# Patient Record
Sex: Female | Born: 1940
Health system: Southern US, Community
[De-identification: ages and names within clinical notes are randomized; demographics above are authoritative.]

## PROBLEM LIST (undated history)

## (undated) DIAGNOSIS — M1990 Unspecified osteoarthritis, unspecified site: Secondary | ICD-10-CM

## (undated) DIAGNOSIS — F419 Anxiety disorder, unspecified: Secondary | ICD-10-CM

## (undated) DIAGNOSIS — M8580 Other specified disorders of bone density and structure, unspecified site: Secondary | ICD-10-CM

## (undated) DIAGNOSIS — I10 Essential (primary) hypertension: Secondary | ICD-10-CM

## (undated) DIAGNOSIS — E871 Hypo-osmolality and hyponatremia: Secondary | ICD-10-CM

## (undated) DIAGNOSIS — Z9289 Personal history of other medical treatment: Secondary | ICD-10-CM

## (undated) DIAGNOSIS — K219 Gastro-esophageal reflux disease without esophagitis: Secondary | ICD-10-CM

## (undated) DIAGNOSIS — E785 Hyperlipidemia, unspecified: Secondary | ICD-10-CM

## (undated) DIAGNOSIS — I110 Hypertensive heart disease with heart failure: Secondary | ICD-10-CM

## (undated) DIAGNOSIS — I5032 Chronic diastolic (congestive) heart failure: Secondary | ICD-10-CM

## (undated) DIAGNOSIS — I340 Nonrheumatic mitral (valve) insufficiency: Secondary | ICD-10-CM

## (undated) DIAGNOSIS — I371 Nonrheumatic pulmonary valve insufficiency: Secondary | ICD-10-CM

## (undated) DIAGNOSIS — I350 Nonrheumatic aortic (valve) stenosis: Secondary | ICD-10-CM

## (undated) HISTORY — DX: Chronic diastolic (congestive) heart failure: I50.32

## (undated) HISTORY — DX: Gastro-esophageal reflux disease without esophagitis: K21.9

## (undated) HISTORY — DX: Nonrheumatic pulmonary valve insufficiency: I37.1

## (undated) HISTORY — PX: WRIST SURGERY: SHX841

## (undated) HISTORY — DX: Hyperlipidemia, unspecified: E78.5

## (undated) HISTORY — DX: Personal history of other medical treatment: Z92.89

## (undated) HISTORY — DX: Anxiety disorder, unspecified: F41.9

## (undated) HISTORY — PX: MENISECTOMY: SHX5181

## (undated) HISTORY — DX: Hypertensive heart disease with heart failure: I11.0

## (undated) HISTORY — DX: Nonrheumatic aortic (valve) stenosis: I35.0

## (undated) HISTORY — PX: KIDNEY SURGERY: SHX687

## (undated) HISTORY — DX: Other specified disorders of bone density and structure, unspecified site: M85.80

## (undated) HISTORY — DX: Essential (primary) hypertension: I10

## (undated) HISTORY — PX: OTHER SURGICAL HISTORY: SHX169

## (undated) HISTORY — PX: KNEE SURGERY: SHX244

## (undated) HISTORY — DX: Hypo-osmolality and hyponatremia: E87.1

## (undated) HISTORY — DX: Unspecified osteoarthritis, unspecified site: M19.90

## (undated) HISTORY — DX: Nonrheumatic mitral (valve) insufficiency: I34.0

---

## 1998-04-14 ENCOUNTER — Encounter: Admission: RE | Admit: 1998-04-14 | Discharge: 1998-04-14 | Payer: Self-pay | Admitting: Family Medicine

## 1998-05-07 ENCOUNTER — Encounter: Admission: RE | Admit: 1998-05-07 | Discharge: 1998-05-07 | Payer: Self-pay | Admitting: Family Medicine

## 1998-05-26 ENCOUNTER — Encounter: Admission: RE | Admit: 1998-05-26 | Discharge: 1998-05-26 | Payer: Self-pay | Admitting: Family Medicine

## 1998-05-27 ENCOUNTER — Encounter: Admission: RE | Admit: 1998-05-27 | Discharge: 1998-05-27 | Payer: Self-pay | Admitting: Family Medicine

## 1998-05-29 ENCOUNTER — Encounter: Admission: RE | Admit: 1998-05-29 | Discharge: 1998-05-29 | Payer: Self-pay | Admitting: Family Medicine

## 1998-06-05 ENCOUNTER — Encounter: Admission: RE | Admit: 1998-06-05 | Discharge: 1998-06-05 | Payer: Self-pay | Admitting: Sports Medicine

## 1998-06-24 ENCOUNTER — Encounter: Admission: RE | Admit: 1998-06-24 | Discharge: 1998-06-24 | Payer: Self-pay | Admitting: Family Medicine

## 1998-07-01 ENCOUNTER — Encounter: Admission: RE | Admit: 1998-07-01 | Discharge: 1998-07-01 | Payer: Self-pay | Admitting: Family Medicine

## 1998-07-15 ENCOUNTER — Encounter: Admission: RE | Admit: 1998-07-15 | Discharge: 1998-07-15 | Payer: Self-pay | Admitting: Sports Medicine

## 1998-07-17 ENCOUNTER — Encounter: Admission: RE | Admit: 1998-07-17 | Discharge: 1998-07-17 | Payer: Self-pay | Admitting: Family Medicine

## 1998-07-22 ENCOUNTER — Encounter: Admission: RE | Admit: 1998-07-22 | Discharge: 1998-07-22 | Payer: Self-pay | Admitting: Family Medicine

## 1998-09-08 ENCOUNTER — Encounter: Admission: RE | Admit: 1998-09-08 | Discharge: 1998-09-08 | Payer: Self-pay | Admitting: Family Medicine

## 1998-10-08 ENCOUNTER — Encounter: Admission: RE | Admit: 1998-10-08 | Discharge: 1998-10-08 | Payer: Self-pay | Admitting: Family Medicine

## 1998-10-29 ENCOUNTER — Encounter: Admission: RE | Admit: 1998-10-29 | Discharge: 1998-10-29 | Payer: Self-pay | Admitting: Family Medicine

## 1998-12-03 ENCOUNTER — Encounter: Admission: RE | Admit: 1998-12-03 | Discharge: 1998-12-03 | Payer: Self-pay | Admitting: Family Medicine

## 1998-12-17 ENCOUNTER — Encounter: Admission: RE | Admit: 1998-12-17 | Discharge: 1998-12-17 | Payer: Self-pay | Admitting: Family Medicine

## 1999-01-30 ENCOUNTER — Encounter: Admission: RE | Admit: 1999-01-30 | Discharge: 1999-01-30 | Payer: Self-pay | Admitting: Family Medicine

## 1999-02-13 ENCOUNTER — Encounter: Admission: RE | Admit: 1999-02-13 | Discharge: 1999-02-13 | Payer: Self-pay | Admitting: Family Medicine

## 1999-02-23 ENCOUNTER — Encounter: Admission: RE | Admit: 1999-02-23 | Discharge: 1999-02-23 | Payer: Self-pay | Admitting: Family Medicine

## 1999-04-03 ENCOUNTER — Encounter: Admission: RE | Admit: 1999-04-03 | Discharge: 1999-04-03 | Payer: Self-pay | Admitting: Sports Medicine

## 1999-04-16 ENCOUNTER — Encounter: Admission: RE | Admit: 1999-04-16 | Discharge: 1999-04-16 | Payer: Self-pay | Admitting: Family Medicine

## 1999-05-13 ENCOUNTER — Encounter: Admission: RE | Admit: 1999-05-13 | Discharge: 1999-05-13 | Payer: Self-pay | Admitting: Family Medicine

## 1999-06-03 ENCOUNTER — Encounter: Admission: RE | Admit: 1999-06-03 | Discharge: 1999-06-03 | Payer: Self-pay | Admitting: Family Medicine

## 1999-09-10 ENCOUNTER — Encounter: Admission: RE | Admit: 1999-09-10 | Discharge: 1999-09-10 | Payer: Self-pay | Admitting: Family Medicine

## 1999-10-29 ENCOUNTER — Encounter: Admission: RE | Admit: 1999-10-29 | Discharge: 1999-10-29 | Payer: Self-pay | Admitting: Family Medicine

## 1999-11-18 ENCOUNTER — Encounter: Admission: RE | Admit: 1999-11-18 | Discharge: 1999-11-18 | Payer: Self-pay | Admitting: Family Medicine

## 1999-11-27 ENCOUNTER — Encounter: Admission: RE | Admit: 1999-11-27 | Discharge: 1999-11-27 | Payer: Self-pay | Admitting: Family Medicine

## 1999-12-07 ENCOUNTER — Encounter: Admission: RE | Admit: 1999-12-07 | Discharge: 1999-12-07 | Payer: Self-pay | Admitting: Family Medicine

## 2000-01-14 ENCOUNTER — Ambulatory Visit (HOSPITAL_COMMUNITY): Admission: RE | Admit: 2000-01-14 | Discharge: 2000-01-14 | Payer: Self-pay | Admitting: Family Medicine

## 2000-01-14 ENCOUNTER — Encounter: Admission: RE | Admit: 2000-01-14 | Discharge: 2000-01-14 | Payer: Self-pay | Admitting: Family Medicine

## 2000-01-25 ENCOUNTER — Encounter: Admission: RE | Admit: 2000-01-25 | Discharge: 2000-01-25 | Payer: Self-pay | Admitting: Family Medicine

## 2000-03-04 ENCOUNTER — Encounter: Admission: RE | Admit: 2000-03-04 | Discharge: 2000-03-04 | Payer: Self-pay | Admitting: Family Medicine

## 2000-03-22 ENCOUNTER — Encounter: Admission: RE | Admit: 2000-03-22 | Discharge: 2000-03-22 | Payer: Self-pay | Admitting: Sports Medicine

## 2000-03-29 ENCOUNTER — Encounter: Admission: RE | Admit: 2000-03-29 | Discharge: 2000-03-29 | Payer: Self-pay | Admitting: Family Medicine

## 2000-05-19 ENCOUNTER — Encounter: Admission: RE | Admit: 2000-05-19 | Discharge: 2000-05-19 | Payer: Self-pay | Admitting: Family Medicine

## 2000-06-24 ENCOUNTER — Encounter: Admission: RE | Admit: 2000-06-24 | Discharge: 2000-06-24 | Payer: Self-pay | Admitting: Family Medicine

## 2000-07-01 ENCOUNTER — Encounter: Admission: RE | Admit: 2000-07-01 | Discharge: 2000-07-01 | Payer: Self-pay | Admitting: Family Medicine

## 2000-08-18 ENCOUNTER — Encounter: Payer: Self-pay | Admitting: *Deleted

## 2000-08-18 ENCOUNTER — Encounter: Admission: RE | Admit: 2000-08-18 | Discharge: 2000-08-18 | Payer: Self-pay | Admitting: *Deleted

## 2000-09-20 ENCOUNTER — Encounter: Admission: RE | Admit: 2000-09-20 | Discharge: 2000-09-20 | Payer: Self-pay | Admitting: Family Medicine

## 2000-10-11 ENCOUNTER — Encounter: Admission: RE | Admit: 2000-10-11 | Discharge: 2000-10-11 | Payer: Self-pay | Admitting: Family Medicine

## 2000-11-15 ENCOUNTER — Encounter: Admission: RE | Admit: 2000-11-15 | Discharge: 2000-11-15 | Payer: Self-pay | Admitting: Family Medicine

## 2000-11-25 ENCOUNTER — Encounter: Admission: RE | Admit: 2000-11-25 | Discharge: 2000-11-25 | Payer: Self-pay | Admitting: Family Medicine

## 2001-01-16 ENCOUNTER — Encounter: Admission: RE | Admit: 2001-01-16 | Discharge: 2001-01-16 | Payer: Self-pay | Admitting: Family Medicine

## 2001-01-18 ENCOUNTER — Encounter: Admission: RE | Admit: 2001-01-18 | Discharge: 2001-01-18 | Payer: Self-pay | Admitting: Family Medicine

## 2001-02-20 ENCOUNTER — Encounter: Admission: RE | Admit: 2001-02-20 | Discharge: 2001-02-20 | Payer: Self-pay | Admitting: Family Medicine

## 2001-03-24 ENCOUNTER — Encounter: Admission: RE | Admit: 2001-03-24 | Discharge: 2001-03-24 | Payer: Self-pay | Admitting: Family Medicine

## 2001-04-28 ENCOUNTER — Encounter: Admission: RE | Admit: 2001-04-28 | Discharge: 2001-04-28 | Payer: Self-pay | Admitting: Family Medicine

## 2001-05-08 ENCOUNTER — Encounter: Admission: RE | Admit: 2001-05-08 | Discharge: 2001-05-08 | Payer: Self-pay | Admitting: Family Medicine

## 2001-07-25 ENCOUNTER — Encounter: Admission: RE | Admit: 2001-07-25 | Discharge: 2001-07-25 | Payer: Self-pay | Admitting: Family Medicine

## 2001-10-12 ENCOUNTER — Encounter: Admission: RE | Admit: 2001-10-12 | Discharge: 2001-10-12 | Payer: Self-pay | Admitting: Family Medicine

## 2001-10-19 ENCOUNTER — Encounter: Payer: Self-pay | Admitting: *Deleted

## 2001-10-19 ENCOUNTER — Encounter: Admission: RE | Admit: 2001-10-19 | Discharge: 2001-10-19 | Payer: Self-pay | Admitting: *Deleted

## 2002-01-03 ENCOUNTER — Ambulatory Visit (HOSPITAL_COMMUNITY): Admission: RE | Admit: 2002-01-03 | Discharge: 2002-01-03 | Payer: Self-pay | Admitting: Gastroenterology

## 2002-02-01 ENCOUNTER — Encounter: Admission: RE | Admit: 2002-02-01 | Discharge: 2002-02-01 | Payer: Self-pay | Admitting: Family Medicine

## 2002-02-23 ENCOUNTER — Encounter: Admission: RE | Admit: 2002-02-23 | Discharge: 2002-02-23 | Payer: Self-pay | Admitting: Family Medicine

## 2002-04-13 ENCOUNTER — Encounter: Admission: RE | Admit: 2002-04-13 | Discharge: 2002-04-13 | Payer: Self-pay | Admitting: Family Medicine

## 2002-04-19 ENCOUNTER — Ambulatory Visit (HOSPITAL_COMMUNITY): Admission: RE | Admit: 2002-04-19 | Discharge: 2002-04-19 | Payer: Self-pay | Admitting: Family Medicine

## 2002-04-19 ENCOUNTER — Encounter: Admission: RE | Admit: 2002-04-19 | Discharge: 2002-04-19 | Payer: Self-pay | Admitting: Family Medicine

## 2002-04-19 ENCOUNTER — Encounter: Payer: Self-pay | Admitting: Sports Medicine

## 2002-04-19 ENCOUNTER — Encounter: Admission: RE | Admit: 2002-04-19 | Discharge: 2002-04-19 | Payer: Self-pay | Admitting: Sports Medicine

## 2002-06-08 ENCOUNTER — Encounter: Admission: RE | Admit: 2002-06-08 | Discharge: 2002-06-08 | Payer: Self-pay | Admitting: Family Medicine

## 2002-08-13 ENCOUNTER — Encounter: Admission: RE | Admit: 2002-08-13 | Discharge: 2002-08-13 | Payer: Self-pay | Admitting: Family Medicine

## 2002-08-23 ENCOUNTER — Encounter: Admission: RE | Admit: 2002-08-23 | Discharge: 2002-08-23 | Payer: Self-pay | Admitting: Family Medicine

## 2002-09-04 ENCOUNTER — Encounter: Admission: RE | Admit: 2002-09-04 | Discharge: 2002-09-04 | Payer: Self-pay | Admitting: Family Medicine

## 2002-11-02 ENCOUNTER — Encounter: Admission: RE | Admit: 2002-11-02 | Discharge: 2002-11-02 | Payer: Self-pay | Admitting: Family Medicine

## 2002-11-17 ENCOUNTER — Emergency Department (HOSPITAL_COMMUNITY): Admission: EM | Admit: 2002-11-17 | Discharge: 2002-11-17 | Payer: Self-pay | Admitting: Emergency Medicine

## 2002-11-17 ENCOUNTER — Encounter: Payer: Self-pay | Admitting: Emergency Medicine

## 2003-04-12 ENCOUNTER — Encounter: Admission: RE | Admit: 2003-04-12 | Discharge: 2003-04-12 | Payer: Self-pay | Admitting: Family Medicine

## 2003-05-03 ENCOUNTER — Encounter: Admission: RE | Admit: 2003-05-03 | Discharge: 2003-05-03 | Payer: Self-pay | Admitting: Family Medicine

## 2003-06-07 ENCOUNTER — Encounter: Admission: RE | Admit: 2003-06-07 | Discharge: 2003-06-07 | Payer: Self-pay | Admitting: Family Medicine

## 2003-06-28 ENCOUNTER — Encounter: Admission: RE | Admit: 2003-06-28 | Discharge: 2003-06-28 | Payer: Self-pay | Admitting: Sports Medicine

## 2003-07-31 ENCOUNTER — Encounter: Admission: RE | Admit: 2003-07-31 | Discharge: 2003-07-31 | Payer: Self-pay | Admitting: Family Medicine

## 2003-08-14 ENCOUNTER — Encounter: Admission: RE | Admit: 2003-08-14 | Discharge: 2003-08-14 | Payer: Self-pay | Admitting: Family Medicine

## 2003-09-02 ENCOUNTER — Encounter: Admission: RE | Admit: 2003-09-02 | Discharge: 2003-09-02 | Payer: Self-pay | Admitting: Family Medicine

## 2003-09-03 ENCOUNTER — Encounter: Admission: RE | Admit: 2003-09-03 | Discharge: 2003-09-03 | Payer: Self-pay | Admitting: Family Medicine

## 2003-09-05 ENCOUNTER — Encounter: Admission: RE | Admit: 2003-09-05 | Discharge: 2003-09-05 | Payer: Self-pay | Admitting: Family Medicine

## 2003-09-09 ENCOUNTER — Encounter: Admission: RE | Admit: 2003-09-09 | Discharge: 2003-09-09 | Payer: Self-pay | Admitting: Sports Medicine

## 2003-09-18 ENCOUNTER — Encounter: Admission: RE | Admit: 2003-09-18 | Discharge: 2003-09-18 | Payer: Self-pay | Admitting: Family Medicine

## 2003-09-25 ENCOUNTER — Encounter: Admission: RE | Admit: 2003-09-25 | Discharge: 2003-09-25 | Payer: Self-pay | Admitting: Family Medicine

## 2003-10-03 ENCOUNTER — Encounter: Admission: RE | Admit: 2003-10-03 | Discharge: 2003-10-03 | Payer: Self-pay | Admitting: Family Medicine

## 2003-10-16 ENCOUNTER — Encounter: Admission: RE | Admit: 2003-10-16 | Discharge: 2003-10-16 | Payer: Self-pay | Admitting: Family Medicine

## 2003-10-22 ENCOUNTER — Encounter: Admission: RE | Admit: 2003-10-22 | Discharge: 2003-10-22 | Payer: Self-pay | Admitting: Sports Medicine

## 2003-11-07 ENCOUNTER — Encounter: Admission: RE | Admit: 2003-11-07 | Discharge: 2003-11-07 | Payer: Self-pay | Admitting: Family Medicine

## 2003-11-19 ENCOUNTER — Encounter: Admission: RE | Admit: 2003-11-19 | Discharge: 2003-11-19 | Payer: Self-pay | Admitting: Family Medicine

## 2003-11-26 ENCOUNTER — Encounter: Admission: RE | Admit: 2003-11-26 | Discharge: 2003-11-26 | Payer: Self-pay | Admitting: Sports Medicine

## 2003-12-05 ENCOUNTER — Encounter: Admission: RE | Admit: 2003-12-05 | Discharge: 2003-12-05 | Payer: Self-pay | Admitting: Family Medicine

## 2004-01-14 ENCOUNTER — Encounter: Admission: RE | Admit: 2004-01-14 | Discharge: 2004-01-14 | Payer: Self-pay | Admitting: Sports Medicine

## 2004-02-05 ENCOUNTER — Encounter: Admission: RE | Admit: 2004-02-05 | Discharge: 2004-02-05 | Payer: Self-pay | Admitting: Sports Medicine

## 2004-03-02 ENCOUNTER — Encounter: Admission: RE | Admit: 2004-03-02 | Discharge: 2004-03-02 | Payer: Self-pay | Admitting: Sports Medicine

## 2004-04-08 ENCOUNTER — Encounter: Admission: RE | Admit: 2004-04-08 | Discharge: 2004-04-08 | Payer: Self-pay | Admitting: Family Medicine

## 2004-05-06 ENCOUNTER — Ambulatory Visit (HOSPITAL_COMMUNITY): Admission: RE | Admit: 2004-05-06 | Discharge: 2004-05-06 | Payer: Self-pay | Admitting: Sports Medicine

## 2004-05-06 ENCOUNTER — Encounter: Admission: RE | Admit: 2004-05-06 | Discharge: 2004-05-06 | Payer: Self-pay | Admitting: Sports Medicine

## 2004-05-29 ENCOUNTER — Ambulatory Visit (HOSPITAL_COMMUNITY): Admission: RE | Admit: 2004-05-29 | Discharge: 2004-05-29 | Payer: Self-pay | Admitting: Sports Medicine

## 2004-07-20 ENCOUNTER — Encounter: Admission: RE | Admit: 2004-07-20 | Discharge: 2004-07-20 | Payer: Self-pay | Admitting: Family Medicine

## 2004-10-05 ENCOUNTER — Ambulatory Visit: Payer: Self-pay | Admitting: Family Medicine

## 2004-10-15 ENCOUNTER — Ambulatory Visit: Payer: Self-pay | Admitting: Family Medicine

## 2004-10-26 ENCOUNTER — Ambulatory Visit: Payer: Self-pay | Admitting: Sports Medicine

## 2004-11-16 ENCOUNTER — Ambulatory Visit: Payer: Self-pay | Admitting: Family Medicine

## 2004-11-30 ENCOUNTER — Ambulatory Visit: Payer: Self-pay | Admitting: Sports Medicine

## 2004-12-30 ENCOUNTER — Ambulatory Visit: Payer: Self-pay | Admitting: Family Medicine

## 2005-01-11 ENCOUNTER — Ambulatory Visit: Payer: Self-pay | Admitting: Family Medicine

## 2005-01-20 ENCOUNTER — Ambulatory Visit: Payer: Self-pay | Admitting: Family Medicine

## 2005-02-19 ENCOUNTER — Ambulatory Visit: Payer: Self-pay | Admitting: Family Medicine

## 2005-04-19 ENCOUNTER — Ambulatory Visit: Payer: Self-pay | Admitting: Family Medicine

## 2005-05-26 ENCOUNTER — Encounter: Admission: RE | Admit: 2005-05-26 | Discharge: 2005-05-26 | Payer: Self-pay | Admitting: Family Medicine

## 2005-05-26 ENCOUNTER — Ambulatory Visit: Payer: Self-pay | Admitting: Family Medicine

## 2005-07-19 ENCOUNTER — Ambulatory Visit: Payer: Self-pay | Admitting: Family Medicine

## 2005-09-17 ENCOUNTER — Encounter: Admission: RE | Admit: 2005-09-17 | Discharge: 2005-09-17 | Payer: Self-pay | Admitting: Sports Medicine

## 2005-09-20 ENCOUNTER — Ambulatory Visit: Payer: Self-pay | Admitting: Family Medicine

## 2005-09-30 ENCOUNTER — Ambulatory Visit: Payer: Self-pay | Admitting: Family Medicine

## 2005-11-05 ENCOUNTER — Encounter: Admission: RE | Admit: 2005-11-05 | Discharge: 2005-11-05 | Payer: Self-pay | Admitting: Sports Medicine

## 2005-11-24 ENCOUNTER — Encounter (INDEPENDENT_AMBULATORY_CARE_PROVIDER_SITE_OTHER): Payer: Self-pay | Admitting: *Deleted

## 2005-11-29 ENCOUNTER — Ambulatory Visit: Payer: Self-pay | Admitting: Family Medicine

## 2006-02-28 ENCOUNTER — Ambulatory Visit: Payer: Self-pay | Admitting: Family Medicine

## 2006-03-02 ENCOUNTER — Ambulatory Visit: Payer: Self-pay | Admitting: Family Medicine

## 2006-03-30 ENCOUNTER — Ambulatory Visit: Payer: Self-pay | Admitting: Family Medicine

## 2006-04-22 ENCOUNTER — Ambulatory Visit: Payer: Self-pay | Admitting: Family Medicine

## 2006-05-06 ENCOUNTER — Ambulatory Visit: Payer: Self-pay | Admitting: Family Medicine

## 2006-05-12 ENCOUNTER — Ambulatory Visit: Payer: Self-pay | Admitting: Sports Medicine

## 2006-05-27 ENCOUNTER — Ambulatory Visit: Payer: Self-pay | Admitting: Family Medicine

## 2006-06-01 ENCOUNTER — Ambulatory Visit: Payer: Self-pay | Admitting: Family Medicine

## 2006-07-29 ENCOUNTER — Ambulatory Visit: Payer: Self-pay | Admitting: Family Medicine

## 2006-09-14 ENCOUNTER — Ambulatory Visit: Payer: Self-pay | Admitting: Family Medicine

## 2006-09-20 ENCOUNTER — Ambulatory Visit: Payer: Self-pay | Admitting: Sports Medicine

## 2006-10-05 ENCOUNTER — Ambulatory Visit: Payer: Self-pay | Admitting: Family Medicine

## 2006-10-06 ENCOUNTER — Ambulatory Visit: Payer: Self-pay | Admitting: Family Medicine

## 2006-10-25 ENCOUNTER — Ambulatory Visit: Payer: Self-pay | Admitting: Family Medicine

## 2006-11-08 ENCOUNTER — Ambulatory Visit: Payer: Self-pay | Admitting: Family Medicine

## 2007-01-04 ENCOUNTER — Ambulatory Visit: Payer: Self-pay | Admitting: Sports Medicine

## 2007-02-16 DIAGNOSIS — E785 Hyperlipidemia, unspecified: Secondary | ICD-10-CM | POA: Insufficient documentation

## 2007-02-16 DIAGNOSIS — E1169 Type 2 diabetes mellitus with other specified complication: Secondary | ICD-10-CM | POA: Insufficient documentation

## 2007-02-16 DIAGNOSIS — E669 Obesity, unspecified: Secondary | ICD-10-CM

## 2007-02-16 DIAGNOSIS — E119 Type 2 diabetes mellitus without complications: Secondary | ICD-10-CM | POA: Insufficient documentation

## 2007-02-16 DIAGNOSIS — M810 Age-related osteoporosis without current pathological fracture: Secondary | ICD-10-CM

## 2007-02-16 DIAGNOSIS — K219 Gastro-esophageal reflux disease without esophagitis: Secondary | ICD-10-CM | POA: Insufficient documentation

## 2007-02-16 DIAGNOSIS — M109 Gout, unspecified: Secondary | ICD-10-CM | POA: Insufficient documentation

## 2007-02-16 DIAGNOSIS — M8580 Other specified disorders of bone density and structure, unspecified site: Secondary | ICD-10-CM | POA: Insufficient documentation

## 2007-02-16 DIAGNOSIS — M170 Bilateral primary osteoarthritis of knee: Secondary | ICD-10-CM | POA: Insufficient documentation

## 2007-02-16 DIAGNOSIS — M1990 Unspecified osteoarthritis, unspecified site: Secondary | ICD-10-CM

## 2007-02-16 DIAGNOSIS — Z780 Asymptomatic menopausal state: Secondary | ICD-10-CM | POA: Insufficient documentation

## 2007-02-16 DIAGNOSIS — N952 Postmenopausal atrophic vaginitis: Secondary | ICD-10-CM | POA: Insufficient documentation

## 2007-02-16 HISTORY — DX: Obesity, unspecified: E66.9

## 2007-02-17 ENCOUNTER — Encounter (INDEPENDENT_AMBULATORY_CARE_PROVIDER_SITE_OTHER): Payer: Self-pay | Admitting: *Deleted

## 2007-04-04 ENCOUNTER — Telehealth: Payer: Self-pay | Admitting: *Deleted

## 2007-04-12 ENCOUNTER — Telehealth (INDEPENDENT_AMBULATORY_CARE_PROVIDER_SITE_OTHER): Payer: Self-pay | Admitting: Family Medicine

## 2007-04-19 ENCOUNTER — Ambulatory Visit: Payer: Self-pay | Admitting: Family Medicine

## 2007-05-05 ENCOUNTER — Ambulatory Visit: Payer: Self-pay | Admitting: Family Medicine

## 2007-05-17 ENCOUNTER — Encounter (INDEPENDENT_AMBULATORY_CARE_PROVIDER_SITE_OTHER): Payer: Self-pay | Admitting: Family Medicine

## 2007-05-17 ENCOUNTER — Telehealth (INDEPENDENT_AMBULATORY_CARE_PROVIDER_SITE_OTHER): Payer: Self-pay | Admitting: Family Medicine

## 2007-05-18 ENCOUNTER — Encounter (INDEPENDENT_AMBULATORY_CARE_PROVIDER_SITE_OTHER): Payer: Self-pay | Admitting: *Deleted

## 2007-05-19 ENCOUNTER — Encounter (INDEPENDENT_AMBULATORY_CARE_PROVIDER_SITE_OTHER): Payer: Self-pay | Admitting: Family Medicine

## 2007-05-26 ENCOUNTER — Ambulatory Visit: Payer: Self-pay | Admitting: Sports Medicine

## 2007-05-26 DIAGNOSIS — C4490 Unspecified malignant neoplasm of skin, unspecified: Secondary | ICD-10-CM

## 2007-05-26 HISTORY — DX: Unspecified malignant neoplasm of skin, unspecified: C44.90

## 2007-06-02 ENCOUNTER — Telehealth (INDEPENDENT_AMBULATORY_CARE_PROVIDER_SITE_OTHER): Payer: Self-pay | Admitting: Family Medicine

## 2007-06-08 ENCOUNTER — Telehealth (INDEPENDENT_AMBULATORY_CARE_PROVIDER_SITE_OTHER): Payer: Self-pay | Admitting: *Deleted

## 2007-06-09 ENCOUNTER — Telehealth (INDEPENDENT_AMBULATORY_CARE_PROVIDER_SITE_OTHER): Payer: Self-pay | Admitting: *Deleted

## 2007-06-12 ENCOUNTER — Encounter (INDEPENDENT_AMBULATORY_CARE_PROVIDER_SITE_OTHER): Payer: Self-pay | Admitting: Family Medicine

## 2007-06-12 ENCOUNTER — Ambulatory Visit: Payer: Self-pay | Admitting: Sports Medicine

## 2007-06-12 LAB — CONVERTED CEMR LAB
Albumin: 4.5 g/dL (ref 3.5–5.2)
Alkaline Phosphatase: 61 units/L (ref 39–117)
BUN: 19 mg/dL (ref 6–23)
Calcium: 9.5 mg/dL (ref 8.4–10.5)
Glucose, Bld: 115 mg/dL — ABNORMAL HIGH (ref 70–99)
Potassium: 4.7 meq/L (ref 3.5–5.3)

## 2007-06-30 ENCOUNTER — Encounter (INDEPENDENT_AMBULATORY_CARE_PROVIDER_SITE_OTHER): Payer: Self-pay | Admitting: Family Medicine

## 2007-06-30 ENCOUNTER — Ambulatory Visit: Payer: Self-pay | Admitting: Family Medicine

## 2007-07-05 ENCOUNTER — Ambulatory Visit (HOSPITAL_COMMUNITY): Admission: RE | Admit: 2007-07-05 | Discharge: 2007-07-05 | Payer: Self-pay | Admitting: Family Medicine

## 2007-07-05 ENCOUNTER — Telehealth: Payer: Self-pay | Admitting: *Deleted

## 2007-07-19 ENCOUNTER — Telehealth (INDEPENDENT_AMBULATORY_CARE_PROVIDER_SITE_OTHER): Payer: Self-pay | Admitting: *Deleted

## 2007-08-02 ENCOUNTER — Encounter (INDEPENDENT_AMBULATORY_CARE_PROVIDER_SITE_OTHER): Payer: Self-pay | Admitting: Family Medicine

## 2007-08-08 ENCOUNTER — Encounter (INDEPENDENT_AMBULATORY_CARE_PROVIDER_SITE_OTHER): Payer: Self-pay | Admitting: Family Medicine

## 2007-08-10 ENCOUNTER — Telehealth: Payer: Self-pay | Admitting: *Deleted

## 2007-08-10 ENCOUNTER — Encounter (INDEPENDENT_AMBULATORY_CARE_PROVIDER_SITE_OTHER): Payer: Self-pay | Admitting: Family Medicine

## 2007-09-12 ENCOUNTER — Encounter: Payer: Self-pay | Admitting: *Deleted

## 2007-09-13 ENCOUNTER — Ambulatory Visit: Payer: Self-pay | Admitting: Family Medicine

## 2007-09-13 DIAGNOSIS — K224 Dyskinesia of esophagus: Secondary | ICD-10-CM

## 2007-09-13 HISTORY — DX: Dyskinesia of esophagus: K22.4

## 2007-09-13 LAB — CONVERTED CEMR LAB: Hgb A1c MFr Bld: 5.6 %

## 2007-10-09 ENCOUNTER — Ambulatory Visit: Payer: Self-pay | Admitting: Family Medicine

## 2007-10-09 ENCOUNTER — Encounter (INDEPENDENT_AMBULATORY_CARE_PROVIDER_SITE_OTHER): Payer: Self-pay | Admitting: Family Medicine

## 2007-10-09 LAB — CONVERTED CEMR LAB
CO2: 28 meq/L (ref 19–32)
Calcium: 9.9 mg/dL (ref 8.4–10.5)
Creatinine, Ser: 0.86 mg/dL (ref 0.40–1.20)
Glucose, Bld: 139 mg/dL — ABNORMAL HIGH (ref 70–99)
HCT: 40.8 % (ref 36.0–46.0)
MCHC: 34.6 g/dL (ref 30.0–36.0)
MCV: 90.1 fL (ref 78.0–100.0)
Platelets: 262 10*3/uL (ref 150–400)
RBC: 4.53 M/uL (ref 3.87–5.11)
WBC: 7 10*3/uL (ref 4.0–10.5)

## 2007-10-10 ENCOUNTER — Encounter (INDEPENDENT_AMBULATORY_CARE_PROVIDER_SITE_OTHER): Payer: Self-pay | Admitting: Family Medicine

## 2007-11-01 ENCOUNTER — Encounter (INDEPENDENT_AMBULATORY_CARE_PROVIDER_SITE_OTHER): Payer: Self-pay | Admitting: Family Medicine

## 2007-11-15 ENCOUNTER — Telehealth: Payer: Self-pay | Admitting: *Deleted

## 2007-11-15 ENCOUNTER — Ambulatory Visit: Payer: Self-pay | Admitting: Family Medicine

## 2007-11-22 ENCOUNTER — Telehealth (INDEPENDENT_AMBULATORY_CARE_PROVIDER_SITE_OTHER): Payer: Self-pay | Admitting: Family Medicine

## 2007-12-08 ENCOUNTER — Telehealth (INDEPENDENT_AMBULATORY_CARE_PROVIDER_SITE_OTHER): Payer: Self-pay | Admitting: Family Medicine

## 2008-01-12 ENCOUNTER — Ambulatory Visit: Payer: Self-pay | Admitting: Family Medicine

## 2008-02-05 ENCOUNTER — Telehealth: Payer: Self-pay | Admitting: *Deleted

## 2008-02-06 ENCOUNTER — Emergency Department (HOSPITAL_COMMUNITY): Admission: EM | Admit: 2008-02-06 | Discharge: 2008-02-06 | Payer: Self-pay | Admitting: Emergency Medicine

## 2008-02-13 ENCOUNTER — Encounter (INDEPENDENT_AMBULATORY_CARE_PROVIDER_SITE_OTHER): Payer: Self-pay | Admitting: Pharmacist

## 2008-02-13 ENCOUNTER — Ambulatory Visit: Payer: Self-pay | Admitting: Family Medicine

## 2008-03-05 ENCOUNTER — Ambulatory Visit: Payer: Self-pay | Admitting: Family Medicine

## 2008-03-06 ENCOUNTER — Telehealth (INDEPENDENT_AMBULATORY_CARE_PROVIDER_SITE_OTHER): Payer: Self-pay | Admitting: Family Medicine

## 2008-03-11 ENCOUNTER — Emergency Department (HOSPITAL_COMMUNITY): Admission: EM | Admit: 2008-03-11 | Discharge: 2008-03-11 | Payer: Self-pay | Admitting: Emergency Medicine

## 2008-03-15 ENCOUNTER — Ambulatory Visit: Payer: Self-pay | Admitting: Family Medicine

## 2008-03-18 ENCOUNTER — Telehealth (INDEPENDENT_AMBULATORY_CARE_PROVIDER_SITE_OTHER): Payer: Self-pay | Admitting: Family Medicine

## 2008-03-26 ENCOUNTER — Ambulatory Visit: Payer: Self-pay | Admitting: Sports Medicine

## 2008-04-02 ENCOUNTER — Ambulatory Visit: Payer: Self-pay | Admitting: Family Medicine

## 2008-05-07 ENCOUNTER — Ambulatory Visit: Payer: Self-pay | Admitting: Family Medicine

## 2008-05-10 ENCOUNTER — Encounter (INDEPENDENT_AMBULATORY_CARE_PROVIDER_SITE_OTHER): Payer: Self-pay | Admitting: Family Medicine

## 2008-05-10 ENCOUNTER — Ambulatory Visit: Payer: Self-pay | Admitting: Family Medicine

## 2008-05-10 LAB — CONVERTED CEMR LAB
BUN: 25 mg/dL — ABNORMAL HIGH (ref 6–23)
CO2: 29 meq/L (ref 19–32)
Calcium: 9.5 mg/dL (ref 8.4–10.5)
Chloride: 101 meq/L (ref 96–112)
Cholesterol: 177 mg/dL (ref 0–200)
Creatinine, Ser: 0.85 mg/dL (ref 0.40–1.20)
Glucose, Bld: 121 mg/dL — ABNORMAL HIGH (ref 70–99)
HDL: 45 mg/dL (ref >39)
Protein, U semiquant: NEGATIVE
Total CHOL/HDL Ratio: 3.9
Triglycerides: 132 mg/dL (ref <150)
Urobilinogen, UA: 0.2
WBC Urine, dipstick: NEGATIVE

## 2008-05-14 ENCOUNTER — Encounter (INDEPENDENT_AMBULATORY_CARE_PROVIDER_SITE_OTHER): Payer: Self-pay | Admitting: Family Medicine

## 2008-06-04 ENCOUNTER — Ambulatory Visit: Payer: Self-pay | Admitting: Family Medicine

## 2008-06-06 ENCOUNTER — Telehealth: Payer: Self-pay | Admitting: *Deleted

## 2008-06-19 ENCOUNTER — Encounter: Admission: RE | Admit: 2008-06-19 | Discharge: 2008-06-19 | Payer: Self-pay | Admitting: Family Medicine

## 2008-07-02 ENCOUNTER — Encounter (INDEPENDENT_AMBULATORY_CARE_PROVIDER_SITE_OTHER): Payer: Self-pay | Admitting: Family Medicine

## 2008-07-05 ENCOUNTER — Ambulatory Visit: Payer: Self-pay | Admitting: Family Medicine

## 2008-07-12 ENCOUNTER — Ambulatory Visit: Payer: Self-pay | Admitting: Family Medicine

## 2008-07-12 ENCOUNTER — Telehealth: Payer: Self-pay | Admitting: *Deleted

## 2008-07-12 ENCOUNTER — Ambulatory Visit (HOSPITAL_COMMUNITY): Admission: RE | Admit: 2008-07-12 | Discharge: 2008-07-12 | Payer: Self-pay | Admitting: Family Medicine

## 2008-07-12 ENCOUNTER — Encounter: Payer: Self-pay | Admitting: Family Medicine

## 2008-07-12 LAB — CONVERTED CEMR LAB
CO2: 27 meq/L (ref 19–32)
Calcium: 9.4 mg/dL (ref 8.4–10.5)
Creatinine, Ser: 0.84 mg/dL (ref 0.40–1.20)
Glucose, Bld: 158 mg/dL — ABNORMAL HIGH (ref 70–99)
Sodium: 142 meq/L (ref 135–145)

## 2008-07-15 ENCOUNTER — Encounter (INDEPENDENT_AMBULATORY_CARE_PROVIDER_SITE_OTHER): Payer: Self-pay | Admitting: Family Medicine

## 2008-07-15 ENCOUNTER — Ambulatory Visit: Payer: Self-pay | Admitting: Family Medicine

## 2008-07-16 ENCOUNTER — Ambulatory Visit: Payer: Self-pay | Admitting: Family Medicine

## 2008-07-16 ENCOUNTER — Encounter (INDEPENDENT_AMBULATORY_CARE_PROVIDER_SITE_OTHER): Payer: Self-pay | Admitting: Family Medicine

## 2008-07-18 ENCOUNTER — Ambulatory Visit (HOSPITAL_COMMUNITY): Admission: RE | Admit: 2008-07-18 | Discharge: 2008-07-18 | Payer: Self-pay | Admitting: Family Medicine

## 2008-07-18 ENCOUNTER — Ambulatory Visit: Payer: Self-pay | Admitting: Internal Medicine

## 2008-07-18 ENCOUNTER — Encounter: Payer: Self-pay | Admitting: Family Medicine

## 2008-07-19 ENCOUNTER — Encounter (INDEPENDENT_AMBULATORY_CARE_PROVIDER_SITE_OTHER): Payer: Self-pay | Admitting: Family Medicine

## 2008-07-20 ENCOUNTER — Encounter: Admission: RE | Admit: 2008-07-20 | Discharge: 2008-07-20 | Payer: Self-pay | Admitting: Family Medicine

## 2008-07-23 ENCOUNTER — Ambulatory Visit: Payer: Self-pay | Admitting: Family Medicine

## 2008-08-21 ENCOUNTER — Encounter (INDEPENDENT_AMBULATORY_CARE_PROVIDER_SITE_OTHER): Payer: Self-pay | Admitting: Family Medicine

## 2008-08-21 ENCOUNTER — Ambulatory Visit: Payer: Self-pay | Admitting: Family Medicine

## 2008-08-27 ENCOUNTER — Encounter (INDEPENDENT_AMBULATORY_CARE_PROVIDER_SITE_OTHER): Payer: Self-pay | Admitting: Family Medicine

## 2008-08-27 LAB — CONVERTED CEMR LAB
BUN: 25 mg/dL — ABNORMAL HIGH (ref 6–23)
Chloride: 102 meq/L (ref 96–112)
Creatinine, Ser: 0.77 mg/dL (ref 0.40–1.20)

## 2008-09-23 ENCOUNTER — Encounter (INDEPENDENT_AMBULATORY_CARE_PROVIDER_SITE_OTHER): Payer: Self-pay | Admitting: Family Medicine

## 2008-10-17 ENCOUNTER — Telehealth: Payer: Self-pay | Admitting: *Deleted

## 2008-10-18 ENCOUNTER — Ambulatory Visit: Payer: Self-pay | Admitting: Family Medicine

## 2008-10-18 DIAGNOSIS — R011 Cardiac murmur, unspecified: Secondary | ICD-10-CM | POA: Insufficient documentation

## 2008-10-18 DIAGNOSIS — F419 Anxiety disorder, unspecified: Secondary | ICD-10-CM | POA: Insufficient documentation

## 2008-11-01 ENCOUNTER — Telehealth (INDEPENDENT_AMBULATORY_CARE_PROVIDER_SITE_OTHER): Payer: Self-pay | Admitting: Family Medicine

## 2008-12-06 ENCOUNTER — Telehealth: Payer: Self-pay | Admitting: *Deleted

## 2009-01-30 ENCOUNTER — Encounter (INDEPENDENT_AMBULATORY_CARE_PROVIDER_SITE_OTHER): Payer: Self-pay | Admitting: Family Medicine

## 2009-02-25 ENCOUNTER — Ambulatory Visit: Payer: Self-pay | Admitting: Family Medicine

## 2009-02-25 ENCOUNTER — Encounter (INDEPENDENT_AMBULATORY_CARE_PROVIDER_SITE_OTHER): Payer: Self-pay | Admitting: Family Medicine

## 2009-02-25 LAB — CONVERTED CEMR LAB: Hgb A1c MFr Bld: 6.3 %

## 2009-03-04 ENCOUNTER — Telehealth: Payer: Self-pay | Admitting: *Deleted

## 2009-03-25 ENCOUNTER — Ambulatory Visit: Payer: Self-pay | Admitting: Family Medicine

## 2009-04-21 ENCOUNTER — Telehealth (INDEPENDENT_AMBULATORY_CARE_PROVIDER_SITE_OTHER): Payer: Self-pay | Admitting: Family Medicine

## 2009-05-01 ENCOUNTER — Telehealth: Payer: Self-pay | Admitting: *Deleted

## 2009-05-20 ENCOUNTER — Telehealth: Payer: Self-pay | Admitting: *Deleted

## 2009-05-22 ENCOUNTER — Encounter (INDEPENDENT_AMBULATORY_CARE_PROVIDER_SITE_OTHER): Payer: Self-pay | Admitting: Family Medicine

## 2009-05-23 ENCOUNTER — Ambulatory Visit: Payer: Self-pay | Admitting: Family Medicine

## 2009-06-06 ENCOUNTER — Telehealth (INDEPENDENT_AMBULATORY_CARE_PROVIDER_SITE_OTHER): Payer: Self-pay | Admitting: Family Medicine

## 2009-06-13 ENCOUNTER — Encounter: Payer: Self-pay | Admitting: *Deleted

## 2009-06-26 ENCOUNTER — Ambulatory Visit: Payer: Self-pay | Admitting: Family Medicine

## 2009-07-04 ENCOUNTER — Ambulatory Visit: Payer: Self-pay | Admitting: Family Medicine

## 2009-07-04 ENCOUNTER — Encounter (INDEPENDENT_AMBULATORY_CARE_PROVIDER_SITE_OTHER): Payer: Self-pay | Admitting: Family Medicine

## 2009-07-07 ENCOUNTER — Encounter (INDEPENDENT_AMBULATORY_CARE_PROVIDER_SITE_OTHER): Payer: Self-pay | Admitting: Family Medicine

## 2009-07-07 LAB — CONVERTED CEMR LAB
AST: 15 units/L (ref 0–37)
Alkaline Phosphatase: 53 units/L (ref 39–117)
BUN: 21 mg/dL (ref 6–23)
Creatinine, Ser: 0.83 mg/dL (ref 0.40–1.20)
HCT: 40.4 % (ref 36.0–46.0)
HDL: 44 mg/dL (ref >39)
Hemoglobin: 14 g/dL (ref 12.0–15.0)
LDL Cholesterol: 115 mg/dL — ABNORMAL HIGH (ref 0–99)
MCHC: 34.7 g/dL (ref 30.0–36.0)
Potassium: 4.6 meq/L (ref 3.5–5.3)
RDW: 13.7 % (ref 11.5–15.5)
Total Bilirubin: 0.4 mg/dL (ref 0.3–1.2)
Total CHOL/HDL Ratio: 4.3
VLDL: 28 mg/dL (ref 0–40)
Vitamin B-12: 334 pg/mL (ref 211–911)

## 2009-07-14 ENCOUNTER — Telehealth (INDEPENDENT_AMBULATORY_CARE_PROVIDER_SITE_OTHER): Payer: Self-pay | Admitting: Pharmacist

## 2009-07-14 ENCOUNTER — Encounter (INDEPENDENT_AMBULATORY_CARE_PROVIDER_SITE_OTHER): Payer: Self-pay | Admitting: Pharmacist

## 2009-07-23 ENCOUNTER — Telehealth (INDEPENDENT_AMBULATORY_CARE_PROVIDER_SITE_OTHER): Payer: Self-pay | Admitting: Pharmacist

## 2009-07-25 ENCOUNTER — Encounter (INDEPENDENT_AMBULATORY_CARE_PROVIDER_SITE_OTHER): Payer: Self-pay | Admitting: Pharmacist

## 2009-08-12 ENCOUNTER — Ambulatory Visit: Payer: Self-pay | Admitting: Family Medicine

## 2009-08-12 DIAGNOSIS — K3184 Gastroparesis: Secondary | ICD-10-CM | POA: Insufficient documentation

## 2009-09-16 ENCOUNTER — Ambulatory Visit: Payer: Self-pay | Admitting: Family Medicine

## 2009-09-16 ENCOUNTER — Telehealth: Payer: Self-pay | Admitting: *Deleted

## 2009-09-16 DIAGNOSIS — G4700 Insomnia, unspecified: Secondary | ICD-10-CM | POA: Insufficient documentation

## 2009-09-16 LAB — CONVERTED CEMR LAB: Hgb A1c MFr Bld: 6.3 %

## 2009-10-07 ENCOUNTER — Telehealth: Payer: Self-pay | Admitting: Family Medicine

## 2009-10-10 ENCOUNTER — Telehealth: Payer: Self-pay | Admitting: *Deleted

## 2009-10-24 ENCOUNTER — Ambulatory Visit: Payer: Self-pay | Admitting: Family Medicine

## 2009-11-10 ENCOUNTER — Telehealth: Payer: Self-pay | Admitting: *Deleted

## 2009-11-18 ENCOUNTER — Encounter: Payer: Self-pay | Admitting: Family Medicine

## 2009-12-01 ENCOUNTER — Telehealth: Payer: Self-pay | Admitting: Family Medicine

## 2009-12-15 ENCOUNTER — Telehealth: Payer: Self-pay | Admitting: Family Medicine

## 2009-12-16 ENCOUNTER — Telehealth (INDEPENDENT_AMBULATORY_CARE_PROVIDER_SITE_OTHER): Payer: Self-pay | Admitting: *Deleted

## 2010-01-01 ENCOUNTER — Telehealth: Payer: Self-pay | Admitting: Family Medicine

## 2010-01-12 ENCOUNTER — Telehealth: Payer: Self-pay | Admitting: *Deleted

## 2010-01-13 ENCOUNTER — Telehealth (INDEPENDENT_AMBULATORY_CARE_PROVIDER_SITE_OTHER): Payer: Self-pay | Admitting: *Deleted

## 2010-02-02 ENCOUNTER — Encounter: Payer: Self-pay | Admitting: Family Medicine

## 2010-02-25 ENCOUNTER — Telehealth: Payer: Self-pay | Admitting: Family Medicine

## 2010-03-11 ENCOUNTER — Telehealth: Payer: Self-pay | Admitting: Family Medicine

## 2010-03-11 ENCOUNTER — Encounter: Payer: Self-pay | Admitting: Family Medicine

## 2010-03-11 ENCOUNTER — Ambulatory Visit: Payer: Self-pay | Admitting: Family Medicine

## 2010-03-11 LAB — CONVERTED CEMR LAB
AST: 17 units/L (ref 0–37)
Alkaline Phosphatase: 48 units/L (ref 39–117)
Direct LDL: 103 mg/dL — ABNORMAL HIGH
Glucose, Bld: 196 mg/dL — ABNORMAL HIGH (ref 70–99)
MCHC: 33.4 g/dL (ref 30.0–36.0)
MCV: 90.1 fL (ref 78.0–100.0)
Platelets: 283 10*3/uL (ref 150–400)
RDW: 13.9 % (ref 11.5–15.5)
Sodium: 138 meq/L (ref 135–145)
Total Bilirubin: 0.3 mg/dL (ref 0.3–1.2)
Total Protein: 7.8 g/dL (ref 6.0–8.3)

## 2010-04-14 ENCOUNTER — Telehealth: Payer: Self-pay | Admitting: Family Medicine

## 2010-04-14 ENCOUNTER — Ambulatory Visit: Payer: Self-pay | Admitting: Family Medicine

## 2010-04-16 ENCOUNTER — Encounter: Admission: RE | Admit: 2010-04-16 | Discharge: 2010-04-16 | Payer: Self-pay | Admitting: Family Medicine

## 2010-04-16 ENCOUNTER — Encounter: Payer: Self-pay | Admitting: Family Medicine

## 2010-05-19 ENCOUNTER — Ambulatory Visit: Payer: Self-pay | Admitting: Family Medicine

## 2010-05-26 ENCOUNTER — Ambulatory Visit: Payer: Self-pay | Admitting: Family Medicine

## 2010-05-26 ENCOUNTER — Encounter: Payer: Self-pay | Admitting: Family Medicine

## 2010-05-28 ENCOUNTER — Telehealth: Payer: Self-pay | Admitting: Family Medicine

## 2010-06-29 ENCOUNTER — Ambulatory Visit: Payer: Self-pay | Admitting: Family Medicine

## 2010-07-28 ENCOUNTER — Encounter (INDEPENDENT_AMBULATORY_CARE_PROVIDER_SITE_OTHER): Payer: Self-pay | Admitting: Family Medicine

## 2010-07-28 ENCOUNTER — Encounter: Admission: RE | Admit: 2010-07-28 | Discharge: 2010-07-28 | Payer: Self-pay | Admitting: Family Medicine

## 2010-07-28 ENCOUNTER — Ambulatory Visit: Payer: Self-pay | Admitting: Family Medicine

## 2010-07-28 LAB — CONVERTED CEMR LAB
Bilirubin Urine: NEGATIVE
Blood in Urine, dipstick: NEGATIVE
CO2: 23 meq/L (ref 19–32)
Glucose, Bld: 172 mg/dL — ABNORMAL HIGH (ref 70–99)
Ketones, urine, test strip: NEGATIVE
MCV: 91 fL (ref 78.0–100.0)
Potassium: 4.6 meq/L (ref 3.5–5.3)
RBC: 4.55 M/uL (ref 3.87–5.11)
Sodium: 141 meq/L (ref 135–145)
Specific Gravity, Urine: 1.01
Urobilinogen, UA: 0.2
WBC: 8.2 10*3/uL (ref 4.0–10.5)

## 2010-07-29 ENCOUNTER — Encounter (INDEPENDENT_AMBULATORY_CARE_PROVIDER_SITE_OTHER): Payer: Self-pay | Admitting: Family Medicine

## 2010-08-05 ENCOUNTER — Telehealth (INDEPENDENT_AMBULATORY_CARE_PROVIDER_SITE_OTHER): Payer: Self-pay | Admitting: Family Medicine

## 2010-09-23 ENCOUNTER — Telehealth: Payer: Self-pay | Admitting: *Deleted

## 2010-09-25 ENCOUNTER — Ambulatory Visit: Payer: Self-pay | Admitting: Family Medicine

## 2010-10-02 ENCOUNTER — Encounter: Payer: Self-pay | Admitting: Family Medicine

## 2010-10-02 ENCOUNTER — Ambulatory Visit: Payer: Self-pay | Admitting: Family Medicine

## 2010-10-02 LAB — CONVERTED CEMR LAB
Albumin/Creatinine Ratio, Urine, POC: <30
CO2: 27 meq/L (ref 19–32)
Chloride: 100 meq/L (ref 96–112)
Creatinine,U: 50 mg/dL
HDL: 42 mg/dL (ref >39)
Hgb A1c MFr Bld: 6.3 %
LDL Cholesterol: 120 mg/dL — ABNORMAL HIGH (ref 0–99)
Microalbumin U total vol: 10 mg/L
Sodium: 141 meq/L (ref 135–145)
Total CHOL/HDL Ratio: 4.7

## 2010-10-11 ENCOUNTER — Encounter: Payer: Self-pay | Admitting: Family Medicine

## 2010-10-14 ENCOUNTER — Telehealth: Payer: Self-pay | Admitting: Family Medicine

## 2010-10-22 ENCOUNTER — Telehealth: Payer: Self-pay | Admitting: Family Medicine

## 2010-10-24 ENCOUNTER — Telehealth: Payer: Self-pay | Admitting: Family Medicine

## 2010-10-26 ENCOUNTER — Ambulatory Visit: Payer: Self-pay | Admitting: Family Medicine

## 2010-10-27 ENCOUNTER — Encounter: Payer: Self-pay | Admitting: Family Medicine

## 2010-10-28 ENCOUNTER — Telehealth: Payer: Self-pay | Admitting: Family Medicine

## 2010-10-29 ENCOUNTER — Telehealth: Payer: Self-pay | Admitting: Family Medicine

## 2010-11-03 ENCOUNTER — Telehealth: Payer: Self-pay | Admitting: *Deleted

## 2010-11-05 ENCOUNTER — Ambulatory Visit: Payer: Self-pay | Admitting: Family Medicine

## 2010-11-05 ENCOUNTER — Encounter: Payer: Self-pay | Admitting: Family Medicine

## 2010-11-05 LAB — CONVERTED CEMR LAB
Calcium: 9.7 mg/dL (ref 8.4–10.5)
Sodium: 141 meq/L (ref 135–145)

## 2010-11-16 ENCOUNTER — Telehealth: Payer: Self-pay | Admitting: Family Medicine

## 2010-11-18 ENCOUNTER — Ambulatory Visit: Payer: Self-pay | Admitting: Family Medicine

## 2010-12-15 ENCOUNTER — Encounter: Payer: Self-pay | Admitting: Family Medicine

## 2010-12-23 ENCOUNTER — Telehealth: Payer: Self-pay | Admitting: Family Medicine

## 2011-01-14 ENCOUNTER — Encounter (INDEPENDENT_AMBULATORY_CARE_PROVIDER_SITE_OTHER): Payer: Self-pay | Admitting: *Deleted

## 2011-01-19 NOTE — Assessment & Plan Note (Signed)
Summary: meet Dr.,tcb   Vital Signs:  Patient profile:   70 year old female Weight:      193.6 pounds BMI:     39.92 Temp:     98.5 degrees F Pulse rate:   79 / minute BP sitting:   129 / 77  Vitals Entered By: Golden Circle RN (September 25, 2010 1:35 PM)  Primary Provider:  Ardyth Gal MD   History of Present Illness: Pt. comes in for follow up and to meet me.  She says she has been trying to watch her diet and exercise, but her knee is hurting her a lot which interferes with walking.  She has thought about water aerobics but she would have to pay for the classes so she hasn't signed up.  Her chronic GI problems have been bothering her a lot lately.  She describes feeling bloated and gasey, but denies abdominal pain.  Wants to know if there is anything she can take to help with gas.   Problems Prior to Update: 1)  Personal History of Urinary Calculi  (ICD-V13.01) 2)  Dysuria  (ICD-788.1) 3)  Skin Lesion  (ICD-709.9) 4)  Ingrown Toenail  (ICD-703.0) 5)  Chest Wall Pain, Anterior  (ICD-786.52) 6)  Insomnia  (ICD-780.52) 7)  Diabetes Mellitus, II, Complications  (ICD-250.92) 8)  Gastroparesis  (ICD-536.3) 9)  Dyskinesia, Esophagus  (ICD-530.5) 10)  Fatigue  (ICD-780.79) 11)  Anxiety  (ICD-300.00) 12)  Hypertension, Benign Systemic  (ICD-401.1) 13)  Hyperlipidemia  (ICD-272.4) 14)  Leg Edema, Bilateral  (ICD-782.3) 15)  Carcinoma, Basal Cell  (ICD-173.9) 16)  Vaginitis, Atrophic  (ICD-627.3) 17)  Osteopenia  (ICD-733.90) 18)  Osteoarthritis, Multi Sites  (ICD-715.98) 19)  Obesity, Nos  (ICD-278.00) 20)  Gout, Acute  (ICD-274.0) 21)  Gastroesophageal Reflux, No Esophagitis  (ICD-530.81) 22)  Systolic Murmur  (ICD-785.2)  Medications Prior to Update: 1)  Omeprazole 40 Mg Cpdr (Omeprazole) .Marland Kitchen.. 1 By Mouth Two Times A Day 2)  Allopurinol 100 Mg Tabs (Allopurinol) .... Take 1 Tablet By Mouth Once A Day 3)  Bayer Childrens Aspirin 81 Mg Chew (Aspirin) .... Take 1 Tablet  By Mouth Once A Day 4)  Lovastatin 40 Mg Tabs (Lovastatin) .... 2 Tablets Daily 5)  Demadex 20 Mg Tabs (Torsemide) .... Take 1 Tablet By Mouth Once A Day 6)  Claritin 10 Mg Tabs (Loratadine) .Marland Kitchen.. 1 Tablet By Mouth Once A Day 7)  Nasonex 50 Mcg/act  Susp (Mometasone Furoate) .... 2 Sprays Per Nostril Daily. Dispense One Bottle. 8)  Felodipine 10 Mg Tb24 (Felodipine) .... Take 2 Tablet By Mouth Once A Day 9)  Lisinopril-Hydrochlorothiazide 20-12.5 Mg  Tabs (Lisinopril-Hydrochlorothiazide) .... One By Mouth Daily 10)  Daily Multiple Vitamins/min   Tabs (Multiple Vitamins-Minerals) .... Once Daily 11)  Natural Mixed Tocopherols 400 Unit  Caps (Vitamin E) .... 2 Daily 12)  Zantac 150 Mg Tabs (Ranitidine Hcl) .... One Daily 13)  Ativan 1 Mg Tabs (Lorazepam) .Marland Kitchen.. 1-1.5 By Mouth Two Times A Day As Needed 14)  Metoclopramide Hcl 5 Mg Tabs (Metoclopramide Hcl) .Marland Kitchen.. 1 Tablet Three Times A Day Prior To Meals 15)  Glucotrol Xl 10 Mg Xr24h-Tab (Glipizide) .... One Daily 16)  Novolog Flexpen 100 Unit/ml Soln (Insulin Aspart) .Marland Kitchen.. 15 Units Prior To Evening Meal Dispense One Month Supply 17)  Lantus Solostar 100 Unit/ml Soln (Insulin Glargine) .... Inject 36 Unit Subcutaneously Once A Day in The Morning.  Dispense One Month Supply 18)  Glucophage Xr 500 Mg  Tb24 (Metformin Hcl) .Marland KitchenMarland KitchenMarland Kitchen  One Three Times A Day 19)  Freestyle Test   Strp (Glucose Blood) .... Use As Directed To Test 3 Times A Day 20)  Freestyle Lancets   Misc (Lancets) .... Use As Directed 21)  Novofine 31 31g X 6 Mm  Misc (Insulin Pen Needle) .... Use As Directed 22)  Lunesta 2 Mg Tabs (Eszopiclone) .... One Tab By Mouth At Bedtime  Allergies: 1)  ! Beta Blockers 2)  Metoprolol Succinate (Metoprolol Succinate)  Review of Systems  The patient denies anorexia, fever, chest pain, syncope, dyspnea on exertion, prolonged cough, abdominal pain, and unusual weight change.    Physical Exam  General:  Well-developed,well-nourished,in no acute  distress; alert,appropriate and cooperative throughout examination Eyes:  No corneal or conjunctival inflammation noted. EOMI. Perrla. Nose:  External nasal examination shows no deformity or inflammation. Nasal mucosa are pink and moist without lesions or exudates. Mouth:  Oral mucosa and oropharynx without lesions or exudates.  Teeth in good repair. Neck:  No deformities, masses, or tenderness noted. Lungs:  Normal respiratory effort, chest expands symmetrically. Lungs are clear to auscultation, no crackles or wheezes. Heart:  Normal rate and regular rhythm. S1 and S2 normal without gallop, murmur, click, rub or other extra sounds. Abdomen:  Bowel sounds positive,abdomen soft and non-tender without masses, organomegaly or hernias noted. Msk:  Right knee with TTP over medial joint line, no other abnormalities noted, but difficult exam due to body habitus.  Pulses:  R and L carotid,radial,dorsalis pedis and posterior tibial pulses are full and equal bilaterally Extremities:  No Edema   Impression & Recommendations:  Problem # 1:  DIABETES MELLITUS, II, COMPLICATIONS (ICD-250.92) recently well controlled, will check an A1c and have her continue current meds.  Her updated medication list for this problem includes:    Bayer Childrens Aspirin 81 Mg Chew (Aspirin) .Marland Kitchen... Take 1 tablet by mouth once a day    Lisinopril-hydrochlorothiazide 20-12.5 Mg Tabs (Lisinopril-hydrochlorothiazide) ..... One by mouth daily    Glucotrol Xl 10 Mg Xr24h-tab (Glipizide) ..... One daily    Novolog Flexpen 100 Unit/ml Soln (Insulin aspart) .Marland KitchenMarland KitchenMarland KitchenMarland Kitchen 15 units prior to evening meal dispense one month supply    Lantus Solostar 100 Unit/ml Soln (Insulin glargine) ..... Inject 36 unit subcutaneously once a day in the morning.  dispense one month supply    Glucophage Xr 500 Mg Tb24 (Metformin hcl) ..... One three times a day  Orders: [facility]- Est  Level 4 (99214)Future Orders: T-Hgb A1C (in-house) ([id number]) ...  09/27/2011  Problem # 2:  HYPERLIPIDEMIA (ICD-272.4) Has not had lipids checked in more than a year, will do future fasting lipid panel to assess if pt. is at goal.  Her updated medication list for this problem includes:    Lovastatin 40 Mg Tabs (Lovastatin) .Marland Kitchen... 2 tablets daily  Orders: [facility]- Est  Level 4 (99214)Future Orders: T-Lipid Profile ([id number]) ... 09/27/2011  Problem # 3:  HYPERTENSION, BENIGN SYSTEMIC (ICD-401.1) Well controlled, continue current medications Her updated medication list for this problem includes:    Demadex 20 Mg Tabs (Torsemide) .Marland Kitchen... Take 1 tablet by mouth once a day    Felodipine 10 Mg Tb24 (Felodipine) .Marland Kitchen... Take 2 tablet by mouth once a day    Lisinopril-hydrochlorothiazide 20-12.5 Mg Tabs (Lisinopril-hydrochlorothiazide) ..... One by mouth daily  Orders: [facility]- Est  Level 4 (99214)  Problem # 4:  OSTEOARTHRITIS, MULTI SITES (ICD-715.98) Pain in right knee worse lately.  Advised pt.we could refer to sports medicine if she wants, she would like to  think about it.  Encouraged her to consider water aerobics as it would be a good choice for exercise as well as a fun social interaction.  Her updated medication list for this problem includes:    Bayer Childrens Aspirin 81 Mg Chew (Aspirin) .Marland Kitchen... Take 1 tablet by mouth once a day  Orders: [facility]- Est  Level 4 (66063)  Complete Medication List: 1)  Omeprazole 40 Mg Cpdr (Omeprazole) .Marland Kitchen.. 1 by mouth two times a day 2)  Allopurinol 100 Mg Tabs (Allopurinol) .... Take 1 tablet by mouth once a day 3)  Bayer Childrens Aspirin 81 Mg Chew (Aspirin) .... Take 1 tablet by mouth once a day 4)  Lovastatin 40 Mg Tabs (Lovastatin) .... 2 tablets daily 5)  Demadex 20 Mg Tabs (Torsemide) .... Take 1 tablet by mouth once a day 6)  Claritin 10 Mg Tabs (Loratadine) .Marland Kitchen.. 1 tablet by mouth once a day 7)  Nasonex 50 Mcg/act Susp (Mometasone furoate) .... 2 sprays per nostril daily. dispense one bottle. 8)  Felodipine 10 Mg Tb24  (Felodipine) .... Take 2 tablet by mouth once a day 9)  Lisinopril-hydrochlorothiazide 20-12.5 Mg Tabs (Lisinopril-hydrochlorothiazide) .... One by mouth daily 10)  Daily Multiple Vitamins/min Tabs (Multiple vitamins-minerals) .... Once daily 11)  Natural Mixed Tocopherols 400 Unit Caps (Vitamin e) .... 2 daily 12)  Zantac 150 Mg Tabs (Ranitidine hcl) .... One daily 13)  Ativan 1 Mg Tabs (Lorazepam) .Marland Kitchen.. 1-1.5 by mouth two times a day as needed 14)  Metoclopramide Hcl 5 Mg Tabs (Metoclopramide hcl) .Marland Kitchen.. 1 tablet three times a day prior to meals 15)  Glucotrol Xl 10 Mg Xr24h-tab (Glipizide) .... One daily 16)  Novolog Flexpen 100 Unit/ml Soln (Insulin aspart) .Marland Kitchen.. 15 units prior to evening meal dispense one month supply 17)  Lantus Solostar 100 Unit/ml Soln (Insulin glargine) .... Inject 36 unit subcutaneously once a day in the morning.  dispense one month supply 18)  Glucophage Xr 500 Mg Tb24 (Metformin hcl) .... One three times a day 19)  Freestyle Test Strp (Glucose blood) .... Use as directed to test 3 times a day 20)  Freestyle Lancets Misc (Lancets) .... Use as directed 21)  Novofine 31 31g X 6 Mm Misc (Insulin pen needle) .... Use as directed 22)  Lunesta 2 Mg Tabs (Eszopiclone) .... One tab by mouth at bedtime  Other Orders: Influenza Vaccine MCR (01601) Future Orders: T-Basic Metabolic Panel [id number]) ... 09/27/2011  Patient Instructions: 1)  It was nice to meet you today.  Please try to exercise more- either walking or think about water aerobics.  Please let me know if you want to see the sports medicine doctors about your knee.   2)  Please make an appointment on your way out to get your labs drawn first thing some morning next week. 3)  Please come back and see me in about 3 months to check on how you are doing.     Immunizations Administered:  Influenza Vaccine # 1:    Vaccine Type: Fluvax MCR    Site: right deltoid    Mfr: GlaxoSmithKline    Dose: 0.5 ml    Route:  IM    Given by: Jone Baseman CMA    Exp. Date: 06/16/2011    Lot #: [id number]    VIS given: 07/14/10 version given September 25, 2010.  Flu Vaccine Consent Questions:    Do you have a history of severe allergic reactions to this vaccine? no    Any prior  history of allergic reactions to egg and/or gelatin? no    Do you have a sensitivity to the preservative Thimersol? no    Do you have a past history of Guillan-Barre Syndrome? no    Do you currently have an acute febrile illness? no    Have you ever had a severe reaction to latex? no    Vaccine information given and explained to patient? yes    Are you currently pregnant? no

## 2011-01-19 NOTE — Assessment & Plan Note (Signed)
Summary: urinary problems,df   Vital Signs:  Patient profile:   70 year old female Height:      58.5 inches Weight:      194.2 pounds BMI:     40.04 Temp:     98.6 degrees F oral Pulse rate:   92 / minute BP sitting:   144 / 75  (left arm) Cuff size:   regular  Vitals Entered By: Loralee Pacas CMA (July 28, 2010 10:46 AM) CC: dysuria with low back pain Is Patient Diabetic? Yes Did you bring your meter with you today? No   Primary Care Provider:  Ancil Boozer  MD  CC:  dysuria with low back pain.  History of Present Illness: Patient here for c/o burning and discomfort with urination x 2 weeks. She describes a sensation of fullness.  She denies frequency or hematuria or fever.  She also c/o low back pain, no CVAT, but appears to be MSK with muscle spasm in R paraspinal lumbar area. She has been drinking lots of water and took 2 doses of OTC cranberry pills.  UA today negative.    Patient has a remote h/o renal calculi s/p surgery 30 years ago.  She believes that she was told she still has stones in her kidneys, but has not seen a Insurance underwriter in many years.  She is on HCTZ and Demadex which helped with fluid retention initially, but believes these medications are no longer working.  She had a CT abd/pelvis in 04/2010 that revealed uterine fibroids.  She denies N/V/D or constipation.    Regarding her DM2, she is on Metformin and Insulin, blood sugars recently ranging 114-215.  She has a h/o gastroparesis and is followed by GI.  Allergies: 1)  ! Beta Blockers 2)  Metoprolol Succinate (Metoprolol Succinate)  Physical Exam  General:  Well-developed,well-nourished,in no acute distress; alert,appropriate and cooperative throughout examination Lungs:  Normal respiratory effort, chest expands symmetrically. Lungs are clear to auscultation, no crackles or wheezes. Heart:  Normal rate and regular rhythm. S1 and S2 normal without gallop, murmur, click, rub or other extra sounds. Abdomen:   Bowel sounds positive,abdomen soft and non-tender without masses, organomegaly or hernias noted. Obese. Msk:  Spine nontender.  R lumbar paraspinal muscle spasm.  No CVAT bilaterally.   Impression & Recommendations:  Problem # 1:  DYSURIA (ICD-788.1) UA neg.  Symptoms c/w UTI.  Will check UCx.  Patient also c/o abdominal fullness - discussed that this could be due to fibroids vs mild gastroparesis vs SE from Metformin.  Orders: Urinalysis-[facility] (00000) Urine Culture-[facility] ([id number]) [facility]- Est  Level 4 (29562)  Problem # 2:  DIABETES MELLITUS, II, COMPLICATIONS (ICD-250.92) Abdominal fullness, h/o gastroparesis.    Orders: UA Microalbumin-[facility] (13086) CBC-[facility] (57846) Basic Met-[facility] ([id number]) [facility]- Est  Level 4 (24401)  Problem # 3:  PERSONAL HISTORY OF URINARY CALCULI (ICD-V13.01) Will check Abd Xray.  Recent CT makes no mention of kidney abnormality. UA negative.  Orders: Diagnostic X-Ray/Fluoroscopy (Diagnostic X-Ray/Flu) CBC-[facility] (02725) [facility]- Est  Level 4 (36644)  Complete Medication List: 1)  Omeprazole 40 Mg Cpdr (Omeprazole) .Marland Kitchen.. 1 by mouth two times a day 2)  Allopurinol 100 Mg Tabs (Allopurinol) .... Take 1 tablet by mouth once a day 3)  Bayer Childrens Aspirin 81 Mg Chew (Aspirin) .... Take 1 tablet by mouth once a day 4)  Lovastatin 40 Mg Tabs (Lovastatin) .... 2 tablets daily 5)  Demadex 20 Mg Tabs (Torsemide) .... Take 1 tablet by mouth once a day  6)  Claritin 10 Mg Tabs (Loratadine) .Marland Kitchen.. 1 tablet by mouth once a day 7)  Nasonex 50 Mcg/act Susp (Mometasone furoate) .... 2 sprays per nostril daily. dispense one bottle. 8)  Felodipine 10 Mg Tb24 (Felodipine) .... Take 2 tablet by mouth once a day 9)  Lisinopril-hydrochlorothiazide 20-12.5 Mg Tabs (Lisinopril-hydrochlorothiazide) .... One by mouth daily 10)  Daily Multiple Vitamins/min Tabs (Multiple vitamins-minerals) .... Once daily 11)  Natural Mixed Tocopherols 400 Unit Caps (Vitamin e) .... 2 daily 12)  Zantac  150 Mg Tabs (Ranitidine hcl) .... One daily 13)  Ativan 1 Mg Tabs (Lorazepam) .Marland Kitchen.. 1-1.5 by mouth two times a day as needed 14)  Metoclopramide Hcl 5 Mg Tabs (Metoclopramide hcl) .Marland Kitchen.. 1 tablet three times a day prior to meals 15)  Glucotrol Xl 10 Mg Xr24h-tab (Glipizide) .... One daily 16)  Novolog Flexpen 100 Unit/ml Soln (Insulin aspart) .Marland Kitchen.. 15 units prior to evening meal dispense one month supply 17)  Lantus Solostar 100 Unit/ml Soln (Insulin glargine) .... Inject 36 unit subcutaneously once a day in the morning.  dispense one month supply 18)  Glucophage Xr 500 Mg Tb24 (Metformin hcl) .... One three times a day 19)  Freestyle Test Strp (Glucose blood) .... Use as directed to test 3 times a day 20)  Freestyle Lancets Misc (Lancets) .... Use as directed 21)  Novofine 31 31g X 6 Mm Misc (Insulin pen needle) .... Use as directed 22)  Lunesta 2 Mg Tabs (Eszopiclone) .... One tab by mouth at bedtime  Laboratory Results   Urine Tests  Date/Time Received: July 28, 2010 10:44 AM  Date/Time Reported: July 28, 2010 10:55 AM   Routine Urinalysis   Color: yellow Appearance: Clear Glucose: negative   (Normal Range: Negative) Bilirubin: negative   (Normal Range: Negative) Ketone: negative   (Normal Range: Negative) Spec. Gravity: 1.010   (Normal Range: 1.003-1.035) Blood: negative   (Normal Range: Negative) pH: 5.0   (Normal Range: 5.0-8.0) Protein: negative   (Normal Range: Negative) Urobilinogen: 0.2   (Normal Range: 0-1) Nitrite: negative   (Normal Range: Negative) Leukocyte Esterace: negative   (Normal Range: Negative)    Comments: ...............test performed by......Marland KitchenBonnie A. [place], MLS (ASCP)cm

## 2011-01-19 NOTE — Progress Notes (Signed)
Summary: Rx Req  Phone Note Refill Request Call back at Home Phone [phone] Message from:  Patient  Refills Requested: Medication #1:  FELODIPINE 10 MG TB24 Take 2 tablet by mouth once a day [facility].  Initial call taken by: Clydell Hakim,  Rashaun  9, 2011 4:41 PM    Prescriptions: FELODIPINE 10 MG TB24 (FELODIPINE) Take 2 tablet by mouth once a day  #60 x 5   Entered and Authorized by:   Ancil Boozer  MD   Signed by:   Ancil Boozer  MD on 05/28/2010   Method used:   [facility] to        [facility] [address] [phone][facility] (retail)       [address]       [place], [place]  [address]       Ph: [phone] or [phone]       Fax: [phone]   RxID:   [id number]

## 2011-01-19 NOTE — Progress Notes (Signed)
Summary: Rx Prob  Phone Note Call from Patient Call back at Home Phone [phone]   Caller: Patient Summary of Call: Pt does not want the generic of Demedix.  She wants the name brand [facility] into [facility] [address].   Initial call taken by: Clydell Hakim,  January 13, 2010 3:16 PM  Follow-up for Phone Call        called pharmacy and ask that they please give brand name demadex. Follow-up by: Theresia Lo RN,  January 13, 2010 3:27 PM

## 2011-01-19 NOTE — Assessment & Plan Note (Signed)
Summary: f/u  kh   Vital Signs:  Patient profile:   70 year old female Height:      58.5 inches Weight:      192.7 pounds BMI:     39.73 Temp:     97.9 degrees F oral Pulse rate:   91 / minute BP sitting:   135 / 76  (left arm) Cuff size:   regular  Vitals Entered By: Garen Grams LPN (November 18, 2010 1:41 PM) CC: f/u last visit Is Patient Diabetic? Yes Did you bring your meter with you today? No Pain Assessment Patient in pain? no        Primary Rebecca Walter:  Ardyth Gal MD  CC:  f/u last visit.  History of Present Illness: Dizzyness- much improved.  Pt has not had more problems for a week.   DM- well controlled.  Recent morning blood sugars in low 100's.  She complains that she called to get perscription for test strips filled and they were only filled for twice a day.  She checks her blood sugars three times a day and does a novolog sliding scale.  She has been doing this since Dr. Raymondo Band started it and her diabetes has been well controlled since then.   HTN- well controlled. Pt not having any side effects from medications now.  She denies chest pain, shortness of breath, palpitations.  Obesity- pt says she wants to start doing a work out video and wants to find somewhere inside she can walk.  She wants to try to lose some weight.   Preventive Screening-Counseling & Management  Alcohol-Tobacco     Alcohol drinks/day: 0     Smoking Status: never  Allergies: 1)  ! Beta Blockers 2)  Metoprolol Succinate (Metoprolol Succinate)  Review of Systems       See HPI.   Physical Exam  General:  Well-developed,well-nourished,in no acute distress; alert,appropriate and cooperative throughout examination Eyes:  No corneal or conjunctival inflammation noted. EOMI. Perrla. Funduscopic exam benign, without hemorrhages, exudates or papilledema. Vision grossly normal. Mouth:  Oral mucosa and oropharynx without lesions or exudates.  Teeth in good repair. Neck:  No  deformities, masses, or tenderness noted. Lungs:  Normal respiratory effort, chest expands symmetrically. Lungs are clear to auscultation, no crackles or wheezes. Heart:  Normal rate and regular rhythm. S1 and S2 normal without gallop, murmur, click, rub or other extra sounds. Abdomen:  Bowel sounds positive,abdomen soft and non-tender without masses, organomegaly or hernias noted. Pulses:  R and L carotid,radial,dorsalis pedis and posterior tibial pulses are full and equal bilaterally Extremities:  No clubbing, cyanosis, edema, or deformity noted with normal full range of motion of all joints.     Impression & Recommendations:  Problem # 1:  DIABETES MELLITUS, II, COMPLICATIONS (ICD-250.92) Well controlled.  Will refill her test strips so she can continue to test her blood sugar three times a day.  Cont. current meds.  Her updated medication list for this problem includes:    Bayer Childrens Aspirin 81 Mg Chew (Aspirin) .Marland Kitchen... Take 1 tablet by mouth once a day    Lisinopril-hydrochlorothiazide 20-12.5 Mg Tabs (Lisinopril-hydrochlorothiazide) ..... One by mouth daily    Glucotrol Xl 10 Mg Xr24h-tab (Glipizide) ..... One daily    Novolog Flexpen 100 Unit/ml Soln (Insulin aspart) .Marland KitchenMarland KitchenMarland KitchenMarland Kitchen 15 units prior to evening meal dispense one month supply    Lantus Solostar 100 Unit/ml Soln (Insulin glargine) ..... Inject 36 unit subcutaneously once a day in the morning.  dispense one  month supply    Glucophage Xr 500 Mg Tb24 (Metformin hcl) ..... One three times a day  Orders: [facility]- Est  Level 4 (16109)  Problem # 2:  DIZZINESS (ICD-780.4) Improved.  Etiology still unclear but since it has resolved no further work up necessary.  Her updated medication list for this problem includes:    Claritin 10 Mg Tabs (Loratadine) .Marland Kitchen... 1 tablet by mouth once a day    Meclizine Hcl 25 Mg Tabs (Meclizine hcl) .Marland Kitchen... Take 1 tab at night for dizziness and sleep  Orders: [facility]- Est  Level 4 (60454)  Problem # 3:   HYPERTENSION, BENIGN SYSTEMIC (ICD-401.1) Well controlled. Will continue current regimen.  Her updated medication list for this problem includes:    Demadex 20 Mg Tabs (Torsemide) .Marland Kitchen... Take 1 tablet by mouth once a day    Felodipine 10 Mg Tb24 (Felodipine) .Marland Kitchen... Take 2 tablet by mouth once a day    Lisinopril-hydrochlorothiazide 20-12.5 Mg Tabs (Lisinopril-hydrochlorothiazide) ..... One by mouth daily  Orders: [facility]- Est  Level 4 (09811)  Problem # 4:  OBESITY, NOS (ICD-278.00) Pt. seems motivated to lose some weight and I encouraged her.  Stressed importance of healthy diet and exercise.  Orders: [facility]- Est  Level 4 (91478)  Complete Medication List: 1)  Omeprazole 40 Mg Cpdr (Omeprazole) .Marland Kitchen.. 1 by mouth two times a day 2)  Allopurinol 100 Mg Tabs (Allopurinol) .... Take 1 tablet by mouth once a day 3)  Bayer Childrens Aspirin 81 Mg Chew (Aspirin) .... Take 1 tablet by mouth once a day 4)  Lovastatin 40 Mg Tabs (Lovastatin) .... 2 tablets daily 5)  Demadex 20 Mg Tabs (Torsemide) .... Take 1 tablet by mouth once a day 6)  Claritin 10 Mg Tabs (Loratadine) .Marland Kitchen.. 1 tablet by mouth once a day 7)  Nasonex 50 Mcg/act Susp (Mometasone furoate) .... 2 sprays per nostril daily. dispense one bottle. 8)  Felodipine 10 Mg Tb24 (Felodipine) .... Take 2 tablet by mouth once a day 9)  Lisinopril-hydrochlorothiazide 20-12.5 Mg Tabs (Lisinopril-hydrochlorothiazide) .... One by mouth daily 10)  Daily Multiple Vitamins/min Tabs (Multiple vitamins-minerals) .... Once daily 11)  Natural Mixed Tocopherols 400 Unit Caps (Vitamin e) .... 2 daily 12)  Zantac 150 Mg Tabs (Ranitidine hcl) .... One daily 13)  Ativan 1 Mg Tabs (Lorazepam) .Marland Kitchen.. 1-1.5 by mouth two times a day as needed 14)  Metoclopramide Hcl 5 Mg Tabs (Metoclopramide hcl) .Marland Kitchen.. 1 tablet three times a day prior to meals 15)  Glucotrol Xl 10 Mg Xr24h-tab (Glipizide) .... One daily 16)  Novolog Flexpen 100 Unit/ml Soln (Insulin aspart) .Marland Kitchen.. 15 units prior  to evening meal dispense one month supply 17)  Lantus Solostar 100 Unit/ml Soln (Insulin glargine) .... Inject 36 unit subcutaneously once a day in the morning.  dispense one month supply 18)  Glucophage Xr 500 Mg Tb24 (Metformin hcl) .... One three times a day 19)  Freestyle Test Strp (Glucose blood) .... Use as directed to test 3 times a day 20)  Freestyle Lancets Misc (Lancets) .... Use as directed 21)  Novofine 31 31g X 6 Mm Misc (Insulin pen needle) .... Use as directed 22)  Lunesta 2 Mg Tabs (Eszopiclone) .... One tab by mouth at bedtime 23)  Meclizine Hcl 25 Mg Tabs (Meclizine hcl) .... Take 1 tab at night for dizziness and sleep  Patient Instructions: 1)  I am glad you are feeling better.  I have sent your prescription for your test strips to [facility]  electronically.  Let the office know if you have trouble.   2)  I want you to try to exercise every day.  Find somewhere you can walk that is inside.   3)  Come back and see me in three months for follow up.  Prescriptions: FREESTYLE TEST   STRP (GLUCOSE BLOOD) use as directed to test 3 times a day  #300 x 11   Entered and Authorized by:   Ardyth Gal MD   Signed by:   Ardyth Gal MD on 11/18/2010   Method used:   Electronically to        [facility]* (mail-order)       [address]       [place], [place]  [address]       Ph: [phone]       Fax: [phone]   RxID:   [id number]    Orders Added: 1)  [facility]- Est  Level 4 [16010]

## 2011-01-19 NOTE — Assessment & Plan Note (Signed)
Summary: lightheadness/ls   Vital Signs:  Patient profile:   70 year old female Height:      58.5 inches Weight:      193.13 pounds BMI:     39.82 BSA:     1.81 Temp:     98.5 degrees F Pulse rate:   81 / minute BP sitting:   147 / 75 BP standing:   131 / 82  Vitals Entered By: Jone Baseman CMA (November 05, 2010 2:15 PM)  Serial Vital Signs/Assessments:  Time      Position  BP       Pulse  Resp  Temp     By 3:09 PM   Lying LA  135/78                         Jessica Fleeger CMA 3:09 PM   Sitting   132/82   131                   Jessica Fleeger CMA 3:09 PM   Standing  131/82                         Jessica Fleeger CMA  CC: Lightheadness Is Patient Diabetic? No Pain Assessment Patient in pain? no        Primary Roshunda Keir:  Ardyth Gal MD  CC:  Lightheadness.  History of Present Illness: Pt returns today still complaining of lightheadedness/dizzyness.  She says the room does not spin, but when she stands up and walks she feels like she is falling to the side.  She says this has been waxing and waning since the two days she took extra lisinopril due to her pharmacy's mistake.  She believes that the lisinopril has done something to cause this on going problem.    Pt. has done the BPPV seated maneuvers a few times in the past week, but does not think it is helping.   Pt says that when she has felt dizzy she has taken her blood pressure, and it has ranged from 130/60 to 150/80.  She denies any chest pain or shortness of breath.  She denies losing consciousness or falling.    Pt. says her blood sugars have ranged from 120's-180's for the past several weeks, which is higher than normal.  Says she has had an OK appetite.   Denies any dysuria, urinary frequency, fevers, chills, vision changes.   Habits & Providers  Alcohol-Tobacco-Diet     Alcohol drinks/day: 0     Tobacco Status: never  Current Medications (verified): 1)  Omeprazole 40 Mg Cpdr (Omeprazole) .Marland Kitchen.. 1  By Mouth Two Times A Day 2)  Allopurinol 100 Mg Tabs (Allopurinol) .... Take 1 Tablet By Mouth Once A Day 3)  Bayer Childrens Aspirin 81 Mg Chew (Aspirin) .... Take 1 Tablet By Mouth Once A Day 4)  Lovastatin 40 Mg Tabs (Lovastatin) .... 2 Tablets Daily 5)  Demadex 20 Mg Tabs (Torsemide) .... Take 1 Tablet By Mouth Once A Day 6)  Claritin 10 Mg Tabs (Loratadine) .Marland Kitchen.. 1 Tablet By Mouth Once A Day 7)  Nasonex 50 Mcg/act  Susp (Mometasone Furoate) .... 2 Sprays Per Nostril Daily. Dispense One Bottle. 8)  Felodipine 10 Mg Tb24 (Felodipine) .... Take 2 Tablet By Mouth Once A Day 9)  Lisinopril-Hydrochlorothiazide 20-12.5 Mg  Tabs (Lisinopril-Hydrochlorothiazide) .... One By Mouth Daily 10)  Daily Multiple Vitamins/min   Tabs (Multiple Vitamins-Minerals) .... Once  Daily 11)  Natural Mixed Tocopherols 400 Unit  Caps (Vitamin E) .... 2 Daily 12)  Zantac 150 Mg Tabs (Ranitidine Hcl) .... One Daily 13)  Ativan 1 Mg Tabs (Lorazepam) .Marland Kitchen.. 1-1.5 By Mouth Two Times A Day As Needed 14)  Metoclopramide Hcl 5 Mg Tabs (Metoclopramide Hcl) .Marland Kitchen.. 1 Tablet Three Times A Day Prior To Meals 15)  Glucotrol Xl 10 Mg Xr24h-Tab (Glipizide) .... One Daily 16)  Novolog Flexpen 100 Unit/ml Soln (Insulin Aspart) .Marland Kitchen.. 15 Units Prior To Evening Meal Dispense One Month Supply 17)  Lantus Solostar 100 Unit/ml Soln (Insulin Glargine) .... Inject 36 Unit Subcutaneously Once A Day in The Morning.  Dispense One Month Supply 18)  Glucophage Xr 500 Mg  Tb24 (Metformin Hcl) .... One Three Times A Day 19)  Freestyle Test   Strp (Glucose Blood) .... Use As Directed To Test 3 Times A Day 20)  Freestyle Lancets   Misc (Lancets) .... Use As Directed 21)  Novofine 31 31g X 6 Mm  Misc (Insulin Pen Needle) .... Use As Directed 22)  Lunesta 2 Mg Tabs (Eszopiclone) .... One Tab By Mouth At Bedtime 23)  Meclizine Hcl 25 Mg Tabs (Meclizine Hcl) .... Take 1 Tab At Night For Dizziness and Sleep  Allergies: 1)  ! Beta Blockers 2)  Metoprolol  Succinate (Metoprolol Succinate)  Review of Systems       See HPI.   Physical Exam  General:  Well-developed,well-nourished,in no acute distress; alert,appropriate and cooperative throughout examination Eyes:  No corneal or conjunctival inflammation noted. EOMI. Perrla. Funduscopic exam benign, without hemorrhages, exudates or papilledema. Vision grossly normal. Mouth:  Oral mucosa and oropharynx without lesions or exudates.  Teeth in good repair. Neck:  No deformities, masses, or tenderness noted. Lungs:  Normal respiratory effort, chest expands symmetrically. Lungs are clear to auscultation, no crackles or wheezes. Heart:  Normal rate and regular rhythm. S1 and S2 normal without gallop, murmur, click, rub or other extra sounds. Msk:  No deformity or scoliosis noted of thoracic or lumbar spine.  Strenght is 5/5 and equal in bilateral upper and lower extremities.  Extremities:  No clubbing, cyanosis, edema, or deformity noted with normal full range of motion of all joints.   Neurologic:  No cranial nerve deficits noted. Station and gait are normal. Plantar reflexes are down-going bilaterally. DTRs are symmetrical throughout. Sensory, motor and coordinative functions appear intact.   Impression & Recommendations:  Problem # 1:  DIZZINESS (ICD-780.4) Unsure of cause of ongoing dizzyness.  I do not think this is due to the two extra doses of lisinopril, as that was more than a week ago, and should not still be affecting her.  Orthostatic BP's are negative, and she has been taking her medications and BP still mildly elevated today.  She has no acute neurological deficits, and her coordination is in tact. I will check an electrolyte panel to look for abnormalities. I encouraged pt to continue with the BPPV maneuvers, at least twice daily, not a few times a week to see if she improves.   Her updated medication list for this problem includes:    Claritin 10 Mg Tabs (Loratadine) .Marland Kitchen... 1 tablet by  mouth once a day    Meclizine Hcl 25 Mg Tabs (Meclizine hcl) .Marland Kitchen... Take 1 tab at night for dizziness and sleep  Orders: Basic Met-[facility] [phone]) EMR Misc Charge Code (EMRMisc) [facility]- Est  Level 4 (02725)  Problem # 2:  HYPERTENSION, BENIGN SYSTEMIC (ICD-401.1)  Not well controlled,  but acceptable since pt. is having problems with dizzyness.  Pt. is resistant to changing from lisinopril to losartan today, despite the problems she has had with dizzyness.  Will see her in 2 weeks and discuss changing medications then if she is still having symptoms that she thinks is caused by her BP meds.  Her updated medication list for this problem includes:    Demadex 20 Mg Tabs (Torsemide) .Marland Kitchen... Take 1 tablet by mouth once a day    Felodipine 10 Mg Tb24 (Felodipine) .Marland Kitchen... Take 2 tablet by mouth once a day    Lisinopril-hydrochlorothiazide 20-12.5 Mg Tabs (Lisinopril-hydrochlorothiazide) ..... One by mouth daily  Orders: [facility]- Est  Level 4 (47829)  Problem # 3:  DIABETES MELLITUS, II, COMPLICATIONS (ICD-250.92)  Well controlled.  Pt does not seem to be having any problems with hypoglycemia that are contributing to her current symptoms.  Her updated medication list for this problem includes:    Bayer Childrens Aspirin 81 Mg Chew (Aspirin) .Marland Kitchen... Take 1 tablet by mouth once a day    Lisinopril-hydrochlorothiazide 20-12.5 Mg Tabs (Lisinopril-hydrochlorothiazide) ..... One by mouth daily    Glucotrol Xl 10 Mg Xr24h-tab (Glipizide) ..... One daily    Novolog Flexpen 100 Unit/ml Soln (Insulin aspart) .Marland KitchenMarland KitchenMarland KitchenMarland Kitchen 15 units prior to evening meal dispense one month supply    Lantus Solostar 100 Unit/ml Soln (Insulin glargine) ..... Inject 36 unit subcutaneously once a day in the morning.  dispense one month supply    Glucophage Xr 500 Mg Tb24 (Metformin hcl) ..... One three times a day  Orders: [facility]- Est  Level 4 (56213)  Complete Medication List: 1)  Omeprazole 40 Mg Cpdr (Omeprazole) .Marland Kitchen.. 1 by mouth two times a  day 2)  Allopurinol 100 Mg Tabs (Allopurinol) .... Take 1 tablet by mouth once a day 3)  Bayer Childrens Aspirin 81 Mg Chew (Aspirin) .... Take 1 tablet by mouth once a day 4)  Lovastatin 40 Mg Tabs (Lovastatin) .... 2 tablets daily 5)  Demadex 20 Mg Tabs (Torsemide) .... Take 1 tablet by mouth once a day 6)  Claritin 10 Mg Tabs (Loratadine) .Marland Kitchen.. 1 tablet by mouth once a day 7)  Nasonex 50 Mcg/act Susp (Mometasone furoate) .... 2 sprays per nostril daily. dispense one bottle. 8)  Felodipine 10 Mg Tb24 (Felodipine) .... Take 2 tablet by mouth once a day 9)  Lisinopril-hydrochlorothiazide 20-12.5 Mg Tabs (Lisinopril-hydrochlorothiazide) .... One by mouth daily 10)  Daily Multiple Vitamins/min Tabs (Multiple vitamins-minerals) .... Once daily 11)  Natural Mixed Tocopherols 400 Unit Caps (Vitamin e) .... 2 daily 12)  Zantac 150 Mg Tabs (Ranitidine hcl) .... One daily 13)  Ativan 1 Mg Tabs (Lorazepam) .Marland Kitchen.. 1-1.5 by mouth two times a day as needed 14)  Metoclopramide Hcl 5 Mg Tabs (Metoclopramide hcl) .Marland Kitchen.. 1 tablet three times a day prior to meals 15)  Glucotrol Xl 10 Mg Xr24h-tab (Glipizide) .... One daily 16)  Novolog Flexpen 100 Unit/ml Soln (Insulin aspart) .Marland Kitchen.. 15 units prior to evening meal dispense one month supply 17)  Lantus Solostar 100 Unit/ml Soln (Insulin glargine) .... Inject 36 unit subcutaneously once a day in the morning.  dispense one month supply 18)  Glucophage Xr 500 Mg Tb24 (Metformin hcl) .... One three times a day 19)  Freestyle Test Strp (Glucose blood) .... Use as directed to test 3 times a day 20)  Freestyle Lancets Misc (Lancets) .... Use as directed 21)  Novofine 31 31g X 6 Mm Misc (Insulin pen needle) .... Use as  directed 22)  Lunesta 2 Mg Tabs (Eszopiclone) .... One tab by mouth at bedtime 23)  Meclizine Hcl 25 Mg Tabs (Meclizine hcl) .... Take 1 tab at night for dizziness and sleep  Patient Instructions: 1)  I am sorry you are still feeling bad.  I do not think this  is due to low blood pressure or too much blood pressure medicine, but I want to keep a close eye on it.  Keep your appointment for Nov. 30th so we can see if you feel better.  Take your medications every day.  Please have your labs drawn before you go.    Orders Added: 1)  Basic Met-[facility] [[id number]] 2)  EMR Misc Charge Code [EMRMisc] 3)  [facility]- Est  Level 4 [57846]

## 2011-01-19 NOTE — Progress Notes (Signed)
Summary: phn msg  Phone Note Call from Patient Call back at Home Phone [phone]   Caller: Patient Summary of Call: pt states that the Meclizine makes her dizzier -  Initial call taken by: De Nurse,  October 28, 2010 11:42 AM  Follow-up for Phone Call        Pt. says meclazine makes her feel more dizzy.  I told her to stop taking that medicine if it makes her feel worse.    Has done BPP maneuvers once.  Put vitamin E in her ear.  Advised not putting anything in her ear.  Also advised doing the BPP maneuvers several times a day.  Asked her to let us know if she does not start feeling better.  Follow-up by: Ardyth Gal MD,  October 28, 2010 2:02 PM

## 2011-01-19 NOTE — Assessment & Plan Note (Signed)
Summary: FU/KH   Vital Signs:  Patient profile:   70 year old female Height:      58.5 inches Weight:      193.1 pounds BMI:     39.81 Temp:     98.0 degrees F oral Pulse rate:   84 / minute BP sitting:   137 / 69  (right arm) Cuff size:   regular  Vitals Entered By: Garen Grams LPN (April 14, 2010 10:37 AM) CC: f/u fatigue, abd pain Is Patient Diabetic? Yes Pain Assessment Patient in pain? no        Primary Care Provider:  Ancil Boozer  MD  CC:  f/u fatigue and abd pain.  History of Present Illness: fatigue: persists.  also thinks she is more anxious.  thinks she may have some depression as well.  requesting refil on her lorazepam.  states she doesn't want to take antidepressants because she doesn't respond well to them but she cannot clarify what this means.  reportedly her son is on prozac and has responded well.  she reports she has tried prozac as well in the past.  labs reviewed with her that show normal hgb, etc.  she wonders if part of what is going on could be b12 deficiency.   abd pain: persists.  protonix helps a little but she doesn't think she can continue it 2/2 cost.  wonders if she could go back to higher dose of prilosec.  also started probiotics which she thinks has helped a little.  states she is eating okay, moving her bowels with her current regimen.  just continues to have bloating and a feeling "like she is going to have a period."  she is very concerned  Habits & Providers  Alcohol-Tobacco-Diet     Tobacco Status: never  Allergies (verified): 1)  ! Beta Blockers 2)  Metoprolol Succinate (Metoprolol Succinate)  Past History:  Past medical, surgical, family and social histories (including risk factors) reviewed for relevance to current acute and chronic problems.  Past Medical History: Reviewed history from 05/22/2009 and no changes required. HTN: Has been on Lisinopril/HCTZ but reports it caused "mouth turning sour" so d/c'ed Andrya 09. Diovan HCT  stopped 2008 due to "worsening swelling."  Lotensin HCT caused her to "not feel good" so she has switched back to Lisinopri HCTZ and tolerates it well as of august 2009  DMT2: Despite extensive education, pt uses DM medications as needed and is not consistent with dosing. A1Cs have been stable at goal, however, and she is without hypoglycemic events.   GI: Abd U/S and EGD - nml - 11/19/1996 US - Abdominal--neg GB, + stones L kdney - 10/20/2001 Colonoscopy--nml - 12/20/2001 esophageal dysmotility - normal UGI 10/2007 by Dr Evette Cristal  Cards: Stress test abnml - 12/20/1992, Echo--nml - 12/20/1992  Cardiolyte--Neg - 04/20/1995, Cardiolite--Neg. 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 L meniscectomy 3/97  Family History: Reviewed history from 07/05/2008 and no changes required. father - CAD, HTN, died of CVA  at 48 mother - thrombocythemia, CVA sister - lymphoma uncle - DM brother - skin cancer, aortic valve replacement brother - CAD in 21s  no family history of ovarian or breast or other abdominal  cancer that she can recall.    Social History: Reviewed history from 05/26/2007 and no changes required. Husband deceased.  Lives alone. Retired.  Has a child that lives out of town.  Her faith is Jehovah's Witness.  No tobacco, illicit, or alcohol use.  Review of Systems       per HPI.  no fevers.  no weight loss  Physical Exam  General:  Well-developed,well-nourished,in no acute distress;  alert,appropriate and cooperative throughout examination VS reviewed Lungs:  Normal respiratory effort, chest expands symmetrically. Lungs are clear to auscultation, no crackles or wheezes. Heart:  Normal rate and regular rhythm. S1 and S2 normal without gallop, murmur, click, rub or other extra sounds. Abdomen:  soft, non-tender, normal bowel sounds. slightly bloated.     Impression & Recommendations:  Problem # 1:  ABDOMINAL PAIN, UNSPECIFIED SITE (ICD-789.00) Assessment Unchanged  unclear etiology.  could be 2/2 gastroparesis but would think it would improve at least partially with regimen she is on. concerned it could be possibly ovarian pathology.  Cmet from recently reviewed and WNL also CBC.  discussed with patient about watchful waiting vs ct scan of abdomen to r/o ovarian pathology, etc. she wishes to proceed with ct scan.    Her updated medication list for this problem includes:    Metoclopramide Hcl 5 Mg Tabs (Metoclopramide hcl) .Marland Kitchen... 1 tablet three times a day prior to meals  Orders: [facility]- Est  Level 4 (47829)  Problem # 2:  FATIGUE (ICD-780.79) Assessment: Unchanged  still suspect mild depression. labs reviewed and unrevealing for cause.  phq9 borderline for depression.  monitor closely.    Orders: [facility]- Est  Level 4 (56213)  Complete Medication List: 1)  Omeprazole 40 Mg Cpdr (Omeprazole) .Marland Kitchen.. 1 by mouth two times a day 2)  Allopurinol 100 Mg Tabs (Allopurinol) .... Take 1 tablet by mouth once a day 3)  Bayer Childrens Aspirin 81 Mg Chew (Aspirin) .... Take 1 tablet by mouth once a day 4)  Lovastatin 40 Mg Tabs (Lovastatin) .... 2 tablets daily 5)  Demadex 20 Mg Tabs (Torsemide) .... Take 1 tablet by mouth once a day 6)  Claritin 10 Mg Tabs (Loratadine) .Marland Kitchen.. 1 tablet by mouth once a day 7)  Nasonex 50 Mcg/act Susp (Mometasone furoate) .... 2 sprays per nostril daily. dispense one bottle. 8)  Felodipine 10 Mg Tb24 (Felodipine) .... Take 2 tablet by mouth once a  day 9)  Lisinopril-hydrochlorothiazide 20-12.5 Mg Tabs (Lisinopril-hydrochlorothiazide) .... One by mouth daily 10)  Daily Multiple Vitamins/min Tabs (Multiple vitamins-minerals) .... Once daily 11)  Natural Mixed Tocopherols 400 Unit Caps (Vitamin e) .... 2 daily 12)  Zantac 150 Mg Tabs (Ranitidine hcl) .... One daily 13)  Ativan 1 Mg Tabs (Lorazepam) .Marland Kitchen.. 1-1.5 by mouth two times a day as needed 14)  Metoclopramide Hcl 5 Mg Tabs (Metoclopramide hcl) .Marland Kitchen.. 1 tablet three times a day prior to meals 15)  Glucotrol Xl 10 Mg Xr24h-tab (Glipizide) .... One daily 16)  Novolog Flexpen 100 Unit/ml Soln (Insulin aspart) .Marland Kitchen.. 15 units prior to evening meal dispense one month supply 17)  Lantus Solostar 100 Unit/ml Soln (Insulin glargine) .... Inject 36 unit subcutaneously once a day in the morning.  dispense one month supply 18)  Glucophage Xr 500 Mg Tb24 (Metformin hcl) .Marland KitchenMarland KitchenMarland Kitchen  One three times a day 19)  Freestyle Test Strp (Glucose blood) .... Use as directed to test 3 times a day 20)  Freestyle Lancets Misc (Lancets) .... Use as directed 21)  Novofine 31 31g X 6 Mm Misc (Insulin pen needle) .... Use as directed 22)  Lunesta 2 Mg Tabs (Eszopiclone) .... One tab by mouth at bedtime  Other Orders: CT with Contrast (CT w/ contrast)  Patient Instructions: 1)  We will get the CT scan to help evaluate what is going on in your abdomen. 2)  Please follow up with me in about 6 weeks to review how things aer going. Prescriptions: OMEPRAZOLE 40 MG CPDR (OMEPRAZOLE) 1 by mouth two times a day  #60 x PRN   Entered and Authorized by:   Ancil Boozer  MD   Signed by:   Starleen Blue RN on 04/14/2010   Method used:   Handwritten   RxID:   [id number] ATIVAN 1 MG TABS (LORAZEPAM) 1-1.5 by mouth two times a day as needed  #60 x 1   Entered and Authorized by:   Ancil Boozer  MD   Signed by:   Starleen Blue RN on 04/14/2010   Method used:   Handwritten   RxID:   [id number]

## 2011-01-19 NOTE — Progress Notes (Signed)
  Phone Note Call from Patient   Caller: Patient Call For: [phone] Summary of Call: Pt want to discuss her bp meds.  Still having adverse reactions from them.  She's quite anxious about this.  Would like to have you call to see about changing to something else. Initial call taken by: Abundio Miu,  October 29, 2010 9:48 AM  Follow-up for Phone Call        Will have pt. follow up in clinic, I do not want to change her BP meds over the phone.  Follow-up by: Ardyth Gal MD,  November 04, 2010 6:50 PM

## 2011-01-19 NOTE — Progress Notes (Signed)
Summary: meds prob  Phone Note Call from Patient Call back at Home Phone [phone]   Caller: Patient Summary of Call: pt would rather take the Lunesta and cannot take the Trazadone - has not been taking - [facility]  Initial call taken by: De Nurse,  January 01, 2010 10:02 AM  Follow-up for Phone Call        to Dr Wallene Huh Follow-up by: Gladstone Pih,  January 01, 2010 10:55 AM    New/Updated Medications: LUNESTA 2 MG TABS (ESZOPICLONE) One tab by mouth at bedtime Prescriptions: LUNESTA 2 MG TABS (ESZOPICLONE) One tab by mouth at bedtime  #30 x 5   Entered and Authorized by:   Bobby Rumpf  MD   Signed by:   Bobby Rumpf  MD on 01/01/2010   Method used:   Printed then faxed [facility] ...       [facility] [address] [phone]* (retail)       [address]       [place], [place]  [address]       Ph: [phone] or [phone]       Fax: [phone]   RxID:   [id number] LUNESTA 2 MG TABS (ESZOPICLONE) One tab by mouth at bedtime  #30 x 5   Entered and Authorized by:   Bobby Rumpf  MD   Signed by:   Bobby Rumpf  MD on 01/01/2010   Method used:   Printed then faxed to ...       [facility]  [address] [phone]* (retail)       [address]       [place], [place]  [address]       Ph: [phone] or [phone]       Fax: [phone]   RxID:   [id number]

## 2011-01-19 NOTE — Progress Notes (Signed)
----   Converted from flag ---- ---- 10/30/2010 11:16 AM, De Nurse wrote:   ---- 10/29/2010 12:43 PM, Ardyth Gal MD wrote: Please have Ms. Reach schedule an appointment for her dizzyness since she has called the past two days complaining that it is still bothering her.   Thanks, Ardyth Gal ------------------------------  Appended Document:  spoke with patient and she states she is having continued dizziness and lightheadedness. she is quite concerned about BP readings stating today it was 157/74. reassured her that although the top # is higher than ideal not in dangerous zone. Marland Kitchen appointment scheduled tomorrow for work in appointment.

## 2011-01-19 NOTE — Assessment & Plan Note (Signed)
Summary: toe nail removal/per Sheccid Lahmann/eo   Vital Signs:  Patient profile:   70 year old female Height:      58.5 inches Weight:      195 pounds BMI:     40.21 BSA:     1.81 Temp:     98.9 degrees F Pulse rate:   93 / minute BP sitting:   164 / 80  Vitals Entered By: Jone Baseman CMA (Nakhia  7, 2011 1:37 PM) CC: toe nail removal Is Patient Diabetic? No Pain Assessment Patient in pain? no        Primary Care Provider:  Ancil Boozer  MD  CC:  toe nail removal.  History of Present Illness: toe nail removal: here to get R great toenail removed.  chronically infected and ingrown.  currently tender with mild swelling on medial side of nail.   Habits & Providers  Alcohol-Tobacco-Diet     Tobacco Status: never  Current Medications (verified): 1)  Omeprazole 40 Mg Cpdr (Omeprazole) .Marland Kitchen.. 1 By Mouth Two Times A Day 2)  Allopurinol 100 Mg Tabs (Allopurinol) .... Take 1 Tablet By Mouth Once A Day 3)  Bayer Childrens Aspirin 81 Mg Chew (Aspirin) .... Take 1 Tablet By Mouth Once A Day 4)  Lovastatin 40 Mg Tabs (Lovastatin) .... 2 Tablets Daily 5)  Demadex 20 Mg Tabs (Torsemide) .... Take 1 Tablet By Mouth Once A Day 6)  Claritin 10 Mg Tabs (Loratadine) .Marland Kitchen.. 1 Tablet By Mouth Once A Day 7)  Nasonex 50 Mcg/act  Susp (Mometasone Furoate) .... 2 Sprays Per Nostril Daily. Dispense One Bottle. 8)  Felodipine 10 Mg Tb24 (Felodipine) .... Take 2 Tablet By Mouth Once A Day 9)  Lisinopril-Hydrochlorothiazide 20-12.5 Mg  Tabs (Lisinopril-Hydrochlorothiazide) .... One By Mouth Daily 10)  Daily Multiple Vitamins/min   Tabs (Multiple Vitamins-Minerals) .... Once Daily 11)  Natural Mixed Tocopherols 400 Unit  Caps (Vitamin E) .... 2 Daily 12)  Zantac 150 Mg Tabs (Ranitidine Hcl) .... One Daily 13)  Ativan 1 Mg Tabs (Lorazepam) .Marland Kitchen.. 1-1.5 By Mouth Two Times A Day As Needed 14)  Metoclopramide Hcl 5 Mg Tabs (Metoclopramide Hcl) .Marland Kitchen.. 1 Tablet Three Times A Day Prior To Meals 15)  Glucotrol Xl 10 Mg  Xr24h-Tab (Glipizide) .... One Daily 16)  Novolog Flexpen 100 Unit/ml Soln (Insulin Aspart) .Marland Kitchen.. 15 Units Prior To Evening Meal Dispense One Month Supply 17)  Lantus Solostar 100 Unit/ml Soln (Insulin Glargine) .... Inject 36 Unit Subcutaneously Once A Day in The Morning.  Dispense One Month Supply 18)  Glucophage Xr 500 Mg  Tb24 (Metformin Hcl) .... One Three Times A Day 19)  Freestyle Test   Strp (Glucose Blood) .... Use As Directed To Test 3 Times A Day 20)  Freestyle Lancets   Misc (Lancets) .... Use As Directed 21)  Novofine 31 31g X 6 Mm  Misc (Insulin Pen Needle) .... Use As Directed 22)  Lunesta 2 Mg Tabs (Eszopiclone) .... One Tab By Mouth At Bedtime  Allergies (verified): 1)  ! Beta Blockers 2)  Metoprolol Succinate (Metoprolol Succinate)  Past History:  Past medical, surgical, family and social histories (including risk factors) reviewed for relevance to current acute and chronic problems.  Past Medical History: Reviewed history from 05/22/2009 and no changes required. HTN: Has been on Lisinopril/HCTZ but reports it caused "mouth turning sour" so d/c'ed Kalysta 09. Diovan HCT stopped 2008 due to "worsening swelling."  Lotensin HCT caused her to "not feel good" so she has switched back to  Lisinopri HCTZ and tolerates it well as of august 2009  DMT2: Despite extensive education, pt uses DM medications as needed and is not consistent with dosing. A1Cs have been stable at goal, however, and she is without hypoglycemic events.   GI: Abd U/S and EGD - nml - 11/19/1996 US - Abdominal--neg GB, + stones L kdney - 10/20/2001 Colonoscopy--nml - 12/20/2001 esophageal dysmotility - normal UGI 10/2007 by Dr Evette Cristal  Cards: Stress test abnml - 12/20/1992, Echo--nml - 12/20/1992  Cardiolyte--Neg - 04/20/1995, Cardiolite--Neg. - 04/19/2000, cardiolyte 2005: EF 80% no ischemia 2D ECHO July 2009 - nml 24hr holter monitor July 2009 NSR with occ PACs  Calaneal BMD -2.09 - 09/04/2002 Head MRI/MRA 06/2008 with  cervical spondylosis and mild white matter atrophy  Chronic kidney dz stage 2  GFR 60 on 5/06 DJD knee L>R  kidney stones `74 metabolic syndrome R humerus FX ` 98 has significant h/o poor toleration of several medications basal cell carcinoma right lower leg h/o shingles on scalp  Past Surgical History: Reviewed history from 11/15/2007 and no changes required. L meniscectomy 3/97  Family History: Reviewed history from 04/14/2010 and no changes required. father - CAD, HTN, died of CVA at 65 mother - thrombocythemia, CVA sister - lymphoma uncle - DM brother - skin cancer, aortic valve replacement brother - CAD in 31s  no family history of ovarian or breast or other abdominal  cancer that she can recall.    Social History: Reviewed history from 05/26/2007 and no changes required. Husband deceased.  Lives alone. Retired.  Has a child that lives out of town.  Her faith is Jehovah's Witness.  No tobacco, illicit, or alcohol use.  Review of Systems       per HPI  Physical Exam  General:  Well-developed,well-nourished,in no acute distress; alert,appropriate and cooperative throughout examination VS noted - hypertensive.  nervous. Additional Exam:  procedure note: R great toenail evaluated - still swollen on medial side, still tender, still ingrown.  no expressible pus today. informed consent obtained R great toe prepped with betadyne and alcohol anesthestized with 2% xylocaine with digital nerve block once adequate anesthesia tournequet applied reprepped with betadyne and alcohol nail place lifted off, nail clipped in center and rotated medially from each edge to remove ingrown portion. hemostasis easily achieved with minimal pressure bulky pressure dressing applied and tourniquet removed patient tolerated well <5cc blood loss.    Impression & Recommendations:  Problem # 1:  INGROWN TOENAIL (ICD-703.0) Assessment Unchanged  nail removed.  given handout on care of  toenail removal.   Orders: Nail Plate Removal, add-on (11732)  Complete Medication List: 1)  Omeprazole 40 Mg Cpdr (Omeprazole) .Marland Kitchen.. 1 by mouth two times a day 2)  Allopurinol 100 Mg Tabs (Allopurinol) .... Take 1 tablet by mouth once a day 3)  Bayer Childrens Aspirin 81 Mg Chew (Aspirin) .... Take 1 tablet by mouth once a day 4)  Lovastatin 40 Mg Tabs (Lovastatin) .... 2 tablets daily 5)  Demadex 20 Mg Tabs (Torsemide) .... Take 1 tablet by mouth once a day 6)  Claritin 10 Mg Tabs (Loratadine) .Marland Kitchen.. 1 tablet by mouth once a day 7)  Nasonex 50 Mcg/act Susp (Mometasone furoate) .... 2 sprays per nostril daily. dispense one bottle. 8)  Felodipine 10 Mg Tb24 (Felodipine) .... Take 2 tablet by mouth once a day 9)  Lisinopril-hydrochlorothiazide 20-12.5 Mg Tabs (Lisinopril-hydrochlorothiazide) .... One by mouth daily 10)  Daily Multiple Vitamins/min Tabs (Multiple vitamins-minerals) .... Once daily 11)  Natural  Mixed Tocopherols 400 Unit Caps (Vitamin e) .... 2 daily 12)  Zantac 150 Mg Tabs (Ranitidine hcl) .... One daily 13)  Ativan 1 Mg Tabs (Lorazepam) .Marland Kitchen.. 1-1.5 by mouth two times a day as needed 14)  Metoclopramide Hcl 5 Mg Tabs (Metoclopramide hcl) .Marland Kitchen.. 1 tablet three times a day prior to meals 15)  Glucotrol Xl 10 Mg Xr24h-tab (Glipizide) .... One daily 16)  Novolog Flexpen 100 Unit/ml Soln (Insulin aspart) .Marland Kitchen.. 15 units prior to evening meal dispense one month supply 17)  Lantus Solostar 100 Unit/ml Soln (Insulin glargine) .... Inject 36 unit subcutaneously once a day in the morning.  dispense one month supply 18)  Glucophage Xr 500 Mg Tb24 (Metformin hcl) .... One three times a day 19)  Freestyle Test Strp (Glucose blood) .... Use as directed to test 3 times a day 20)  Freestyle Lancets Misc (Lancets) .... Use as directed 21)  Novofine 31 31g X 6 Mm Misc (Insulin pen needle) .... Use as directed 22)  Lunesta 2 Mg Tabs (Eszopiclone) .... One tab by mouth at bedtime  Appended Document:  Orders Update    Clinical Lists Changes  Orders: Added new Service order of Nail avulsion, partial or complete (11730) - Signed

## 2011-01-19 NOTE — Progress Notes (Signed)
Summary: phn msg  Phone Note Call from Patient Call back at Home Phone [phone]   Caller: Patient Summary of Call: pt is returning call  Initial call taken by: De Nurse,  October 22, 2010 3:48 PM  Follow-up for Phone Call        Called pt. let her know I refilled novofine needles.  Says she needs 32G needles next time, not 31G.   [facility] (see [facility]).  Pt says she has been dizzy since yesterday.   Took the extra medicine for two days.  NO syncope, no chest pain. Told her to call if symptoms do not improve.  Follow-up by: Ardyth Gal MD,  October 22, 2010 5:08 PM

## 2011-01-19 NOTE — Progress Notes (Signed)
  Phone Note Outgoing Call   Call placed by: Jettie Pagan MD Summary of Call: Called patient to discuss lab and XRay results - informed all essentially wnl, except elevated glucose.  Degenerating fibroids noted on Xray. Discussed finding with Dr. Debroah Loop, OB/GYN.  In his opinion, these fibroids should not be causing patient's discomfort (2-3cm). Patient states discomfort improved.  Could be due to gas, bloating from gastroparesis. Patient will follow up as needed if symptoms worsen.

## 2011-01-19 NOTE — Progress Notes (Signed)
Summary: refill  Phone Note Refill Request Call back at Home Phone [phone] Message from:  Patient  Refills Requested: Medication #1:  DEMADEX 20 MG TABS Take 1 tablet by mouth once a day   Notes: pt is out  Medication #2:  LISINOPRIL-HYDROCHLOROTHIAZIDE 20-12.5 MG  TABS one by mouth daily pls let her know  Initial call taken by: De Nurse,  September 23, 2010 11:44 AM  Follow-up for Phone Call       Follow-up by: Golden Circle RN,  September 23, 2010 11:47 AM    Prescriptions: LISINOPRIL-HYDROCHLOROTHIAZIDE 20-12.5 MG  TABS (LISINOPRIL-HYDROCHLOROTHIAZIDE) one by mouth daily  #31 Tablet x 1   Entered by:   Golden Circle RN   Authorized by:   Ardyth Gal MD   Signed by:   Golden Circle RN on 09/23/2010   Method used:   Electronically to        [facility] [address] [phone]* (retail)       [address]       [place], [place]  [address]       Ph: [phone] or [phone]       Fax: [phone]   RxID:   [id number] DEMADEX 20 MG TABS (TORSEMIDE) Take 1 tablet by mouth once a day  #31 Tablet x 1   Entered by:   Golden Circle RN   Authorized by:   Ardyth Gal MD   Signed by:   Golden Circle RN on 09/23/2010   Method used:   Electronically to        [facility] [address] [phone]* (retail)       [address]       [place], [place]  [address]       Ph: [phone] or [phone]       Fax: [phone]   RxID:   [id number]

## 2011-01-19 NOTE — Progress Notes (Signed)
Summary: Refill  Phone Note Refill Request Call back at [phone]   Refills Requested: Medication #1:  NOVOFINE 31 31G X 6 MM  MISC use as directed Please send rx to pharmacy asap.  Pt all out of them presently. [facility]  Initial call taken by: Abundio Miu,  October 14, 2010 11:31 AM Caller: Patient Summary of Call: Need refills on her  Follow-up for Phone Call        Refilled perscription.  Called pt. to notify her.  Follow-up by: Ardyth Gal MD,  October 16, 2010 5:11 PM    Prescriptions: NOVOFINE 31 31G X 6 MM  MISC (INSULIN PEN NEEDLE) use as directed  #100 Each x 10   Entered and Authorized by:   Ardyth Gal MD   Signed by:   Ardyth Gal MD on 10/16/2010   Method used:   [facility]ronically to        [facility] [address] [phone]* (retail)       [address]       [name], [place]  [address]       Ph: [phone] or [phone]       Fax: [phone]   RxID:   [id number]

## 2011-01-19 NOTE — Progress Notes (Signed)
Summary: Rx Prob  Phone Note Call from Patient Call back at Home Phone [phone]   Caller: Patient Summary of Call: Pt says that Dr. Sandi Mealy was going to up the mgs of her Loraziapam, but when she got the rx home it was the same as before. Initial call taken by: Clydell Hakim,  April 14, 2010 2:34 PM  Follow-up for Phone Call        will forward to MD. Follow-up by: Theresia Lo RN,  April 14, 2010 4:54 PM  Additional Follow-up for Phone Call Additional follow up Details #1::        i did increase mgs.  it was previously 0.5mg  - increased to 1mg .  confirmed previous dose was 0.5mg  based on rf request from pharmacy. please fill current rx that she has.  Additional Follow-up by: Ancil Boozer  MD,  April 14, 2010 4:56 PM    Additional Follow-up for Phone Call Additional follow up Details #2::    patient states the rx she received today is for 0.5 mg. advised her to call her pharmacy because if she gave them the RX given  to her today the Rx is for 1 mg. she will call them now. Follow-up by: Theresia Lo RN,  April 14, 2010 5:10 PM

## 2011-01-19 NOTE — Assessment & Plan Note (Signed)
Summary: f/up,tcb   Vital Signs:  Patient profile:   70 year old female Height:      58.5 inches Weight:      188 pounds BMI:     38.76 Temp:     98.3 degrees F oral Pulse rate:   87 / minute BP sitting:   147 / 79  (left arm) Cuff size:   regular  Vitals Entered By: Garen Grams LPN (June 29, 2010 1:47 PM) CC: f/u dm, toenail and spot on leg Is Patient Diabetic? Yes Did you bring your meter with you today? No Pain Assessment Patient in pain? no        Primary Care Provider:  Ancil Boozer  MD  CC:  f/u dm and toenail and spot on leg.  History of Present Illness: DM: overall doing well. has been taking lantus 36 units daily and as needed novolog depending on sugars.  highest sugars have been which is rarely in mid 200s.  lowes 70s and was symptomatic with this.  overall feels good about her diabetes  toenail: overall feeling fine.  no pain except occasional twinge.  she is pleased.  she continues to wear an open toed shoe at this time.   spot on leg: first noticed on back of right leg a few weeks ago when it itched.  waited to see if it would go away but didn't.  would like me to evaluate it.   Habits & Providers  Alcohol-Tobacco-Diet     Tobacco Status: never  Current Medications (verified): 1)  Omeprazole 40 Mg Cpdr (Omeprazole) .Marland Kitchen.. 1 By Mouth Two Times A Day 2)  Allopurinol 100 Mg Tabs (Allopurinol) .... Take 1 Tablet By Mouth Once A Day 3)  Bayer Childrens Aspirin 81 Mg Chew (Aspirin) .... Take 1 Tablet By Mouth Once A Day 4)  Lovastatin 40 Mg Tabs (Lovastatin) .... 2 Tablets Daily 5)  Demadex 20 Mg Tabs (Torsemide) .... Take 1 Tablet By Mouth Once A Day 6)  Claritin 10 Mg Tabs (Loratadine) .Marland Kitchen.. 1 Tablet By Mouth Once A Day 7)  Nasonex 50 Mcg/act  Susp (Mometasone Furoate) .... 2 Sprays Per Nostril Daily. Dispense One Bottle. 8)  Felodipine 10 Mg Tb24 (Felodipine) .... Take 2 Tablet By Mouth Once A Day 9)  Lisinopril-Hydrochlorothiazide 20-12.5 Mg  Tabs  (Lisinopril-Hydrochlorothiazide) .... One By Mouth Daily 10)  Daily Multiple Vitamins/min   Tabs (Multiple Vitamins-Minerals) .... Once Daily 11)  Natural Mixed Tocopherols 400 Unit  Caps (Vitamin E) .... 2 Daily 12)  Zantac 150 Mg Tabs (Ranitidine Hcl) .... One Daily 13)  Ativan 1 Mg Tabs (Lorazepam) .Marland Kitchen.. 1-1.5 By Mouth Two Times A Day As Needed 14)  Metoclopramide Hcl 5 Mg Tabs (Metoclopramide Hcl) .Marland Kitchen.. 1 Tablet Three Times A Day Prior To Meals 15)  Glucotrol Xl 10 Mg Xr24h-Tab (Glipizide) .... One Daily 16)  Novolog Flexpen 100 Unit/ml Soln (Insulin Aspart) .Marland Kitchen.. 15 Units Prior To Evening Meal Dispense One Month Supply 17)  Lantus Solostar 100 Unit/ml Soln (Insulin Glargine) .... Inject 36 Unit Subcutaneously Once A Day in The Morning.  Dispense One Month Supply 18)  Glucophage Xr 500 Mg  Tb24 (Metformin Hcl) .... One Three Times A Day 19)  Freestyle Test   Strp (Glucose Blood) .... Use As Directed To Test 3 Times A Day 20)  Freestyle Lancets   Misc (Lancets) .... Use As Directed 21)  Novofine 31 31g X 6 Mm  Misc (Insulin Pen Needle) .... Use As Directed 22)  Lunesta  2 Mg Tabs (Eszopiclone) .... One Tab By Mouth At Bedtime  Allergies (verified): 1)  ! Beta Blockers 2)  Metoprolol Succinate (Metoprolol Succinate)  Past History:  Past medical, surgical, family and social histories (including risk factors) reviewed for relevance to current acute and chronic problems.  Past Medical History: Reviewed history from 05/22/2009 and no changes required. HTN: Has been on Lisinopril/HCTZ but reports it caused "mouth turning sour" so d/c'ed Walterine 09. Diovan HCT stopped 2008 due to "worsening swelling."  Lotensin HCT caused her to "not feel good" so she has switched back to Lisinopri HCTZ and tolerates it well as of august 2009  DMT2: Despite extensive education, pt uses DM medications as needed and is not consistent with dosing. A1Cs have been stable at goal, however, and she is without hypoglycemic  events.   GI: Abd U/S and EGD - nml - 11/19/1996 US - Abdominal--neg GB, + stones L kdney - 10/20/2001 Colonoscopy--nml - 12/20/2001 esophageal dysmotility - normal UGI 10/2007 by Dr Evette Cristal  Cards: Stress test abnml - 12/20/1992, Echo--nml - 12/20/1992  Cardiolyte--Neg - 04/20/1995, Cardiolite--Neg. 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 L meniscectomy 3/97  Family History: Reviewed history from 04/14/2010 and no changes required. father - CAD, HTN, died of CVA at 15 mother - thrombocythemia, CVA sister - lymphoma uncle - DM brother - skin cancer, aortic valve replacement brother - CAD in 79s  no family history of ovarian or breast or other abdominal  cancer that she can recall.    Social History: Reviewed history from 05/26/2007 and no changes required. Husband deceased.  Lives alone. Retired.  Has a child that lives out of town.  Her faith is Jehov[employer]'s [employer].  No tobacco, illicit, or alcohol use.  Review of Systems       per HPI  Physical Exam  General:  Well-developed,well-nourished,in no acute distress; alert,appropriate and cooperative throughout examination VS noted - hypertensive but improved from last visit Lungs:  Normal respiratory effort, chest expands symmetrically. Lungs are clear to auscultation, no crackles or wheezes. Heart:  Normal rate and regular rhythm. S1 and S2 normal without gallop, murmur, click, rub or other extra sounds. Extremities:  R great toenail  healing in well.  no signs of infection.   Skin:  on posterior right thigh is soft, shiny erythematous papule.  appears to be early scar tissue at this time   Impression & Recommendations:  Problem # 1:  DIABETES MELLITUS, II, COMPLICATIONS (ICD-250.92) Assessment Unchanged at goal.  continue to monitor her HTN and needs eye examination but otherwise despite her varying insulin regimen is staying under control.  no changes   Her updated medication list for this problem includes:    Bayer Childrens Aspirin 81 Mg Chew (Aspirin) .Marland Kitchen... Take 1 tablet by mouth once a day    Lisinopril-hydrochlorothiazide 20-12.5 Mg Tabs (Lisinopril-hydrochlorothiazide) ..... One by mouth daily    Glucotrol Xl 10 Mg Xr24h-tab (Glipizide) ..... One daily    Novolog Flexpen 100 Unit/ml  Soln (Insulin aspart) .Marland KitchenMarland KitchenMarland KitchenMarland Kitchen 15 units prior to evening meal dispense one month supply    Lantus Solostar 100 Unit/ml Soln (Insulin glargine) ..... Inject 36 unit subcutaneously once a day in the morning.  dispense one month supply    Glucophage Xr 500 Mg Tb24 (Metformin hcl) ..... One three times a day  Orders: A1C-[facility] (16109) [facility]- Est  Level 4 (60454)  Labs Reviewed: Creat: 0.85 (03/11/2010)   Microalbumin: negative (05/10/2008) Reviewed HgBA1c results: 6.5 (06/29/2010)  6.5 (03/11/2010)  Problem # 2:  SKIN LESION (ICD-709.9) Assessment: New  monitor over time.  if changing would remove it and send for biopsy  Orders: [facility]- Est  Level 4 (09811)  Problem # 3:  INGROWN TOENAIL (ICD-703.0) Assessment: Improved  doing very well.  continue the excellent care she is giving it  Orders: [facility]- Est  Level 4 (91478)  Complete Medication List: 1)  Omeprazole 40 Mg Cpdr (Omeprazole) .Marland Kitchen.. 1 by mouth two times a day 2)  Allopurinol 100 Mg Tabs (Allopurinol) .... Take 1 tablet by mouth once a day 3)  Bayer Childrens Aspirin 81 Mg Chew (Aspirin) .... Take 1 tablet by mouth once a day 4)  Lovastatin 40 Mg Tabs (Lovastatin) .... 2  tablets daily 5)  Demadex 20 Mg Tabs (Torsemide) .... Take 1 tablet by mouth once a day 6)  Claritin 10 Mg Tabs (Loratadine) .Marland Kitchen.. 1 tablet by mouth once a day 7)  Nasonex 50 Mcg/act Susp (Mometasone furoate) .... 2 sprays per nostril daily. dispense one bottle. 8)  Felodipine 10 Mg Tb24 (Felodipine) .... Take 2 tablet by mouth once a day 9)  Lisinopril-hydrochlorothiazide 20-12.5 Mg Tabs (Lisinopril-hydrochlorothiazide) .... One by mouth daily 10)  Daily Multiple Vitamins/min Tabs (Multiple vitamins-minerals) .... Once daily 11)  Natural Mixed Tocopherols 400 Unit Caps (Vitamin e) .... 2 daily 12)  Zantac 150 Mg Tabs (Ranitidine hcl) .... One daily 13)  Ativan 1 Mg Tabs (Lorazepam) .Marland Kitchen.. 1-1.5 by mouth two times a day as needed 14)  Metoclopramide Hcl 5 Mg Tabs (Metoclopramide hcl) .Marland Kitchen.. 1 tablet three times a day prior to meals 15)  Glucotrol Xl 10 Mg Xr24h-tab (Glipizide) .... One daily 16)  Novolog Flexpen 100 Unit/ml Soln (Insulin aspart) .Marland Kitchen.. 15 units prior to evening meal dispense one month supply 17)  Lantus Solostar 100 Unit/ml Soln (Insulin glargine) .... Inject 36 unit subcutaneously once a day in the morning.  dispense one month supply 18)  Glucophage Xr 500 Mg Tb24 (Metformin hcl) .... One three times a day 19)  Freestyle Test Strp (Glucose blood) .... Use as directed to test 3 times a day 20)  Freestyle Lancets Misc (Lancets) .... Use as directed 21)  Novofine 31 31g X 6 Mm Misc (Insulin pen needle) .... Use as directed 22)  Lunesta 2 Mg Tabs (Eszopiclone) .... One tab by mouth at bedtime  Patient Instructions: 1)  Your new Dr is Dr Lula Olszewski.  You will like her a lot. 2)  Please follow up for your diabetes in 3 months or of course sooner if needed. 3)  It has been a pleasure taking care of you!  Laboratory Results   Blood Tests   Date/Time Received: June 29, 2010 1:42 PM  Date/Time Reported: June 29, 2010 1:52 PM   HGBA1C: 6.5%   (Normal Range: Non-Diabetic - 3-6%    Control Diabetic - 6-8%)  Comments: ...........test performed by...........Marland KitchenGaren Grams, LPN entered by Terese Door, CMA         Prevention & Chronic Care  Immunizations   Influenza vaccine: Fluvax MCR  (10/24/2009)   Influenza vaccine due: 02/25/2010    Tetanus booster: 10/20/2002: Done.   Tetanus booster due: 10/20/2012    Pneumococcal vaccine: Done.  (10/20/2004)   Pneumococcal vaccine due: None    H. zoster vaccine: Not documented  Colorectal Screening   Hemoccult: Done.  (10/20/2006)   Hemoccult due: Not Indicated    Colonoscopy: Done.  (12/20/2001)   Colonoscopy due: 12/21/2011  Other Screening   Pap smear: Done.  (11/24/2005)   Pap smear due: Not Indicated    Mammogram: normal  (06/19/2008)   Mammogram due: 06/19/2009    DXA bone density scan: abnormal  (06/19/2008)   DXA scan due: 06/19/2010    Smoking status: never  (06/29/2010)  Diabetes Mellitus   HgbA1C: 6.5  (06/29/2010)   Hemoglobin A1C due: 05/28/2009    Eye exam: Not documented    Foot exam: yes  (03/11/2010)   High risk foot: Not documented   Foot care education: completed  (02/25/2009)   Foot exam due: 04/18/2008    Urine microalbumin/creatinine ratio: Not documented   Urine microalbumin/cr due: Not Indicated    Diabetes flowsheet reviewed?: Yes   Progress toward A1C goal: At goal  Lipids   Total Cholesterol: 187  (07/04/2009)   LDL: 115  (07/04/2009)   LDL Direct: 103  (03/11/2010)   HDL: 44  (07/04/2009)   Triglycerides: 141  (07/04/2009)    SGOT (AST): 17  (03/11/2010)   SGPT (ALT): 23  (03/11/2010)   Alkaline phosphatase: 48  (03/11/2010)   Total bilirubin: 0.3  (03/11/2010)    Lipid flowsheet reviewed?: Yes   Progress toward LDL goal: At goal  Hypertension   Last Blood Pressure: 147 / 79  (06/29/2010)   Serum creatinine: 0.85  (03/11/2010)   Serum potassium 4.7  (03/11/2010)    Hypertension flowsheet reviewed?: Yes   Progress toward BP goal:  Improved  Self-Management Support :   Personal Goals (by the next clinic visit) :     Personal A1C goal: 8  (09/16/2009)     Personal blood pressure goal: 130/80  (08/12/2009)     Personal LDL goal: 100  (08/12/2009)     Home glucose monitoring frequency: 2 times a day  (08/12/2009)    Diabetes self-management support: Written self-care plan  (09/16/2009)    Diabetes self-management support not done because: Good outcomes  (08/12/2009)   Last diabetes self-management training by diabetes educator: completed    Hypertension self-management support: BP self-monitoring log, Written self-care plan  (09/16/2009)    Hypertension self-management support not done because: Good outcomes  (06/29/2010)    Lipid self-management support: Not documented     Lipid self-management support not done because: Good outcomes  (06/29/2010)

## 2011-01-19 NOTE — Miscellaneous (Signed)
Summary: ROI  ROI   Imported By: Clydell Hakim 05/27/2010 15:45:23  _____________________________________________________________________  External Attachment:    Type:   Image     Comment:   External Document

## 2011-01-19 NOTE — Assessment & Plan Note (Signed)
Summary: still dizzy,df   Vital Signs:  Patient profile:   70 year old female Weight:      193.4 pounds Temp:     97.9 degrees F oral Pulse rate:   94 / minute Pulse rhythm:   regular BP sitting:   129 / 74  (left arm) Cuff size:   regular  Vitals Entered By: Loralee Pacas CMA (October 26, 2010 1:55 PM)  Primary Care Provider:  Ardyth Gal MD   History of Present Illness: 1.  Dizziness:  Took 40 mg Lisinopril on Wednesday and Thursday of last week, her usual dose is 20 mg daily.  Called by pharamacist on Thursday and told she had been given wrong medication.  States that pressures remained high at home even with this increased dose to 150s systolic.  Usually takes Lisinopril 20/HCTZ combination pill.  Told by pharmacist to go back to her 20 mg Lisinopril on Friday which she did.  States her "legs were rubbery" and felt very dizzy at that time.  Described dizziness as "heavy head" and when she turned her head too fast described "room spinning" sensation.  Feels like it is vastly improving since yesterday.  Saturday was worst day, called Emergency Line, told to go to Urgent Care, did not go, had daughter and son stay with her that night.  She is not driving.  Denies any history of vertigo or equilibrium problems in past.  Still taking Lorazepam.    ROS:  no palpitations, chest pain, shortness of breath, vision changes, hearing changes.  Current Problems (verified): 1)  Benign Positional Vertigo  (ICD-386.11) 2)  Personal History of Urinary Calculi  (ICD-V13.01) 3)  Dysuria  (ICD-788.1) 4)  Skin Lesion  (ICD-709.9) 5)  Ingrown Toenail  (ICD-703.0) 6)  Chest Wall Pain, Anterior  (ICD-786.52) 7)  Insomnia  (ICD-780.52) 8)  Diabetes Mellitus, II, Complications  (ICD-250.92) 9)  Gastroparesis  (ICD-536.3) 10)  Dyskinesia, Esophagus  (ICD-530.5) 11)  Fatigue  (ICD-780.79) 12)  Anxiety  (ICD-300.00) 13)  Hypertension, Benign Systemic  (ICD-401.1) 14)  Hyperlipidemia   (ICD-272.4) 15)  Leg Edema, Bilateral  (ICD-782.3) 16)  Carcinoma, Basal Cell  (ICD-173.9) 17)  Vaginitis, Atrophic  (ICD-627.3) 18)  Osteopenia  (ICD-733.90) 19)  Osteoarthritis, Multi Sites  (ICD-715.98) 20)  Obesity, Nos  (ICD-278.00) 21)  Gout, Acute  (ICD-274.0) 22)  Gastroesophageal Reflux, No Esophagitis  (ICD-530.81) 23)  Systolic Murmur  (ICD-785.2)  Current Medications (verified): 1)  Omeprazole 40 Mg Cpdr (Omeprazole) .Marland Kitchen.. 1 By Mouth Two Times A Day 2)  Allopurinol 100 Mg Tabs (Allopurinol) .... Take 1 Tablet By Mouth Once A Day 3)  Bayer Childrens Aspirin 81 Mg Chew (Aspirin) .... Take 1 Tablet By Mouth Once A Day 4)  Lovastatin 40 Mg Tabs (Lovastatin) .... 2 Tablets Daily 5)  Demadex 20 Mg Tabs (Torsemide) .... Take 1 Tablet By Mouth Once A Day 6)  Claritin 10 Mg Tabs (Loratadine) .Marland Kitchen.. 1 Tablet By Mouth Once A Day 7)  Nasonex 50 Mcg/act  Susp (Mometasone Furoate) .... 2 Sprays Per Nostril Daily. Dispense One Bottle. 8)  Felodipine 10 Mg Tb24 (Felodipine) .... Take 2 Tablet By Mouth Once A Day 9)  Lisinopril-Hydrochlorothiazide 20-12.5 Mg  Tabs (Lisinopril-Hydrochlorothiazide) .... One By Mouth Daily 10)  Daily Multiple Vitamins/min   Tabs (Multiple Vitamins-Minerals) .... Once Daily 11)  Natural Mixed Tocopherols 400 Unit  Caps (Vitamin E) .... 2 Daily 12)  Zantac 150 Mg Tabs (Ranitidine Hcl) .... One Daily 13)  Ativan 1 Mg  Tabs (Lorazepam) .Marland Kitchen.. 1-1.5 By Mouth Two Times A Day As Needed 14)  Metoclopramide Hcl 5 Mg Tabs (Metoclopramide Hcl) .Marland Kitchen.. 1 Tablet Three Times A Day Prior To Meals 15)  Glucotrol Xl 10 Mg Xr24h-Tab (Glipizide) .... One Daily 16)  Novolog Flexpen 100 Unit/ml Soln (Insulin Aspart) .Marland Kitchen.. 15 Units Prior To Evening Meal Dispense One Month Supply 17)  Lantus Solostar 100 Unit/ml Soln (Insulin Glargine) .... Inject 36 Unit Subcutaneously Once A Day in The Morning.  Dispense One Month Supply 18)  Glucophage Xr 500 Mg  Tb24 (Metformin Hcl) .... One Three Times A  Day 19)  Freestyle Test   Strp (Glucose Blood) .... Use As Directed To Test 3 Times A Day 20)  Freestyle Lancets   Misc (Lancets) .... Use As Directed 21)  Novofine 31 31g X 6 Mm  Misc (Insulin Pen Needle) .... Use As Directed 22)  Lunesta 2 Mg Tabs (Eszopiclone) .... One Tab By Mouth At Bedtime  Allergies (verified): 1)  ! Beta Blockers 2)  Metoprolol Succinate (Metoprolol Succinate)  Past History:  Past medical, surgical, family and social histories (including risk factors) reviewed for relevance to current acute and chronic problems.  Past Medical History: Reviewed history from 05/22/2009 and no changes required. HTN: Has been on Lisinopril/HCTZ but reports it caused "mouth turning sour" so d/c'ed [place] 09. Diovan HCT stopped 2008 due to "worsening swelling."  Lotensin HCT caused her to "not feel good" so she has switched back to Lisinopri HCTZ and tolerates it well as of august 2009  DMT2: Despite extensive education, pt uses DM medications as needed and is not consistent with dosing. A1Cs have been stable at goal, however, and she is without hypoglycemic events.   GI: Abd U/S and EGD - nml - 11/19/1996 US - Abdominal--neg GB, + stones L kdney - 10/20/2001 Colonoscopy--nml - 12/20/2001 esophageal dysmotility - normal UGI 10/2007 by Dr Evette Cristal  Cards: Stress test abnml - 12/20/1992, Echo--nml - 12/20/1992  Cardiolyte--Neg - 04/20/1995, Cardiolite--Neg. - 04/19/2000, cardiolyte 2005: EF 80% no ischemia 2D ECHO July 2009 - nml 24hr holter monitor July 2009 NSR with occ PACs  Calaneal BMD -2.09 - 09/04/2002 Head MRI/MRA 06/2008 with cervical spondylosis and mild white matter atrophy  Chronic kidney dz stage 2  GFR 60 on 5/06 DJD knee L>R  kidney stones `74 metabolic syndrome R humerus FX ` 98 has significant h/o poor toleration of several medications basal cell carcinoma right lower leg h/o shingles on scalp  Past Surgical History: Reviewed history from 11/15/2007 and no changes  required. L meniscectomy 3/97  Family History: Reviewed history from 04/14/2010 and no changes required. father - CAD, HTN, died of CVA at 81 mother - thrombocythemia, CVA sister - lymphoma uncle - DM brother - skin cancer, aortic valve replacement brother - CAD in 60s  no family history of ovarian or breast or other abdominal  cancer that she can recall.    Social History: Reviewed history from 05/26/2007 and no changes required. Husband deceased.  Lives alone. Retired.  Has a child that lives out of town.  Her faith is Je[employer].  No tobacco, illicit, or alcohol use.  Physical Exam  General:  Vital signs reviewed. Well-developed, well-nourished patient in NAD.  Awake and cooperative  Head:  normocephalic and atraumatic.   Eyes:  PERRL, EOMI, no nystagmus noted.   Ears:  External ear exam shows no significant lesions or deformities.  Otoscopic examination reveals clear canals, tympanic membranes are intact bilaterally without bulging,  retraction, inflammation or discharge. Hearing is grossly normal bilaterally. Mouth:  Oral mucosa pink and moist Dentition:    Lungs:  clear to auscultation bilaterally without wheezing, rales, or rhonchi.  Normal work of breathing'  Heart:  RRR, Grade II/VI SEM heard best at LUSB Neurologic:  Vertiginous symptoms reproduced with head movement.  Unable to perform Dix-Hallpike maneuvers secondary to patient comfort.  Otherwise, alert & oriented X3 and cranial nerves II-XII intact.  No focal deficits except some hesitancy in ambulation.  Ambulating with cane.     Impression & Recommendations:  Problem # 1:  BENIGN POSITIONAL VERTIGO (ICD-386.11) Assessment New Most likely diagnosis is BPPV.  Less likely reaction to medication change, only increased dose from 20 to 40 mg and held HCTZ during this episode.  Cannot overlook medication as potential cause, however.   Will treat with patient-performed Epley maneuvers.  Gave printed handout and  instructions on this, also plan to refer to Vestibular Therapy for help with symptoms.   Patient with insomnia, see below.  Will provide Meclizine more for sleep rather than vertigo relief.   central vs peripheral lesions dizziness, held Lunesta  Her updated medication list for this problem includes:    Claritin 10 Mg Tabs (Loratadine) .Marland Kitchen... 1 tablet by mouth once a day  Orders: [facility]- Est Level  3 (04540) Physical Therapy Referral (PT)  Problem # 2:  INSOMNIA (ICD-780.52) Holding Lunesta while she is experiencing these symptoms.  Prescribed Meclizine for sleep and possible resolution of vertigo as above.  Once symptoms have resolved, could consider restarting this medicine.  Defer to PCP.  Her updated medication list for this problem includes:    Lunesta 2 Mg Tabs (Eszopiclone) ..... One tab by mouth at bedtime  Problem # 3:  ANXIETY (ICD-300.00) Patient desired refill for Lorazepam.  States it has helped with vertiginous symptoms.   Her updated medication list for this problem includes:    Ativan 1 Mg Tabs (Lorazepam) .Marland Kitchen... 1-1.5 by mouth two times a day as needed  Complete Medication List: 1)  Omeprazole 40 Mg Cpdr (Omeprazole) .Marland Kitchen.. 1 by mouth two times a day 2)  Allopurinol 100 Mg Tabs (Allopurinol) .... Take 1 tablet by mouth once a day 3)  Bayer Childrens Aspirin 81 Mg Chew (Aspirin) .... Take 1 tablet by mouth once a day 4)  Lovastatin 40 Mg Tabs (Lovastatin) .... 2 tablets daily 5)  Demadex 20 Mg Tabs (Torsemide) .... Take 1 tablet by mouth once a day 6)  Claritin 10 Mg Tabs (Loratadine) .Marland Kitchen.. 1 tablet by mouth once a day 7)  Nasonex 50 Mcg/act Susp (Mometasone furoate) .... 2 sprays per nostril daily. dispense one bottle. 8)  Felodipine 10 Mg Tb24 (Felodipine) .... Take 2 tablet by mouth once a day 9)  Lisinopril-hydrochlorothiazide 20-12.5 Mg Tabs (Lisinopril-hydrochlorothiazide) .... One by mouth daily 10)  Daily Multiple Vitamins/min Tabs (Multiple vitamins-minerals) .... Once  daily 11)  Natural Mixed Tocopherols 400 Unit Caps (Vitamin e) .... 2 daily 12)  Zantac 150 Mg Tabs (Ranitidine hcl) .... One daily 13)  Ativan 1 Mg Tabs (Lorazepam) .Marland Kitchen.. 1-1.5 by mouth two times a day as needed 14)  Metoclopramide Hcl 5 Mg Tabs (Metoclopramide hcl) .Marland Kitchen.. 1 tablet three times a day prior to meals 15)  Glucotrol Xl 10 Mg Xr24h-tab (Glipizide) .... One daily 16)  Novolog Flexpen 100 Unit/ml Soln (Insulin aspart) .Marland Kitchen.. 15 units prior to evening meal dispense one month supply 17)  Lantus Solostar 100 Unit/ml Soln (Insulin glargine) .... Inject  36 unit subcutaneously once a day in the morning.  dispense one month supply 18)  Glucophage Xr 500 Mg Tb24 (Metformin hcl) .... One three times a day 19)  Freestyle Test Strp (Glucose blood) .... Use as directed to test 3 times a day 20)  Freestyle Lancets Misc (Lancets) .... Use as directed 21)  Novofine 31 31g X 6 Mm Misc (Insulin pen needle) .... Use as directed 22)  Lunesta 2 Mg Tabs (Eszopiclone) .... One tab by mouth at bedtime  Patient Instructions: 1)  Recommend that you continue not to drive.   2)  You most likely have a type of vertigo caused by loose "stones" or crystals in your ear system.  3)  Lie down on your right side for about 30 seconds, then flip and lie on your left side.  This will make you dizzy, make sure someone is in the room with you.   4)  Lay like this for about 30 seconds, then sit up, still facing the same direction.   5)  Do this several times a day, always with assistance.   6)  We will refer you to Vestibular Therapy, a type of physical therapy.     Orders Added: 1)  [facility]- Est Level  3 [04540] 2)  Physical Therapy Referral [PT]

## 2011-01-19 NOTE — Progress Notes (Signed)
Summary: dizziness: told her to go to [facility]  [place] called [phone]  Pharmacist gave her someone elses medication. Took Lisinopril 40 mg for Wed and Thursday. Pt is now having dizziness, can't walk (holding onto the walls). Using the cane. Daughter-in-law is with her.  Sugar was 145 this morning. Pt has been eating. No fevers or chills. dizziness happens when she turns her head when laying, sitting is ok. Legs felt "rubbery" on Wednesday but doesn't feel weak. No vomiting but says her stomach feels funny.   161/79 today 154/71 yesterday BP was normal on Wednesday.   Advised pt to be evaluated at the [facility]. I suspect that she has some Benigh positional vertigo. might benifit from meclizine but since I can't evaluate her and she will be alone tonight she needs to be seen today. Concern for falling tonight when she's alone.   She wants her PCP to call her on Monday if she's in the office. Told her I would pass along the message. Jamie Brookes MD  October 24, 2010 4:05 PM

## 2011-01-19 NOTE — Progress Notes (Signed)
Summary: refill  Phone Note Refill Request Call back at Home Phone [phone] Message from:  Patient  Refills Requested: Medication #1:  LANTUS SOLOSTAR 100 UNIT/ML SOLN Inject 36 unit subcutaneously once a day in the morning.  Dispense one month supply Next Appointment Scheduled: 03/11/10 Initial call taken by: De Nurse,  February 25, 2010 9:58 AM    Prescriptions: LANTUS SOLOSTAR 100 UNIT/ML SOLN (INSULIN GLARGINE) Inject 36 unit subcutaneously once a day in the morning.  Dispense one month supply  #1 x 11   Entered and Authorized by:   Myrtie Soman  MD   Signed by:   Myrtie Soman  MD on 02/25/2010   Method used:   Electronically to        [facility] [address] [facility] [address] [address] [id number]* (retail)       [address]       [place], [place]  [address]       Ph: [phone] or [phone]       Fax: [phone]   RxID:   [id number]

## 2011-01-19 NOTE — Assessment & Plan Note (Signed)
Summary: f/u dm,df   Vital Signs:  Patient profile:   70 year old female Height:      58.5 inches Weight:      192.1 pounds BMI:     39.61 Temp:     97.8 degrees F oral Pulse rate:   89 / minute BP sitting:   130 / 74  (right arm) Cuff size:   regular  Vitals Entered By: Garen Grams LPN (March 11, 2010 11:39 AM) CC: multiple complaints Is Patient Diabetic? Yes Did you bring your meter with you today? Yes Pain Assessment Patient in pain? no        Primary Care Provider:  Ancil Boozer  MD  CC:  multiple complaints.  History of Present Illness: DM: has been varying her short acting insulin around.  usually taking 10-12 units at dinner.  taking lantus as prescribed.  has had very rare low sugars  - only one in her records of over 1 month of a low (55).  she did get sweaty and was able to get sugar up easily with food/sugar.  otherwise without concerns with DM.  right side of chest: sore at times to touch.  was intially on left but then moved to right.  unsure of cause.  states she checked her breast and didn't feel any lumps or bumps (declines my examination today or getting mammogram).   ingrown toenail: history of problems with this - currently R great toenail is ingrown and painful.  has tried creams, neosporin to try to help but without relief.    stomach: states it just "doesn't feel good".  feels bloated, swollen.  decreased appetite but no vomiting.  bowels moving but occasionlly is constpiated.  no blood in stool.  thinks her stomach did better on protonix.  hasn't tried probiotics in pill form byt has been eating ativia to try to help.   fatigue: has noticed for a few months - denies depression.    Habits & Providers  Alcohol-Tobacco-Diet     Tobacco Status: never  Current Medications (verified): 1)  Protonix 40 Mg Tbec (Pantoprazole Sodium) .Marland Kitchen.. 1 By Mouth Once Daily For Stomach. 2)  Allopurinol 100 Mg Tabs (Allopurinol) .... Take 1 Tablet By Mouth Once A Day 3)   Bayer Childrens Aspirin 81 Mg Chew (Aspirin) .... Take 1 Tablet By Mouth Once A Day 4)  Lovastatin 40 Mg Tabs (Lovastatin) .... 2 Tablets Daily 5)  Demadex 20 Mg Tabs (Torsemide) .... Take 1 Tablet By Mouth Once A Day 6)  Claritin 10 Mg Tabs (Loratadine) .Marland Kitchen.. 1 Tablet By Mouth Once A Day 7)  Nasonex 50 Mcg/act  Susp (Mometasone Furoate) .... 2 Sprays Per Nostril Daily. Dispense One Bottle. 8)  Felodipine 10 Mg Tb24 (Felodipine) .... Take 2 Tablet By Mouth Once A Day 9)  Lisinopril-Hydrochlorothiazide 20-12.5 Mg  Tabs (Lisinopril-Hydrochlorothiazide) .... One By Mouth Daily 10)  Daily Multiple Vitamins/min   Tabs (Multiple Vitamins-Minerals) .... Once Daily 11)  Natural Mixed Tocopherols 400 Unit  Caps (Vitamin E) .... 2 Daily 12)  Zantac 150 Mg Tabs (Ranitidine Hcl) .... One Daily 13)  Ativan 0.5 Mg Tabs (Lorazepam) .Marland Kitchen.. 1-2 Tablets Two Times A Day As Needed For Anxiety 14)  Metoclopramide Hcl 5 Mg Tabs (Metoclopramide Hcl) .Marland Kitchen.. 1 Tablet Three Times A Day Prior To Meals 15)  Glucotrol Xl 10 Mg Xr24h-Tab (Glipizide) .... One Daily 16)  Novolog Flexpen 100 Unit/ml Soln (Insulin Aspart) .Marland Kitchen.. 15 Units Prior To Evening Meal Dispense One Month Supply 17)  Lantus Solostar 100 Unit/ml Soln (Insulin Glargine) .... Inject 36 Unit Subcutaneously Once A Day in The Morning.  Dispense One Month Supply 18)  Glucophage Xr 500 Mg  Tb24 (Metformin Hcl) .... One Three Times A Day 19)  Freestyle Test   Strp (Glucose Blood) .... Use As Directed To Test 3 Times A Day 20)  Freestyle Lancets   Misc (Lancets) .... Use As Directed 21)  Novofine 31 31g X 6 Mm  Misc (Insulin Pen Needle) .... Use As Directed 22)  Lunesta 2 Mg Tabs (Eszopiclone) .... One Tab By Mouth At Bedtime  Allergies (verified): 1)  ! Beta Blockers 2)  Metoprolol Succinate (Metoprolol Succinate)  Past History:  Past medical, surgical, family and social histories (including risk factors) reviewed for relevance to current acute and chronic  problems.  Review of Systems       per HPI.  denies weight change.  denies palpitations. denies shortness of breath  Physical Exam  General:  Well-developed,well-nourished,in no acute distress; alert,appropriate and cooperative throughout examination Chest Wall:  tender to palpation just under clavical on R side.  no masses, no erythema.   Lungs:  Normal respiratory effort, chest expands symmetrically. Lungs are clear to auscultation, no crackles or wheezes. Heart:  Normal rate and regular rhythm. S1 and S2 normal without gallop, murmur, click, rub or other extra sounds. Abdomen:  soft, non-tender, normal bowel sounds, and no distention.  slightly bloated.   Extremities:  R great toenail ingrown with inflammation, redness on medial side of nail.   Psych:  flat affect  Diabetes Management Exam:    Foot Exam (with socks and/or shoes not present):       Sensory-Pinprick/Light touch:          Left medial foot (L-4): normal          Left dorsal foot (L-5): normal          Left lateral foot (S-1): normal          Right medial foot (L-4): normal          Right dorsal foot (L-5): normal          Right lateral foot (S-1): normal       Sensory-Monofilament:          Left foot: normal          Right foot: normal       Inspection:          Left foot: normal          Right foot: abnormal             Comments: ingrown nail that appears inflammated       Nails:          Left foot: ingrown          Right foot: ingrown    Impression & Recommendations:  Problem # 1:  DIABETES MELLITUS, II, COMPLICATIONS (ICD-250.92) Assessment Unchanged  A1C good. continue current therapies.   Her updated medication list for this problem includes:    Bayer Childrens Aspirin 81 Mg Chew (Aspirin) .Marland Kitchen... Take 1 tablet by mouth once a day    Lisinopril-hydrochlorothiazide 20-12.5 Mg Tabs (Lisinopril-hydrochlorothiazide) ..... One by mouth daily    Glucotrol Xl 10 Mg Xr24h-tab (Glipizide) ..... One daily     Novolog Flexpen 100 Unit/ml Soln (Insulin aspart) .Marland KitchenMarland KitchenMarland KitchenMarland Kitchen 15 units prior to evening meal dispense one month supply    Lantus Solostar 100 Unit/ml Soln (Insulin glargine) ..... Inject 36 unit  subcutaneously once a day in the morning.  dispense one month supply    Glucophage Xr 500 Mg Tb24 (Metformin hcl) ..... One three times a day  Orders: A1C-[facility] (96045) [facility]- Est  Level 4 (40981)  Labs Reviewed: Creat: 0.83 (07/04/2009)   Microalbumin: negative (05/10/2008) Reviewed HgBA1c results: 6.5 (03/11/2010)  6.3 (09/16/2009)  Problem # 2:  INGROWN TOENAIL (ICD-703.0) Assessment: New recommended removal of R great toenail with nail plate ablation.  she states she will think about it.  doesn't appear to need antibiotcs at this time.  Orders: [facility]- Est  Level 4 (99214)  Problem # 3:  ABDOMINAL PAIN, UNSPECIFIED SITE (ICD-789.00) Assessment: Deteriorated not pain persey but discomfort.  she says better with protonix will try to switch to this since she has failed omeprazole.  likely at least in part to gastroparesis as well.  encouraged trying probiotics.    Her updated medication list for this problem includes:    Metoclopramide Hcl 5 Mg Tabs (Metoclopramide hcl) .Marland Kitchen... 1 tablet three times a day prior to meals  Orders: [facility]- Est  Level 4 ([address]147)  Problem # 4:  CHEST WALL PAIN, ANTERIOR (WGN-562.13) Assessment: New MSK pain.  ? fibromyalgia/trigger point.  reassurred patient that doesn't appear to be worrisome.  encouraged mammogram   Her updated medication list for this problem includes:    Bayer Childrens Aspirin 81 Mg Chew (Aspirin) .Marland Kitchen... Take 1 tablet by mouth once a day  Orders: [facility]- Est  Level 4 (08657)  Problem # 5:  FATIGUE (ICD-780.79) Assessment: Deteriorated get basic lab work though i suspect she may be deprssed.  will monitor closely with follow up  Orders: CBC-[facility] (84696) Comp Met-[facility] ([id number]) TSH-[facility] ([id number]) [facility]- Est  Level 4 (64403)  Complete Medication  List: 1)  Protonix 40 Mg Tbec (Pantoprazole sodium) .Marland Kitchen.. 1 by mouth once daily for stomach. 2)  Allopurinol 100 Mg Tabs (Allopurinol) .... Take 1 tablet by mouth once a day 3)  Bayer Childrens Aspirin 81 Mg Chew (Aspirin) .... Take 1 tablet by mouth once a day 4)  Lovastatin 40 Mg Tabs (Lovastatin) .... 2 tablets daily 5)  Demadex 20 Mg Tabs (Torsemide) .... Take 1 tablet by mouth once a day 6)  Claritin 10 Mg Tabs (Loratadine) .Marland Kitchen.. 1 tablet by mouth once a day 7)  Nasonex 50 Mcg/act Susp (Mometasone furoate) .... 2 sprays per nostril daily. dispense one bottle. 8)  Felodipine 10 Mg Tb24 (Felodipine) .... Take 2 tablet by mouth once a day 9)  Lisinopril-hydrochlorothiazide 20-12.5 Mg Tabs (Lisinopril-hydrochlorothiazide) .... One by mouth daily 10)  Daily Multiple Vitamins/min Tabs (Multiple vitamins-minerals) .... Once daily 11)  Natural Mixed Tocopherols 400 Unit Caps (Vitamin e) .... 2 daily 12)  Zantac 150 Mg Tabs (Ranitidine hcl) .... One daily 13)  Ativan 0.5 Mg Tabs (Lorazepam) .Marland Kitchen.. 1-2 tablets two times a day as needed for anxiety 14)  Metoclopramide Hcl 5 Mg Tabs (Metoclopramide hcl) .Marland Kitchen.. 1 tablet three times a day prior to meals 15)  Glucotrol Xl 10 Mg Xr24h-tab (Glipizide) .... One daily 16)  Novolog Flexpen 100 Unit/ml Soln (Insulin aspart) .Marland Kitchen.. 15 units prior to evening meal dispense one month supply 17)  Lantus Solostar 100 Unit/ml Soln (Insulin glargine) .... Inject 36 unit subcutaneously once a day in the morning.  dispense one month supply 18)  Glucophage Xr 500 Mg Tb24 (Metformin hcl) .... One three times a day 19)  Freestyle Test Strp (Glucose blood) .... Use as directed to test 3 times a day  20)  Freestyle Lancets Misc (Lancets) .... Use as directed 21)  Novofine 31 31g X 6 Mm Misc (Insulin pen needle) .... Use as directed 22)  Lunesta 2 Mg Tabs (Eszopiclone) .... One tab by mouth at bedtime  Other Orders: Direct LDL-[facility] ([id number])  Patient Instructions: 1)  Please  follow up in 4-6 weeks to see how you are feeling overall. 2)  Please schedule an appointment to get your ingrown toenail removed.  3)  Start taking probiotics daily to try to help your stomach. 4)  Your A1C was 6.5% today.  Prescriptions: PROTONIX 40 MG TBEC (PANTOPRAZOLE SODIUM) 1 by mouth once daily for stomach.  #32 x 6   Entered and Authorized by:   Ancil Boozer  MD   Signed by:   Ancil Boozer  MD on 03/11/2010   Method used:   [facility]        [facility]  [address] [phone][facility] (retail)       [address]       [place], [place]  [address]       Ph: [phone] or [phone]       Fax: [phone]   RxID:   [id number]   Laboratory Results   Blood Tests   Date/Time Received: March 11, 2010 11:30 AM  Date/Time Reported: March 11, 2010 11:44 AM   HGBA1C: 6.5%   (Normal Range: Non-Diabetic - 3-6%   Control Diabetic - 6-8%)  Comments: ...........test performed by..........Marland KitchenAsha Benton,LPN entered by Terese Door, CMA

## 2011-01-19 NOTE — Assessment & Plan Note (Signed)
Summary: F/U/KH   Vital Signs:  Patient profile:   70 year old female Height:      58.5 inches Weight:      195.2 pounds BMI:     40.25 Temp:     98.1 degrees F Pulse rate:   80 / minute BP sitting:   121 / 74  Vitals Entered By: Theresia Lo RN (May 19, 2010 10:19 AM) CC: f/u stomach issues, toenail Is Patient Diabetic? No Pain Assessment Patient in pain? no        Primary Care Provider:  Ancil Boozer  MD  CC:  f/u stomach issues and toenail.  History of Present Illness: stomach: feeling slightly better - less pain, moving bowels well, less indigestion.  still bloating some and she has also noticed she is retaining more fluid that typical - she wonders if she needs to have a as needed dose of diuretic (demedex?).  taking zantac and probiotic as well which she thinks is helping  toenail.  great toe on right foot continues to give her problems on medial side of nail.  continues to be very tender.  she is trying to cut it.  denies any discharge from it.  has noticed swelling, warmth and erythema.  has been soaking it and putting abx cream on it with only minimal help.  she thinks it may be time to consider getting the toenail taken off  Habits & Providers  Alcohol-Tobacco-Diet     Tobacco Status: never  Current Medications (verified): 1)  Omeprazole 40 Mg Cpdr (Omeprazole) .Marland Kitchen.. 1 By Mouth Two Times A Day 2)  Allopurinol 100 Mg Tabs (Allopurinol) .... Take 1 Tablet By Mouth Once A Day 3)  Bayer Childrens Aspirin 81 Mg Chew (Aspirin) .... Take 1 Tablet By Mouth Once A Day 4)  Lovastatin 40 Mg Tabs (Lovastatin) .... 2 Tablets Daily 5)  Demadex 20 Mg Tabs (Torsemide) .... Take 1 Tablet By Mouth Once A Day 6)  Claritin 10 Mg Tabs (Loratadine) .Marland Kitchen.. 1 Tablet By Mouth Once A Day 7)  Nasonex 50 Mcg/act  Susp (Mometasone Furoate) .... 2 Sprays Per Nostril Daily. Dispense One Bottle. 8)  Felodipine 10 Mg Tb24 (Felodipine) .... Take 2 Tablet By Mouth Once A Day 9)   Lisinopril-Hydrochlorothiazide 20-12.5 Mg  Tabs (Lisinopril-Hydrochlorothiazide) .... One By Mouth Daily 10)  Daily Multiple Vitamins/min   Tabs (Multiple Vitamins-Minerals) .... Once Daily 11)  Natural Mixed Tocopherols 400 Unit  Caps (Vitamin E) .... 2 Daily 12)  Zantac 150 Mg Tabs (Ranitidine Hcl) .... One Daily 13)  Ativan 1 Mg Tabs (Lorazepam) .Marland Kitchen.. 1-1.5 By Mouth Two Times A Day As Needed 14)  Metoclopramide Hcl 5 Mg Tabs (Metoclopramide Hcl) .Marland Kitchen.. 1 Tablet Three Times A Day Prior To Meals 15)  Glucotrol Xl 10 Mg Xr24h-Tab (Glipizide) .... One Daily 16)  Novolog Flexpen 100 Unit/ml Soln (Insulin Aspart) .Marland Kitchen.. 15 Units Prior To Evening Meal Dispense One Month Supply 17)  Lantus Solostar 100 Unit/ml Soln (Insulin Glargine) .... Inject 36 Unit Subcutaneously Once A Day in The Morning.  Dispense One Month Supply 18)  Glucophage Xr 500 Mg  Tb24 (Metformin Hcl) .... One Three Times A Day 19)  Freestyle Test   Strp (Glucose Blood) .... Use As Directed To Test 3 Times A Day 20)  Freestyle Lancets   Misc (Lancets) .... Use As Directed 21)  Novofine 31 31g X 6 Mm  Misc (Insulin Pen Needle) .... Use As Directed 22)  Lunesta 2 Mg Tabs (Eszopiclone) .Marland KitchenMarland KitchenMarland Kitchen  One Tab By Mouth At Bedtime  Allergies (verified): 1)  ! Beta Blockers 2)  Metoprolol Succinate (Metoprolol Succinate)  Past History:  Past medical, surgical, family and social histories (including risk factors) reviewed for relevance to current acute and chronic problems.  Past Medical History: Reviewed history from 05/22/2009 and no changes required. HTN: Has been on Lisinopril/HCTZ but reports it caused "mouth turning sour" so d/c'ed Dajanae 09. Diovan HCT stopped 2008 due to "worsening swelling."  Lotensin HCT caused her to "not feel good" so she has switched back to Lisinopri HCTZ and tolerates it well as of august 2009  DMT2: Despite extensive education, pt uses DM medications as needed and is not consistent with dosing. A1Cs have been stable at  goal, however, and she is without hypoglycemic events.   GI: Abd U/S and EGD - nml - 11/19/1996 US - Abdominal--neg GB, + stones L kdney - 10/20/2001 Colonoscopy--nml - 12/20/2001 esophageal dysmotility - normal UGI 10/2007 by Dr Evette Cristal  Cards: Stress test abnml - 12/20/1992, Echo--nml - 12/20/1992  Cardiolyte--Neg - 04/20/1995, Cardiolite--Neg. 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 L meniscectomy 3/97  Family History: Reviewed history from 04/14/2010 and no changes required. father - CAD, HTN, died of CVA at 44 mother - thrombocythemia, CVA sister - lymphoma uncle - DM brother - skin cancer, aortic valve replacement brother - CAD in 52s  no family history of ovarian or breast or other abdominal  cancer that she can recall.    Social History: Reviewed history from 05/26/2007 and no changes required. Husband deceased.  Lives alone. Retired.  Has a child that lives out of town.  Her faith is Jehovah's Witness.  No tobacco, illicit, or alcohol use.  Review of Systems       per HPI  Physical Exam  General:  Well-developed,well-nourished,in no acute distress; alert,appropriate and cooperative throughout examination VS reviewed Abdomen:  soft, non-tender, normal bowel sounds. slightly bloated.   Extremities:  R great toenail ingrown with inflammation, redness on medial side of nail.     Impression & Recommendations:  Problem # 1:  ABDOMINAL PAIN, UNSPECIFIED SITE  (ICD-789.00) Assessment Improved  CT scan results reviewed with patient (essentially main finding is uterine fibroids which are not dangerous).  continue current regimen, monitor for now.  if still staying fluid overloaded may consider as needed dose of diuretic  Her updated medication list for this problem includes:    Metoclopramide Hcl 5 Mg Tabs (Metoclopramide hcl) .Marland Kitchen... 1 tablet three times a day prior to meals  Orders: [facility]- Est Level  3 (47829)  Problem # 2:  INGROWN TOENAIL (ICD-703.0) Assessment: Deteriorated  recommended removal of nail.  she plans to schedule.  to call if things get worse as she didn't want to get it done today since she has another doctors appt later and she is planning to go out of town this weekend.  doesn't need systemic abx at this point.  continue soaks and as needed topical ointments at  this time  Orders: [facility]- Est Level  3 (56387)  Complete Medication List: 1)  Omeprazole 40 Mg Cpdr (Omeprazole) .Marland Kitchen.. 1 by mouth two times a day 2)  Allopurinol 100 Mg Tabs (Allopurinol) .... Take 1 tablet by mouth once a day 3)  Bayer Childrens Aspirin 81 Mg Chew (Aspirin) .... Take 1 tablet by mouth once a day 4)  Lovastatin 40 Mg Tabs (Lovastatin) .... 2 tablets daily 5)  Demadex 20 Mg Tabs (Torsemide) .... Take 1 tablet by mouth once a day 6)  Claritin 10 Mg Tabs (Loratadine) .Marland Kitchen.. 1 tablet by mouth once a day 7)  Nasonex 50 Mcg/act Susp (Mometasone furoate) .... 2 sprays per nostril daily. dispense one bottle. 8)  Felodipine 10 Mg Tb24 (Felodipine) .... Take 2 tablet by mouth once a day 9)  Lisinopril-hydrochlorothiazide 20-12.5 Mg Tabs (Lisinopril-hydrochlorothiazide) .... One by mouth daily 10)  Daily Multiple Vitamins/min Tabs (Multiple vitamins-minerals) .... Once daily 11)  Natural Mixed Tocopherols 400 Unit Caps (Vitamin e) .... 2 daily 12)  Zantac 150 Mg Tabs (Ranitidine hcl) .... One daily 13)  Ativan 1 Mg Tabs (Lorazepam) .Marland Kitchen.. 1-1.5 by mouth two times a day  as needed 14)  Metoclopramide Hcl 5 Mg Tabs (Metoclopramide hcl) .Marland Kitchen.. 1 tablet three times a day prior to meals 15)  Glucotrol Xl 10 Mg Xr24h-tab (Glipizide) .... One daily 16)  Novolog Flexpen 100 Unit/ml Soln (Insulin aspart) .Marland Kitchen.. 15 units prior to evening meal dispense one month supply 17)  Lantus Solostar 100 Unit/ml Soln (Insulin glargine) .... Inject 36 unit subcutaneously once a day in the morning.  dispense one month supply 18)  Glucophage Xr 500 Mg Tb24 (Metformin hcl) .... One three times a day 19)  Freestyle Test Strp (Glucose blood) .... Use as directed to test 3 times a day 20)  Freestyle Lancets Misc (Lancets) .... Use as directed 21)  Novofine 31 31g X 6 Mm Misc (Insulin pen needle) .... Use as directed 22)  Lunesta 2 Mg Tabs (Eszopiclone) .... One tab by mouth at bedtime  Patient Instructions: 1)  Please follow up next week for your toenail. 2)  Your next diabetes check is due the first week of July. 3)  Keep an eye on your fluid.  IF you still feel very full of fluid at next visit we will consider adding an as needed dose of demedex for some days.

## 2011-01-19 NOTE — Letter (Signed)
Summary: PHQ-9 Questionnaire  PHQ-9 Questionnaire   Imported By: Clydell Hakim 04/16/2010 14:07:34  _____________________________________________________________________  External Attachment:    Type:   Image     Comment:   External Document

## 2011-01-19 NOTE — Progress Notes (Signed)
Summary: refill  Phone Note Refill Request Call back at Home Phone [phone] Message from:  Patient  Refills Requested: Medication #1:  DEMADEX 20 MG TABS Take 1 tablet by mouth once a day Initial call taken by: De Nurse,  January 12, 2010 2:54 PM  Follow-up for Phone Call       Follow-up by: Golden Circle RN,  January 12, 2010 3:00 PM    Prescriptions: DEMADEX 20 MG TABS (TORSEMIDE) Take 1 tablet by mouth once a day  #31 Tablet x 2   Entered by:   Golden Circle RN   Authorized by:   Ancil Boozer  MD   Signed by:   Golden Circle RN on 01/12/2010   Method used:   [facility]        [facility] [address] [phone]* (retail)       [address]       [place], [place]  [address]       Ph: [phone] or [phone]       Fax: [phone]   RxID:   [id number]

## 2011-01-19 NOTE — Miscellaneous (Signed)
Summary: refill request for Lunesta  Clinical Lists Changes received refill request for Lunesta from pharmacy. will forward to MD. [facility] [address]  Theresia Lo RN  October 27, 2010 10:15 AM  Pt. seen by Dr. Gwendolyn Grant yesterday, holding [name] currently due to symptoms of dizzyness.  She was given a Rx for Meclizine for sleep.  Will not refill Lunesta at this time 2/2 dizzyness.

## 2011-01-19 NOTE — Progress Notes (Signed)
Summary: refill  Phone Note Refill Request Call back at Home Phone [phone] Message from:  Patient  needs accucheck test strips- needs today [facility] - [phone] or fax [phone]  Initial call taken by: De Nurse,  November 16, 2010 9:09 AM  Follow-up for Phone Call        patient states she checks blood sugar 3 times daily. not on med list since 2008 but she states she has been getting regularly. Brand is Accucheck. will ask Dr. Deirdre Priest to help with this. Follow-up by: Theresia Lo RN,  November 16, 2010 12:19 PM  Additional Follow-up for Phone Call Additional follow up Details #1::        Cant see reason to check three times a day blood sugars.  Would Rx for one box and patient needs to follow up and discuss with Dr Lula Olszewski  Additional Follow-up by: Pearlean Brownie MD,  November 16, 2010 1:32 PM    Additional Follow-up for Phone Call Additional follow up Details #2::    called pharmacy and sent in 1 box as directed which are 100 strips with directions to check  three times daily . patient notified to follow up with Dr. Venita Lick . she has appointment tomorrow . Follow-up by: Theresia Lo RN,  November 17, 2010 8:34 AM

## 2011-01-19 NOTE — Miscellaneous (Signed)
Summary: pa for protonix  Clinical Lists Changes prior auth for protonix to pcp.Golden Circle RN  March 11, 2010 2:15 PM   completed and placed in to be faxed pile. Ancil Boozer  MD  March 12, 2010 9:49 AM  Appended Document: pa for protonix approved for 1 yr

## 2011-01-19 NOTE — Progress Notes (Signed)
Summary: [facility]  Phone Note From Pharmacy   Caller: Brand[facility] [phone] Summary of Call: [facility] Ms. Mccuen was sold someone's medicine (lisinopril 40mg ) and the patient has been taking it along with her other two blood pressure medicaitons.  Pt has been feeling dizzy and unusual which is to be expected since she has been taking all these medications for BP.  Ms. Bazar was told to monitor her blood pressure. [facility] just wanted to make Dr. Lula Olszewski aware in case we needed to follow up with her. Initial call taken by: Terese Door,  October 22, 2010 3:52 PM  Follow-up for Phone Call        Have asked pt. to call [facility] if symptoms do not improve.  Follow-up by: Ardyth Gal MD,  October 22, 2010 5:14 PM

## 2011-01-19 NOTE — Progress Notes (Signed)
Summary: phn msg  Phone Note Call from Patient   Caller: Patient Summary of Call: pt wondering if Dr. Sandi Mealy could call her back about her appointment today. Initial call taken by: Clydell Hakim,  March 11, 2010 4:19 PM  Follow-up for Phone Call        calling to answer questions and also to let her know her labwork was good. states she plans to get an appt to get her toenail removed.   also states she called around about where she could get her mammogram without the compression due to her pain.  she states the breast center stated they could do an ultrasound examination.  i told her i had not heard of this instead of a mammogram but that i will be glad to call the breast center and see if this is truly the case and i will let her know what i find out.  Follow-up by: Ancil Boozer  MD,  March 12, 2010 11:33 AM  Additional Follow-up for Phone Call Additional follow up Details #1::        please let her know that I have confirmed that you must have a traditional mammogram first in order to need an ultrasound that an ultrasound of the breast is only used if they find something suspecious on the mammogram itself. Ultrasound cannot be done as a screening test for breast cancer like a mammogram can.  Additional Follow-up by: Ancil Boozer  MD,  March 13, 2010 1:37 PM    Additional Follow-up for Phone Call Additional follow up Details #2::    Attempted to call pt.  No answer and no machine Follow-up by: Jone Baseman CMA,  March 13, 2010 2:49 PM  Additional Follow-up for Phone Call Additional follow up Details #3:: Details for Additional Follow-up Action Taken: Pt returning call from she thinks is Dr. Sandi Mealy. Additional Follow-up by: Clydell Hakim,  March 13, 2010 3:43 PM  Pt informed of the above.  She sts that she "cant do a mammogram, so is she has cancer she will just havet to die with it"  Reiterated again that the breast center told MD that ultrasound cannot be done first.  Still declines  mammogram.  Will forward to MD for FYI. ............................................... Delora Fuel March 13, 2010 4:22 PM

## 2011-01-19 NOTE — Letter (Signed)
Summary: Generic Letter  [facility]  [address]   [place], [place] [address]   Phone: [phone]  Fax: [phone]    10/11/2010  Rebecca Walter [address] [place], [place]  [address]  Dear Ms. Yaklin,  I am writing to let you know the results of your blood tests.  Your hemoglobin A1C is 6.3%, indicating your diabetes is well controlled.  However, your cholesterol is slightly elevated.  I am not going to make any changes to your cholesterol right now.  I want you to concentrate on eating a low-fat, low-cholesterol diet, and exercising, as this is the best way to lower cholesterol.  If you have not already, please make an appointment to see me in 3 months to follow up on your diabetes and cholesterol.     Sincerely,   Ardyth Gal MD

## 2011-01-19 NOTE — Miscellaneous (Signed)
Summary: lorazepam refill  Clinical Lists Changes Completed fax request for refill Medications: Changed medication from ATIVAN 0.5 MG TABS (LORAZEPAM) 1-2 tablets two times a day as needed for anxiety - DO NOT FILL UNTIL 05/04/2009 to ATIVAN 0.5 MG TABS (LORAZEPAM) 1-2 tablets two times a day as needed for anxiety - Signed Rx of ATIVAN 0.5 MG TABS (LORAZEPAM) 1-2 tablets two times a day as needed for anxiety;  #60 x 1;  Signed;  Entered by: Doralee Albino MD;  Authorized by: Doralee Albino MD;  Method used: Handwritten    Prescriptions: ATIVAN 0.5 MG TABS (LORAZEPAM) 1-2 tablets two times a day as needed for anxiety  #60 x 1   Entered and Authorized by:   Doralee Albino MD   Signed by:   Doralee Albino MD on 02/02/2010   Method used:   Handwritten   RxID:   [id number]

## 2011-01-21 NOTE — Progress Notes (Signed)
  Phone Note Call from Patient   Caller: Patient Call For: [phone] Summary of Call: Pt calling regarding rx faxed from pharmacy for Metformin .  Please send in asap.  Pt is out of meds and need refills. Initial call taken by: Abundio Miu,  December 23, 2010 10:30 AM    Prescriptions: GLUCOPHAGE XR 500 MG  TB24 (METFORMIN HCL) one three times a day  #90 x 11   Entered and Authorized by:   Ardyth Gal MD   Signed by:   Ardyth Gal MD on 12/23/2010   Method used:   [facility]        [facility] [address] [phone][facility]ail[facility]       [address]       [place], [place]  [address]       Ph: [phone] or [phone]       Fax: [phone]   RxID:   [id number]

## 2011-01-21 NOTE — Letter (Signed)
Summary: Generic Letter  [facility]  [address]   [place], [place] [address]   Phone: [phone]  Fax: [phone]    01/14/2011  [address] [place], [place]  [address]  Dear Rebecca Walter,  We are happy to let you know that since you are covered under Medicare you are able to have a FREE visit at the [facility] to discuss your HEALTH. This is a new benefit for Medicare.  There will be no co-payment.  At this visit you will meet with Arlys John an expert in wellness and the health coach at our clinic.  At this visit we will discuss ways to keep you healthy and feeling well.  This visit will not replace your regular doctor visit and we cannot refill medications.     You will need to plan to be here at least one hour to talk about your medical history, your current status, review all of your medications, and discuss your future plans for your health.  This information will be entered into your record for your doctor to have and review.  If you are interested in staying healthy, this type of visit can help.  Please call the office at: [phone], to schedule a "Medicare Wellness Visit".  The day of the visit you should bring in all of your medications, including any vitamins, herbs, over the counter products you take.  Make a list of all the other doctors that you see, so we know who they are. If you have any other health documents please bring them.  We look forward to helping you stay healthy.  Sincerely,   [facility]  iAWV

## 2011-01-21 NOTE — Miscellaneous (Signed)
  Clinical Lists Changes  Problems: Removed problem of BENIGN POSITIONAL VERTIGO (ICD-386.11) Removed problem of DIZZINESS (ICD-780.4) Removed problem of LEG EDEMA, BILATERAL (ICD-782.3) Changed problem from CARCINOMA, BASAL CELL (ICD-173.9) to History of  CARCINOMA, BASAL CELL (ICD-173.9) Changed problem from GOUT, ACUTE (ICD-274.0) to History of  GOUT, ACUTE (ICD-274.0)

## 2011-01-22 ENCOUNTER — Telehealth: Payer: Self-pay | Admitting: Family Medicine

## 2011-01-27 NOTE — Progress Notes (Signed)
Summary: refill  Phone Note Refill Request Call back at Home Phone [phone] Message from:  Patient  Refills Requested: Medication #1:  LOVASTATIN 40 MG TABS 2 tablets daily   Notes: pt is out  Medication #2:  ALLOPURINOL 100 MG TABS Take 1 tablet by mouth once a day pt states that the pharm has sent[facility]ed since [facility]  Initial call taken by: De Nurse,  January 22, 2011 10:38 AM    Prescriptions: LOVASTATIN 40 MG TABS (LOVASTATIN) 2 tablets daily  #62 x 5   Entered by:   Golden Circle RN   Authorized by:   Ardyth Gal MD   Signed by:   Golden Circle RN on 01/22/2011   Method used:   [facility]        [facility] [address] [phone]* (retail)       [address]       [place], [place]  [address]       Ph: [phone] or [phone]       Fax: [phone]   RxID:   [id number] ALLOPURINOL 100 MG TABS (ALLOPURINOL) Take 1 tablet by mouth once a day  #31 x 5   Entered by:   Golden Circle RN   Authorized by:   Ardyth Gal MD   Signed by:   Golden Circle RN on 01/22/2011   Method used:   [facility]        [facility] [address] [phone]* (retail)       [address]       [place], [place]  [address]       Ph: [phone] or [phone]       Fax: [phone]   RxID:   [id number]

## 2011-02-15 ENCOUNTER — Other Ambulatory Visit: Payer: Self-pay | Admitting: *Deleted

## 2011-02-15 DIAGNOSIS — I10 Essential (primary) hypertension: Secondary | ICD-10-CM

## 2011-02-15 MED ORDER — TORSEMIDE 20 MG PO TABS: 20.0000 mg | ORAL_TABLET | Freq: Every day | ORAL | Status: DC

## 2011-02-15 NOTE — Telephone Encounter (Signed)
Received refill request for Demedex. Rx sent in for one month supply. Advised patient to schedule appointment before next refill needed.  At office visit in Nov 2011 MD had stated to return in 3 months.

## 2011-02-25 ENCOUNTER — Other Ambulatory Visit: Payer: Self-pay | Admitting: *Deleted

## 2011-02-25 DIAGNOSIS — I10 Essential (primary) hypertension: Secondary | ICD-10-CM

## 2011-02-25 MED ORDER — GLIPIZIDE ER 10 MG PO TB24: 10.0000 mg | ORAL_TABLET | Freq: Every day | ORAL | Status: DC

## 2011-02-25 MED ORDER — LISINOPRIL-HYDROCHLOROTHIAZIDE 20-12.5 MG PO TABS: 1.0000 | ORAL_TABLET | Freq: Every day | ORAL | Status: DC

## 2011-03-10 ENCOUNTER — Telehealth: Payer: Self-pay | Admitting: Family Medicine

## 2011-03-10 NOTE — Telephone Encounter (Signed)
Pt checking status of rx for lorazepam that was called in Sunday, [facility].

## 2011-03-10 NOTE — Telephone Encounter (Signed)
Will forward to MD.  

## 2011-03-11 MED ORDER — LORAZEPAM 1 MG PO TABS: 1.0000 mg | ORAL_TABLET | Freq: Two times a day (BID) | ORAL | Status: DC | PRN

## 2011-03-11 NOTE — Telephone Encounter (Signed)
rx called to pharmacy for lorazepam.

## 2011-03-11 NOTE — Telephone Encounter (Signed)
Dear Cliffton Asters Team Please refill this by phone as below [facility]! Denny Levy, MD

## 2011-03-16 ENCOUNTER — Other Ambulatory Visit: Payer: Self-pay | Admitting: Family Medicine

## 2011-03-16 DIAGNOSIS — I10 Essential (primary) hypertension: Secondary | ICD-10-CM

## 2011-03-22 ENCOUNTER — Other Ambulatory Visit: Payer: Self-pay | Admitting: Family Medicine

## 2011-03-22 ENCOUNTER — Encounter: Payer: Self-pay | Admitting: Family Medicine

## 2011-03-22 ENCOUNTER — Ambulatory Visit (INDEPENDENT_AMBULATORY_CARE_PROVIDER_SITE_OTHER): Payer: MEDICARE | Admitting: Family Medicine

## 2011-03-22 DIAGNOSIS — M109 Gout, unspecified: Secondary | ICD-10-CM

## 2011-03-22 DIAGNOSIS — E118 Type 2 diabetes mellitus with unspecified complications: Secondary | ICD-10-CM

## 2011-03-22 DIAGNOSIS — I10 Essential (primary) hypertension: Secondary | ICD-10-CM

## 2011-03-22 DIAGNOSIS — E119 Type 2 diabetes mellitus without complications: Secondary | ICD-10-CM

## 2011-03-22 DIAGNOSIS — K3184 Gastroparesis: Secondary | ICD-10-CM

## 2011-03-22 DIAGNOSIS — IMO0002 Reserved for concepts with insufficient information to code with codable children: Secondary | ICD-10-CM

## 2011-03-22 DIAGNOSIS — E1165 Type 2 diabetes mellitus with hyperglycemia: Secondary | ICD-10-CM

## 2011-03-22 DIAGNOSIS — J309 Allergic rhinitis, unspecified: Secondary | ICD-10-CM | POA: Insufficient documentation

## 2011-03-22 DIAGNOSIS — E785 Hyperlipidemia, unspecified: Secondary | ICD-10-CM

## 2011-03-22 MED ORDER — DEMADEX 20 MG PO TABS: 20.0000 mg | ORAL_TABLET | Freq: Every day | ORAL | Status: DC

## 2011-03-22 MED ORDER — GLIPIZIDE ER 10 MG PO TB24: 10.0000 mg | ORAL_TABLET | Freq: Every day | ORAL | Status: DC

## 2011-03-22 MED ORDER — METOCLOPRAMIDE HCL 5 MG PO TABS: 5.0000 mg | ORAL_TABLET | Freq: Three times a day (TID) | ORAL | Status: DC

## 2011-03-22 MED ORDER — METFORMIN HCL ER 500 MG PO TB24: 500.0000 mg | ORAL_TABLET | Freq: Three times a day (TID) | ORAL | Status: DC

## 2011-03-22 MED ORDER — LOVASTATIN 40 MG PO TABS: 80.0000 mg | ORAL_TABLET | Freq: Every day | ORAL | Status: DC

## 2011-03-22 MED ORDER — LISINOPRIL-HYDROCHLOROTHIAZIDE 20-12.5 MG PO TABS: 1.0000 | ORAL_TABLET | Freq: Every day | ORAL | Status: DC

## 2011-03-22 MED ORDER — MOMETASONE FUROATE 50 MCG/ACT NA SUSP: 2.0000 | Freq: Every day | NASAL | Status: DC

## 2011-03-22 MED ORDER — INSULIN GLARGINE 100 UNIT/ML ~~LOC~~ SOLN: 36.0000 [IU] | SUBCUTANEOUS | Status: DC

## 2011-03-22 MED ORDER — INSULIN ASPART 100 UNIT/ML ~~LOC~~ SOLN: 15.0000 [IU] | Freq: Three times a day (TID) | SUBCUTANEOUS | Status: DC

## 2011-03-22 MED ORDER — ASPIRIN 81 MG PO CHEW: 81.0000 mg | CHEWABLE_TABLET | Freq: Every day | ORAL | Status: AC

## 2011-03-22 MED ORDER — RANITIDINE HCL 150 MG PO TABS: 150.0000 mg | ORAL_TABLET | Freq: Every day | ORAL | Status: DC

## 2011-03-22 MED ORDER — ALLOPURINOL 100 MG PO TABS: 100.0000 mg | ORAL_TABLET | Freq: Every day | ORAL | Status: DC

## 2011-03-22 MED ORDER — FELODIPINE ER 10 MG PO TB24: 20.0000 mg | ORAL_TABLET | Freq: Every day | ORAL | Status: DC

## 2011-03-22 MED ORDER — OMEPRAZOLE 40 MG PO CPDR: 40.0000 mg | DELAYED_RELEASE_CAPSULE | Freq: Two times a day (BID) | ORAL | Status: DC

## 2011-03-22 MED ORDER — GLUCOSE BLOOD VI STRP: ORAL_STRIP | Status: DC

## 2011-03-22 MED ORDER — LORATADINE 10 MG PO TABS: 10.0000 mg | ORAL_TABLET | Freq: Every day | ORAL | Status: DC

## 2011-03-22 NOTE — Telephone Encounter (Signed)
rx has already been sent in today.

## 2011-03-22 NOTE — Patient Instructions (Signed)
It was good to see you.  Please call the breast center and have your mammogram done.  Ask them to send me the report.  Please schedule an appointment in about 6 months for follow up of your diabetes.  I will refill all your medications for six months. Contact the office with any questions or problems.

## 2011-03-23 ENCOUNTER — Encounter: Payer: Self-pay | Admitting: Family Medicine

## 2011-03-23 NOTE — Progress Notes (Signed)
Subjective:     Patient ID: Rebecca Walter, female   DOB: 08-Apr-1941, 70 y.o.   MRN: [id number]  HPI Pt presents for follow up of chronic medical conditions and refills.   DM- Pt states fasting blood sugars have been 80-120, post prandials as high as 180, with no hypoglycemic episodes.  She can explain to me how she takes her insulin properly, and is not having side effects from glipizide or metformin.  She still prefers to check her blood sugar four times a day as she is afraid of giving herself too much SSI and having a low blood sugar if she does not check.   Hypertension: Pt is now taking lisinopril/HCTZ, demedex, and felodipine without episodes of dizziness or other side effects.  She says she has not had the verito symptoms she was having this past winter again.  She says she has to have brand name Demedex, not torsemide.  She has not been checking her blood pressure at home because she knows her cuff is not very accurate and it just makes her worry.   GI- pt says her GERD, Esophageal dysmotility, and gastroparesis have not been bothering her lately.  She has been eating without pain or nausea/vomiting and says she needs her current medications to do this normally.   Allergies: Pt says she has had some nasal congestion, and needs a refill on Nasonex.  She is also taking Claritin, and she feels the combination makes spring allergy season bearable.  She denies any watery, itchy eyes, but endsorses some sneezing.   For her HLD and Gout pt has no complaints and just wants refills on her medicines.   Review of Systems  Constitutional: Negative for fever and appetite change.  HENT: Positive for congestion, rhinorrhea and sneezing.   Eyes: Negative for itching and visual disturbance.  Respiratory: Negative for shortness of breath.   Cardiovascular: Negative for chest pain.  Gastrointestinal: Negative for nausea, vomiting, abdominal pain, diarrhea and constipation.  Genitourinary: Negative for  difficulty urinating.  Musculoskeletal: Positive for arthralgias.  Neurological: Negative for dizziness, light-headedness and headaches.  Psychiatric/Behavioral: Negative for dysphoric mood and agitation.       Objective:   Physical Exam BP 140/76  Pulse 64  Temp(Src) 98.1 F (36.7 C) (Oral)  Wt 191 lb (86.637 kg) General appearance: alert, cooperative and no distress Eyes: conjunctivae/corneas clear. PERRL, EOM's intact. Fundi benign. Nose: mild congestion, no sinus tenderness Throat: lips, mucosa, and tongue normal; teeth and gums normal Neck: no adenopathy, supple, symmetrical, trachea midline and thyroid not enlarged, symmetric, no tenderness/mass/nodules Lungs: clear to auscultation bilaterally Heart: regular rate and rhythm and systolic murmur: systolic ejection 3/6,   Abdomen: soft, non-tender; bowel sounds normal; no masses,  no organomegaly Extremities: extremities normal, atraumatic, no cyanosis or edema Pulses: 2+ and symmetric    Assessment:         Plan:

## 2011-03-23 NOTE — Assessment & Plan Note (Signed)
Well controlled, continue Reglan, omeprazole, and rantidine.

## 2011-03-23 NOTE — Assessment & Plan Note (Signed)
No acute gouty episodes, continue allopurinol.

## 2011-03-23 NOTE — Assessment & Plan Note (Signed)
Pt's symptoms are improved with treatment, continue nasonex and claratin.

## 2011-03-23 NOTE — Assessment & Plan Note (Addendum)
Lipid panel in October shows good control. Continue lovastatin.

## 2011-03-23 NOTE — Assessment & Plan Note (Signed)
Pt with moderate control.  As she is elderly and has had multiple problems with side effects to blood pressure medications, will continue current regimen and continue BP monitoring.

## 2011-03-23 NOTE — Assessment & Plan Note (Signed)
A1c is slightly elevated (6.3 last visit, now 6.8).  Pt voices understanding about how to adjust SSI.  Will not make changes but have asked her to adjust her to be diligent about SSI.  Follow up in 6 months as pt has been stable with A1c less than 7 for more than two years.

## 2011-04-27 ENCOUNTER — Other Ambulatory Visit: Payer: Self-pay | Admitting: Family Medicine

## 2011-04-27 DIAGNOSIS — I10 Essential (primary) hypertension: Secondary | ICD-10-CM

## 2011-04-28 ENCOUNTER — Telehealth: Payer: Self-pay | Admitting: Family Medicine

## 2011-04-28 DIAGNOSIS — F419 Anxiety disorder, unspecified: Secondary | ICD-10-CM

## 2011-04-28 MED ORDER — LORAZEPAM 1 MG PO TABS: 1.0000 mg | ORAL_TABLET | Freq: Two times a day (BID) | ORAL | Status: DC | PRN

## 2011-04-28 NOTE — Telephone Encounter (Signed)
Pt is needing refill on her Lorazepam.  Sent in a request via phone to pharmacy on Sunday evening.  Pharmacy haven't received.

## 2011-05-07 NOTE — Procedures (Signed)
[place]  Patient:    Rebecca Walter, Rebecca Walter Visit Number: [id number] MRN: [id number]          Service Type: END Location: ENDO Attending Physician:  Charna Elizabeth Dictated by:   Anselmo Rod, M.D. Proc. Date: 01/03/02 Admit Date:  01/03/2002   CC:         Onalee Hua L. Priebe   Procedure Report  DATE OF BIRTH:  03-01-1941  REFERRING PHYSICIAN:  Kinnie Scales. Reed Breech, M.D.  PROCEDURE PERFORMED:  Colonoscopy.  ENDOSCOPIST:  Anselmo Rod, M.D.  INSTRUMENT USED:  Olympus video colonoscope.  INDICATIONS FOR PROCEDURE:  Rectal bleeding in a 70 year old white female with a history of diabetes.  Rule out colonic polyps, masses, hemorrhoids, etc.  PREPROCEDURE PREPARATION:  Informed consent was procured from the patient. The patient was fasted for eight hours prior to the procedure and prepped with a bottle of magnesium citrate and a gallon of NuLytely the night prior to the procedure.  PREPROCEDURE PHYSICAL:  The patient had stable vital signs.  Neck supple. Chest clear to auscultation.  S1, S2 regular.  Abdomen soft with normal abdominal bowel sounds.  DESCRIPTION OF PROCEDURE:  The patient was placed in the left lateral decubitus position and sedated with 100 mg of Demerol and 10 mg of Versed intravenously.  Once the patient was adequately sedated and maintained on low-flow oxygen and continuous cardiac monitoring, the Olympus video colonoscope was advanced from the rectum to the cecum with slight difficulty secondary to some residual stool in the colon.  Multiple washes were done. The appendicular orifice and the ileocecal valve were clearly visualized and photographed. No masses, polyps, erosions or ulcerations were seen.  There were a few early left-sided diverticula.  Small external hemorrhoids were seen on anal inspection.  The patient tolerated the procedure well without complication.  IMPRESSION: 1. Normal-appearing colon except  for a few early left-sided diverticula. 2. Small external hemorrhoids. 3. No masses or polyps seen.  RECOMMENDATIONS: 1. A high fiber diet has been recommended for the patient. 2. Repeat colorectal cancer screening is recommended in the next five    years unless the patient were to develop any abnormal symptoms in the    interim. 3. Outpatient follow-up on a p.r.n. basis.Dictated by:   Anselmo Rod, M.D.  Attending Physician:  Charna Elizabeth DD:  01/03/02 TD:  01/03/02 Job: [id number]

## 2011-05-18 ENCOUNTER — Telehealth: Payer: Self-pay | Admitting: Family Medicine

## 2011-05-18 DIAGNOSIS — K3184 Gastroparesis: Secondary | ICD-10-CM

## 2011-05-18 MED ORDER — OMEPRAZOLE 40 MG PO CPDR: 40.0000 mg | DELAYED_RELEASE_CAPSULE | Freq: Two times a day (BID) | ORAL | Status: DC

## 2011-05-18 NOTE — Telephone Encounter (Signed)
Rx for 60 sent to pharmacy.

## 2011-05-18 NOTE — Telephone Encounter (Signed)
Pt says omeprazole was called in incorrectly, MD only called in 30 tablets, pt takes this 2 x daily, needs 30 more sent to pharmacy, [facility]

## 2011-06-14 ENCOUNTER — Telehealth: Payer: Self-pay | Admitting: Family Medicine

## 2011-06-14 DIAGNOSIS — F419 Anxiety disorder, unspecified: Secondary | ICD-10-CM

## 2011-06-14 MED ORDER — LORAZEPAM 1 MG PO TABS: 1.0000 mg | ORAL_TABLET | Freq: Two times a day (BID) | ORAL | Status: DC | PRN

## 2011-06-14 NOTE — Telephone Encounter (Signed)
Notified that Lorazepam had been called in.

## 2011-06-14 NOTE — Telephone Encounter (Signed)
Needs refill for [facility] called [facility].  Dr. Lula Olszewski is out until Tuesday afternoon and Ms. Vanhorn is hoping that someone else can call it in for her.  She would like someone to call her when completed.

## 2011-07-02 MED ORDER — INSULIN GLARGINE 100 UNIT/ML ~~LOC~~ SOLN: 36.0000 [IU] | SUBCUTANEOUS | Status: DC

## 2011-07-15 ENCOUNTER — Other Ambulatory Visit: Payer: Self-pay | Admitting: Family Medicine

## 2011-07-15 DIAGNOSIS — F419 Anxiety disorder, unspecified: Secondary | ICD-10-CM

## 2011-07-15 MED ORDER — LORAZEPAM 1 MG PO TABS: 1.0000 mg | ORAL_TABLET | Freq: Two times a day (BID) | ORAL | Status: DC | PRN

## 2011-07-15 NOTE — Telephone Encounter (Signed)
Refilled via fax from pharmacy, but Larita Fife will call to have her make an appointment. She should be appraised of the fall risk of taking this medication frequently.

## 2011-08-11 ENCOUNTER — Telehealth: Payer: Self-pay | Admitting: *Deleted

## 2011-08-11 DIAGNOSIS — F419 Anxiety disorder, unspecified: Secondary | ICD-10-CM

## 2011-08-11 NOTE — Telephone Encounter (Signed)
Patient calls requesting refill on lorazepam. She was told she would  need to come in before refill by Dr. Sheffield Slider recently but she has scheduled appointment for 08/18/2011 to follow up about diabetes and meds and wants to ask if MD will give her enough to last until that appointment. Will forward message to MD.

## 2011-08-12 MED ORDER — LORAZEPAM 1 MG PO TABS: 1.0000 mg | ORAL_TABLET | Freq: Two times a day (BID) | ORAL | Status: DC | PRN

## 2011-08-12 NOTE — Telephone Encounter (Signed)
rx called to pharmacy and patient notified.

## 2011-08-12 NOTE — Telephone Encounter (Signed)
Ok to call in #15 tablets.  Please notify patient, and that this is the last time we will be calling in Lorazepam, from now on needs to be prescriptions given at regularly scheduled appointments.

## 2011-08-18 ENCOUNTER — Encounter: Payer: Self-pay | Admitting: Family Medicine

## 2011-08-18 ENCOUNTER — Ambulatory Visit (INDEPENDENT_AMBULATORY_CARE_PROVIDER_SITE_OTHER): Payer: Medicare Other | Admitting: Family Medicine

## 2011-08-18 VITALS — BP 136/70 | Temp 98.0°F | Ht 58.5 in | Wt 192.0 lb

## 2011-08-18 DIAGNOSIS — C4490 Unspecified malignant neoplasm of skin, unspecified: Secondary | ICD-10-CM

## 2011-08-18 DIAGNOSIS — F419 Anxiety disorder, unspecified: Secondary | ICD-10-CM

## 2011-08-18 DIAGNOSIS — G4700 Insomnia, unspecified: Secondary | ICD-10-CM

## 2011-08-18 DIAGNOSIS — E1165 Type 2 diabetes mellitus with hyperglycemia: Secondary | ICD-10-CM

## 2011-08-18 DIAGNOSIS — F411 Generalized anxiety disorder: Secondary | ICD-10-CM

## 2011-08-18 DIAGNOSIS — IMO0002 Reserved for concepts with insufficient information to code with codable children: Secondary | ICD-10-CM

## 2011-08-18 LAB — POCT GLYCOSYLATED HEMOGLOBIN (HGB A1C): Hemoglobin A1C: 6.7

## 2011-08-18 MED ORDER — LORAZEPAM 1 MG PO TABS: 1.0000 mg | ORAL_TABLET | Freq: Every evening | ORAL | Status: DC | PRN

## 2011-08-18 NOTE — Progress Notes (Signed)
  Subjective:    Patient ID: Rebecca Walter, female    DOB: 11/01/1941, 70 y.o.   MRN: [id number]  HPI  Patient presents for follow up.  She has decided to increase her lantus from 36 to 40 units in the morning, and this has helped with her blood sugar control.  She complains that the metformin upsets her stomach.  She takes it three times a day.  She takes novolog one to three times a day, but she says usually just with dinner.  He has not seen the eye doctor in a long time, knows she needs to go.  Patient complains of an ingrown toe nail of the right great toe.  She says it was removed a few years ago, but it grew back and is causing her some pain now.  Also, she complains that the nail is too thick for her to cut with clippers.  She would like the nail removed again.   Patient continues to have some difficulty sleeping.  She says [name]  does not help but lorazepam does.  She takes 1-2 lorazepam at bedtime.  She says when she tried to stop it before she went through withdrawal and felt terrible.  Patient says that she watches TV in the living room, then gets ready for bed, and then watches fox news from 10-11 pm in bed.  She often has difficulty falling asleep, as well as staying asleep.   Patient also reports she has a mole she is worried about and is going to see the dermatologist. She says the mole has grown and changed over the past few years.  It is on the back of her calf so she has to use a mirror to look at it.   Review of Systems Negative except HPI.     Objective:   Physical Exam BP 136/70  Temp(Src) 98 F (36.7 C) (Oral)  Ht 4' 10.5" (1.486 m)  Wt 192 lb (87.091 kg)  BMI 39.45 kg/m2 General appearance: alert, cooperative and no distress Eyes: conjunctivae/corneas clear. PERRL, EOM's intact. Fundi benign. Neck: no adenopathy, no carotid bruit, no JVD, supple, symmetrical, trachea midline and thyroid not enlarged, symmetric, no tenderness/mass/nodules Lungs: clear to  auscultation bilaterally Heart: regular rate and rhythm and systolic murmur: holosystolic 3/6, blowing  unchanged from previous.  Abdomen: soft, non-tender; bowel sounds normal; no masses,  no organomegaly Extremities: no edema, redness or tenderness in the calves or thighs and Right great toe thick, yellow, ingrown.  No sign of bacterial infection. Skin: mobility and turgor normal or nevi - lower leg(s) right, concerning for basal cell carcinoma.        Assessment & Plan:  ANXIETY Fairly well controlled.  Discussed lorazepam as dangerous in elderly.  Will decrease # to only once at bedtime.    CARCINOMA, BASAL CELL Concern for nevous being basal cell.  Pt to see dermatologist, she has had basal cells removed in past.   DIABETES MELLITUS, II, COMPLICATIONS Well controlled.  Patient would like to stop metformin at dinner time.  Advised this was OK, but to monitor sugars closely, adjust as needed.  Patient understands and is able to do this.   INSOMNIA Poor sleep hygiene.  Discussed no TV in bedroom, having calming bedtime routine, possibly reading before bed.  Cont lorazepam, but rx for only one at bedtime.

## 2011-08-18 NOTE — Assessment & Plan Note (Signed)
Fairly well controlled.  Discussed lorazepam as dangerous in elderly.  Will decrease # to only once at bedtime.

## 2011-08-18 NOTE — Assessment & Plan Note (Signed)
Well controlled.  Patient would like to stop metformin at dinner time.  Advised this was OK, but to monitor sugars closely, adjust as needed.  Patient understands and is able to do this.

## 2011-08-18 NOTE — Assessment & Plan Note (Signed)
Concern for nevous being basal cell.  Pt to see dermatologist, she has had basal cells removed in past.

## 2011-08-18 NOTE — Assessment & Plan Note (Signed)
Poor sleep hygiene.  Discussed no TV in bedroom, having calming bedtime routine, possibly reading before bed.  Cont lorazepam, but rx for only one at bedtime.

## 2011-08-18 NOTE — Patient Instructions (Signed)
It was good to see you today.  Your Hemoglobin A1C is  Lab Results  Component Value Date   HGBA1C 6.7 08/18/2011  .  Remember your goal for A1C is less than 7.  Your goal for fasting morning blood sugar is 80-120.   Great job!  Please make an appointment to have your toe nail removed.  There is room in my schedule on Friday afternoon.

## 2011-08-20 ENCOUNTER — Ambulatory Visit (INDEPENDENT_AMBULATORY_CARE_PROVIDER_SITE_OTHER): Payer: Medicare Other | Admitting: Family Medicine

## 2011-08-20 ENCOUNTER — Encounter: Payer: Self-pay | Admitting: Family Medicine

## 2011-08-20 ENCOUNTER — Ambulatory Visit: Payer: Medicare Other | Admitting: Family Medicine

## 2011-08-20 VITALS — BP 132/76 | HR 88 | Temp 98.0°F | Wt 191.0 lb

## 2011-08-20 DIAGNOSIS — L600 Ingrowing nail: Secondary | ICD-10-CM | POA: Insufficient documentation

## 2011-08-20 NOTE — Progress Notes (Signed)
  Toenail Avulsion Procedure Note  Pre-operative Diagnosis: Right Ingrown Great toenail   Post-operative Diagnosis: Right Ingrown Great toenail  Indications: Pain, pt unable to trip toe nail due to thickness  Anesthesia: Lidocaine 1% without epinephrine with added sodium bicarbonate  Procedure Details  History of allergy to iodine: no  The risks (including bleeding and infection) and benefits of the  procedure and Written informed consent obtained.  After digital block anesthesia was obtained, a tourniquet was applied for hemostasis during the procedure.  After prepping with Betadine, the bilateral offending edges of the nail was freed from the nailbed and perionychium, and then split with scissors and removed with  forceps.  All visible granulation tissue is debrided. Antibiotic and bulky dressing was applied.   Findings: Ingrown right great toe nail, on bilateral sides of nail.   Complications: pain.  Plan: 1. Soak the foot twice daily. Change dressing twice daily until healed over. 2. Warning signs of infection were reviewed.   3. Recommended that the patient use OTC acetaminophen and OTC ibuprofen as needed for pain.  4. Return in 2 weeks for check on toe.

## 2011-08-20 NOTE — Patient Instructions (Signed)
Toenail Removal Toenails may need to be removed because of injury, infections or to correct abnormal growth. A special non-stick bandage will likely be put tightly on your toe to prevent bleeding. Often times a new nail will grow back. Sometimes the new nail may be deformed. Most of the time when a nail is lost, it will gradually heal, but may be sensitive for a long time. HOME CARE INSTRUCTIONS  Keep your foot elevated to relieve pain and swelling. This will require lying in bed or on a couch with the leg on pillows or sitting in a recliner with the leg up. Walking or letting your leg dangle may increase swelling, slow healing and cause throbbing pain.   Keep your bandage dry and clean.   Change your bandage in 24 hours.   After your bandage is changed, soak your foot in warm, soapy water for 10 to 20 minutes. Do this 3 times per day. This helps reduce pain and swelling. After soaking your foot, apply a clean, dry bandage. Change your bandage if it is wet or dirty.   Only take over-the-counter or prescription medicines for pain, discomfort, or fever as directed by your caregiver.   See your caregiver as needed for problems.  SEEK IMMEDIATE MEDICAL CARE IF:  You have increased pain, swelling, redness & warmth (inflammation), drainage or bleeding.   An oral temperature above 101 develops, not controlled by medication.   You have swelling that spreads from your toe into your foot.

## 2011-08-24 ENCOUNTER — Other Ambulatory Visit: Payer: Self-pay | Admitting: Family Medicine

## 2011-08-24 MED ORDER — COLCHICINE 0.6 MG PO TABS: ORAL_TABLET | ORAL | Status: DC

## 2011-08-24 NOTE — Telephone Encounter (Signed)
Rebecca Walter is calling back again and would like to speak with Dr. Lula Olszewski today.

## 2011-08-24 NOTE — Telephone Encounter (Signed)
Called patient back, she is sure this is gout, but says her foot is swollen, red, hurts very badly.  She says there is a place that hurts very badly where the digital block was done for the toe nail removal.  I told her I was concerned about an infection vs. Gout, and felt she needed to be seen by a doctor.  She agrees to call for an appointment in next day or two.  Will call in Colchicine, emphasized patient needs to have her foot looked at by a doctor soon.

## 2011-08-24 NOTE — Telephone Encounter (Signed)
Was in last week for a toenail removal and is not having a flair up of Gout in the bunion on that same foot.  She has been prescribed Colcicine in the past but it was last 2007 and she doesn't know if she can take what she has.  She thinks she needs a refill.  Please give her a call.

## 2011-08-25 ENCOUNTER — Other Ambulatory Visit: Payer: Self-pay | Admitting: Family Medicine

## 2011-08-25 ENCOUNTER — Telehealth: Payer: Self-pay | Admitting: Family Medicine

## 2011-08-25 MED ORDER — INSULIN ASPART 100 UNIT/ML ~~LOC~~ SOLN: 15.0000 [IU] | Freq: Three times a day (TID) | SUBCUTANEOUS | Status: DC

## 2011-08-25 NOTE — Telephone Encounter (Signed)
Waiting for rx for novolog.  Please send to pharmacy

## 2011-08-26 ENCOUNTER — Encounter: Payer: Self-pay | Admitting: Family Medicine

## 2011-08-26 ENCOUNTER — Ambulatory Visit (INDEPENDENT_AMBULATORY_CARE_PROVIDER_SITE_OTHER): Payer: Medicare Other | Admitting: Family Medicine

## 2011-08-26 VITALS — BP 140/76 | HR 81 | Temp 98.1°F | Ht 58.5 in | Wt 195.1 lb

## 2011-08-26 DIAGNOSIS — L980 Pyogenic granuloma: Secondary | ICD-10-CM | POA: Insufficient documentation

## 2011-08-26 DIAGNOSIS — M109 Gout, unspecified: Secondary | ICD-10-CM

## 2011-08-26 DIAGNOSIS — L600 Ingrowing nail: Secondary | ICD-10-CM

## 2011-08-26 MED ORDER — COLCHICINE 0.6 MG PO TABS: ORAL_TABLET | ORAL | Status: DC

## 2011-08-26 NOTE — Patient Instructions (Signed)
I refilled  your colchicine- continue to take once a day until improved or you have diarrhea, stomach upset Follow-up with Dr. Lula Olszewski in 2 weeks to recheck your toe and gout.

## 2011-08-26 NOTE — Progress Notes (Signed)
  Subjective:    Patient ID: Rebecca Walter, female    DOB: 18-May-1941, 70 y.o.   MRN: [id number]  HPI Here for follow-up of right great toe lateral and medial nail removal last week.  Has been cleaning with peroxide and antibiotic ointment.  No signs of infection or bleeding, some weeping of medial edge.    Notes sharp pain at site of nerve block on top of base of great toe.  Very painful to the touch, but able to walk, no change in ROM.   Also has had a gout flare in that same great toe- Did not resolved with two doses of colchicine as it usually does, has been taking NSAIDs which has controlled it some.  Review of Systems see HPI     Objective:   Physical Exam GEN: Alert & Oriented, No acute distress, well groomed Ext: right great toe s/p lateral and medial nail edge removal.  Healing well without signs of infection.  No residual nail spicule.  Medial edge with granulation tissue over nail bed.  Small bruise with tenderness over dorsal aspect of base of right  great toe at area of nerve block.  Same toe with mild erythema, tenderness and warmth at first MTP  Right great toe: medial nail bed  procedure: cautery: Method: silver nitrate Indication: pyogenic granuloma Procedure; cauterized with several passes of silver nitrate stick .  Patient tolerated procedure.       Assessment & Plan:

## 2011-08-26 NOTE — Assessment & Plan Note (Signed)
First flare in many years, likely due to toenail removal triggering it.  Advised continued NSAID use, refilled colchicine so she may take for longer course and will have extras so she may take 2 tablets at the onset of a future flare as delayed tx impairs chances of quick resolution.  Will follow-up with PCp in 2 weeks, may consider rechecking uric acid at that time to make sure allopurinol at optimal dose.

## 2011-08-26 NOTE — Assessment & Plan Note (Signed)
Cauterized medial nail bed with silver nitrate

## 2011-08-26 NOTE — Assessment & Plan Note (Signed)
No infection, I used silver nitrate stick to treat the medial nail bed which appeared to be early pyogenic granuloma, in order to prevent problems with regrowth of nail over increased tissue.  Advised to discontinue hydrogen peroxide, wash the soap and water and may continue use of abx ointment.  Advised watchful waiting of pain at site on nerve block-possible nerve irritation

## 2011-08-27 ENCOUNTER — Telehealth: Payer: Self-pay | Admitting: Family Medicine

## 2011-08-27 NOTE — Telephone Encounter (Signed)
Want to ask somethings regarding about her toe.  Confused about and have questions

## 2011-08-27 NOTE — Telephone Encounter (Signed)
She is concerned about how to properly care for her toe.  Reviewed care.  Answered all questions.

## 2011-09-06 ENCOUNTER — Encounter: Payer: Self-pay | Admitting: Family Medicine

## 2011-09-06 ENCOUNTER — Ambulatory Visit (INDEPENDENT_AMBULATORY_CARE_PROVIDER_SITE_OTHER): Payer: Medicare Other | Admitting: Family Medicine

## 2011-09-06 DIAGNOSIS — R071 Chest pain on breathing: Secondary | ICD-10-CM

## 2011-09-06 DIAGNOSIS — M109 Gout, unspecified: Secondary | ICD-10-CM

## 2011-09-06 DIAGNOSIS — R0789 Other chest pain: Secondary | ICD-10-CM

## 2011-09-06 DIAGNOSIS — L600 Ingrowing nail: Secondary | ICD-10-CM

## 2011-09-06 NOTE — Assessment & Plan Note (Signed)
Most likely intercostal muscle strain based on exam and history.  Advised pt to watch for shingles rash, to call office if she notices one.  Advised continuing ibuprofen, heating pad, and avoid movements that cause pain.

## 2011-09-06 NOTE — Assessment & Plan Note (Signed)
Patient with decreased pain, no sign of infection today on exam.  No further dressings necessary.

## 2011-09-06 NOTE — Assessment & Plan Note (Signed)
Acute attack resolved.  Pt has finished colchicine.  Will continue regular Allopurinol for prevention.

## 2011-09-06 NOTE — Progress Notes (Signed)
  Subjective:    Patient ID: Rebecca Walter, female    DOB: 1941-12-15, 70 y.o.   MRN: [id number]  HPI  Patient presents for follow up of toe nail removal and gout attack after toe nail removal.  She reports she took Colchicine and the gout pain has gone down.  She also says that her toe pain has improved over all, no erythema or drainage.  She is concerned she will not be able to cut her toe nail herself once it grows out, and is considering getting a podiatrist.    Pt also complains of pain on the right side of her ribs, it hurts to take a deep breath and pain with palpation.  She denies any pain with exertion, dyspnea, palpitations, cough, congestion, fever/chills.  She denies any injury or trauma to the area, but says she is afraid to try to lift things.    Review of Systems Negative except HPI.     Objective:   Physical Exam BP 152/77  Pulse 87  Temp(Src) 97.8 F (36.6 C) (Oral)  Wt 193 lb 3.2 oz (87.635 kg) General appearance: alert, cooperative and no distress Nose: Nares normal. Septum midline. Mucosa normal. No drainage or sinus tenderness. Throat: lips, mucosa, and tongue normal; teeth and gums normal Lungs: clear to auscultation bilaterally Heart: regular rate and rhythm, S1, S2 normal, no murmur, click, rub or gallop Chest Wall: + tenderness to palpation in between 9th and 10th ribs.  No step offs or crepitus.  No rash or lesions present on skin Right Toe: s/p later laminectomy, nail growing out straight, with some granulation over area removed.  No erythema or drainage, minimal pain with palpation, no pain with movement.        Assessment & Plan:  GOUT, ACUTE Acute attack resolved.  Pt has finished colchicine.  Will continue regular Allopurinol for prevention.    Ingrown right greater toenail Patient with decreased pain, no sign of infection today on exam.  No further dressings necessary.   Right-sided chest wall pain Most likely intercostal muscle strain based on  exam and history.  Advised pt to watch for shingles rash, to call office if she notices one.  Advised continuing ibuprofen, heating pad, and avoid movements that cause pain.

## 2011-09-20 ENCOUNTER — Other Ambulatory Visit: Payer: Self-pay | Admitting: Family Medicine

## 2011-09-20 NOTE — Telephone Encounter (Signed)
Refill request

## 2011-09-29 ENCOUNTER — Encounter: Payer: Self-pay | Admitting: Family Medicine

## 2011-09-29 ENCOUNTER — Ambulatory Visit (INDEPENDENT_AMBULATORY_CARE_PROVIDER_SITE_OTHER): Payer: Medicare Other | Admitting: Family Medicine

## 2011-09-29 DIAGNOSIS — M109 Gout, unspecified: Secondary | ICD-10-CM

## 2011-09-29 MED ORDER — COLCHICINE 0.6 MG PO TABS: ORAL_TABLET | ORAL | Status: DC

## 2011-09-29 NOTE — Assessment & Plan Note (Signed)
Does not appear to be in full acute flare today, but has not gotten good relief from symptoms with several smaller flares in interim. I will check   her uric acid and creatinine today. We discussed that if her uric acid is elevated at we will likely increase her allopurinol. I have refilled her colchicine so that she may use it at the onset of a gout flare. We also reviewed the concomitant use of NSAIDs and I gave her an educational handout on gout so she can continue to work on lifestyle triggers.  I advised her to to continue to followup with her PCP. They may reexamine her need for diuretic therapy.

## 2011-09-29 NOTE — Patient Instructions (Signed)
Gout Gout is an inflammatory condition (arthritis) caused by a buildup of uric acid crystals in the joints. Uric acid is a chemical that is normally present in the blood. Under some circumstances, uric acid can form into crystals in your joints. This causes joint redness, soreness, and swelling (inflammation). Repeat attacks are common. Over time, uric acid crystals can form into masses (tophi) near a joint, causing disfigurement. Gout is treatable and often preventable. CAUSES The disease begins with elevated levels of uric acid in the blood. Uric acid is produced by your body when it breaks down a naturally found substance called purines. This also happens when you eat certain foods such as meats and fish. Causes of an elevated uric acid level include:  Being passed down from parent to child (heredity).   Diseases that cause increased uric acid production (obesity, psoriasis, some cancers).   Excessive alcohol use.   Diet, especially diets rich in meat and seafood.   Medicines, including certain cancer-fighting drugs (chemotherapy), diuretics, and aspirin.   Chronic kidney disease. The kidneys are no longer able to remove uric acid well.   Problems with metabolism.  Conditions strongly associated with gout include:  Obesity.   High blood pressure.   High cholesterol.   Diabetes.  Not everyone with elevated uric acid levels gets gout. It is not understood why some people get gout and others do not. Surgery, joint injury, and eating too much of certain foods are some of the factors that can lead to gout. SYMPTOMS  An attack of gout comes on quickly. It causes intense pain with redness, swelling, and warmth in a joint.   Fever can occur.   Often, only one joint is involved. Certain joints are more commonly involved:   Base of the big toe.   Knee.   Ankle.   Wrist.   Finger.  Without treatment, an attack usually goes away in a few days to weeks. Between attacks, you usually  will not have symptoms, which is different from many other forms of arthritis. DIAGNOSIS Your caregiver will suspect gout based on your symptoms and exam. Removal of fluid from the joint (arthrocentesis) is done to check for uric acid crystals. Your caregiver will give you a medicine that numbs the area (local anesthetic) and use a needle to remove joint fluid for exam. Gout is confirmed when uric acid crystals are seen in joint fluid, using a special microscope. Sometimes, blood, urine, and X-ray tests are also used. TREATMENT There are 2 phases to gout treatment: treating the sudden onset (acute) attack and preventing attacks (prophylaxis). Treatment of an Acute Attack  Medicines are used. These include anti-inflammatory medicines or steroid medicines.   An injection of steroid medicine into the affected joint is sometimes necessary.   The painful joint is rested. Movement can worsen the arthritis.   You may use warm or cold treatments on painful joints, depending which works best for you.   Discuss the use of coffee, vitamin C, or cherries with your caregiver. These may be helpful treatment options.  Treatment to Prevent Attacks After the acute attack subsides, your caregiver may advise prophylactic medicine. These medicines either help your kidneys eliminate uric acid from your body or decrease your uric acid production. You may need to stay on these medicines for a very long time. The early phase of treatment with prophylactic medicine can be associated with an increase in acute gout attacks. For this reason, during the first few months of treatment, your caregiver  may also advise you to take medicines usually used for acute gout treatment. Be sure you understand your caregiver's directions. You should also discuss dietary treatment with your caregiver. Certain foods such as meats and fish can increase uric acid levels. Other foods such as dairy can decrease levels. Your caregiver can give  you a list of foods to avoid. HOME CARE INSTRUCTIONS  Do not take aspirin to relieve pain. This raises uric acid levels.   Only take over-the-counter or prescription medicines for pain, discomfort, or fever as directed by your caregiver.   Rest the joint as much as possible. When in bed, keep sheets and blankets off painful areas.   Keep the affected joint raised (elevated).   Use crutches if the painful joint is in your leg.   Drink enough water and fluids to keep your urine clear or pale yellow. This helps your body get rid of uric acid. Do not drink alcoholic beverages. They slow the passage of uric acid.   Follow your caregiver's dietary instructions. Pay careful attention to the amount of protein you eat. Your daily diet should emphasize fruits, vegetables, whole grains, and fat-free or low-fat milk products.   Maintain a healthy body weight.  SEEK MEDICAL CARE IF:  You have an oral temperature above 101.   You develop diarrhea, vomiting, or any side effects from medicines.   You do not feel better in 24 hours, or you are getting worse.  SEEK IMMEDIATE MEDICAL CARE IF:  Your joint becomes suddenly more tender and you have:   Chills.   An oral temperature above 101 not controlled by medicine.  MAKE SURE YOU:  Understand these instructions.   Will watch your condition.   Will get help right away if you are not doing well or get worse.  Document Released: 12/03/2000 Document Re-Released: 05/26/2010 [facility] Patient Information 2011 [place], [place].

## 2011-09-29 NOTE — Progress Notes (Signed)
  Subjective:    Patient ID: Rebecca Walter, female    DOB: May 01, 1941, 70 y.o.   MRN: [id number]  HPI patient presents as a work in appointment for gout flare. I saw patient approximately one month ago and at that time she had started a gout flare which seemed to be triggered by a recent toenail removal. She was taking allopurinol 100 mg daily and at that time I placed her on colchicine and NSAIDs for acute treatment.  She reported good resolution with this treatment and had been off colchicine until several days ago when she noticed worsening of right great toe pain. Patient states she does not currently have any swelling at the first MTP and is only minimally painful. The pain she is concerned about was a sharp pain she had in this toe which woke her up from sleep. At the present moment this toe does not hurt.  She does report large meal of meat loaf. No recent alcohol use     Review of SystemsGEN: no fever, chills See hpi        Objective:   Physical Exam Right great toe: Right great toe shows both sides of the lateral nailbed status post nail removal which appeared to be healing well. No cellulitis or evidence of infection. Patient is minimally tender at the first MTP with no swelling or erythema.       Assessment & Plan:

## 2011-09-30 ENCOUNTER — Telehealth: Payer: Self-pay | Admitting: Family Medicine

## 2011-09-30 LAB — COMPREHENSIVE METABOLIC PANEL
ALT: 18 U/L (ref 0–35)
AST: 16 U/L (ref 0–37)
Alkaline Phosphatase: 53 U/L (ref 39–117)
Sodium: 138 mEq/L (ref 135–145)
Total Bilirubin: 0.3 mg/dL (ref 0.3–1.2)
Total Protein: 7.5 g/dL (ref 6.0–8.3)

## 2011-09-30 LAB — URIC ACID: Uric Acid, Serum: 7.2 mg/dL — ABNORMAL HIGH (ref 2.4–7.0)

## 2011-09-30 MED ORDER — ALLOPURINOL 300 MG PO TABS: 300.0000 mg | ORAL_TABLET | Freq: Every day | ORAL | Status: DC

## 2011-09-30 NOTE — Assessment & Plan Note (Addendum)
Not in acute flare, will increase allopurinol from 100 to 300.  She will continue to take nsaid + colchicine during initiation of new dose.

## 2011-09-30 NOTE — Telephone Encounter (Signed)
See problem lis gout.

## 2011-10-19 ENCOUNTER — Other Ambulatory Visit: Payer: Self-pay | Admitting: Family Medicine

## 2011-10-19 NOTE — Telephone Encounter (Signed)
refill request

## 2011-11-05 ENCOUNTER — Ambulatory Visit (INDEPENDENT_AMBULATORY_CARE_PROVIDER_SITE_OTHER): Payer: Medicare Other | Admitting: *Deleted

## 2011-11-05 DIAGNOSIS — Z23 Encounter for immunization: Secondary | ICD-10-CM

## 2011-11-10 ENCOUNTER — Other Ambulatory Visit: Payer: Self-pay | Admitting: Family Medicine

## 2011-11-10 MED ORDER — INSULIN PEN NEEDLE 31G X 6 MM MISC: Status: DC

## 2011-12-03 ENCOUNTER — Telehealth: Payer: Self-pay | Admitting: Family Medicine

## 2011-12-03 ENCOUNTER — Ambulatory Visit
Admission: RE | Admit: 2011-12-03 | Discharge: 2011-12-03 | Disposition: A | Discharge status: Home / Self Care | Payer: Medicare Other | Source: Ambulatory Visit | Attending: Family Medicine | Admitting: Family Medicine

## 2011-12-03 ENCOUNTER — Ambulatory Visit (INDEPENDENT_AMBULATORY_CARE_PROVIDER_SITE_OTHER): Payer: Medicare Other | Admitting: Family Medicine

## 2011-12-03 VITALS — Temp 98.3°F

## 2011-12-03 DIAGNOSIS — IMO0002 Reserved for concepts with insufficient information to code with codable children: Secondary | ICD-10-CM

## 2011-12-03 DIAGNOSIS — E1165 Type 2 diabetes mellitus with hyperglycemia: Secondary | ICD-10-CM

## 2011-12-03 DIAGNOSIS — R109 Unspecified abdominal pain: Secondary | ICD-10-CM

## 2011-12-03 DIAGNOSIS — I10 Essential (primary) hypertension: Secondary | ICD-10-CM

## 2011-12-03 DIAGNOSIS — E118 Type 2 diabetes mellitus with unspecified complications: Secondary | ICD-10-CM

## 2011-12-03 DIAGNOSIS — R319 Hematuria, unspecified: Secondary | ICD-10-CM

## 2011-12-03 LAB — POCT GLYCOSYLATED HEMOGLOBIN (HGB A1C): Hemoglobin A1C: 6.8

## 2011-12-03 LAB — POCT URINALYSIS DIPSTICK
Bilirubin, UA: NEGATIVE
Glucose, UA: NEGATIVE
Nitrite, UA: NEGATIVE
Spec Grav, UA: <=1.005
Urobilinogen, UA: 0.2

## 2011-12-03 MED ORDER — AMLODIPINE BESYLATE 5 MG PO TABS: 5.0000 mg | ORAL_TABLET | Freq: Every day | ORAL | Status: DC

## 2011-12-03 NOTE — Patient Instructions (Signed)
It was good to see you today  I am starting you on norvasc for your blood pressure This should also help your kidney tubes relax.  I am getting an xray of your belly to see if there are any kidney stones present  Drink plenty of water daily,  Come back to see me in 2 weeks to let me know if your symptoms dont improve Call with any questions,  God Bless and Merry Christmas Doree Albee MD   Kidney Stones Kidney stones (ureteral lithiasis) are deposits that form inside your kidneys. The intense pain is caused by the stone moving through the urinary tract. When the stone moves, the ureter goes into spasm around the stone. The stone is usually passed in the urine.  CAUSES   A disorder that makes certain neck glands produce too much parathyroid hormone (primary hyperparathyroidism).   A buildup of uric acid crystals.   Narrowing (stricture) of the ureter.   A kidney obstruction present at birth (congenital obstruction).   Previous surgery on the kidney or ureters.   Numerous kidney infections.  SYMPTOMS   Feeling sick to your stomach (nauseous).   Throwing up (vomiting).   Blood in the urine (hematuria).   Pain that usually spreads (radiates) to the groin.   Frequency or urgency of urination.  DIAGNOSIS   Taking a history and physical exam.   Blood or urine tests.   Computerized X-ray scan (CT scan).   Occasionally, an examination of the inside of the urinary bladder (cystoscopy) is performed.  TREATMENT   Observation.   Increasing your fluid intake.   Surgery may be needed if you have severe pain or persistent obstruction.  The size, location, and chemical composition are all important variables that will determine the proper choice of action for you. Talk to your caregiver to better understand your situation so that you will minimize the risk of injury to yourself and your kidney.  HOME CARE INSTRUCTIONS   Drink enough water and fluids to keep your urine clear or  pale yellow.   Strain all urine through the provided strainer. Keep all particulate matter and stones for your caregiver to see. The stone causing the pain may be as small as a grain of salt. It is very important to use the strainer each and every time you pass your urine. The collection of your stone will allow your caregiver to analyze it and verify that a stone has actually passed.   Only take over-the-counter or prescription medicines for pain, discomfort, or fever as directed by your caregiver.   Make a follow-up appointment with your caregiver as directed.   Get follow-up X-rays if required. The absence of pain does not always mean that the stone has passed. It may have only stopped moving. If the urine remains completely obstructed, it can cause loss of kidney function or even complete destruction of the kidney. It is your responsibility to make sure X-rays and follow-ups are completed. Ultrasounds of the kidney can show blockages and the status of the kidney. Ultrasounds are not associated with any radiation and can be performed easily in a matter of minutes.  SEEK IMMEDIATE MEDICAL CARE IF:   Pain cannot be controlled with the prescribed medicine.   You have a fever.   The severity or intensity of pain increases over 18 hours and is not relieved by pain medicine.   You develop a new onset of abdominal pain.   You feel faint or pass out.  MAKE  SURE YOU:   Understand these instructions.   Will watch your condition.   Will get help right away if you are not doing well or get worse.  Document Released: 12/06/2005 Document Revised: 08/18/2011 Document Reviewed: 04/03/2010 [facility] Patient Information 2012 [place], [place].

## 2011-12-03 NOTE — Telephone Encounter (Signed)
She has just started noting that urine has a reddish color , golden red she states. Denies dysuria but maybe just a twinge when she urinates. Currently taking an inflammatory med . Appointment scheduled at 1:30 today.

## 2011-12-03 NOTE — Telephone Encounter (Signed)
Please call patient back asap.  Concerned about color of urine.

## 2011-12-03 NOTE — Telephone Encounter (Signed)
Patient calling because urine is yellow/reddish and she is wondering if it is sugar in her urine. Urine is sometimes clear and sometimes colored.  No dysuria, no abdominal.  Is taking ibuprofen, and she noticed it starting after she started it so she decreased her dose.  Is a diabetic, most recent CBG 215, mild nausea, no vomiting, no polyuria/polydypsia, fevers/chills, CP, SOB, myalgias. Patient wants to know what is in her urine making it turn a color.  Told pt that I cannot tell her this over the phone, but that she can call clinic for an appt and we can evaluate her urine at that time.  Told pt if she is overly concerned and cannot wait for clinic to open, she can always go to the ED to have her urine evaluated.  Patient's biggest concern is that this means she will need dialysis.  Reassured pt and encouraged her to make an appt at the clinic for evaluation in the morning.

## 2011-12-04 ENCOUNTER — Encounter: Payer: Self-pay | Admitting: Family Medicine

## 2011-12-04 DIAGNOSIS — R319 Hematuria, unspecified: Secondary | ICD-10-CM | POA: Insufficient documentation

## 2011-12-04 LAB — CBC
HCT: 40.3 % (ref 36.0–46.0)
Hemoglobin: 13.2 g/dL (ref 12.0–15.0)
WBC: 8.2 10*3/uL (ref 4.0–10.5)

## 2011-12-04 LAB — BASIC METABOLIC PANEL
CO2: 30 mEq/L (ref 19–32)
Calcium: 9.8 mg/dL (ref 8.4–10.5)
Potassium: 4.4 mEq/L (ref 3.5–5.3)
Sodium: 141 mEq/L (ref 135–145)

## 2011-12-04 NOTE — Assessment & Plan Note (Signed)
Low dose norvasc started today.

## 2011-12-04 NOTE — Progress Notes (Signed)
  Subjective:    Patient ID: Rebecca Walter, female    DOB: Mar 25, 1941, 70 y.o.   MRN: [id number]  HPI Pt is here for evaluation of subjective hematuria. Pt states that she has noticed intermittent red tinged urine over the last month as well as intermittent increased urinary frequency. Pt states that she has also noted intermittent flank pain R>L ass'd with these episodes. Pt states that she has a baseline history of kidney stones that were surgically treated in the 70s. Pt states that she has had renal stones intermittently since this point. Pt denies any weight loss. No hx/o GU maligancy. Pt is non smoker. Pt denies any suprpubic pain or dysuria. CBGs have been stable. Pt states that current blood tinged urine is similar presentation to previous episodes of kidney stones. Pt states that she has increased her water intake over the last week which has resolved sxs per pt.    Review of Systems See HPI, otherwise 12 point ROS negative.     Objective:   Physical Exam Gen: up in chair, NAD, morbidly obese  CV: RRR, no murmurs auscultated PULM: CTAB, no wheezes, rales, rhoncii ABD: obese abdomen, + bowel sounds, no suprapubic tenderness, mild R sided flank pain.   EXT: 2+ peripheral pulses   Assessment & Plan:

## 2011-12-04 NOTE — Assessment & Plan Note (Addendum)
wOverall history leads to a likely dx of kidney stones as cause. UA negative for blood which is somewhat reassuring. But also on the differential includes ? GU malignancy given age(bladder ca). Will obtain KUB to eval for kidney stones, though results may be unlcear because of pt's body habitus. Baseline labs including CBC and BMET also drawn. Encouraged pt to drink > 8 glasses of water per day. Will start pt on low dose norvasc in setting of elevated BPs as this should also help with ureteral relaxation. If sxs recur despite this tx regimen over the next 2-4 weeks, will consider a more aggressive workup including CT Abd and Pelvis as to better assess anatomy. These options were discussed at length with the pt. Pt is agreeable to conservative treatment for the next 2 weeks. Low threshold for more aggressive work up. Case reviewed with Dr. Leveda Anna.

## 2011-12-06 ENCOUNTER — Telehealth: Payer: Self-pay | Admitting: Family Medicine

## 2011-12-06 NOTE — Telephone Encounter (Signed)
Rebecca Walter is calling back about her results.  She had blood in her urine this morning and is concerned.

## 2011-12-06 NOTE — Telephone Encounter (Signed)
Pt had xray on Friday and would like to know results - she is still having blood in stool

## 2011-12-07 ENCOUNTER — Other Ambulatory Visit: Payer: Self-pay | Admitting: Family Medicine

## 2011-12-07 NOTE — Telephone Encounter (Signed)
Called to follow up with pt about hematuria.  Pt states that she has had recurrence of blood tinged urine over the weekend. Pt states that urine seems to be reddish tinged.  Pt also reported persistence of R flank pain. Pt has been compliant with oral fluids and low dose norvasc. Discussed with pt options of CT scan vs. Urology referral. Pt feels more comfortable with urology referral. Instructed pt that we would set pt up for urology referral. Pt agreeable to this.

## 2011-12-09 ENCOUNTER — Telehealth: Payer: Self-pay | Admitting: Family Medicine

## 2011-12-09 NOTE — Telephone Encounter (Signed)
Called patient and told her that I have faxed a referral to [facility] and they should be contacting her with an appointment. Told her she can call back tomorrow after lunch if she has not heard anything and I will all [facility] to check on the appointment.Harsha Yusko, Rodena Medin

## 2011-12-09 NOTE — Telephone Encounter (Signed)
Pt is asking about referral to urology

## 2011-12-21 ENCOUNTER — Other Ambulatory Visit: Payer: Self-pay | Admitting: Family Medicine

## 2011-12-22 NOTE — Telephone Encounter (Signed)
refill request

## 2011-12-22 NOTE — Telephone Encounter (Signed)
Refill request

## 2011-12-23 ENCOUNTER — Other Ambulatory Visit: Payer: Self-pay | Admitting: Family Medicine

## 2011-12-23 MED ORDER — METFORMIN HCL ER 500 MG PO TB24: 500.0000 mg | ORAL_TABLET | Freq: Three times a day (TID) | ORAL | Status: DC

## 2011-12-24 ENCOUNTER — Other Ambulatory Visit: Payer: Self-pay | Admitting: Family Medicine

## 2011-12-24 NOTE — Telephone Encounter (Signed)
Refill request

## 2011-12-30 ENCOUNTER — Other Ambulatory Visit: Payer: Self-pay | Admitting: Family Medicine

## 2011-12-30 DIAGNOSIS — E118 Type 2 diabetes mellitus with unspecified complications: Secondary | ICD-10-CM

## 2011-12-30 DIAGNOSIS — IMO0002 Reserved for concepts with insufficient information to code with codable children: Secondary | ICD-10-CM

## 2011-12-30 MED ORDER — GLUCOSE BLOOD VI STRP: ORAL_STRIP | Status: DC

## 2011-12-30 NOTE — Telephone Encounter (Signed)
Ms. Chriswell is calling because her Pharmacy requested a refill on her test strips, but it is not in Dr. Ian Bushman box.  It should be coming from [facility] - [phone].

## 2011-12-30 NOTE — Telephone Encounter (Signed)
Paper request now in box.  I have sent in the test strips to [facility] electronically.

## 2012-01-13 ENCOUNTER — Other Ambulatory Visit: Payer: Self-pay | Admitting: Family Medicine

## 2012-01-13 NOTE — Telephone Encounter (Signed)
Refill request

## 2012-01-19 ENCOUNTER — Ambulatory Visit (INDEPENDENT_AMBULATORY_CARE_PROVIDER_SITE_OTHER): Payer: Medicare Other | Admitting: Family Medicine

## 2012-01-19 ENCOUNTER — Encounter: Payer: Self-pay | Admitting: Family Medicine

## 2012-01-19 DIAGNOSIS — R319 Hematuria, unspecified: Secondary | ICD-10-CM

## 2012-01-19 DIAGNOSIS — I10 Essential (primary) hypertension: Secondary | ICD-10-CM

## 2012-01-19 DIAGNOSIS — E1165 Type 2 diabetes mellitus with hyperglycemia: Secondary | ICD-10-CM

## 2012-01-19 DIAGNOSIS — IMO0002 Reserved for concepts with insufficient information to code with codable children: Secondary | ICD-10-CM

## 2012-01-19 DIAGNOSIS — E785 Hyperlipidemia, unspecified: Secondary | ICD-10-CM

## 2012-01-19 MED ORDER — INSULIN GLARGINE 100 UNIT/ML ~~LOC~~ SOLN: 50.0000 [IU] | Freq: Every day | SUBCUTANEOUS | Status: DC

## 2012-01-19 NOTE — Assessment & Plan Note (Signed)
Being worked up by Urology- pt still with occasional hematuria but no longer having back pain.

## 2012-01-19 NOTE — Patient Instructions (Signed)
It was good to see you.  Your A1c in December was 6.8- great job.  Your blood pressure today was 140/80.  Please keep track of your blood pressures at home.  If they are frequently above 140/80, please call and make an appointment- you likely need to be on a higher dose of blood pressure medications.   Please get your mammogram done and see the eye doctor.  I will send you a letter with your lab results.

## 2012-01-19 NOTE — Assessment & Plan Note (Signed)
Well controlled, will refill Lantus, continue current medications.

## 2012-01-19 NOTE — Assessment & Plan Note (Signed)
Check fasting lipids- will adjust statin pending labs.

## 2012-01-19 NOTE — Progress Notes (Signed)
  Subjective:    Patient ID: Rebecca Walter, female    DOB: 05-07-1941, 71 y.o.   MRN: [id number]  HPI  Rebecca Walter comes in for follow up.    Hematuria- she has been to see Urology, who is working up her hematuria.  They are concerned about a kidney stone, and are getting a CT scan next week.  She says she still sees blood in her urine from time to time, but has not had as much back pain since she was in our office in December.   DM- blood sugars well controlled, ranging 80-120 in the am.  She is now taking 50 units of lantus in the morning, and counts carbs and does a sliding scale three times a day with meals.  She is also taking her metformin and glipizide daily.   HTN- at home, her blood pressures range 120's/70's-140/80.  She denies headaches, vision changes, chest pain, palpitations, dyspnea. Taking medications.   HLD- over due for lipid profile, taking lovastatin without difficulty.   Health Maintenance- has not gotten mammogram or seen eye doctor in more than a year.    Review of Systems Pertinent items in HPI    Objective:   Physical Exam BP 140/80  Pulse 90  Temp(Src) 97.7 F (36.5 C) (Oral)  Ht 4' 10.5" (1.486 m)  Wt 193 lb (87.544 kg)  BMI 39.65 kg/m2 General appearance: alert, cooperative and no distress Eyes: conjunctivae/corneas clear. PERRL, EOM's intact. Fundi benign. Neck: no adenopathy, no JVD, supple, symmetrical, trachea midline and thyroid not enlarged, symmetric, no tenderness/mass/nodules Back: symmetric, no curvature. ROM normal. No CVA tenderness. Lungs: clear to auscultation bilaterally Heart: regular rate and rhythm, S1, S2 normal, no murmur, click, rub or gallop Abdomen: soft, non-tender; bowel sounds normal; no masses,  no organomegaly Extremities: extremities normal, atraumatic, no cyanosis or edema Pulses: 2+ and symmetric       Assessment & Plan:

## 2012-01-19 NOTE — Assessment & Plan Note (Addendum)
Slightly elevated today in office, but pt very sensitive to medication changes and reports good control at home.  Discussed checking it at home and goals for control, pt agrees to call office for appointment if it is consistently elevated.  Continue current regimen.

## 2012-01-26 ENCOUNTER — Telehealth: Payer: Self-pay | Admitting: Family Medicine

## 2012-01-26 NOTE — Telephone Encounter (Signed)
Sugars are elevating and she want to know if she can double up on the Glipizide.

## 2012-01-27 NOTE — Telephone Encounter (Signed)
Pt informed. Rebecca Walter  

## 2012-01-27 NOTE — Telephone Encounter (Signed)
Yes- She can take 2 Glipizide (this should mean taking 20 mg po daily).  Please let her know this is the max dose of glipizide, and if her sugars are not well controlled with this change she needs to come in for an office visit.

## 2012-03-20 ENCOUNTER — Other Ambulatory Visit: Payer: Self-pay | Admitting: Family Medicine

## 2012-03-21 ENCOUNTER — Other Ambulatory Visit: Payer: Self-pay | Admitting: Family Medicine

## 2012-04-07 ENCOUNTER — Other Ambulatory Visit: Payer: Self-pay | Admitting: Family Medicine

## 2012-04-07 DIAGNOSIS — G4700 Insomnia, unspecified: Secondary | ICD-10-CM

## 2012-04-07 DIAGNOSIS — F419 Anxiety disorder, unspecified: Secondary | ICD-10-CM

## 2012-04-07 MED ORDER — LORAZEPAM 1 MG PO TABS: 1.0000 mg | ORAL_TABLET | Freq: Every evening | ORAL | Status: DC | PRN

## 2012-04-19 ENCOUNTER — Other Ambulatory Visit: Payer: Self-pay | Admitting: Family Medicine

## 2012-04-20 ENCOUNTER — Other Ambulatory Visit: Payer: Self-pay | Admitting: Family Medicine

## 2012-04-21 ENCOUNTER — Telehealth: Payer: Self-pay | Admitting: Family Medicine

## 2012-04-21 DIAGNOSIS — F419 Anxiety disorder, unspecified: Secondary | ICD-10-CM

## 2012-04-21 DIAGNOSIS — G4700 Insomnia, unspecified: Secondary | ICD-10-CM

## 2012-04-25 MED ORDER — LORAZEPAM 1 MG PO TABS: 1.0000 mg | ORAL_TABLET | Freq: Every evening | ORAL | Status: DC | PRN

## 2012-04-25 NOTE — Telephone Encounter (Signed)
Per chart review, lorazepam was authorized on 04/07/12 and directions state to fill 60 days after date on Rx.  Will clarify with Dr. Lula Olszewski and call patient back.   Gaylene Brooks, RN

## 2012-04-25 NOTE — Telephone Encounter (Signed)
Wants to know why her Rebecca Walter was denied.

## 2012-04-25 NOTE — Telephone Encounter (Signed)
Dr. Madolyn Frieze was covering my box during that time and it looks like she accidentally filled it for 60 days from day on script.  I will print one month's supply and have it faxed over to her pharmacy.   Per policy, patient must come in for office visit every three months for controlled substances.   Mariam Helbert 04/25/2012 9:54 AM

## 2012-04-25 NOTE — Telephone Encounter (Signed)
Returned call to patient.  Rx refilled per Dr. Lula Olszewski and faxed to [facility] on [address]/[phone].  Follow-up appt to discuss meds and CT results scheduled with Dr. Lula Olszewski for 05/03/12 at 1:45pm.  Gaylene Brooks, RN

## 2012-04-27 ENCOUNTER — Other Ambulatory Visit: Payer: Self-pay | Admitting: Family Medicine

## 2012-04-27 DIAGNOSIS — K3184 Gastroparesis: Secondary | ICD-10-CM

## 2012-04-27 MED ORDER — METFORMIN HCL ER 500 MG PO TB24: 500.0000 mg | ORAL_TABLET | Freq: Three times a day (TID) | ORAL | Status: DC

## 2012-04-27 MED ORDER — ALLOPURINOL 300 MG PO TABS: 300.0000 mg | ORAL_TABLET | Freq: Every day | ORAL | Status: DC

## 2012-04-27 MED ORDER — LISINOPRIL-HYDROCHLOROTHIAZIDE 20-12.5 MG PO TABS: 1.0000 | ORAL_TABLET | Freq: Every day | ORAL | Status: DC

## 2012-04-27 MED ORDER — FELODIPINE ER 10 MG PO TB24: 10.0000 mg | ORAL_TABLET | Freq: Every day | ORAL | Status: DC

## 2012-04-27 MED ORDER — OMEPRAZOLE 40 MG PO CPDR: 40.0000 mg | DELAYED_RELEASE_CAPSULE | Freq: Every day | ORAL | Status: DC

## 2012-04-27 MED ORDER — DEMADEX 20 MG PO TABS: 20.0000 mg | ORAL_TABLET | Freq: Every day | ORAL | Status: DC

## 2012-04-27 MED ORDER — GLIPIZIDE ER 10 MG PO TB24: 10.0000 mg | ORAL_TABLET | Freq: Every day | ORAL | Status: DC

## 2012-05-03 ENCOUNTER — Ambulatory Visit: Payer: Medicare Other | Admitting: Family Medicine

## 2012-05-08 ENCOUNTER — Encounter: Payer: Self-pay | Admitting: Family Medicine

## 2012-05-08 ENCOUNTER — Ambulatory Visit (INDEPENDENT_AMBULATORY_CARE_PROVIDER_SITE_OTHER): Payer: Medicare Other | Admitting: Family Medicine

## 2012-05-08 VITALS — BP 146/79 | HR 91 | Temp 98.5°F | Ht 58.5 in | Wt 193.1 lb

## 2012-05-08 DIAGNOSIS — F419 Anxiety disorder, unspecified: Secondary | ICD-10-CM

## 2012-05-08 DIAGNOSIS — F411 Generalized anxiety disorder: Secondary | ICD-10-CM

## 2012-05-08 DIAGNOSIS — G4700 Insomnia, unspecified: Secondary | ICD-10-CM

## 2012-05-08 DIAGNOSIS — IMO0002 Reserved for concepts with insufficient information to code with codable children: Secondary | ICD-10-CM

## 2012-05-08 DIAGNOSIS — E1165 Type 2 diabetes mellitus with hyperglycemia: Secondary | ICD-10-CM

## 2012-05-08 DIAGNOSIS — K3184 Gastroparesis: Secondary | ICD-10-CM

## 2012-05-08 DIAGNOSIS — R319 Hematuria, unspecified: Secondary | ICD-10-CM

## 2012-05-08 DIAGNOSIS — K219 Gastro-esophageal reflux disease without esophagitis: Secondary | ICD-10-CM

## 2012-05-08 MED ORDER — LORAZEPAM 1 MG PO TABS: 1.0000 mg | ORAL_TABLET | Freq: Every evening | ORAL | Status: DC | PRN

## 2012-05-08 MED ORDER — PANTOPRAZOLE SODIUM 40 MG PO TBEC: 40.0000 mg | DELAYED_RELEASE_TABLET | Freq: Every day | ORAL | Status: DC

## 2012-05-08 MED ORDER — ONDANSETRON HCL 4 MG PO TABS: 4.0000 mg | ORAL_TABLET | Freq: Three times a day (TID) | ORAL | Status: AC | PRN

## 2012-05-08 NOTE — Patient Instructions (Signed)
I am sorry you are not feeling well.  I want you to stop taking metformin since you think it upsets your stomach.  I have also sent a prescription for Protonix and zofran (nausea medication) to help with your symptoms.   For your blood sugars, you may have to increase your Lantus since you are stopping metformin.  When you check your blood sugar first thing in the morning before you have eaten, if it is above 150, I want you to increase your dose by 2 units.  Do this each day until your blood sugar is below 150.  If you reach 60 units, please call the office to let me know, we will have to split your Lantus to twice a day.    Please come back in about 3 months so I can see how you are doing, or sooner if you are not feeling any better.

## 2012-05-08 NOTE — Assessment & Plan Note (Signed)
Well controlled, A1C at goal.  Patient with significant GI symptoms, already taking Lantus and novolog.  Will D/C meformin, and gave instructions to taper up Lantus, see pt instructions.

## 2012-05-08 NOTE — Assessment & Plan Note (Signed)
Symptoms worse, patient reports poor quality of life because of this feeling.  Will continue Reglan and add RX for zofran for symptoms, and will make changes to GERD and DM medications to see if we can achieve better symptom control.

## 2012-05-08 NOTE — Progress Notes (Signed)
  Subjective:    Patient ID: Rebecca Walter, female    DOB: Jan 11, 1941, 71 y.o.   MRN: [id number]  HPI  [name] presents to clinic for follow up.    Hematuria- this has been improving, and a CT scan was done by urology, which showed some chronic changed, but no acute stone.  She denies back pain.  Gastroparesis- patient has a long history of this, but she says over the last 3-4 weeks she has been feeling very badly.  She has a lot of stomach discomfort, nausea, and lower GI pain.  She says she saw her gastroenterologist, who said she was on a max dose of reglan. She denies actually vomiting, but says she feels nauseated and does not want to eat.  She says the metformin has always upset her stomach some, but she noticed when she had to stop it for her CT scan her stomach felt much better.   GERD- She also has GERD, and says that the omeprazole is not controlling her symptoms.  Along with the nausea she has some burning pain and bad taste in her mouth.   DM- patient is taking metformin, Lantus 50, and sliding scale novolog.  No hyper or hypoglycemia.    Past Medical History  Diagnosis Date  . Diabetes mellitus   . Hypertension   . Anxiety   . Gout   . Gastroparesis due to DM   . Hyperlipidemia   . Osteopenia   . Arthritis   . GERD (gastroesophageal reflux disease)    Family History  Problem Relation Age of Onset  . Stroke Mother   . Heart disease Father   . Stroke Father   . Hypertension Father   . Cancer Sister   . Cancer Brother   . Heart disease Brother   . Diabetes Maternal Uncle    History  Substance Use Topics  . Smoking status: Never Smoker   . Smokeless tobacco: Never Used  . Alcohol Use: No    Review of Systems Pertinent items in HPI.     Objective:   Physical Exam BP 146/79  Pulse 91  Temp(Src) 98.5 F (36.9 C) (Oral)  Ht 4' 10.5" (1.486 m)  Wt 193 lb 1.6 oz (87.59 kg)  BMI 39.67 kg/m2 General appearance: alert, cooperative and no distress Eyes:  PERRL, EOMIT Lungs: clear to auscultation bilaterally Heart: regular rate and rhythm, S1, S2 normal, no murmur, click, rub or gallop Abdomen: mild epigastric tenderness, +BS, no rebound, guarding, or masses. Extremities: extremities normal, atraumatic, no cyanosis or edema Pulses: 2+ and symmetric       Assessment & Plan:

## 2012-05-08 NOTE — Assessment & Plan Note (Signed)
Poorly controlled with omeprazole.  Will d/c and start protonix, continue zantac and tums as needed.

## 2012-05-08 NOTE — Assessment & Plan Note (Signed)
No evidence of stone on CT scan, but she may have passed it.  CT did not show other etiology for hematuria (Kidey or bladder mass).  Will continue monitoring.

## 2012-05-10 ENCOUNTER — Telehealth: Payer: Self-pay | Admitting: Family Medicine

## 2012-05-10 NOTE — Telephone Encounter (Signed)
Called Rebecca Walter back.  We decided during her visit on Monday that she would stopp her metformin due to side effects (upset stomach), and we would slowly titrate up her Lantus.  She says that yesterday morning her fasting sugar was 146, so she did not take any extra lantus.  Then in the evening it was 207, so she took 16 units with dinner, later she checked it again and her blood sugar was 185, so she took 14 units.  This morning it was 165, which is a high number for her in the morning.  She is also not used to seeing numbers above 200 in the evening, so she was worried.    I had told her to increase lantus 2 units/day every day her fasting sugar is above 150.  Because she is worried about the elevated sugars, will advise increasing 2 units/ day every day fasting sugar is 120.  Patient voices understanding.  She will keep adjusting her Lantus, and call back again on Friday if she is not doing better.   Rebecca Walter 05/10/2012 10:39 AM

## 2012-05-10 NOTE — Telephone Encounter (Signed)
Patient need to speak with you regarding the change in her diabetic med and how she is taking it.  Blood sugars have been elevated over 200.  Not feeling very well.

## 2012-05-15 ENCOUNTER — Telehealth: Payer: Self-pay | Admitting: Family Medicine

## 2012-05-15 NOTE — Telephone Encounter (Signed)
Patient was seen last Monday for routine check up and was started on Insulin.  Instructions to Increase Insulin 2 units per day, currently up to 58 units per day.  She was told to call office when she is up to 60 units per day.  CBG [id number].  I told patient it was okay to take 60 units today and continue Novolog with meals.  Advised patient to call if CBG over 250.  Patient to call [facility] tomorrow with CBG readings.  She may need to schedule follow up appointment with PCP if CBG continues to be elevated.  Patient agreed with plan.

## 2012-05-16 ENCOUNTER — Encounter: Payer: Self-pay | Admitting: Pharmacist

## 2012-05-16 ENCOUNTER — Telehealth: Payer: Self-pay | Admitting: Family Medicine

## 2012-05-16 ENCOUNTER — Ambulatory Visit (INDEPENDENT_AMBULATORY_CARE_PROVIDER_SITE_OTHER): Payer: Medicare Other | Admitting: Pharmacist

## 2012-05-16 VITALS — Ht 60.0 in | Wt 190.0 lb

## 2012-05-16 DIAGNOSIS — E118 Type 2 diabetes mellitus with unspecified complications: Secondary | ICD-10-CM

## 2012-05-16 DIAGNOSIS — E1165 Type 2 diabetes mellitus with hyperglycemia: Secondary | ICD-10-CM

## 2012-05-16 DIAGNOSIS — IMO0002 Reserved for concepts with insufficient information to code with codable children: Secondary | ICD-10-CM

## 2012-05-16 NOTE — Telephone Encounter (Signed)
Conculted with Dr. Sheffield Slider and he advises that patient needs to take Novolog prior to each meal  and should eat an evening meal and take then instead of waiting to bedtime to take.     Explained all this to patient but she is very reluctant and not comfortable  with making these changes. She  has eaten two meals daily for quite a while. Is not hungry at evening time .  Afraid if she takes Novolog at breakfast, sugars will drop.  States the 15 units on med list was a number she gave when ask how much Novolog  insulin she takes.    Patient needs some detailed teaching . Appointment scheduled this afternoon at 2:15 with Dr. Raymondo Band. She is agreeable .

## 2012-05-16 NOTE — Telephone Encounter (Signed)
Patient is calling to let Dr. Lula Olszewski know that her blood sugars are running high and she would like to speak to Dr. Lula Olszewski.

## 2012-05-16 NOTE — Patient Instructions (Addendum)
Good to see you this afternoon! - Restart Metformin 500 mg XR twice daily (can titrate back to three times a day if no side effects occur) - Go back to taking Lantus 50 units daily - Continue Novolog slide scale with meals - Return in 2 weeks 6/11

## 2012-05-16 NOTE — Telephone Encounter (Signed)
Spoke with patient . She is very concerned since she is no longer taking metformin and regarding BS readings.    She is currently up to 60 units of Lantus daily .    States she eats 2 meals daily, breakfast at 10:00 and dinner  between  2:30 and  3:00 PM . She does not take novolog at breakfast . Takes 20-22 units Novolog at dinner depending on how BS is running .  At nighttime she takes 9-11 units Novolog based on BS reading. States she does not have a sliding scale , adjusts on her own.  She does eat a yogart and some crackers at this time.  BS readings fasting past 3 days 137-150 BS at 2:30 before mid day meal 191-218 BS at night  10:00 PM 145-200   She feels BS reading are due to stopping the metformin and wonders is there is anything else she can take to replace that.   Her current med list does of Novolog states 15 units prior to meals.  Patient currently is on Glipizide. .    Since Dr. Lula Olszewski is on vacation this week  Will ask preceptor for advice.

## 2012-05-19 NOTE — Progress Notes (Signed)
Patient ID: Rebecca Walter, female   DOB: August 08, 1941, 71 y.o.   MRN: [id number] Reviewed and agree with Dr. Macky Lower management.

## 2012-05-19 NOTE — Progress Notes (Signed)
  Subjective:    Patient ID: Rebecca Walter, female    DOB: 1941/10/11, 71 y.o.   MRN: [id number]  HPI Patient arrives for follow up of change in metformin therapy and possible need for adjustment in insulin.   She states that she had two changes at once including changing PPI meds.     She is willing to retry metformin and attempt to identify if the GI symptoms were due to the PPI or the Metformin.  Home CBG readings higher than previous with multiple readings> 200.   Review of Systems     Objective:   Physical Exam        Assessment & Plan:  Slight worsening of glycemic control including multiple readings in the after noon > 200 which is new since stopping metformin.   Will attempt to restart metformin to assess if this or the PPI was the cause of GI symptoms.   If metformin causes return of GI symptoms we will will plan to meet in Rx clinic in a few weeks and adjust insulin regimen.   Patient understands treatment plan.   TTFFC 20 minutes.   Patient seen with Melynda Ripple, PharmD. Candidate and Janace Litten, PharmD, Pharmacy Resident.

## 2012-05-19 NOTE — Assessment & Plan Note (Signed)
Slight worsening of glycemic control including multiple readings in the after noon > 200 which is new since stopping metformin.   Will attempt to restart metformin to assess if this or the PPI was the cause of GI symptoms.   If metformin causes return of GI symptoms we will will plan to meet in Rx clinic in a few weeks and adjust insulin regimen.   Patient understands treatment plan.   TTFFC 20 minutes.   Patient seen with Melynda Ripple, PharmD. Candidate and Janace Litten, PharmD, Pharmacy Resident.

## 2012-05-22 ENCOUNTER — Other Ambulatory Visit: Payer: Self-pay | Admitting: Family Medicine

## 2012-05-23 ENCOUNTER — Telehealth: Payer: Self-pay | Admitting: Family Medicine

## 2012-05-23 NOTE — Telephone Encounter (Signed)
Patient is calling because all of her Rx's that were sent yesterday were for a 90 day supply which she cannot afford and would like them sent [facility] a [facility] [address].

## 2012-05-25 MED ORDER — DEMADEX 20 MG PO TABS: 20.0000 mg | ORAL_TABLET | Freq: Every day | ORAL | Status: DC

## 2012-05-25 MED ORDER — ALLOPURINOL 300 MG PO TABS: 300.0000 mg | ORAL_TABLET | Freq: Every day | ORAL | Status: DC

## 2012-05-25 MED ORDER — LISINOPRIL-HYDROCHLOROTHIAZIDE 20-12.5 MG PO TABS: 1.0000 | ORAL_TABLET | Freq: Every day | ORAL | Status: DC

## 2012-05-25 MED ORDER — GLIPIZIDE ER 10 MG PO TB24: 10.0000 mg | ORAL_TABLET | Freq: Every day | ORAL | Status: DC

## 2012-05-25 MED ORDER — FELODIPINE ER 10 MG PO TB24: 10.0000 mg | ORAL_TABLET | Freq: Every day | ORAL | Status: DC

## 2012-05-25 NOTE — Telephone Encounter (Signed)
I have re-sent all the medications that had 90 day supplies in 30 day supplies.

## 2012-05-30 ENCOUNTER — Ambulatory Visit: Payer: Medicare Other | Admitting: Pharmacist

## 2012-06-08 ENCOUNTER — Ambulatory Visit (INDEPENDENT_AMBULATORY_CARE_PROVIDER_SITE_OTHER): Payer: Medicare Other | Admitting: Family Medicine

## 2012-06-08 ENCOUNTER — Encounter: Payer: Self-pay | Admitting: Family Medicine

## 2012-06-08 VITALS — BP 141/82 | HR 93 | Temp 98.9°F | Ht 60.0 in | Wt 191.0 lb

## 2012-06-08 DIAGNOSIS — R319 Hematuria, unspecified: Secondary | ICD-10-CM

## 2012-06-08 DIAGNOSIS — N390 Urinary tract infection, site not specified: Secondary | ICD-10-CM

## 2012-06-08 LAB — POCT URINALYSIS DIPSTICK
Bilirubin, UA: NEGATIVE
Glucose, UA: NEGATIVE
Nitrite, UA: NEGATIVE

## 2012-06-08 LAB — POCT UA - MICROSCOPIC ONLY

## 2012-06-08 MED ORDER — CEPHALEXIN 500 MG PO CAPS: 500.0000 mg | ORAL_CAPSULE | Freq: Two times a day (BID) | ORAL | Status: AC

## 2012-06-08 NOTE — Patient Instructions (Signed)
It was good to see you.  I think you have a urinary tract infection.  Please take the antibiotic Keflex, one pill in the morning and one in the evening.   If the metformin continues to bother your stomach, you can call the office and make an appointment in Pharmacy clinic to see Dr. Raymondo Band to discuss changing your diabetes medications.

## 2012-06-08 NOTE — Assessment & Plan Note (Signed)
Patient with symptoms of UTI, UA and micro consistent with a UTI, will treat with Keflex x 7 days.

## 2012-06-08 NOTE — Progress Notes (Signed)
  Subjective:    Patient ID: Rebecca Walter, female    DOB: 03-05-1941, 71 y.o.   MRN: [id number]  HPI  [name] comes into clinic today for back pain, hematuria, and some painful urination that started last Sunday.  She was thought to have had a Kidney stone back in February and March, and was seeing Urology, however her CT scan was negative for a stone.  She was started on Demedex, which helped for a while.   She says that she had blood in her urine on Sunday, and has not felt well, but denies fevers and chills.  She says that it burns a little when she urinates.  She is still having some upset stomach (which she believes is a side effect of her metformin), but denies nausea and vomiting.    Review of Systems Pertinent items in HPI.     Objective:   Physical Exam BP 141/82  Pulse 93  Temp 98.9 F (37.2 C) (Oral)  Ht 5' (1.524 m)  Wt 191 lb (86.637 kg)  BMI 37.30 kg/m2 General appearance: alert, cooperative and no distress Abdomen: +BS, soft, mild suprapubic tenderness, no rebound or guarding.  Pulses: 2+ and symmetric       Assessment & Plan:

## 2012-06-19 ENCOUNTER — Other Ambulatory Visit: Payer: Self-pay | Admitting: Family Medicine

## 2012-07-14 ENCOUNTER — Ambulatory Visit: Payer: Medicare Other | Admitting: Family Medicine

## 2012-07-17 ENCOUNTER — Ambulatory Visit (INDEPENDENT_AMBULATORY_CARE_PROVIDER_SITE_OTHER): Payer: Medicare Other | Admitting: Family Medicine

## 2012-07-17 VITALS — BP 130/71 | HR 94 | Ht 60.0 in | Wt 194.0 lb

## 2012-07-17 DIAGNOSIS — IMO0002 Reserved for concepts with insufficient information to code with codable children: Secondary | ICD-10-CM

## 2012-07-17 DIAGNOSIS — I10 Essential (primary) hypertension: Secondary | ICD-10-CM

## 2012-07-17 DIAGNOSIS — L600 Ingrowing nail: Secondary | ICD-10-CM

## 2012-07-17 DIAGNOSIS — E118 Type 2 diabetes mellitus with unspecified complications: Secondary | ICD-10-CM

## 2012-07-17 NOTE — Patient Instructions (Addendum)
It was good to see you.  I will make a referral for you to see a Podiatrist about your toe.  They will help you decide the best way to treat it.    For your hips- I suspect you have a little arthritis in your hip.  You can take ibuprofen and tylenol as needed.

## 2012-07-18 ENCOUNTER — Telehealth: Payer: Self-pay | Admitting: Family Medicine

## 2012-07-18 NOTE — Telephone Encounter (Signed)
Returned call to Rebecca Walter about her hematuria.  She has had some pink colored urine for the past few days, it happens intermittently.  She has a history of kidney stones, but denies any back pain, fevers, chills, and also denies any urinary symptoms.  I told her I am happy to order a UA & Culture, but since this has happened several times in the past, I am not sure it will tell us anything new.  She says that since she is coming in Friday, she will wait and see if it is still happening then.  We had a long discussion about the intermittent hematuria, but lack of back pain or other kidney stone symptoms.  I told her I was not sure what the cause was as she has not had a UTI when this happened before.  Her urologist was also unclear about the cause of it since her CT scan was negative for a stone and she was not having symptoms.  I told her that if the problem continues she probably needs to go back to the Urologist.

## 2012-07-18 NOTE — Assessment & Plan Note (Signed)
This is a second recurrence of this problem.  Patient instructed to do BID foot soaks and try to pear back the skin.  However, she is very anxious about the idea of another in-office toe nail removal.  Will refer to Podiatry for management.

## 2012-07-18 NOTE — Telephone Encounter (Signed)
Spoke with patient.  Was seen in office yesterday by Dr. Lula Olszewski.  Patient forgot to mention she had "blood-tinged" urine x 2.  Denies any burning or pain.  Patient requesting to come in to office to give urine specimen for evaluation since she was here yesterday.  Explained to patient that office visit usually required, but I will check with Dr. Lula Olszewski since patient was in office yesterday.  Paged Dr. Lula Olszewski for advice.  Gaylene Brooks, RN

## 2012-07-18 NOTE — Assessment & Plan Note (Signed)
Well controlled per patient report.  Will continue current regimen.

## 2012-07-18 NOTE — Telephone Encounter (Signed)
Patient called back and wants to know when Dr. Lula Olszewski will be in office again.  Dr. Lula Olszewski in clinic on 07/20/12 & 07/21/12.  Patient wants Dr. Lula Olszewski to call her back about coming in for UA only.  Also, patient made appt. For 07/21/12 with Dr. Lula Olszewski in case she needs to come back in for another office visit.  If symptoms worsen, patient will call back for an appt sooner than 07/21/12.  Will route note to Dr. Lula Olszewski and call patient back.  Gaylene Brooks, RN

## 2012-07-18 NOTE — Assessment & Plan Note (Signed)
Well controlled, no changes in management at this time.

## 2012-07-18 NOTE — Telephone Encounter (Signed)
Pt is seeing blood in urine

## 2012-07-18 NOTE — Progress Notes (Signed)
  Subjective:    Patient ID: Rebecca Walter, female    DOB: [birth year], 71 y.o.   MRN: [id number]  HPI  Rebecca Walter comes in for follow up.    She complains of having trouble with her right great toe again.  She had an ingrown toe nail, and I did a lateral laminectomy about a year ago.  Since then, Dr. Earnest Bailey "painted something black" on the toe to keep it from growing back on that edge.  However, it has gotten worse again and is causing her a great deal of pain.  She has soaked her foot a few times over the past week, but has not been doing it consistently.  She says she does not think she could stand having the toe nail removed again, she thinks she would need to be put to sleep for it.   HTN- taking lisinopril/hctz and amlodipine without any side effects.  She denies chest pain, palpitations, dyspnea, LE swelling.   DM- she is not due for an A1C yet, but says her blood sugars have been running in the low 100's fasting and high 100's or low 200's in the afternoon.  She denies hypo or hyperglycemic episodes. She is taking 55 units of lantus and metformin twice a day (instead of TID), and she says her stomach feels better and her blood sugar is controlled with this regimen.   Review of Systems See HPI    Objective:   Physical Exam BP 130/71  Pulse 94  Ht 5' (1.524 m)  Wt 194 lb (87.998 kg)  BMI 37.89 kg/m2 General appearance: alert, cooperative and no distress Lungs: clear to auscultation bilaterally Heart: regular rate and rhythm, S1, S2 normal, no murmur, click, rub or gallop Extremities: extremities normal, atraumatic, no cyanosis or edema Pulses: 2+ and symmetric Right Great toe: + ingrown toe nail on medial edge of toe, no abscess or drainage, +TTP       Assessment & Plan:

## 2012-07-19 ENCOUNTER — Encounter: Payer: Self-pay | Admitting: Family Medicine

## 2012-07-19 NOTE — Addendum Note (Signed)
Addended by: Ardyth Gal on: 07/19/2012 08:17 AM   Modules accepted: Orders

## 2012-07-21 ENCOUNTER — Encounter: Payer: Self-pay | Admitting: Family Medicine

## 2012-07-21 ENCOUNTER — Ambulatory Visit (INDEPENDENT_AMBULATORY_CARE_PROVIDER_SITE_OTHER): Payer: Medicare Other | Admitting: Family Medicine

## 2012-07-21 VITALS — BP 143/77 | HR 88 | Temp 98.3°F | Ht 60.0 in | Wt 192.0 lb

## 2012-07-21 DIAGNOSIS — E118 Type 2 diabetes mellitus with unspecified complications: Secondary | ICD-10-CM

## 2012-07-21 DIAGNOSIS — R319 Hematuria, unspecified: Secondary | ICD-10-CM

## 2012-07-21 DIAGNOSIS — R300 Dysuria: Secondary | ICD-10-CM

## 2012-07-21 DIAGNOSIS — G4700 Insomnia, unspecified: Secondary | ICD-10-CM

## 2012-07-21 DIAGNOSIS — E1165 Type 2 diabetes mellitus with hyperglycemia: Secondary | ICD-10-CM

## 2012-07-21 DIAGNOSIS — IMO0002 Reserved for concepts with insufficient information to code with codable children: Secondary | ICD-10-CM

## 2012-07-21 DIAGNOSIS — L600 Ingrowing nail: Secondary | ICD-10-CM

## 2012-07-21 LAB — POCT URINALYSIS DIPSTICK
Bilirubin, UA: NEGATIVE
Blood, UA: NEGATIVE
Glucose, UA: NEGATIVE
Ketones, UA: NEGATIVE
Leukocytes, UA: NEGATIVE
Nitrite, UA: NEGATIVE

## 2012-07-21 MED ORDER — LORAZEPAM 1 MG PO TABS: 1.0000 mg | ORAL_TABLET | Freq: Every evening | ORAL | Status: DC | PRN

## 2012-07-21 NOTE — Assessment & Plan Note (Signed)
Recurrent.  Patient has seen urology and they have not found the cause of this.  Also, she has had multiple normal UA and cultures.  Today's UA is normal.  I told her that my best guess is she has a very small kidney stone that does not cause pain but is causing some bleeding.  I have recommended her to see Urology again if the problem persists.

## 2012-07-21 NOTE — Assessment & Plan Note (Signed)
Will order future A1C for patient to have drawn after 8/20 since she has already been in this month.

## 2012-07-21 NOTE — Progress Notes (Signed)
  Subjective:    Patient ID: Rebecca Walter, female    DOB: 07/22/1941, 71 y.o.   MRN: [id number]  HPI  Rebecca Walter comes in for recurrent hematuria.  She forgot to mention this when I saw her in clinic earlier this week.  She says that her urine was pink, but has now been yellow for a few days.  She denies dysuria.  She does have some chronic low back pain, but no significant pain like when she had a kidney stone before.    She is soaking her foot some as I instructed but not every day, but she is trying to pear back the skin where the toenail is ingrown.   She continues to have well controlled DM, with fasting sugars near 100.  Her next A1C is due after 8/20.   Review of Systems See HPI    Objective:   Physical Exam BP 143/77  Pulse 88  Temp 98.3 F (36.8 C) (Oral)  Ht 5' (1.524 m)  Wt 192 lb (87.091 kg)  BMI 37.50 kg/m2 General appearance: alert, cooperative and no distress Back: symmetric, no curvature. ROM normal. No CVA tenderness. Lungs: clear to auscultation bilaterally Heart: regular rate and rhythm, S1, S2 normal, no murmur, click, rub or gallop Abdomen: soft, non-tender; bowel sounds normal; no masses,  no organomegaly Extremities: extremities normal, atraumatic, no cyanosis or edema       Assessment & Plan:

## 2012-07-21 NOTE — Patient Instructions (Signed)
It was good to see you.  There is no sign of infection or blood in your urine.  Please make a lab visit for after 08/08/12 to have your A1C done.

## 2012-07-21 NOTE — Assessment & Plan Note (Signed)
Strongly reccommended BID soaks, she has appt to see podiatry in about 10 days.

## 2012-07-31 ENCOUNTER — Telehealth: Payer: Self-pay | Admitting: Family Medicine

## 2012-07-31 NOTE — Telephone Encounter (Signed)
Patient is calling because the refill for Allpurinol is not for 100mg  which is what she had been taking, it was for 300 mg.  Since she has already picked up the 300 mg, is it ok for her to cut the tablet in 1/2 which would still be over the dose that she had been taking, but not by much.  She would like the Rx corrected on her file and resent to her pharmacy and she would like a nurse to call her back with direction.

## 2012-08-01 MED ORDER — ALLOPURINOL 100 MG PO TABS: 100.0000 mg | ORAL_TABLET | Freq: Every day | ORAL | Status: DC

## 2012-08-01 NOTE — Telephone Encounter (Signed)
Rx for correct dose sent to pharmacy.

## 2012-08-03 ENCOUNTER — Telehealth: Payer: Self-pay | Admitting: Family Medicine

## 2012-08-03 NOTE — Telephone Encounter (Signed)
Patient has had some very stressful things happen and she has needed to take extra Ativan which has her running out early and she would like to be able to get her refill early and increase to 2 per day until she is seen again.  She has scheduled to come in next week.  She also said it will be ok to leave a message.

## 2012-08-04 NOTE — Telephone Encounter (Signed)
Called Ms. Bitner regarding Lorazepam and anxiety.  She says that a pipe broke in her apartment and she has had to move within a day's notice and it has been very stressful for her.  She has decided that she needs to take two lorazepam a day.  She wants me to write a prescription so she can take two a day.  I told her that I am not going to do that, that lorazepam has side effects that can be dangerous, especially in the elderly, can cause falls, drowsiness, dependence.  She has an appointment scheduled in one week, and I told her she needs to only take one lorazepam a day until I see her in the office.   Elissa Grieshop 08/04/2012 9:50 AM

## 2012-08-06 ENCOUNTER — Encounter (HOSPITAL_COMMUNITY): Payer: Self-pay | Admitting: *Deleted

## 2012-08-06 ENCOUNTER — Emergency Department (HOSPITAL_COMMUNITY)
Admission: EM | Admit: 2012-08-06 | Discharge: 2012-08-06 | Disposition: A | Discharge status: Home / Self Care | Payer: Medicare Other | Source: Home / Self Care | Attending: Family Medicine | Admitting: Family Medicine

## 2012-08-06 ENCOUNTER — Emergency Department (INDEPENDENT_AMBULATORY_CARE_PROVIDER_SITE_OTHER): Payer: Medicare Other

## 2012-08-06 ENCOUNTER — Telehealth: Payer: Self-pay | Admitting: Family Medicine

## 2012-08-06 DIAGNOSIS — M545 Low back pain, unspecified: Secondary | ICD-10-CM

## 2012-08-06 DIAGNOSIS — N390 Urinary tract infection, site not specified: Secondary | ICD-10-CM

## 2012-08-06 DIAGNOSIS — J069 Acute upper respiratory infection, unspecified: Secondary | ICD-10-CM

## 2012-08-06 LAB — POCT URINALYSIS DIP (DEVICE)
Bilirubin Urine: NEGATIVE
Glucose, UA: NEGATIVE mg/dL
Nitrite: NEGATIVE
Urobilinogen, UA: 0.2 mg/dL (ref 0.0–1.0)

## 2012-08-06 LAB — POCT I-STAT, CHEM 8
Chloride: 100 mEq/L (ref 96–112)
HCT: 46 % (ref 36.0–46.0)
Hemoglobin: 15.6 g/dL — ABNORMAL HIGH (ref 12.0–15.0)
Potassium: 3.7 mEq/L (ref 3.5–5.1)
Sodium: 141 mEq/L (ref 135–145)

## 2012-08-06 MED ORDER — CEPHALEXIN 500 MG PO CAPS: 500.0000 mg | ORAL_CAPSULE | Freq: Two times a day (BID) | ORAL | Status: AC

## 2012-08-06 NOTE — Telephone Encounter (Signed)
Patient called stating that she was very worried about her health today. Specifically, she is having bilateral lower extremity weakness, back and shoulder pain, and facial flushing. She checked her CBG and noted it was 215. Her BP on the home cuff was 159/63. She states that she is worried that she is having a heart attack or stroke and is very anxious. We talked at length about the potential causes of these symptoms. I stated that if she feels like she needs to be evaluated by a physician tonight, then she can go the [facility] before 7PM or the ED after that. If not, then she could call the clinic in the morning to make an appointment. She was in agreement with this plan.

## 2012-08-06 NOTE — ED Provider Notes (Signed)
History     CSN: [id number]  Arrival date & time 08/06/12  1745   First MD Initiated Contact with Patient 08/06/12 1802      Chief Complaint  Patient presents with  . Extremity Weakness    (Consider location/radiation/quality/duration/timing/severity/associated sxs/prior treatment) HPI Comments: 71 year old female with history of diabetes, HTN and anxiety disorder among other comorbidities. Here with the following complaints: #1 low bilateral back pain and bilateral low extremity soreness and weakness for more than one week. Patient states that she has been moving furniture as she moved from a first floor apartment to an apartment where she has to go up and down stairs, this occurred 2 weeks ago. Patient's is anxious and concerned about similar episodes of weakness in the past for which she "was told she likely had a TIA". Denies current headache, visual changes from base line, falls, speech or balance problems. Denies low extremity numbness or paresthesia. She is ambulatory with normal gait although reports "she feels her legs want to give out when she is going down stairs". #2 nonproductive cough and nasal congestion for 5 days. Associated with bilateral ear and sinus pressure. No fever. No headache. appetite is good. No nausea vomiting or diarrhea. #3 urinary frequency for few days. States her diabetes has been well controled. It was in the 120's when she checked in am today. States she was recently treated for a urinary infection. Denies pain with urination, no fever or chills.   Past Medical History  Diagnosis Date  . Diabetes mellitus   . Hypertension   . Anxiety   . Gout   . Gastroparesis due to DM   . Hyperlipidemia   . Osteopenia   . Arthritis   . GERD (gastroesophageal reflux disease)     Past Surgical History  Procedure Date  . Menisectomy     Family History  Problem Relation Age of Onset  . Stroke Mother   . Heart disease Father   . Stroke Father   .  Hypertension Father   . Cancer Sister   . Cancer Brother   . Heart disease Brother   . Diabetes Maternal Uncle     History  Substance Use Topics  . Smoking status: Never Smoker   . Smokeless tobacco: Never Used  . Alcohol Use: No    OB History    Grav Para Term Preterm Abortions TAB SAB Ect Mult Living                  Review of Systems  Constitutional: Negative for fever, chills, diaphoresis, appetite change and fatigue.       10 systems reviewed and  pertinent negative and positive symptoms are as per HPI.     HENT: Positive for congestion and sinus pressure. Negative for ear pain, sore throat, trouble swallowing and neck pain.   Respiratory: Positive for cough. Negative for shortness of breath, wheezing and stridor.   Cardiovascular: Negative for chest pain, palpitations and leg swelling.  Gastrointestinal: Negative for nausea, vomiting and diarrhea.  Genitourinary: Positive for frequency. Negative for flank pain.  Musculoskeletal: Positive for myalgias and extremity weakness.  Skin: Negative for rash.  Neurological: Positive for weakness. Negative for dizziness, tremors, seizures, syncope, speech difficulty, numbness and headaches.  Psychiatric/Behavioral: The patient is nervous/anxious.   All other systems reviewed and are negative.    Allergies  Beta adrenergic blockers and Metoprolol succinate  Home Medications   Current Outpatient Rx  Name Route Sig Dispense Refill  . ALLOPURINOL  100 MG PO TABS Oral Take 1 tablet (100 mg total) by mouth daily. 30 tablet 11    Please send all refill requests electronically.  Marland Kitchen AMLODIPINE BESYLATE 5 MG PO TABS Oral Take 1 tablet (5 mg total) by mouth daily. 30 tablet 11  . ASPIRIN 81 MG PO CHEW Oral Chew 1 tablet (81 mg total) by mouth daily. 30 tablet 5  . BD PEN NEEDLE SHORT U/F 31G X 8 MM MISC  USE AS DIRECTED 100 each 5  . CEPHALEXIN 500 MG PO CAPS Oral Take 1 capsule (500 mg total) by mouth 2 (two) times daily. 14 capsule 0   . COLCRYS 0.6 MG PO TABS  TAKE 1 TABLET BY MOUTH EVERY DAY 30 tablet 5  . DEMADEX 20 MG PO TABS Oral Take 1 tablet (20 mg total) by mouth daily. 30 tablet 11    Dispense as written.    Please send all refill requests electronically.  Marland Kitchen ESZOPICLONE 2 MG PO TABS Oral Take 2 mg by mouth at bedtime. Take immediately before bedtime     . FELODIPINE ER 10 MG PO TB24 Oral Take 1 tablet (10 mg total) by mouth daily. 30 tablet 11    Please send all refill requests electronically.  Marland Kitchen GLIPIZIDE ER 10 MG PO TB24 Oral Take 1 tablet (10 mg total) by mouth daily. 30 tablet 11    Please send all refill requests electronically.  Marland Kitchen GLUCOSE BLOOD VI STRP  Use as instructed 300 each 3  . INSULIN ASPART 100 UNIT/ML Omar SOLN Subcutaneous Inject 15 Units into the skin 3 (three) times daily before meals. prior to evening meal. 10 mL 5  . INSULIN GLARGINE 100 UNIT/ML Banks SOLN Subcutaneous Inject 50 Units into the skin daily. 5 pen PRN    Please dispense amount for one month supply.  Marland Kitchen FREESTYLE LANCETS MISC  Use as directed    . LISINOPRIL-HYDROCHLOROTHIAZIDE 20-12.5 MG PO TABS Oral Take 1 tablet by mouth daily. 30 tablet 11    Please send all refill requests electronically.  Marland Kitchen LORATADINE 10 MG PO TABS Oral Take 1 tablet (10 mg total) by mouth daily. 30 tablet 5  . LORAZEPAM 1 MG PO TABS Oral Take 1 tablet (1 mg total) by mouth at bedtime as needed for anxiety. Please fill 30 days after date on script. 30 tablet 0  . LORAZEPAM 1 MG PO TABS Oral Take 1 tablet (1 mg total) by mouth at bedtime as needed for anxiety. Please fill 60 days after date on script. 30 tablet 0  . LORAZEPAM 1 MG PO TABS Oral Take 1 tablet (1 mg total) by mouth at bedtime as needed for anxiety. 30 tablet 0  . LOVASTATIN 40 MG PO TABS  TAKE 2 TABLETS (80 MG TOTAL) BY MOUTH DAILY. 30 tablet 5  . METFORMIN HCL ER 500 MG PO TB24 Oral Take 1 tablet (500 mg total) by mouth 3 (three) times daily with meals.    Marland Kitchen METOCLOPRAMIDE HCL 5 MG PO TABS Oral Take 1  tablet (5 mg total) by mouth 3 (three) times daily before meals. 90 tablet 5  . MOMETASONE FUROATE 50 MCG/ACT NA SUSP Nasal 2 sprays by Nasal route daily. 17 g 3  . ONE-DAILY MULTI VITAMINS PO TABS Oral Take 1 tablet by mouth daily.      Marland Kitchen PANTOPRAZOLE SODIUM 40 MG PO TBEC Oral Take 1 tablet (40 mg total) by mouth daily. 30 tablet 3  . VITAMIN E 400 UNITS PO  CAPS Oral Take 800 Units by mouth daily.        BP 137/64  Pulse 82  Temp 97.4 F (36.3 C) (Oral)  Resp 17  SpO2 93%  Physical Exam  Nursing note and vitals reviewed. Constitutional: She is oriented to person, place, and time. She appears well-developed and well-nourished. No distress.  HENT:  Head: Normocephalic and atraumatic.  Right Ear: External ear normal.  Left Ear: External ear normal.  Nose: Nose normal.  Mouth/Throat: Oropharynx is clear and moist. No oropharyngeal exudate.       Nasal Congestion with erythema and swelling of nasal turbinates, clear rhinorrhea. mild pharyngeal erythema no exudates. No uvula deviation. No trismus. TM's normal.  Neck: Neck supple. No JVD present. No thyromegaly present.  Cardiovascular: Normal rate, regular rhythm and normal heart sounds.  Exam reveals no gallop and no friction rub.   No murmur heard. Pulmonary/Chest: Breath sounds normal. No respiratory distress. She has no wheezes. She has no rales. She exhibits no tenderness.       Poor effort.   Abdominal: Soft. Bowel sounds are normal. She exhibits no distension and no mass. There is no tenderness. There is no rebound and no guarding.  Lymphadenopathy:    She has no cervical adenopathy.  Neurological: She is alert and oriented to person, place, and time. She has normal strength and normal reflexes. She displays no atrophy and no tremor. No cranial nerve deficit or sensory deficit. She exhibits normal muscle tone. She displays a negative Romberg sign. She displays no seizure activity. Coordination and gait normal.       No  focalizations. No facial droop. Normal speech.  Normal rapid alternating movements. Visual fields normal by comparison. Symmetric strength and DTR's in lower extremities able to transfer by self to and from chair to table with no assistance. Intact superficial sensation and 2 point discrimination in all extremities and face.  Skin: No rash noted. She is not diaphoretic.  Psychiatric:       Patient admits to "feeling anxious which is not uncommon" for her.    ED Course  Procedures (including critical care time)  Labs Reviewed  POCT URINALYSIS DIP (DEVICE) - Abnormal; Notable for the following:    Leukocytes, UA SMALL (*)  Biochemical Testing Only. Please order routine urinalysis from main lab if confirmatory testing is needed.   All other components within normal limits  POCT I-STAT, CHEM 8 - Abnormal; Notable for the following:    BUN 25 (*)     Hemoglobin 15.6 (*)     All other components within normal limits  URINE CULTURE   Dg Chest 2 View  08/06/2012  *RADIOLOGY REPORT*  Clinical Data: Cough.  CHEST - 2 VIEW  Comparison: February 06, 2008.  Findings: Cardiomediastinal silhouette appears normal.  Scarring is noted in left lower lobe which is unchanged compared to prior exam. No acute pulmonary disease is noted.  Bony thorax is intact.  IMPRESSION: No acute cardiopulmonary abnormality seen.  Original Report Authenticated By: Venita Sheffield., M.D.     1. URI (upper respiratory infection)   2. UTI (lower urinary tract infection)   3. Low back pain    EKG with normal sinus rhythm ventricular rate 76. Nonspecific ST, T changes that are mirror image and unchanged from prior EKG in 2009. No acute ischemic changes.   MDM  71 year old female with multiple comorbidities and multiple complaints. Impress anxiety component. Normal neurologic examination. My impression is that back and low  extremity soreness and muscle fatigue likely related to a recent increase activity during her moving  to a new apartment where she has to go up and down stairs. Symptoms of upper respiratory infection with possible early sinusitis and possible UTI. Decided to treat with Keflex twice a day for 7 days. Urine culture pending. Normal electrolytes, random glucose and Vital signs are normal. Asked to followup with her primary care provider next week to monitor her symptoms or go to the emergency department if new or worsening symptoms despite treatment.        Sharin Grave, MD 08/07/12 1326

## 2012-08-06 NOTE — ED Notes (Signed)
Pt is here with complaints of weakness in bilateral legs.  Pt is ambulatory but states she feels unsteady.  Pt is also reporting pain across her upper back. Pt has history of similar episode in which she was told she possibly had a TIA.

## 2012-08-08 ENCOUNTER — Other Ambulatory Visit: Payer: Self-pay | Admitting: Family Medicine

## 2012-08-08 LAB — URINE CULTURE: Colony Count: 25000

## 2012-08-09 ENCOUNTER — Other Ambulatory Visit: Payer: Self-pay | Admitting: Family Medicine

## 2012-08-11 ENCOUNTER — Encounter: Payer: Self-pay | Admitting: Family Medicine

## 2012-08-11 ENCOUNTER — Ambulatory Visit (INDEPENDENT_AMBULATORY_CARE_PROVIDER_SITE_OTHER): Payer: Medicare Other | Admitting: Family Medicine

## 2012-08-11 VITALS — BP 154/78 | HR 88 | Temp 97.8°F | Ht 60.0 in | Wt 190.6 lb

## 2012-08-11 DIAGNOSIS — IMO0002 Reserved for concepts with insufficient information to code with codable children: Secondary | ICD-10-CM

## 2012-08-11 DIAGNOSIS — G4700 Insomnia, unspecified: Secondary | ICD-10-CM

## 2012-08-11 DIAGNOSIS — F411 Generalized anxiety disorder: Secondary | ICD-10-CM

## 2012-08-11 DIAGNOSIS — I10 Essential (primary) hypertension: Secondary | ICD-10-CM

## 2012-08-11 DIAGNOSIS — E1165 Type 2 diabetes mellitus with hyperglycemia: Secondary | ICD-10-CM

## 2012-08-11 MED ORDER — LORAZEPAM 1 MG PO TABS: 1.0000 mg | ORAL_TABLET | Freq: Every evening | ORAL | Status: DC | PRN

## 2012-08-11 NOTE — Patient Instructions (Addendum)
It was good to see you today.  Your Hemoglobin A1C is  Lab Results  Component Value Date   HGBA1C 6.6 08/11/2012  .  Remember your goal for A1C is less than 7.  Your goal for fasting morning blood sugar is 80-120.    For your blood pressure, please check it daily, and also check it if you are not feeling well.  If it is frequently above 140/90, please call the office to make an appointment to see me.

## 2012-08-13 NOTE — Assessment & Plan Note (Signed)
Will give her 15 extra lorazepam as she has run out due to her stressful life event.  She is agreeable to this, and understands that lorazepam can cause drowsiness/falls in elderly patients.

## 2012-08-13 NOTE — Assessment & Plan Note (Signed)
Elevated today, but patient reports good readings at home.  She is agreeable to brining her bp cuff to her next visit to make sure it is accurate.  She is to call the office if she has consistent readings above 140/85.

## 2012-08-13 NOTE — Assessment & Plan Note (Signed)
A1C remains well controlled, no medication changes.

## 2012-08-13 NOTE — Progress Notes (Signed)
Patient ID: Rebecca Walter, female   DOB: [birth year], 71 y.o.   MRN: [id number]  Subjective:   HPI:  Patient comes in for follow up of chronic medical problems  DM: Patient is taking Lantus, SSI, glipizide, andmetformin.  Patient is checking blood sugars.  Fasting sugars range from 60 to 220.  No hyper or hypoglycemic episodes, no polyuria or polydypisa  HTN: Taking Felodpione and lisinopril/HCTZ without difficulty.  She forgot to take them this morning, which is why her blood pressure is up.  Denies chest pain, dizziness, palpitations, LE edema.  Patient is check blood pressure, they range from 110/70 to 145/85.    Anxiety: [place] recently had to move because a water pipe busted in her apartment.  This was very stressful as it was sudden.  She has needed more than one lorazepam per day due to this stressful event.  She understands this is a habit forming medication, and that it can make people drowsy.  She says she is starting to calm down about the move and get settled in her new place.  Her family has been helping her get adjusted.   Past Medical History  Diagnosis Date  . Diabetes mellitus   . Hypertension   . Anxiety   . Gout   . Gastroparesis due to DM   . Hyperlipidemia   . Osteopenia   . Arthritis   . GERD (gastroesophageal reflux disease)    Family History  Problem Relation Age of Onset  . Stroke Mother   . Heart disease Father   . Stroke Father   . Hypertension Father   . Cancer Sister   . Cancer Brother   . Heart disease Brother   . Diabetes Maternal Uncle    History  Substance Use Topics  . Smoking status: Never Smoker   . Smokeless tobacco: Never Used  . Alcohol Use: No    ROS: Pertinent items noted in HPI     Objective:  Physical Exam:  BP 154/78  Pulse 88  Temp 97.8 F (36.6 C) (Oral)  Ht 5' (1.524 m)  Wt 190 lb 9.6 oz (86.456 kg)  BMI 37.22 kg/m2 General appearance: alert, cooperative and no distress Neck: no adenopathy, no JVD, supple,  symmetrical, trachea midline and thyroid not enlarged, symmetric, no tenderness/mass/nodules Lungs: clear to auscultation bilaterally Heart: regular rate and rhythm, S1, S2 normal, no murmur, click, rub or gallop Extremities: extremities normal, atraumatic, no cyanosis or edema Pulses: 2+ and symmetric       Assessment & Plan:

## 2012-08-23 ENCOUNTER — Encounter: Payer: Self-pay | Admitting: Family Medicine

## 2012-08-23 ENCOUNTER — Ambulatory Visit (INDEPENDENT_AMBULATORY_CARE_PROVIDER_SITE_OTHER): Payer: Medicare Other | Admitting: Family Medicine

## 2012-08-23 VITALS — BP 135/64 | HR 96 | Temp 98.9°F | Ht 60.0 in | Wt 192.0 lb

## 2012-08-23 DIAGNOSIS — I10 Essential (primary) hypertension: Secondary | ICD-10-CM

## 2012-08-23 MED ORDER — AMLODIPINE BESYLATE 10 MG PO TABS: 10.0000 mg | ORAL_TABLET | Freq: Every day | ORAL | Status: DC

## 2012-08-23 NOTE — Patient Instructions (Signed)
Please stop taking the Felodipine, and start taking Amlodipine instead.  Also, Please continue to take your Lisinopril/HCTZ pill every day. I think you may need a new blood pressure cuff at home if the one you have is too small.   Please ask the front desk to schedule you for a Medicare Wellness visit with our Health coach, Arlys John.  Please be sure to bring all your medications to that visit, and your blood pressure cuff.

## 2012-08-23 NOTE — Assessment & Plan Note (Signed)
Elevated, but improved after sitting for 5 minutes on manual check.  Unclear if readings at home are accurate.  Will d/c felodipine and chang to Amlodipine as patient seems to think she is having a side effect.  I have asked her to get a new BP cuff, and to schedule an appointment with our Health Coach.  Red flags reviewed for return to care.

## 2012-08-23 NOTE — Progress Notes (Signed)
  Subjective:    Patient ID: Rebecca Walter, female    DOB: April 17, 1941, 71 y.o.   MRN: [id number]  HPI  Maryjean comes in for her Blood pressure because it has been running high at home.  She is not sure if it is really high or if her cuff at home is now too small for her arm.  She has recorded BP's of 140/65-165/75.  She takes her Lisinopril/HCTZ in the am and the felodipine in the afternoon.  She complains that the felodipine makes her feel light headed. She denies chest pain, dyspnea, or LE edema.   Review of Systems See HPI    Objective:   Physical Exam Filed Vitals:   08/23/12 1102 08/23/12 1118  BP: 148/79 135/64  Pulse: 96   Temp: 98.9 F (37.2 C)   TempSrc: Oral   Height: 5' (1.524 m)   Weight: 192 lb (87.091 kg)    General appearance: alert, cooperative and no distress Eyes: conjunctivae/corneas clear. PERRL, EOM's intact. Fundi benign. Neck: no carotid bruit, no JVD, supple, symmetrical, trachea midline and thyroid not enlarged, symmetric, no tenderness/mass/nodules Lungs: clear to auscultation bilaterally Heart: regular rate and rhythm, S1, S2 normal, no murmur, click, rub or gallop Extremities: extremities normal, atraumatic, no cyanosis or edema Pulses: 2+ and symmetric      Assessment & Plan:

## 2012-08-29 ENCOUNTER — Telehealth: Payer: Self-pay | Admitting: Family Medicine

## 2012-08-29 NOTE — Telephone Encounter (Signed)
Patient has been on lisinopril /HCTZ for a while . Started amlodipine on 09/04. BP has been running in the 160 systolic range and 75 diastolic range.  Last night her head was hurting on the top ,and also neck hurting. She felt a little dizzy and just a bit "off".  She took a second  amlopidine and felt better.   Encouraged patient to not take extra medication but she states she knows how she feels when her BP is up  . This morning she feels better . No headache , dizziness. BP readings this AM 160/75, 148/70 and 148/72.  Has appointment with Rosalita Chessman tomorrow but will switch to visit with MD instead if needed she states.   Will forward message to Dr. Lula Olszewski.

## 2012-08-29 NOTE — Telephone Encounter (Signed)
Called patient back and she states she has already cancelled appointment with Rosalita Chessman tomorrow and has rescheduled. She just wants to see Dr. Lula Olszewski .   Patient had already called and cancelled the appointment and scheduled with Dr. Lula Olszewski. States she is feeling better today.

## 2012-08-29 NOTE — Telephone Encounter (Signed)
Larita Fife,  Thanks for this note- For now if she is feeling OK we can keep her appt with Rosalita Chessman, I feel this is a patient who would benefit from the extra time Rosalita Chessman has (esp. Reviewing medications).  I am in clinic tomorrow so I will be available if Rosalita Chessman needs me. I am in a meeting right now and cannot call her- can one of you call her and let her know that and remind her to bring ALL of her medications?  Thanks!

## 2012-08-29 NOTE — Telephone Encounter (Signed)
Pt states the her BP was still elevated and she took 2 Amlodipine last night and she felt better.  She is needing to know what to do.  Feels that the Lisinopril is not working.

## 2012-08-30 ENCOUNTER — Encounter: Payer: Self-pay | Admitting: Family Medicine

## 2012-08-30 ENCOUNTER — Ambulatory Visit: Payer: Medicare Other | Admitting: Home Health Services

## 2012-08-30 ENCOUNTER — Ambulatory Visit (INDEPENDENT_AMBULATORY_CARE_PROVIDER_SITE_OTHER): Payer: Medicare Other | Admitting: Family Medicine

## 2012-08-30 VITALS — BP 140/78 | HR 86 | Temp 98.2°F | Ht 60.0 in | Wt 191.0 lb

## 2012-08-30 DIAGNOSIS — I10 Essential (primary) hypertension: Secondary | ICD-10-CM

## 2012-08-30 MED ORDER — HYDRALAZINE HCL 10 MG PO TABS: 10.0000 mg | ORAL_TABLET | Freq: Three times a day (TID) | ORAL | Status: DC | PRN

## 2012-08-30 NOTE — Assessment & Plan Note (Signed)
BP today borderline controlled, but her cuff is reading about 20 points systolic higher than the BP machine here.  Will ask her to get a new one or have that one re-calibrated.  Stressed to take medications as prescribed- max dose of amlodipine is 10.  Continue Lisinopril/HCTZ at current dose.  Will add hydralazine 10 mg po TID PRN- and if she tolerates this and BP continues to run high can consider scheduling the medication.

## 2012-08-30 NOTE — Patient Instructions (Signed)
Please continue to take the lisinopril/HCTZ daily, and the amlodipine daily (but only once a day).  You can take this new medication, Hydralazine as needed for elevated blood pressure.  Please look into getting a new blood pressure machine or having the one you own calibrated.  Please keep you appointment for your Health Coach coming up in a few weeks, but let me know if you are having any problems before then.

## 2012-08-30 NOTE — Progress Notes (Signed)
  Subjective:    Patient ID: Rebecca Walter, female    DOB: 1941/05/02, 71 y.o.   MRN: [id number]  HPI  Rebecca Walter comes in due to symptomatic elevated blood pressure.  Amlodipine was recently added for her blood pressure control. She called the office yesterday because she has had several episodes of dizziness, where her blood pressure was 160-170 systolic.  She denies any chest pain with these episodes.  She has been taking 2 ten mg tablets (20 total) of Amlodipine instead of one (10 total).  She did not realize that 10 was the max dose. She denies any chest pain, leg swelling, palpitations or dyspnea associated with the high blood pressure.   Review of Systems See HPI    Objective:   Physical Exam BP 140/78  Pulse 86  Temp 98.2 F (36.8 C) (Oral)  Ht 5' (1.524 m)  Wt 191 lb (86.637 kg)  BMI 37.30 kg/m2 General appearance: alert, cooperative and no distress Neck: no adenopathy, no carotid bruit, no JVD, supple, symmetrical, trachea midline and thyroid not enlarged, symmetric, no tenderness/mass/nodules Lungs: clear to auscultation bilaterally Heart: stable 3/6 Systolic murmur.  Abdomen: soft, non-tender; bowel sounds normal; no masses,  no organomegaly Extremities: extremities normal, atraumatic, no cyanosis or edema       Assessment & Plan:

## 2012-09-03 ENCOUNTER — Other Ambulatory Visit: Payer: Self-pay | Admitting: Family Medicine

## 2012-09-05 ENCOUNTER — Other Ambulatory Visit: Payer: Self-pay | Admitting: Family Medicine

## 2012-09-13 ENCOUNTER — Ambulatory Visit (INDEPENDENT_AMBULATORY_CARE_PROVIDER_SITE_OTHER): Payer: Medicare Other | Admitting: Home Health Services

## 2012-09-13 ENCOUNTER — Encounter: Payer: Self-pay | Admitting: Home Health Services

## 2012-09-13 VITALS — BP 131/71 | HR 76 | Temp 98.3°F | Ht 60.0 in | Wt 189.0 lb

## 2012-09-13 DIAGNOSIS — Z0000 Encounter for general adult medical examination without abnormal findings: Secondary | ICD-10-CM

## 2012-09-13 NOTE — Progress Notes (Signed)
I have reviewed this visit and discussed with Arlys John and agree with her documentation. Aubryana Vittorio 09/13/2012 3:13 PM

## 2012-09-13 NOTE — Patient Instructions (Signed)
1. Schedule an eye exam follow up. 2. Schedule a dermatology exam follow up. 3. Consider scheduling a mammogram. 4. Please get your flu vaccine at your next office visit. 5. Follow up with Rosalita Chessman weekly for blood pressure monitoring. [phone]

## 2012-09-13 NOTE — Progress Notes (Signed)
Patient here for annual wellness visit, patient reports: Risk Factors/Conditions needing evaluation or treatment: Pt does not have any new risk factors that need evaluation. Home Safety: Pt lives by self in 1 story apartment.  Pt reports having smoke detectors, does not have adaptive equipment in bathroom. Other Information: Corrective lens: Pt wears daily corrective lens.  Reports having cataracts, has not seen eye dr in several years.  Recommended pt schedule eye appointment. Dentures: Pt has full upper dentures.  Does not have a current dentist. Memory: Pt reports some memory problems. Patient's Mini Mental Score (recorded in doc. flowsheet): 29 Pt reports no hearing problems or ringing in ears.   Balance/Gait: Pt is dependent on cane for balance Balance Abnormal Patient value  Sitting balance    Sit to stand x   Attempts to arise    Immediate standing balance    Standing balance    Nudge    Eyes closed- Romberg    Tandem stance    Back lean    Neck Rotation    360 degree turn    Sitting down x    Gait Abnormal Patient value  Initiation of gait x   Step length-left    Step length-right    Step height-left    Step height-right    Step symmetry    Step continuity    Path deviation    Trunk movement    Walking stance x       Annual Wellness Visit Requirements Recorded Today In  Medical, family, social history Past Medical, Family, Social History Section  Current providers Care team  Current medications Medications  Wt, BP, Ht, BMI Vital signs  Hearing assessment (welcome visit) Progress notes  Tobacco, alcohol, illicit drug use History  ADL Nurse Assessment  Depression Screening Nurse Assessment  Cognitive impairment Nurse Assessment  Mini Mental Status Document Flowsheet  Fall Risk Nurse Assessment  Home Safety Progress Note  End of Life Planning (welcome visit) Social Documentation  Medicare preventative services Progress Note  Risk factors/conditions  needing evaluation/treatment Progress Note  Personalized health advice Patient Instructions, goals, letter  Diet & Exercise Social Documentation  Emergency Contact Social Documentation  Seat Belts Social Documentation  Sun exposure/protection Social Documentation    Prevention Plan:   Recommended Medicare Prevention Screenings Women over 65 Test For Frequency Date of Last- BOLD if needed  Breast Cancer 1-2 yrs 7/09  Cervical Cancer 1-3 yrs NI- due to age  Colorectal Cancer 1-10 yrs Pt reported done  Osteoporosis once 7/09  Cholesterol 5 yrs 10/11  Diabetes yearly 8/13  HIV yearly declined  Influenza Shot yearly Pt will get at next appt  Pneumonia Shot once 11/05  Zostavax Shot once declined

## 2012-09-15 ENCOUNTER — Other Ambulatory Visit: Payer: Medicare Other

## 2012-09-15 DIAGNOSIS — IMO0002 Reserved for concepts with insufficient information to code with codable children: Secondary | ICD-10-CM

## 2012-09-15 DIAGNOSIS — E785 Hyperlipidemia, unspecified: Secondary | ICD-10-CM

## 2012-09-15 DIAGNOSIS — E1165 Type 2 diabetes mellitus with hyperglycemia: Secondary | ICD-10-CM

## 2012-09-15 LAB — LIPID PANEL
HDL: 40 mg/dL (ref >39)
LDL Cholesterol: 74 mg/dL (ref 0–99)
Total CHOL/HDL Ratio: 3.4 Ratio
VLDL: 23 mg/dL (ref 0–40)

## 2012-09-15 LAB — BASIC METABOLIC PANEL
Calcium: 9.7 mg/dL (ref 8.4–10.5)
Glucose, Bld: 143 mg/dL — ABNORMAL HIGH (ref 70–99)
Sodium: 139 mEq/L (ref 135–145)

## 2012-09-15 NOTE — Progress Notes (Signed)
BMP AND FLP DONE TODAY Rebecca Walter 

## 2012-09-17 ENCOUNTER — Other Ambulatory Visit: Payer: Self-pay | Admitting: Family Medicine

## 2012-09-18 ENCOUNTER — Other Ambulatory Visit: Payer: Self-pay | Admitting: Family Medicine

## 2012-09-18 ENCOUNTER — Encounter: Payer: Self-pay | Admitting: Family Medicine

## 2012-09-18 DIAGNOSIS — Z1231 Encounter for screening mammogram for malignant neoplasm of breast: Secondary | ICD-10-CM

## 2012-09-20 ENCOUNTER — Other Ambulatory Visit: Payer: Self-pay | Admitting: Family Medicine

## 2012-09-20 ENCOUNTER — Ambulatory Visit (INDEPENDENT_AMBULATORY_CARE_PROVIDER_SITE_OTHER): Payer: Medicare Other | Admitting: *Deleted

## 2012-09-20 DIAGNOSIS — Z23 Encounter for immunization: Secondary | ICD-10-CM

## 2012-09-20 NOTE — Telephone Encounter (Signed)
Patient is calling because she was told by her pharmacy that the Rx for Lorazepam was denied.  From what I can tell, the Rx request just came in this morning and was forwarded to Dr. Lula Olszewski a few minutes ago.  She has an appt for her Flu shot this afternoon, Dr. Lula Olszewski is here this morning, so she needs to know if she needs to be seen to get this refill.

## 2012-09-21 ENCOUNTER — Ambulatory Visit (INDEPENDENT_AMBULATORY_CARE_PROVIDER_SITE_OTHER): Payer: Medicare Other | Admitting: Family Medicine

## 2012-09-21 ENCOUNTER — Encounter: Payer: Self-pay | Admitting: Family Medicine

## 2012-09-21 VITALS — BP 140/64 | HR 89 | Temp 98.6°F | Ht 60.0 in | Wt 189.0 lb

## 2012-09-21 DIAGNOSIS — F411 Generalized anxiety disorder: Secondary | ICD-10-CM

## 2012-09-21 DIAGNOSIS — G4700 Insomnia, unspecified: Secondary | ICD-10-CM

## 2012-09-21 DIAGNOSIS — E669 Obesity, unspecified: Secondary | ICD-10-CM

## 2012-09-21 DIAGNOSIS — I10 Essential (primary) hypertension: Secondary | ICD-10-CM

## 2012-09-21 MED ORDER — LORAZEPAM 1 MG PO TABS: 1.0000 mg | ORAL_TABLET | Freq: Every evening | ORAL | Status: DC | PRN

## 2012-09-21 NOTE — Assessment & Plan Note (Signed)
Congratulated her on increased exercise and weight loss- encouraged her to keep it up.

## 2012-09-21 NOTE — Assessment & Plan Note (Signed)
Fairly well treated with lorazapam, will continue at current dosage.

## 2012-09-21 NOTE — Patient Instructions (Signed)
It was good to see you.  Your blood pressure was 140/64 on the re-check.  Please continue to check it, and if it is running above 140/90 on a regular basis, please call the office to speak to Tamms or me.

## 2012-09-21 NOTE — Assessment & Plan Note (Signed)
Fairly well controlled on current regimen, will continue medications as prescribed for now- f/u in 3 months.

## 2012-09-21 NOTE — Assessment & Plan Note (Signed)
Refill Lorazapan, 1 pill qhs, three month supply.

## 2012-09-21 NOTE — Progress Notes (Signed)
Patient ID: Rebecca Walter, female   DOB: 1941/11/22, 71 y.o.   MRN: [id number]  Subjective:    HPI:   Patient comes in for follow up of chronic medical problems  Anxiety and Insomnia: Patient has been doing better lately with anxiety, she is worrying less.  However, she still has difficulty sleeping because she cannot shut her brain off.  She is taking one lorazepam at bedtime.   HTN: Taking Lisinopril/HCTZ and Norvasc without difficulty, she is also taking Hydralazine two to three times a day.  Denies chest pain, dizziness, palpitations, LE edema.  Patient is check blood pressure, they are running around 140/65.    Obesity: She has been increasing her activity, she [place], which takes about 20 minutes.  She has lost about 5 lbs in the past few months.    Past Medical History  Diagnosis Date  . Diabetes mellitus   . Hypertension   . Anxiety   . Gout   . Gastroparesis due to DM   . Hyperlipidemia   . Osteopenia   . Arthritis   . GERD (gastroesophageal reflux disease)    Family History  Problem Relation Age of Onset  . Stroke Mother   . Heart disease Father   . Stroke Father   . Hypertension Father   . Cancer Sister   . Cancer Brother   . Heart disease Brother   . Diabetes Maternal Uncle    History  Substance Use Topics  . Smoking status: Never Smoker   . Smokeless tobacco: Never Used  . Alcohol Use: No    ROS:  Negative except stated in HPI    Objective:  Physical Exam:  BP 140/64  Pulse 89  Temp 98.6 F (37 C) (Oral)  Ht 5' (1.524 m)  Wt 189 lb (85.73 kg)  BMI 36.91 kg/m2 General appearance: alert, cooperative and no distress Neck: symmetrical, trachea midline and thyroid not enlarged, symmetric, no tenderness/mass/nodules Lungs: clear to auscultation bilaterally Heart: regular rate and rhythm, S1, S2 normal, no murmur, click, rub or gallop Extremities: extremities normal, atraumatic, no cyanosis or  edema Pulses: 2+ and symmetric       Assessment & Plan:

## 2012-09-29 ENCOUNTER — Telehealth: Payer: Self-pay | Admitting: Home Health Services

## 2012-09-29 NOTE — Telephone Encounter (Signed)
Spoke with Rebecca Walter.   Pt reports taking medications daily without missing any days. Reports blood pressure averaging between 140/75.  Blood glucose averaging between 120-140.  Pt reports walking daily, sometimes walking 2x a day.  Pt reports eating more vegetables daily.  Pt set goal to continue walking daily, monitoring bp/bs.    Pt's overall goal is weight loss, bp/dm management.

## 2012-10-05 ENCOUNTER — Telehealth: Payer: Self-pay | Admitting: Home Health Services

## 2012-10-05 NOTE — Telephone Encounter (Signed)
Spoke with Rebecca Walter.  Pt reports taking medications daily without missing any days.  Fasting blood glucose 99 today. Most recent blood pressure 148/67.  Pt reports feeling good, more energy.  At home most recent weight 185 lbs.  Pt reports walking "two laps" every morning 5-6 times a week.  Also tries to walk during the afternoon a few times a week. Pt reports increasing vegetables, beans, and chicken.  It eating less starches and sweets.  Pt set goal to continue daily walking, at home measuring of bp/bs, and to continue with dietary changes this next week.  Pt's overall goal is weight loss, dm/bp management.

## 2012-10-12 ENCOUNTER — Other Ambulatory Visit: Payer: Self-pay | Admitting: Dermatology

## 2012-10-17 ENCOUNTER — Telehealth: Payer: Self-pay | Admitting: Home Health Services

## 2012-10-17 ENCOUNTER — Ambulatory Visit
Admission: RE | Admit: 2012-10-17 | Discharge: 2012-10-17 | Disposition: A | Discharge status: Home / Self Care | Payer: Medicare Other | Source: Ambulatory Visit | Attending: Family Medicine | Admitting: Family Medicine

## 2012-10-17 DIAGNOSIS — Z1231 Encounter for screening mammogram for malignant neoplasm of breast: Secondary | ICD-10-CM

## 2012-10-17 NOTE — Telephone Encounter (Signed)
Spoke with Rebecca Walter.  Reports fasting blood sugar today at 73.  Pt has gone down to 40 units of lantus.  Pt reports recent bp at 139/68. Pt reports taking all medications as prescribed with out missing any days. Pt reports talking 1/2 pill of demadex.  Pt reports walking daily around apartment complex and feels good.  Reports eating more vegetables.  Reports getting dermatology appoint done, schedule eye exam for 11/18, and is getting mammogram done today.  Pt set goal to continue daily walking this next week and dietary changes.  Pt's overall goal is weight loss, dm/bp management.

## 2012-10-19 ENCOUNTER — Other Ambulatory Visit: Payer: Self-pay | Admitting: Family Medicine

## 2012-10-19 DIAGNOSIS — R928 Other abnormal and inconclusive findings on diagnostic imaging of breast: Secondary | ICD-10-CM

## 2012-10-23 ENCOUNTER — Telehealth: Payer: Self-pay | Admitting: Home Health Services

## 2012-10-23 NOTE — Telephone Encounter (Signed)
Spoke with Adelayde.  Pt reports taking all medications as prescribed with the exception of adjusting Lantus. See below.   Pt reports having a low blood sugar episode this past Sunday. She said she recovered after eating something but did not give her self novolog Sunday night.  Pt reports fasting blood sugar today over 200. She expected it to be high since she did not take novolog.  She gave her self 50 units of Lantus this morning before eating.   Pt feels fine today but was concerned about yesterday's episode.  Pt will continue to monitor bs and adjust insulin as needed.  Informed patient that if this continued to happen to schedule appoint with PCP.  Pt understood.  We talked about reasons for low blood sugar including her increased exercise (walking) eating lighter breakfasts on the weekends and possibly any weight loss she may have.  Pt will schedule 3 month DM follow up in the beginning of December to talk about these changes with PCP.  Also recommended meeting with PharmD if she has specific insulin questions.   Pt set goal to continue walking daily and eating fruits and veggies this next week.  Pt's overall goal is weight loss, dm management.

## 2012-10-25 ENCOUNTER — Ambulatory Visit
Admission: RE | Admit: 2012-10-25 | Discharge: 2012-10-25 | Disposition: A | Discharge status: Home / Self Care | Payer: Medicare Other | Source: Ambulatory Visit | Attending: Family Medicine | Admitting: Family Medicine

## 2012-10-25 DIAGNOSIS — R928 Other abnormal and inconclusive findings on diagnostic imaging of breast: Secondary | ICD-10-CM

## 2012-10-27 ENCOUNTER — Other Ambulatory Visit: Payer: Medicare Other

## 2012-11-02 ENCOUNTER — Encounter: Payer: Self-pay | Admitting: Family Medicine

## 2012-11-02 ENCOUNTER — Ambulatory Visit (INDEPENDENT_AMBULATORY_CARE_PROVIDER_SITE_OTHER): Payer: Medicare Other | Admitting: Family Medicine

## 2012-11-02 VITALS — BP 166/59 | HR 85 | Temp 97.9°F | Ht 60.0 in | Wt 187.0 lb

## 2012-11-02 DIAGNOSIS — I10 Essential (primary) hypertension: Secondary | ICD-10-CM

## 2012-11-02 MED ORDER — BENAZEPRIL HCL 20 MG PO TABS: 20.0000 mg | ORAL_TABLET | Freq: Every day | ORAL | Status: DC

## 2012-11-02 MED ORDER — HYDROCHLOROTHIAZIDE 12.5 MG PO CAPS: ORAL_CAPSULE | ORAL | Status: DC

## 2012-11-02 NOTE — Assessment & Plan Note (Signed)
Will d/c hydralazine and prinzide, try lower dose of HCTZ and try different ace inhibitor.  Continue daily BP monitoring. She will follow up on the phone with our health coach.

## 2012-11-02 NOTE — Patient Instructions (Signed)
Your blood pressure today was BP: 166/59 mmHg.  Remember your goal blood pressure is about 130/80.  Please be sure to take your medication every day.    Please stop taking the hydralazine and the lisinopril/hctz pills  - Start taking Lotensin (benazepril) 20 mg daily - Start taking HCTZ 1/2 a tablet (6.25 mg) daily.

## 2012-11-02 NOTE — Progress Notes (Signed)
  Subjective:    Patient ID: Rebecca Walter, female    DOB: [birth year], 71 y.o.   MRN: [id number]  HPI  Julizza comes in due to complain of numbness, fluctuating blood pressures.  She says she felt numb and dizzy a few days ago.  She called the pharmacist who said several of her medications could be causing it.  She has had difficulty taking BP medications in the past.  She denies vision changes, slurred speech, chest pain, dyspnea.  She did not take her amlodipine today, but did take the lisinopril/hctz.    Review of Systems See HPI    Objective:   Physical Exam BP 166/59  Pulse 85  Temp 97.9 F (36.6 C) (Oral)  Ht 5' (1.524 m)  Wt 187 lb (84.823 kg)  BMI 36.52 kg/m2 General appearance: alert, cooperative and no distress Eyes: conjunctivae/corneas clear. PERRL, EOM's intact. Fundi benign. Neck: no carotid bruit, no JVD and supple, symmetrical, trachea midline Lungs: clear to auscultation bilaterally Heart:Stable systolic murmur  Neck: Normal ROM, normal sensation and reflexes and strength of bilateral upper extremities.        Assessment & Plan:

## 2012-11-03 ENCOUNTER — Telehealth: Payer: Self-pay | Admitting: Home Health Services

## 2012-11-03 ENCOUNTER — Telehealth: Payer: Self-pay | Admitting: Family Medicine

## 2012-11-03 NOTE — Telephone Encounter (Signed)
Pt called with complaint about eyes.  She said they are dry and feel a little prickly.  She has used visine but that didn't really help. She also took an Advil but still has some complaints.    She reports having an eye appointment scheduled on Monday.   Told her to not put make up on over the weekend, keep her hands away from eyes and follow up with eye specialist on Monday.  If pain become unbearable or there are other symptoms to contact us.

## 2012-11-03 NOTE — Telephone Encounter (Signed)
Returned call to patient.  She has "changed her mind" and wants to continue on HCTZ 12.5 mg daily.  Wants to know if we can call pharmacy to change back to 1 tablet daily instead of 1/2 tab daily.  She has Lisinopril/HCTZ (12.5mg ) tabs at home and will continue to take these.  Will route request to Dr. Lula Olszewski and call patient back.  Gaylene Brooks, RN

## 2012-11-03 NOTE — Telephone Encounter (Signed)
Pt was given new script for hctz and was supposed to cut it in half - this comes in capsule and cannot be cut.

## 2012-11-06 MED ORDER — HYDROCHLOROTHIAZIDE 12.5 MG PO CAPS: 12.5000 mg | ORAL_CAPSULE | Freq: Every day | ORAL | Status: DC

## 2012-11-06 NOTE — Telephone Encounter (Signed)
Left message for patient that rx was sent 11/18.

## 2012-11-06 NOTE — Telephone Encounter (Signed)
Rx sent 

## 2012-11-15 ENCOUNTER — Telehealth: Payer: Self-pay | Admitting: Home Health Services

## 2012-11-15 NOTE — Telephone Encounter (Signed)
Spoke with Rebecca Walter.  Pt reports taking medications as prescribed.  Started new BP medicates 3 days ago.  BP today was 159/70 and 150/71.  Pt is a little concerned it is high.  Pt reports not dizziness or weakness. We talked about continuing medications as prescribed through the weekend, monitoring bp and touching base with [place] next week. Pt was agreeable.    We talked about how weight loss and exercise can help control bp as well.  Pt reported walking several times over the past week.    Pt set goal to continue walking daily this next week.  Pt's overall goal is weight loss, exercise, bp management.

## 2012-11-21 ENCOUNTER — Other Ambulatory Visit: Payer: Self-pay | Admitting: Family Medicine

## 2012-11-22 ENCOUNTER — Telehealth: Payer: Self-pay | Admitting: Home Health Services

## 2012-11-22 NOTE — Telephone Encounter (Signed)
Spoke with Rebecca Walter.  Pt reports taking medications as prescribed.  Reports no dizziness or weakness with recent med changes.  At home bp value 149/72.  Blood glucose value yesterday was 252.  Reports feeling stressed with having to go to eye dr.  Reports dr recommended cataract surgery. She is considering this but will have to figure out the financial part if she goes through with it.  Pt reports walking most days and feels okay.  Pt set goal to continue to monitory bp/bs, continue daily walks.  Pt's overall goal is weight loss, bp/bs management.

## 2012-11-22 NOTE — Telephone Encounter (Signed)
Patient called to check on the status of her refill on Lorazepam.  The last time she requested a refill, she was told that she would need to be seen first.  She was last seen on 11/14 so I let her know that she probably wouldn't need to be seen for this refill since she was here less than a month ago.  She wants to let Dr. Lula Olszewski know that she is completely out of this medication.

## 2012-11-23 ENCOUNTER — Encounter: Payer: Self-pay | Admitting: Family Medicine

## 2012-11-23 ENCOUNTER — Ambulatory Visit (INDEPENDENT_AMBULATORY_CARE_PROVIDER_SITE_OTHER): Payer: Medicare Other | Admitting: Family Medicine

## 2012-11-23 VITALS — BP 130/74 | HR 71 | Temp 98.3°F | Ht 60.0 in | Wt 188.7 lb

## 2012-11-23 DIAGNOSIS — E1165 Type 2 diabetes mellitus with hyperglycemia: Secondary | ICD-10-CM

## 2012-11-23 DIAGNOSIS — G4700 Insomnia, unspecified: Secondary | ICD-10-CM

## 2012-11-23 DIAGNOSIS — E118 Type 2 diabetes mellitus with unspecified complications: Secondary | ICD-10-CM

## 2012-11-23 DIAGNOSIS — IMO0002 Reserved for concepts with insufficient information to code with codable children: Secondary | ICD-10-CM

## 2012-11-23 DIAGNOSIS — F411 Generalized anxiety disorder: Secondary | ICD-10-CM

## 2012-11-23 DIAGNOSIS — I10 Essential (primary) hypertension: Secondary | ICD-10-CM

## 2012-11-23 LAB — POCT GLYCOSYLATED HEMOGLOBIN (HGB A1C): Hemoglobin A1C: 6.9

## 2012-11-23 MED ORDER — LORAZEPAM 1 MG PO TABS: 1.0000 mg | ORAL_TABLET | Freq: Every evening | ORAL | Status: DC | PRN

## 2012-11-23 NOTE — Assessment & Plan Note (Signed)
Improved control.  I have asked her to bring her BP machine and cuff to compare to ours.  No medication changes today. Encouraged exercise and diet modification.

## 2012-11-23 NOTE — Progress Notes (Signed)
  Subjective:    Patient ID: Rebecca Walter, female    DOB: 1941/02/15, 71 y.o.   MRN: [id number]  HPI  [name] comes in for follow up  Anxiety- patient had been doing better, but was recently told she had to have cataract surgery, and it will be expensive, so she has been very worried about this. She has started to feel better though after talking with her children.   DM: Patient is taking Lantus, SSI, metformin, glucotrol.  Patient is checking blood sugars.  Fasting sugars range from 100 to 120.  No hyper or hypoglycemic episodes, no polyuria or polydypisa  HTN: Taking Norvasc, lotensin, HCTZ without difficulty.  Feels much better since d/c'ing lisinopril  Denies chest pain, dizziness, palpitations, LE edema.  Patient does check blood pressure, they range from 130's/70's to 140's/90's.   I have reviewed the patient's medical history in detail and updated the computerized patient record.  Review of Systems Pertinent items in HPI    Objective:   Physical Exam BP 130/74  Pulse 71  Temp 98.3 F (36.8 C) (Oral)  Ht 5' (1.524 m)  Wt 188 lb 11.2 oz (85.594 kg)  BMI 36.85 kg/m2 General appearance: alert, cooperative and no distress Lungs: clear to auscultation bilaterally Heart: regular rate and rhythm, S1, S2 normal, no murmur, click, rub or gallop Extremities: extremities normal, atraumatic, no cyanosis or edema.   Diabetic foot exam: Sensation to micro fiber in tact throughout bilateral feet.  Pulses: 2+ and symmetric        Assessment & Plan:

## 2012-11-23 NOTE — Assessment & Plan Note (Signed)
Well controlled, no medication changes, encouraged continued exercise and diet modifications.

## 2012-11-23 NOTE — Patient Instructions (Signed)
It was good to see you today.  Your Hemoglobin A1C is  Lab Results  Component Value Date   HGBA1C 6.9 11/23/2012   Remember your goal for A1C is less than 7.  Your goal for fasting morning blood sugar is 80-120.   Your blood pressure today was BP: 130/74 mmHg.  Remember your goal blood pressure is about 130/80.  Please be sure to take your medication every day.   Great Job! Please continue your walking for exercise and watching your diet.  Please ask your eye doctors to send me notes when you see them.   Please come back to see me in about 3 months.  Happy Holidays!

## 2012-11-23 NOTE — Assessment & Plan Note (Signed)
Stable, will refill lorazepam at current dose.

## 2012-12-21 ENCOUNTER — Telehealth: Payer: Self-pay | Admitting: Family Medicine

## 2012-12-21 ENCOUNTER — Other Ambulatory Visit: Payer: Self-pay | Admitting: Family Medicine

## 2012-12-21 NOTE — Telephone Encounter (Signed)
Is asking to speak to nurse about whether she needs to come in.  Been blowing mucus and is not sure if she needs to come in.Marland KitchenMarland Kitchen

## 2012-12-21 NOTE — Telephone Encounter (Signed)
Returned call to patient.  Has congestion and blowing "green-brown, slightly bloody mucus" x 1 1/2 weeks.  Tried Vicks Vapor Rub.  Unable to take decongestant due to BP.  Stopped Claritin.  Denies fever or other symptoms.  Wants to know if there is anything else she can try OTC.  Informed patient she can try plain Mucinex (without decongestant) to help with congestion.  Also, informed patient to drink plenty of water with Mucinex.  Patient will try Mucinex and call back if not better.    Gaylene Brooks, RN

## 2013-01-05 ENCOUNTER — Telehealth: Payer: Self-pay | Admitting: Family Medicine

## 2013-01-05 NOTE — Telephone Encounter (Signed)
Called Treazure back to clarify.  She was taking acyclovir prophylacticly (as she has a history of one mild shingles outbreak) because she is getting ready to have eye surgery.  She thinks she took a medication when she had the singles but cannot remember if it was the same medication or not.  When the diarrhea started she called her eye doctor the next morning who said to stop the acyclovir, and now she is much better.    - I added acyclovir to allergy list - Discussed patient should get Zostavax especially since she cannot take this medication, we will talk about this at her next visit in early March.

## 2013-01-05 NOTE — Telephone Encounter (Signed)
Patient is calling asking to speak to Rebecca Walter about her Wellness Visit. She knew that Rebecca Walter was on Maternity leave and she has called and left messages for her, but hasn't heard back.  But after talking to her more, I determined that this was a message for Dr. Lula Walter.  She had severe diarrhea and was told one of her doctors by to stop taking some of her medications and it has now resolved.  She will be going in for Cataract Surgery on 1/27 and this call was just to keep Dr. Lula Walter up to date

## 2013-01-12 ENCOUNTER — Ambulatory Visit (INDEPENDENT_AMBULATORY_CARE_PROVIDER_SITE_OTHER): Payer: Medicare Other | Admitting: Family Medicine

## 2013-01-12 ENCOUNTER — Encounter: Payer: Self-pay | Admitting: Family Medicine

## 2013-01-12 ENCOUNTER — Telehealth: Payer: Self-pay | Admitting: Family Medicine

## 2013-01-12 VITALS — BP 151/74 | HR 90 | Temp 98.6°F | Ht 60.0 in | Wt 188.0 lb

## 2013-01-12 DIAGNOSIS — R197 Diarrhea, unspecified: Secondary | ICD-10-CM

## 2013-01-12 DIAGNOSIS — F411 Generalized anxiety disorder: Secondary | ICD-10-CM

## 2013-01-12 DIAGNOSIS — K3184 Gastroparesis: Secondary | ICD-10-CM

## 2013-01-12 DIAGNOSIS — G4700 Insomnia, unspecified: Secondary | ICD-10-CM

## 2013-01-12 MED ORDER — LORAZEPAM 1 MG PO TABS: 1.0000 mg | ORAL_TABLET | Freq: Every evening | ORAL | Status: DC | PRN

## 2013-01-12 NOTE — Assessment & Plan Note (Signed)
Had an episode last week when she started acyclovir, which may have been the cause of it. Yesterday had diarrhea after taking reglan, a stool softener, after a high-carb, high-fat meal.  No fevers/chills/blood or mucous in stool.  I advised to be sure to stay away from high fat foods and high sugar foods.  Will monitor, she will return to care sooner if diarrhea continues, otherwise f/u in 6 weeks.

## 2013-01-12 NOTE — Progress Notes (Signed)
  Subjective:    Patient ID: Rebecca Walter, female    DOB: [birth year], 72 y.o.   MRN: [id number]  HPI  Rebecca Walter comes in for follow up.  She has been very anxious lately and is having some difficulty.   She was put on acyclovir by her eye doctor because she is about to have cataract surgery and has a history of singles.  She started having severe diarrhea, so they stopped the medication.  However, Rebecca Walter also called her pharmacist who suggested that Reglan could cause diarrhea.   She stopped taking Reglan that she takes for her gastroparesis as well, and was OK from Sunday to Thursday night.  She says last night (thursday night) she at [employer] and then had significant nausea and upset stomach.  She took a Reglan and a stool softener, and then had another episode of loose stool.  She has not had fevers or chills, no blood or mucous in the diarrhea.   She has also been having more anxiety because of the upcoming eye surgery and social stressors.  She had a panic attack Monday night.  She is afraid she will run out of her lorazepam early before the next one is due to be filled, and this makes her even more anxious.   Past Medical History  Diagnosis Date  . Diabetes mellitus   . Hypertension   . Anxiety   . Gout   . Gastroparesis due to DM   . Hyperlipidemia   . Osteopenia   . Arthritis   . GERD (gastroesophageal reflux disease)    Family History  Problem Relation Age of Onset  . Stroke Mother   . Heart disease Father   . Stroke Father   . Hypertension Father   . Cancer Sister   . Cancer Brother   . Heart disease Brother   . Diabetes Maternal Uncle    History  Substance Use Topics  . Smoking status: Never Smoker   . Smokeless tobacco: Never Used  . Alcohol Use: No   Review of Systems See HPI    Objective:   Physical Exam BP 151/74  Pulse 90  Temp 98.6 F (37 C) (Oral)  Ht 5' (1.524 m)  Wt 188 lb (85.276 kg)  BMI 36.72 kg/m2  SpO2 96% General appearance: alert, cooperative  and no distress Lungs: clear to auscultation bilaterally Heart: 2/6 systolic murmur, unchanged from previous, RRR Extremities: extremities normal, atraumatic, no cyanosis or edema Pulses: 2+ and symmetric      Assessment & Plan:

## 2013-01-12 NOTE — Assessment & Plan Note (Signed)
Patient has been on Reglan TID without difficulty, I do not think this medication is likely the cause of her diarrhea, and I suspect her gastroparesis and therefore  nausea will become more frequent since stopping it.  However, she is nervous about re-starting it, so will advise to use it as needed for nausea right now, but may have to take it scheduled again if she does not tolerate being off it.

## 2013-01-12 NOTE — Telephone Encounter (Signed)
Pt was given script for Lorazepam and they will not fill without doctor calling to say it's OK

## 2013-01-12 NOTE — Patient Instructions (Signed)
I am sorry you have not been feeling well.  I think you should take the metoclopramide as needed if you are nauseated, but if your nausea becomes frequent and you are not having diarrhea, you can go back to taking it three times a day with meals.  Please come back and see me around March 5th for your next check up.

## 2013-01-12 NOTE — Assessment & Plan Note (Signed)
Worsened due to upcoming eye surgery, Rx for 5 extra Lorazepam (pt normally only takes 30/month).  F/U in 6 weeks.

## 2013-01-12 NOTE — Telephone Encounter (Signed)
Called pharmacy and ok'd.  Pt informed. Rebecca Walter, [name]

## 2013-01-22 ENCOUNTER — Other Ambulatory Visit: Payer: Self-pay | Admitting: Family Medicine

## 2013-01-30 ENCOUNTER — Telehealth: Payer: Self-pay | Admitting: Family Medicine

## 2013-01-30 NOTE — Telephone Encounter (Signed)
Return call to pt after receiving several voice messages for health coach. Pt stated she had cataract surgery 01/15/13 which was successful. 2nd cataract surgery scheduled for 02/05/13 with Dr Luciana Axe. Pt reported "they took very good care of her" and was not too anxious about 2nd surgery. Self reported BP 122/60.

## 2013-02-13 ENCOUNTER — Other Ambulatory Visit: Payer: Self-pay | Admitting: Family Medicine

## 2013-02-19 ENCOUNTER — Encounter: Payer: Self-pay | Admitting: Family Medicine

## 2013-02-19 ENCOUNTER — Ambulatory Visit (INDEPENDENT_AMBULATORY_CARE_PROVIDER_SITE_OTHER): Payer: Medicare Other | Admitting: Family Medicine

## 2013-02-19 VITALS — BP 153/60 | HR 90 | Temp 100.0°F | Ht 60.0 in | Wt 188.0 lb

## 2013-02-19 DIAGNOSIS — E119 Type 2 diabetes mellitus without complications: Secondary | ICD-10-CM

## 2013-02-19 DIAGNOSIS — J069 Acute upper respiratory infection, unspecified: Secondary | ICD-10-CM

## 2013-02-19 LAB — POCT GLYCOSYLATED HEMOGLOBIN (HGB A1C): Hemoglobin A1C: 6.9

## 2013-02-19 MED ORDER — LOSARTAN POTASSIUM 50 MG PO TABS: 50.0000 mg | ORAL_TABLET | Freq: Every day | ORAL | Status: DC

## 2013-02-19 NOTE — Assessment & Plan Note (Signed)
Reassured patient- lungs clear, pulse ox normal, no signs of PNA.  I will change benazepril to losartan at her request.  Discussed using netti pot and other symptomatic care.

## 2013-02-19 NOTE — Patient Instructions (Addendum)
It was good to see you.  I think you have a cold- please use the Netti pot daily, and try the nasonex to help unclog your nose.  Continue to drink lots of fluids and get plenty of rest.   I think your cold is causing your cough, but we will change your Benazepril to Losartan just in case it may be contributing to the cough.   If you are able, please make an appointment on Thursday morning 3/6 or 3/13 in Geriatrics clinic.  If not, please make an appointment to see me in the next 2 weeks.

## 2013-02-19 NOTE — Progress Notes (Signed)
  Subjective:    Patient ID: Rebecca Walter, female    DOB: 05-Dec-1941, 72 y.o.   MRN: [id number]  HPI  Tomia comes in for a same day visit for a cough that has been going on for one week.  She also has a runny nose and sore throat.  She says she may have felt feverish when this first started but she has not taken her temperature.  She is coughing up white phlegm.  She used a netti pot once which helped.  She has not been using her nasonex.  She is worried that her benazapril (that she has been on for a long time) is causing her cough, despite the fact that she has only had a cough for a few days.   Review of Systems Pertinent items in HPI     Objective:   Physical Exam BP 153/60  Pulse 90  Temp(Src) 100 F (37.8 C) (Oral)  Ht 5' (1.524 m)  Wt 188 lb (85.276 kg)  BMI 36.72 kg/m2  SpO2 98% General appearance: alert, cooperative and no distress Ears: normal TM's and external ear canals both ears Nose: clear discharge Throat: lips, mucosa, and tongue normal; teeth and gums normal Lungs: clear to auscultation bilaterally Heart: regular rate and rhythm, S1, S2 normal, no murmur, click, rub or gallop Pulses: 2+ and symmetric      Assessment & Plan:

## 2013-02-22 ENCOUNTER — Encounter: Payer: Self-pay | Admitting: Family Medicine

## 2013-02-22 ENCOUNTER — Ambulatory Visit (INDEPENDENT_AMBULATORY_CARE_PROVIDER_SITE_OTHER): Payer: Medicare Other | Admitting: Family Medicine

## 2013-02-22 VITALS — BP 165/80 | HR 90 | Ht 60.0 in | Wt 189.0 lb

## 2013-02-22 DIAGNOSIS — I10 Essential (primary) hypertension: Secondary | ICD-10-CM

## 2013-02-22 DIAGNOSIS — E1165 Type 2 diabetes mellitus with hyperglycemia: Secondary | ICD-10-CM

## 2013-02-22 DIAGNOSIS — IMO0002 Reserved for concepts with insufficient information to code with codable children: Secondary | ICD-10-CM

## 2013-02-22 DIAGNOSIS — Z9181 History of falling: Secondary | ICD-10-CM

## 2013-02-22 DIAGNOSIS — G4700 Insomnia, unspecified: Secondary | ICD-10-CM

## 2013-02-22 MED ORDER — LORAZEPAM 1 MG PO TABS: 1.0000 mg | ORAL_TABLET | Freq: Every evening | ORAL | Status: DC | PRN

## 2013-02-22 NOTE — Assessment & Plan Note (Signed)
Gait assessment done both with and without cane.  Gait is symmetric and strength appears normal.  She does identify left side as weaker, walks with cane in right.  Cane adjusted to appropriate hight today.

## 2013-02-22 NOTE — Assessment & Plan Note (Signed)
Well-controlled, continue current medications

## 2013-02-22 NOTE — Patient Instructions (Signed)
I am sorry you are not feeling well.  You have a virus, or common cold.  Please continue to use nasal saline rinses, drink plenty of fluids, use nasonex.  The virus has to run it's course, there are no medications that will make it go away faster.   It was good to see you today.  Your Hemoglobin A1C is  Lab Results  Component Value Date   HGBA1C 6.9 02/19/2013  .  Remember your goal for A1C is less than 7.  Your goal for fasting morning blood sugar is 80-120.    Your blood pressure today was BP: 165/80 mmHg.  Remember your goal blood pressure is about 140/90.  Please get the Losartan filled, and stop taking the benazepril.  Please start checking your blood pressure daily and keeping a record of it.  Rosalita Chessman will be calling you to make sure you are doing OK.  If your blood pressure continues to run high we will discuss increasing your medications.  If it improves, please plan on an office visit in about 3 months.

## 2013-02-22 NOTE — Assessment & Plan Note (Signed)
Elevated today, will have her make change from benazepril to losartan, and start daily BP checks again.  Will have health coach follow up with her on BP control, if remains elevated will plan on increase in losartan.

## 2013-02-22 NOTE — Progress Notes (Signed)
Subjective:    Patient ID: Rebecca Walter, female    DOB: 1941-01-01, 72 y.o.   MRN: [id number]  HPI:  Rebecca Walter comes in to Geriatrics clinic for follow up.    URI- seen on Monday, says she has been drinking green tea and is using nasal saline, using nasonex.  She says the cough is better but she felt much worse on Tuesday.  She has not had a fever.  Main complaint now is nasal congestion.   DM: Patient is taking glipizide, lantus, novolog .  Patient is checking blood sugars.  Fasting sugars range from 100 to 200.  She says they have been a little high with current illness.  No polyuria or polydipsia.  HTN: Taking benazepril, HCTZ, Norvasc.  I wrote Rx to switch benazepril to losartan on Monday due to cough and she has not had a chance to pick that up so she is still taking benazepril.  She still wants to change to losartan even thought the cough is gone.  Denies chest pain, dizziness, palpitations, LE edema.  Patient os check blood pressure, they range from 148/64 to 160/80.   [employer] Independent Needs Assistance Dependent  Cooking yes    Housework yes    Manage Medications yes    Manage the telephone yes    Shopping for food, clothes, Meds, etc yes    Use transportation yes    Manage Finances yes     Falls: Had a fall in 2009, started using a cane then.  No falls since then.  Uses can most of the time, says she walks a little faster with it.    Past Medical History  Diagnosis Date  . Diabetes mellitus   . Hypertension   . Anxiety   . Gout   . Gastroparesis due to DM   . Hyperlipidemia   . Osteopenia   . Arthritis   . GERD (gastroesophageal reflux disease)     History  Substance Use Topics  . Smoking status: Never Smoker   . Smokeless tobacco: Never Used  . Alcohol Use: No    Family History  Problem Relation Age of Onset  . Stroke Mother   . Heart disease Father   . Stroke Father   . Hypertension Father   . Cancer Sister   . Cancer Brother   . Heart disease Brother    . Diabetes Maternal Uncle      ROS Pertinent items in HPI    Objective:  Physical Exam:  BP 165/80  Pulse 90  Ht 5' (1.524 m)  Wt 189 lb (85.73 kg)  BMI 36.91 kg/m2 Walking Pulse ox 95% on room air General appearance: alert, cooperative and no distress HEENT: PERRL, TM's normal no effusion, Nose with mild congestion, throat without erythema or exudates.  Head: Normocephalic, without obvious abnormality, atraumatic Lungs: clear to auscultation bilaterally Heart: regular rate and rhythm, soft 2/6 stable murmur Pulses: 2+ and symmetric     Balance Abnormal Patient value  Sitting balance  good  Arise  good  Attempts to arise  N/A stood on first try  Immediate standing balance  good  Standing balance  good  Nudge  good  Eyes closed  good  360 degree turn  good  Sitting down  Good    Gait Abnormal Patient value  Initiation of gait  good  Step length-left  normal  Step length-right  normal  Step height-left  normal  Step height-right  normal  Step symmetry  good  Step continuity  good  Path  straight  Trunk  symmetric  Walking stance  normal        Assessment & Plan:

## 2013-02-23 ENCOUNTER — Telehealth: Payer: Self-pay | Admitting: Family Medicine

## 2013-02-23 NOTE — Telephone Encounter (Signed)
[facility] After Hours Line:  Blood sugar up to 250 yesterday evening. URI this past week. She is concerned because not used to seeing numbers like this with a1c of 6.9. Assured patient that this is common when people are sick for #s to be higher. She operates on a sliding scale with novolog up to 15 and took 10 last night when usually only takes 8. Todl patient she can continue to use slight increased doses while sick. Follow up early next week if still having issues.

## 2013-02-26 ENCOUNTER — Telehealth: Payer: Self-pay | Admitting: Home Health Services

## 2013-02-26 NOTE — Telephone Encounter (Signed)
Spoke with Rebecca Walter.  Pt had question about new dose for Losartan at 50 mg.  After consulting with Dr. Lula Olszewski (PCP), I called pt back and informed her that was the correct dose and she could start taking it.  Pt reported no current problems with blood pressure and informed her to contact us if she had any further questions or problems arise.  Pt reported starting to get over cold and was able to get some light exercise done at home.

## 2013-03-02 ENCOUNTER — Telehealth: Payer: Self-pay | Admitting: Family Medicine

## 2013-03-02 ENCOUNTER — Encounter: Payer: Self-pay | Admitting: *Deleted

## 2013-03-02 MED ORDER — PANTOPRAZOLE SODIUM 40 MG PO TBEC: 40.0000 mg | DELAYED_RELEASE_TABLET | Freq: Every day | ORAL | Status: DC

## 2013-03-02 NOTE — Telephone Encounter (Signed)
Patient has been calling [facility] for a refill on her generic Protonix for 3 days, which I do not see, but she needs this medication today because she doesn't have enough for the weekend.  [facility] [address].

## 2013-03-02 NOTE — Telephone Encounter (Signed)
Rx sent in

## 2013-03-06 ENCOUNTER — Telehealth: Payer: Self-pay | Admitting: Home Health Services

## 2013-03-06 NOTE — Telephone Encounter (Signed)
Spoke with Fallynn.  Pt reports feeling fine today.  Pt taking medications as prescribed.  Blood pressure yesterday was 147/72, fasting blood sugar this morning 113.    Pt reported having an anxiety attack this weekend but is much better now.   Will follow up next week with patient on dm/bp management.

## 2013-03-08 ENCOUNTER — Other Ambulatory Visit: Payer: Self-pay | Admitting: Family Medicine

## 2013-03-12 NOTE — Telephone Encounter (Signed)
This encounter was created in error - please disregard.

## 2013-03-14 ENCOUNTER — Telehealth: Payer: Self-pay | Admitting: Home Health Services

## 2013-03-14 NOTE — Telephone Encounter (Signed)
Spoke with Jasmia.  Pt reports taking 40mg  of lovastatin daily.   Pt reported blood pressure today at 158/72; 162/74.  Blood sugar at 208.  Pt going to Dr. Luciana Axe today and has cleared her eyes for another 6 months.  Pt reports not walking for past few weeks.  Pt report not having a tight control on diet in relation to foods that may increase her blood sugar.   Pt set goal to limit crackers, bread, and other foods that may raise her blood sugar this week.  Pt set goal to start talking walks once the weather gets better.  Pt's overall goal is weight loss, dm management, bp management.

## 2013-03-15 ENCOUNTER — Other Ambulatory Visit: Payer: Self-pay | Admitting: Family Medicine

## 2013-03-22 ENCOUNTER — Telehealth: Payer: Self-pay | Admitting: Home Health Services

## 2013-03-22 NOTE — Telephone Encounter (Signed)
Spoke with patient.  Pt reports taking medications as prescribed. BG range: 137-160 BP: 155/72  Pt reports walking daily again.  Pt's overall goal is weight loss, dm/bp management.

## 2013-03-26 ENCOUNTER — Telehealth: Payer: Self-pay | Admitting: Family Medicine

## 2013-03-26 NOTE — Telephone Encounter (Signed)
Pt has been on Losartin for a few weeks and is needing to talk to nurse about her side affects of her breathing - also had acid reflux on Saturday

## 2013-03-26 NOTE — Telephone Encounter (Signed)
Spoke with Lynzy.  Pt reports breathing is better.  Informed patient that if breathing every become difficult to go to emergency room.  Pt spoke with pharmacist and believes her breathing difficulty is a side effect of Losartan.  Pt would like to stop taking Losartan but will wait to hear from PCP before stopping.  Pt reported acid reflux is better from this weekend.   Pt reported eating fried fish this weekend and believes that is what cause the reflux.  Please let patient know if she can stop taking Losartan.

## 2013-03-27 NOTE — Telephone Encounter (Signed)
Spoke with Rebecca Walter.  Pt reports feeling well. Has not had problems with breathing.  Informed patient that Dr. Lula Olszewski would not be back until next week to discuss stopping losartan.  Pt expressed understanding and will continue to take losartan until she hears from PCP.

## 2013-04-02 ENCOUNTER — Telehealth: Payer: Self-pay | Admitting: Home Health Services

## 2013-04-02 NOTE — Telephone Encounter (Signed)
Spoke with Mailin.  Pt requested changing losartan to benazepril because she feels the losartan give her shortness of breath.  Per verbal from Dr. Lula Olszewski, informed pt that she can switch medication but she must not be taking both. Pt expressed understanding and will stop taking losartan tomorrow 4/15 and start benazepril.

## 2013-04-07 ENCOUNTER — Other Ambulatory Visit: Payer: Self-pay | Admitting: Family Medicine

## 2013-04-12 ENCOUNTER — Telehealth: Payer: Self-pay | Admitting: Home Health Services

## 2013-04-12 NOTE — Telephone Encounter (Signed)
Spoke with Rebecca Walter Pt reports feeling: good Pt reports taking medications yes Patient missed taking medications 0 days this week.   Pt is self monitoring their DM:yes medication compliance: compliant all of the time, diabetic diet compliance: compliant most of the time, home glucose monitoring: is performed regularly, fasting values in normal range  Pt is self monitoring their HTN:no not doing  Last weeks goals:self monitoring bg, bp.  Start walking again. Pt was successful with last week's goals: yes This weeks goals: continue monitoring bp, bg and to walk 5 days a week. Limit carbohydrates in diet. Pt's overall goal is: Dm/Bp management

## 2013-04-25 ENCOUNTER — Ambulatory Visit (INDEPENDENT_AMBULATORY_CARE_PROVIDER_SITE_OTHER): Payer: Medicare Other | Admitting: Family Medicine

## 2013-04-25 VITALS — BP 140/64 | HR 80 | Temp 98.3°F | Ht 60.0 in | Wt 188.0 lb

## 2013-04-25 DIAGNOSIS — R350 Frequency of micturition: Secondary | ICD-10-CM

## 2013-04-25 DIAGNOSIS — I10 Essential (primary) hypertension: Secondary | ICD-10-CM

## 2013-04-25 DIAGNOSIS — J309 Allergic rhinitis, unspecified: Secondary | ICD-10-CM

## 2013-04-25 NOTE — Assessment & Plan Note (Signed)
Discussed with patient that she has been in with frequency multiple times and had normal UA's.  Discussed that she likely has overactive bladder vs.  IC.  Advised kegel exercises, gave hand out.

## 2013-04-25 NOTE — Assessment & Plan Note (Signed)
Discussed importance of taking antihistamine and nasal steroid every day, also suggested nasal saline rinses.

## 2013-04-25 NOTE — Progress Notes (Signed)
  Subjective:    Patient ID: Rebecca Walter, female    DOB: 10-07-1941, 72 y.o.   MRN: [id number]  HPI  Rebecca Walter comes in for follow up:   HTN: taking amlodipine, benazepril, hctz, demadex.  Says she tried the losartan but she could not get her blood pressure under 150 systolic, and so she decided the benazepril had not caused her cough and that it must have just been a virus and went back to taking the benazepril. She endorses one episode of dizziness, but says she checked her blood pressure and it was 150/69, and her blood sugar was 90.   Urine color: She says her urine has been yellow, and normally it is more clear.  She endorses some frequency, but denies pain, nausea, fever, back pain.  She had to go to the bathroom multiple times on Friday and was just not feeling well, but that has gotten better.   Seasonal allergies: Taking claratin, takes nasonex some days.  Has nasal congestion, but no fevers, no headaches, just feels stuffed up.    Past Medical History  Diagnosis Date  . Diabetes mellitus   . Hypertension   . Anxiety   . Gout   . Gastroparesis due to DM   . Hyperlipidemia   . Osteopenia   . Arthritis   . GERD (gastroesophageal reflux disease)    Family History  Problem Relation Age of Onset  . Stroke Mother   . Heart disease Father   . Stroke Father   . Hypertension Father   . Cancer Sister   . Cancer Brother   . Heart disease Brother   . Diabetes Maternal Uncle    History  Substance Use Topics  . Smoking status: Never Smoker   . Smokeless tobacco: Never Used  . Alcohol Use: No   Review of Systems See HPI    Objective:   Physical Exam BP 140/64  Pulse 80  Temp(Src) 98.3 F (36.8 C) (Oral)  Ht 5' (1.524 m)  Wt 188 lb (85.276 kg)  BMI 36.72 kg/m2 General appearance: alert, cooperative and no distress Nose: mild congestion Throat: lips, mucosa, and tongue normal; teeth and gums normal Lungs: clear to auscultation bilaterally Heart: stable 2/6 systolic  murmur, RRR Abdomen: soft, non-tender; bowel sounds normal; no masses,  no organomegaly Extremities: extremities normal, atraumatic, no cyanosis or edema     Assessment & Plan:

## 2013-04-25 NOTE — Assessment & Plan Note (Signed)
Well controlled on 3 medications, no changes.

## 2013-04-25 NOTE — Patient Instructions (Signed)
It was good to see you.  Please tell your gastroenterologist that my records say your last colonoscopy was in 2003, so you should be due for one.   I do not think you have a bladder infection- you have been here with similar symptoms many times and I suspect you have an overactive bladder. Please read below.   Overactive Bladder, Adult The bladder has two functions that are totally opposite of the other. One is to relax and stretch out so it can store urine (fills like a balloon), and the other is to contract and squeeze down so that it can empty the urine that it has stored. Proper functioning of the bladder is a complex mixing of these two functions. The filling and emptying of the bladder can be influenced by:  The bladder.  The spinal cord.  The brain.  The nerves going to the bladder.  Other organs that are closely related to the bladder such as prostate in males and the vagina in females. As your bladder fills with urine, nerve signals are sent from the bladder to the brain to tell you that you may need to urinate. Normal urination requires that the bladder squeeze down with sufficient strength to empty the bladder, but this also requires that the bladder squeeze down sufficiently long to finish the job. In addition the sphincter muscles, which normally keep you from leaking urine, must also relax so that the urine can pass. Coordination between the bladder muscle squeezing down and the sphincter muscles relaxing is required to make everything happen normally. With an overactive bladder sometimes the muscles of the bladder contract unexpectedly and involuntarily and this causes an urgent need to urinate. The normal response is to try to hold urine in by contracting the sphincter muscles. Sometimes the bladder contracts so strongly that the sphincter muscles cannot stop the urine from passing out and incontinence occurs. This kind of incontinence is called urge incontinence. Having an  overactive bladder can be embarrassing and awkward. It can keep you from living life the way you want to. Many people think it is just something you have to put up with as you grow older or have certain health conditions. In fact, there are treatments that can help make your life easier and more pleasant. CAUSES  Many things can cause an overactive bladder. Possibilities include:  Urinary tract infection or infection of nearby tissues such as the prostate.  Prostate enlargement.  In women, multiple pregnancies or surgery on the uterus or urethra.  Bladder stones, inflammation or tumors.  Caffeine.  Alcohol.  Medications. For example, diuretics (drugs that help the body get rid of extra fluid) increase urine production. Some other medicines must be taken with lots of fluids.  Muscle or nerve weakness. This might be the result of a spinal cord injury, a stroke, multiple sclerosis or Parkinson's disease.  Diabetes can cause a high urine volume which fills the bladder so quickly that the normal urge to urinate is triggered very strongly. SYMPTOMS   Loss of bladder control. You feel the need to urinate and cannot make your body wait.  Sudden, strong urges to urinate.  Urinating 8 or more times a day.  Waking up to urinate two or more times a night. DIAGNOSIS  To decide if you have overactive bladder, your healthcare provider will probably:  Ask about symptoms you have noticed.  Ask about your overall health. This will include questions about any medications you are taking.  Do a physical  examination. This will help determine if there are obvious blockages or other problems.  Order some tests. These might include:  A blood test to check for diabetes or other health issues that could be contributing to the problem.  Urine testing. This could measure the flow of urine and the pressure on the bladder.  A test of your neurological system (the brain, spinal cord and nerves). This is  the system that senses the need to urinate. Some of these tests are called flow tests, bladder pressure tests and electrical measurements of the sphincter muscle.  A bladder test to check whether it is emptying completely when you urinate.  Cytoscopy. This test uses a thin tube with a tiny camera on it. It offers a look inside your urethra and bladder to see if there are problems.  Imaging tests. You might be given a contrast dye and then asked to urinate. X-rays are taken to see how your bladder is working. TREATMENT  An overactive bladder can be treated in many ways. The treatment will depend on the cause. Whether you have a mild or severe case also makes a difference. Often, treatment can be given in your healthcare provider's office or clinic. Be sure to discuss the different options with your caregiver. They include:  Behavioral treatments. These do not involve medication or surgery:  Bladder training. For this, you would follow a schedule to urinate at regular intervals. This helps you learn to control the urge to urinate. At first, you might be asked to wait a few minutes after feeling the urge. In time, you should be able to schedule bathroom visits an hour or more apart.  Kegel exercises. These exercises strengthen the pelvic floor muscles, which support the bladder. By toning these muscles, they can help control urination, even if the bladder muscles are overactive. A specialist will teach you how to do these exercises correctly. They will require daily practice.  Weight loss. If you are obese or overweight, losing weight might stop your bladder from being overactive. Talk to your healthcare provider about how many pounds you should lose. Also ask if there is a specific program or method that would work best for you.  Diet change. This might be suggested if constipation is making your overactive bladder worse. Your healthcare provider or a nutritionist can explain ways to change what you  eat to ease constipation. Other people might need to take in less caffeine or alcohol. Sometimes drinking fewer fluids is needed, too.  Protection. This is not an actual treatment. But, you could wear special pads to take care of any leakage while you wait for other treatments to take effect. This will help you avoid embarrassment.  Physical treatments.  Electrical stimulation. Electrodes will send gentle pulses to the nerves or muscles that help control the bladder. The goal is to strengthen them. Sometimes this is done with the electrodes outside of the body. Or, they might be placed inside the body (implanted). This treatment can take several months to have an effect.  Medications. These are usually used along with other treatments. Several medicines are available. Some are injected into the muscles involved in urination. Others come in pill form. Medications sometimes prescribed include:  Anticholinergics. These drugs block the signals that the nerves deliver to the bladder. This keeps it from releasing urine at the wrong time. Researchers think the drugs might help in other ways, too.  Imipramine. This is an antidepressant. But, it relaxes bladder muscles.  Botox. This is  still experimental. Some people believe that injecting it into the bladder muscles will relax them so they work more normally. It has also been injected into the sphincter muscle when the sphincter muscle does not open properly. This is a temporary fix, however. Also, it might make matters worse, especially in older people.  Surgery.  A device might be implanted to help manage your nerves. It works on the nerves that signal when you need to urinate.  Surgery is sometimes needed with electrical stimulation. If the electrodes are implanted, this is done through surgery.  Sometimes repairs need to be made through surgery. For example, the size of the bladder can be changed. This is usually done in severe cases only. HOME  CARE INSTRUCTIONS   Take any medications your healthcare provider prescribed or suggested. Follow the directions carefully.  Practice any lifestyle changes that are recommended. These might include:  Drinking less fluid or drinking at different times of the day. If you need to urinate often during the night, for example, you may need to stop drinking fluids early in the evening.  Cutting down on caffeine or alcohol. They can both make an overactive bladder worse. Caffeine is found in coffee, tea and sodas.  Doing Kegel exercises to strengthen muscles.  Losing weight, if that is recommended.  Eating a healthy and balanced diet. This will help you avoid constipation.  Keep a journal or a log. You might be asked to record how much you drink and when, and also when you feel the need to urinate.  Learn how to care for implants or other devices, such as pessaries. SEEK MEDICAL CARE IF:   Your overactive bladder gets worse.  You feel increased pain or irritation when you urinate.  You notice blood in your urine.  You have questions about any medications or devices that your healthcare provider recommended.  You notice blood, pus or swelling at the site of any test or treatment procedure.  You have an oral temperature above 102 F (38.9 C). SEEK IMMEDIATE MEDICAL CARE IF:  You have an oral temperature above 102 F (38.9 C), not controlled by medicine. Document Released: 10/02/2009 Document Revised: 02/28/2012 Document Reviewed: 10/02/2009 [facility] Patient Information 2013 [place], [place].

## 2013-05-03 ENCOUNTER — Telehealth: Payer: Self-pay | Admitting: Home Health Services

## 2013-05-03 NOTE — Telephone Encounter (Signed)
Spoke with Rebecca Walter Pt reports feeling: good Pt reports taking medications yes Patient missed taking medications 0 days this week.   Pt is self monitoring their DM:yes medication compliance: compliant all of the time, diabetic diet compliance: compliant most of the time, home glucose monitoring: is performed regularly  Pt is self monitoring their HTN:no not doing  Last weeks goals:limit fried foods, walk daily. Pt was successful with last week's goals: no This weeks goals: limit fried foods, walk daily Pt's overall goal is: dm/htn management, weight loss

## 2013-05-07 ENCOUNTER — Other Ambulatory Visit: Payer: Self-pay | Admitting: Gastroenterology

## 2013-05-07 DIAGNOSIS — K224 Dyskinesia of esophagus: Secondary | ICD-10-CM

## 2013-05-09 ENCOUNTER — Ambulatory Visit
Admission: RE | Admit: 2013-05-09 | Discharge: 2013-05-09 | Disposition: A | Discharge status: Home / Self Care | Payer: Medicare Other | Source: Ambulatory Visit | Attending: Gastroenterology | Admitting: Gastroenterology

## 2013-05-09 DIAGNOSIS — K224 Dyskinesia of esophagus: Secondary | ICD-10-CM

## 2013-05-11 ENCOUNTER — Ambulatory Visit (INDEPENDENT_AMBULATORY_CARE_PROVIDER_SITE_OTHER): Payer: Medicare Other | Admitting: Family Medicine

## 2013-05-11 ENCOUNTER — Encounter: Payer: Self-pay | Admitting: Family Medicine

## 2013-05-11 VITALS — BP 155/78 | HR 79 | Ht 60.0 in | Wt 188.0 lb

## 2013-05-11 DIAGNOSIS — N611 Abscess of the breast and nipple: Secondary | ICD-10-CM | POA: Insufficient documentation

## 2013-05-11 DIAGNOSIS — L02239 Carbuncle of trunk, unspecified: Secondary | ICD-10-CM

## 2013-05-11 MED ORDER — DOXYCYCLINE HYCLATE 100 MG PO CAPS: 100.0000 mg | ORAL_CAPSULE | Freq: Two times a day (BID) | ORAL | Status: DC

## 2013-05-11 NOTE — Patient Instructions (Addendum)
Abscess An abscess is an infected area that contains a collection of pus and debris.It can occur in almost any part of the body. An abscess is also known as a furuncle or boil. CAUSES  An abscess occurs when tissue gets infected. This can occur from blockage of oil or sweat glands, infection of hair follicles, or a minor injury to the skin. As the body tries to fight the infection, pus collects in the area and creates pressure under the skin. This pressure causes pain. People with weakened immune systems have difficulty fighting infections and get certain abscesses more often.  SYMPTOMS Usually an abscess develops on the skin and becomes a painful mass that is red, warm, and tender. If the abscess forms under the skin, you may feel a moveable soft area under the skin. Some abscesses break open (rupture) on their own, but most will continue to get worse without care. The infection can spread deeper into the body and eventually into the bloodstream, causing you to feel ill.  DIAGNOSIS  Your caregiver will take your medical history and perform a physical exam. A sample of fluid may also be taken from the abscess to determine what is causing your infection. TREATMENT  Your caregiver may prescribe antibiotic medicines to fight the infection. However, taking antibiotics alone usually does not cure an abscess. Your caregiver may need to make a small cut (incision) in the abscess to drain the pus. In some cases, gauze is packed into the abscess to reduce pain and to continue draining the area. HOME CARE INSTRUCTIONS   Only take over-the-counter or prescription medicines for pain, discomfort, or fever as directed by your caregiver.  If you were prescribed antibiotics, take them as directed. Finish them even if you start to feel better.  If gauze is used, follow your caregiver's directions for changing the gauze.  To avoid spreading the infection:  Keep your draining abscess covered with a  bandage.  Wash your hands well.  Do not share personal care items, towels, or whirlpools with others.  Avoid skin contact with others.  Keep your skin and clothes clean around the abscess.  Keep all follow-up appointments as directed by your caregiver. SEEK MEDICAL CARE IF:   You have increased pain, swelling, redness, fluid drainage, or bleeding.  You have muscle aches, chills, or a general ill feeling.  You have a fever. MAKE SURE YOU:   Understand these instructions.  Will watch your condition.  Will get help right away if you are not doing well or get worse. Document Released: 09/15/2005 Document Revised: 06/06/2012 Document Reviewed: 02/18/2012 [employer] Patient Information 2014 [employer].  

## 2013-05-11 NOTE — Progress Notes (Signed)
  Subjective:    Patient ID: Rebecca Walter, female    DOB: [birth year], 72 y.o.   MRN: [id number]  HPI  Had mammogram which was normal in 10/13.  Noticed red area on breast yesterday.  Notes some mild tenderness.  No fever, chills.   Review of Systems  Constitutional: Negative for fever and chills.  Respiratory: Negative for chest tightness and shortness of breath.   Gastrointestinal: Negative for abdominal pain.       Objective:   Physical Exam  Vitals reviewed. Constitutional: She appears well-developed and well-nourished.  HENT:  Head: Normocephalic and atraumatic.  Eyes: No scleral icterus.  Neck: Neck supple.  Cardiovascular: Normal rate.   Pulmonary/Chest: Effort normal.            Assessment & Plan:

## 2013-05-11 NOTE — Assessment & Plan Note (Signed)
Suspect boil given erythema.  Trial of Doxycycline.  If no improvement in one week-send for imaging.

## 2013-05-21 ENCOUNTER — Other Ambulatory Visit: Payer: Self-pay | Admitting: Family Medicine

## 2013-05-24 ENCOUNTER — Telehealth: Payer: Self-pay | Admitting: Home Health Services

## 2013-05-24 NOTE — Telephone Encounter (Signed)
Spoke with Ebonye Pt reports feeling: good Pt reports taking medications yes Patient missed taking medications 0 days this week.   Pt is self monitoring their DM:yes medication compliance: compliant all of the time  Pt is self monitoring their HTN:yes can't remember  Last weeks goals:start to walk Pt was successful with last week's goals: yes This weeks goals: continue walking 2-3 x a week Pt's overall goal is: dm/htn managment

## 2013-05-27 ENCOUNTER — Other Ambulatory Visit: Payer: Self-pay | Admitting: Family Medicine

## 2013-05-29 ENCOUNTER — Ambulatory Visit (INDEPENDENT_AMBULATORY_CARE_PROVIDER_SITE_OTHER): Payer: Medicare Other | Admitting: Family Medicine

## 2013-05-29 ENCOUNTER — Encounter: Payer: Self-pay | Admitting: Family Medicine

## 2013-05-29 VITALS — BP 151/57 | HR 75 | Ht 60.0 in | Wt 185.0 lb

## 2013-05-29 DIAGNOSIS — F411 Generalized anxiety disorder: Secondary | ICD-10-CM

## 2013-05-29 DIAGNOSIS — IMO0002 Reserved for concepts with insufficient information to code with codable children: Secondary | ICD-10-CM

## 2013-05-29 DIAGNOSIS — I10 Essential (primary) hypertension: Secondary | ICD-10-CM

## 2013-05-29 DIAGNOSIS — G4700 Insomnia, unspecified: Secondary | ICD-10-CM

## 2013-05-29 DIAGNOSIS — K117 Disturbances of salivary secretion: Secondary | ICD-10-CM

## 2013-05-29 DIAGNOSIS — E118 Type 2 diabetes mellitus with unspecified complications: Secondary | ICD-10-CM

## 2013-05-29 DIAGNOSIS — R682 Dry mouth, unspecified: Secondary | ICD-10-CM

## 2013-05-29 LAB — POCT GLYCOSYLATED HEMOGLOBIN (HGB A1C): Hemoglobin A1C: 7

## 2013-05-29 MED ORDER — LORAZEPAM 1 MG PO TABS: 1.0000 mg | ORAL_TABLET | Freq: Every evening | ORAL | Status: DC | PRN

## 2013-05-29 NOTE — Assessment & Plan Note (Signed)
Lorazepam refilled x 3 month supply.

## 2013-05-29 NOTE — Assessment & Plan Note (Signed)
Good control on current regimen, continue. F/U in 3 months.

## 2013-05-29 NOTE — Assessment & Plan Note (Signed)
Likely from medication side effects, advised biotene mouth wash products.

## 2013-05-29 NOTE — Patient Instructions (Signed)
It was good to see you today.  Your A1C is 7.0. Please continue your diabetes medications at your current dose.    Your Blood pressure was 142/60, please continue to monitor it and let us know if it is running high.   For your dry mouth, please try Biotene mouth wash products.

## 2013-05-29 NOTE — Progress Notes (Signed)
  Subjective:    Patient ID: Rebecca Walter, female    DOB: 05/03/1941, 72 y.o.   MRN: [id number]  HPI:  DM: Patient is taking Lantus 55 units daily, and SSI with meals.  Patient is checking blood sugars.  Fasting sugars range from 100 to 150.  No hyper or hypoglycemic episodes, no polyuria or polydipsia.   HTN: Taking Amlodipine, benazepril, hctz without difficulty.  Denies chest pain, dizziness, palpitations, LE edema.  Patient is check blood pressure, they range from 130/60 to 145/70.   Anxeity: Taking ativan, some days none, some days twice a day.  Denies over sedation, confusion, falls. Says ativan helps when she feels panicky.   Dry mouth: complains of feeling like her mouth is "inside out" like she ate a lemon. Also with some nausea and vomiting that is chronic, but worse since dry mouth started.   Past Medical History  Diagnosis Date  . Diabetes mellitus   . Hypertension   . Anxiety   . Gout   . Gastroparesis due to DM   . Hyperlipidemia   . Osteopenia   . Arthritis   . GERD (gastroesophageal reflux disease)     History  Substance Use Topics  . Smoking status: Never Smoker   . Smokeless tobacco: Never Used  . Alcohol Use: No    Family History  Problem Relation Age of Onset  . Stroke Mother   . Heart disease Father   . Stroke Father   . Hypertension Father   . Cancer Sister   . Cancer Brother   . Heart disease Brother   . Diabetes Maternal Uncle      ROS Pertinent items in HPI    Objective:  Physical Exam:  BP 151/57  Pulse 75  Ht 5' (1.524 m)  Wt 185 lb (83.915 kg)  BMI 36.13 kg/m2 General appearance: alert, cooperative and no distress Head: Normocephalic, without obvious abnormality, atraumatic Lungs: clear to auscultation bilaterally Heart: regular rate and rhythm, S1, S2 normal,2/6 systolic murmur Pulses: 2+ and symmetric       Assessment & Plan:

## 2013-05-29 NOTE — Assessment & Plan Note (Signed)
First reading slightly elevated but re-check 142/60.  I have asked her to continue to monitor the blood pressure daily, call if it runs above 150/90.

## 2013-06-04 ENCOUNTER — Other Ambulatory Visit: Payer: Self-pay | Admitting: Family Medicine

## 2013-06-06 NOTE — Telephone Encounter (Signed)
lmovm for patient that meds sent in.  Tea Collums, Darlyne Russian, CMA

## 2013-06-06 NOTE — Telephone Encounter (Signed)
Patient is calling because the refill request for Metformin was sent to Dr. Lula Olszewski on Monday but hasn't been approved yet and the patient is wondering why.  She is quite out yet, but she will soon so she needs that med refilled.

## 2013-06-06 NOTE — Telephone Encounter (Signed)
Will fwd to Md.  Tanina Barb L, CMA  

## 2013-06-14 ENCOUNTER — Telehealth: Payer: Self-pay | Admitting: Home Health Services
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
## 2013-06-21 ENCOUNTER — Other Ambulatory Visit: Payer: Self-pay | Admitting: Family Medicine

## 2013-06-21 NOTE — Telephone Encounter (Signed)
Pt called and left message on MD/pharmacy line.  She wanted to speak to elizabeth.  Will fwd to [place].  Nickayla Mcinnis, Darlyne Russian, CMA

## 2013-06-25 NOTE — Telephone Encounter (Signed)
Refilled colchicine 

## 2013-06-28 ENCOUNTER — Other Ambulatory Visit: Payer: Self-pay

## 2013-07-13 ENCOUNTER — Telehealth: Payer: Self-pay | Admitting: Home Health Services

## 2013-07-13 NOTE — Telephone Encounter (Signed)
Spoke with Rebecca Walter Pt reports feeling: good Pt reports taking medications yes Patient missed taking medications 0 days this week.   Pt is self monitoring their DM:yes medication compliance: compliant all of the time, home glucose monitoring: is performed regularly, fasting values range 140  Pt is self monitoring their HTN:yes today value 140/70   Last weeks goals: start walking again. Pt reported walking in neighborhood 5-6x this past week. Pt was successful with last week's goals: yes This weeks goals: continue walking Pt's overall goal is: dm/bp managment

## 2013-08-01 ENCOUNTER — Other Ambulatory Visit: Payer: Self-pay | Admitting: Family Medicine

## 2013-08-09 ENCOUNTER — Ambulatory Visit (INDEPENDENT_AMBULATORY_CARE_PROVIDER_SITE_OTHER): Payer: Medicare Other | Admitting: Family Medicine

## 2013-08-09 ENCOUNTER — Encounter: Payer: Self-pay | Admitting: Family Medicine

## 2013-08-09 VITALS — BP 147/68 | HR 80 | Temp 98.3°F | Ht 60.0 in | Wt 186.0 lb

## 2013-08-09 DIAGNOSIS — G5701 Lesion of sciatic nerve, right lower limb: Secondary | ICD-10-CM

## 2013-08-09 DIAGNOSIS — T3991XA Poisoning by unspecified nonopioid analgesic, antipyretic and antirheumatic, accidental (unintentional), initial encounter: Secondary | ICD-10-CM

## 2013-08-09 DIAGNOSIS — T39391A Poisoning by other nonsteroidal anti-inflammatory drugs [NSAID], accidental (unintentional), initial encounter: Secondary | ICD-10-CM | POA: Insufficient documentation

## 2013-08-09 DIAGNOSIS — G5700 Lesion of sciatic nerve, unspecified lower limb: Secondary | ICD-10-CM

## 2013-08-09 NOTE — Patient Instructions (Addendum)
Mrs. Zazueta,  Thank you for coming in today.   Regarding the symptoms last week, it may have been from taking too much aleve. I recommend taking no more than 3 tablets in a 24 hr period. If you have recurrent symptoms, especially chest pain please call for follow up if it goes away with rest. If it does not go away with rest, call 911.   Regarding R buttock pain: I suspect piriformis syndrome. Please try ice and stretches provided.   Follow up with Dr. Birdie Sons in 2-3 weeks if you are still having pain as you may require physical therapy.  Dr. Armen Pickup

## 2013-08-09 NOTE — Assessment & Plan Note (Signed)
A: Patient with right piriformis syndrome. P: Ice and stretches- patient given handout from sports medicine patient advisory. Patient advised she can take Aleve as limited to 220 mg every 8 hours or 440 mg x1 and 220 mg 12 hours later = no more than 3 tabs daily. Followup in 2-3 weeks as needed. The patient is still having pain after consistent use of ice, NSAIDs, stretching he may try a short course of prednisone versus referral to physical therapy.

## 2013-08-09 NOTE — Progress Notes (Signed)
Subjective:     Patient ID: Rebecca Walter, female   DOB: [birth year], 72 y.o.   MRN: [id number]  HPI 72 year old female with history of osteo-arthritis presents for same-day visit to discuss the following:  #1 chest tightness: Patient had episode of chest tightness associated with left ear stuffiness and left neck pain that occurred one week ago. This is in the setting of the patient taking Aleve 440 mg every 8 hours for 24 hours for treatment of right gluteal pain. Her symptoms resolved with rest. Symptoms have not recurred. There is no associated shortness of breath, nausea, vomiting, diaphoresis. Patient does not have a history of chest pain.  #2 right gluteal pain: Present for the past 3 weeks. No preceding injury. Pain is worse with hip flexion. Pain is worse when rising from lying down or from sleep sitting. Patient endorses the stiffness that resolves with movement. She reports that the Aleve did help with her pain she is not taking. The pain does not radiate down her leg. She denies fecal/urinary incontinence and lower extremity weakness.  Review of Systems As per HPI     Objective:   Physical Exam BP 147/68  Pulse 80  Temp(Src) 98.3 F (36.8 C) (Oral)  Ht 5' (1.524 m)  Wt 186 lb (84.369 kg)  BMI 36.33 kg/m2 General appearance: alert, cooperative and no distress Back Exam: Back: Normal Curvature, no deformities or CVA tenderness  Paraspinal Tenderness: none   Gluteal tenderness: R side along piriformis. Pain also reproduced with hip flexion.  LE Strength 5/5  Straight leg raise: negative       Assessment and Plan:

## 2013-08-09 NOTE — Assessment & Plan Note (Signed)
A: Patient's symptoms last week are consistent with an NSAID overdose. I suspect a full sensation in her ear with tinnitus. Of course I have considered acute coronary syndrome, but she has no history of chest pain his symptoms have fully resolved.  P: Caution patient against taking too much NSAID. Advised that if she has recurrent symptoms that resolve with rest she is to seek medical attention for further workup. If your symptoms that do not resolve with rest she should call 911.

## 2013-08-13 ENCOUNTER — Telehealth: Payer: Self-pay | Admitting: Home Health Services

## 2013-08-13 ENCOUNTER — Other Ambulatory Visit: Payer: Self-pay | Admitting: Family Medicine

## 2013-08-13 NOTE — Telephone Encounter (Signed)
Spoke with Rebecca Walter.  Pt called to talk about soreness in her back.  This was addressed at last office visit with Dr. Armen Pickup on 8/21.  I reemphasized Dr. Armen Pickup suggestion to try ice and stretching exercises.  Pt then shared that on Friday while she was bringing in groceries she felt exhausted and took an aleve. She said the aleve made her feel different and was afraid it was something with her heart.  She said it passed and it hasn't happened since.  We talked about calling 911 if it occurred again and she felt she was in danger.  We scheduled an OV with PCP on 9/2 to discuss these concerns.

## 2013-08-15 ENCOUNTER — Ambulatory Visit (INDEPENDENT_AMBULATORY_CARE_PROVIDER_SITE_OTHER): Payer: Medicare Other | Admitting: Family Medicine

## 2013-08-15 ENCOUNTER — Encounter: Payer: Self-pay | Admitting: Family Medicine

## 2013-08-15 VITALS — BP 151/77 | HR 85 | Ht 60.0 in | Wt 186.0 lb

## 2013-08-15 DIAGNOSIS — R200 Anesthesia of skin: Secondary | ICD-10-CM

## 2013-08-15 DIAGNOSIS — R209 Unspecified disturbances of skin sensation: Secondary | ICD-10-CM

## 2013-08-15 LAB — BASIC METABOLIC PANEL
CO2: 30 mEq/L (ref 19–32)
Chloride: 101 mEq/L (ref 96–112)
Glucose, Bld: 156 mg/dL — ABNORMAL HIGH (ref 70–99)
Potassium: 4.3 mEq/L (ref 3.5–5.3)
Sodium: 140 mEq/L (ref 135–145)

## 2013-08-15 MED ORDER — CARBAMAZEPINE ER 100 MG PO CP12: 100.0000 mg | ORAL_CAPSULE | Freq: Two times a day (BID) | ORAL | Status: DC

## 2013-08-15 NOTE — Patient Instructions (Addendum)
Nice to meet you. I believe your tingling may be related to trigeminal neuralgia, though concern for TIA is present. We are going to get an MRI of your brain to see if there is a cause. We will also try a medication for this called carbamazepine. Please also only take aspirin 81 mg daily. If you develop numbness, tingling, weakness, change in speech, hearing, or ability to ambulate please call 911 or get to the emergency room as soon as possible.

## 2013-08-16 ENCOUNTER — Telehealth: Payer: Self-pay | Admitting: Family Medicine

## 2013-08-16 NOTE — Telephone Encounter (Signed)
Pt was calling because the MRI appointment that was made at [facility] use a enclosed machine and she is claustrophobic, she would like [place] to schedule another appointment for the [facility] because they use the open machine. JW

## 2013-08-17 NOTE — Progress Notes (Signed)
  Subjective:    Patient ID: Rebecca Walter, female    DOB: 1940/12/27, 72 y.o.   MRN: [id number]  HPI Patient is a 72 yo female who presents to discuss episode of ear fullness, facial numbness and tingling.  Notes first episode was 8/18. Started after taking 2 aleve on 8/17 pm and 4 more on 8/18. Noted tight chest, left ear closed up. Does not know how long this lasted. 2nd episode occurred last Friday while sitting at bar drinking soda and eating crackers. Left side of neck started to hurt with some tingling in left corner of mouth. 3rd episode was on Monday. States left side of face felt funny with numbness and tingling. Also with ear fullness. Lasted 10-15 minutes. Also notes mild weakness in bilateral lower legs during these episodes. Notes no other neurological abnormalities.  Notes has been taking ASA 81 mg and 325 mg tablets every day. Also notes potential previous TIA in 2009 for which she had an MRI that revealed mild small vessel ischemia.  Denies tinnitus, dizziness, vision changes, and headaches.   Past Medical History  Diagnosis Date  . Diabetes mellitus   . Hypertension   . Anxiety   . Gout   . Gastroparesis due to DM   . Hyperlipidemia   . Osteopenia   . Arthritis   . GERD (gastroesophageal reflux disease)      Review of Systems see HPI     Objective:   Physical Exam  Constitutional: She appears well-developed and well-nourished.  HENT:  Head: Normocephalic and atraumatic.  Mouth/Throat: Oropharynx is clear and moist.  Eyes: Conjunctivae are normal. Pupils are equal, round, and reactive to light.  Neck: Neck supple.  Neurological:  5/5 strength throughout, sensation to light touch throughout, left patellar reflex 1+, right 2+, CN 2-12 intact  BP 151/77  Pulse 85  Ht 5' (1.524 m)  Wt 186 lb (84.369 kg)  BMI 36.33 kg/m2     Assessment & Plan:  More than 25 minutes was spent in the care of this patient.

## 2013-08-17 NOTE — Assessment & Plan Note (Signed)
Differential includes trigeminal neuralgia, TIA, giant cell arteritis. Less concerned for TIA and giant cell arteritis given odd distribution of symptoms for TIA and no tenderness in temporal distribution. Will plan on treating for potential trigeminal neuralgia with carbamazepine. Will obtain MRI brain to evaluate for potential causes. BMP ordered as well for potential contrast with MRI.

## 2013-08-17 NOTE — Telephone Encounter (Signed)
MRI cancelled at [facility] and rescheduled at GI for thur. Sept 4 @4 :15p.  Pt is aware of this appt.  Rebecca Walter,CMA

## 2013-08-18 ENCOUNTER — Telehealth: Payer: Self-pay | Admitting: Family Medicine

## 2013-08-18 NOTE — Telephone Encounter (Signed)
[facility] After Hours Line.   Patient wanted to make sure she was given right medicine for her complaints. Reviewed notes and Dr. Birdie Sons prescribed carbamazepime for possible trigeminal neuralgia. Told patient this was the correct medicine. She states she doesn't like taking medicine and felt slightly groggy this morning and improved after breakfast-told her unclear if related to medicine and she likely just needed to eat given she has diabetes. Encouraged her to call on Tuesday to discuss with PCP or schedule appointment. If red flags were to arise, she should follow plan laid out by Dr. Birdie Sons.   Rebecca Walter. Marti Sleigh, MD, PGY3 08/18/2013 12:58 PM

## 2013-08-20 ENCOUNTER — Other Ambulatory Visit: Payer: Self-pay | Admitting: Family Medicine

## 2013-08-21 ENCOUNTER — Ambulatory Visit (HOSPITAL_COMMUNITY): Payer: Medicare Other

## 2013-08-21 ENCOUNTER — Ambulatory Visit: Payer: Medicare Other | Admitting: Family Medicine

## 2013-08-23 ENCOUNTER — Ambulatory Visit
Admission: RE | Admit: 2013-08-23 | Discharge: 2013-08-23 | Disposition: A | Discharge status: Home / Self Care | Payer: Medicare Other | Source: Ambulatory Visit | Attending: Family Medicine | Admitting: Family Medicine

## 2013-08-23 DIAGNOSIS — R200 Anesthesia of skin: Secondary | ICD-10-CM

## 2013-08-27 ENCOUNTER — Telehealth: Payer: Self-pay | Admitting: Home Health Services

## 2013-08-27 ENCOUNTER — Telehealth: Payer: Self-pay | Admitting: Family Medicine

## 2013-08-27 NOTE — Telephone Encounter (Signed)
Will forward to MD to give results. Jazmin Hartsell,CMA  

## 2013-08-27 NOTE — Telephone Encounter (Signed)
Pt is aware of results and knows that she needs to make an appt to be seen for her dizziness when she can get a ride to office.  Rebecca Walter,CMA

## 2013-08-27 NOTE — Telephone Encounter (Signed)
Rebecca Walter called and as concerned about light headedness this morning when she got up.  She checked her BP 155/76 /BG 114 and both were within normal ranges.  Pt ate something and went back to bed.   She currently is not experience any light headedness but is concerned it may come back.  We talked about taking it easy today especially when transferring or standing up.   If symptoms return or worse she will call either 911 or schedule an office visit with [place].

## 2013-08-27 NOTE — Telephone Encounter (Signed)
Please inform the patient that her MRI revealed nothing that would account for the symptoms she was having previously. There were some small areas that were unchanged from her 2009 MRI that potentially could be due to small vessel ischemic disease or migraine headaches, but this would not have caused her symptoms. They also noted a couple of areas of degenerative changes in her neck. These would also not cause her symptoms, but they are something that we need to watch for effects of these in the future. Please inform the patient of this. Also if patient is continuing to feel dizzy and light headed she should be seen for an appointment. Thanks.

## 2013-08-27 NOTE — Telephone Encounter (Signed)
She is also feeling dizzy and light headed.

## 2013-08-27 NOTE — Telephone Encounter (Signed)
Pt wants to know results from Thursdays MRI.

## 2013-08-28 ENCOUNTER — Ambulatory Visit (INDEPENDENT_AMBULATORY_CARE_PROVIDER_SITE_OTHER): Payer: Medicare Other | Admitting: Family Medicine

## 2013-08-28 ENCOUNTER — Encounter: Payer: Self-pay | Admitting: Family Medicine

## 2013-08-28 VITALS — BP 132/76 | HR 92 | Temp 98.3°F | Wt 186.0 lb

## 2013-08-28 DIAGNOSIS — R42 Dizziness and giddiness: Secondary | ICD-10-CM

## 2013-08-28 NOTE — Patient Instructions (Signed)
Nice to see you again. I believe your dizziness is related to benign paroxysmal positional vertigo. I have included a handout with maneuvers that should help with this. I f this does not improve please let us know.

## 2013-08-29 ENCOUNTER — Other Ambulatory Visit: Payer: Self-pay | Admitting: Family Medicine

## 2013-08-29 DIAGNOSIS — R42 Dizziness and giddiness: Secondary | ICD-10-CM

## 2013-08-29 HISTORY — DX: Dizziness and giddiness: R42

## 2013-08-29 NOTE — Progress Notes (Signed)
  Subjective:    Patient ID: Rebecca Walter, female    DOB: 28-Sep-1941, 72 y.o.   MRN: [id number]  HPI Patient is a 72 yo female who presents with dizziness.  DIZZY  Onset day prior to visit. Description got up and felt dizzy. Felt as though the room was spinning around her and she didn't have her balance. Better with: being still for about 1 minute after dizziness starts Worse with: movement of her head after laying down or raising from laying position.   Symptoms Hearing loss: no  Ear pain/fullness: yes, fullness, no pain Nausea/vomiting: no  Loss of vision: no History of trauma: no   Red Flags Focal weakness: no  Trouble speaking: no  Severe headache: no  On anticoagulants: is on aspirin  Notably, recently had MRI without evidence of acute processes. Did show chronic ischemic changes.  Denied palpitations, chest pain, and shortness of breath.  Review of Systems see HPI     Objective:   Physical Exam  Constitutional: She appears well-developed and well-nourished. No distress.  HENT:  Head: Normocephalic and atraumatic.  Mouth/Throat: Oropharynx is clear and moist.  Bilateral TMs normal  Eyes: Conjunctivae are normal. Pupils are equal, round, and reactive to light.  Neck: Neck supple.  Lymphadenopathy:    She has no cervical adenopathy.  Neurological:  5/5 strength throughout, sensation to light touch intact throughout, CN 2-12 intact, positive dix-halpike maneuver to the left without nystagmus  BP 132/76  Pulse 92  Temp(Src) 98.3 F (36.8 C) (Oral)  Wt 186 lb (84.369 kg)  BMI 36.33 kg/m2     Assessment & Plan:

## 2013-08-29 NOTE — Assessment & Plan Note (Addendum)
Sudden onset room spinning sensation with positional changes of her head. Given positive dix-halpike maneuver this is most likely related to BPPV. Unlikely to be related to stroke or TIA given otherwise normal neurological exam. Could additionally be related to postural presyncope (less likely given negative orthostatics), vestibular neuritis, meniere's disease. Unlikely to be related to mass lesion as patient recently had MRI that revealed no masses. Patient given epley maneuver to complete at home. Discussed medication for dizziness and patient decided against this at this time.

## 2013-08-30 ENCOUNTER — Telehealth: Payer: Self-pay | Admitting: Family Medicine

## 2013-08-30 NOTE — Telephone Encounter (Signed)
I have refilled this medication. Please call this in to the pharmacy and inform the patient we will need to discuss this medication and her anxiety prior to additional refills. Thanks.

## 2013-08-30 NOTE — Telephone Encounter (Signed)
Pt advised that Rx was called in, and that i verified with lorazapam Sherlonda Flater, [name]

## 2013-08-30 NOTE — Telephone Encounter (Signed)
Pt called to check on the status of her lorazepam? JW

## 2013-08-30 NOTE — Telephone Encounter (Signed)
Rx called into pharmacy. Fleeger, Jessica Dawn  

## 2013-08-31 ENCOUNTER — Telehealth: Payer: Self-pay | Admitting: Family Medicine

## 2013-08-31 NOTE — Telephone Encounter (Addendum)
Her dizziness could be enhanced by the carbamazepine. She could try coming off of this, though should taper off by decreasing to 100 mg in the morning and 50 mg at night for 3 days, then 50 mg twice a day for 30 days, then 50 mg once a day for 3 days. It is important that she taper off of this so that she does not develop withdrawal symptoms.

## 2013-08-31 NOTE — Telephone Encounter (Signed)
Pt wants to know if her dizziness is enhanced by carbamazepine Please advise

## 2013-09-03 NOTE — Telephone Encounter (Signed)
Pt states that she is not going to start taking this medication again.  She hasn't had it since Thursday and feels like she will be fine. Rebecca Walter,CMA

## 2013-09-03 NOTE — Telephone Encounter (Signed)
She also mentioned that she feels better. She has had a little dizziness over the weekend but it is much better since not being on medication.  Astrid Vides,CMA

## 2013-09-03 NOTE — Telephone Encounter (Signed)
Pt states that she only has 100mg  capsules.  States that she never heard anything from [place] on Friday so she hasn't had any medication since Friday.  Would like to know if you still want her to taper off.  Please advise.  Jazmin Hartsell,CMA

## 2013-09-03 NOTE — Telephone Encounter (Signed)
Please advise the patient to take one capsule each day for the next 2 days and then one capsule every other day for six days. This should be an adequate taper.

## 2013-09-11 ENCOUNTER — Ambulatory Visit: Payer: Medicare Other | Admitting: Family Medicine

## 2013-09-17 ENCOUNTER — Other Ambulatory Visit: Payer: Self-pay | Admitting: Family Medicine

## 2013-09-20 ENCOUNTER — Ambulatory Visit: Payer: Medicare Other | Admitting: Family Medicine

## 2013-09-24 ENCOUNTER — Encounter: Payer: Self-pay | Admitting: Family Medicine

## 2013-09-24 ENCOUNTER — Ambulatory Visit (INDEPENDENT_AMBULATORY_CARE_PROVIDER_SITE_OTHER): Payer: Medicare Other | Admitting: Family Medicine

## 2013-09-24 VITALS — BP 151/75 | HR 78 | Temp 98.0°F | Wt 186.0 lb

## 2013-09-24 DIAGNOSIS — IMO0002 Reserved for concepts with insufficient information to code with codable children: Secondary | ICD-10-CM

## 2013-09-24 DIAGNOSIS — E1165 Type 2 diabetes mellitus with hyperglycemia: Secondary | ICD-10-CM

## 2013-09-24 DIAGNOSIS — F411 Generalized anxiety disorder: Secondary | ICD-10-CM

## 2013-09-24 DIAGNOSIS — I10 Essential (primary) hypertension: Secondary | ICD-10-CM

## 2013-09-24 DIAGNOSIS — E785 Hyperlipidemia, unspecified: Secondary | ICD-10-CM

## 2013-09-24 LAB — POCT GLYCOSYLATED HEMOGLOBIN (HGB A1C): Hemoglobin A1C: 6.4

## 2013-09-24 MED ORDER — LORAZEPAM 1 MG PO TABS: ORAL_TABLET | ORAL | Status: DC

## 2013-09-24 MED ORDER — BENAZEPRIL HCL 20 MG PO TABS: 40.0000 mg | ORAL_TABLET | Freq: Every day | ORAL | Status: DC

## 2013-09-24 MED ORDER — HYDROCHLOROTHIAZIDE 12.5 MG PO CAPS: 12.5000 mg | ORAL_CAPSULE | Freq: Every day | ORAL | Status: DC

## 2013-09-24 NOTE — Patient Instructions (Addendum)
Nice to see you again. We will increase your benazepril dose to 40 mg a day. Please continue to check your blood pressure at home. We will call with the results of your A1c and please schedule an appointment for labs for a cholesterol check.

## 2013-09-25 ENCOUNTER — Telehealth: Payer: Self-pay | Admitting: *Deleted

## 2013-09-25 DIAGNOSIS — I10 Essential (primary) hypertension: Secondary | ICD-10-CM

## 2013-09-25 MED ORDER — BENAZEPRIL HCL 10 MG PO TABS: 30.0000 mg | ORAL_TABLET | Freq: Every day | ORAL | Status: DC

## 2013-09-25 NOTE — Telephone Encounter (Signed)
Message copied by Henri Medal on Tue Sep 25, 2013  8:46 AM ------      Message from: Birdie Sons, ERIC G      Created: Tue Sep 25, 2013  7:19 AM       Patients A1c is acceptable at 6.4. Please inform the patient. Thanks. ------

## 2013-09-25 NOTE — Telephone Encounter (Signed)
Please advise the patient that I will change her prescription to 30 mg daily and she will take 10 mg tablets to get to the full 30 mg daily. I am in the ED today and unsure when I will get to leave. I will attempt to call her later if she has additional questions. Thanks.

## 2013-09-25 NOTE — Telephone Encounter (Signed)
Pt is aware.  Has a concern about her bp medications.  She states that she is not comfortable with taking 40mg  of medication in the morning.  Would like to know if there is anyway she can go up on benazepril in a smaller increment than 20mg .  States that she was given 40mg  by mistake at pharmacy before and it caused lightheadedness.  Please advise and would like to speak with you about it.  Jazmin Hartsell,CMA

## 2013-09-25 NOTE — Assessment & Plan Note (Signed)
A1c at goal. Will continue current regimen.

## 2013-09-25 NOTE — Telephone Encounter (Signed)
Pt is aware.  Lucynda Rosano,CMA  

## 2013-09-25 NOTE — Assessment & Plan Note (Signed)
Lorazepam refilled with 3 month supply. Discussed starting SSRI for anxiety and patient declined and then asked if we could increase her dose of lorazepam. I explained to the patient that lorazepam is not the long term solution to her anxiety and getting on a long term medication, like an SSRI, would help with her anxiety, she continued to refuse this medication.

## 2013-09-25 NOTE — Assessment & Plan Note (Signed)
Patient not at goal BP. Initially increased benazepril to 40 mg daily and patient called back in to the office express concerns with this dose. I decreased dosing to 30 mg daily. Will see back in one month.

## 2013-09-25 NOTE — Assessment & Plan Note (Signed)
Will have return for fasting lipid panel. Continue current treatment.

## 2013-09-25 NOTE — Progress Notes (Signed)
Patient ID: Rebecca Walter, female   DOB: 01/10/1941, 72 y.o.   MRN: [id number] Rebecca Walter is a 72 y.o. female who presents today for f/u of HTN, HLD, DM, and anxiety.  HYPERTENSION Disease Monitoring: Blood pressure range-150-160 systolic Chest pain, palpitations- no      Dyspnea- no Medications: Compliance- taking Lightheadedness,Syncope- no   Edema- no  DIABETES Disease Monitoring: Blood Sugar ranges-states is checking but didn't bring her log with her Medications: Compliance- taking Hypoglycemic symptoms- states has been several months since has felt like her blood sugar is too low  HYPERLIPIDEMIA Disease Monitoring: See symptoms for Hypertension Medications: Compliance- taking  Muscle aches- no  Anxiety: is taking lorazepam. States has always had anxiety. Something "hits wrong" and then she worries about it. States has not reacted well to [facility] in the past. Asks if there is any way to increase her dose of lorazepam.   Past Medical History  Diagnosis Date  . Diabetes mellitus   . Hypertension   . Anxiety   . Gout   . Gastroparesis due to DM   . Hyperlipidemia   . Osteopenia   . Arthritis   . GERD (gastroesophageal reflux disease)     History  Smoking status  . Never Smoker   Smokeless tobacco  . Never Used    Family History  Problem Relation Age of Onset  . Stroke Mother   . Heart disease Father   . Stroke Father   . Hypertension Father   . Cancer Sister   . Cancer Brother   . Heart disease Brother   . Diabetes Maternal Uncle     Current Outpatient Prescriptions on File Prior to Visit  Medication Sig Dispense Refill  . ACCU-CHEK AVIVA PLUS test strip Check blood sugar first  thing in the morning before eating,after lunch and  after dinner total of 3  times daily  300 each  6  . allopurinol (ZYLOPRIM) 100 MG tablet TAKE 1 TABLET (100 MG TOTAL) BY MOUTH DAILY.  30 tablet  11  . amLODipine (NORVASC) 10 MG tablet TAKE 1 TABLET (10 MG TOTAL) BY MOUTH  DAILY.  30 tablet  11  . aspirin 81 MG chewable tablet Chew 1 tablet (81 mg total) by mouth daily.  30 tablet  5  . B-D ULTRAFINE III SHORT PEN 31G X 8 MM MISC USE AS DIRECTED  100 each  5  . carbamazepine (CARBATROL) 100 MG 12 hr capsule Take 1 capsule (100 mg total) by mouth 2 (two) times daily.  60 capsule  0  . COLCRYS 0.6 MG tablet TAKE 1 TABLET BY MOUTH EVERY DAY  30 tablet  5  . DEMADEX 20 MG tablet TAKE 1 TABLET (20 MG TOTAL) BY MOUTH DAILY.  90 tablet  2  . glipiZIDE (GLUCOTROL XL) 10 MG 24 hr tablet TAKE 1 TABLET (10 MG TOTAL) BY MOUTH DAILY.  90 tablet  3  . insulin glargine (LANTUS) 100 UNIT/ML injection Inject 55 Units into the skin daily.      . Lancets (FREESTYLE) lancets Use as directed      . LANTUS SOLOSTAR 100 UNIT/ML injection INJECT 50 UNITS INTO THE SKIN DAILY.  15 pen  13  . loratadine (CLARITIN) 10 MG tablet Take 1 tablet (10 mg total) by mouth daily.  30 tablet  5  . lovastatin (MEVACOR) 40 MG tablet TAKE 2 TABLETS (80 MG TOTAL) BY MOUTH DAILY.  30 tablet  5  . metFORMIN (GLUCOPHAGE-XR) 500 MG 24  hr tablet TAKE 1 TABLET BY MOUTH 3 TIMES A DAY BEFORE MEALS  270 tablet  2  . metoCLOPramide (REGLAN) 5 MG tablet Take 1 tablet (5 mg total) by mouth 3 (three) times daily before meals.  90 tablet  5  . Multiple Vitamin (MULTIVITAMIN) tablet Take 1 tablet by mouth daily.        Marland Kitchen NASONEX 50 MCG/ACT nasal spray INSTILL 2 SPRAYS BY NASAL ROUTE DAILY.  17 g  0  . NOVOLOG FLEXPEN 100 UNIT/ML injection INJECT 15 UNITS INTO THE SKIN 3 (THREE) TIMES DAILY BEFORE MEALS. PRIOR TO EVENING MEAL.  15 Syringe  5  . pantoprazole (PROTONIX) 40 MG tablet TAKE 1 TABLET (40 MG TOTAL) BY MOUTH DAILY.  30 tablet  5  . Probiotic Product (PROBIOTIC PEARLS) CAPS Take by mouth.      . vitamin E (NATURAL MIXED TOCOPHEROLS) 400 UNIT capsule Take 800 Units by mouth daily.         No current facility-administered medications on file prior to visit.    ROS: Per HPI   Physical Exam Filed Vitals:    09/24/13 1336  BP: 151/75  Pulse: 78  Temp: 98 F (36.7 C)    Physical Examination: General appearance - alert, well appearing, and in no distress Chest - clear to auscultation, no wheezes, rales or rhonchi, symmetric air entry Heart - normal rate, regular rhythm, normal S1, S2, no murmurs, rubs, clicks or gallops Extremities - no pedal edema noted  Lab Results  Component Value Date   HGBA1C 6.4 09/24/2013   Assessment/Plan: Please see individual problem list.   I have spent >25 minutes in the care of this patient with >50% spent in counseling/coordination of care regarding HTN, HLD, DM, and anxiety.

## 2013-10-08 ENCOUNTER — Other Ambulatory Visit: Payer: Self-pay | Admitting: Family Medicine

## 2013-10-09 NOTE — Telephone Encounter (Signed)
Pt called and would like a refill sent to her pharmacy of novolog. JW

## 2013-10-10 ENCOUNTER — Telehealth: Payer: Self-pay | Admitting: Family Medicine

## 2013-10-10 NOTE — Telephone Encounter (Signed)
Tried calling, line busy. Will try again later.

## 2013-10-10 NOTE — Telephone Encounter (Signed)
Pt call her blood sugar in the 200 and this is unusual  for her and would like too talk to a nurse or someone on the team. Myriam Jacobson

## 2013-10-10 NOTE — Telephone Encounter (Signed)
Refilled novolog for patient.

## 2013-10-11 ENCOUNTER — Telehealth: Payer: Self-pay | Admitting: Home Health Services

## 2013-10-11 NOTE — Telephone Encounter (Signed)
Spoke with Rebecca Walter.  Pt reports taking all medications as prescribed.  Pt is concerned that he blood glucose is running around 200.  Pt reports nothing new to diet, pt reports little physical activity.   Pt is waiting for doctor to return her call, meanwhile we talked about avoiding foods the spike blood glucose such as breads/sweets.  Pt's overall goal is dm/htn management, weight loss.

## 2013-10-11 NOTE — Telephone Encounter (Signed)
Patient worried because she states that for the past week her blood sugars have been in the 200's at night (have remained in the 120-140 range during the day) but states she has never had a problem with her sugars being high at night and wanted to know if she needed to have lantus added to take at night (already taking every morning). Informed patient I would send a message to her MD, but she states she would really like to speak with Dr. Raymondo Band as well. Will forward to both.

## 2013-10-12 MED ORDER — GLIPIZIDE ER 10 MG PO TB24: 10.0000 mg | ORAL_TABLET | Freq: Two times a day (BID) | ORAL | Status: DC

## 2013-10-12 NOTE — Telephone Encounter (Signed)
MD contacted [place] and let us know that we could ignore thsi message. Fleeger, [name]

## 2013-10-12 NOTE — Telephone Encounter (Signed)
Pt called about her blood sugars. She wants to know what to do She is very worried

## 2013-10-12 NOTE — Addendum Note (Signed)
Addended by: Dessa Phi on: 10/12/2013 03:21 PM   Modules accepted: Orders

## 2013-10-12 NOTE — Telephone Encounter (Signed)
Please have the patient increase her novolog to 20 units with her evening meal from 15 units. She should continue to use 15 units with her other meals. She should continue to use the same amount of lantus. If her blood sugars come down below 100 with this change she should go back to the 15 units with dinner. She should schedule an appointment to discuss further changes and she should bring her blood sugar log with her to that appointment as this will give me a better idea of the trend of her blood sugars.

## 2013-10-12 NOTE — Telephone Encounter (Signed)
Spoke to patient on the phone as afternoon preceptor since her PCP is post call and not available.  Patient reports elevated fasting CBG to 189 this AM. Post prandial CBG 211. Her diet is unchanged She has GI upset. No fever, diarrhea, vomiting.   She has increased her lantus to 60 U in the AM and want to know if she can take 50 U at night as well. She takes novolog 15 U BID (not TID).  She takes metformin and glipizide.  Assessment:  Well controlled diabetes Recent elevations in fasting CBGs. Patient not aware of goals or the difference between fasting and non-fasting.  Lab Results  Component Value Date   HGBA1C 6.4 09/24/2013    Plan: Education:  Goal fasting  < 130. Goal non-fasting < 200.  Keep AM lantus at 60 U Keep novolog at 15 U BID Metformin 500 mg TID (patient reports that she can not tolerate an increase) Glipizide increase to 10 U BID  Patient has PCP f/u in two weeks.

## 2013-10-12 NOTE — Telephone Encounter (Signed)
Will forward to MD. Hyman Crossan,CMA  

## 2013-10-25 ENCOUNTER — Other Ambulatory Visit: Payer: Self-pay

## 2013-10-31 ENCOUNTER — Encounter: Payer: Self-pay | Admitting: Family Medicine

## 2013-10-31 ENCOUNTER — Ambulatory Visit (INDEPENDENT_AMBULATORY_CARE_PROVIDER_SITE_OTHER): Payer: Medicare Other | Admitting: Family Medicine

## 2013-10-31 VITALS — BP 148/69 | HR 96 | Temp 98.7°F | Ht 60.0 in | Wt 186.0 lb

## 2013-10-31 DIAGNOSIS — E1165 Type 2 diabetes mellitus with hyperglycemia: Secondary | ICD-10-CM

## 2013-10-31 DIAGNOSIS — R109 Unspecified abdominal pain: Secondary | ICD-10-CM

## 2013-10-31 DIAGNOSIS — IMO0002 Reserved for concepts with insufficient information to code with codable children: Secondary | ICD-10-CM

## 2013-10-31 DIAGNOSIS — F411 Generalized anxiety disorder: Secondary | ICD-10-CM

## 2013-10-31 DIAGNOSIS — I10 Essential (primary) hypertension: Secondary | ICD-10-CM

## 2013-10-31 LAB — COMPREHENSIVE METABOLIC PANEL
Albumin: 4.5 g/dL (ref 3.5–5.2)
Alkaline Phosphatase: 53 U/L (ref 39–117)
BUN: 18 mg/dL (ref 6–23)
CO2: 29 mEq/L (ref 19–32)
Calcium: 9.8 mg/dL (ref 8.4–10.5)
Chloride: 98 mEq/L (ref 96–112)
Glucose, Bld: 210 mg/dL — ABNORMAL HIGH (ref 70–99)
Potassium: 4.2 mEq/L (ref 3.5–5.3)
Sodium: 138 mEq/L (ref 135–145)
Total Protein: 7.5 g/dL (ref 6.0–8.3)

## 2013-10-31 MED ORDER — LORAZEPAM 1 MG PO TABS: ORAL_TABLET | ORAL | Status: DC

## 2013-10-31 MED ORDER — GLYBURIDE 5 MG PO TABS: 5.0000 mg | ORAL_TABLET | Freq: Every day | ORAL | Status: DC

## 2013-10-31 NOTE — Patient Instructions (Signed)
Nice to see you. We are going to request records from your GI doctor to see what has been done for your stomach discomfort. We will call you with the lab results. Please use your ativan 2-3 times a day for your anxiety. If this does not help, please let me know.

## 2013-11-01 ENCOUNTER — Telehealth: Payer: Self-pay | Admitting: *Deleted

## 2013-11-01 NOTE — Telephone Encounter (Signed)
Prior authorization form for glyburide placed in MD box.

## 2013-11-02 ENCOUNTER — Encounter: Payer: Self-pay | Admitting: *Deleted

## 2013-11-02 NOTE — Progress Notes (Signed)
Patient ID: Rebecca Walter, female   DOB: 05/04/1941, 72 y.o.   MRN: [id number] Rebecca Walter is a 72 y.o. female who presents today for f/u of anxiety, DM, and stomach discomfort.  Anxiety: has continued to have issues with not sleeping well, being irritated easily, and feeling as though her nerves are shot. Has been taking ativan just at night for this. Notes previously on librium for this issue.  DIABETES Disease Monitoring: Blood Sugar ranges-typically <200 in the morning, has been into the 120's during the day. Medications: Compliance- taking. Patient is on glipizide, metformin, lantus, and novolog. Takes novolog 10-15 units. 10 if CBG 150-200, 15 if CBG >200. Hypoglycemic symptoms- rarely, will eat something if feels this and will feel better  Stomach discomfort: notes just saw her GI doctor last Tuesday. She describes not having an appetite and food turning her off. States it is not pain, but is a discomfort that is accompanied by nausea before and after she eats. She denies vomiting and diarrhea. She notes having normal BMs daily. She is on reglan and protonix, both of which help some. Notes GI told her to take protonix BID.   Past Medical History  Diagnosis Date  . Diabetes mellitus   . Hypertension   . Anxiety   . Gout   . Gastroparesis due to DM   . Hyperlipidemia   . Osteopenia   . Arthritis   . GERD (gastroesophageal reflux disease)     History  Smoking status  . Never Smoker   Smokeless tobacco  . Never Used    Family History  Problem Relation Age of Onset  . Stroke Mother   . Heart disease Father   . Stroke Father   . Hypertension Father   . Cancer Sister   . Cancer Brother   . Heart disease Brother   . Diabetes Maternal Uncle     Current Outpatient Prescriptions on File Prior to Visit  Medication Sig Dispense Refill  . ACCU-CHEK AVIVA PLUS test strip Check blood sugar first  thing in the morning before eating,after lunch and  after dinner total of 3   times daily  300 each  6  . allopurinol (ZYLOPRIM) 100 MG tablet TAKE 1 TABLET (100 MG TOTAL) BY MOUTH DAILY.  30 tablet  11  . amLODipine (NORVASC) 10 MG tablet TAKE 1 TABLET (10 MG TOTAL) BY MOUTH DAILY.  30 tablet  11  . aspirin 81 MG chewable tablet Chew 1 tablet (81 mg total) by mouth daily.  30 tablet  5  . B-D ULTRAFINE III SHORT PEN 31G X 8 MM MISC USE AS DIRECTED  100 each  5  . benazepril (LOTENSIN) 10 MG tablet Take 3 tablets (30 mg total) by mouth daily.  90 tablet  1  . carbamazepine (CARBATROL) 100 MG 12 hr capsule Take 1 capsule (100 mg total) by mouth 2 (two) times daily.  60 capsule  0  . COLCRYS 0.6 MG tablet TAKE 1 TABLET BY MOUTH EVERY DAY  30 tablet  5  . DEMADEX 20 MG tablet TAKE 1 TABLET (20 MG TOTAL) BY MOUTH DAILY.  90 tablet  2  . hydrochlorothiazide (MICROZIDE) 12.5 MG capsule Take 1 capsule (12.5 mg total) by mouth daily.  30 capsule  11  . insulin glargine (LANTUS) 100 UNIT/ML injection Inject 55 Units into the skin daily.      . Lancets (FREESTYLE) lancets Use as directed      . LANTUS SOLOSTAR 100 UNIT/ML  injection INJECT 50 UNITS INTO THE SKIN DAILY.  15 pen  13  . loratadine (CLARITIN) 10 MG tablet Take 1 tablet (10 mg total) by mouth daily.  30 tablet  5  . lovastatin (MEVACOR) 40 MG tablet TAKE 2 TABLETS (80 MG TOTAL) BY MOUTH DAILY.  30 tablet  5  . metFORMIN (GLUCOPHAGE-XR) 500 MG 24 hr tablet TAKE 1 TABLET BY MOUTH 3 TIMES A DAY BEFORE MEALS  270 tablet  2  . metoCLOPramide (REGLAN) 5 MG tablet Take 1 tablet (5 mg total) by mouth 3 (three) times daily before meals.  90 tablet  5  . Multiple Vitamin (MULTIVITAMIN) tablet Take 1 tablet by mouth daily.        Marland Kitchen NASONEX 50 MCG/ACT nasal spray INSTILL 2 SPRAYS BY NASAL ROUTE DAILY.  17 g  0  . NOVOLOG FLEXPEN 100 UNIT/ML SOPN FlexPen INJECT 15 UNITS INTO THE SKIN 3 (THREE) TIMES DAILY BEFORE MEALS. PRIOR TO EVENING MEAL.  15 pen  1  . pantoprazole (PROTONIX) 40 MG tablet TAKE 1 TABLET (40 MG TOTAL) BY MOUTH DAILY.   30 tablet  5  . Probiotic Product (PROBIOTIC PEARLS) CAPS Take by mouth.      . vitamin E (NATURAL MIXED TOCOPHEROLS) 400 UNIT capsule Take 800 Units by mouth daily.         No current facility-administered medications on file prior to visit.    ROS: Per HPI   Physical Exam Filed Vitals:   10/31/13 1338  BP: 148/69  Pulse: 96  Temp: 98.7 F (37.1 C)    Physical Examination: General appearance - alert, well appearing, and in no distress Chest - clear to auscultation, no wheezes, rales or rhonchi, symmetric air entry Heart - normal rate, regular rhythm, normal S1, S2, no murmurs, rubs, clicks or gallops Abdomen - soft, mild tenderness to palpation in epigastric region, nondistended, no masses or organomegaly Extremities - no pedal edema noted   Assessment/Plan: Please see individual problem list.  I have spent >25 minutes in the care of this patient with >50% spent in counseling/coordination of care regarding anxiety, diabetes, and stomach discomfort.

## 2013-11-02 NOTE — Assessment & Plan Note (Signed)
Patient with A1c at goal, though with some elevated CBGs throughout the day. Will continue current insulin regimen and metformin. Will transition to glyburide given patient age. Will continue to follow-up with her diet and her diabetic teaching.

## 2013-11-02 NOTE — Assessment & Plan Note (Signed)
Patient with history of GERD and gastroparesis. This discomfort could be a combination of both of these or related to one of them. I am going to request the records from [facility] to see what they have to say about the issue. I suspect it is related to her gastroparesis more than her GERD.

## 2013-11-02 NOTE — Assessment & Plan Note (Signed)
I again discussed with the patient that given her continued anxiety, she would likely benefit from a longer term medication such as fluoxetine to help with her anxiety. She continued to decline this treatment as she states she felt the wellbutrin she was on previously did not help. She additionally asked if she could be placed on librium instead of ativan. I do not think there will be a great benefit of this over ativan, especially considering she is only taking the ativan once daily for her anxiety. I will increase the frequency with which she can take the ativan to TID prn. I will continue to discuss other long term treatment options with the patient at future visits.

## 2013-11-02 NOTE — Assessment & Plan Note (Signed)
At goal. Continue current treatments.

## 2013-11-05 ENCOUNTER — Telehealth: Payer: Self-pay | Admitting: Family Medicine

## 2013-11-05 ENCOUNTER — Telehealth: Payer: Self-pay

## 2013-11-05 MED ORDER — CLORAZEPATE DIPOTASSIUM 7.5 MG PO TABS: 7.5000 mg | ORAL_TABLET | Freq: Two times a day (BID) | ORAL | Status: DC | PRN

## 2013-11-05 MED ORDER — FLUOXETINE HCL 10 MG PO TABS: 5.0000 mg | ORAL_TABLET | Freq: Every day | ORAL | Status: DC

## 2013-11-05 NOTE — Telephone Encounter (Signed)
I will fill the chlorazepate for the patient. Though I will only fill for 45 tablets at this time. This medication is the equivalent of ativan and will likely not provide the relief that she needs without addition of a longer acting medication for her anxiety. She needs to consider a longer acting medication for her anxiety or this will likely not improve. The other medications to consider that are not SSRI's like prozac would be effexor or buspar. We need to have a discussion regarding starting one of these medications in the next couple of weeks to help with her anxiety. She will need to be seen back in clinic to have a discussion of long term medications prior to additional benzodiazapine's being prescribed. She should discontinue her ativan at this time. The prescription will be left up front for the patient to pick up.

## 2013-11-05 NOTE — Telephone Encounter (Signed)
Please see next phone note. Fleeger, [name]

## 2013-11-05 NOTE — Telephone Encounter (Signed)
Contacted pharmacy and asked then to hold this Rx for now, we would call back if needed to fill it.  Will forward message to MD. Milas Gain, [name]

## 2013-11-05 NOTE — Telephone Encounter (Signed)
Pt call and does not want to take Prozac. She does not like it. She would like Clorazepote ? Please call the pharmacy and tell them not to fill the prozac , she doesn't want her son to pick this up. JW

## 2013-11-05 NOTE — Telephone Encounter (Signed)
Patient states Ativan is not working for her anxiety. She states that her and Dr. Birdie Sons discussed other meds that they would try if Ativan did not work. Please call patient once new meds have been sent to pharmacy. Patient states that she will call back later today, if she does not hear from anyone.

## 2013-11-05 NOTE — Telephone Encounter (Signed)
I sent in a prescription for fluoxetine for the patient. She should take 1/2 a tablet daily of this. After one week if she tolerates this, she can increase to taking one whole tablet. Please advise that this could take a couple of weeks of treatment prior to seeing a real benefit.

## 2013-11-06 NOTE — Telephone Encounter (Signed)
Pt is aware of message.  Will try to either call in rx or have sonnenberg come to office and sign it. Jazmin Hartsell,CMA

## 2013-11-06 NOTE — Telephone Encounter (Signed)
Spoke with pharmacy and rx is there and they are working on it.  Pt is aware also. Adia Crammer,CMA

## 2013-11-06 NOTE — Telephone Encounter (Signed)
Pt called pharmacy and was told the computer had been shut down and they did not have the prescription. Told ms Turrell it was called in. She wants Jasmin to call the phamacy again. Says to tell jasmin thank you

## 2013-11-06 NOTE — Telephone Encounter (Signed)
Was able to phone in rx to [facility] and pt is aware. Ludell Zacarias,CMA

## 2013-11-12 NOTE — Telephone Encounter (Signed)
Copy of prior authorization form for glyburide placed in MD box.

## 2013-11-12 NOTE — Telephone Encounter (Signed)
Completed form faxed to number provided. 

## 2013-11-20 ENCOUNTER — Other Ambulatory Visit: Payer: Self-pay | Admitting: Family Medicine

## 2013-11-22 ENCOUNTER — Other Ambulatory Visit (HOSPITAL_COMMUNITY): Payer: Self-pay | Admitting: Gastroenterology

## 2013-11-22 DIAGNOSIS — R110 Nausea: Secondary | ICD-10-CM

## 2013-12-07 ENCOUNTER — Encounter (HOSPITAL_COMMUNITY)
Admission: RE | Admit: 2013-12-07 | Discharge: 2013-12-07 | Disposition: A | Discharge status: Home / Self Care | Payer: Medicare Other | Source: Ambulatory Visit | Attending: Gastroenterology | Admitting: Gastroenterology

## 2013-12-07 DIAGNOSIS — K219 Gastro-esophageal reflux disease without esophagitis: Secondary | ICD-10-CM | POA: Insufficient documentation

## 2013-12-07 DIAGNOSIS — R110 Nausea: Secondary | ICD-10-CM

## 2013-12-07 MED ORDER — TECHNETIUM TC 99M SULFUR COLLOID
2.0000 | Freq: Once | INTRAVENOUS | Status: AC | PRN
  Administered 2013-12-07: 2 via ORAL

## 2013-12-07 MED ORDER — LIDOCAINE HCL 1 % IJ SOLN
INTRAMUSCULAR | Status: AC
  Filled 2013-12-07: qty 10

## 2013-12-18 ENCOUNTER — Other Ambulatory Visit: Payer: Self-pay | Admitting: Family Medicine

## 2013-12-26 ENCOUNTER — Encounter: Payer: Self-pay | Admitting: Family Medicine

## 2013-12-26 ENCOUNTER — Ambulatory Visit (INDEPENDENT_AMBULATORY_CARE_PROVIDER_SITE_OTHER): Payer: Medicare Other | Admitting: Family Medicine

## 2013-12-26 VITALS — BP 151/79 | HR 93 | Temp 98.8°F | Ht 60.0 in | Wt 193.0 lb

## 2013-12-26 DIAGNOSIS — E118 Type 2 diabetes mellitus with unspecified complications: Principal | ICD-10-CM

## 2013-12-26 DIAGNOSIS — IMO0002 Reserved for concepts with insufficient information to code with codable children: Secondary | ICD-10-CM

## 2013-12-26 DIAGNOSIS — E1165 Type 2 diabetes mellitus with hyperglycemia: Secondary | ICD-10-CM

## 2013-12-26 DIAGNOSIS — F411 Generalized anxiety disorder: Secondary | ICD-10-CM

## 2013-12-26 LAB — POCT GLYCOSYLATED HEMOGLOBIN (HGB A1C): HEMOGLOBIN A1C: 7

## 2013-12-26 MED ORDER — CHLORDIAZEPOXIDE HCL 5 MG PO CAPS: 5.0000 mg | ORAL_CAPSULE | Freq: Three times a day (TID) | ORAL | Status: DC | PRN

## 2013-12-26 NOTE — Patient Instructions (Signed)
Nice to see you again. We will try librium for your anxiety. Please take one tablet 3 times per day as needed. Do not take this with your previous prescriptions of ativan and clorazepate. I will see you back in 2 weeks to see how this medication is working. Please bring your blood sugar log at that time.

## 2013-12-27 ENCOUNTER — Telehealth: Payer: Self-pay | Admitting: Family Medicine

## 2013-12-27 NOTE — Telephone Encounter (Signed)
Pt called and now does not want to take the Librium because of the nausea and constipation. She would like to stay on Ativan and now will need a new prescription. jw

## 2013-12-27 NOTE — Telephone Encounter (Signed)
Pt has questions about librum (sp?) There were no appts until jan 30.  Prescription does not cover until the 30 Please advise

## 2013-12-27 NOTE — Telephone Encounter (Signed)
Rx was only written for 15 days. Dorian Renfro,CMA

## 2013-12-27 NOTE — Telephone Encounter (Signed)
Closed before you responded sorry. Phaedra Colgate,CMA

## 2013-12-29 NOTE — Progress Notes (Signed)
Patient ID: Rebecca Walter, female   DOB: 18-Feb-1941, 73 y.o.   MRN: [id number]  [name], MD Phone: [phone]  Rebecca Walter is a 73 y.o. female who presents today for discussion of stress and DM.  Stress: patient has been dealing with anxiety for many years. She is currently on ativan and states this helps, though wants to try librium as she has been on this in the past and felt it worked better for her. She notes she is on edge all the time, not sleeping as well, and not eating as well.  DIABETES Disease Monitoring: Blood Sugar ranges-160's-170's fasting      Visual problems- no Medications: Compliance- taking metformin, glipizide, lantus, novolog Hypoglycemic symptoms- no   Past Medical History  Diagnosis Date  . Diabetes mellitus   . Hypertension   . Anxiety   . Gout   . Gastroparesis due to DM   . Hyperlipidemia   . Osteopenia   . Arthritis   . GERD (gastroesophageal reflux disease)     History  Smoking status  . Never Smoker   Smokeless tobacco  . Never Used    Family History  Problem Relation Age of Onset  . Stroke Mother   . Heart disease Father   . Stroke Father   . Hypertension Father   . Cancer Sister   . Cancer Brother   . Heart disease Brother   . Diabetes Maternal Uncle     Current Outpatient Prescriptions on File Prior to Visit  Medication Sig Dispense Refill  . ACCU-CHEK AVIVA PLUS test strip Check blood sugar first  thing in the morning before eating,after lunch and  after dinner total of 3  times daily  300 each  6  . allopurinol (ZYLOPRIM) 100 MG tablet TAKE 1 TABLET (100 MG TOTAL) BY MOUTH DAILY.  30 tablet  11  . amLODipine (NORVASC) 10 MG tablet TAKE 1 TABLET (10 MG TOTAL) BY MOUTH DAILY.  30 tablet  11  . aspirin 81 MG chewable tablet Chew 1 tablet (81 mg total) by mouth daily.  30 tablet  5  . B-D ULTRAFINE III SHORT PEN 31G X 8 MM MISC USE AS DIRECTED  100 each  5  . benazepril (LOTENSIN) 10 MG tablet TAKE 3 TABLETS BY MOUTH  EVERY DAY  90 tablet  1  . COLCRYS 0.6 MG tablet TAKE 1 TABLET BY MOUTH EVERY DAY  30 tablet  5  . DEMADEX 20 MG tablet TAKE 1 TABLET (20 MG TOTAL) BY MOUTH DAILY.  90 tablet  2  . FLUoxetine (PROZAC) 10 MG tablet Take 0.5 tablets (5 mg total) by mouth daily.  30 tablet  3  . glyBURIDE (DIABETA) 5 MG tablet Take 1 tablet (5 mg total) by mouth daily with breakfast.  30 tablet  2  . hydrochlorothiazide (MICROZIDE) 12.5 MG capsule Take 1 capsule (12.5 mg total) by mouth daily.  30 capsule  11  . insulin glargine (LANTUS) 100 UNIT/ML injection Inject 55 Units into the skin daily.      . Lancets (FREESTYLE) lancets Use as directed      . LANTUS SOLOSTAR 100 UNIT/ML injection INJECT 50 UNITS INTO THE SKIN DAILY.  15 pen  13  . loratadine (CLARITIN) 10 MG tablet Take 1 tablet (10 mg total) by mouth daily.  30 tablet  5  . lovastatin (MEVACOR) 40 MG tablet TAKE 2 TABLETS (80 MG TOTAL) BY MOUTH DAILY.  30 tablet  5  . metFORMIN (  GLUCOPHAGE-XR) 500 MG 24 hr tablet TAKE 1 TABLET BY MOUTH 3 TIMES A DAY BEFORE MEALS  270 tablet  2  . metoCLOPramide (REGLAN) 5 MG tablet Take 1 tablet (5 mg total) by mouth 3 (three) times daily before meals.  90 tablet  5  . Multiple Vitamin (MULTIVITAMIN) tablet Take 1 tablet by mouth daily.        Marland Kitchen NASONEX 50 MCG/ACT nasal spray INSTILL 2 SPRAYS BY NASAL ROUTE DAILY.  17 g  0  . NOVOLOG FLEXPEN 100 UNIT/ML SOPN FlexPen INJECT 15 UNITS INTO THE SKIN 3 (THREE) TIMES DAILY BEFORE MEALS. PRIOR TO EVENING MEAL.  15 pen  1  . pantoprazole (PROTONIX) 40 MG tablet TAKE 1 TABLET (40 MG TOTAL) BY MOUTH DAILY.  30 tablet  5  . Probiotic Product (PROBIOTIC PEARLS) CAPS Take by mouth.      . vitamin E (NATURAL MIXED TOCOPHEROLS) 400 UNIT capsule Take 800 Units by mouth daily.         No current facility-administered medications on file prior to visit.    ROS: Per HPI   Physical Exam Filed Vitals:   12/26/13 1429  BP: 151/79  Pulse: 93  Temp: 98.8 F (37.1 C)    Physical  Examination: General appearance - alert, well appearing, and in no distress Mental status - normal mood, behavior, speech, dress, motor activity, and thought processes Chest - clear to auscultation, no wheezes, rales or rhonchi, symmetric air entry Heart - normal rate, regular rhythm, normal S1, S2, no murmurs, rubs, clicks or gallops   Assessment/Plan: Please see individual problem list.

## 2013-12-29 NOTE — Assessment & Plan Note (Addendum)
A1c of 7 and reported fasting cbgs near goal of 150. Without hypoglycemic symptoms. Will continue current regimen. Will see in 3 months for f/u of DM. Note patient is followed at the [facility] for her eye exams.

## 2013-12-29 NOTE — Assessment & Plan Note (Signed)
Patient with continued anxiety, though states the ativan works well for her. Continued to have the discussion that she would benefit from an SSRI for this issue, though she wants to have a trial of librium. Will give the patient a trial of librium. If this does not work, will need to consider use of an SSRI for treatment of her anxiety.

## 2013-12-31 MED ORDER — LORAZEPAM 1 MG PO TABS: 1.0000 mg | ORAL_TABLET | Freq: Two times a day (BID) | ORAL | Status: DC | PRN

## 2013-12-31 NOTE — Telephone Encounter (Signed)
Please advise. Rebecca Walter,CMA

## 2013-12-31 NOTE — Telephone Encounter (Signed)
Patient calls again. Only has 2 Ativan left. Needs another RX asap. Please call patient today, she is waiting to hear from [place] even if Dr. Caryl Bis does not respond to msg.

## 2013-12-31 NOTE — Telephone Encounter (Signed)
I have written for a refill on the patients ativan. Please call this in for the patient. Let me know if there are any questions.

## 2014-01-01 NOTE — Telephone Encounter (Signed)
Pharmacy needed clarification on rx since pt states that it wasn't enough medication.  Called and spoke with pharmacist and read off rx written from yesterday.  Pt is aware.  Jazmin Hartsell,CMA

## 2014-01-16 ENCOUNTER — Other Ambulatory Visit: Payer: Self-pay | Admitting: Family Medicine

## 2014-01-18 ENCOUNTER — Other Ambulatory Visit: Payer: Self-pay | Admitting: Family Medicine

## 2014-01-18 ENCOUNTER — Encounter: Payer: Self-pay | Admitting: Family Medicine

## 2014-01-18 ENCOUNTER — Ambulatory Visit (INDEPENDENT_AMBULATORY_CARE_PROVIDER_SITE_OTHER): Payer: Medicare Other | Admitting: Family Medicine

## 2014-01-18 VITALS — BP 162/73 | HR 84 | Temp 97.8°F | Resp 18 | Wt 196.0 lb

## 2014-01-18 DIAGNOSIS — R635 Abnormal weight gain: Secondary | ICD-10-CM

## 2014-01-18 DIAGNOSIS — E1165 Type 2 diabetes mellitus with hyperglycemia: Secondary | ICD-10-CM

## 2014-01-18 DIAGNOSIS — G4700 Insomnia, unspecified: Secondary | ICD-10-CM

## 2014-01-18 DIAGNOSIS — IMO0002 Reserved for concepts with insufficient information to code with codable children: Secondary | ICD-10-CM

## 2014-01-18 DIAGNOSIS — F411 Generalized anxiety disorder: Secondary | ICD-10-CM

## 2014-01-18 DIAGNOSIS — E118 Type 2 diabetes mellitus with unspecified complications: Principal | ICD-10-CM

## 2014-01-18 DIAGNOSIS — I10 Essential (primary) hypertension: Secondary | ICD-10-CM

## 2014-01-18 MED ORDER — LORAZEPAM 1 MG PO TABS: 1.0000 mg | ORAL_TABLET | Freq: Two times a day (BID) | ORAL | Status: DC | PRN

## 2014-01-18 MED ORDER — LORAZEPAM 1 MG PO TABS
1.0000 mg | ORAL_TABLET | Freq: Two times a day (BID) | ORAL | Status: DC | PRN
Start: 2014-01-18 — End: 2014-04-09

## 2014-01-18 NOTE — Patient Instructions (Signed)
Nice to see you again. Please work on your diet by following the information that Dr Jenne Campus gave you and the information below. Please try to stop watching TV prior to going to bed. You can also try taking a warm shower before bed.  Diet Recommendations for Diabetes   Starchy (carb) foods include: Bread, rice, pasta, potatoes, corn, crackers, bagels, muffins, all baked goods.   Protein foods include: Meat, fish, poultry, eggs, dairy foods, and beans such as pinto and kidney beans (beans also provide carbohydrate).   1. Eat at least 3 meals and 1-2 snacks per day. Never go more than 4-5 hours while awake without eating.  2. Limit starchy foods to TWO per meal and ONE per snack. ONE portion of a starchy  food is equal to the following:   - ONE slice of bread (or its equivalent, such as half of a hamburger bun).   - 1/2 cup of a "scoopable" starchy food such as potatoes or rice.   - 1 OUNCE (28 grams) of starchy snack foods such as crackers or pretzels (look on label).   - 15 grams of carbohydrate as shown on food label.  3. Both lunch and dinner should include a protein food, a carb food, and vegetables.   - Obtain twice as many veg's as protein or carbohydrate foods for both lunch and dinner.   - Try to keep frozen veg's on hand for a quick vegetable serving.     - Fresh or frozen veg's are best.  4. Breakfast should always include protein.

## 2014-01-20 ENCOUNTER — Encounter: Payer: Self-pay | Admitting: Family Medicine

## 2014-01-20 DIAGNOSIS — R635 Abnormal weight gain: Secondary | ICD-10-CM | POA: Insufficient documentation

## 2014-01-20 NOTE — Progress Notes (Signed)
Patient ID: Rebecca Walter, female   DOB: [birth year], 73 y.o.   MRN: [id number]  [name], MD Phone: [phone]  Rebecca Walter is a 73 y.o. female who presents today for f/u DM, anxiety, HTN, and to discuss sleep and weight.  DIABETES Disease Monitoring: Blood Sugar ranges-checking, 150's-220's      Visual problems- no Medications: Compliance- taking metformin, glipizide, lantus, novolog Hypoglycemic symptoms- no  HYPERTENSION Disease Monitoring Home BP Monitoring checks at home, usually around 150/70  Chest pain- no    Dyspnea- no Medications Compliance-  taking.  Edema- no  Anxiety: has improved. States ativan is helping. She is not as anxious as previously. States does not feel unsafe with this medication.  Sleep: notes not sleeping well for a long time. States has been on [place] previously. This did not help her. She notes at bedtime she is watching TV. She does not drink caffeine. She does not have a set bed time. She states she get 5-6 hours of sleep a night.  Weight: patient states she has gained weight. She notes that her diet is not good. She eats biscuits and potatoes a lot. She also notes eating lots of chicken. She eats frozen veggies at home, though notes eating canned green beans. She notes eating at [facility] and getting the grilled chicken, but not healthy sides. She does not exercise related to arthritic knees.  The following portions of the patient's history were reviewed and updated as appropriate: allergies, current medications, past medical history, family and social history, and problem list.  Patient is a non-smoker.  Past Medical History  Diagnosis Date  . Hypertension   . Anxiety   . Gout   . Hyperlipidemia   . Osteopenia   . Arthritis   . GERD (gastroesophageal reflux disease)   . Diabetes mellitus     History  Smoking status  . Never Smoker   Smokeless tobacco  . Never Used    Family History  Problem Relation Age of Onset  .  Stroke Mother   . Heart disease Father   . Stroke Father   . Hypertension Father   . Cancer Sister   . Cancer Brother   . Heart disease Brother   . Diabetes Maternal Uncle     Current Outpatient Prescriptions on File Prior to Visit  Medication Sig Dispense Refill  . ACCU-CHEK AVIVA PLUS test strip Check blood sugar first  thing in the morning before eating,after lunch and  after dinner total of 3  times daily  300 each  6  . allopurinol (ZYLOPRIM) 100 MG tablet TAKE 1 TABLET (100 MG TOTAL) BY MOUTH DAILY.  30 tablet  11  . amLODipine (NORVASC) 10 MG tablet TAKE 1 TABLET (10 MG TOTAL) BY MOUTH DAILY.  30 tablet  11  . aspirin 81 MG chewable tablet Chew 1 tablet (81 mg total) by mouth daily.  30 tablet  5  . B-D ULTRAFINE III SHORT PEN 31G X 8 MM MISC USE AS DIRECTED  100 each  5  . chlordiazePOXIDE (LIBRIUM) 5 MG capsule Take 1 capsule (5 mg total) by mouth 3 (three) times daily as needed for anxiety.  45 capsule  0  . COLCRYS 0.6 MG tablet TAKE 1 TABLET BY MOUTH EVERY DAY  30 tablet  5  . DEMADEX 20 MG tablet TAKE 1 TABLET (20 MG TOTAL) BY MOUTH DAILY.  90 tablet  2  . FLUoxetine (PROZAC) 10 MG tablet Take 0.5 tablets (5  mg total) by mouth daily.  30 tablet  3  . glyBURIDE (DIABETA) 5 MG tablet Take 1 tablet (5 mg total) by mouth daily with breakfast.  30 tablet  2  . hydrochlorothiazide (MICROZIDE) 12.5 MG capsule Take 1 capsule (12.5 mg total) by mouth daily.  30 capsule  11  . insulin glargine (LANTUS) 100 UNIT/ML injection Inject 55 Units into the skin daily.      . Lancets (FREESTYLE) lancets Use as directed      . LANTUS SOLOSTAR 100 UNIT/ML injection INJECT 50 UNITS INTO THE SKIN DAILY.  15 pen  13  . loratadine (CLARITIN) 10 MG tablet Take 1 tablet (10 mg total) by mouth daily.  30 tablet  5  . lovastatin (MEVACOR) 40 MG tablet TAKE 2 TABLETS (80 MG TOTAL) BY MOUTH DAILY.  30 tablet  5  . metFORMIN (GLUCOPHAGE-XR) 500 MG 24 hr tablet TAKE 1 TABLET BY MOUTH 3 TIMES A DAY BEFORE  MEALS  270 tablet  2  . metoCLOPramide (REGLAN) 5 MG tablet Take 1 tablet (5 mg total) by mouth 3 (three) times daily before meals.  90 tablet  5  . Multiple Vitamin (MULTIVITAMIN) tablet Take 1 tablet by mouth daily.        Marland Kitchen NASONEX 50 MCG/ACT nasal spray INSTILL 2 SPRAYS BY NASAL ROUTE DAILY.  17 g  0  . NOVOLOG FLEXPEN 100 UNIT/ML FlexPen INJECT 15 UNITS INTO THE SKIN 3 (THREE) TIMES DAILY BEFORE MEALS. PRIOR TO EVENING MEAL.  15 pen  0  . pantoprazole (PROTONIX) 40 MG tablet TAKE 1 TABLET (40 MG TOTAL) BY MOUTH DAILY.  30 tablet  5  . Probiotic Product (PROBIOTIC PEARLS) CAPS Take by mouth.      . vitamin E (NATURAL MIXED TOCOPHEROLS) 400 UNIT capsule Take 800 Units by mouth daily.         No current facility-administered medications on file prior to visit.    ROS: Per HPI   Physical Exam Filed Vitals:   01/18/14 1423  BP: 162/73  Pulse: 84  Temp: 97.8 F (36.6 C)  Resp: 18    Physical Examination: General appearance - alert, well appearing, and in no distress Mental status - normal mood, behavior, speech, dress, motor activity, and thought processes Chest - clear to auscultation, no wheezes, rales or rhonchi, symmetric air entry Heart - normal rate, regular rhythm, normal S1, S2, no murmurs, rubs, clicks or gallops Extremities - no pedal edema noted   Assessment/Plan: Please see individual problem list.  I have spent >25 minutes in the care of this patient with >50% spent in counseling/coordination of care regarding DM, HTN, anxiety, sleep, and weight.

## 2014-01-20 NOTE — Assessment & Plan Note (Signed)
CBGs stable, though not quite where we would like them to be. A1c at goal. Discussed dietary changes to be made as she has a poor understanding of what her diet should be to help manage her DM. Will continue current medication regimen. F/u in one month.

## 2014-01-20 NOTE — Assessment & Plan Note (Signed)
Discussed need for better sleep hygiene. Given strategies for this.

## 2014-01-20 NOTE — Assessment & Plan Note (Signed)
Slightly above goal today, though has recently been at goal. Will continue to monitor. Will have patient record BPs at home. F/u in one month.

## 2014-01-20 NOTE — Assessment & Plan Note (Signed)
Stable. Continue current ativan regimen.

## 2014-01-20 NOTE — Assessment & Plan Note (Signed)
Discussed dietary changes needed to be made to help with weight loss. Given DM diet handout. Will see back in the next couple of weeks to discuss her knee pain as this is keeping her from exercising.

## 2014-01-30 ENCOUNTER — Ambulatory Visit: Payer: Medicare Other | Admitting: Family Medicine

## 2014-01-30 ENCOUNTER — Telehealth: Payer: Self-pay | Admitting: Family Medicine

## 2014-01-30 NOTE — Telephone Encounter (Signed)
Needs another blood preasure medication [facility] says they will not cover it any more Please advise

## 2014-01-31 NOTE — Telephone Encounter (Signed)
[phone] [place] [facility] requires authorization as to why pt needs to take 30 mg of this medication.  Patient needs this done if you want her to continue this medication.  She would like it done asap because there can be  24 hour waiting period on the insurance's decision.  Jazmin Hartsell,CMA  Member id #[id number]

## 2014-01-31 NOTE — Telephone Encounter (Signed)
Could you please find out what they will cover in the ACE inhibitor category, so I can make the appropriate change.

## 2014-01-31 NOTE — Telephone Encounter (Signed)
Pt states that she received a letter that they will no longer be covering her benazepril but there was no end date.  Pt has enough medication to last until her next appt with you.  Advised pt to call and speak with her insurance in order to get right medication order when the time is right.  Informed pt that I was unable to find out what her covered medication will be since right now the benazepril is covered. Jazmin Hartsell,CMA

## 2014-02-14 ENCOUNTER — Ambulatory Visit: Payer: Medicare Other | Admitting: Family Medicine

## 2014-02-18 MED ORDER — BENAZEPRIL HCL 10 MG PO TABS: 20.0000 mg | ORAL_TABLET | Freq: Every day | ORAL | Status: DC

## 2014-02-18 NOTE — Telephone Encounter (Signed)
I did not receive a PA. I sent in a refill for 20 mg daily of this medication. This is a more typical dosage. It sounds as though the issue previously was related to the 30 mg dosage of the medication. If she is unable to get this medication filled after this change. I will call medicare for authorization of this medication.

## 2014-02-18 NOTE — Telephone Encounter (Signed)
Fax [phone].  Pt wants to know if we can fax over a letter stating why she needs to be taking 30mg .  She is weary of going back to 20mg  since the 30mg  has worked well.  It just needs to say that it is medically necessary for her to be on this dose due to having tried other medications with no success.  Pt would like to know when this is sent and when it is approved.  Isabel Ardila,CMA

## 2014-02-18 NOTE — Telephone Encounter (Signed)
Please call Ms. Jutte back asap.   Still needing medication refill.  For her benazepril.  Want to know status from insurance company

## 2014-02-18 NOTE — Telephone Encounter (Signed)
Letter filled out and routed to admin pool to be faxed. Please let patient know this. Thanks.

## 2014-02-18 NOTE — Telephone Encounter (Signed)
Will forward to MD to see if he received a PA for this or if he called the number listed for approval. Ayonna Speranza,CMA

## 2014-02-19 NOTE — Telephone Encounter (Signed)
Pt is aware that letter was faxed to insurance. Jazmin Hartsell,CMA

## 2014-02-26 ENCOUNTER — Other Ambulatory Visit: Payer: Self-pay | Admitting: Family Medicine

## 2014-02-27 ENCOUNTER — Ambulatory Visit: Payer: Medicare Other | Admitting: Family Medicine

## 2014-03-18 ENCOUNTER — Other Ambulatory Visit: Payer: Self-pay | Admitting: Family Medicine

## 2014-04-09 ENCOUNTER — Encounter: Payer: Self-pay | Admitting: Family Medicine

## 2014-04-09 ENCOUNTER — Ambulatory Visit (INDEPENDENT_AMBULATORY_CARE_PROVIDER_SITE_OTHER): Payer: Medicare Other | Admitting: Family Medicine

## 2014-04-09 VITALS — BP 146/56 | HR 81 | Temp 99.6°F | Ht 60.0 in | Wt 191.0 lb

## 2014-04-09 DIAGNOSIS — E118 Type 2 diabetes mellitus with unspecified complications: Principal | ICD-10-CM

## 2014-04-09 DIAGNOSIS — I10 Essential (primary) hypertension: Secondary | ICD-10-CM

## 2014-04-09 DIAGNOSIS — IMO0002 Reserved for concepts with insufficient information to code with codable children: Secondary | ICD-10-CM

## 2014-04-09 DIAGNOSIS — E1165 Type 2 diabetes mellitus with hyperglycemia: Secondary | ICD-10-CM

## 2014-04-09 DIAGNOSIS — M62838 Other muscle spasm: Secondary | ICD-10-CM

## 2014-04-09 LAB — POCT GLYCOSYLATED HEMOGLOBIN (HGB A1C): Hemoglobin A1C: 6.5

## 2014-04-09 MED ORDER — BACLOFEN 5 MG HALF TABLET: 5.0000 mg | ORAL_TABLET | Freq: Three times a day (TID) | ORAL | Status: DC

## 2014-04-09 MED ORDER — LORAZEPAM 1 MG PO TABS: 1.0000 mg | ORAL_TABLET | Freq: Two times a day (BID) | ORAL | Status: DC | PRN

## 2014-04-09 NOTE — Patient Instructions (Signed)
Nice to see you. Your neck pain is likely related to a spasm. We will try baclofen which is a muscle relaxer. This can make you drowsy, so be careful after you take this. You can keep taking the ibuprofen 400 mg daily as needed for the pain. Please continue the same blood pressure medications and diabetes medications.  Soft Tissue Injury of the Neck  A soft tissue injury of the neck needs medical care right away. These injuries are often caused by a direct hit to the neck. Some injuries do not break the skin (blunt injury). Some injuries do break the skin (penetrating injury) and create an open wound. You may feel fine at first, but the puffiness (swelling) in your throat can slowly make it harder to breathe. This could cause serious or life-threatening injury. There could be damage to major blood vessels and nerves in the neck. Neck injuries need to be checked by a doctor. HOME CARE  If the skin was broken, keep the area clean and dry. Care for your wound as told by your doctor.  Follow your doctor's diet advice.  Follow your doctor's advice about using your voice.  Only take medicines as told by your doctor.  Keep your head and neck raised (elevated). Do this while you sleep, too. GET HELP RIGHT AWAY IF:  Your voice gets weaker.  Your puffiness or bruising does not get better.  You have problems with your medicines.  You see fluid coming from the wound.  Your pain gets worse, or you have trouble swallowing.  You cough up blood.  You have trouble breathing.  You start to drool.  You start throwing up (vomiting).  You have new puffiness in the neck or face.  You have a temperature by mouth above 102 F (38.9 C), not controlled by medicine. MAKE SURE YOU:  Understand these instructions.  Will watch your condition.  Will get help right away if you are not doing well or get worse. Document Released: 03/18/2011 Document Revised: 02/28/2012 Document Reviewed:  03/18/2011 [facility] Patient Information 2014 [place], [place].

## 2014-04-09 NOTE — Progress Notes (Signed)
Patient ID: Rebecca Walter, female   DOB: March 21, 1941, 73 y.o.   MRN: [id number]  [name], MD Phone: [phone]  Rebecca Walter is a 73 y.o. female who presents today for f/u.  DIABETES Disease Monitoring: Blood Sugar ranges-126-218 Polyuria/phagia/dipsia- no      Medications: Compliance- taking Hypoglycemic symptoms- none  HYPERTENSION Disease Monitoring Home BP Monitoring checking, 150's/70's Chest pain- no    Dyspnea- no Medications Compliance-  taking.  Edema- none  Neck pain: reports left sided ache in her neck. Intermittent. Worsened by movement. No associated numbness or weakness. States ibuprofen 400 mg once a day prn helps with the pain.  Patient is a nonsmoker.   ROS: Per HPI   Physical Exam Filed Vitals:   04/09/14 1343  BP: 146/56  Pulse: 81  Temp: 99.6 F (37.6 C)    Gen: Well NAD HEENT: EOMI,  MMM Lungs: CTABL Nl WOB Heart: RRR no MRG Exts: Non edematous BL  LE, warm and well perfused.  MSK: left neck in distribution of trapezius with tenderness to palpation and spasm, no midline tenderness, no tenderness on the right, no overlying erythema Neuro: CN 2-12 intact, 5/5 strength in bilateral deltoids, biceps, triceps, grip, hip flexors, quads, hamstrings, plantar and dorsiflexion, sensation to light touch intact in bilateral upper and LE   Assessment/Plan: Please see individual problem list.

## 2014-04-10 DIAGNOSIS — M62838 Other muscle spasm: Secondary | ICD-10-CM | POA: Insufficient documentation

## 2014-04-10 NOTE — Assessment & Plan Note (Signed)
At goal. Will continue current medication regimen. F/u in 3 months. 

## 2014-04-10 NOTE — Assessment & Plan Note (Signed)
At goal. Will continue current medication regimen. F/u in 3 months.

## 2014-04-10 NOTE — Assessment & Plan Note (Signed)
Patient with noted spasm in the mid-proximal portion of the trapezius. Discussed limiting use of ibuprofen. Will give trial of baclofen for this to take only with pain. Can use heat on the area. Given return precautions. F/u prn.

## 2014-04-16 ENCOUNTER — Other Ambulatory Visit: Payer: Self-pay | Admitting: Family Medicine

## 2014-04-17 ENCOUNTER — Other Ambulatory Visit: Payer: Self-pay | Admitting: Family Medicine

## 2014-04-17 NOTE — Telephone Encounter (Signed)
Patient called requesting a RUSH on the test strips. She only has a few left before she will be out.

## 2014-04-21 ENCOUNTER — Other Ambulatory Visit: Payer: Self-pay | Admitting: Family Medicine

## 2014-04-22 ENCOUNTER — Telehealth: Payer: Self-pay | Admitting: Family Medicine

## 2014-04-22 MED ORDER — BENAZEPRIL HCL 20 MG PO TABS: 20.0000 mg | ORAL_TABLET | Freq: Every day | ORAL | Status: DC

## 2014-04-22 NOTE — Telephone Encounter (Signed)
Spoke with patient and she states that she is supposed to be taking 30mg  (3-10mg  tabs) daily.  Please advise if this is something I need to call in and change. Thanks [employer]

## 2014-04-22 NOTE — Telephone Encounter (Signed)
The patient is not supposed to take the medication three times a day. She is supposed to take 20 mg daily. I will send in a prescription for a 20 mg tablet to be taken daily. Please inform the patient of this. If her insurance will not cover this, she should call back to let us know.

## 2014-04-22 NOTE — Telephone Encounter (Signed)
Pt called because the prescription for Benazepril that was called in needs to say 3 times a day and 90 qty on the insurance will not pay. Please send a new prescription. jw

## 2014-04-22 NOTE — Telephone Encounter (Signed)
Will forward to MD. Jazmin Hartsell,CMA  

## 2014-04-23 ENCOUNTER — Encounter: Payer: Self-pay | Admitting: Family Medicine

## 2014-04-23 ENCOUNTER — Telehealth: Payer: Self-pay | Admitting: Family Medicine

## 2014-04-23 ENCOUNTER — Ambulatory Visit (INDEPENDENT_AMBULATORY_CARE_PROVIDER_SITE_OTHER): Payer: Medicare Other | Admitting: Family Medicine

## 2014-04-23 VITALS — BP 162/77 | HR 85 | Temp 99.0°F | Ht 60.0 in | Wt 192.0 lb

## 2014-04-23 DIAGNOSIS — M25519 Pain in unspecified shoulder: Secondary | ICD-10-CM

## 2014-04-23 DIAGNOSIS — M25511 Pain in right shoulder: Secondary | ICD-10-CM

## 2014-04-23 MED ORDER — INSULIN PEN NEEDLE 31G X 8 MM MISC: Status: DC

## 2014-04-23 MED ORDER — BENAZEPRIL HCL 10 MG PO TABS: 30.0000 mg | ORAL_TABLET | Freq: Every day | ORAL | Status: DC

## 2014-04-23 MED ORDER — GLUCOSE BLOOD VI STRP: ORAL_STRIP | Status: DC

## 2014-04-23 NOTE — Patient Instructions (Signed)
Nice to see you. Please do the attached exercises to help with your shoulder pain. You can use ice and heat on the affected area alternating to help with the pain. Please use ibuprofen sparingly to help with the pain. You can take this 400 mg up to twice a day for the pain.

## 2014-04-23 NOTE — Telephone Encounter (Signed)
Pt called about her test strips. Rx was written for 300 strips with one refill.   After 15 minutes of trying to explain, this would cover 6 months, she could not understand it. She said she was suppose to have 3 refills. She has a hard time understanding change.

## 2014-04-23 NOTE — Telephone Encounter (Signed)
LM for patient explaining why so many test strips were written.  3 test strips a day x 31 days in a month is 93 strips.  3 months worth of these are 273 strips.  They only come 100 in a box so he wrote for 300.  Seamus Warehime,CMA

## 2014-04-25 DIAGNOSIS — M25511 Pain in right shoulder: Secondary | ICD-10-CM | POA: Insufficient documentation

## 2014-04-25 NOTE — Progress Notes (Signed)
Patient ID: Rebecca Walter, female   DOB: 1941/06/02, 73 y.o.   MRN: [id number]  [name], MD Phone: [phone]  Rebecca Walter is a 73 y.o. female who presents today for new complaint.  Right shoulder pain: started 8 days ago. No injury noted. Cold temp makes it worse. Reaching behind her back makes it worse. She notes most of the pain is at the insertion site of the deltoid muscle. She has tried icy hot and heat on it. She has taken ibuprofen with minimal benefit.. She states this has been getting better. She has not noted any weakness or numbness in her right arm.  Patient is a nonsmoker.   ROS: Per HPI   Physical Exam Filed Vitals:   04/23/14 1402  BP: 162/77  Pulse: 85  Temp: 99 F (37.2 C)    Gen: Well NAD MSK: shoulder examination, right shoulder with good ROM, no obvious swelling or erythema, there is tenderness to palpation of the [facility] joint, though no other tenderness noted. No pain with internal or external rotation, neg drop arm test, neg empty can, neg speeds, left shoulder with good ROM, no obvious swelling or erythema, no tenderness noted, No pain with internal or external rotation, neg drop arm test, neg empty can, neg speeds Neuro: bilateral deltoid, biceps, triceps, and grip 5/5 strength, sensation to light touch intact, 2+ biceps and brachoradialis reflexes Exts: Non edematous BL  LE, warm and well perfused.    Assessment/Plan: Please see individual problem list.

## 2014-04-25 NOTE — Assessment & Plan Note (Addendum)
Patient with new onset of pain in her right shoulder. Likely represents an [facility] joint arthritis given tenderness on palpation in that area vs an impingement syndrome given pain with reaching behind her back. No signs of rotator cuff tear on exam. Discussed that the best way to treat this issue is by home exercises vs PT. Patient can alternate heat and ice on this area. Ibuprofen to be used sparingly as outlined in AVS. Patient decided on home exercises at this time. If not improving with this will need to consider PT and X-ray of the shoulder. F/u in one month.

## 2014-04-26 ENCOUNTER — Telehealth: Payer: Self-pay | Admitting: Family Medicine

## 2014-04-26 DIAGNOSIS — M25511 Pain in right shoulder: Secondary | ICD-10-CM

## 2014-04-26 NOTE — Telephone Encounter (Signed)
Order placed

## 2014-04-26 NOTE — Telephone Encounter (Signed)
Pt called and would like orders for a home health nurse and PT at home with [facility] [phone] and the directors name is Gerda Diss. Blima Rich

## 2014-04-26 NOTE — Addendum Note (Signed)
Addended by: Leone Haven on: 04/26/2014 04:02 PM   Modules accepted: Orders

## 2014-04-26 NOTE — Telephone Encounter (Signed)
Will forward to CSW norma wilson. Katheryn Culliton,CMA

## 2014-04-26 NOTE — Telephone Encounter (Signed)
I only see an order for [facility] PT I need an order for RN as well.  Thanks,   Hunt Oris, MSW, [place]

## 2014-04-26 NOTE — Telephone Encounter (Signed)
Referral placed. Please use the patients preferred home care group. Thanks.

## 2014-05-02 ENCOUNTER — Other Ambulatory Visit: Payer: Self-pay | Admitting: *Deleted

## 2014-05-02 ENCOUNTER — Other Ambulatory Visit: Payer: Self-pay | Admitting: Family Medicine

## 2014-05-02 MED ORDER — BACLOFEN 5 MG HALF TABLET: 5.0000 mg | ORAL_TABLET | Freq: Three times a day (TID) | ORAL | Status: DC | PRN

## 2014-05-13 ENCOUNTER — Other Ambulatory Visit: Payer: Self-pay | Admitting: Family Medicine

## 2014-05-21 ENCOUNTER — Ambulatory Visit
Admission: RE | Admit: 2014-05-21 | Discharge: 2014-05-21 | Disposition: A | Discharge status: Home / Self Care | Payer: Medicare Other | Source: Ambulatory Visit | Attending: Family Medicine | Admitting: Family Medicine

## 2014-05-21 ENCOUNTER — Encounter: Payer: Self-pay | Admitting: Family Medicine

## 2014-05-21 ENCOUNTER — Ambulatory Visit (INDEPENDENT_AMBULATORY_CARE_PROVIDER_SITE_OTHER): Payer: Medicare Other | Admitting: Family Medicine

## 2014-05-21 VITALS — BP 149/77 | HR 87 | Temp 98.5°F | Ht 60.0 in | Wt 194.0 lb

## 2014-05-21 DIAGNOSIS — M25511 Pain in right shoulder: Secondary | ICD-10-CM

## 2014-05-21 DIAGNOSIS — M25519 Pain in unspecified shoulder: Secondary | ICD-10-CM

## 2014-05-21 DIAGNOSIS — F411 Generalized anxiety disorder: Secondary | ICD-10-CM

## 2014-05-21 MED ORDER — LORAZEPAM 1 MG PO TABS: 1.0000 mg | ORAL_TABLET | Freq: Two times a day (BID) | ORAL | Status: DC | PRN

## 2014-05-21 MED ORDER — BENAZEPRIL HCL 10 MG PO TABS: 30.0000 mg | ORAL_TABLET | Freq: Every day | ORAL | Status: DC

## 2014-05-21 MED ORDER — TETANUS-DIPHTH-ACELL PERTUSSIS 5-2.5-18.5 LF-MCG/0.5 IM SUSP: 0.5000 mL | Freq: Once | INTRAMUSCULAR | Status: DC

## 2014-05-21 NOTE — Patient Instructions (Signed)
Nice to see you.  I'm glad your shoulder is feeling better. Please continue your physical therapy.  Go to the radiology department in the hospital for your x-ray.

## 2014-05-22 NOTE — Progress Notes (Signed)
Patient ID: Rebecca Walter, female   DOB: [birth year], 73 y.o.   MRN: [id number]  [name], MD Phone: [phone]  Rebecca Walter is a 73 y.o. female who presents today for f/u.  Right shoulder pain: notes she has had 2 PT sessions. She is able to use her arm more. Still has some pains in the same spot, though not as bad as previously. She is more flexible. Using ice packs as well. Ibuprofen helping some. No numbness or weakness.  Anxiety: states ativan is working well for her. States she needs refills on this.   Patient is a nonsmoker.   ROS: Per HPI   Physical Exam Filed Vitals:   05/21/14 1337  BP: 149/77  Pulse: 87  Temp: 98.5 F (36.9 C)    Gen: Well NAD MSK: right shoulder without swelling, no tenderness to palpation, good ROM. There is minimal pain with internal rotation. No pain with external rotation, neg drop arm test, neg empty can, neg speeds. She is able to reach further behind her back without pain than previous exam.   Assessment/Plan: Please see individual problem list.

## 2014-05-22 NOTE — Assessment & Plan Note (Signed)
Improved from previously with PT. PT requests that XR be obtained. This revealed OA changes. Will continue with PT at this time. Consider injection if not continuing to improve after one month. F/u prn.

## 2014-05-22 NOTE — Assessment & Plan Note (Signed)
Stable on current ativan regimen. Refills given for 3 months. F/u in 3 months.

## 2014-05-23 ENCOUNTER — Telehealth: Payer: Self-pay | Admitting: Family Medicine

## 2014-05-23 NOTE — Telephone Encounter (Signed)
Spoke with patient and gave the below info.   Pt states that she had to cancel PT today because she "hurt so bad".   Said that she is using Aspercream and Icy hot.  Wants to know if Dr. Caryl Bis thinks that she really needs PT, advised several times that he did want her to continue, but will ask him.  Pt is aware that if he wants her to continue there will be no callback, but if he wanted her to stop we would call her back and discuss it. Laban Emperor Fleeger

## 2014-05-23 NOTE — Telephone Encounter (Signed)
Patient should continue PT for this issue.

## 2014-05-23 NOTE — Telephone Encounter (Signed)
Patient would like her xray result from 05/21/14.

## 2014-05-23 NOTE — Telephone Encounter (Signed)
Her X-ray revealed moderate arthritic changes. She should continue with physical therapy. Please inform the patient of this.

## 2014-05-24 ENCOUNTER — Telehealth: Payer: Self-pay | Admitting: *Deleted

## 2014-05-24 NOTE — Telephone Encounter (Signed)
LM for patient with information from MD and Constance Holster.  Rebecca Walter,CMA

## 2014-05-24 NOTE — Telephone Encounter (Signed)
Message copied by Valerie Roys on Fri May 24, 2014  3:13 PM ------      Message from: Caryl Bis, ERIC G      Created: Fri May 24, 2014  2:52 PM      Regarding: FW: Patient requesting home services       Could you please call Ms Montone to inform her of Norma's comment seen below. Patient was requesting help at home with preparation of meals and cleaning the house. Evidently at best we would be able to get her help a couple of times a week for a couple of weeks. It sounds like the best option is for her to hire someone to help her in the home. Thanks.            Randall Hiss                  ----- Message -----         From: Hunt Oris, LCSW         Sent: 05/22/2014   4:19 PM           To: Leone Haven, MD      Subject: RE: Patient requesting home services                     Hi Dr. Caryl Bis,            The pt already has (you put orders in last month) for a [facility] RN&PT. Unfortunately, neither will do house work or prepare meals. You can place an order for a [facility] aide and I can send it off however if they approve it it will be very short term like maybe 2x a week for 2 weeks. She will need to hire someone privately since she does not have Medicaid.             I will call her tomorrow with this information.            Thanks, Constance Holster      ----- Message -----         From: Leone Haven, MD         Sent: 05/22/2014   4:02 PM           To: Hunt Oris, LCSW      Subject: Patient requesting home services                         Hi Constance Holster,            I saw this patient in clinic yesterday and she mentioned needing help around her home with house cleaning and preparing meals. She specifically stated she did not need help with dressing and bathing. She is elderly and uses a cane to walk and just has some difficulty accomplishing these things on her own. Not sure if this is something that is appropriate for a [facility] RN and wanted to run it by you. Let me know if you need more details and what you  think. Thanks.            Eric             ------

## 2014-06-11 ENCOUNTER — Other Ambulatory Visit: Payer: Self-pay | Admitting: Family Medicine

## 2014-06-18 ENCOUNTER — Ambulatory Visit (INDEPENDENT_AMBULATORY_CARE_PROVIDER_SITE_OTHER): Payer: Medicare Other | Admitting: Family Medicine

## 2014-06-18 ENCOUNTER — Encounter: Payer: Self-pay | Admitting: Family Medicine

## 2014-06-18 VITALS — BP 159/79 | HR 92 | Temp 98.2°F | Resp 18 | Wt 189.0 lb

## 2014-06-18 DIAGNOSIS — I10 Essential (primary) hypertension: Secondary | ICD-10-CM

## 2014-06-18 DIAGNOSIS — M62838 Other muscle spasm: Secondary | ICD-10-CM

## 2014-06-18 MED ORDER — BACLOFEN 10 MG PO TABS: 10.0000 mg | ORAL_TABLET | Freq: Three times a day (TID) | ORAL | Status: DC | PRN

## 2014-06-18 MED ORDER — TRAMADOL HCL 50 MG PO TABS: 25.0000 mg | ORAL_TABLET | Freq: Four times a day (QID) | ORAL | Status: DC | PRN

## 2014-06-18 NOTE — Assessment & Plan Note (Signed)
A: Uncertain etiology for neck pain / spasm, but no frank deformity noted. Possible rotator cuff involvement, though ROM on exam and complaint / description of pain sounds more like muscle spasm / tightness causing pain with ROM, rather than frank rotator cuff pathology causing reduced ROM or pain. Also some anterior shoulder tenderness raising possibility for AC-joint involvement. No red flags or radiculopathy-type symptoms. Pt reports only some improvement with conservative measures and muscle relaxers in the past, but medications did help.  P: Rx for baclofen (increase from previously-used 5 mg to 10 mg) TID for spasm. plus tramadol (low dose) up to q6h PRN for pain. Continue conservative measures, also; strongly recommended warmth and OTC muscle cream such as Icy-Hot to be used in conjunction with Rx's. Explained to pt that the goal is for medication and heat / creams to be used to loosen / relax muscles which will allow her to stretch and "work them out" without being frankly painful, so that the pain medication and relaxant will not be required, later. Pt stated understanding. F/u with PCP Dr. Caryl Bis in about 2 weeks; consider referral to sports medicine or orthopedics, if no improvement with current measures.

## 2014-06-18 NOTE — Assessment & Plan Note (Signed)
BP elevated at home and in clinic today, apparently secondary to neck pain, as pt reports compliance with medications and has been at goal in the recent past. No changes to HTN Rx's today; continue Norvasc, Lotensin, HCTZ. F/u with PCP in 2 weeks; if still elevated would consider increasing ACE and HCTZ.

## 2014-06-18 NOTE — Patient Instructions (Signed)
Thank you for coming in, today!  I think your neck tightness is causing a lot of your pain. I think your pain is causing your blood pressure to be up.  I want you to take tramadol 25 - 50 mg (one half or one) tablet up to 4 times a day, for pain. I want you to take baclofen 10 mg (one whole tablet) three times a day, for spasm. These medicines will help loosen you up your muscles.  Using the medicines and Icy Hot and warm compresses to loosen your muscles up will let you stretch. A couple of times every hour you're awake, move your neck and shoulders around to loosen and stretch the muscles.  Try all of this for about 2 weeks, then come back to see Dr. Caryl Bis to see if anything is better. If things get worse or if they're not helped at all, come back sooner if you need.  Please feel free to call with any questions or concerns at any time, at [phone]. --Dr. Venetia Maxon

## 2014-06-18 NOTE — Progress Notes (Signed)
   Subjective:    Patient ID: Rebecca Walter, female    DOB: 1941-09-10, 73 y.o.   MRN: [id number]  HPI: Pt presents to clinic for SDA for neck pain and elevated BP, which she attributes to the neck pain.  Neck pain - pt describes neck pain on the left side that's present for about weeks - pt states pain was present one day when she woke up; she denies twisting / turning / injury - pt saw Dr. Caryl Bis about 1 month ago for similar pain and was given muscle relaxers, which she is not sure how much it helped - ibuprofen and Tylenol have not helped at all; she has used Aspercream with some relief and has used Icy-Hot in the past - pain is throbbing in nature, worse with movement and is associated with headache but NOT with any radiculopathy-type symptoms  Elevated BP - pt carries dx of HTN and takes benazepril, HCTZ, and amlodipine chronically - pt reports she checks her BP at home intermittently, daily when she is not feeling well, like she feels currently - BP is normally 150 or so over 70's; more recently with pain as above has been upwards of 170's / 80's or higher - she has some headache with it but no chest pain or SOB  Review of Systems: As above. She denies fever / chills, SOB, cough, N/V.     Objective:   Physical Exam BP 159/79  Pulse 92  Temp(Src) 98.2 F (36.8 C) (Oral)  Resp 18  Wt 189 lb (85.73 kg)  SpO2 95% Gen: elderly adult female in NAD, pleasant and cooperative HEENT: [place]/AT, EOMI, PERRLA  Neck and shoulders normal in appearance to inspection; no masses appreciated  Neck ROM decreased mostly with turning and lateral bending, secondary to increased pain MSK: Moderate spasm noted to left neck paraspinal muscles and trapezius  Diffuse soft-tissue and bony tenderness in posterior neck and anterior clavicular / supraclavicular region on left  ROM through rotator cuff movements intact, though some increased muscle tightness / pain in neck with active ROM through shoulder  arcs Neuro: strength 5/5 in upper extremities bilaterally  Otherwise alert / intact Cardio: RRR, no definite murmur appreciated Pulm: CTAB, no wheezes     Assessment & Plan:

## 2014-06-22 ENCOUNTER — Telehealth: Payer: Self-pay | Admitting: Family Medicine

## 2014-06-22 NOTE — Telephone Encounter (Signed)
After hours line  Pt calling in stating that she feels funny after she takes tramadol. She took a whole one earlier in teh week and felt high and cut it in half, which is still making her feel high. She states that she continues to have neck pain and icy/hot is helping.   SHe has been taking ibuprofen for a while she states. I advised that and/or tylenol and to stop the tramadol. Advised her to make SDA Monday if she continues to have pain. SHe denies trouble walking or weakness.   Laroy Apple, MD [place] Resident, PGY-3 06/22/2014, 1:10 PM

## 2014-06-24 ENCOUNTER — Telehealth: Payer: Self-pay | Admitting: Family Medicine

## 2014-06-24 ENCOUNTER — Ambulatory Visit: Payer: Medicare Other | Admitting: Family Medicine

## 2014-06-24 MED ORDER — BACLOFEN 10 MG PO TABS: 10.0000 mg | ORAL_TABLET | Freq: Three times a day (TID) | ORAL | Status: DC | PRN

## 2014-06-24 NOTE — Telephone Encounter (Signed)
If pt is continuing to have problems / pain that is requiring three-times-daily-every-day use of that medication, she should return to clinic to see her PCP, Dr. Caryl Bis. I can't refill that medication early and I think she should be seen again before anything else is prescribed. Please call her to let her know. Also routed to Dr. Leonia Corona. Thanks! --CMS

## 2014-06-24 NOTE — Telephone Encounter (Signed)
Spoke with patient and she is aware of message.  Advised her that she needs to speak with someone at the pharmacy regarding her refill.  She already has a follow up appt scheduled with PCP 07-09-14. Jazmin Hartsell,CMA

## 2014-06-24 NOTE — Telephone Encounter (Signed)
Patient calls about her script for Baclofen. States she will run out soon because there was only 20 tabs prescribed and it is too early to get refill.  She is taking 3 daily. She states that they are indeed working for her muscle spasms. Please advise.

## 2014-06-24 NOTE — Telephone Encounter (Signed)
Patient called back and states that she can't get it refilled til later in July.  Would like to know if she can get another rx for this?  She is feeling better with this medication and doesn't want the pain to return. Sloane Junkin,CMA

## 2014-06-24 NOTE — Telephone Encounter (Signed)
Pt called because the muscle relaxer's Baclofen 10 mg  that she was given states 3 times as needed. She is needing them everyday. She was given a qty of 20 with one refill that can not be filled until 07/18/14. She has an appointment scheduled 07/09/14 with Dr. Caryl Bis. She was hoping that she can get enough medication to last until her appointment or can she come in to see him sooner. Please call and let her know what she should do. jw

## 2014-06-24 NOTE — Telephone Encounter (Signed)
Refill sent in. Patient needs to keep her appointment at the end of the month. Please advise her of this. Thanks.

## 2014-06-25 ENCOUNTER — Other Ambulatory Visit: Payer: Self-pay | Admitting: Family Medicine

## 2014-06-25 NOTE — Telephone Encounter (Signed)
Pt informed and agreeable. Roxann Vierra Dawn  

## 2014-06-26 ENCOUNTER — Ambulatory Visit: Payer: Medicare Other | Admitting: Family Medicine

## 2014-07-09 ENCOUNTER — Encounter: Payer: Self-pay | Admitting: Family Medicine

## 2014-07-09 ENCOUNTER — Ambulatory Visit (INDEPENDENT_AMBULATORY_CARE_PROVIDER_SITE_OTHER): Payer: Medicare Other | Admitting: Family Medicine

## 2014-07-09 VITALS — BP 145/67 | HR 80 | Temp 98.5°F | Ht 60.0 in | Wt 194.4 lb

## 2014-07-09 DIAGNOSIS — M62838 Other muscle spasm: Secondary | ICD-10-CM

## 2014-07-09 DIAGNOSIS — E785 Hyperlipidemia, unspecified: Secondary | ICD-10-CM

## 2014-07-09 DIAGNOSIS — K219 Gastro-esophageal reflux disease without esophagitis: Secondary | ICD-10-CM

## 2014-07-09 NOTE — Assessment & Plan Note (Signed)
Stable on protonix. No symptoms at this time. Will continue current protonix regimen. F/u in one year for this issue.

## 2014-07-09 NOTE — Assessment & Plan Note (Signed)
Tolerating medication. Will check lipid panel today. F/u in 6 months for this issue if stable.

## 2014-07-09 NOTE — Assessment & Plan Note (Signed)
Much improved today. Hopefully will continued to improve. Will have patient continue prn tramadol. She is to discontinue the baclofen. Continue icy hot. F/u prn.

## 2014-07-09 NOTE — Patient Instructions (Signed)
Nice to see you. I'm glad your neck is doing better. Please continue to use the icy hot. Please try to stop using the flexeril. We will check your cholesterol today and send you a letter with the results. I will see you back in 1-2 months.

## 2014-07-09 NOTE — Progress Notes (Signed)
Patient ID: Rebecca Walter, female   DOB: October 23, 1941, 73 y.o.   MRN: [id number]  [name], MD Phone: [phone]  Rebecca Walter is a 73 y.o. female who presents today for f/u.  HYPERLIPIDEMIA Symptoms Chest pain on exertion:  no   Leg claudication:   no Medications: Compliance- taking Right upper quadrant pain- no  Muscle aches- no Last lipid panel 2013  GERD: is on protonix. This has helped tremendously with her symptoms. She notes no symptoms at this time. She denies burning sensation. She notes no dysphagia. She is tolerating her medication. She infrequently takes zantac in addition to protonix.  Neck pain: improved. Notes now the pain is there only depending on her neck position. She takes 1/2 tab of tramadol at night. She has been taking baclofen TID. Has used icy hot as well. Denies weakness, numbness, saddle anesthesia, and Bowel/urine changes.  Patient is a nonsmoker.   ROS: Per HPI   Physical Exam Filed Vitals:   07/09/14 1433  BP: 145/67  Pulse: 80  Temp: 98.5 F (36.9 C)    Gen: Well NAD HEENT: PERRL,  MMM Lungs: CTABL Nl WOB Heart: RRR no MRG Abd: soft, NT, ND MSK: neck non-tender, no midline neck or back tenderness, no tenderness in back, no swelling noted, tenderness of left shoulder, good ROM of bilateral shoulders, negative empty can test Neuro: 5/5 strength in bilateral biceps, triceps, grip, quads, hamstrings, plantar and dorsiflexion, sensation to light touch intact throughout, 2+ brachioradialis and patellar reflexes Exts: Non edematous BL  LE, warm and well perfused.    Assessment/Plan: Please see individual problem list.  # Healthcare maintenance: patient to get tdap at pharmacy, declined zostavax

## 2014-07-10 ENCOUNTER — Encounter: Payer: Self-pay | Admitting: Family Medicine

## 2014-07-10 LAB — LIPID PANEL
CHOLESTEROL: 163 mg/dL (ref 0–200)
HDL: 39 mg/dL — ABNORMAL LOW (ref >39)
LDL Cholesterol: 78 mg/dL (ref 0–99)
TRIGLYCERIDES: 229 mg/dL — AB (ref <150)
Total CHOL/HDL Ratio: 4.2 Ratio
VLDL: 46 mg/dL — ABNORMAL HIGH (ref 0–40)

## 2014-07-11 ENCOUNTER — Other Ambulatory Visit: Payer: Self-pay | Admitting: Family Medicine

## 2014-07-15 ENCOUNTER — Other Ambulatory Visit: Payer: Self-pay | Admitting: Family Medicine

## 2014-07-22 ENCOUNTER — Other Ambulatory Visit: Payer: Self-pay | Admitting: *Deleted

## 2014-07-22 ENCOUNTER — Telehealth: Payer: Self-pay | Admitting: Family Medicine

## 2014-07-22 MED ORDER — GLUCOSE BLOOD VI STRP: ORAL_STRIP | Status: DC

## 2014-07-22 MED ORDER — BACLOFEN 10 MG PO TABS: 10.0000 mg | ORAL_TABLET | Freq: Three times a day (TID) | ORAL | Status: DC | PRN

## 2014-07-22 NOTE — Telephone Encounter (Signed)
Refill of baclofen sent to pharmacy. Please inform the patient.

## 2014-07-22 NOTE — Telephone Encounter (Signed)
Pt informed. Fleeger, Jessica Dawn  

## 2014-07-22 NOTE — Telephone Encounter (Signed)
Pt called and her Baclofen should be call in to [facility] on [address] . jw

## 2014-07-22 NOTE — Telephone Encounter (Signed)
Called and cancelled @ [facility].  Ok'd with MD to change to #90.  Pt informed. Rebecca Walter, Salome Spotted

## 2014-07-22 NOTE — Telephone Encounter (Signed)
Patient recently taken off of baclofen. Patient states she can tell a difference since not taking and would like to know if Dr. Caryl Bis would reconsider putting her back on them. Please call patient back.

## 2014-07-26 ENCOUNTER — Other Ambulatory Visit: Payer: Self-pay | Admitting: Family Medicine

## 2014-08-05 ENCOUNTER — Other Ambulatory Visit: Payer: Self-pay | Admitting: Family Medicine

## 2014-08-20 ENCOUNTER — Other Ambulatory Visit: Payer: Self-pay | Admitting: Family Medicine

## 2014-08-20 NOTE — Telephone Encounter (Signed)
Patient should be advised that she needs to be seen in clinic if she has continued to require the baclofen. Thanks.

## 2014-08-21 MED ORDER — BACLOFEN 10 MG PO TABS: 10.0000 mg | ORAL_TABLET | Freq: Three times a day (TID) | ORAL | Status: DC | PRN

## 2014-08-21 NOTE — Telephone Encounter (Signed)
Pt informed and appt made.  She was unable to make appt until 09/10/14.  She wanted Dr. Caryl Bis to know that she would be out by then and she would be "hurting pretty bad".  Advised I would send message to MD. Clinton Sawyer, Salome Spotted

## 2014-08-21 NOTE — Telephone Encounter (Signed)
Sent in enough baclofen to get her to the follow-up appointment. Thanks.

## 2014-08-21 NOTE — Addendum Note (Signed)
Addended by: Leone Haven on: 08/21/2014 05:51 PM   Modules accepted: Orders

## 2014-09-09 ENCOUNTER — Other Ambulatory Visit: Payer: Self-pay | Admitting: Family Medicine

## 2014-09-10 ENCOUNTER — Encounter: Payer: Self-pay | Admitting: Family Medicine

## 2014-09-10 ENCOUNTER — Ambulatory Visit (INDEPENDENT_AMBULATORY_CARE_PROVIDER_SITE_OTHER): Payer: Medicare Other | Admitting: Family Medicine

## 2014-09-10 VITALS — BP 147/76 | HR 79 | Temp 98.2°F | Ht 60.0 in | Wt 198.0 lb

## 2014-09-10 DIAGNOSIS — IMO0002 Reserved for concepts with insufficient information to code with codable children: Secondary | ICD-10-CM

## 2014-09-10 DIAGNOSIS — F411 Generalized anxiety disorder: Secondary | ICD-10-CM

## 2014-09-10 DIAGNOSIS — E118 Type 2 diabetes mellitus with unspecified complications: Principal | ICD-10-CM

## 2014-09-10 DIAGNOSIS — M62838 Other muscle spasm: Secondary | ICD-10-CM

## 2014-09-10 DIAGNOSIS — I10 Essential (primary) hypertension: Secondary | ICD-10-CM

## 2014-09-10 DIAGNOSIS — E1165 Type 2 diabetes mellitus with hyperglycemia: Secondary | ICD-10-CM

## 2014-09-10 LAB — POCT GLYCOSYLATED HEMOGLOBIN (HGB A1C): Hemoglobin A1C: 7

## 2014-09-10 MED ORDER — LORAZEPAM 1 MG PO TABS: 1.0000 mg | ORAL_TABLET | Freq: Two times a day (BID) | ORAL | Status: DC | PRN

## 2014-09-10 NOTE — Assessment & Plan Note (Signed)
This is stable from last visit which was improved from previously. No abnormalities on exam. Discussed only taking the baclofen and tramadol if she has pain. If she continues to require baclofen TID we will need to have her see PT for treatment. Continue icy hot. F/u prn.

## 2014-09-10 NOTE — Assessment & Plan Note (Signed)
A1c at goal. Will continue current regimen. F/u in 3 months.

## 2014-09-10 NOTE — Assessment & Plan Note (Signed)
Stable. Refills given for lorazepam. F/u in 3 months.

## 2014-09-10 NOTE — Assessment & Plan Note (Signed)
At goal. Have asked that patient check her blood pressure daily as she reports elevated BPs at home. She is to bring this log to clinic at next visit in one month and will change medications based on log and BP at that visit.

## 2014-09-10 NOTE — Progress Notes (Signed)
Patient ID: Rebecca Walter, female   DOB: 01-29-1941, 73 y.o.   MRN: [id number]  [name], MD Phone: [phone]  Rebecca Walter is a 73 y.o. female who presents today for f/u.  DIABETES Disease Monitoring: Blood Sugar ranges-166 this am, usually in 140s Polyuria/phagia/dipsia- no       Medications: Compliance- taking metformin, glipizide, lantus, novolog Hypoglycemic symptoms- no  HYPERTENSION Disease Monitoring Home BP Monitoring infrequently checking, last was in 150's/70's Chest pain- no    Dyspnea- no Medications Compliance-  Taking amlodipine, benazepril, HCTZ.  Edema- no  Anxiety: this is stable on lorazepam. This medication is beneficial. No depression.   Left posterior neck pain: she notes this is stable since her last visit. She notes she takes the baclofen TID even if she does not have pain. Takes tramadol 1/2 tablet daily as well and ibuprofen. Using icy hot. Occasionally her neck is stiff. She denies weakness in extremities. No radiation of pain, saddle anesthesia, or bowel or urine issues. Does not want PT for this.     Patient is a nonsmoker.   ROS: Per HPI   Physical Exam Filed Vitals:   09/10/14 1606  BP: 147/76  Pulse: 79  Temp: 98.2 F (36.8 C)    Gen: Well NAD Psych: not anxious appearing HEENT: PERRL,  MMM Lungs: CTABL Nl WOB Heart: RRR no MRG Neck and back: no midline tenderness, no pain on palpation of soft tissues or musculature Neuro: CN 2-12 intact, 5/5 strength in bilateral biceps, triceps, grip, quads, hamstrings, plantar and dorsiflexion, sensation to light touch intact in bilateral UE and LE, normal gait, 2+ patellar reflexes Exts: Non edematous BL  LE, warm and well perfused.    Assessment/Plan: Please see individual problem list.  # Healthcare maintenance: needs flu shot at next visit  [name], MD [place] PGY-3

## 2014-09-10 NOTE — Patient Instructions (Signed)
Nice to see you. Please decrease the amount of baclofen you have been taking. Please only take this if you have pain.  I would like you to keep a log of your blood pressures at home and bring them to a follow-up appointment in one month.

## 2014-09-15 ENCOUNTER — Other Ambulatory Visit: Payer: Self-pay | Admitting: Family Medicine

## 2014-09-27 ENCOUNTER — Other Ambulatory Visit: Payer: Self-pay | Admitting: Family Medicine

## 2014-09-28 ENCOUNTER — Other Ambulatory Visit: Payer: Self-pay | Admitting: Family Medicine

## 2014-10-04 ENCOUNTER — Other Ambulatory Visit: Payer: Self-pay

## 2014-10-08 ENCOUNTER — Encounter: Payer: Self-pay | Admitting: Family Medicine

## 2014-10-08 ENCOUNTER — Ambulatory Visit (INDEPENDENT_AMBULATORY_CARE_PROVIDER_SITE_OTHER): Payer: Medicare Other | Admitting: Family Medicine

## 2014-10-08 VITALS — BP 150/77 | HR 89 | Temp 98.4°F | Wt 199.0 lb

## 2014-10-08 DIAGNOSIS — Z23 Encounter for immunization: Secondary | ICD-10-CM

## 2014-10-08 DIAGNOSIS — I10 Essential (primary) hypertension: Secondary | ICD-10-CM

## 2014-10-08 LAB — BASIC METABOLIC PANEL
BUN: 27 mg/dL — ABNORMAL HIGH (ref 6–23)
CHLORIDE: 96 meq/L (ref 96–112)
CO2: 28 mEq/L (ref 19–32)
Calcium: 10 mg/dL (ref 8.4–10.5)
Creat: 0.94 mg/dL (ref 0.50–1.10)
Glucose, Bld: 195 mg/dL — ABNORMAL HIGH (ref 70–99)
POTASSIUM: 4.1 meq/L (ref 3.5–5.3)
Sodium: 135 mEq/L (ref 135–145)

## 2014-10-08 MED ORDER — BENAZEPRIL HCL 40 MG PO TABS: ORAL_TABLET | ORAL | Status: DC

## 2014-10-08 NOTE — Progress Notes (Signed)
Patient ID: Rebecca Walter, female   DOB: 08/16/1941, 73 y.o.   MRN: [id number]  [name], MD Phone: [phone]  Rebecca Walter is a 73 y.o. female who presents today for f/u.  HYPERTENSION Disease Monitoring Home BP Monitoring checking, brought log in. High 162/74, low 144/78, mostly in the 951-884 range systolic. Chest pain- no    Dyspnea- no Medications Compliance-  Taking benazepril, HCTZ, and amlodipine . Lightheadedness-  no  Edema- no   Patient is a nonsmoker.   ROS: Per HPI   Physical Exam Filed Vitals:   10/08/14 1350  BP: 150/77  Pulse: 89  Temp: 98.4 F (36.9 C)    Gen: Well NAD Lungs: CTABL Nl WOB Heart: RRR no MRG Exts: Non edematous BL  LE, warm and well perfused.    Assessment/Plan: Please see individual problem list.  # Healthcare maintenance: flu shot given  [name], MD [place] PGY-3

## 2014-10-08 NOTE — Patient Instructions (Signed)
Nice to see you. We are going to increase your benazepril to 40 mg daily. Please continue your amlodipine and HCTZ.  We will call with the results of your labs.

## 2014-10-08 NOTE — Assessment & Plan Note (Addendum)
Not at goal on home blood pressure readings. Will increase benazepril to 40 mg daily. Continue amlodipine and HCTZ. Check BMET today and in one week. F/u in one month.

## 2014-10-09 ENCOUNTER — Other Ambulatory Visit: Payer: Self-pay | Admitting: Family Medicine

## 2014-10-09 ENCOUNTER — Encounter: Payer: Self-pay | Admitting: Family Medicine

## 2014-10-15 ENCOUNTER — Other Ambulatory Visit: Payer: Medicare Other

## 2014-10-17 ENCOUNTER — Other Ambulatory Visit: Payer: Self-pay | Admitting: Family Medicine

## 2014-10-17 MED ORDER — LOVASTATIN 40 MG PO TABS: ORAL_TABLET | ORAL | Status: DC

## 2014-10-17 NOTE — Telephone Encounter (Signed)
Pt called and would like a refill on her Tramadol left up front for pickup . Please call when ready. jw

## 2014-10-18 ENCOUNTER — Other Ambulatory Visit: Payer: Medicare Other

## 2014-10-18 DIAGNOSIS — I10 Essential (primary) hypertension: Secondary | ICD-10-CM

## 2014-10-18 MED ORDER — TRAMADOL HCL 50 MG PO TABS: 25.0000 mg | ORAL_TABLET | Freq: Four times a day (QID) | ORAL | Status: DC | PRN

## 2014-10-18 NOTE — Telephone Encounter (Signed)
Called prescription in to pharmacy 

## 2014-10-18 NOTE — Progress Notes (Signed)
BMP DONE TODAY Rebecca Walter 

## 2014-10-18 NOTE — Telephone Encounter (Signed)
Pt is aware that medication was called into pharmacy.  Akio Hudnall,CMA

## 2014-10-19 ENCOUNTER — Encounter: Payer: Self-pay | Admitting: Family Medicine

## 2014-10-19 LAB — BASIC METABOLIC PANEL
BUN: 18 mg/dL (ref 6–23)
CO2: 28 mEq/L (ref 19–32)
CREATININE: 0.77 mg/dL (ref 0.50–1.10)
Calcium: 9.5 mg/dL (ref 8.4–10.5)
Chloride: 99 mEq/L (ref 96–112)
Glucose, Bld: 157 mg/dL — ABNORMAL HIGH (ref 70–99)
Potassium: 4.3 mEq/L (ref 3.5–5.3)
Sodium: 140 mEq/L (ref 135–145)

## 2014-11-18 ENCOUNTER — Telehealth: Payer: Self-pay | Admitting: Family Medicine

## 2014-11-27 ENCOUNTER — Telehealth: Payer: Self-pay | Admitting: *Deleted

## 2014-11-27 MED ORDER — INSULIN ASPART 100 UNIT/ML FLEXPEN: PEN_INJECTOR | SUBCUTANEOUS | Status: DC

## 2014-11-27 MED ORDER — TRAMADOL HCL 50 MG PO TABS: 25.0000 mg | ORAL_TABLET | Freq: Four times a day (QID) | ORAL | Status: DC | PRN

## 2014-11-27 NOTE — Telephone Encounter (Signed)
Already called it in.

## 2014-11-27 NOTE — Telephone Encounter (Signed)
I do not see where this was denied recently. I sent in refills for her tramadol and novolog. Please inform the patient. Thanks.

## 2014-11-27 NOTE — Telephone Encounter (Signed)
Does the tramadol already get called in or do I need to do that?  Bryauna Byrum,CMA

## 2014-11-27 NOTE — Telephone Encounter (Signed)
Pt was wanting someone to call her and explain why the doctor denied her refill on her Novolog. jw

## 2014-11-28 ENCOUNTER — Other Ambulatory Visit: Payer: Self-pay | Admitting: Family Medicine

## 2014-11-29 ENCOUNTER — Encounter: Payer: Self-pay | Admitting: Family Medicine

## 2014-11-29 ENCOUNTER — Ambulatory Visit (INDEPENDENT_AMBULATORY_CARE_PROVIDER_SITE_OTHER): Payer: Medicare Other | Admitting: Family Medicine

## 2014-11-29 VITALS — BP 123/74 | HR 89 | Temp 98.6°F | Wt 197.0 lb

## 2014-11-29 DIAGNOSIS — E118 Type 2 diabetes mellitus with unspecified complications: Secondary | ICD-10-CM

## 2014-11-29 DIAGNOSIS — I10 Essential (primary) hypertension: Secondary | ICD-10-CM

## 2014-11-29 DIAGNOSIS — F419 Anxiety disorder, unspecified: Secondary | ICD-10-CM

## 2014-11-29 LAB — POCT GLYCOSYLATED HEMOGLOBIN (HGB A1C): HEMOGLOBIN A1C: 6.6

## 2014-11-29 MED ORDER — LORAZEPAM 1 MG PO TABS
1.0000 mg | ORAL_TABLET | Freq: Two times a day (BID) | ORAL | Status: DC | PRN
Start: 2014-11-29 — End: 2015-03-13

## 2014-11-29 MED ORDER — LORAZEPAM 1 MG PO TABS: 1.0000 mg | ORAL_TABLET | Freq: Two times a day (BID) | ORAL | Status: DC | PRN

## 2014-11-29 NOTE — Assessment & Plan Note (Signed)
Stable. Refilled ativan. Has not responded to other medications in the past. Will continue current treatment.

## 2014-11-29 NOTE — Progress Notes (Signed)
Patient ID: Rebecca Walter, female   DOB: 02-25-1941, 73 y.o.   MRN: [id number]  [name], MD Phone: [phone]  Rebecca Walter is a 73 y.o. female who presents today for f/u.   HYPERTENSION Disease Monitoring Home BP Monitoring checking intermittently, 408-144'Y systolic Chest pain- nono    Dyspnea- no Medications Compliance-  Taking benazepril, amlodipine, HCTZ, and demadex.   Edema- no Not exercising at all.   DIABETES Disease Monitoring: Blood Sugar ranges-140's-150's Polyuria/phagia/dipsia- no      Medications: Compliance- taking lantus 60 am, Novolog 20 u TID with meals if >150 cbg, metformin, glipizide Hypoglycemic symptoms- no  Anxiety: notes this is stable. States ativan helps. Notes mostly gets anxious when she strays from her routine. Taking ativan BID most of the time. Some times once daily.    Patient is a nonsmoker.   ROS: Per HPI   Physical Exam Filed Vitals:   11/29/14 1405  BP: 123/74  Pulse: 89  Temp: 98.6 F (37 C)    Gen: Well NAD HEENT: PERRL,  MMM Lungs: CTABL Nl WOB Heart: RRR no MRG Exts: Non edematous BL  LE, warm and well perfused.    Assessment/Plan: Please see individual problem list.  # Healthcare maintenance: discussed tdap, has not gotten this from the pharmacy  [name], MD [place] PGY-3

## 2014-11-29 NOTE — Assessment & Plan Note (Signed)
At goal with A1c 6.6. Will continue current regimen.

## 2014-11-29 NOTE — Patient Instructions (Signed)
Nice to see you. We will fill the paper work out for your novolog and fax this in.  I refilled your ativan.

## 2014-11-29 NOTE — Assessment & Plan Note (Signed)
At goal. Will continue current regimen. Discussed exercise. States she will start walking 20-25 minutes daily.

## 2014-12-02 ENCOUNTER — Ambulatory Visit (INDEPENDENT_AMBULATORY_CARE_PROVIDER_SITE_OTHER): Payer: Medicare Other | Admitting: Family Medicine

## 2014-12-02 ENCOUNTER — Encounter: Payer: Self-pay | Admitting: Family Medicine

## 2014-12-02 ENCOUNTER — Ambulatory Visit: Payer: Medicare Other | Admitting: Family Medicine

## 2014-12-02 VITALS — BP 128/72 | HR 86 | Temp 98.4°F | Resp 20 | Wt 198.0 lb

## 2014-12-02 DIAGNOSIS — R590 Localized enlarged lymph nodes: Secondary | ICD-10-CM

## 2014-12-02 LAB — POCT SEDIMENTATION RATE: POCT SED RATE: 15 mm/hr (ref 0–22)

## 2014-12-02 LAB — POCT MONO (EPSTEIN BARR VIRUS): MONO, POC: NEGATIVE

## 2014-12-02 MED ORDER — AMOXICILLIN-POT CLAVULANATE 875-125 MG PO TABS: 1.0000 | ORAL_TABLET | Freq: Two times a day (BID) | ORAL | Status: DC

## 2014-12-02 MED ORDER — TRAMADOL HCL 50 MG PO TABS: 25.0000 mg | ORAL_TABLET | Freq: Four times a day (QID) | ORAL | Status: DC | PRN

## 2014-12-02 NOTE — Addendum Note (Signed)
Addended by: [place] Pink on: 12/02/2014 03:09 PM   Modules accepted: Orders

## 2014-12-02 NOTE — Patient Instructions (Signed)

## 2014-12-02 NOTE — Progress Notes (Signed)
   Subjective:    Patient ID: Rebecca Walter, female    DOB: 08-Apr-1941, 73 y.o.   MRN: [id number]  HPI  Here with 4d hx of enlarged lymphnode of neck.  Reports it is tender, did not have at last appt when she was seen.  - no malaise, fevers/sweats/chills, weight loss - no cough, nausea/vomiting - no sore throat, although it does hurt somewhat when she chews  Unsure if lymph node is getting bigger.  Has sister with lymphoma.  Review of Systems  Constitutional: Negative for chills and diaphoresis.  Respiratory: Negative for cough and shortness of breath.   Cardiovascular: Negative for leg swelling.  Gastrointestinal: Negative for abdominal pain, diarrhea, constipation and anal bleeding.  Endocrine: Negative for cold intolerance and heat intolerance.  Genitourinary: Negative for dysuria, urgency, decreased urine volume and difficulty urinating.  Skin: Negative for color change and pallor.  Neurological: Negative for headaches.  Hematological: Positive for adenopathy. Does not bruise/bleed easily.       Objective:   Physical Exam  Constitutional: She appears well-developed and well-nourished. No distress.  HENT:  Mouth/Throat: Mucous membranes are moist. Pharynx is normal.  Eyes: Conjunctivae and EOM are normal.  Neck: No adenopathy.  Cardiovascular: Normal rate and S2 normal.   Pulmonary/Chest: Effort normal and breath sounds normal.  Abdominal: She exhibits no distension. There is no tenderness.  Musculoskeletal: Normal range of motion.  Lymphadenopathy:    She has cervical adenopathy (left, 4cm x 2cm lesion, very tender to palpation).  Neurological: She is alert. No cranial nerve deficit. Coordination normal.  Skin: Skin is warm. No rash noted. She is not diaphoretic. No pallor.          Assessment & Plan:  Milaina was seen today for cervical lymphadenopathy.  Diagnoses and associated orders for this visit:  Cervical lymphadenopathy - CBC - Sedimentation  Rate - C-reactive protein - Comprehensive metabolic panel - Mono (Epstein Barr Virus) - HIV antibody (with reflex) - Culture, blood (single) - RPR - amoxicillin-clavulanate (AUGMENTIN) 875-125 MG per tablet; Take 1 tablet by mouth 2 (two) times daily. - traMADol (ULTRAM) 50 MG tablet; Take 0.5-1 tablets (25-50 mg total) by mouth every 6 (six) hours as needed.  RTC 1 week for reeval, if still present will perform imaging, CT vs sono

## 2014-12-03 LAB — CBC
HEMATOCRIT: 39.2 % (ref 36.0–46.0)
Hemoglobin: 13.9 g/dL (ref 12.0–15.0)
MCH: 30.2 pg (ref 26.0–34.0)
MCHC: 35.5 g/dL (ref 30.0–36.0)
MCV: 85.2 fL (ref 78.0–100.0)
MPV: 10.2 fL (ref 9.4–12.4)
Platelets: 244 10*3/uL (ref 150–400)
RBC: 4.6 MIL/uL (ref 3.87–5.11)
RDW: 14.3 % (ref 11.5–15.5)
WBC: 10.7 10*3/uL — ABNORMAL HIGH (ref 4.0–10.5)

## 2014-12-03 LAB — COMPREHENSIVE METABOLIC PANEL
ALT: 28 U/L (ref 0–35)
AST: 19 U/L (ref 0–37)
Albumin: 4.5 g/dL (ref 3.5–5.2)
Alkaline Phosphatase: 51 U/L (ref 39–117)
BUN: 19 mg/dL (ref 6–23)
CALCIUM: 9.6 mg/dL (ref 8.4–10.5)
CHLORIDE: 101 meq/L (ref 96–112)
CO2: 27 meq/L (ref 19–32)
Creat: 0.71 mg/dL (ref 0.50–1.10)
GLUCOSE: 149 mg/dL — AB (ref 70–99)
POTASSIUM: 3.9 meq/L (ref 3.5–5.3)
SODIUM: 143 meq/L (ref 135–145)
TOTAL PROTEIN: 7.1 g/dL (ref 6.0–8.3)
Total Bilirubin: 0.3 mg/dL (ref 0.2–1.2)

## 2014-12-03 LAB — HIV ANTIBODY (ROUTINE TESTING W REFLEX): HIV: NONREACTIVE

## 2014-12-03 LAB — RPR

## 2014-12-03 LAB — C-REACTIVE PROTEIN: CRP: 0.8 mg/dL — AB (ref <0.60)

## 2014-12-05 ENCOUNTER — Encounter: Payer: Self-pay | Admitting: Family Medicine

## 2014-12-05 ENCOUNTER — Ambulatory Visit (INDEPENDENT_AMBULATORY_CARE_PROVIDER_SITE_OTHER): Payer: Medicare Other | Admitting: Family Medicine

## 2014-12-05 VITALS — BP 149/64 | HR 82 | Temp 98.4°F | Ht 60.0 in | Wt 194.6 lb

## 2014-12-05 DIAGNOSIS — K1120 Sialoadenitis, unspecified: Secondary | ICD-10-CM

## 2014-12-05 MED ORDER — CLINDAMYCIN HCL 300 MG PO CAPS: 300.0000 mg | ORAL_CAPSULE | Freq: Three times a day (TID) | ORAL | Status: DC

## 2014-12-05 NOTE — Patient Instructions (Signed)
Thank you for coming in, today!  I think you have an infection in the salivary gland under your tongue. This might also be causing your lymph node. I will refer you to the ENT (ear, nose, and throat) doctor hopefully in the next couple of days. They can help [place] figure out what the cause is and how best to treat it, if there is a stone there.  In the meantime, STOP your Augmentin. Start taking clindamycin 300 mg three times per day, for 10 days. You can take ibuprofen for the pain (you can take 400-600 mg every 8 hours as needed, with food). You can also suck on sour candy like hard lemon candy -- this will make you salivate and help flush out the gland.  Come back to see [place] as you need. If things get worse, especially if you have fever, nausea or vomiting, or bleeding / drainage in your mouth, call or come back sooner rather than later, or go to the emergency room.  Please feel free to call with any questions or concerns at any time, at [phone]. --Dr. Venetia Maxon

## 2014-12-05 NOTE — Progress Notes (Signed)
   Subjective:    Patient ID: Rebecca Walter, female    DOB: 03-29-1941, 73 y.o.   MRN: [id number]  HPI: Pt presents to clinic for SDA for a "bump" under her tongue, present for about 6 days. Of note, pt was seen recently for a lymph node in her neck by Dr. Deniece Ree, and had several labs drawn and was given amoxicillin, which has not helped at all. The bump under her tongue is about the same and is very painful when she chew, to the point that she was unable to finish breakfast this morning. She has no sick contacts. Campho-Phenique helped but only slightly and only temporarily. Antiseptic mouthwash also has been no help. Ibuprofen (200 mg one time) helped "a little." She denies bad taste in her mouth, frank bleeding or drainage in her mouth.  Review of Systems: As above. She has no fevers / chills, N/V, rashes, other lymph nodes, cough, SOB.     Objective:   Physical Exam BP 149/64 mmHg  Pulse 82  Temp(Src) 98.4 F (36.9 C) (Oral)  Ht 5' (1.524 m)  Wt 194 lb 9.6 oz (88.27 kg)  BMI 38.01 kg/m2 Gen: non-toxic-appearing adult female in NAD HEENT: [place]/AT, EOMI, PERRLA, MMM  Sublingual swelling with tenderness but no frank fluctuation or drainage, on right  Sublingual swelling red with mucoid discharge from what appears to be sublingual salivary gland duct opening Neck: supple, full ROM, with submandibular swelling consistent with enlarged / tender salivary gland and/or lymphadenopathy Cardio: RRR, no murmur appreciated Pulm: CTAB, no wheezes Skin: no rashes appreciated     Assessment & Plan:  73yo female with neck swelling and inflamed area in mouth suggestive of sialadenitis +/- reactive lymphadenopathy - recent labs suggest mild infection / inflammation but overall are unremarkable (CRP 0.8, WBC 10.7) - discontinue Augmentin as it has been unhelpful and Rx for clindamycin 300 mg TID for 10 days - referred urgently to ENT (scheduled for Thursday 12/24 AM) - suggested supportive care  otherwise (hard lemon candy, oral analgesics, NSAIDs, soft foods, etc) - red flags reviewed that would prompt immediate return to clinic or present to the ED (worse pain, systemic symptoms, inability to tolerate PO, etc) - F/u with PCP Dr. Caryl Bis, otherwise  Note FYI to Dr. Alfonse Flavors, MD PGY-3, [facility] 12/05/2014, 5:03 PM

## 2014-12-08 LAB — CULTURE, BLOOD (SINGLE): ORGANISM ID, BACTERIA: NO GROWTH

## 2014-12-09 ENCOUNTER — Other Ambulatory Visit: Payer: Self-pay | Admitting: Family Medicine

## 2014-12-09 ENCOUNTER — Ambulatory Visit: Payer: Medicare Other | Admitting: Family Medicine

## 2014-12-09 MED ORDER — INSULIN LISPRO 100 UNIT/ML (KWIKPEN): 20.0000 [IU] | PEN_INJECTOR | Freq: Three times a day (TID) | SUBCUTANEOUS | Status: DC

## 2014-12-09 NOTE — Progress Notes (Signed)
Pt is aware of change.  Maryah Marinaro,CMA

## 2014-12-09 NOTE — Progress Notes (Signed)
Please inform the patient that her novolog has been changed to humalog due to her insurance not approving her novolog. Thanks.

## 2014-12-19 ENCOUNTER — Other Ambulatory Visit: Payer: Self-pay | Admitting: Family Medicine

## 2014-12-24 ENCOUNTER — Other Ambulatory Visit: Payer: Self-pay | Admitting: Otolaryngology

## 2014-12-24 DIAGNOSIS — K1120 Sialoadenitis, unspecified: Secondary | ICD-10-CM

## 2014-12-30 ENCOUNTER — Other Ambulatory Visit: Payer: Self-pay | Admitting: Family Medicine

## 2014-12-31 ENCOUNTER — Other Ambulatory Visit: Payer: Self-pay

## 2015-01-03 ENCOUNTER — Other Ambulatory Visit: Payer: Self-pay

## 2015-01-07 ENCOUNTER — Ambulatory Visit
Admission: RE | Admit: 2015-01-07 | Discharge: 2015-01-07 | Disposition: A | Discharge status: Home / Self Care | Payer: Medicare Other | Source: Ambulatory Visit | Attending: Otolaryngology | Admitting: Otolaryngology

## 2015-01-07 DIAGNOSIS — K1120 Sialoadenitis, unspecified: Secondary | ICD-10-CM | POA: Diagnosis not present

## 2015-01-16 ENCOUNTER — Other Ambulatory Visit: Payer: Self-pay | Admitting: Family Medicine

## 2015-01-20 ENCOUNTER — Telehealth: Payer: Self-pay | Admitting: Family Medicine

## 2015-01-20 NOTE — Telephone Encounter (Signed)
Called patient to inform of humalog dosage. Advised that humalog dose is the same as her prior novolog dose. She is to continue using 20 u TID with meals. Patient also notes she is awaiting approval for a CT scan to evaluate her parotid gland after having a stone. Being followed by ENT for this.

## 2015-01-20 NOTE — Telephone Encounter (Signed)
Has changed from novolog to humalog Is the dosage the same? Please advise

## 2015-01-29 DIAGNOSIS — K1120 Sialoadenitis, unspecified: Secondary | ICD-10-CM | POA: Diagnosis not present

## 2015-02-05 ENCOUNTER — Other Ambulatory Visit: Payer: Self-pay | Admitting: Family Medicine

## 2015-02-05 MED ORDER — DEMADEX 20 MG PO TABS: ORAL_TABLET | ORAL | Status: DC

## 2015-02-05 MED ORDER — TRAMADOL HCL 50 MG PO TABS: 25.0000 mg | ORAL_TABLET | Freq: Four times a day (QID) | ORAL | Status: DC | PRN

## 2015-02-05 NOTE — Telephone Encounter (Signed)
LM for patient that rx is ready for pick up. Jazmin Hartsell,CMA  

## 2015-02-05 NOTE — Telephone Encounter (Signed)
Pt called and said that she needs a refill of her Demadex for 30 tablets for 30 days and a refill for tramadol / sr

## 2015-02-06 ENCOUNTER — Ambulatory Visit (INDEPENDENT_AMBULATORY_CARE_PROVIDER_SITE_OTHER): Payer: Medicare Other | Admitting: Family Medicine

## 2015-02-06 ENCOUNTER — Encounter: Payer: Self-pay | Admitting: Family Medicine

## 2015-02-06 VITALS — BP 162/63 | HR 98 | Temp 98.2°F | Ht 60.0 in | Wt 198.6 lb

## 2015-02-06 DIAGNOSIS — I10 Essential (primary) hypertension: Secondary | ICD-10-CM

## 2015-02-06 DIAGNOSIS — R3989 Other symptoms and signs involving the genitourinary system: Secondary | ICD-10-CM

## 2015-02-06 DIAGNOSIS — R39198 Other difficulties with micturition: Secondary | ICD-10-CM | POA: Insufficient documentation

## 2015-02-06 LAB — POCT URINALYSIS DIPSTICK
BILIRUBIN UA: NEGATIVE
Glucose, UA: NEGATIVE
Ketones, UA: NEGATIVE
Nitrite, UA: NEGATIVE
PH UA: 6
Protein, UA: NEGATIVE
RBC UA: NEGATIVE
Spec Grav, UA: <=1.005
Urobilinogen, UA: 0.2

## 2015-02-06 LAB — POCT UA - MICROSCOPIC ONLY

## 2015-02-06 NOTE — Assessment & Plan Note (Addendum)
Patient reports decreasing frequency in urination. No reports of dysuria, hesitancy. Urinalysis today was unremarkable (trace leukocytes noted). Patient has no prior history of renal disease other than nephrolithiasis. Patient reassured as she has normal physical exam and unremarkable urinalysis. Obtaining BMP to ensure no renal dysfunction.

## 2015-02-06 NOTE — Patient Instructions (Signed)
It was nice to see you today.  Your urinalysis was unremarkable.  I'm checking some lab work today to ensure that your kidneys are functioning well.  We will call you with the results.  Follow-up closely with your PCP.  Take care  Dr. Lacinda Axon

## 2015-02-06 NOTE — Progress Notes (Signed)
   Subjective:    Patient ID: Rebecca Walter, female    DOB: 1941/11/07, 74 y.o.   MRN: [id number]  HPI 74 year old female with a history of diabetes, hypertension, and prior nephrolithiasis presents for same day appointment with complaints of difficulty urinating.  1) Difficulty urinating  Patient reports that she has had a decrease in frequency of urination for the past 1.5 weeks.  Patient states that she just wants to make sure everything is okay as she is concerned about decreasing urination in setting of her diabetes.  She reports that she doesn't have the urge to go very often.  She states that she urinates approximately 2 times a day.  No recent issues with incontinence although this is been a problem for her in the past.  No associated dysuria. No hesitancy.   No recent fevers, chills.  Review of Systems Per HPI    Objective:   Physical Exam  Filed Vitals:   02/06/15 1348  BP: 162/63  Pulse: 98  Temp: 98.2 F (36.8 C)   Exam: General: well appearing, NAD. Cardiovascular: RRR. No murmurs, rubs, or gallops. Respiratory: CTAB. No rales, rhonchi, or wheeze. Abdomen: obese, soft, nontender, nondistended.    Assessment & Plan:  See Problem List

## 2015-02-07 ENCOUNTER — Encounter: Payer: Self-pay | Admitting: Family Medicine

## 2015-02-07 LAB — BASIC METABOLIC PANEL WITH GFR
BUN: 23 mg/dL (ref 6–23)
CO2: 27 mEq/L (ref 19–32)
CREATININE: 0.82 mg/dL (ref 0.50–1.10)
Calcium: 9.9 mg/dL (ref 8.4–10.5)
Chloride: 100 mEq/L (ref 96–112)
GFR, EST AFRICAN AMERICAN: 81 mL/min
GFR, EST NON AFRICAN AMERICAN: 70 mL/min
GLUCOSE: 126 mg/dL — AB (ref 70–99)
Potassium: 4.2 mEq/L (ref 3.5–5.3)
SODIUM: 141 meq/L (ref 135–145)

## 2015-02-13 ENCOUNTER — Other Ambulatory Visit: Payer: Self-pay | Admitting: Family Medicine

## 2015-03-10 ENCOUNTER — Ambulatory Visit: Payer: Medicare Other | Admitting: Family Medicine

## 2015-03-10 ENCOUNTER — Other Ambulatory Visit: Payer: Self-pay | Admitting: Family Medicine

## 2015-03-10 NOTE — Telephone Encounter (Signed)
Needs refill on lorazapam Needs it today Has an appt tomorrow but needs at least 2 for today

## 2015-03-11 ENCOUNTER — Encounter: Payer: Self-pay | Admitting: Family Medicine

## 2015-03-11 ENCOUNTER — Ambulatory Visit (INDEPENDENT_AMBULATORY_CARE_PROVIDER_SITE_OTHER): Payer: Medicare Other | Admitting: Family Medicine

## 2015-03-11 ENCOUNTER — Telehealth: Payer: Self-pay | Admitting: Family Medicine

## 2015-03-11 VITALS — BP 132/62 | HR 88 | Temp 98.3°F | Ht 60.0 in | Wt 195.6 lb

## 2015-03-11 DIAGNOSIS — S39012A Strain of muscle, fascia and tendon of lower back, initial encounter: Secondary | ICD-10-CM

## 2015-03-11 DIAGNOSIS — R7309 Other abnormal glucose: Secondary | ICD-10-CM

## 2015-03-11 DIAGNOSIS — J0110 Acute frontal sinusitis, unspecified: Secondary | ICD-10-CM | POA: Diagnosis not present

## 2015-03-11 DIAGNOSIS — E118 Type 2 diabetes mellitus with unspecified complications: Secondary | ICD-10-CM

## 2015-03-11 DIAGNOSIS — I10 Essential (primary) hypertension: Secondary | ICD-10-CM

## 2015-03-11 DIAGNOSIS — R739 Hyperglycemia, unspecified: Secondary | ICD-10-CM

## 2015-03-11 LAB — GLUCOSE, CAPILLARY: Glucose-Capillary: 184 mg/dL — ABNORMAL HIGH (ref 70–99)

## 2015-03-11 LAB — POCT GLYCOSYLATED HEMOGLOBIN (HGB A1C): Hemoglobin A1C: 7.3

## 2015-03-11 MED ORDER — AMOXICILLIN-POT CLAVULANATE 875-125 MG PO TABS: 1.0000 | ORAL_TABLET | Freq: Two times a day (BID) | ORAL | Status: DC

## 2015-03-11 MED ORDER — LORAZEPAM 1 MG PO TABS: 1.0000 mg | ORAL_TABLET | Freq: Two times a day (BID) | ORAL | Status: DC | PRN

## 2015-03-11 MED ORDER — INSULIN ASPART 100 UNIT/ML FLEXPEN: 20.0000 [IU] | PEN_INJECTOR | Freq: Three times a day (TID) | SUBCUTANEOUS | Status: DC

## 2015-03-11 NOTE — Telephone Encounter (Signed)
Patient cannot come at 8:30 appointment with Dr. Valentina Lucks. Please, follow up with Patient.

## 2015-03-11 NOTE — Patient Instructions (Signed)
Nice to see you. We are going to treat you for a sinus infection given your worsening nasal congestion.  If you develop fever or feel worse, have shortness of breath, please seek medical attention.  Please set up an appointment with Dr Valentina Lucks for ambulatory blood pressure monitoring.

## 2015-03-11 NOTE — Telephone Encounter (Signed)
Appt made for 03/13/15 at 11:15 am.  Will call patient tomorrow since she just left and doesn't have a cellphone. Latima Hamza,CMA

## 2015-03-11 NOTE — Telephone Encounter (Signed)
LM ok per ROI in chart with appointment information.  Amayra Kiedrowski,CMA

## 2015-03-13 ENCOUNTER — Ambulatory Visit (INDEPENDENT_AMBULATORY_CARE_PROVIDER_SITE_OTHER): Payer: Medicare Other | Admitting: Pharmacist

## 2015-03-13 ENCOUNTER — Encounter: Payer: Self-pay | Admitting: Pharmacist

## 2015-03-13 VITALS — BP 124/80 | HR 85 | Ht 60.0 in | Wt 199.0 lb

## 2015-03-13 DIAGNOSIS — I10 Essential (primary) hypertension: Secondary | ICD-10-CM | POA: Diagnosis not present

## 2015-03-13 NOTE — Progress Notes (Signed)
S/O:    Patient arrives alone ambulating with a cane.    She presents to the clinic for ambulatory blood pressure evaluation.  She was seen in clinic on 03/11/15 and started an antibiotic for a sinus infection yesterday (03/12/15). She states that she does not feel good and her antibiotic has not made her feel better yet.   Patient reports that she is taking all of her medications. She notes that upon standing sometimes she feels dizzy and like "her head isn't screwed on right." She states that she has not had any hypoglycemic episodes for "a long time."   In fact her blood sugars have been running higher since she was forced to switch from novolog to humalong.   She just recently switched back to Novolog and is hopeful she will regain better control with the use of Novolog with her Lantus.   Discussed procedure for wearing the monitor and rescheduled her for April 7th at which time she will hopefully be feeling back to her normal self. Patient felt that this was the best route to take as well and will return to clinic on April 7th.  A/P: Ambulatory BP monitoring: patient stated that she is not feeling well due to recently diagnosed sinus infection. Just started antibiotics yesterday. Given that the patient is not feeling well, ambulatory BP monitoring was delayed until April 7th. BP monitoring during sickness does not give as clear of a picture about actual BP numbers. Patient voiced understanding and will return to clinic on April 7th to have ambulatory BP monitoring performed at that time.   Reevaluate blood glucose control at next visit.   Results reviewed and written information provided.   F/U Clinic Visit with Dr. Valentina Lucks on April 7th at 10:45am.  Total time in face-to-face counseling 20 minutes.  Patient seen with Richarda Osmond, PharmD Candidate.

## 2015-03-13 NOTE — Assessment & Plan Note (Signed)
Ambulatory BP monitoring: patient stated that she is not feeling well due to recently diagnosed sinus infection. Just started antibiotics yesterday. Given that the patient is not feeling well, ambulatory BP monitoring was delayed until April 7th. BP monitoring during sickness does not give as clear of a picture about actual BP numbers. Patient voiced understanding and will return to clinic on April 7th to have ambulatory BP monitoring performed at that time.

## 2015-03-13 NOTE — Progress Notes (Signed)
Patient ID: Rebecca Walter, female   DOB: 01-31-1941, 74 y.o.   MRN: [id number] Reviewed: Agree with Dr. Graylin Shiver documentation and management.

## 2015-03-13 NOTE — Patient Instructions (Signed)
It was nice to see you today!  We'll see you back here on April 7th at 10:45am. We hope you feel better soon.

## 2015-03-13 NOTE — Assessment & Plan Note (Signed)
Reevaluate blood glucose control at next visit following switch from Humalog back to her Novolog and Lantus combination.

## 2015-03-14 DIAGNOSIS — J329 Chronic sinusitis, unspecified: Secondary | ICD-10-CM | POA: Insufficient documentation

## 2015-03-14 DIAGNOSIS — S39012A Strain of muscle, fascia and tendon of lower back, initial encounter: Secondary | ICD-10-CM | POA: Insufficient documentation

## 2015-03-14 NOTE — Assessment & Plan Note (Signed)
Patient reports elevated BP at home, though on recheck today it is in a normal range for her age. Will have her schedule an appointment to see Dr Valentina Lucks for ambulatory BP monitor to fully categorize what her BP does at home prior to adding an additional medication.

## 2015-03-14 NOTE — Progress Notes (Signed)
Patient ID: Rebecca Walter, female   DOB: January 23, 1941, 74 y.o.   MRN: [id number]  Rebecca Rumps, MD Phone: [phone]  [name] is a 74 y.o. female who presents today for f/u.  HYPERTENSION Disease Monitoring Home BP Monitoring notes last check it was 160/77 Chest pain- no    Dyspnea- no Medications Compliance-  Taking amlodipine and benazepril.  Edema- no  DIABETES Disease Monitoring: Blood Sugar ranges-mostly in the 150-220 range, high of 264 Polyuria/phagia/dipsia- no      Optho: saw last year, is due to see later this year Medications: Compliance- taking lantus, novolog, glipizide, and metformin Hypoglycemic symptoms- no Patient reports that humalog is not working as well as her novolog used to. She has increased her dose of humalog up to 28 u and has not had the same benefit as her novolog. She would like to switch back to novolog.  Sinus congestion noted for the past week and a half. Notes the congestion is in maxillary and frontal sinuses and accompanied by fullness. Is accompanied by nonproductive cough, rhinorrhea, and sore throat. Is blowing clear stuff out of nose. No fever, nausea, or vomiting.   Also notes right mid back pain for the past couple of weeks. Does not remember an injury. No weakness or numbness. No loss of bowel or bladder function, saddle anesthesia, fever, or history of cancer.    PMH: nonsmoker.   ROS: Per HPI   Physical Exam Filed Vitals:   03/11/15 1620  BP: 132/62  Pulse:   Temp:     Gen: NAD HEENT: PERRL,  MMM, normal TMs, no OP erythema, no cervical LAD Lungs: CTABL Nl WOB Heart: RRR  MSK: right lumbar paraspinous muscle TTP, no midline TTP, no masses palpated Neuro: 5/5 strength in bilateral quads, hamstrings, plantar and dorsiflexion, sensation to light touch intact in bilateral LE, normal gait while using cane Exts: Non edematous BL  LE, warm and well perfused.    Assessment/Plan: Please see individual problem list.  Rebecca Rumps, MD [place] PGY-3

## 2015-03-14 NOTE — Assessment & Plan Note (Addendum)
A1c worse than previously. Now at 7.3 from 6.6 3 months ago. Patient requesting switch back to novolog. Will send this in to pharmacy and fill out PA when this comes to our office. Continue glipizide, metformin, and lantus. I did discuss increasing metformin dose, though patient declined this given GI upset in the past. If novolog does not get approved, may need to revisit metformin dosing.

## 2015-03-14 NOTE — Assessment & Plan Note (Signed)
Likely paraspinous muscle strain. No red flags. Has baclofen and tramadol at home. Will continue these medications for discomfort and continue normal activities. Given return precautions.

## 2015-03-14 NOTE — Assessment & Plan Note (Addendum)
Patient with symptoms of sinusitis for 1.5 weeks. Will treat with augmentin. Continue nasonex nasal spray. Discussed supportive measures. Given return precautions.

## 2015-03-20 ENCOUNTER — Encounter: Payer: Self-pay | Admitting: *Deleted

## 2015-03-20 NOTE — Progress Notes (Signed)
Prior Authorization received from [employer] for [employer]. Formulary and PA form placed in provider box for completion. Derl Barrow, RN

## 2015-03-24 NOTE — Progress Notes (Signed)
PA form for Novolog Flex Pen faxed to [employer] for review.  Derl Barrow, RN

## 2015-03-25 ENCOUNTER — Other Ambulatory Visit: Payer: Self-pay | Admitting: *Deleted

## 2015-03-25 MED ORDER — TRAMADOL HCL 50 MG PO TABS: 25.0000 mg | ORAL_TABLET | Freq: Three times a day (TID) | ORAL | Status: DC | PRN

## 2015-03-25 NOTE — Progress Notes (Signed)
Received PA approval for Novolog FlexPen via [employer].  Med approved for 03/24/2015 - 03/23/2016.  PA confirmation number [id number]. Derl Barrow, RN

## 2015-03-25 NOTE — Telephone Encounter (Signed)
Refill called in to pharmacy

## 2015-03-27 ENCOUNTER — Ambulatory Visit (INDEPENDENT_AMBULATORY_CARE_PROVIDER_SITE_OTHER): Payer: Medicare Other | Admitting: Pharmacist

## 2015-03-27 VITALS — BP 142/64 | HR 85 | Ht 59.45 in | Wt 198.6 lb

## 2015-03-27 DIAGNOSIS — M1000 Idiopathic gout, unspecified site: Secondary | ICD-10-CM | POA: Diagnosis not present

## 2015-03-27 DIAGNOSIS — I10 Essential (primary) hypertension: Secondary | ICD-10-CM

## 2015-03-27 NOTE — Patient Instructions (Addendum)
It was great seeing you!  Stop taking benazepril 40mg . Please start taking benazepril 20mg  twice daily- one in the morning and one in evening.  Stop taking hydrochlorothiazide 12.5mg .  Document any symptoms of feeling dizzy after making these changes.  Please return to see Dr. Valentina Lucks in 2 weeks.

## 2015-03-27 NOTE — Progress Notes (Signed)
S:    Patient arrives in good spirits, walking with use of cane.    She presents to the clinic for ambulatory blood pressure evaluation.   Diagnosed with Hypertension recently.   Has monitored her blood pressure at home.   She believes she is completely recovered from sinus congestion symptoms experienced a few weeks ago.  Medication compliance is reported to be excellent.   Discussed procedure for wearing the monitor and gave patient written instructions. Monitor was placed on non-dominant arm with instructions to return in the morning.   Current BP Medications include:  Amlodipine, benazepril, HCTZ, and torsemide   Patient returned to the clinic and reported no problems with blood pressure monitoring device.  She has multiple naps during the last 24 hours AND had some symptoms of "dizziness" during the late afternoon.   It is noteworthy to report that this patient took an extra dose of benazepril 40mg  in the evening because she felt dizzy.   She took two doses of benzepril 40mg  during the test day.  He was worried that the benazepril AM dose had worn off.   O:  Last 3 Office BP readings: BP Readings from Last 3 Encounters:  03/27/15 142/64  03/13/15 124/80  03/11/15 132/62    ABPM Study Data: Arm Placement right arm  Placed on Right ARM due to her request as she sleeps on her left side.    Overall Mean 24hr BP:   145/61 mmHg HR: 77   Daytime Mean BP:  152/65 mmHg HR: 81   Nighttime Mean BP:  128/51 mmHg HR: 68   Dipping Pattern: Yes.    Sys:   15.4%   Dia: 22.3%   [normal dipping ~10-20%]   Non-hypertensive ABPM thresholds: daytime BP <135/85 mmHg, sleeptime BP <120/70 mmHg NICE Hypertension Guidelines ([place]) using ABPM: Stage I: >135/85 mmHg, Stage 2: >150/95 mmHg)   BMET    Component Value Date/Time   NA 141 02/06/2015 1418   K 4.2 02/06/2015 1418   CL 100 02/06/2015 1418   CO2 27 02/06/2015 1418   GLUCOSE 126* 02/06/2015 1418   BUN 23 02/06/2015 1418   CREATININE 0.82  02/06/2015 1418   CREATININE 0.90 08/06/2012 1922   CALCIUM 9.9 02/06/2015 1418   GFRNONAA 70 02/06/2015 1418   GFRAA 81 02/06/2015 1418    A/P: Longstanding history of hypertension found to have isolated systolic hypertension given 24-hour ambulatory blood pressure demonstrates excellent control with an average blood pressure of 145/61 mmHg, and a nocturnal dipping pattern that is normal however is was complicated by an extra dose of benazepril during the test day.  We agreed to split the benazepril dose to 20mg  BID to assist with 24 BP coverage and minimize her fear of <24 BP control.  Other changes to medications include: discontinuation of low dose HCTZ as she is taking torsemide and has been wearing compression stockings during the recent past.   She will monitor symptoms of lower extremity swelling and return for BP follow up and uric acid assessment in 2-3 weeks in pharmacy clinic.  Results reviewed and written information provided.   Total time in face-to-face counseling 35 minutes.  Patient seen with Eunice Blase, PharmD Candidate.

## 2015-03-28 MED ORDER — BENAZEPRIL HCL 20 MG PO TABS: 20.0000 mg | ORAL_TABLET | Freq: Two times a day (BID) | ORAL | Status: DC

## 2015-03-28 NOTE — Assessment & Plan Note (Signed)
History of gout with no flares of control in recent past.   Taken off HCTZ today due to isolated systolic hypertension and need for drug therapy minimization.   Reevaluate BP and uric acid in the near future.   Consider losartan replacement for benazepril at the next visit which may also assist with uric acid lowering.   May consider trial of reducing other gout medications if uric acid is < 6.0 on reevaluation.

## 2015-03-28 NOTE — Assessment & Plan Note (Signed)
Longstanding history of hypertension found to have isolated systolic hypertension given 24-hour ambulatory blood pressure demonstrates excellent control with an average blood pressure of 145/61 mmHg, and a nocturnal dipping pattern that is normal however is was complicated by an extra dose of benazepril during the test day.  We agreed to split the benazepril dose to 20mg  BID to assist with 24 BP coverage and minimize her fear of <24 BP control.  Other changes to medications include: discontinuation of low dose HCTZ as she is taking torsemide and has been wearing compression stockings during the recent past.   She will monitor symptoms of lower extremity swelling and return for BP follow up and uric acid assessment in 2-3 weeks in pharmacy clinic.  Results reviewed and written information provided.   Total time in face-to-face counseling 35 minutes.  Patient seen with Eunice Blase, PharmD Candidate

## 2015-03-30 ENCOUNTER — Other Ambulatory Visit: Payer: Self-pay | Admitting: Family Medicine

## 2015-03-31 ENCOUNTER — Telehealth: Payer: Self-pay | Admitting: Family Medicine

## 2015-03-31 NOTE — Telephone Encounter (Signed)
Will forward to PCP for advice. Anasofia Micallef, CMA. 

## 2015-03-31 NOTE — Telephone Encounter (Signed)
Pt called because the manufacture of Demadex now only makes 10 mg tablets. She is currently on 20 mg a day. She needs a new prescription to be 10 mg twice a day so that she can get her prescription. Also the BP medication at 20 mg is not helping at all and wanted to know if the doctor can adjust the dosage higher to see if that will work. Please call patient. jw

## 2015-04-01 MED ORDER — TORSEMIDE 10 MG PO TABS: 20.0000 mg | ORAL_TABLET | Freq: Every day | ORAL | Status: DC

## 2015-04-01 NOTE — Telephone Encounter (Signed)
I can send in the new demadex prescription. If the change in BP medication is not working as well the patient should follow-up for a change in this medication or addition of a new medication.

## 2015-04-01 NOTE — Telephone Encounter (Signed)
Pt is aware of this.  She will need a new prescription for alprazolam also but patient would like to see you.  Your next appt is on 04-24-15.  Can we add her to a same day slot on continuity clinic day. Jazmin Hartsell,CMA

## 2015-04-02 ENCOUNTER — Encounter: Payer: Self-pay | Admitting: Pharmacist

## 2015-04-02 MED ORDER — LORAZEPAM 1 MG PO TABS: 1.0000 mg | ORAL_TABLET | Freq: Two times a day (BID) | ORAL | Status: DC | PRN

## 2015-04-02 NOTE — Telephone Encounter (Signed)
Spoke with patient and you don't have anything available until 04/24/2015.  I did offer her another provider for her medication refill for her ativan but she would like to see only you for this.  Patient did make an appt for 04-24-15 and would like to know if she can enough medication to last until then. Rebecca Walter,CMA

## 2015-04-02 NOTE — Telephone Encounter (Signed)
Prescription for ativan refilled for one month and placed at the front.

## 2015-04-02 NOTE — Progress Notes (Signed)
Patient ID: Rebecca Walter, female   DOB: 01-30-1941, 74 y.o.   MRN: [id number] Reviewed: Agree with Dr. Graylin Shiver documentation and management.

## 2015-04-02 NOTE — Telephone Encounter (Signed)
LM for patient that rx is ready for pick up. Champagne Paletta,CMA  

## 2015-04-02 NOTE — Telephone Encounter (Signed)
You can add her to a same day slot on a continuity clinic day. Thanks.

## 2015-04-04 ENCOUNTER — Other Ambulatory Visit: Payer: Self-pay | Admitting: Family Medicine

## 2015-04-17 ENCOUNTER — Encounter: Payer: Self-pay | Admitting: Pharmacist

## 2015-04-17 ENCOUNTER — Ambulatory Visit (INDEPENDENT_AMBULATORY_CARE_PROVIDER_SITE_OTHER): Payer: Medicare Other | Admitting: Pharmacist

## 2015-04-17 VITALS — BP 147/65 | HR 82 | Wt 201.3 lb

## 2015-04-17 DIAGNOSIS — E119 Type 2 diabetes mellitus without complications: Secondary | ICD-10-CM

## 2015-04-17 DIAGNOSIS — M1000 Idiopathic gout, unspecified site: Secondary | ICD-10-CM

## 2015-04-17 DIAGNOSIS — I10 Essential (primary) hypertension: Secondary | ICD-10-CM | POA: Diagnosis not present

## 2015-04-17 NOTE — Patient Instructions (Signed)
It was great to see you today.  Your blood pressure was 147/65 today (goal is less than 150/90). Continue to take benazepril 40mg  in morning and 20mg  in evening & amlodipine 10mg  in evening.  To help with your feet swelling continue to wear tight stockings and elevate your feet above your heart.   Try to cook more of your meals and try to be more active. Your goal is to loose some weight by your next visit with [place].  Remember, loosing weight will help with your blood pressure and diabetes!  We will measure your uric acid & A1c in [place] 2016.  Follow up with Dr. Caryl Bis on May 5th.  You can decide when you want to see Dr. Valentina Lucks next (in about a month).

## 2015-04-17 NOTE — Progress Notes (Signed)
S:    Patient arrives in good spirits today ambulating with cane.    She presents to the clinic for hypertension and diabetes reevaluation.  Diagnosed with Hypertension recently.  Reports to be monitoring blood pressure at home.  Denies any symptoms of gout recently.   Patient reports adherence with medications.  Denies orthostatic symptoms.   Current BP Medications include:  Benazepril 40mg  in morning and 20mg  in evening, and torsemide 20mg  daily.  She stopped HCTZ after last visit as recommended AND stopped amlodipine on her own.   Antihypertensives tried in the past include: HCTZ, amlodipine.   Diet: reports to be controlling proportion of foods but continues to have hard time controlling salt intake.  Weight: Her weight remains to be around 200lbs and she reports her ideal weight is 160lbs. Her goal is to be~ 195 by next visit.  Exercise: inactive, sedentary.   Willing to consider increase in exercise.     O:  Last 3 Office BP readings: BP Readings from Last 3 Encounters:  04/17/15 147/65  03/27/15 142/64  03/13/15 124/80   PE:  No edema in legs - Bilaterally  A/P:  Hypertension which currently is controlled at goal of <150/90 on current medications of benazepril 40mg  in morning and 20mg  in evening, and torsemide 20mg  daily.   No changes today based on lack of orthostatic symptoms on benazepril 60mg  daily.   Need to reassess edema at follow up visit.   Consideration of a trial OR of dose reduction of torsemide may be considered in the future.   Patient achievement of weight loss goals may also provide opportunity for decreasing blood pressure medications.   After extended discussion patient was willing to work on weight loss as primary means to improve blood pressure control.    Gout - lonstanding history without recent flare.   Consider recheck of uric acid with next lab draw.  Consider trial off of allopurinol if uric acid < 6 at evaluation.   Diabetes - Fair control with CBGs of  130-200 with most frequent readings of 150-170.   Discussed no changes at this time as she appears willing to make small changes to both diet and exercise.   Short-term weight loss goal of 4-5 pounds was established.   Reevaluation of weight at next visit.   Results reviewed and written information provided.   F/U Clinic Visit with Dr. Caryl Bis.  Total time in face-to-face counseling 35 minutes.  Patient seen with Eunice Blase, PharmD Candidate.

## 2015-04-17 NOTE — Assessment & Plan Note (Signed)
Gout - lonstanding history without recent flare.   Consider recheck of uric acid with next lab draw.  Consider trial off of allopurinol if uric acid < 6 at evaluation.

## 2015-04-17 NOTE — Assessment & Plan Note (Signed)
Diabetes - Fair control with CBGs of 130-200 with most frequent readings of 150-170.   Discussed no changes at this time as she appears willing to make small changes to both diet and exercise.   Short-term weight loss goal of 4-5 pounds was established.   Reevaluation of weight at next visit.   Results reviewed and written information provided.   F/U Clinic Visit with Dr. Caryl Bis.  Total time in face-to-face counseling 35 minutes.  Patient seen with Eunice Blase, PharmD Candidate.

## 2015-04-17 NOTE — Assessment & Plan Note (Signed)
Hypertension which currently is controlled at goal of <150/90 on current medications of benazepril 40mg  in morning and 20mg  in evening, and torsemide 20mg  daily.   No changes today based on lack of orthostatic symptoms on benazepril 60mg  daily.   Need to reassess edema at follow up visit.   Consideration of a trial OR of dose reduction of torsemide may be considered in the future.   Patient achievement of weight loss goals may also provide opportunity for decreasing blood pressure medications.   After extended discussion patient was willing to work on weight loss as primary means to improve blood pressure control.

## 2015-04-17 NOTE — Progress Notes (Signed)
Patient ID: Rebecca Walter, female   DOB: 1941-03-24, 74 y.o.   MRN: [id number] Reviewed: Agree with Dr. Graylin Shiver documentation and management.

## 2015-04-24 ENCOUNTER — Ambulatory Visit (INDEPENDENT_AMBULATORY_CARE_PROVIDER_SITE_OTHER): Payer: Medicare Other | Admitting: Family Medicine

## 2015-04-24 ENCOUNTER — Encounter: Payer: Self-pay | Admitting: Family Medicine

## 2015-04-24 VITALS — BP 138/58 | HR 85 | Temp 98.3°F | Ht 59.0 in | Wt 199.0 lb

## 2015-04-24 DIAGNOSIS — M1A0710 Idiopathic chronic gout, right ankle and foot, without tophus (tophi): Secondary | ICD-10-CM | POA: Diagnosis not present

## 2015-04-24 DIAGNOSIS — R011 Cardiac murmur, unspecified: Secondary | ICD-10-CM

## 2015-04-24 DIAGNOSIS — I10 Essential (primary) hypertension: Secondary | ICD-10-CM | POA: Diagnosis not present

## 2015-04-24 DIAGNOSIS — R010 Benign and innocent cardiac murmurs: Secondary | ICD-10-CM

## 2015-04-24 DIAGNOSIS — F419 Anxiety disorder, unspecified: Secondary | ICD-10-CM

## 2015-04-24 LAB — URIC ACID: Uric Acid, Serum: 7.5 mg/dL — ABNORMAL HIGH (ref 2.4–7.0)

## 2015-04-24 MED ORDER — LORAZEPAM 1 MG PO TABS: 1.0000 mg | ORAL_TABLET | Freq: Two times a day (BID) | ORAL | Status: DC | PRN

## 2015-04-24 NOTE — Patient Instructions (Signed)
Nice to see you. Please check your blood pressure daily and keep a log.  We will get an echo to evaluate your heart murmur. I will call with the results. Please stop the colchicine.  We will get a uric acid level. If you develop light headedness, chest pain, or shortness of breath please seek medical attention.

## 2015-04-25 ENCOUNTER — Encounter: Payer: Self-pay | Admitting: *Deleted

## 2015-04-25 ENCOUNTER — Telehealth: Payer: Self-pay | Admitting: Family Medicine

## 2015-04-25 MED ORDER — TORSEMIDE 10 MG PO TABS: 20.0000 mg | ORAL_TABLET | Freq: Every day | ORAL | Status: DC

## 2015-04-25 NOTE — Telephone Encounter (Signed)
Pt called to stating she only have 2 pills left take regarding Demadex left.  She is calling requesting PA be completed today or hopefully on Monday.  Pt stated torsemide does not help her urinate as good as the Demadex.  Will forward to PCP.  Derl Barrow, RN

## 2015-04-25 NOTE — Telephone Encounter (Signed)
Prior Authorization for Demadex placed in provider box for review.  Derl Barrow, RN

## 2015-04-25 NOTE — Telephone Encounter (Signed)
Form completed and returned to [employer].

## 2015-04-25 NOTE — Telephone Encounter (Signed)
Pt called because her prescription for Demadex was refuses by her insurance unless the doctor calls [phone]. She would like [place] to call them. She also wanted some Demadex called in to her local pharmacy because the mail order will take about 10 days before she can get it. Rebecca Walter

## 2015-04-25 NOTE — Telephone Encounter (Signed)
PA form faxed to [facility] for review. Martin, Tamika L, RN  

## 2015-04-25 NOTE — Telephone Encounter (Signed)
Will forward to MD and also to RN team to see if they have received a PA for this medication. Weslyn Holsonback,CMA

## 2015-04-25 NOTE — Progress Notes (Signed)
Authorization received for 2D Echo #[id number].  LM on patient's VM ok per DPR with appt time and date.  She is already aware of where to go since she has had one previously. Lakiya Cottam,CMA

## 2015-04-27 NOTE — Assessment & Plan Note (Signed)
Stable at this time. Refill lorazepam.

## 2015-04-27 NOTE — Assessment & Plan Note (Addendum)
Noted on exam today. Patient states she has not previously been told of this murmur. No symptoms at this time. Trivial regurgitation seen over several valves on echo in 2009. Discussed options of echo vs monitoring and patient opted for echo for further evaluation. Will obtain echo to look for cause of murmur.

## 2015-04-27 NOTE — Assessment & Plan Note (Signed)
No recent attacks. Will check uric acid. Stop colchicine given no recent attacks. Based on uric acid level will consider titrating up allopurinol.

## 2015-04-27 NOTE — Progress Notes (Signed)
Patient ID: Falicia C Squitieri, female   DOB: 08-Feb-1941, 74 y.o.   MRN: [id number]  [name], MD Phone: [phone]  Jenan C Shafran is a 74 y.o. female who presents today for f/u.  HYPERTENSION Disease Monitoring Home BP Monitoring not checking Chest pain- no    Dyspnea- no Medications Compliance-  Taking benazepril 40 mg am and 20 mg pm, demedex, and HCTZ. Lightheadedness-  no  Edema- no No orthopnea or exertional light headedness.  Brother with history of heart murmur.  Gout: no attacks in >5 years. Has been taking colchicine daily and allopurinol. Attacks in the past have been in her right big toe. No pain at this time.   Anxiety: stable. Has been taking lorazepam twice daily with good benefit. Anxiety will typically get worse if she has lots of stuff going on and gets overwhelmed. No SI or HI. No depression.    PMH: nonsmoker.   ROS: Per HPI   Physical Exam Filed Vitals:   04/24/15 1443  BP: 138/58  Pulse:   Temp:     Gen: Well NAD HEENT: PERRL,  MMM Lungs: CTABL Nl WOB Heart: RRR, systolic 2/6 murmur LUSB Exts: Non edematous BL  LE, warm and well perfused.    Assessment/Plan: Please see individual problem list.  [name], MD [place] PGY-3

## 2015-04-27 NOTE — Assessment & Plan Note (Signed)
At goal. DBP is less than 60. Discussed that this should be >60. Recently has been >60, so will have patient start checking BP at home and if is less than 60 at home will need to decrease regimen to prevent low DBP. Continue current regimen for now.

## 2015-04-28 NOTE — Telephone Encounter (Signed)
Per [facility], pt could pick up medication from pharmacy.  It is covered drug.  Pt was informed and stated it would cost her $100 and she can not afford it. Tier cost form faxed to see if pt could get a lower tier cost.  Form is pending.  Derl Barrow, RN

## 2015-04-29 ENCOUNTER — Other Ambulatory Visit: Payer: Self-pay | Admitting: Family Medicine

## 2015-04-29 NOTE — Telephone Encounter (Signed)
Called patient to ask question regarding prior medications for tier change form. From what I recall the patient has tried lasix previously, though did not respond to this. Unfortunately I can not locate in epic where she has been on this before. I wanted to confirm with her that she has tried lasix previously prior to filling out the form. Once she calls back with this information I will complete the form.

## 2015-04-29 NOTE — Telephone Encounter (Signed)
Received fax from [facility] regarding Tier change.  Additional information is needed.  Form placed in provider box for review.  Derl Barrow, RN

## 2015-04-30 ENCOUNTER — Ambulatory Visit (HOSPITAL_COMMUNITY)
Admission: RE | Admit: 2015-04-30 | Discharge: 2015-04-30 | Disposition: A | Discharge status: Home / Self Care | Payer: Medicare Other | Source: Ambulatory Visit | Attending: Family Medicine | Admitting: Family Medicine

## 2015-04-30 DIAGNOSIS — R011 Cardiac murmur, unspecified: Secondary | ICD-10-CM | POA: Insufficient documentation

## 2015-04-30 NOTE — Progress Notes (Signed)
  Echocardiogram 2D Echocardiogram has been performed.  Darlina Sicilian M 04/30/2015, 3:34 PM

## 2015-05-01 ENCOUNTER — Other Ambulatory Visit: Payer: Self-pay | Admitting: Family Medicine

## 2015-05-01 ENCOUNTER — Telehealth: Payer: Self-pay | Admitting: Family Medicine

## 2015-05-01 MED ORDER — ALLOPURINOL 100 MG PO TABS
ORAL_TABLET | ORAL | Status: DC
Start: 2015-05-01 — End: 2016-04-26

## 2015-05-01 NOTE — Telephone Encounter (Signed)
Called patient to inform of echo results and uric acid results. Will increase allopurinol to 200 mg daily as we are stopping colchicine. This was sent to the pharmacy. Counseled on possibility of rash with allopurinol and advised to stop medication and call if develops rash. Also note patient had normal creatinine in February.

## 2015-05-01 NOTE — Telephone Encounter (Signed)
PA denied for Demadex per [facility] due to not enough information provided to approve the drug. Derl Barrow, RN

## 2015-05-07 ENCOUNTER — Other Ambulatory Visit: Payer: Self-pay | Admitting: Family Medicine

## 2015-05-08 ENCOUNTER — Telehealth: Payer: Self-pay | Admitting: Family Medicine

## 2015-05-08 NOTE — Telephone Encounter (Signed)
155/74-  ° °157/73 ° °163/78 ° °158/73 ° °153/74 ° °

## 2015-05-12 MED ORDER — TORSEMIDE 10 MG PO TABS: 20.0000 mg | ORAL_TABLET | Freq: Every day | ORAL | Status: DC

## 2015-05-12 NOTE — Telephone Encounter (Signed)
Called patient. It appears that her demadex has been approved at this time and it appears that her blood pressure benefits from being on it given the elevation since coming off the demadex. This was sent back in to her pharmacy. Approval letter is to be scanned in to the chart.

## 2015-05-13 ENCOUNTER — Other Ambulatory Visit: Payer: Self-pay | Admitting: *Deleted

## 2015-05-13 MED ORDER — TORSEMIDE 10 MG PO TABS: 20.0000 mg | ORAL_TABLET | Freq: Every day | ORAL | Status: DC

## 2015-05-13 NOTE — Telephone Encounter (Signed)
Pt is [facility]  [facility] and called and cancelled order to [facility]. Varetta Chavers, Salome Spotted

## 2015-05-14 ENCOUNTER — Other Ambulatory Visit: Payer: Self-pay | Admitting: Family Medicine

## 2015-05-14 NOTE — Telephone Encounter (Signed)
Spoke with Rebecca Walter at pharmacy and they did receive rx.  Patient will have to wait until tomorrow to receive since they have to order it.  Patient is aware of this. Rebecca Walter,CMA

## 2015-05-14 NOTE — Telephone Encounter (Signed)
Pt called because her pharmacy [facility] said they didn't receive the electronic refill request on her Demadex. Can we resend this. jw

## 2015-05-26 ENCOUNTER — Other Ambulatory Visit: Payer: Self-pay | Admitting: *Deleted

## 2015-05-27 ENCOUNTER — Encounter: Payer: Self-pay | Admitting: Family Medicine

## 2015-05-27 ENCOUNTER — Ambulatory Visit (INDEPENDENT_AMBULATORY_CARE_PROVIDER_SITE_OTHER): Payer: Medicare Other | Admitting: Family Medicine

## 2015-05-27 VITALS — BP 143/58 | HR 90 | Temp 98.0°F | Ht 59.0 in | Wt 200.0 lb

## 2015-05-27 DIAGNOSIS — M1000 Idiopathic gout, unspecified site: Secondary | ICD-10-CM | POA: Diagnosis not present

## 2015-05-27 DIAGNOSIS — M542 Cervicalgia: Secondary | ICD-10-CM | POA: Diagnosis not present

## 2015-05-27 DIAGNOSIS — M6248 Contracture of muscle, other site: Secondary | ICD-10-CM

## 2015-05-27 DIAGNOSIS — I10 Essential (primary) hypertension: Secondary | ICD-10-CM

## 2015-05-27 DIAGNOSIS — M62838 Other muscle spasm: Secondary | ICD-10-CM

## 2015-05-27 MED ORDER — BENAZEPRIL HCL 20 MG PO TABS: 20.0000 mg | ORAL_TABLET | Freq: Two times a day (BID) | ORAL | Status: DC

## 2015-05-27 MED ORDER — TORSEMIDE 10 MG PO TABS
20.0000 mg | ORAL_TABLET | Freq: Every day | ORAL | Status: DC
Start: 2015-05-27 — End: 2015-11-05

## 2015-05-27 MED ORDER — TRAMADOL HCL 50 MG PO TABS: 25.0000 mg | ORAL_TABLET | Freq: Three times a day (TID) | ORAL | Status: DC | PRN

## 2015-05-27 NOTE — Progress Notes (Addendum)
Patient ID: Rebecca Walter, female   DOB: 07/15/1941, 74 y.o.   MRN: [id number]  [name], MD Phone: [phone]  Rebecca Walter is a 74 y.o. female who presents today for f/u.  HYPERTENSION Disease Monitoring Home BP Monitoring average is 150/73 Chest pain- no    Dyspnea- no Medications Compliance-  Taking benazepril 40 mg am and 20 mg pm, demedex. Is not taking amlodipine. Edema- no  Gout: notes has done well with stopping colchicine. Notes no rash with allopurinol increase. No gout attacks. No pain in big toe.   Neck pain: this is a chronic issue. Has intermittent left sided neck pain. Notes no radiation down arms. No weakness. Notes positional numbness when sleeping with arms flexed in ulnar distribution that resolves with change in position. No radiation to legs. No fevers or incontinence. No saddle anesthesia.   PMH: nonsmoker.    ROS: Per HPI   Physical Exam Filed Vitals:   05/27/15 1338  BP: 143/58  Pulse: 90  Temp: 98 F (36.7 C)    Gen: Well NAD HEENT: PERRL,  MMM Lungs: CTABL Nl WOB Heart: RRR, 2/6 systolic murmur noted MSK: no midline spine tenderness, there is tenderness over the left trapezius and mild spasm, full ROM of cervical spine Neuro: 5/5 strength in bilateral biceps, triceps, grip, quads, hamstrings, plantar and dorsiflexion, sensation to light touch intact in bilateral UE and LE, normal gait, 2+ patellar, biceps, and brachioradialis reflexes Exts: Non edematous BL  LE, warm and well perfused.    Assessment/Plan: Please see individual problem list.  [name], MD [place] PGY-3

## 2015-05-27 NOTE — Assessment & Plan Note (Signed)
Patient continues to have intermittent neck muscle spasms. No radicular symptoms. No neurological deficits. Will obtain XR cervical spine to evaluate for cause of neck pain. Will refill tramadol. Continue baclofen. Given return precautions.

## 2015-05-27 NOTE — Addendum Note (Signed)
Addended by: Leone Haven on: 05/27/2015 02:41 PM   Modules accepted: Orders

## 2015-05-27 NOTE — Assessment & Plan Note (Signed)
At goal for age. Will continue current medication regimen.

## 2015-05-27 NOTE — Assessment & Plan Note (Signed)
Has tolerated d/c colchicine. Will continue allopurinol. Monitor for recurrence of symptoms.

## 2015-05-27 NOTE — Patient Instructions (Signed)
Nice to see you. Please go get the x-ray of your neck. I will send in your blood pressure medications.  If you develop numbness, tingling, or weakness, loss of bowel or bladder function, fevers, or numbness between your legs please seek medical attention.

## 2015-06-02 ENCOUNTER — Other Ambulatory Visit: Payer: Self-pay | Admitting: *Deleted

## 2015-06-02 MED ORDER — GLUCOSE BLOOD VI STRP: ORAL_STRIP | Status: DC

## 2015-06-11 ENCOUNTER — Telehealth: Payer: Self-pay | Admitting: Family Medicine

## 2015-06-11 NOTE — Telephone Encounter (Signed)
Patient has [employer] medicare.  Will forward to MD to write script for patient's test strips and we will fax to pharmacy. Jazmin Hartsell,CMA

## 2015-06-11 NOTE — Telephone Encounter (Signed)
[facility] Missing drs signature on RX for [facility] and to verify directions on how frequently pt should test Please fax  [phone]

## 2015-06-12 ENCOUNTER — Other Ambulatory Visit: Payer: Self-pay | Admitting: Family Medicine

## 2015-06-12 ENCOUNTER — Telehealth: Payer: Self-pay | Admitting: *Deleted

## 2015-06-12 NOTE — Telephone Encounter (Signed)
Received a fax from [facility] needing a refill on Accu-Chek Kit Aviva Plus.  Fax placed in provider box for review.  Derl Barrow, RN

## 2015-06-13 NOTE — Telephone Encounter (Signed)
Placed in fax box

## 2015-06-15 ENCOUNTER — Other Ambulatory Visit: Payer: Self-pay | Admitting: Family Medicine

## 2015-06-16 ENCOUNTER — Other Ambulatory Visit: Payer: Self-pay

## 2015-06-24 DIAGNOSIS — E119 Type 2 diabetes mellitus without complications: Secondary | ICD-10-CM | POA: Diagnosis not present

## 2015-06-24 DIAGNOSIS — D3131 Benign neoplasm of right choroid: Secondary | ICD-10-CM | POA: Diagnosis not present

## 2015-06-24 DIAGNOSIS — H35372 Puckering of macula, left eye: Secondary | ICD-10-CM | POA: Diagnosis not present

## 2015-06-30 ENCOUNTER — Other Ambulatory Visit: Payer: Self-pay | Admitting: *Deleted

## 2015-06-30 MED ORDER — BACLOFEN 10 MG PO TABS
ORAL_TABLET | ORAL | Status: DC
Start: 2015-06-30 — End: 2015-09-10

## 2015-07-29 DIAGNOSIS — H35372 Puckering of macula, left eye: Secondary | ICD-10-CM | POA: Diagnosis not present

## 2015-07-29 DIAGNOSIS — E119 Type 2 diabetes mellitus without complications: Secondary | ICD-10-CM | POA: Diagnosis not present

## 2015-07-29 DIAGNOSIS — H524 Presbyopia: Secondary | ICD-10-CM | POA: Diagnosis not present

## 2015-08-08 ENCOUNTER — Other Ambulatory Visit: Payer: Self-pay | Admitting: *Deleted

## 2015-08-11 MED ORDER — TRAMADOL HCL 50 MG PO TABS: 25.0000 mg | ORAL_TABLET | Freq: Three times a day (TID) | ORAL | Status: DC | PRN

## 2015-08-11 NOTE — Telephone Encounter (Signed)
Refilled. CGM MD 

## 2015-08-11 NOTE — Telephone Encounter (Signed)
Per patient's request script faxed to pharmacy. Maveryk Renstrom,CMA  

## 2015-08-19 ENCOUNTER — Other Ambulatory Visit: Payer: Self-pay | Admitting: *Deleted

## 2015-08-19 MED ORDER — METFORMIN HCL ER 500 MG PO TB24: ORAL_TABLET | ORAL | Status: DC

## 2015-08-20 ENCOUNTER — Telehealth: Payer: Self-pay | Admitting: Family Medicine

## 2015-08-20 ENCOUNTER — Other Ambulatory Visit: Payer: Self-pay | Admitting: *Deleted

## 2015-08-20 DIAGNOSIS — I10 Essential (primary) hypertension: Secondary | ICD-10-CM

## 2015-08-20 MED ORDER — BENAZEPRIL HCL 20 MG PO TABS
20.0000 mg | ORAL_TABLET | Freq: Two times a day (BID) | ORAL | Status: DC
Start: 2015-08-20 — End: 2016-08-10

## 2015-08-20 MED ORDER — BENAZEPRIL HCL 20 MG PO TABS: 20.0000 mg | ORAL_TABLET | Freq: Every day | ORAL | Status: DC

## 2015-08-20 NOTE — Telephone Encounter (Signed)
Spoke with pharmacy and the insurance will not cover patient to take 60mg  a day.  The max dose they will cover without prior authorization is 40 mg per day.  Will forward to MD.  Rebecca Walter

## 2015-08-20 NOTE — Telephone Encounter (Signed)
Sent in the 20mg  prescription.   CGM MD

## 2015-08-20 NOTE — Telephone Encounter (Signed)
Pt called about her benzopril RX.   She says she needs another RX for 20 mg to be taken once a day for 30 days. She says the 40 rx has not expired but insurance wont pay for it. By the end of the call, I was so confused.  I dont know what she was needing. Insurance will not pay for 180 pills

## 2015-08-21 NOTE — Telephone Encounter (Signed)
Thanks for letting her know this. We are happy to work her in if she is still feeling bad. Thanks for your help.   CGM MD

## 2015-08-21 NOTE — Telephone Encounter (Signed)
Spoke with patient and informed her of script.  She states that she has 40 mg tablets at home and 20 mg tablets also.  Advised her to take her 40mg  tablets in the AM and her 20mg  at night as directed.  Patient is complaining of not feeling well.  States that her BP 180/76 yesterday and her sugar has been in the 200s for the past couple of days.  Patient has declined an appt right now but will call back if she in not feeling better by the end of the day she will back.  Jazmin Hartsell,CMA

## 2015-08-22 ENCOUNTER — Other Ambulatory Visit: Payer: Self-pay | Admitting: Family Medicine

## 2015-08-22 ENCOUNTER — Ambulatory Visit (INDEPENDENT_AMBULATORY_CARE_PROVIDER_SITE_OTHER): Payer: Medicare Other | Admitting: Family Medicine

## 2015-08-22 ENCOUNTER — Encounter: Payer: Self-pay | Admitting: Family Medicine

## 2015-08-22 VITALS — BP 155/63 | HR 93 | Temp 98.5°F | Ht 59.0 in | Wt 203.0 lb

## 2015-08-22 DIAGNOSIS — IMO0001 Reserved for inherently not codable concepts without codable children: Secondary | ICD-10-CM | POA: Insufficient documentation

## 2015-08-22 DIAGNOSIS — R030 Elevated blood-pressure reading, without diagnosis of hypertension: Secondary | ICD-10-CM

## 2015-08-22 DIAGNOSIS — F411 Generalized anxiety disorder: Secondary | ICD-10-CM

## 2015-08-22 NOTE — Progress Notes (Signed)
   Subjective:    Patient ID: Rebecca Walter, female    DOB: Sep 16, 1941, 74 y.o.   MRN: [id number]  Seen for Same day visit for   CC: elevated BP   Took bp Wednesday night and it was 180/76.  No chest pain or shortness of breath  Compliant with medication.  She reports having hypoglycemia of 48 the other day as well.  She has had normal blood sugars since then.  She thinks that they combination of elevated bp and hypoglycemia has been contributing to the way she has been feeling.  Has had diarrhea a couple times.  No fevers or chills.  She reports feeling better and thought about canceling this appt.    Review of Systems   See HPI for ROS. Objective:  BP 155/63 mmHg  Pulse 93  Temp(Src) 98.5 F (36.9 C) (Oral)  Ht 4\' 11"  (1.499 m)  Wt 203 lb (92.08 kg)  BMI 40.98 kg/m2  General: NAD, ambulating with cane  Cardiac: RRR, normal heart sounds, no murmurs.  Respiratory: CTAB, normal effort Extremities: +1 pitting edema b/l  Skin: warm and dry, no rashes noted Neuro: alert and oriented, no focal deficits     Assessment & Plan:   Elevated BP Patient had an elevation of her blood pressure in the 644'I systolic  BP today in her normal range.  Had some malaise but most likely related to hypoglycemia  - no changes today with medications  - given reassurance.  - f/u PRN

## 2015-08-22 NOTE — Patient Instructions (Signed)
Thank you for coming in,   Your blood pressure looks good today.   Please follow up with your primary doctor (Dr. Paula Compton)  for all of your other medical needs.   Please bring all of your medications with you to each visit.    Please feel free to call with any questions or concerns at any time, at [phone]. --Dr. Raeford Razor

## 2015-08-22 NOTE — Assessment & Plan Note (Signed)
Patient had an elevation of her blood pressure in the 939'Q systolic  BP today in her normal range.  Had some malaise but most likely related to hypoglycemia  - no changes today with medications  - given reassurance.  - f/u PRN

## 2015-08-22 NOTE — Telephone Encounter (Signed)
Patient asks medication refill for Lorazepan 1 mg. Please, follow up.

## 2015-08-22 NOTE — Telephone Encounter (Signed)
Spoke with patient and informed her that she would need an office visit to discuss the continuation of this medication and her anxiety.  She voiced understanding and has an appt on 09-10-15 to see you but unable to come in sooner.  She would like to know if she can get enough medication to last til her appt in 2 weeks.  Patient states that she has enough medication to last her til next Tuesday.  Will forward to MD. Johnney Ou

## 2015-08-26 MED ORDER — LORAZEPAM 1 MG PO TABS: 1.0000 mg | ORAL_TABLET | Freq: Two times a day (BID) | ORAL | Status: DC | PRN

## 2015-08-26 NOTE — Telephone Encounter (Signed)
Refilled for 15 more days from today to make her appointment on 9/21. We will discuss treatment options from there. Ideally, she should be coming off of this medication, since it is not indicated as a sole treatment for anxiety, and given her age it is also a fall risk. However, we have not been able to discuss this yet, as I just took over her care. Will see her in 15 days. Please call to let her know Rx is ready. Thanks  CGM MD

## 2015-08-26 NOTE — Telephone Encounter (Signed)
Will fax script for patient to pharmacy.  [phone]. Mathan Darroch,CMA

## 2015-09-01 ENCOUNTER — Other Ambulatory Visit: Payer: Self-pay | Admitting: *Deleted

## 2015-09-02 DIAGNOSIS — H26493 Other secondary cataract, bilateral: Secondary | ICD-10-CM | POA: Diagnosis not present

## 2015-09-02 MED ORDER — INSULIN GLARGINE 100 UNIT/ML SOLOSTAR PEN: PEN_INJECTOR | SUBCUTANEOUS | Status: DC

## 2015-09-05 ENCOUNTER — Other Ambulatory Visit: Payer: Self-pay | Admitting: *Deleted

## 2015-09-05 NOTE — Telephone Encounter (Signed)
She should not be needing this much baclofen. It was originally prescribed prn for muscle spasms. I am hesitant to prescribe this without further evaluation in the office. If she is requesting, have her see me in the office at my next available appointment. Thanks  CGM MD

## 2015-09-08 ENCOUNTER — Other Ambulatory Visit: Payer: Self-pay | Admitting: *Deleted

## 2015-09-08 NOTE — Telephone Encounter (Signed)
Pharmacy request for HCTZ 12.5mg  cap. 1 cap by mouth daily. Will forward to PCP.

## 2015-09-09 NOTE — Telephone Encounter (Signed)
Spoke with patient, she states she had always been taking hctz and didn't realize it had ever been discontinued. Patient would like to continue this medication, she will discuss with Reason Helzer at office visit tomm. Would also like to discus hip pain she's been having at office visit tomm. FYI to PCP.

## 2015-09-09 NOTE — Telephone Encounter (Signed)
Pt. Is not on this medication. Not listed in her med list. If patient is requesting a new med, needs to be seen in the office. Thanks  CGM MD

## 2015-09-09 NOTE — Addendum Note (Signed)
Addended by: Derl Barrow on: 09/09/2015 05:00 PM   Modules accepted: Orders

## 2015-09-10 ENCOUNTER — Encounter: Payer: Self-pay | Admitting: Family Medicine

## 2015-09-10 ENCOUNTER — Ambulatory Visit (INDEPENDENT_AMBULATORY_CARE_PROVIDER_SITE_OTHER): Payer: Medicare Other | Admitting: Family Medicine

## 2015-09-10 ENCOUNTER — Other Ambulatory Visit: Payer: Self-pay | Admitting: Family Medicine

## 2015-09-10 VITALS — BP 130/72 | HR 91 | Temp 98.5°F | Ht 60.0 in | Wt 201.2 lb

## 2015-09-10 DIAGNOSIS — E119 Type 2 diabetes mellitus without complications: Secondary | ICD-10-CM | POA: Diagnosis not present

## 2015-09-10 DIAGNOSIS — F411 Generalized anxiety disorder: Secondary | ICD-10-CM

## 2015-09-10 DIAGNOSIS — F419 Anxiety disorder, unspecified: Secondary | ICD-10-CM | POA: Diagnosis not present

## 2015-09-10 LAB — POCT GLYCOSYLATED HEMOGLOBIN (HGB A1C): HEMOGLOBIN A1C: 7.3

## 2015-09-10 MED ORDER — LORAZEPAM 1 MG PO TABS: 1.0000 mg | ORAL_TABLET | Freq: Two times a day (BID) | ORAL | Status: DC | PRN

## 2015-09-10 MED ORDER — ESCITALOPRAM OXALATE 10 MG PO TABS: 10.0000 mg | ORAL_TABLET | Freq: Every day | ORAL | Status: DC

## 2015-09-10 MED ORDER — BACLOFEN 10 MG PO TABS: ORAL_TABLET | ORAL | Status: DC

## 2015-09-10 MED ORDER — HYDROCHLOROTHIAZIDE 12.5 MG PO CAPS: ORAL_CAPSULE | ORAL | Status: DC

## 2015-09-10 NOTE — Assessment & Plan Note (Signed)
Previously only on Ativan for therapy. This will not be a great long term match for her. Agreed to switch to lexapro today. Will start at 10mg   And adjust as needed. She has a GAD 7 of only 7? Without functional impairment. Question as to how much true anxiety she has vs. Depression with mild anxiety. Ideally, the switch in medication will help. Goal is to wean her off of ativan. Additionally, she will see a therapist for CBT.  - Lexapro 10mg  daily.  - Follow up in 3 weeks.  - Continue ativan for now, with goal to wean off after her anxiety is stable on a baseline medication.  - CBT

## 2015-09-10 NOTE — Telephone Encounter (Signed)
Forgot to clarify with pcp if he was going to refill baclofen for her muscle spasms, is scheduled to come back next month and can discuss further but needs now. [facility] [address]

## 2015-09-10 NOTE — Progress Notes (Signed)
Patient ID: Rebecca Walter, female   DOB: 02-09-1941, 74 y.o.   MRN: [id number]   [facility] Aquilla Hacker, MD Phone: [phone]  Subjective:   # Anxiety - Pt. Here for follow up today.  - Has previously had Anxiety managed solely with Ativan 1mg  BID prn.  - She describes her symptoms as "nerves" / internal "jitteriness" whenever she feels overwhelmed.  - She says that she ativan relaxes her.  - She does not indicate any functional impairment with her anxiety.  - She says she has tried prozac in the past and did not like the way that it made her feel.  - She is very hesitant to adjust her anxiety therapy to include something for baseline control, but she did agree to do this. She is mostly worried about side effects and how the new medication will make her feel.  - She says she has not tried CBT either.  - She does not have panic attacks.  - We discussed the implications of long term use of this medication for her, and the need to bridge to an alternative.  - She reluctantly agreed, but does agree she wants to switch to something that will help.   # DMII  - pt. Here for follow up of her DMII - She has had mostly good blood glucose in the past with A1C near 6.6 at the end of last year. The past two times it has come up to 7.3, and 7.5, and now 7.3 again today.  - She says she thinks her lantus is not working as well.  - We discussed goals for her, given her age, which is around 7.5 or less.  - Avoiding hypoglycemia is a primary goal for her.  - She says she tries to eat healthy, but has been eating "meals on wheels" a lot lately.  - She says she would like to start cooking for herself again, and walking 2-3 times per week.  - I emphasized that performing these types of functional tasks will keep her going longer.  - She wants her A1C to be lower, and we discussed that at this point the best way to do that would be lifestyle change rather than titrating up her  insulin given that we want to avoid hypoglycemia.  All relevant systems were reviewed and were negative unless otherwise noted in the HPI  Past Medical History Reviewed problem list.  Medications- reviewed and updated Current Outpatient Prescriptions  Medication Sig Dispense Refill  . allopurinol (ZYLOPRIM) 100 MG tablet TAKE 2 TABLET (200 MG TOTAL) BY MOUTH DAILY. 60 tablet 11  . aspirin 81 MG chewable tablet Chew 1 tablet (81 mg total) by mouth daily. 30 tablet 5  . B-D ULTRAFINE III SHORT PEN 31G X 8 MM MISC USE AS DIRECTED 100 each 5  . baclofen (LIORESAL) 10 MG tablet TAKE 1 TABLET BY MOUTH 3 TIMES A DAY AS NEEDED FOR MUSCLE SPASMS 60 tablet 2  . benazepril (LOTENSIN) 20 MG tablet Take 1-2 tablets (20-40 mg total) by mouth 2 (two) times daily. Takes 40mg  in AM and 20mg  in evening. 180 tablet 3  . benazepril (LOTENSIN) 20 MG tablet Take 1 tablet (20 mg total) by mouth daily. 90 tablet 3  . escitalopram (LEXAPRO) 10 MG tablet Take 1 tablet (10 mg total) by mouth daily. 30 tablet 0  . glipiZIDE (GLUCOTROL XL) 10 MG 24 hr tablet TAKE 1 TABLET (10 MG TOTAL) BY MOUTH DAILY. 90 tablet  3  . glucose blood (ACCU-CHEK AVIVA PLUS) test strip Check blood sugar first  thing in the morning before eating, after lunch, and  after dinner total 3 times  daily 300 each 3  . Insulin Glargine (LANTUS SOLOSTAR) 100 UNIT/ML Solostar Pen INJECT 60 UNITS INTO THE SKIN DAILY. 45 pen 2  . insulin glargine (LANTUS) 100 UNIT/ML injection Inject 60 Units into the skin daily.     . Lancets (FREESTYLE) lancets Use as directed    . loratadine (CLARITIN) 10 MG tablet Take 1 tablet (10 mg total) by mouth daily. 30 tablet 5  . LORazepam (ATIVAN) 1 MG tablet Take 1 tablet (1 mg total) by mouth 2 (two) times daily as needed for anxiety. Do not fill until 30 days after this date. 60 tablet 0  . LORazepam (ATIVAN) 1 MG tablet Take 1 tablet (1 mg total) by mouth 2 (two) times daily as needed for anxiety. Do not fill until 60 days  after this date. 60 tablet 0  . LORazepam (ATIVAN) 1 MG tablet Take 1 tablet (1 mg total) by mouth 2 (two) times daily as needed for anxiety. 40 tablet 0  . lovastatin (MEVACOR) 40 MG tablet TAKE 2 TABLETS BY MOUTH EVERY DAY (Patient taking differently: 40 mg. TAKE 1 TABLET BY MOUTH EVERY DAY) 60 tablet 5  . metFORMIN (GLUCOPHAGE-XR) 500 MG 24 hr tablet TAKE 1 TABLET BY MOUTH 2 TIMES A DAY BEFORE MEALS 270 tablet 0  . metoCLOPramide (REGLAN) 5 MG tablet Take 1 tablet (5 mg total) by mouth 3 (three) times daily before meals. 90 tablet 5  . Multiple Vitamin (MULTIVITAMIN) tablet Take 1 tablet by mouth daily.      Marland Kitchen NOVOLOG FLEXPEN 100 UNIT/ML FlexPen INJECT 20 UNITS INTO THE SKIN 3 TIMES DAILY WITH MEALS 15 pen 3  . pantoprazole (PROTONIX) 40 MG tablet TAKE 1 TABLET (40 MG TOTAL) BY MOUTH DAILY. (Patient taking differently: TAKE 1 TABLET (40 MG TOTAL) BY MOUTH TWICE DAILY) 30 tablet 5  . Probiotic Product (PROBIOTIC PEARLS) CAPS Take 1 capsule by mouth once.     . torsemide (DEMADEX) 10 MG tablet Take 2 tablets (20 mg total) by mouth daily. Dispense brand name. 60 tablet 2  . traMADol (ULTRAM) 50 MG tablet Take 0.5-1 tablets (25-50 mg total) by mouth every 8 (eight) hours as needed. 30 tablet 1  . vitamin E (NATURAL MIXED TOCOPHEROLS) 400 UNIT capsule Take 400 Units by mouth daily.      No current facility-administered medications for this visit.   Chief complaint-noted No additions to family history Social history- patient is a non smoker  Objective: BP 130/72 mmHg  Pulse 91  Temp(Src) 98.5 F (36.9 C) (Oral)  Ht 5' (1.524 m)  Wt 201 lb 4 oz (91.286 kg)  BMI 39.30 kg/m2 Gen: NAD, alert, cooperative with exam HEENT: NCAT, EOMI, PERRL Neck: FROM, supple CV: RRR, good S1/S2, no murmur Resp: CTABL, no wheezes, non-labored Abd: SNTND, BS present, no guarding or organomegaly Ext: No edema, warm, normal tone, moves UE/LE spontaneously Neuro: Alert and oriented, No gross deficits Skin: no  rashes no lesions  Assessment/Plan:  Anxiety Previously only on Ativan for therapy. This will not be a great long term match for her. Agreed to switch to lexapro today. Will start at 10mg   And adjust as needed. She has a GAD 7 of only 7? Without functional impairment. Question as to how much true anxiety she has vs. Depression with mild anxiety. Ideally, the switch  in medication will help. Goal is to wean her off of ativan. Additionally, she will see a therapist for CBT.  - Lexapro 10mg  daily.  - Follow up in 3 weeks.  - Continue ativan for now, with goal to wean off after her anxiety is stable on a baseline medication.  - CBT   Diabetes mellitus, type II On Lantus 60 units with novolog inbetween. She has been well controlled and at goal. Her goal is 7.5 or less given her age. She would like to see her A1C come lower, which we will try to do with some lifestyle change.  - Walking 2-3 times / week.  - Continue insulin regimen as before.  - Diet changes discussed.  - Follow up in 3 weeks for anxiety and 3 months for DMII.

## 2015-09-10 NOTE — Assessment & Plan Note (Signed)
On Lantus 60 units with novolog inbetween. She has been well controlled and at goal. Her goal is 7.5 or less given her age. She would like to see her A1C come lower, which we will try to do with some lifestyle change.  - Walking 2-3 times / week.  - Continue insulin regimen as before.  - Diet changes discussed.  - Follow up in 3 weeks for anxiety and 3 months for DMII.

## 2015-09-10 NOTE — Telephone Encounter (Signed)
Patient is aware of this. Jazmin Hartsell,CMA  

## 2015-09-10 NOTE — Telephone Encounter (Signed)
3rd request. Martin, Tamika L, RN  

## 2015-09-10 NOTE — Patient Instructions (Signed)
Thanks for coming in today.   We refilled your ativan. The goal is to decrease the need for this over time.   We will start lexapro (escitalopram). You will take 10mg  once daily.   Follow up in 3 weeks, and we'll go up on the dose if we need to.   If you experience any side effects in the meantime, you can give [place] a call, or come back to see [place].   Work on [employer] and exercise with your diabetes.   Thanks for letting us take care of you!  Sincerely,  Paula Compton, MD Family Medicine - PGY 2

## 2015-09-25 ENCOUNTER — Other Ambulatory Visit: Payer: Self-pay | Admitting: Family Medicine

## 2015-09-25 MED ORDER — INSULIN ASPART 100 UNIT/ML FLEXPEN: PEN_INJECTOR | SUBCUTANEOUS | Status: DC

## 2015-09-25 NOTE — Telephone Encounter (Signed)
Pt called and needs a refill on her Novolog called in. jw

## 2015-10-08 ENCOUNTER — Other Ambulatory Visit: Payer: Self-pay | Admitting: *Deleted

## 2015-10-08 DIAGNOSIS — F411 Generalized anxiety disorder: Secondary | ICD-10-CM

## 2015-10-08 MED ORDER — ESCITALOPRAM OXALATE 10 MG PO TABS: 10.0000 mg | ORAL_TABLET | Freq: Every day | ORAL | Status: DC

## 2015-10-10 ENCOUNTER — Encounter: Payer: Self-pay | Admitting: Family Medicine

## 2015-10-10 ENCOUNTER — Ambulatory Visit (INDEPENDENT_AMBULATORY_CARE_PROVIDER_SITE_OTHER): Payer: Medicare Other | Admitting: Family Medicine

## 2015-10-10 VITALS — BP 134/62 | HR 83 | Temp 98.4°F | Ht 60.0 in | Wt 202.1 lb

## 2015-10-10 DIAGNOSIS — Z23 Encounter for immunization: Secondary | ICD-10-CM

## 2015-10-10 DIAGNOSIS — F411 Generalized anxiety disorder: Secondary | ICD-10-CM | POA: Diagnosis not present

## 2015-10-10 MED ORDER — ESCITALOPRAM OXALATE 20 MG PO TABS: 20.0000 mg | ORAL_TABLET | Freq: Every day | ORAL | Status: DC

## 2015-10-10 NOTE — Patient Instructions (Signed)
Thanks for coming in today!   I'm glad that your anxiety control is improving.   The plan will be to decrease the amount of ativan that you are using to 1mg  once at night to help with sleep.   If you need to take the ativan during the day, try to decrease the dose to half of a tablet.   We will increase the dose of your Lexapro to 20mg  daily. This prescription has been sent to your pharmacy.   The Integrated care team has seen you today as well.   Thanks for letting us take care of you!  Sincerely,  Paula Compton, MD Family Medicine - PGY 2

## 2015-10-10 NOTE — Progress Notes (Signed)
Dr. Minda Ditto requested a [place].   Presenting Issue: Rebecca Walter presents with symptoms of generalized anxiety and difficulty coping with stress from everyday tasks.  Report of symptoms:  Patient reports that she frequently feels overwhelmed by simple tasks such as attending appointments. She also reports a lack of motivation to engage in typical activities.  Duration of CURRENT symptoms: Not assessed. However, patient has taken Ativan to treat anxiety since 2006.  Impact on function: Patient reports that she frequently feels anxiety about being able to accomplish tasks (e.g., attend appointments, go to the grocery store), but that she is typically able to accomplish them despite this anxiety. However, she does report a lack of motivation that prevents her from accomplishing some activities.  Psychiatric History - Diagnoses: Anxiety (per chart) - Pharmacotherapy: Patient has been on Ativan since 2006, but is currently being tapered off and transitioned to Lexapro.  Medical conditions that might explain or contribute to symptoms: Patient reports that walking was previously an effective coping strategy for her anxiety, but that she can no longer walk long distances due to knee problems.   Assessment / Plan / Recommendations:Patient stated that she frequently worries she will not have time to complete all of the tasks in the day, although each day typically consists of only a few tasks, and she usually gives herself ample time to complete them. [facility] discussed using evidence to challenge inaccurate cognitions. Furthermore, patient reported that she used to walk to cope with anxiety, but that she can no longer do this due to knee problems. [facility] and patient problem-solved new ways for her to cope with anxiety despite her physical limitations. [facility] suggested water aerobics as an alternative to walking, and patient voiced substantial interest in this activity, but stated that she was unsure  if she would have the motivation to go to classes. Patient declined to schedule a follow-up appointment at this time, but requested [facility]'s contact information and stated she might schedule a follow-up in the future. [facility] wrote down name and informed patient that she should call the front desk and ask for an integrated care appointment should she want to schedule a follow-up in the future.

## 2015-10-10 NOTE — Progress Notes (Signed)
Patient ID: Rebecca Walter, female   DOB: 12-Apr-1941, 74 y.o.   MRN: [id number] Patient ID: Rebecca Walter, female   DOB: Jun 13, 1941, 74 y.o.   MRN: [id number]    Subjective:  HPI # Anxiety - Pt. Here for follow up today regarding anxiety - Patient previously managed anxiety with 1 mg Ativan p.o. BID and states she has been using Ativan pretty much daily since her mother died in 03/22/05. - On 09/12/2015 patient began taking Lexapro 10 mg as prescribed - Patient states anxiety is improved with the addition of Lexapro.  Patient states she is better able to "get through things now."  Patient states her family has commented that she seems less anxious since starting the Lexapro.   - Patient identifies stressors as generally social situations such as grocery shopping and doctor's appointments. - Patient states she is still taking her Ativan BID usually, but occasionally forgets her afternoon dose.  She states she forgot to take her afternoon dose today and "feels okay."    - Patient states the Ativan helps her sleep, but admits to awakening several times in the early morning hours despite use of that agent. - Patient denies SI/HI, depression, dizziness, changes in vision, headache, manic symptoms, N/V/D, abdominal pain, chest pain and falls.    Current Outpatient Prescriptions  Medication Sig Dispense Refill  . allopurinol (ZYLOPRIM) 100 MG tablet TAKE 2 TABLET (200 MG TOTAL) BY MOUTH DAILY. 60 tablet 11  . aspirin 81 MG chewable tablet Chew 1 tablet (81 mg total) by mouth daily. 30 tablet 5  . B-D ULTRAFINE III SHORT PEN 31G X 8 MM MISC USE AS DIRECTED 100 each 5  . baclofen (LIORESAL) 10 MG tablet TAKE 1 TABLET BY MOUTH 3 TIMES A DAY AS NEEDED FOR MUSCLE SPASMS 60 tablet 2  . benazepril (LOTENSIN) 20 MG tablet Take 1-2 tablets (20-40 mg total) by mouth 2 (two) times daily. Takes 40mg  in AM and 20mg  in evening. 180 tablet 3  . benazepril (LOTENSIN) 20 MG tablet Take 1 tablet (20 mg total) by mouth daily.  90 tablet 3  . escitalopram (LEXAPRO) 20 MG tablet Take 1 tablet (20 mg total) by mouth daily. 30 tablet 5  . glipiZIDE (GLUCOTROL XL) 10 MG 24 hr tablet TAKE 1 TABLET (10 MG TOTAL) BY MOUTH DAILY. 90 tablet 3  . hydrochlorothiazide (MICROZIDE) 12.5 MG capsule TAKE 1 CAPSULE (12.5 MG TOTAL) BY MOUTH DAILY. 30 capsule 11  . insulin aspart (NOVOLOG FLEXPEN) 100 UNIT/ML FlexPen INJECT 20 UNITS INTO THE SKIN 3 TIMES DAILY WITH MEALS 15 pen 3  . Insulin Glargine (LANTUS SOLOSTAR) 100 UNIT/ML Solostar Pen INJECT 60 UNITS INTO THE SKIN DAILY. 45 pen 2  . insulin glargine (LANTUS) 100 UNIT/ML injection Inject 60 Units into the skin daily.     Marland Kitchen loratadine (CLARITIN) 10 MG tablet Take 1 tablet (10 mg total) by mouth daily. 30 tablet 5  . LORazepam (ATIVAN) 1 MG tablet Take 1 tablet (1 mg total) by mouth 2 (two) times daily as needed for anxiety. Do not fill until 30 days after this date. 60 tablet 0  . lovastatin (MEVACOR) 40 MG tablet TAKE 2 TABLETS BY MOUTH EVERY DAY (Patient taking differently: 40 mg. TAKE 1 TABLET BY MOUTH EVERY DAY) 60 tablet 5  . metFORMIN (GLUCOPHAGE-XR) 500 MG 24 hr tablet TAKE 1 TABLET BY MOUTH 2 TIMES A DAY BEFORE MEALS 270 tablet 0  . metoCLOPramide (REGLAN) 5 MG tablet Take 1 tablet (5 mg  total) by mouth 3 (three) times daily before meals. 90 tablet 5  . Multiple Vitamin (MULTIVITAMIN) tablet Take 1 tablet by mouth daily.      . pantoprazole (PROTONIX) 40 MG tablet TAKE 1 TABLET (40 MG TOTAL) BY MOUTH DAILY. (Patient taking differently: TAKE 1 TABLET (40 MG TOTAL) BY MOUTH TWICE DAILY) 30 tablet 5  . Probiotic Product (PROBIOTIC PEARLS) CAPS Take 1 capsule by mouth once.     . torsemide (DEMADEX) 10 MG tablet Take 2 tablets (20 mg total) by mouth daily. Dispense brand name. 60 tablet 2  . traMADol (ULTRAM) 50 MG tablet Take 0.5-1 tablets (25-50 mg total) by mouth every 8 (eight) hours as needed. 30 tablet 1  . vitamin E (NATURAL MIXED TOCOPHEROLS) 400 UNIT capsule Take 400 Units  by mouth daily.      No current facility-administered medications for this visit.   Chief complaint-noted No additions to family history Social history- patient is a non smoker  Objective: BP 168/57 mmHg  Pulse 83  Temp(Src) 98.4 F (36.9 C) (Oral)  Ht 5' (1.524 m)  Wt 202 lb 1 oz (91.655 kg)  BMI 39.46 kg/m2  Gen: NAD, alert, cooperative with exam CV: RRR, good S1/S2, no murmur Resp: CTABL, no wheezes, non-labored Neuro: Alert and oriented, No gross deficits Psych: Denies depression, mania, SI/HI, confusion.  Endorses anxiety when she must run errands [employer], doctor's appointment).  Patient's GAD-7 score today is 1.    Assessment/Plan: Patient remains reluctant to reduce Ativan use, saying, "It helps me, if it ain't broke, don't fix it."  Discussed concerns for falls related to Ativan use, particularly with someone who already ambulates with a cane and is taking anti-hypertensive medications.  Patient agreed to continue taking Ativan at night to help with sleep, but will attempt to not take afternoon dose.  Lexapro 10 mg daily appears to be alleviating some of patient's anxiety symptoms.  Will increase dose to 20 mg daily.  Patient agreed to this change.  Discussed talk therapy with patient and integrated care counselor spoke with her in clinic.  Patient to follow-up in clinic in 1 month to assess new Lexapro dosage.  1. Generalized anxiety disorder - escitalopram (LEXAPRO) 20 MG tablet; Take 1 tablet (20 mg total) by mouth daily.  Dispense: 30 tablet; Refill: 5 - Reduce Ativan use to 1 mg by mouth at hour of sleep - Patient and IC counselor discussed strategies for anxiety management.  Patient has information to follow-up with IC as needed.    Sherron Ales, NP [place]    I agree with the above evaluation, assessment, and plan.   Aquilla Hacker, MD Family Medicine Resident - PGY 2  For my own Evaluation, Assessment and Plan, see below.    S:  Pt. Returns for follow up of GAD.  - GAD 7 is 1 today. Down from 7 at her last visit.  - Feels better.  - Feels that lexapro and Ativan are helping.  - She has still been using ativan twice daily. One mg, though she forgot to take it today because she was not as anxious.  - She says that she mostly needs it at night to sleep.  - She otherwise, has not had any side effects from the lexapro or the ativan.  - She has less anxiety in social situations now.  - no palpitations, SOB.   O:  Filed Vitals:   10/10/15 1506  BP: 134/62  Pulse:  Temp:   Repeat BP appropriate.  CV: RRR, No MGR, normal S1/S2, 2+ distal pulses Resp: CTA Bilaterally, appropriate rate, unlabored.  Psych: Appropriate mood /affect, conversant, GAD 7 - 1.  Neuro: No focal deficits.   A/P: 74 y/o F here with GAD that is well controlled currently. Improving on recent start of lexapro. Goal is to wean ativan due to its risk in Geriatric patients. She also would like to talk to Integrative care professional.  - Increase Lexapro to 20mg  daily.  - Decrease Ativan to 1mg  ONCE daily, and 0.5mg  if she HAS to take it.  - She is hesitant to change off of ativan, but willing at this point.  - Integrated care discussion today.  - She already has scripts for ativan.  - Follow up in one month again to see how things are going.

## 2015-10-27 ENCOUNTER — Other Ambulatory Visit: Payer: Self-pay | Admitting: *Deleted

## 2015-10-28 NOTE — Telephone Encounter (Signed)
2nd request.  Saoirse Legere L, RN  

## 2015-10-29 NOTE — Telephone Encounter (Signed)
3rd request. Martin, Tamika L, RN  

## 2015-10-29 NOTE — Telephone Encounter (Signed)
Pt is calling for a refill on her Tramadol. jw °

## 2015-10-30 MED ORDER — TRAMADOL HCL 50 MG PO TABS: 25.0000 mg | ORAL_TABLET | Freq: Three times a day (TID) | ORAL | Status: DC | PRN

## 2015-10-30 NOTE — Addendum Note (Signed)
Addended by: Aquilla Hacker on: 10/30/2015 04:06 PM   Modules accepted: Orders

## 2015-10-30 NOTE — Telephone Encounter (Signed)
Refillled. Placed in the front office for her to pick up. Please let her know to pick this up. Thanks  CGM MD

## 2015-10-30 NOTE — Telephone Encounter (Signed)
Patient is aware that script is ready for pick up.  She requests that [facility] to [facility] [address] [phone]. Meshach Perry,CMA

## 2015-10-31 ENCOUNTER — Other Ambulatory Visit: Payer: Self-pay | Admitting: *Deleted

## 2015-11-04 MED ORDER — LOVASTATIN 40 MG PO TABS: ORAL_TABLET | ORAL | Status: DC

## 2015-11-05 ENCOUNTER — Other Ambulatory Visit: Payer: Self-pay | Admitting: *Deleted

## 2015-11-05 MED ORDER — TORSEMIDE 10 MG PO TABS: 20.0000 mg | ORAL_TABLET | Freq: Every day | ORAL | Status: DC

## 2015-11-12 ENCOUNTER — Ambulatory Visit (INDEPENDENT_AMBULATORY_CARE_PROVIDER_SITE_OTHER): Payer: Medicare Other | Admitting: Family Medicine

## 2015-11-12 ENCOUNTER — Encounter: Payer: Self-pay | Admitting: Family Medicine

## 2015-11-12 VITALS — BP 138/64 | HR 91 | Ht 60.0 in | Wt 203.0 lb

## 2015-11-12 DIAGNOSIS — F419 Anxiety disorder, unspecified: Secondary | ICD-10-CM

## 2015-11-12 MED ORDER — TORSEMIDE 10 MG PO TABS: 20.0000 mg | ORAL_TABLET | Freq: Every day | ORAL | Status: DC

## 2015-11-12 NOTE — Assessment & Plan Note (Signed)
Here for follow up. Doing well on current regimen. Continues to wean back on Ativan, and requiring fewer days of ativan. She mostly only takes it at night. She says that the lexapro is helping tremendously. GAD 7, 4.  - Continue current regimen.  - follow up in 3 months.

## 2015-11-12 NOTE — Progress Notes (Signed)
Patient ID: Rebecca Walter, female   DOB: 09/16/1941, 74 y.o.   MRN: [id number]   [facility] Aquilla Hacker, MD Phone: [phone]  Subjective:   # Anxiety  - GAD 7, 4 with no functional deficit - Pt. Feeling much better today. Feels that her symptoms have been well controlled. In good spirits.  - Tolerating the lexapro well.  - No SI / HI.  - had one "bad day" when her car broke down and needed repairs.   All relevant systems were reviewed and were negative unless otherwise noted in the HPI  Past Medical History Reviewed problem list.  Medications- reviewed and updated Current Outpatient Prescriptions  Medication Sig Dispense Refill  . allopurinol (ZYLOPRIM) 100 MG tablet TAKE 2 TABLET (200 MG TOTAL) BY MOUTH DAILY. 60 tablet 11  . aspirin 81 MG chewable tablet Chew 1 tablet (81 mg total) by mouth daily. 30 tablet 5  . B-D ULTRAFINE III SHORT PEN 31G X 8 MM MISC USE AS DIRECTED 100 each 5  . baclofen (LIORESAL) 10 MG tablet TAKE 1 TABLET BY MOUTH 3 TIMES A DAY AS NEEDED FOR MUSCLE SPASMS 60 tablet 2  . benazepril (LOTENSIN) 20 MG tablet Take 1-2 tablets (20-40 mg total) by mouth 2 (two) times daily. Takes 40mg  in AM and 20mg  in evening. 180 tablet 3  . benazepril (LOTENSIN) 20 MG tablet Take 1 tablet (20 mg total) by mouth daily. 90 tablet 3  . escitalopram (LEXAPRO) 20 MG tablet Take 1 tablet (20 mg total) by mouth daily. 30 tablet 5  . glipiZIDE (GLUCOTROL XL) 10 MG 24 hr tablet TAKE 1 TABLET (10 MG TOTAL) BY MOUTH DAILY. 90 tablet 3  . glucose blood (ACCU-CHEK AVIVA PLUS) test strip Check blood sugar first  thing in the morning before eating, after lunch, and  after dinner total 3 times  daily 300 each 3  . hydrochlorothiazide (MICROZIDE) 12.5 MG capsule TAKE 1 CAPSULE (12.5 MG TOTAL) BY MOUTH DAILY. 30 capsule 11  . insulin aspart (NOVOLOG FLEXPEN) 100 UNIT/ML FlexPen INJECT 20 UNITS INTO THE SKIN 3 TIMES DAILY WITH MEALS 15 pen 3  . Insulin Glargine  (LANTUS SOLOSTAR) 100 UNIT/ML Solostar Pen INJECT 60 UNITS INTO THE SKIN DAILY. 45 pen 2  . insulin glargine (LANTUS) 100 UNIT/ML injection Inject 60 Units into the skin daily.     . Lancets (FREESTYLE) lancets Use as directed    . loratadine (CLARITIN) 10 MG tablet Take 1 tablet (10 mg total) by mouth daily. 30 tablet 5  . LORazepam (ATIVAN) 1 MG tablet Take 1 tablet (1 mg total) by mouth 2 (two) times daily as needed for anxiety. Do not fill until 30 days after this date. 60 tablet 0  . LORazepam (ATIVAN) 1 MG tablet Take 1 tablet (1 mg total) by mouth 2 (two) times daily as needed for anxiety. Do not fill until 60 days after this date. 60 tablet 0  . LORazepam (ATIVAN) 1 MG tablet Take 1 tablet (1 mg total) by mouth 2 (two) times daily as needed for anxiety. 40 tablet 0  . lovastatin (MEVACOR) 40 MG tablet TAKE 2 TABLETS BY MOUTH EVERY DAY 60 tablet 5  . metFORMIN (GLUCOPHAGE-XR) 500 MG 24 hr tablet TAKE 1 TABLET BY MOUTH 2 TIMES A DAY BEFORE MEALS 270 tablet 0  . metoCLOPramide (REGLAN) 5 MG tablet Take 1 tablet (5 mg total) by mouth 3 (three) times daily before meals. 90 tablet 5  . Multiple  Vitamin (MULTIVITAMIN) tablet Take 1 tablet by mouth daily.      . pantoprazole (PROTONIX) 40 MG tablet TAKE 1 TABLET (40 MG TOTAL) BY MOUTH DAILY. (Patient taking differently: TAKE 1 TABLET (40 MG TOTAL) BY MOUTH TWICE DAILY) 30 tablet 5  . Probiotic Product (PROBIOTIC PEARLS) CAPS Take 1 capsule by mouth once.     . torsemide (DEMADEX) 10 MG tablet Take 2 tablets (20 mg total) by mouth daily. Dispense brand name. 180 tablet 2  . traMADol (ULTRAM) 50 MG tablet Take 0.5-1 tablets (25-50 mg total) by mouth every 8 (eight) hours as needed. 30 tablet 3  . vitamin E (NATURAL MIXED TOCOPHEROLS) 400 UNIT capsule Take 400 Units by mouth daily.      No current facility-administered medications for this visit.   Chief complaint-noted No additions to family history Social history- patient is a non  smoker  Objective: BP 175/61 mmHg  Pulse 91  Ht 5' (1.524 m)  Wt 203 lb (92.08 kg)  BMI 39.65 kg/m2 Gen: NAD, alert, cooperative with exam HEENT: NCAT, EOMI, PERRL Neck: FROM, supple CV: RRR, good S1/S2, no murmur Resp: CTABL, no wheezes, non-labored Abd: SNTND, BS present, no guarding or organomegaly Ext: No edema, warm, normal tone, moves UE/LE spontaneously Neuro: Alert and oriented, No gross deficits Skin: no rashes no lesions  Assessment/Plan:  Anxiety Here for follow up. Doing well on current regimen. Continues to wean back on Ativan, and requiring fewer days of ativan. She mostly only takes it at night. She says that the lexapro is helping tremendously. GAD 7, 4.  - Continue current regimen.  - follow up in 3 months.

## 2015-11-12 NOTE — Patient Instructions (Signed)
Thanks for coming in today.   I'm glad that things are going so well for you.   Continue your medication the way it is for now.   We will continue to try to wean you off of the ativan as you are able.   Schedule an appointment in mid February and we will follow up on your anxiety and your Diabetes.   Thanks for letting us take care of you!  Sincerely,  Paula Compton, MD Family Medicine - PGY2

## 2015-11-27 ENCOUNTER — Other Ambulatory Visit: Payer: Self-pay | Admitting: Family Medicine

## 2015-11-27 MED ORDER — BACLOFEN 10 MG PO TABS: ORAL_TABLET | ORAL | Status: DC

## 2015-11-27 NOTE — Telephone Encounter (Signed)
Pt needs a refill on baclofen (LIORESAL) 10 MG tablet, would like for this to be faxed to [facility] on [address]. Thank you, Fonda Kinder, ASA

## 2015-12-23 ENCOUNTER — Other Ambulatory Visit: Payer: Self-pay | Admitting: *Deleted

## 2015-12-23 MED ORDER — INSULIN PEN NEEDLE 31G X 8 MM MISC: Status: DC

## 2015-12-24 ENCOUNTER — Other Ambulatory Visit: Payer: Self-pay | Admitting: *Deleted

## 2015-12-24 MED ORDER — METFORMIN HCL ER 500 MG PO TB24: ORAL_TABLET | ORAL | Status: DC

## 2015-12-25 ENCOUNTER — Telehealth: Payer: Self-pay | Admitting: Family Medicine

## 2015-12-25 NOTE — Telephone Encounter (Signed)
Pt calling and states that she has had bad diarrhea. Would like to know what she can take for it. Thank you, Fonda Kinder, ASA

## 2015-12-25 NOTE — Telephone Encounter (Signed)
Return call to patient regarding diarrhea.  Patient stated symptom started around 4 AM.  She has been several times today.  Patient denies any fever or n/v.  Patient has tried Pepto bismol without relief.  Advised to pt try imodium. Pt has tried half of an imodium with some relief.  Advised patient to keep herself well hydrated and she might have to let it take its course.  If symptoms worsen or she develops a fever and n/v to call clinic for an appointment or go to urgent care.  Patient stated understanding.  Will forward to PCP for further advise.  Derl Barrow, RN

## 2015-12-26 ENCOUNTER — Other Ambulatory Visit: Payer: Self-pay | Admitting: Family Medicine

## 2015-12-26 DIAGNOSIS — F411 Generalized anxiety disorder: Secondary | ICD-10-CM

## 2015-12-26 NOTE — Telephone Encounter (Signed)
Pt needs a refill on Lorazepam faxed or called into [facility]. Pt is asking that this be done today and would like for CMA, Jazmin to contact her to let her know either way. I did inform patient that per policy, the provider has up to 48 business hours to respond. Sadie Reynolds, ASA

## 2015-12-30 NOTE — Telephone Encounter (Signed)
3rd request. Martin, Rebecca L, RN  

## 2015-12-30 NOTE — Telephone Encounter (Signed)
I am out of the office on a rotation and am only there on Wednesdays. I am happy to fill this tomorrow whenever I am at the office. Thanks  CGM MD

## 2015-12-30 NOTE — Telephone Encounter (Signed)
If you could call it in, that would be a big help for me and for her . Thanks!  CGM MD

## 2015-12-30 NOTE — Telephone Encounter (Signed)
If you want to approve it, I can call it in for patient. Jazmin Hartsell,CMA

## 2015-12-30 NOTE — Telephone Encounter (Signed)
Ok just approve it and put phone in instead of print. Jazmin Hartsell,CMA

## 2015-12-30 NOTE — Telephone Encounter (Signed)
Noted, will call her when I am in the office tomorrow. Thanks  CGM MD

## 2015-12-31 MED ORDER — LORAZEPAM 1 MG PO TABS: 1.0000 mg | ORAL_TABLET | Freq: Two times a day (BID) | ORAL | Status: DC | PRN

## 2015-12-31 NOTE — Telephone Encounter (Signed)
Received another refill request for Lorazepam.  Was this medication [facility] yesterday?  Derl Barrow, RN

## 2015-12-31 NOTE — Addendum Note (Signed)
Addended by: Aquilla Hacker on: 12/31/2015 09:31 AM   Modules accepted: Orders

## 2015-12-31 NOTE — Telephone Encounter (Addendum)
Appt 01/21/16 at 1:45 PM.  [facility].  Derl Barrow, RN

## 2015-12-31 NOTE — Telephone Encounter (Signed)
Printed Rx today. Pt. Can come pick up. She needs an appointment, as she is supposed to see me every 3 months for her anxiety and medication management. Have not seen her since 9/21. Filled for one month.   CGM MD

## 2016-01-21 ENCOUNTER — Ambulatory Visit (INDEPENDENT_AMBULATORY_CARE_PROVIDER_SITE_OTHER): Payer: Medicare Other | Admitting: Family Medicine

## 2016-01-21 ENCOUNTER — Encounter: Payer: Self-pay | Admitting: Family Medicine

## 2016-01-21 VITALS — BP 143/68 | HR 83 | Temp 98.4°F | Wt 199.6 lb

## 2016-01-21 DIAGNOSIS — F411 Generalized anxiety disorder: Secondary | ICD-10-CM

## 2016-01-21 DIAGNOSIS — F419 Anxiety disorder, unspecified: Secondary | ICD-10-CM | POA: Diagnosis not present

## 2016-01-21 DIAGNOSIS — E118 Type 2 diabetes mellitus with unspecified complications: Secondary | ICD-10-CM

## 2016-01-21 LAB — POCT GLYCOSYLATED HEMOGLOBIN (HGB A1C): HEMOGLOBIN A1C: 7.2

## 2016-01-21 MED ORDER — LORAZEPAM 1 MG PO TABS
1.0000 mg | ORAL_TABLET | Freq: Two times a day (BID) | ORAL | Status: DC | PRN
Start: 2016-01-21 — End: 2016-03-30

## 2016-01-21 MED ORDER — LORAZEPAM 1 MG PO TABS: 1.0000 mg | ORAL_TABLET | Freq: Two times a day (BID) | ORAL | Status: DC | PRN

## 2016-01-21 MED ORDER — VENLAFAXINE HCL 37.5 MG PO TABS: 37.5000 mg | ORAL_TABLET | Freq: Two times a day (BID) | ORAL | Status: DC

## 2016-01-21 NOTE — Assessment & Plan Note (Signed)
I suspect that there is a disconnect between her symptoms and her "need" for Ativan. She continues to desire this medication, though she clinically has very little anxiety. I am concerned that as she ages this will be a risk factor for her. She agrees to try to taper off of it. We are switching off of Lexapro to Effexor. She is agreeable to this plan. Will titrate effexor with goal of coming off of Ativan.  - Rx Ativan 3 months today. BID 1mg  - Effexor after tapering off of lexapro.  - Return in 2 weeks.  - continue to try to wean ativan.  - Will need to discontinue soon.

## 2016-01-21 NOTE — Progress Notes (Signed)
Patient ID: Rebecca Walter, female   DOB: [birth year], 75 y.o.   MRN: [id number]   [name] Aquilla Hacker, MD Phone: [phone]  Subjective:   # Anxiety - GAD 7 is 7 again, no functional difficulty.  - she continues to say that her anxiety is "terrible".  - She does not feel that lexapro is helping at all. She feels that it is like "taking nothing at all".  - She is compliant with her medication.  - no SI / HI - No depressed mood, or depression symptoms.  - She continues to desire Ativan daily. She is using the medication twice daily.  - she has not had any falls, or incidents.  - She knows not to drive while taking the medication.    DMII - Good control.  - No hypoblycemic episodes.  - Below goal with A1C of 7.2 - Tolerating Lantus, Novolog, Glipizide, and Metformin well.  - No vision changes, or LE pain / numbness.  - Pt. Is very compliant.   All relevant systems were reviewed and were negative unless otherwise noted in the HPI  Past Medical History Reviewed problem list.  Medications- reviewed and updated Current Outpatient Prescriptions  Medication Sig Dispense Refill  . allopurinol (ZYLOPRIM) 100 MG tablet TAKE 2 TABLET (200 MG TOTAL) BY MOUTH DAILY. 60 tablet 11  . aspirin 81 MG chewable tablet Chew 1 tablet (81 mg total) by mouth daily. 30 tablet 5  . baclofen (LIORESAL) 10 MG tablet TAKE 1 TABLET BY MOUTH 3 TIMES A DAY AS NEEDED FOR MUSCLE SPASMS 60 tablet 2  . benazepril (LOTENSIN) 20 MG tablet Take 1-2 tablets (20-40 mg total) by mouth 2 (two) times daily. Takes 40mg  in AM and 20mg  in evening. 180 tablet 3  . benazepril (LOTENSIN) 20 MG tablet Take 1 tablet (20 mg total) by mouth daily. 90 tablet 3  . glipiZIDE (GLUCOTROL XL) 10 MG 24 hr tablet TAKE 1 TABLET (10 MG TOTAL) BY MOUTH DAILY. 90 tablet 3  . glucose blood (ACCU-CHEK AVIVA PLUS) test strip Check blood sugar first  thing in the morning before eating, after lunch, and  after dinner  total 3 times  daily 300 each 3  . hydrochlorothiazide (MICROZIDE) 12.5 MG capsule TAKE 1 CAPSULE (12.5 MG TOTAL) BY MOUTH DAILY. 30 capsule 11  . insulin aspart (NOVOLOG FLEXPEN) 100 UNIT/ML FlexPen INJECT 20 UNITS INTO THE SKIN 3 TIMES DAILY WITH MEALS 15 pen 3  . Insulin Glargine (LANTUS SOLOSTAR) 100 UNIT/ML Solostar Pen INJECT 60 UNITS INTO THE SKIN DAILY. 45 pen 2  . insulin glargine (LANTUS) 100 UNIT/ML injection Inject 60 Units into the skin daily.     . Insulin Pen Needle (B-D ULTRAFINE III SHORT PEN) 31G X 8 MM MISC USE AS DIRECTED 100 each 5  . Lancets (FREESTYLE) lancets Use as directed    . loratadine (CLARITIN) 10 MG tablet Take 1 tablet (10 mg total) by mouth daily. 30 tablet 5  . LORazepam (ATIVAN) 1 MG tablet Take 1 tablet (1 mg total) by mouth 2 (two) times daily as needed for anxiety. Do not fill until 30 days after this date. 60 tablet 0  . LORazepam (ATIVAN) 1 MG tablet Take 1 tablet (1 mg total) by mouth 2 (two) times daily as needed for anxiety. Do not fill until 60 days after this date. 60 tablet 0  . LORazepam (ATIVAN) 1 MG tablet Take 1 tablet (1 mg total) by mouth 2 (two) times  daily as needed for anxiety. 60 tablet 0  . lovastatin (MEVACOR) 40 MG tablet TAKE 2 TABLETS BY MOUTH EVERY DAY 60 tablet 5  . metFORMIN (GLUCOPHAGE-XR) 500 MG 24 hr tablet TAKE 1 TABLET BY MOUTH 2 TIMES A DAY BEFORE MEALS 270 tablet 0  . metoCLOPramide (REGLAN) 5 MG tablet Take 1 tablet (5 mg total) by mouth 3 (three) times daily before meals. 90 tablet 5  . Multiple Vitamin (MULTIVITAMIN) tablet Take 1 tablet by mouth daily.      . pantoprazole (PROTONIX) 40 MG tablet TAKE 1 TABLET (40 MG TOTAL) BY MOUTH DAILY. (Patient taking differently: TAKE 1 TABLET (40 MG TOTAL) BY MOUTH TWICE DAILY) 30 tablet 5  . Probiotic Product (PROBIOTIC PEARLS) CAPS Take 1 capsule by mouth once.     . torsemide (DEMADEX) 10 MG tablet Take 2 tablets (20 mg total) by mouth daily. Dispense brand name. 180 tablet 2  .  traMADol (ULTRAM) 50 MG tablet Take 0.5-1 tablets (25-50 mg total) by mouth every 8 (eight) hours as needed. 30 tablet 3  . venlafaxine (EFFEXOR) 37.5 MG tablet Take 1 tablet (37.5 mg total) by mouth 2 (two) times daily. 30 tablet 0  . vitamin E (NATURAL MIXED TOCOPHEROLS) 400 UNIT capsule Take 400 Units by mouth daily.      No current facility-administered medications for this visit.   Chief complaint-noted No additions to family history Social history- patient is a non smoker  Objective: BP 143/68 mmHg  Pulse 83  Temp(Src) 98.4 F (36.9 C) (Oral)  Wt 199 lb 9.6 oz (90.538 kg) Gen: NAD, alert, cooperative with exam HEENT: NCAT, EOMI, PERRL Neck: FROM, supple CV: RRR, good S1/S2, no murmur Resp: CTABL, no wheezes, non-labored Abd: SNTND, BS present, no guarding or organomegaly Ext: No edema, warm, normal tone, moves UE/LE spontaneously Neuro: Alert and oriented, No gross deficits Skin: no rashes no lesions  Assessment/Plan:   Anxiety I suspect that there is a disconnect between her symptoms and her "need" for Ativan. She continues to desire this medication, though she clinically has very little anxiety. I am concerned that as she ages this will be a risk factor for her. She agrees to try to taper off of it. We are switching off of Lexapro to Effexor. She is agreeable to this plan. Will titrate effexor with goal of coming off of Ativan.  - Rx Ativan 3 months today. BID 1mg  - Effexor after tapering off of lexapro.  - Return in 2 weeks.  - continue to try to wean ativan.  - Will need to discontinue soon.   Diabetes mellitus, type II Continued decent control. A1C is 7.2 today. She says she only had a couple of glucose readings > 200. Tolerating Lantus, Novolog, Metformin, and Glipizide well. No hypoglycemic episodes. No concerns. Plans to see the ophthalmologist in July.  - Continue current regimen.  - Pt. Desires to return in 3 months. Consider Lantus slight increase at that time.   - Goal A1C < 8.  - Diabetic foot exam at next visit.

## 2016-01-21 NOTE — Patient Instructions (Signed)
Thanks for coming in today.   We will switch from the lexapro to the Effexor.   Take 10mg  of Lexapro for one week, then stop.   Once you stop taking the lexapro, then start the Effexor - take one pill daily for one week.   Make an appointment to see me in 2 weeks.   We will see you back in 3 months for Diabetes. For now, continue the regimen that you are on.   Thanks for letting us take care of you.   Sincerely, Paula Compton, MD Family Medicine - PGY 2

## 2016-01-21 NOTE — Assessment & Plan Note (Signed)
Continued decent control. A1C is 7.2 today. She says she only had a couple of glucose readings > 200. Tolerating Lantus, Novolog, Metformin, and Glipizide well. No hypoglycemic episodes. No concerns. Plans to see the ophthalmologist in July.  - Continue current regimen.  - Pt. Desires to return in 3 months. Consider Lantus slight increase at that time.  - Goal A1C < 8.  - Diabetic foot exam at next visit.

## 2016-02-06 ENCOUNTER — Encounter: Payer: Self-pay | Admitting: Family Medicine

## 2016-02-06 ENCOUNTER — Ambulatory Visit (INDEPENDENT_AMBULATORY_CARE_PROVIDER_SITE_OTHER): Payer: Medicare Other | Admitting: Family Medicine

## 2016-02-06 VITALS — BP 130/60 | HR 96 | Temp 98.7°F | Ht 60.0 in | Wt 198.0 lb

## 2016-02-06 DIAGNOSIS — F419 Anxiety disorder, unspecified: Secondary | ICD-10-CM

## 2016-02-06 NOTE — Progress Notes (Signed)
Patient ID: Rebecca Walter, female   DOB: 29-Sep-1941, 75 y.o.   MRN: [id number]   [facility] Aquilla Hacker, MD Phone: [phone]  Subjective:   # GAD - here for follow up after starting venlafaxine and tapering the lexapro.  - GAD is 5. Little functional disability.  - Continues ativan twice a day.  - Ativan is the only thing that helps.  - she has not tolerated the other medicines - Says she sometimes feels depressed, says the ativan helps the depression.  - No SI/HI.  - Appetite is still good.   All relevant systems were reviewed and were negative unless otherwise noted in the HPI  Past Medical History Reviewed problem list.  Medications- reviewed and updated Current Outpatient Prescriptions  Medication Sig Dispense Refill  . allopurinol (ZYLOPRIM) 100 MG tablet TAKE 2 TABLET (200 MG TOTAL) BY MOUTH DAILY. 60 tablet 11  . aspirin 81 MG chewable tablet Chew 1 tablet (81 mg total) by mouth daily. 30 tablet 5  . baclofen (LIORESAL) 10 MG tablet TAKE 1 TABLET BY MOUTH 3 TIMES A DAY AS NEEDED FOR MUSCLE SPASMS 60 tablet 2  . benazepril (LOTENSIN) 20 MG tablet Take 1-2 tablets (20-40 mg total) by mouth 2 (two) times daily. Takes 40mg  in AM and 20mg  in evening. 180 tablet 3  . benazepril (LOTENSIN) 20 MG tablet Take 1 tablet (20 mg total) by mouth daily. 90 tablet 3  . glipiZIDE (GLUCOTROL XL) 10 MG 24 hr tablet TAKE 1 TABLET (10 MG TOTAL) BY MOUTH DAILY. 90 tablet 3  . glucose blood (ACCU-CHEK AVIVA PLUS) test strip Check blood sugar first  thing in the morning before eating, after lunch, and  after dinner total 3 times  daily 300 each 3  . hydrochlorothiazide (MICROZIDE) 12.5 MG capsule TAKE 1 CAPSULE (12.5 MG TOTAL) BY MOUTH DAILY. 30 capsule 11  . insulin aspart (NOVOLOG FLEXPEN) 100 UNIT/ML FlexPen INJECT 20 UNITS INTO THE SKIN 3 TIMES DAILY WITH MEALS 15 pen 3  . Insulin Glargine (LANTUS SOLOSTAR) 100 UNIT/ML Solostar Pen INJECT 60 UNITS INTO THE SKIN  DAILY. 45 pen 2  . insulin glargine (LANTUS) 100 UNIT/ML injection Inject 60 Units into the skin daily.     . Insulin Pen Needle (B-D ULTRAFINE III SHORT PEN) 31G X 8 MM MISC USE AS DIRECTED 100 each 5  . Lancets (FREESTYLE) lancets Use as directed    . loratadine (CLARITIN) 10 MG tablet Take 1 tablet (10 mg total) by mouth daily. 30 tablet 5  . LORazepam (ATIVAN) 1 MG tablet Take 1 tablet (1 mg total) by mouth 2 (two) times daily as needed for anxiety. Do not fill until 30 days after this date. 60 tablet 0  . LORazepam (ATIVAN) 1 MG tablet Take 1 tablet (1 mg total) by mouth 2 (two) times daily as needed for anxiety. Do not fill until 60 days after this date. 60 tablet 0  . LORazepam (ATIVAN) 1 MG tablet Take 1 tablet (1 mg total) by mouth 2 (two) times daily as needed for anxiety. 60 tablet 0  . lovastatin (MEVACOR) 40 MG tablet TAKE 2 TABLETS BY MOUTH EVERY DAY 60 tablet 5  . metFORMIN (GLUCOPHAGE-XR) 500 MG 24 hr tablet TAKE 1 TABLET BY MOUTH 2 TIMES A DAY BEFORE MEALS 270 tablet 0  . metoCLOPramide (REGLAN) 5 MG tablet Take 1 tablet (5 mg total) by mouth 3 (three) times daily before meals. 90 tablet 5  . Multiple Vitamin (  MULTIVITAMIN) tablet Take 1 tablet by mouth daily.      . pantoprazole (PROTONIX) 40 MG tablet TAKE 1 TABLET (40 MG TOTAL) BY MOUTH DAILY. (Patient taking differently: TAKE 1 TABLET (40 MG TOTAL) BY MOUTH TWICE DAILY) 30 tablet 5  . Probiotic Product (PROBIOTIC PEARLS) CAPS Take 1 capsule by mouth once.     . torsemide (DEMADEX) 10 MG tablet Take 2 tablets (20 mg total) by mouth daily. Dispense brand name. 180 tablet 2  . traMADol (ULTRAM) 50 MG tablet Take 0.5-1 tablets (25-50 mg total) by mouth every 8 (eight) hours as needed. 30 tablet 3  . venlafaxine (EFFEXOR) 37.5 MG tablet Take 1 tablet (37.5 mg total) by mouth 2 (two) times daily. 30 tablet 0  . vitamin E (NATURAL MIXED TOCOPHEROLS) 400 UNIT capsule Take 400 Units by mouth daily.      No current facility-administered  medications for this visit.   Chief complaint-noted No additions to family history Social history- patient is a non smoker  Objective: There were no vitals taken for this visit. Gen: NAD, alert, cooperative with exam HEENT: NCAT, EOMI, PERRL Neck: FROM, supple CV: RRR, good S1/S2, no murmur Resp: CTABL, no wheezes, non-labored Abd: SNTND, BS present, no guarding or organomegaly Ext: No edema, warm, normal tone, moves UE/LE spontaneously Neuro: Alert and oriented, No gross deficits Skin: no rashes no lesions Psych: Appropriate mood / Affect, no SI/HI.   Assessment/Plan:  Anxiety She continues to have very mild anxiety. The things she describes getting anxious about she says she worries about "going through life". These are common, every day things. She has a low GAD score, and few symptoms. She says the ativan is the only thing that helps, and she has not tolerated any of the other SSRI's, SNRI's that she has tried. I suspect that this is primarily very low grade anxiety. The ativan is a risk for her at her age, but she acknowledges and accepts this risk and only wants to continue on this treatment. Hopefully we can get her on something else eventually.  - Continue Ativan for now. RX at last visit.  - Stop the Effexor as it is making her nauseated.  - Do not go back on Lexapro as it did not help.  - Follow up with DMII vsisit in 2 months.

## 2016-02-06 NOTE — Patient Instructions (Signed)
Stop taking the Effexor as it is making you nauseated.   Stop the Lexapro.   Continue with the Ativan for now. Will hopefully try something different in the future.   Thanks for letting us take care of you.   Sincerely, Paula Compton, MD Family Medicine - PGY 2

## 2016-02-06 NOTE — Assessment & Plan Note (Signed)
She continues to have very mild anxiety. The things she describes getting anxious about she says she worries about "going through life". These are common, every day things. She has a low GAD score, and few symptoms. She says the ativan is the only thing that helps, and she has not tolerated any of the other SSRI's, SNRI's that she has tried. I suspect that this is primarily very low grade anxiety. The ativan is a risk for her at her age, but she acknowledges and accepts this risk and only wants to continue on this treatment. Hopefully we can get her on something else eventually.  - Continue Ativan for now. RX at last visit.  - Stop the Effexor as it is making her nauseated.  - Do not go back on Lexapro as it did not help.  - Follow up with DMII vsisit in 2 months.

## 2016-02-11 ENCOUNTER — Other Ambulatory Visit: Payer: Self-pay | Admitting: *Deleted

## 2016-02-11 MED ORDER — INSULIN ASPART 100 UNIT/ML FLEXPEN
PEN_INJECTOR | SUBCUTANEOUS | Status: DC
Start: 2016-02-11 — End: 2016-06-15

## 2016-02-17 ENCOUNTER — Other Ambulatory Visit: Payer: Self-pay | Admitting: Family Medicine

## 2016-02-17 NOTE — Telephone Encounter (Signed)
Refill request sent to wrong dr.

## 2016-02-17 NOTE — Addendum Note (Signed)
Addended by: Derl Barrow on: 02/17/2016 02:53 PM   Modules accepted: Orders

## 2016-02-20 ENCOUNTER — Other Ambulatory Visit: Payer: Self-pay | Admitting: Family Medicine

## 2016-02-20 NOTE — Telephone Encounter (Signed)
Pt called because the pharmacy said that the doctor refused her refill on Baclofen. This was denied but by Dr. Ellen Henri office. Can we get a refill on this through her new PCP. Rebecca Walter

## 2016-02-22 MED ORDER — BACLOFEN 10 MG PO TABS: ORAL_TABLET | ORAL | Status: DC

## 2016-02-24 DIAGNOSIS — K224 Dyskinesia of esophagus: Secondary | ICD-10-CM | POA: Diagnosis not present

## 2016-03-25 ENCOUNTER — Other Ambulatory Visit: Payer: Self-pay | Admitting: *Deleted

## 2016-03-25 NOTE — Telephone Encounter (Signed)
Patient calling requesting refill on tramadol. 

## 2016-03-26 MED ORDER — TRAMADOL HCL 50 MG PO TABS: 25.0000 mg | ORAL_TABLET | Freq: Three times a day (TID) | ORAL | Status: DC | PRN

## 2016-03-26 NOTE — Telephone Encounter (Signed)
Please call her and let her know Rx is up front for pick up.   CGM MD

## 2016-03-26 NOTE — Telephone Encounter (Signed)
Pt informed that Rx was ready for pick up.  [facility]  [facility].  Derl Barrow, RN

## 2016-03-30 ENCOUNTER — Encounter: Payer: Self-pay | Admitting: Pharmacist

## 2016-03-30 ENCOUNTER — Ambulatory Visit (INDEPENDENT_AMBULATORY_CARE_PROVIDER_SITE_OTHER): Payer: Medicare Other | Admitting: Pharmacist

## 2016-03-30 VITALS — BP 150/64 | HR 87 | Wt 197.8 lb

## 2016-03-30 DIAGNOSIS — I10 Essential (primary) hypertension: Secondary | ICD-10-CM | POA: Diagnosis not present

## 2016-03-30 DIAGNOSIS — E118 Type 2 diabetes mellitus with unspecified complications: Secondary | ICD-10-CM | POA: Diagnosis not present

## 2016-03-30 DIAGNOSIS — M1000 Idiopathic gout, unspecified site: Secondary | ICD-10-CM

## 2016-03-30 MED ORDER — LOSARTAN POTASSIUM 100 MG PO TABS: 100.0000 mg | ORAL_TABLET | Freq: Every day | ORAL | Status: DC

## 2016-03-30 MED ORDER — EMPAGLIFLOZIN 10 MG PO TABS: 10.0000 mg | ORAL_TABLET | Freq: Every day | ORAL | Status: DC

## 2016-03-30 NOTE — Assessment & Plan Note (Signed)
Gout - longstanding history. Most recent uric acid level is 7.5 from 04/24/15. Pt states she has not had any gout flares recently. Consider recheck of uric acid with next lab draw. Can consider reducing allopurinol dose

## 2016-03-30 NOTE — Patient Instructions (Addendum)
--  If your morning fasting blood sugar is less than 100, then reduce your Lantus from 70 units to 60 units to prevent hypoglycemia.   --We will send a new prescription for losartan to your pharmacy. Losartan will help treat your blood pressure and also help reduce your uric acid level to prevent gout flares.  --Continue to use your benazepril the same way until you are out of tablets. Once you are out, you will start taking losartan 100 mg once daily instead. Do not take both benazepril and losartan.  --Stop taking hydrochlorothiazide (HCTZ). Stop taking glipizide.  --Start taking Jardiance 10 mg once daily. If at any time you have a "stomach bug" and are throwing up or having a lot of diarrhea, hold your Jardiance to prevent dehydration. Resume taking it once you are feeling back to normal and are tolerating food and liquids.  --We would like to see you again in 2 weeks on April 27 at 1:30 pm.

## 2016-03-30 NOTE — Progress Notes (Signed)
S:    Patient arrives in good spirits, but states her blood sugars are "not good". She is ambulating with a cane.  Presents for diabetes evaluation, education, and management at the request of Dr. Minda Ditto. Patient was referred on 02/06/16.  Patient was last seen by Primary Care Provider on 02/06/16.   Patient reports adherence with medications.  Current diabetes medications include: Current hypertension medications include:   Patient denies hypoglycemic events.  Patient reported dietary habits: Eats 3 meals/day Breakfast: 2 scrambled eggs with bacon or sausage and a small serving of grits Lunch: "light lunch", sandwich Dinner: "full dinner", protein, veggies  Patient reported exercise habits: walks sometimes, but knee pain is making this more difficult. Ambulates with a cane  O:  Lab Results  Component Value Date   HGBA1C 7.2 01/21/2016   Filed Vitals:   03/30/16 1413  BP: 150/64  Pulse: 87  PE: No edema on legs bilaterally  Home fasting CBG: 170s  2 hour post-prandial/random CBG: 180s-200s.  A/P:  Diabetes - longstanding currently with fair control. Patient denies hypoglycemic events and is able to verbalize appropriate hypoglycemia management plan. Patient reports adherence with medication. Control is suboptimal due to meals "not being good" per patient report and decreased exercise due to knee pain. Patient also states she doesn't feel like the glipizide is doing anything. Stop glipizide. Continued basal insulin Lantus (insulin glargine) at 70 units daily. Pt was instructed to reduce to 60 units daily if fasting BG is < 100 after addition of Jardiance. Started Jardiance (empagliflozin) 10 mg daily. Next A1C anticipated 04/2016.  Hypertension -  diagnosed within the past year, BP currently slightly above goal of <150/90 on torsemide 20 mg daily and benazepril 40 mg qAM and 20 mg qPM. Patient reports adherence with medication. Control is suboptimal due to decreased weight, possibly  more salt intake given patient is cooking for herself less.  Given the increased risk of gout flares and metabolic disturbances with HCTZ, we will STOP that medication today. We will also change her benazepril to losartan 100 mg once daily. This change will occur once the patient has run out of her benazepril tablets (likely in a few months). The prescription has already been sent to her pharmacy with instructions to begin taking once benazepril supply is exhausted.  Gout - longstanding history. Most recent uric acid level is 7.5 from 04/24/15. Pt states she has not had any gout flares recently. Consider recheck of uric acid with next lab draw. Can consider reducing allopurinol dose   ASCVD risk greater than 7.5%. Continued Aspirin 81 mg and Continued lovastatin 80 mg.   Written patient instructions provided.  Total time in face to face counseling 40 minutes.   Follow up in Pharmacist Clinic Visit April 27 at 1:30 pm. Patient seen with Rudolpho Sevin, PharmD Candidate and Governor Specking, PharmD Resident.

## 2016-03-30 NOTE — Assessment & Plan Note (Signed)
Hypertension -  diagnosed within the past year, BP currently slightly above goal of <150/90 on torsemide 20 mg daily and benazepril 40 mg qAM and 20 mg qPM. Patient reports adherence with medication. Control is suboptimal due to decreased weight, possibly more salt intake given patient is cooking for herself less.  Given the increased risk of gout flares and metabolic disturbances with HCTZ, we will STOP that medication today. We will also change her benazepril to losartan 100 mg once daily. This change will occur once the patient has run out of her benazepril tablets (likely in a few months). The prescription has already been sent to her pharmacy with instructions to begin taking once benazepril supply is exhausted.

## 2016-03-30 NOTE — Progress Notes (Signed)
Patient ID: Rebecca Walter, female   DOB: 1941/10/07, 75 y.o.   MRN: [id number] Reviewed.  Agree with Dr. Graylin Shiver documentation and management.

## 2016-03-30 NOTE — Assessment & Plan Note (Signed)
Diabetes - longstanding currently with fair control. Patient denies hypoglycemic events and is able to verbalize appropriate hypoglycemia management plan. Patient reports adherence with medication. Control is suboptimal due to meals "not being good" per patient report and decreased exercise due to knee pain. Patient also states she doesn't feel like the glipizide is doing anything. Stop glipizide. Continued basal insulin Lantus (insulin glargine) at 70 units daily. Pt was instructed to reduce to 60 units daily if fasting BG is < 100 after addition of Jardiance. Started Jardiance (empagliflozin) 10 mg daily. Next A1C anticipated 04/2016.

## 2016-04-06 ENCOUNTER — Other Ambulatory Visit: Payer: Self-pay | Admitting: *Deleted

## 2016-04-06 MED ORDER — INSULIN GLARGINE 100 UNIT/ML SOLOSTAR PEN: PEN_INJECTOR | SUBCUTANEOUS | Status: DC

## 2016-04-15 ENCOUNTER — Encounter: Payer: Self-pay | Admitting: Pharmacist

## 2016-04-15 ENCOUNTER — Ambulatory Visit (INDEPENDENT_AMBULATORY_CARE_PROVIDER_SITE_OTHER): Payer: Medicare Other | Admitting: Pharmacist

## 2016-04-15 VITALS — BP 145/56 | HR 83 | Wt 199.0 lb

## 2016-04-15 DIAGNOSIS — E118 Type 2 diabetes mellitus with unspecified complications: Secondary | ICD-10-CM

## 2016-04-15 DIAGNOSIS — I10 Essential (primary) hypertension: Secondary | ICD-10-CM

## 2016-04-15 DIAGNOSIS — M1000 Idiopathic gout, unspecified site: Secondary | ICD-10-CM | POA: Diagnosis not present

## 2016-04-15 LAB — URIC ACID: Uric Acid, Serum: 3.8 mg/dL (ref 2.4–7.0)

## 2016-04-15 MED ORDER — FLUCONAZOLE 150 MG PO TABS: 150.0000 mg | ORAL_TABLET | Freq: Once | ORAL | Status: DC

## 2016-04-15 NOTE — Progress Notes (Signed)
Patient ID: Rebecca Walter, female   DOB: 09/12/1941, 75 y.o.   MRN: [id number] Reviewed: Agree with Dr. Graylin Shiver documentation and management.

## 2016-04-15 NOTE — Assessment & Plan Note (Addendum)
History of Gout - no recent exacerbations.  Recently stopped HCTZ - reevaluate uric acid for gout control.  Reconsider losartan vs benazepril at next visit.   Also reconsider need for allopurinol after HCTZ cessation.    Uric Acid = 3.8  Consider continuing benazepril at the patient's request.  Also consider tapering down and off allopurinol in the future.

## 2016-04-15 NOTE — Assessment & Plan Note (Signed)
Hypertension longstanding currently stable and at goal with use of benazepril 60mg  daily (taken as 40mg  AM and 20mg  PM) and torsemide. (no longer taking HCTZ).  Reassess gout control and uric acid at next visit - then consider use of losartan in place of benazepril.   Patient reports adherence with medication.

## 2016-04-15 NOTE — Progress Notes (Signed)
S:    Patient arrives in good spirits, walking with the assistance of a cane.  Presents for diabetes evaluation, education, and management at the request of Dr. Minda Ditto. Patient was referred on 02/06/2016.  Patient was last seen by Primary Care Provider on 02/06/2016.   Patient reports adherence with medications.  Current diabetes medications include: lantus 70 units and Novolog 10-30 units two-three times per day.  Current hypertension medications include:  Benazepril and torsemide.   Stopped HCTZ as requested.   Patient denies hypoglycemic events.  Patient did report new onset of a yeast infection since starting the Jardiance.   Interested in treatment.     Patient reported dietary habits: Eats 3 meals/day   Patient reported exercise habits:    O:  Lab Results  Component Value Date   HGBA1C 7.2 01/21/2016   There were no vitals filed for this visit.  7 day CBG average:  179   A/P: Diabetes longstanding well controlled with recent change to add Jardiance for worsening glycemic control.  New onset of yeast infection is likely related to [place].  Plan to treat with single dose of fluconazole 150mg  x1  and then assess for symptom resolution.   Patient denied hypoglycemic events and is able to verbalize appropriate hypoglycemia management plan. Patient reports adherence with medication.   No change in diabetes therapy today.   ASCVD risk greater than 7.5%. Continue Aspirin 81 mg and continue lovastatin 40 mg.   Hypertension longstanding currently stable and at goal with use of benazepril 60mg  daily (taken as 40mg  AM and 20mg  PM) and torsemide. (no longer taking HCTZ).  Reassess gout control and uric acid at next visit - then consider use of losartan in place of benazepril.   Patient reports adherence with medication.   Written patient instructions provided.  Total time in face to face counseling 30 minutes.   Follow up in Pharmacist Clinic Visit 2-3   Patient seen with Rudolpho Sevin, PharmD Candidate.

## 2016-04-15 NOTE — Patient Instructions (Signed)
Take fluconazole 150mg  tablet and let us know if your symptoms continue.   Continue same for all other medications.   Follow up in 2-3 weeks in Pharmacy clinic.

## 2016-04-15 NOTE — Assessment & Plan Note (Signed)
Diabetes longstanding well controlled with recent change to add Jardiance for worsening glycemic control.  New onset of yeast infection is likely related to [place].  Plan to treat with single dose of fluconazole 150mg  x1  and then assess for symptom resolution.   Patient denied hypoglycemic events and is able to verbalize appropriate hypoglycemia management plan. Patient reports adherence with medication.   No change in diabetes therapy today.

## 2016-04-26 ENCOUNTER — Other Ambulatory Visit: Payer: Self-pay | Admitting: *Deleted

## 2016-04-27 MED ORDER — ALLOPURINOL 100 MG PO TABS: ORAL_TABLET | ORAL | Status: DC

## 2016-04-27 NOTE — Telephone Encounter (Signed)
2nd request.  Malakye Nolden L, RN  

## 2016-05-06 ENCOUNTER — Encounter: Payer: Self-pay | Admitting: Pharmacist

## 2016-05-06 ENCOUNTER — Ambulatory Visit (INDEPENDENT_AMBULATORY_CARE_PROVIDER_SITE_OTHER): Payer: Medicare Other | Admitting: Pharmacist

## 2016-05-06 VITALS — BP 155/51 | HR 77 | Wt 200.4 lb

## 2016-05-06 DIAGNOSIS — E118 Type 2 diabetes mellitus with unspecified complications: Secondary | ICD-10-CM

## 2016-05-06 MED ORDER — EMPAGLIFLOZIN 25 MG PO TABS: 25.0000 mg | ORAL_TABLET | Freq: Every day | ORAL | Status: DC

## 2016-05-06 NOTE — Patient Instructions (Addendum)
Thank you for coming in today! It was so good seeing you!  Increase Jardiance from 10mg  once daily to 20mg  once daily (take 2 of the 10mg  tablets) until gone. Then increase to 25mg  once daily. We will send this new prescription for you to pick up.  Decrease Lantus dose from 70 units in the morning to 65 units in the morning  Follow-up with Dr. Valentina Lucks in 3-4 weeks.

## 2016-05-06 NOTE — Assessment & Plan Note (Signed)
Diabetes longstanding fairly well controlled with recent addition of Jardiance 10mg  daily and discontinuation of HCTZ. Had yeast infection with initial start of Jardiance which was treated with fluconazole 150mg *1.  Today patient reported that she has not had any other symptoms that were similar to that first occurrence.  Patient denies hypoglycemic events and is able to verbalize appropriate hypoglycemia management plan. Patient reports adherence with medication. She complains of some fluid overload since HCTZ was discontinued. Patient was instructed to increased dose of Jardiance (empagliflozin)  from 10mg  once daily to 20mg  once daily (2 of the 10mg  tablets) until she uses up all of her medication. Then increase to 25mg  once daily.  This may also help with volume status. Will also decrease Lantus to 65 units in the morning.   Consider change to another insulin at next visit.

## 2016-05-06 NOTE — Progress Notes (Signed)
S:    Patient arrives in good spirits ambulating well with the assistance of a cane.  Presents for diabetes follow-up. Patient was last seen by Primary Care Provider on 02/06/16.   Patient reports adherence with medications.  Current diabetes medications include: lantus 70 units and Novolog 5-30 units two-three times per day Current hypertension medications include: benazepril and tosemide States her swelling is worse since stopping HCTZ and also noted that blood sugar worsened when she tried her HCTZ for a few days.   She admits that her torsemide does NOT work as well as in the past.    Patient denies hypoglycemic events.  Patient reported dietary habits: Eats 2-3 meals/day Patient reported exercise habits: limited due to need for cane assistance   Patient denies nocturia.  Patient denies neuropathy. Patient denies visual changes. Patient denies self foot exams.    O:  Lab Results  Component Value Date   HGBA1C 7.2 01/21/2016   Filed Vitals:   05/06/16 1358  BP: 155/51  Pulse: 77    Home 30 day average CBG: 176   A/P: Diabetes longstanding fairly well controlled with recent addition of Jardiance 10mg  daily and discontinuation of HCTZ. Had yeast infection with initial start of Jardiance which was treated with fluconazole 150mg *1.  Today patient reported that she has not had any other symptoms that were similar to that first occurrence.  Patient denies hypoglycemic events and is able to verbalize appropriate hypoglycemia management plan. Patient reports adherence with medication. She complains of some fluid overload since HCTZ was discontinued. Patient was instructed to increased dose of Jardiance (empagliflozin)  from 10mg  once daily to 20mg  once daily (2 of the 10mg  tablets) until she uses up all of her medication. Then increase to 25mg  once daily.  This may also help with volume status. Will also decrease Lantus to 65 units in the morning.   Consider change to another insulin at  next visit.   Next A1C anticipated 1 month.    ASCVD risk greater than 7.5%. Continued Aspirin 81 mg.  Written patient instructions provided.  Total time in face to face counseling 30 minutes.   Follow up in Pharmacist Clinic Visit 3-4 weeks.   Patient seen with Zettie Cooley, PharmD Candidate and Cruz Condon, PharmD Resident.

## 2016-05-07 NOTE — Progress Notes (Signed)
Patient ID: Rebecca Walter, female   DOB: 09/12/1941, 75 y.o.   MRN: [id number] Reviewed: Agree with Dr. Graylin Shiver documentation and management.

## 2016-05-18 ENCOUNTER — Other Ambulatory Visit: Payer: Self-pay | Admitting: *Deleted

## 2016-05-18 DIAGNOSIS — F411 Generalized anxiety disorder: Secondary | ICD-10-CM

## 2016-05-18 MED ORDER — BACLOFEN 10 MG PO TABS: ORAL_TABLET | ORAL | Status: DC

## 2016-05-18 NOTE — Telephone Encounter (Signed)
Cannot refill ativan at this time. She needs appt for follow up. Additionally, would be best for her to follow up with her new pcp regarding ongoing rx of this medication.   CGM MD

## 2016-05-18 NOTE — Telephone Encounter (Signed)
Patient requesting refill on lorazepam. [facility]. Has an appointment with Dr. Valentina Lucks next week

## 2016-05-19 NOTE — Telephone Encounter (Signed)
LM for patient on identified VM.  She has an appt on 05-25-16 and will need to wait until then to discuss this medication with provider. Lujean Ebright,CMA

## 2016-05-20 ENCOUNTER — Other Ambulatory Visit: Payer: Self-pay | Admitting: *Deleted

## 2016-05-21 MED ORDER — METFORMIN HCL ER 500 MG PO TB24: ORAL_TABLET | ORAL | Status: DC

## 2016-05-25 ENCOUNTER — Ambulatory Visit (INDEPENDENT_AMBULATORY_CARE_PROVIDER_SITE_OTHER): Payer: Medicare Other | Admitting: Family Medicine

## 2016-05-25 ENCOUNTER — Encounter: Payer: Self-pay | Admitting: Pharmacist

## 2016-05-25 ENCOUNTER — Ambulatory Visit (INDEPENDENT_AMBULATORY_CARE_PROVIDER_SITE_OTHER): Payer: Medicare Other | Admitting: Pharmacist

## 2016-05-25 VITALS — BP 158/56 | HR 80 | Wt 201.2 lb

## 2016-05-25 DIAGNOSIS — F411 Generalized anxiety disorder: Secondary | ICD-10-CM

## 2016-05-25 DIAGNOSIS — E118 Type 2 diabetes mellitus with unspecified complications: Secondary | ICD-10-CM

## 2016-05-25 DIAGNOSIS — I10 Essential (primary) hypertension: Secondary | ICD-10-CM | POA: Diagnosis not present

## 2016-05-25 LAB — POCT GLYCOSYLATED HEMOGLOBIN (HGB A1C): HEMOGLOBIN A1C: 7.3

## 2016-05-25 MED ORDER — LORAZEPAM 1 MG PO TABS: 1.0000 mg | ORAL_TABLET | Freq: Two times a day (BID) | ORAL | Status: DC | PRN

## 2016-05-25 NOTE — Patient Instructions (Signed)
Thanks for coming in today.   We will continue the same medicines for diabetes. Follow up in 1 month for onoing evaluation with Dr. Valentina Lucks.   Follow up in 2 months with new PCP regarding anxiety and establishment of a treatment plan.   Continue eating healthily and trying to exercise 30 minutes as many days a week as you can.   Thanks for letting us take care of you.   Sincerely, Paula Compton, MD Family Medicine -PGY 2

## 2016-05-25 NOTE — Progress Notes (Signed)
    S:    Patient arrives in good spirits. Patient ambulates well with assistance of a cane. Presents for diabetes evaluation, education, and management at the request of Dr. Marta Antu. Patient was seen by Primary Care Provider today.  Patient reports adherence with medications.  Current diabetes medications include: Empagliflozin (Jardiance) 25 mg daily, Novolog 5-30 units TID with meals, Lantus 65 units in the morning, Metformin XR 500 mg BID with meals. Current hypertension medications include: Benazepril 40 mg in the morning and 20 mg in the evening  Patient denies hypoglycemic events.  O:  Lab Results  Component Value Date   HGBA1C 7.3 05/25/2016   Filed Vitals:   05/25/16 1357  BP: 158/56  Pulse: 80    Home fasting CBG: Average range: 150-200 mg/dL (highest = 222) 2 hour post-prandial/random CBG: Average range: 150-200 (highest = 216)  A/P: Diabetes longstanding currently fairly well controlled with an A1C of 7.3% with recent titration of Jardiance to 25 mg once daily. Patient denies hypoglycemic events Patient reports adherence with medication. Continued basal insulin Lantus (insulin glargine) at 65 units in the morning.  Continued Novolog 5-30 units TID with meals.  Continued Jardiance (empagliflozin) 25 mg once daily. Next A1C anticipated 3 months.   Hypertension longstanding with isolated systolic hypertension and likely near her lowest tolerable BP given BP of 158/58.  Patient reports adherence with medication. Continue Benazepril 40 mg in the morning and 20 mg in the evening.  10 year ASCVD risk > 7.5%. Continued aspirin 81 mg daily and lovastatin 40 mg daily (patient reports taking 1 per day).  Patient reports no recent gout flares. Uric acid level (4/27) decreased to 3.8 since D/C of HCTZ. Continued Allopurinol 200 mg daily.  Patient met with Dr. Minda Ditto today for the last time and will be seeking new PCP for next visit.   Written patient instructions provided.   Total time in face to face counseling 35 minutes.   Follow up in Pharmacist Clinic Visit 1-2 months after next PCP.   Patient seen with Mikael Spray, PharmD Candidate and Joya San, PharmD Resident.

## 2016-05-25 NOTE — Progress Notes (Signed)
Patient ID: Rebecca Walter, female   DOB: 10-22-1941, 75 y.o.   MRN: [id number]   [facility] Aquilla Hacker, MD Phone: [phone]  Subjective:   # Anxiety / Med Refill - symptoms are about the same.  - No changes.  - Ativan 1 mg BID nearly daily.  - Has not done much else in terms of coping foranxeity. Did not want to meet with psych in the past.  - No panic episodes since last visit.   # DMII -  - here for follow up .  - Has been on the empagliflozin.  - also on Novolog 20 units TID - Lantus 60 units qd.  - She has not had any hypoglycemia - No other medication issues.  - No worsening vision changes at this time. - Morning CBG's around 160-170. Mealtime CBG's 150-170.   All relevant systems were reviewed and were negative unless otherwise noted in the HPI  Past Medical History Reviewed problem list.  Medications- reviewed and updated Current Outpatient Prescriptions  Medication Sig Dispense Refill  . allopurinol (ZYLOPRIM) 100 MG tablet TAKE 2 TABLET (200 MG TOTAL) BY MOUTH DAILY. 60 tablet 11  . aspirin 81 MG chewable tablet Chew 1 tablet (81 mg total) by mouth daily. 30 tablet 5  . baclofen (LIORESAL) 10 MG tablet TAKE 1 TABLET BY MOUTH 3 TIMES A DAY AS NEEDED FOR MUSCLE SPASMS 60 tablet 2  . benazepril (LOTENSIN) 20 MG tablet Take 1-2 tablets (20-40 mg total) by mouth 2 (two) times daily. Takes 40mg  in AM and 20mg  in evening. 180 tablet 3  . empagliflozin (JARDIANCE) 25 MG TABS tablet Take 25 mg by mouth daily. 30 tablet 2  . glucose blood (ACCU-CHEK AVIVA PLUS) test strip Check blood sugar first  thing in the morning before eating, after lunch, and  after dinner total 3 times  daily 300 each 3  . ibuprofen (ADVIL,MOTRIN) 200 MG tablet Take 200 mg by mouth daily as needed.    . insulin aspart (NOVOLOG FLEXPEN) 100 UNIT/ML FlexPen INJECT 20 UNITS INTO THE SKIN 3 TIMES DAILY WITH MEALS (Patient taking differently: Inject 5-30 Units into the skin 3  (three) times daily with meals. INJECT 20 UNITS INTO THE SKIN 3 TIMES DAILY WITH MEALS) 15 pen 3  . Insulin Glargine (LANTUS SOLOSTAR) 100 UNIT/ML Solostar Pen INJECT 60 UNITS INTO THE SKIN DAILY. (Patient taking differently: Inject 70 Units into the skin every morning. INJECT 60 UNITS INTO THE SKIN DAILY.) 45 pen 2  . Insulin Pen Needle (B-D ULTRAFINE III SHORT PEN) 31G X 8 MM MISC USE AS DIRECTED 100 each 5  . Lancets (FREESTYLE) lancets Use as directed    . loratadine (CLARITIN) 10 MG tablet Take 1 tablet (10 mg total) by mouth daily. 30 tablet 5  . LORazepam (ATIVAN) 1 MG tablet Take 1 tablet (1 mg total) by mouth 2 (two) times daily as needed for anxiety. Do not fill until 30 days after this date. (Patient taking differently: Take 1 mg by mouth 2 (two) times daily. Do not fill until 30 days after this date.) 60 tablet 0  . lovastatin (MEVACOR) 40 MG tablet TAKE 2 TABLETS BY MOUTH EVERY DAY 60 tablet 5  . metFORMIN (GLUCOPHAGE-XR) 500 MG 24 hr tablet TAKE 1 TABLET BY MOUTH 2 TIMES A DAY BEFORE MEALS 270 tablet 0  . metoCLOPramide (REGLAN) 5 MG tablet Take 1 tablet (5 mg total) by mouth 3 (three) times daily before meals. (Patient  taking differently: Take 5 mg by mouth 4 (four) times daily. ) 90 tablet 5  . Multiple Vitamin (MULTIVITAMIN) tablet Take 1 tablet by mouth daily.      . pantoprazole (PROTONIX) 40 MG tablet TAKE 1 TABLET (40 MG TOTAL) BY MOUTH DAILY. (Patient taking differently: TAKE 1 TABLET (40 MG TOTAL) BY MOUTH TWICE DAILY) 30 tablet 5  . Probiotic Product (PROBIOTIC PEARLS) CAPS Take 1 capsule by mouth once.     . torsemide (DEMADEX) 20 MG tablet Take 20 mg by mouth daily. Reported on 04/15/2016    . traMADol (ULTRAM) 50 MG tablet Take 0.5-1 tablets (25-50 mg total) by mouth every 8 (eight) hours as needed. 30 tablet 3  . vitamin E (NATURAL MIXED TOCOPHEROLS) 400 UNIT capsule Take 400 Units by mouth daily.      No current facility-administered medications for this visit.   Chief  complaint-noted No additions to family history Social history- patient is a non smoker  Objective: There were no vitals taken for this visit. Gen: NAD, alert, cooperative with exam HEENT: NCAT, EOMI, PERRL Neck: FROM, supple CV: RRR, good S1/S2, no murmur Resp: CTABL, no wheezes, non-labored Abd: SNTND, BS present, no guarding or organomegaly Ext: No edema, warm, normal tone, moves UE/LE spontaneously Neuro: Alert and oriented, No gross deficits Skin: no rashes no lesions  Assessment/Plan:  # DMII - no issues since last visit with diabetes. No hypoglycemia.  - Needs vision exam.  - Continue Jardiance, Metformin, Lantus, Novolog as is.  - F/U in 3 weeks. Consider switchin to alternative long acting if CBG's begin to come up.  - A1C again in 3 months.  - Foot exam later this year.   # Anxiety - symptoms are not much worse. Still the same. Requiring Ativan nearly every day. Twice a day. Has not tolerated other medicines in the past. GAD's have been around 3-5.  - Rx Ativan for two months today.  - Will need to f/u with new PCP regarding long term treatment of Anxiety.  - Has not wanted to go on another medicine in the past.

## 2016-05-25 NOTE — Assessment & Plan Note (Signed)
Hypertension longstanding with isolated systolic hypertension and likely near her lowest tolerable BP given BP of 158/58.  Patient reports adherence with medication. Continue Benazepril 40 mg in the morning and 20 mg in the evening.

## 2016-05-25 NOTE — Patient Instructions (Signed)
See Note from Dr. Minda Ditto - Seen Same Day.

## 2016-05-25 NOTE — Assessment & Plan Note (Signed)
Diabetes longstanding currently fairly well controlled with an A1C of 7.3% with recent titration of Jardiance to 25 mg once daily. Patient denies hypoglycemic events Patient reports adherence with medication. Continued basal insulin Lantus (insulin glargine) at 65 units in the morning.  Continued Novolog 5-30 units TID with meals.  Continued Jardiance (empagliflozin) 25 mg once daily. Next A1C anticipated 3 months.

## 2016-05-26 NOTE — Progress Notes (Signed)
Patient ID: Rebecca Walter, female   DOB: 1941-07-29, 75 y.o.   MRN: [id number] Reviewed: Agree with Dr. Graylin Shiver documentation and management.

## 2016-06-02 ENCOUNTER — Other Ambulatory Visit: Payer: Self-pay | Admitting: Family Medicine

## 2016-06-02 NOTE — Telephone Encounter (Signed)
No longer a Dr. Sonnenberg patient 

## 2016-06-15 ENCOUNTER — Other Ambulatory Visit: Payer: Self-pay | Admitting: *Deleted

## 2016-06-15 MED ORDER — INSULIN ASPART 100 UNIT/ML FLEXPEN: PEN_INJECTOR | SUBCUTANEOUS | Status: DC

## 2016-06-15 MED ORDER — INSULIN PEN NEEDLE 31G X 8 MM MISC: Status: DC

## 2016-06-24 ENCOUNTER — Encounter: Payer: Self-pay | Admitting: Pharmacist

## 2016-06-24 ENCOUNTER — Ambulatory Visit (INDEPENDENT_AMBULATORY_CARE_PROVIDER_SITE_OTHER): Payer: Medicare Other | Admitting: Pharmacist

## 2016-06-24 VITALS — BP 148/53 | HR 89 | Wt 198.2 lb

## 2016-06-24 DIAGNOSIS — E118 Type 2 diabetes mellitus with unspecified complications: Secondary | ICD-10-CM | POA: Diagnosis not present

## 2016-06-24 DIAGNOSIS — I10 Essential (primary) hypertension: Secondary | ICD-10-CM

## 2016-06-24 NOTE — Assessment & Plan Note (Signed)
Hypertension longstanding with isolated systolic hypertension and likely near her lowest tolerable BP given BP of 148\53.  Patient reports adherence with medication.  Continued current regimen.

## 2016-06-24 NOTE — Assessment & Plan Note (Signed)
Diabetes longstanding currently fairly well controlled based on home blood glucose readings and most recent A1c of 7.3%.  Patient denies hypoglycemic events.  Patient reports adherence with medication.  Continued basal insulin Lantus (insulin glargine) 65 units daily. Continued rapid insulin Novolog (insulin aspart) 5-30 units before meals.  Continued Jardiance (empagliflozin) 25 mg daily.  Next A1C anticipated in 2 months.

## 2016-06-24 NOTE — Patient Instructions (Signed)
Thank you for coming in today!  Your blood sugars look good and correlate with an A1c of approximately 7%.   Follow up with your primary care provider, Dr. Lincoln Brigham.

## 2016-06-24 NOTE — Progress Notes (Signed)
    S:    Patient arrives in good spirits, ambulating with the assistance of a cane.  Presents for diabetes evaluation, education, and management at the request of Dr. Minda Ditto.  Patient was last seen by Primary Care Provider on 05/25/16.   Diagnosed with diabetes on 1999.    Patient reports adherence with medications.  Current diabetes medications include: Empagliflozin (Jardiance) 25 mg daily, Novolog 5-30 units TID with meals, Lantus 65 units in the morning, Metformin XR 500 mg BID with meals Current hypertension medications include: benazepril 40 mg in the morning and 20 mg in the evening  Patient denies hypoglycemic events.  O:  Lab Results  Component Value Date   HGBA1C 7.3 05/25/2016   There were no vitals filed for this visit.  Home fasting CBG: 140-180s   A/P: Diabetes longstanding currently fairly well controlled based on home blood glucose readings and most recent A1c of 7.3%.  Patient denies hypoglycemic events.  Patient reports adherence with medication.  Continued basal insulin Lantus (insulin glargine) 65 units daily. Continued rapid insulin Novolog (insulin aspart) 5-30 units before meals.  Continued Jardiance (empagliflozin) 25 mg daily.  Next A1C anticipated in 2 months.    ASCVD risk greater than 7.5%. Continued Aspirin 81 mg and Continued lovastatin 40 mg.   Hypertension longstanding with isolated systolic hypertension and likely near her lowest tolerable BP given BP of 148\53.  Patient reports adherence with medication.  Continued current regimen.    Written patient instructions provided.  Total time in face to face counseling 30 minutes.  Follow up with primary care provider, Dr. Lincoln Brigham.  Patient seen with Elisabeth Most, PharmD Resident.

## 2016-06-28 NOTE — Progress Notes (Signed)
Patient ID: Rebecca Walter, female   DOB: 25-Jun-1941, 75 y.o.   MRN: [id number] Reviewed: Agree with Dr. Graylin Shiver documentation and management.

## 2016-06-29 DIAGNOSIS — H33102 Unspecified retinoschisis, left eye: Secondary | ICD-10-CM | POA: Diagnosis not present

## 2016-06-29 DIAGNOSIS — E119 Type 2 diabetes mellitus without complications: Secondary | ICD-10-CM | POA: Diagnosis not present

## 2016-06-29 DIAGNOSIS — H35372 Puckering of macula, left eye: Secondary | ICD-10-CM | POA: Diagnosis not present

## 2016-06-29 DIAGNOSIS — H35411 Lattice degeneration of retina, right eye: Secondary | ICD-10-CM | POA: Diagnosis not present

## 2016-07-14 DIAGNOSIS — H35372 Puckering of macula, left eye: Secondary | ICD-10-CM | POA: Diagnosis not present

## 2016-07-21 DIAGNOSIS — H33102 Unspecified retinoschisis, left eye: Secondary | ICD-10-CM | POA: Diagnosis not present

## 2016-07-23 ENCOUNTER — Telehealth: Payer: Self-pay | Admitting: Student

## 2016-07-23 NOTE — Telephone Encounter (Signed)
Return call to patient regarding having sinus pain.  Patient recently had eye surgery and the pain in on the same side.  Advised patient to call her eye back to let office know she is having sinus pain.  Patient reported taking Tylenol 500 mg at 7 AM this morning with no relief.  Advised patient try normal saline flush if possible. To call for an appointment if the pain does not go away or can not get in contact with the eye doctor. Will forward to PCP for further advise.  Derl Barrow, RN

## 2016-07-23 NOTE — Telephone Encounter (Signed)
Pt has surgery recently and is unable to drive/get here. Pt is having problems with her sinus and would like to know what she could do or buy over the counter. Pt has tried tylenol, but no relief. Please advise. Thanks! ep

## 2016-07-27 ENCOUNTER — Other Ambulatory Visit: Payer: Self-pay | Admitting: *Deleted

## 2016-07-28 MED ORDER — BACLOFEN 10 MG PO TABS: ORAL_TABLET | ORAL | 2 refills | Status: DC

## 2016-08-03 ENCOUNTER — Other Ambulatory Visit: Payer: Self-pay | Admitting: Family Medicine

## 2016-08-03 DIAGNOSIS — E118 Type 2 diabetes mellitus with unspecified complications: Secondary | ICD-10-CM

## 2016-08-04 ENCOUNTER — Encounter: Payer: Self-pay | Admitting: Student

## 2016-08-04 ENCOUNTER — Ambulatory Visit (INDEPENDENT_AMBULATORY_CARE_PROVIDER_SITE_OTHER): Payer: Medicare Other | Admitting: Student

## 2016-08-04 DIAGNOSIS — L0292 Furuncle, unspecified: Secondary | ICD-10-CM | POA: Diagnosis not present

## 2016-08-04 DIAGNOSIS — F411 Generalized anxiety disorder: Secondary | ICD-10-CM | POA: Diagnosis not present

## 2016-08-04 DIAGNOSIS — F419 Anxiety disorder, unspecified: Secondary | ICD-10-CM

## 2016-08-04 MED ORDER — SULFAMETHOXAZOLE-TRIMETHOPRIM 800-160 MG PO TABS: 1.0000 | ORAL_TABLET | Freq: Two times a day (BID) | ORAL | 0 refills | Status: AC

## 2016-08-04 MED ORDER — LORAZEPAM 1 MG PO TABS: 1.0000 mg | ORAL_TABLET | Freq: Two times a day (BID) | ORAL | 0 refills | Status: DC | PRN

## 2016-08-04 NOTE — Addendum Note (Signed)
Addended by: Derl Barrow on: 08/04/2016 04:26 PM   Modules accepted: Orders

## 2016-08-04 NOTE — Telephone Encounter (Signed)
No longer a Dr. Sonnenberg patient 

## 2016-08-04 NOTE — Patient Instructions (Signed)
Follow up in 1 week for cellulitis If you have worsening pain at the cellulitis site, or fevers, call the office for a a sooner appointment We discussed the risk of falls, death or other poor health outcomes including hip fractures with continued use of ativan and you stated you accepted these risk and still requested these medication If you have questions or concerns, call the office at [phone]

## 2016-08-04 NOTE — Telephone Encounter (Signed)
Needs refill on jardiance, tramadol, and benazepril.  [facility]

## 2016-08-04 NOTE — Progress Notes (Addendum)
   Subjective:    Patient ID: Rebecca Walter, female    DOB: [birth year], 75 y.o.   MRN: [id number]   CC: Anxiety, boil  HPI: 75 y/o presenting for anxiety as well concern for boil  Anxiety - takes ativan 1-2 times per day - most often at night for sleep - states that she has tried others meds for anxiety but feels only ativan helps - deneis recent falls  Boil - started as a red rash that started on her right shoulder  1 week ago then a few days started to " come to a head" - she has not tried to burst it - it is tender - denies fevers, chills, N/V/D  Smoking status reviewed  Review of Systems  Per HPI, else denies recent illness, chest pain, shortness of breath, abdominal pain,     Objective:  BP (!) 152/65   Pulse 90   Temp 98.9 F (37.2 C) (Oral)   Wt 201 lb (91.2 kg)   SpO2 92%   BMI 39.26 kg/m  Vitals and nursing note reviewed  General: NAD Cardiac: RRR,  Respiratory: CTAB, normal effort Skin: erythematous area of right shoulder, TTP, warm to touch, approx 3-4 cm in diameter, central induration approx 1.2 cm in diameter with small ( approx 3 mm in diameter) superficial area superficial subcutaneous purulent appearing fluid Neuro: alert and oriented  I&D PROCEDURE NOTE: Written consent obtained. Risks and benefits of the procedure were explained to the patient. Patient made an informed decision to proceed with the procedure. Betadine prep per usual protocol. Local anesthesia obtained with 1% plain lidocaine 3 cc.  2 mm incision made with 11 blade along lesion.  Scant volume purulence expressed. Lesion explored revealing no loculations.  Dressed. Wound care instructions including precautions with patient. Patient tolerated the procedure well.     Assessment & Plan:    Anxiety Ativan refilled. [place] and precautions given to the patient about continuing long term benzodiazepines including risk of injury and death   Boil I&D of small volume  purulent fluid in the office - bactrim for cellulitis - f/u in 1 week to assess for improvement    Loletta Harper A. Lincoln Brigham MD, [facility] Resident PGY-3 Pager [phone]

## 2016-08-05 DIAGNOSIS — L0292 Furuncle, unspecified: Secondary | ICD-10-CM | POA: Insufficient documentation

## 2016-08-05 MED ORDER — EMPAGLIFLOZIN 25 MG PO TABS: 25.0000 mg | ORAL_TABLET | Freq: Every day | ORAL | 2 refills | Status: DC

## 2016-08-05 MED ORDER — TRAMADOL HCL 50 MG PO TABS: 25.0000 mg | ORAL_TABLET | Freq: Three times a day (TID) | ORAL | 3 refills | Status: DC | PRN

## 2016-08-05 NOTE — Assessment & Plan Note (Signed)
Ativan refilled. [place] and precautions given to the patient about continuing long term benzodiazepines including risk of injury and death

## 2016-08-05 NOTE — Assessment & Plan Note (Signed)
I&D of small volume purulent fluid in the office - bactrim for cellulitis - f/u in 1 week to assess for improvement

## 2016-08-06 ENCOUNTER — Telehealth: Payer: Self-pay | Admitting: Internal Medicine

## 2016-08-06 ENCOUNTER — Ambulatory Visit (INDEPENDENT_AMBULATORY_CARE_PROVIDER_SITE_OTHER): Payer: Medicare Other | Admitting: Internal Medicine

## 2016-08-06 VITALS — BP 156/53 | HR 93 | Temp 97.5°F | Wt 196.4 lb

## 2016-08-06 DIAGNOSIS — L0292 Furuncle, unspecified: Secondary | ICD-10-CM | POA: Diagnosis not present

## 2016-08-06 DIAGNOSIS — L0291 Cutaneous abscess, unspecified: Secondary | ICD-10-CM

## 2016-08-06 NOTE — Progress Notes (Addendum)
   Subjective:    Rebecca Walter - 75 y.o. female MRN [id number]  Date of birth: [birth year]  HPI  Rebecca Walter is here for f/u of shoulder boil.  Boil: Patient s/p ID on 8/16 with small volume of purulent fluid drained. Was given Rx for Bactrim which she has been taking; has taken 4 total doses. Reports that now area is now more erythematous and swelling has increased. Very TTP and even to light touch of clothing. Denies fevers, nausea, and vomiting at home.     -  reports that she has never smoked. She has never used smokeless tobacco. - Review of Systems: Per HPI. - Past Medical History: Patient Active Problem List   Diagnosis Date Noted  . Boil 08/05/2016  . Elevated BP 08/22/2015  . Low back strain 03/14/2015  . Difficulty urinating 02/06/2015  . Neck muscle spasm 06/18/2014  . Right shoulder pain 04/25/2014  . Allergic rhinitis 03/22/2011  . INSOMNIA 09/16/2009  . GASTROPARESIS 08/12/2009  . Anxiety 10/18/2008  . SYSTOLIC MURMUR 0000000  . DYSKINESIA, ESOPHAGUS 09/13/2007  . CARCINOMA, BASAL CELL 05/26/2007  . Diabetes mellitus, type II ([place]) 02/16/2007  . HYPERLIPIDEMIA 02/16/2007  . OBESITY, NOS 02/16/2007  . HYPERTENSION, BENIGN SYSTEMIC 02/16/2007  . GASTROESOPHAGEAL REFLUX, NO ESOPHAGITIS 02/16/2007  . OSTEOARTHRITIS, MULTI SITES 02/16/2007  . OSTEOPENIA 02/16/2007  . Gout 02/16/2007   - Medications: reviewed and updated    Objective:   Physical Exam BP (!) 156/53 (BP Location: Left Arm, Patient Position: Sitting, Cuff Size: Normal)   Pulse 93   Temp 97.5 F (36.4 C) (Oral)   Wt 196 lb 6.4 oz (89.1 kg)   SpO2 98%   BMI 38.36 kg/m  Gen: NAD, alert, cooperative with exam, well-appearing Skin: erythematous area of right shoulder, TTP, warm to touch, approx 6-7 cm in diameter, central induration approx 3-4 cm with small amount of draining pus   I&D PROCEDURE NOTE: Written consent obtained. Risks and benefits of the procedure were explained to the  patient. Patient made an informed decision to proceed with the procedure. Betadine prep per usual protocol. Local anesthesia obtained with 1% lidocaine with epi 10 cc.  4 mm incision made with 11 blade along lesion.  Wound culture obtained.  Large volume of purulence expressed. Loculations broken up with hemostat.  Area packed with Iodoform. Dressed. Wound care instructions including precautions with patient. Patient tolerated the procedure well.      Assessment & Plan:   Boil I&D performed today.  -patient to continue with Bactrim and Ibuprofen  -wound culture sent  -f/u on Monday 8/21     Phill Myron, D.O. 08/06/2016, 4:43 PM PGY-2, [place]

## 2016-08-06 NOTE — Patient Instructions (Signed)
Change the outside dressing every 24 hours (or more if you see significant drainage) until your follow up appointment. The packing may come out on its own. Otherwise we will remove it on Monday at your appointment.   Keep the area clean and dry.   Continue to take the antibiotic and ibuprofen for pain relief.

## 2016-08-06 NOTE — Telephone Encounter (Signed)
After Hours/ Emergency Line Call  Received a call from Rebecca Walter regarding bleeding from her shoulder. She had I&D of a shoulder abscess performed on 8/16 with repeat of the I&D on 8/18.  After the I&D, she went to the grocery store to do her shopping. When she got home, she noticed that she had bled through the bandage on her shoulder. She states it is just a small amount of blood in the middle of the bandage that has bled through. She wants to know if this is normal. Advised Pt that this sounds like a normal amount of bleeding to me. If she notices that she soaks the bandage, she should call me back. Red flags discussed.  Will forward to PCP. Pt to follow-up on 8/21 in clinic.  Evette Doffing, MD PGY-2, [facility]

## 2016-08-06 NOTE — Assessment & Plan Note (Addendum)
I&D performed today.  -patient to continue with Bactrim and Ibuprofen  -wound culture sent  -f/u on Monday 8/21

## 2016-08-10 ENCOUNTER — Ambulatory Visit (INDEPENDENT_AMBULATORY_CARE_PROVIDER_SITE_OTHER): Payer: Medicare Other | Admitting: Family Medicine

## 2016-08-10 ENCOUNTER — Encounter: Payer: Self-pay | Admitting: Family Medicine

## 2016-08-10 ENCOUNTER — Telehealth: Payer: Self-pay | Admitting: *Deleted

## 2016-08-10 ENCOUNTER — Other Ambulatory Visit: Payer: Self-pay | Admitting: *Deleted

## 2016-08-10 VITALS — BP 168/56 | HR 80 | Temp 98.5°F | Wt 197.0 lb

## 2016-08-10 DIAGNOSIS — I10 Essential (primary) hypertension: Secondary | ICD-10-CM

## 2016-08-10 DIAGNOSIS — IMO0001 Reserved for inherently not codable concepts without codable children: Secondary | ICD-10-CM

## 2016-08-10 DIAGNOSIS — T814XXD Infection following a procedure, subsequent encounter: Secondary | ICD-10-CM

## 2016-08-10 LAB — WOUND CULTURE: GRAM STAIN: NONE SEEN

## 2016-08-10 MED ORDER — BENAZEPRIL HCL 20 MG PO TABS: 20.0000 mg | ORAL_TABLET | Freq: Two times a day (BID) | ORAL | 3 refills | Status: DC

## 2016-08-10 MED ORDER — CEPHALEXIN 500 MG PO CAPS: 500.0000 mg | ORAL_CAPSULE | Freq: Three times a day (TID) | ORAL | 0 refills | Status: DC

## 2016-08-10 NOTE — Patient Instructions (Addendum)
It was nice seeing you today.  Your wound culture shows Proteus. As discussed with you, we will switch antibiotics. Also Please come see Dr. Juleen China this Friday for recheck at 9:45 am. If no improvement, it might be that you still have the cyst capsule in place needing removal. Follow up as needed.

## 2016-08-10 NOTE — Telephone Encounter (Signed)
Received fax from [facility] stating Benazepril 20 mg has a quantity limit of 2 tablets per day.  Prior authorization is needed for more than 2 tablets per day.  Derl Barrow, RN

## 2016-08-10 NOTE — Progress Notes (Addendum)
Subjective:     Patient ID: Rebecca Walter, female   DOB: 29-Apr-1941, 75 y.o.   MRN: [id number]  HPI Abscess: Here for follow up. She still have pain and discharge from the area. She denies fever. She uses Ibuprofen prn pain. She has 2 more days of Bactrim to complete. [id number] last dose of her meds was this morning. She stated she is compliant with her meds. Denies any other concern.  Current Outpatient Prescriptions on File Prior to Visit  Medication Sig Dispense Refill  . ACCU-CHEK AVIVA PLUS test strip Check blood sugar first  thing in the morning before eating, after lunch, and  after dinner total 3 times  daily 100 each 5  . allopurinol (ZYLOPRIM) 100 MG tablet TAKE 2 TABLET (200 MG TOTAL) BY MOUTH DAILY. 60 tablet 11  . aspirin 81 MG chewable tablet Chew 1 tablet (81 mg total) by mouth daily. 30 tablet 5  . baclofen (LIORESAL) 10 MG tablet TAKE 1 TABLET BY MOUTH 3 TIMES A DAY AS NEEDED FOR MUSCLE SPASMS 60 tablet 2  . benazepril (LOTENSIN) 20 MG tablet Take 1-2 tablets (20-40 mg total) by mouth 2 (two) times daily. Takes 40mg  in AM and 20mg  in evening. 180 tablet 3  . benazepril (LOTENSIN) 40 MG tablet TAKE 1 (40 MG) BY MOUTH DAILY. 90 tablet 3  . empagliflozin (JARDIANCE) 25 MG TABS tablet Take 25 mg by mouth daily. 30 tablet 2  . ibuprofen (ADVIL,MOTRIN) 200 MG tablet Take 200 mg by mouth daily as needed.    . insulin aspart (NOVOLOG FLEXPEN) 100 UNIT/ML FlexPen INJECT 20 UNITS INTO THE SKIN 3 TIMES DAILY WITH MEALS 15 pen 3  . Insulin Glargine (LANTUS SOLOSTAR) 100 UNIT/ML Solostar Pen INJECT 60 UNITS INTO THE SKIN DAILY. (Patient taking differently: Inject 70 Units into the skin every morning. INJECT 60 UNITS INTO THE SKIN DAILY.) 45 pen 2  . Insulin Pen Needle (B-D ULTRAFINE III SHORT PEN) 31G X 8 MM MISC USE AS DIRECTED 100 each 5  . Lancets (FREESTYLE) lancets Use as directed    . loratadine (CLARITIN) 10 MG tablet Take 1 tablet (10 mg total) by mouth daily. 30 tablet 5  .  LORazepam (ATIVAN) 1 MG tablet Take 1 tablet (1 mg total) by mouth 2 (two) times daily as needed for anxiety. 120 tablet 0  . lovastatin (MEVACOR) 40 MG tablet TAKE 2 TABLETS BY MOUTH EVERY DAY 60 tablet 5  . metFORMIN (GLUCOPHAGE-XR) 500 MG 24 hr tablet TAKE 1 TABLET BY MOUTH 2 TIMES A DAY BEFORE MEALS 270 tablet 0  . metoCLOPramide (REGLAN) 5 MG tablet Take 1 tablet (5 mg total) by mouth 3 (three) times daily before meals. (Patient taking differently: Take 5 mg by mouth 4 (four) times daily. ) 90 tablet 5  . Multiple Vitamin (MULTIVITAMIN) tablet Take 1 tablet by mouth daily.      . pantoprazole (PROTONIX) 40 MG tablet TAKE 1 TABLET (40 MG TOTAL) BY MOUTH DAILY. (Patient taking differently: TAKE 1 TABLET (40 MG TOTAL) BY MOUTH TWICE DAILY) 30 tablet 5  . Probiotic Product (PROBIOTIC PEARLS) CAPS Take 1 capsule by mouth once.     . sulfamethoxazole-trimethoprim (BACTRIM DS,SEPTRA DS) 800-160 MG tablet Take 1 tablet by mouth 2 (two) times daily. 14 tablet 0  . torsemide (DEMADEX) 20 MG tablet Take 20 mg by mouth daily. Reported on 04/15/2016    . traMADol (ULTRAM) 50 MG tablet Take 0.5-1 tablets (25-50 mg total) by mouth every 8 (eight)  hours as needed. 30 tablet 3  . vitamin E (NATURAL MIXED TOCOPHEROLS) 400 UNIT capsule Take 400 Units by mouth daily.      No current facility-administered medications on file prior to visit.    Past Medical History:  Diagnosis Date  . Anxiety   . Arthritis   . Diabetes mellitus   . GERD (gastroesophageal reflux disease)   . Gout   . Hyperlipidemia   . Hypertension   . Osteopenia      Review of Systems  Constitutional: Negative for fever.  Respiratory: Negative.   Cardiovascular: Negative.   Skin: Positive for wound.  Neurological: Negative.    . Vitals:   08/10/16 0852  BP: (!) 168/56  Pulse: 80  Temp: 98.5 F (36.9 C)  TempSrc: Oral  SpO2: 93%  Weight: 197 lb (89.4 kg)   Results for orders placed or performed in visit on 08/06/16  WOUND  CULTURE     Status: None   Collection Time: 08/06/16  5:15 PM  Result Value Ref Range Status   Culture Moderate PROTEUS MIRABILIS  Final   Gram Stain Rare  Final   Gram Stain WBC present-both PMN and Mononuclear  Final   Gram Stain No Squamous Epithelial Cells Seen  Final   Gram Stain Few Gram Negative Rods  Final   Organism ID, Bacteria PROTEUS MIRABILIS  Final    Comment: This organism may show imipenem resistance by mechanisms other than a carbapenemase.       Susceptibility   Proteus mirabilis -  (no method available)    AMPICILLIN <=2 Sensitive     AMOX/CLAVULANIC 4 Sensitive     AMPICILLIN/SULBACTAM <=2 Sensitive     PIP/TAZO <=4 Sensitive     IMIPENEM 2 Intermediate     CEFAZOLIN <=4 Not Reportable     CEFTRIAXONE <=1 Sensitive     CEFTAZIDIME <=1 Sensitive     CEFEPIME <=1 Sensitive     GENTAMICIN <=1 Sensitive     TOBRAMYCIN <=1 Sensitive     CIPROFLOXACIN <=0.25 Sensitive     LEVOFLOXACIN <=0.12 Sensitive     TRIMETH/SULFA* <=20 Sensitive      * NR=NOT REPORTABLE,SEE COMMENTORAL therapy:A cefazolin MIC of <32 predicts susceptibility to the oral agents cefaclor,cefdinir,cefpodoxime,cefprozil,cefuroxime,cephalexin,and loracarbef when used for therapy of uncomplicated UTIs due to E.coli,K.pneumomiae,and P.mirabilis. PARENTERAL therapy: A cefazolinMIC of >8 indicates resistance to parenteralcefazolin. An alternate test method must beperformed to confirm susceptibility to parenteralcefazolin.       Objective:   Physical Exam  Constitutional: She is oriented to person, place, and time. She appears well-developed. No distress.  Eyes: Conjunctivae are normal. Pupils are equal, round, and reactive to light.  Cardiovascular: Normal rate, regular rhythm and normal heart sounds.   No murmur heard. Pulmonary/Chest: Effort normal and breath sounds normal. No respiratory distress. She has no wheezes.  Musculoskeletal:       Arms: Neurological: She is alert and oriented to person,  place, and time.  Nursing note and vitals reviewed.        Assessment:     Skin abscess HTN    Plan:     1. Abscess s/p abscess excision. Still looks very infected with large amount of yellowish green discharge.     She has been compliant with Bactrim.     Culture report came back with Proteus with less sensitivity to Bactrim.     As discussed with patient will switch to Keflex for 7 more days.     Continue Ibuprofen  or tylenol as needed for pain.    Procedure done today with verbal consent. Iodoform packing removed and the wound was repacked with a new iodoform gauze under sterile condition.    Anesthesia was established with 1% lidocaine with Epi.    Procedure was well tolerated and pressure dressing was applied.    As discussed with her, if there continue to be drainage from the wound, it could be that the cyst capsule was not totally removed and will need to re-excise it. Will recommend surgical referral in that case vs inpatient IV antibiotic with [facility].    Dr. Juleen China to follow up on this.  2. BP slightly elevated. Likely due to white coat hypertension.     Monitor for now on current BP regimen.     F/U with PCP for further management and recommendations.  Return precaution discussed.

## 2016-08-13 ENCOUNTER — Encounter: Payer: Self-pay | Admitting: Internal Medicine

## 2016-08-13 ENCOUNTER — Ambulatory Visit (INDEPENDENT_AMBULATORY_CARE_PROVIDER_SITE_OTHER): Payer: Medicare Other | Admitting: Internal Medicine

## 2016-08-13 DIAGNOSIS — L0292 Furuncle, unspecified: Secondary | ICD-10-CM

## 2016-08-13 MED ORDER — CEPHALEXIN 500 MG PO CAPS: 500.0000 mg | ORAL_CAPSULE | Freq: Three times a day (TID) | ORAL | 0 refills | Status: AC

## 2016-08-13 NOTE — Patient Instructions (Signed)
I have sent the Keflex (antibiotic) to your pharmacy. Pick it up on Monday if you need additional antibiotic course. If the surrounding area is still very red with significant drainage then you should continue antibiotics. If the redness has continued to improve and the drainage has continued to be minimal you should not need additional treatment.   Put triple antibiotic ointment on the area with your daily dressing change. It will likely take several weeks for the opening to fully heal.   We have scheduled you a follow up appointment for next week. If the area continues to look better you do not need to keep this appointment.   Please ask the front desk about switching your PCP to Dr. Juleen China. Please make an appointment for later in September-October so that we can get to know each other better!

## 2016-08-13 NOTE — Progress Notes (Signed)
   Subjective:    Rebecca Walter - 74 y.o. female MRN [id number]  Date of birth: [birth year]  HPI  Rebecca Walter is here for abscess follow up.  Abscess: On right shoulder. S/p I&D. Wound culture grew Proteus. Patient switched to Keflex at [facility] on 8/22.  She is currently on day 3/7. At OV, Iodoform packing changed. Today, patient and family member reports great improvement in amount of drainage. They also believe surrounding erythema has improved. Have been changing dressings q24h and no longer noting a foul odor with dressing changes. Iodoform packing may have come out with most recent dressing change. No longer having pain in that area. Afebrile at home.   -  reports that she has never smoked. She has never used smokeless tobacco. - Review of Systems: Per HPI. - Past Medical History: Patient Active Problem List   Diagnosis Date Noted  . Boil 08/05/2016  . Elevated BP 08/22/2015  . Low back strain 03/14/2015  . Difficulty urinating 02/06/2015  . Neck muscle spasm 06/18/2014  . Right shoulder pain 04/25/2014  . Allergic rhinitis 03/22/2011  . INSOMNIA 09/16/2009  . GASTROPARESIS 08/12/2009  . Anxiety 10/18/2008  . SYSTOLIC MURMUR 0000000  . DYSKINESIA, ESOPHAGUS 09/13/2007  . CARCINOMA, BASAL CELL 05/26/2007  . Diabetes mellitus, type II ([place]) 02/16/2007  . HYPERLIPIDEMIA 02/16/2007  . OBESITY, NOS 02/16/2007  . HYPERTENSION, BENIGN SYSTEMIC 02/16/2007  . GASTROESOPHAGEAL REFLUX, NO ESOPHAGITIS 02/16/2007  . OSTEOARTHRITIS, MULTI SITES 02/16/2007  . OSTEOPENIA 02/16/2007  . Gout 02/16/2007   - Medications: reviewed and updated    Objective:   Physical Exam BP (!) 177/69 (BP Location: Left Arm, Patient Position: Sitting, Cuff Size: Large)   Pulse 88   Temp 98.3 F (36.8 C) (Oral)   Ht 5' (1.524 m)   Wt 199 lb 3.2 oz (90.4 kg)   BMI 38.90 kg/m  Gen: NAD, alert, cooperative with exam, well-appearing Skin: Previous incision area with granulation tissue and evidence of  healing. No iodoform present in wound. Small amount of milky, yellow drainage present. Area of surrounding erythema much improved. No TTP of the area.      Assessment & Plan:   Boil Area appears to be well healing with marked improvement in surrounding erythema and amount of drainage present. Patient has been compliant with Keflex.  -Rx for 5 additional days of Keflex sent in; discussed reasons that patient should extend abx treatment course  -patient to apply triple antibiotic ointment with dressing changes  -f/u appointment made for next week for further wound monitoring     Phill Myron, D.O. 08/13/2016, 10:12 AM PGY-2, [place]

## 2016-08-13 NOTE — Assessment & Plan Note (Signed)
Area appears to be well healing with marked improvement in surrounding erythema and amount of drainage present. Patient has been compliant with Keflex.  -Rx for 5 additional days of Keflex sent in; discussed reasons that patient should extend abx treatment course  -patient to apply triple antibiotic ointment with dressing changes  -f/u appointment made for next week for further wound monitoring

## 2016-08-17 ENCOUNTER — Encounter: Payer: Self-pay | Admitting: Internal Medicine

## 2016-08-17 ENCOUNTER — Ambulatory Visit (INDEPENDENT_AMBULATORY_CARE_PROVIDER_SITE_OTHER): Payer: Medicare Other | Admitting: Internal Medicine

## 2016-08-17 DIAGNOSIS — L0292 Furuncle, unspecified: Secondary | ICD-10-CM | POA: Diagnosis not present

## 2016-08-17 NOTE — Assessment & Plan Note (Addendum)
Continues to heal very well with almost resolved surrounding erythema and marked improvement in amount of drainage. Patient has two more doses of Keflex to complete her antibiotic course. Do not believe patient needs further antibiotic therapy at this point.  -continue with dressing changes and antibiotic ointment  -red flags of infection reviewed

## 2016-08-17 NOTE — Patient Instructions (Signed)
Good to see you today Rebecca Walter! Your wound is healing very nicely and I do not think you need to pick up the additional antibiotic. Just finish your last two doses.   If the area starts having significant drainage with foul odors, the surrounding skin becomes very red, you develop fevers/nausea/vomiting, or the area becomes enlarged again with tenderness please come back to the clinic as these can be signs of infection.   Continue to keep the area clean and change the dressing daily. Apply Neosporin with your dressing changes.

## 2016-08-17 NOTE — Progress Notes (Signed)
   Subjective:    Rebecca Walter - 74 y.o. female MRN [id number]  Date of birth: Jun 10, 1941  HPI  Rebecca Walter is here for follow up of abscess.  Abscess: On right shoulder. S/p I&D. Wound culture grew Proteus. Patient switched to Keflex at [facility] on 8/22.  She is currently on day 7/7 and has two more pills to take today. Continues to have small amount of drainage without foul odor. No pain to the area. Surrounding erythema continues to improve. Afebrile and overall feeling well at home. Have continued to change dressings daily and place Neosporin the area with dressing changes.   -  reports that she has never smoked. She has never used smokeless tobacco. - Review of Systems: Per HPI. - Past Medical History: Patient Active Problem List   Diagnosis Date Noted  . Boil 08/05/2016  . Elevated BP 08/22/2015  . Low back strain 03/14/2015  . Difficulty urinating 02/06/2015  . Neck muscle spasm 06/18/2014  . Right shoulder pain 04/25/2014  . Allergic rhinitis 03/22/2011  . INSOMNIA 09/16/2009  . GASTROPARESIS 08/12/2009  . Anxiety 10/18/2008  . SYSTOLIC MURMUR 0000000  . DYSKINESIA, ESOPHAGUS 09/13/2007  . CARCINOMA, BASAL CELL 05/26/2007  . Diabetes mellitus, type II ([place]) 02/16/2007  . HYPERLIPIDEMIA 02/16/2007  . OBESITY, NOS 02/16/2007  . HYPERTENSION, BENIGN SYSTEMIC 02/16/2007  . GASTROESOPHAGEAL REFLUX, NO ESOPHAGITIS 02/16/2007  . OSTEOARTHRITIS, MULTI SITES 02/16/2007  . OSTEOPENIA 02/16/2007  . Gout 02/16/2007   - Medications: reviewed and updated    Objective:   Physical Exam BP (!) 149/52   Pulse 80   Temp 98.1 F (36.7 C) (Oral)   Ht 5' (1.524 m)   Wt 195 lb 3.2 oz (88.5 kg)   BMI 38.12 kg/m  Gen: NAD, alert, cooperative with exam, well-appearing Skin: Previous incision area with granulation tissue and appears to be well hearing. Very small amount of yellow drainage present without foul odor. Minimal erythema on one side of incision much improved from previous  office visits. No TTP or areas of fluctuance appreciated.      Assessment & Plan:   Boil Continues to heal very well with almost resolved surrounding erythema and marked improvement in amount of drainage. Patient has two more doses of Keflex to complete her antibiotic course. Do not believe patient needs further antibiotic therapy at this point.  -continue with dressing changes and antibiotic ointment  -red flags of infection reviewed     Phill Myron, D.O. 08/17/2016, 11:51 AM PGY-2, [place]

## 2016-08-20 ENCOUNTER — Ambulatory Visit: Payer: Self-pay | Admitting: Student

## 2016-08-30 ENCOUNTER — Telehealth: Payer: Self-pay | Admitting: Internal Medicine

## 2016-08-30 NOTE — Telephone Encounter (Signed)
Pt had an abcess on her shoulder about 3 weeks ago. She would like to know how long she should keep the bandage on. Please advise

## 2016-08-30 NOTE — Telephone Encounter (Signed)
Pt informed. She will wear a band aid over the area when wearing clothing. Ottis Stain, CMA

## 2016-08-30 NOTE — Telephone Encounter (Signed)
If the area is healing well and it does not bother her to not have a bandage on she can probably go without it. She just needs to keep the area clean. If she is worried about the area becoming dirty or irritated by clothing she can place a very small bandage over the area. She does not need to continue dressing it with large guaze pads at this time.   Phill Myron, D.O. 08/30/2016, 3:42 PM PGY-2, [place]

## 2016-09-08 ENCOUNTER — Ambulatory Visit (INDEPENDENT_AMBULATORY_CARE_PROVIDER_SITE_OTHER): Payer: Medicare Other | Admitting: Internal Medicine

## 2016-09-08 ENCOUNTER — Encounter: Payer: Self-pay | Admitting: Internal Medicine

## 2016-09-08 VITALS — BP 154/65 | HR 85 | Temp 98.2°F | Wt 195.0 lb

## 2016-09-08 DIAGNOSIS — F411 Generalized anxiety disorder: Secondary | ICD-10-CM | POA: Diagnosis not present

## 2016-09-08 DIAGNOSIS — E118 Type 2 diabetes mellitus with unspecified complications: Secondary | ICD-10-CM | POA: Diagnosis not present

## 2016-09-08 LAB — POCT GLYCOSYLATED HEMOGLOBIN (HGB A1C): HEMOGLOBIN A1C: 7.6

## 2016-09-08 MED ORDER — INSULIN ASPART 100 UNIT/ML FLEXPEN: PEN_INJECTOR | SUBCUTANEOUS | 3 refills | Status: DC

## 2016-09-08 MED ORDER — INSULIN GLARGINE 100 UNIT/ML SOLOSTAR PEN: 70.0000 [IU] | PEN_INJECTOR | SUBCUTANEOUS | 2 refills | Status: DC

## 2016-09-08 MED ORDER — LORAZEPAM 1 MG PO TABS: 1.0000 mg | ORAL_TABLET | Freq: Two times a day (BID) | ORAL | 0 refills | Status: DC | PRN

## 2016-09-08 MED ORDER — EXENATIDE 5 MCG/0.02ML ~~LOC~~ SOPN: 5.0000 ug | PEN_INJECTOR | Freq: Two times a day (BID) | SUBCUTANEOUS | 0 refills | Status: DC

## 2016-09-08 NOTE — Progress Notes (Signed)
   Subjective:    Rebecca Walter - 75 y.o. female MRN [id number]  Date of birth: Jan 05, 1941  HPI  Rebecca Walter is here for diabetes follow up.  Diabetes mellitus, Type 2 Disease Monitoring Blood Sugar Ranges: Fasting - 140-170; Postprandial: 150-180  Patient reports CBGs have been elevated previously in the past few months because of bad  abscess and infection.  Polyuria: No  Visual problems: no  Urine Microalbumin On AceInhibitor   Last A1C: 7.6   Medication Compliance: yes with Metformin 500 mg BID (a lot of GI side effects when increased this medication), Lantus 70u in the AM, Novolog 35u TID, and Jardiance 25 mg daily  Medication Side Effects Hypoglycemia: no   Preventitive Health Care Eye Exam: Patient is due for eye exam. Plans to make appointment.  Foot Exam: Completed today.     Health Maintenance Due  Topic Date Due  . ZOSTAVAX  09/05/1999  . PNA vac Low Risk Adult (2 of 2 - PCV13) 10/20/2005  . TETANUS/TDAP  10/20/2012  . FOOT EXAM  11/23/2013  . OPHTHALMOLOGY EXAM  05/01/2015  . INFLUENZA VACCINE  07/20/2016    -  reports that she has never smoked. She has never used smokeless tobacco. - Review of Systems: Per HPI. - Past Medical History: Patient Active Problem List   Diagnosis Date Noted  . Boil 08/05/2016  . Elevated BP 08/22/2015  . Low back strain 03/14/2015  . Difficulty urinating 02/06/2015  . Neck muscle spasm 06/18/2014  . Right shoulder pain 04/25/2014  . Allergic rhinitis 03/22/2011  . INSOMNIA 09/16/2009  . GASTROPARESIS 08/12/2009  . Anxiety 10/18/2008  . SYSTOLIC MURMUR 0000000  . DYSKINESIA, ESOPHAGUS 09/13/2007  . CARCINOMA, BASAL CELL 05/26/2007  . Diabetes mellitus, type II ([place]) 02/16/2007  . HYPERLIPIDEMIA 02/16/2007  . OBESITY, NOS 02/16/2007  . HYPERTENSION, BENIGN SYSTEMIC 02/16/2007  . GASTROESOPHAGEAL REFLUX, NO ESOPHAGITIS 02/16/2007  .  OSTEOARTHRITIS, MULTI SITES 02/16/2007  . OSTEOPENIA 02/16/2007  . Gout 02/16/2007   - Medications: reviewed and updated    Objective:   Physical Exam BP (!) 154/65   Pulse 85   Temp 98.2 F (36.8 C) (Oral)   Wt 195 lb (88.5 kg)   SpO2 95%   BMI 38.08 kg/m  Gen: NAD, alert, cooperative with exam, well-appearing Skin: Area of abscess drainage on right shoulder is well healing. Small incision left approximately 1-2 mm with good skin approximation and no surrounding edema or erythema.    Diabetic Foot Check -  Appearance - no lesions, ulcers or calluses Skin - no unusual pallor or redness Monofilament testing - normal bilaterally  Right - Great toe, medial, central, lateral ball and posterior foot intact Left - Great toe, medial, central, lateral ball and posterior foot intact       Assessment & Plan:   Diabetes mellitus, type II Remains fairly well controlled with most recent A1C of 7.6. Patient reporting concerns that Vania Rea is not working. Discussed with Dr. Valentina Lucks and will switch from Jardiance to By[place]. Patient is to continue Jardiance until follow up. She will see Dr. Valentina Lucks in the next week and bring Byetta to appointment for medication education. Continued Lantus 70 units daily and Novolog 35 units TID. Will not adjust metformin dose given reported GI intolerance at higher doses. Follow up with PCP in 3 months for A1C.     Phill Myron, D.O. 09/08/2016, 3:27 PM PGY-2, [place]

## 2016-09-08 NOTE — Assessment & Plan Note (Signed)
Remains fairly well controlled with most recent A1C of 7.6. Patient reporting concerns that Rebecca Walter is not working. Discussed with Dr. Valentina Lucks and will switch from Jardiance to [place]. Patient is to continue Jardiance until follow up. She will see Dr. Valentina Lucks in the next week and bring Byetta to appointment for medication education. Continued Lantus 70 units daily and Novolog 35 units TID. Will not adjust metformin dose given reported GI intolerance at higher doses. Follow up with PCP in 3 months for A1C.

## 2016-09-08 NOTE — Patient Instructions (Signed)
Please make an appointment with Dr. Valentina Lucks for later this week or next week for education about Byetta. He will show you how to use the medication so please pick it up from the pharmacy and bring it with you.   Please return for follow up with Dr. Juleen China in 3 months.

## 2016-09-16 ENCOUNTER — Ambulatory Visit (INDEPENDENT_AMBULATORY_CARE_PROVIDER_SITE_OTHER): Payer: Medicare Other | Admitting: Pharmacist

## 2016-09-16 ENCOUNTER — Encounter: Payer: Self-pay | Admitting: Pharmacist

## 2016-09-16 VITALS — BP 163/53 | HR 89 | Wt 201.2 lb

## 2016-09-16 DIAGNOSIS — I10 Essential (primary) hypertension: Secondary | ICD-10-CM | POA: Diagnosis not present

## 2016-09-16 DIAGNOSIS — E118 Type 2 diabetes mellitus with unspecified complications: Secondary | ICD-10-CM

## 2016-09-16 LAB — BASIC METABOLIC PANEL
BUN: 22 mg/dL (ref 7–25)
CHLORIDE: 100 mmol/L (ref 98–110)
CO2: 26 mmol/L (ref 20–31)
Calcium: 9.9 mg/dL (ref 8.6–10.4)
Creat: 0.93 mg/dL (ref 0.60–0.93)
Glucose, Bld: 168 mg/dL — ABNORMAL HIGH (ref 65–99)
Potassium: 4.3 mmol/L (ref 3.5–5.3)
SODIUM: 140 mmol/L (ref 135–146)

## 2016-09-16 NOTE — Patient Instructions (Addendum)
Continue taking Novolog 35 units prior to breakfast and dinner Continue taking Lantus 70 units once daily  Followup with Dr Juleen China in 1 month

## 2016-09-16 NOTE — Assessment & Plan Note (Signed)
Hypertension longstanding diagnosed currently uncontrolled.  Patient reports adherence with medication. Will continue to monitor and may consider goal of <150/90 due to wide pulse pressure and low diastolic. Will order BMET to monitor electrolytes and renal function as patient has complaints of dry skin. Counseled on rising up slowly from sitting and in bed to limit signs/symptoms of orthostasis. May consider dosing the Demedex less than daily at follow up visit as the Jardiance has some diuretic effect.

## 2016-09-16 NOTE — Assessment & Plan Note (Signed)
Diabetes longstanding diagnosed currently above goal at A1C of 7.6%. Patient denies hypoglycemic events and is able to verbalize appropriate hypoglycemia management plan. Patient reports adherence with medication. Continued basal insulin Lantus (insulin glargine) 70 units daily. Continued rapid insulin Novolog (insulin aspart) 35 units before breakfast and dinner. Counseled patient on importance of administering Novolog before meals.  Will not start Byetta at this time due to patient preference and improvement in home blood glucose over the past 2 weeks following resolution of infection.  Will continue Empagliflozin and metformin XR. Next A1C anticipated 3 months.

## 2016-09-16 NOTE — Progress Notes (Deleted)
    S:    Patient arrives in good spirits ambulating with a cane.  Presents for diabetes evaluation, education, and management at the request of Dr Juleen China. Patient has been previously seen in pharmacy clinic but was referred on 09/08/16 for Byetta education.  Patient was last seen by Primary Care Provider on 09/08/16.   She states that she does not want to start Byetta today as since her wound abscess has healed her blood glucose has improved. She had previously stated that empagliflozin was not working but now believes it is helping her blood glucose after her wound has healed.   Patient reports adherence with medications.  Current diabetes medications include: empagliflozin 25 mg daily, Lantus 70 units daily, Novolog 35 units after breakfast and dinner, metformin xr 500 mg BID Current hypertension medications include: benazepril 20 mg QAM and 40 mg QPM, torsemide 20 mg daily   Patient denies hypoglycemic events.  Patient reported dietary habits: Eats 3 meals/day Breakfast:scrambled eggs, sausage, piece of toast, yogurt with strawberries and cottage cheese, juice Lunch:2 pm eats a sandwich Dinner:largest meal     Patient reports nocturia 1x/night.  She denies pain/burning upon urination but states she has has 2 yeast infections since starting empagliflozin but the last yeast infection may be related to antibiotic use. Patient denies neuropathy.  She reports infrequent (<1 time per month) episodes of dizziness upon standing.  Denies lower extremity edema.  Patient states that sometimes that her skin gets very dry.   O:  Lab Results  Component Value Date   HGBA1C 7.6 09/08/2016   Vitals:   09/16/16 1356  BP: (!) 163/53  Pulse: 89    CBG 7 day average 177 mg/dL. CBG 14 day average 176 mg/dL.    A/P: Diabetes longstanding diagnosed currently above goal at A1C of 7.6%. Patient denies hypoglycemic events and is able to verbalize appropriate hypoglycemia management plan. Patient  reports adherence with medication.   Continued basal insulin Lantus (insulin glargine) 70 units daily. Continued rapid insulin Novolog (insulin aspart) 35 units before breakfast and dinner. Counseled patient on importance of administering Novolog before meals.  Will not start Byetta at this time due to patient preference and improvement in home blood glucose over the past 2 weeks.  Will continue Empagliflozin and metformin xr. Next A1C anticipated 3 months.    ASCVD risk greater than 7.5%. Continued Aspirin 81 mg and Continued lovastatin 40 mg daily.  Hypertension longstanding diagnosed currently uncontrolled.  Patient reports adherence with medication. Will continue to monitor and may consider goal of <150/90 due to wide pulse pressure and low diastolic.  Written patient instructions provided.  Total time in face to face counseling 35 minutes.   Follow up with Dr Juleen China in 1-2 months.   Patient seen with .

## 2016-09-16 NOTE — Progress Notes (Signed)
   S:    Patient arrives in good spirits ambulating with a cane.  Presents for diabetes evaluation, education, and management at the request of Dr Juleen China. Patient has been previously seen in pharmacy clinic but was referred on 09/08/16 for Byetta education.  Patient was last seen by Primary Care Provider on 09/08/16.   She states that she does not want to start Byetta today as since her wound abscess has healed her blood glucose has improved. She had previously stated that empagliflozin was not working but now believes it is helping her blood glucose after her wound has healed.   Patient reports adherence with medications.  Current diabetes medications include: empagliflozin 25 mg daily, Lantus 70 units daily, Novolog 35 units after breakfast and after dinner, metformin XR 500 mg BID Current hypertension medications include: benazepril 20 mg QAM and 40 mg QPM, torsemide 20 mg daily   Patient denies hypoglycemic events.  Patient reported dietary habits: Eats 3 meals/day Breakfast:scrambled eggs, sausage, piece of toast, yogurt with strawberries and cottage cheese, juice Lunch:2 pm eats a sandwich Dinner:largest meal   Patient reports nocturia 1x/night.  She denies pain/burning upon urination but states she has has 2 yeast infections since starting empagliflozin but the last yeast infection may be related to antibiotic use. Patient denies neuropathy.  She reports infrequent (<1 time per month) episodes of dizziness upon standing.  Denies lower extremity edema.  Patient states that sometimes that her skin gets very dry.   O:  Recent Labs       Lab Results  Component Value Date   HGBA1C 7.6 09/08/2016        Vitals:   09/16/16 1356  BP: (!) 163/53  Pulse: 89    CBG 7 day average 177 mg/dL. CBG 14 day average 176 mg/dL.    A/P: Diabetes longstanding diagnosed currently above goal at A1C of 7.6%. Patient denies hypoglycemic events and is able to verbalize appropriate  hypoglycemia management plan. Patient reports adherence with medication. Continued basal insulin Lantus (insulin glargine) 70 units daily. Continued rapid insulin Novolog (insulin aspart) 35 units before breakfast and dinner. Counseled patient on importance of administering Novolog before meals.  Will not start Byetta at this time due to patient preference and improvement in home blood glucose over the past 2 weeks following resolution of infection.  Will continue Empagliflozin and metformin XR. Next A1C anticipated 3 months.    ASCVD risk greater than 7.5%. Continued Aspirin 81 mg and Continued lovastatin 40 mg daily. Would consider obtaining direct LDL or lipid panel at next visit.   Hypertension longstanding diagnosed currently uncontrolled.  Patient reports adherence with medication. Will continue to monitor and may consider goal of <150/90 due to wide pulse pressure and low diastolic. Will order BMET to monitor electrolytes and renal function as patient has complaints of dry skin. Counseled on rising up slowly from sitting and in bed to limit signs/symptoms of orthostasis. May consider dosing the Demedex less than daily at follow up visit as the Jardiance has some diuretic effect.   Written patient instructions provided.  Total time in face to face counseling 35 minutes.   Follow up with Dr Juleen China in 1-2 months. Patient seen with Mechele Dawley, PharmD Candidate, Joellyn Haff, PharmD Candidate, and Bennye Alm, PharmD Resident.

## 2016-09-17 NOTE — Progress Notes (Signed)
Patient ID: Rebecca Walter, female   DOB: 08/19/1941, 75 y.o.   MRN: [id number] Reviewed: Agree with Dr. Koval's documentation and management. 

## 2016-09-21 DIAGNOSIS — H35372 Puckering of macula, left eye: Secondary | ICD-10-CM | POA: Diagnosis not present

## 2016-09-24 ENCOUNTER — Other Ambulatory Visit: Payer: Self-pay | Admitting: *Deleted

## 2016-10-05 ENCOUNTER — Other Ambulatory Visit: Payer: Self-pay | Admitting: *Deleted

## 2016-10-05 MED ORDER — TRAMADOL HCL 50 MG PO TABS: 25.0000 mg | ORAL_TABLET | Freq: Three times a day (TID) | ORAL | 3 refills | Status: DC | PRN

## 2016-10-05 MED ORDER — METFORMIN HCL ER 500 MG PO TB24: ORAL_TABLET | ORAL | 3 refills | Status: DC

## 2016-10-05 NOTE — Telephone Encounter (Signed)
Pt notified. Sharon T Saunders, CMA   

## 2016-10-05 NOTE — Telephone Encounter (Signed)
Have sent in refills for Metformin and Tramadol.   Phill Myron, D.O. 10/05/2016, 2:50 PM PGY-2, [place]

## 2016-10-19 ENCOUNTER — Other Ambulatory Visit: Payer: Self-pay | Admitting: Internal Medicine

## 2016-10-25 ENCOUNTER — Other Ambulatory Visit: Payer: Self-pay | Admitting: Student

## 2016-10-27 ENCOUNTER — Ambulatory Visit (INDEPENDENT_AMBULATORY_CARE_PROVIDER_SITE_OTHER): Payer: Medicare Other | Admitting: Internal Medicine

## 2016-10-27 ENCOUNTER — Encounter: Payer: Self-pay | Admitting: Internal Medicine

## 2016-10-27 ENCOUNTER — Ambulatory Visit
Admission: RE | Admit: 2016-10-27 | Discharge: 2016-10-27 | Disposition: A | Discharge status: Home / Self Care | Payer: Medicare Other | Source: Ambulatory Visit | Attending: Family Medicine | Admitting: Family Medicine

## 2016-10-27 VITALS — BP 170/64 | HR 86 | Temp 98.5°F | Ht 60.0 in | Wt 204.2 lb

## 2016-10-27 DIAGNOSIS — M5441 Lumbago with sciatica, right side: Secondary | ICD-10-CM | POA: Diagnosis not present

## 2016-10-27 DIAGNOSIS — M5416 Radiculopathy, lumbar region: Secondary | ICD-10-CM

## 2016-10-27 DIAGNOSIS — M47816 Spondylosis without myelopathy or radiculopathy, lumbar region: Secondary | ICD-10-CM | POA: Diagnosis not present

## 2016-10-27 DIAGNOSIS — M5417 Radiculopathy, lumbosacral region: Secondary | ICD-10-CM | POA: Diagnosis not present

## 2016-10-27 DIAGNOSIS — M25551 Pain in right hip: Secondary | ICD-10-CM | POA: Diagnosis not present

## 2016-10-27 MED ORDER — KETOROLAC TROMETHAMINE 60 MG/2ML IM SOLN
60.0000 mg | Freq: Once | INTRAMUSCULAR | Status: AC
  Administered 2016-10-27: 60 mg via INTRAMUSCULAR

## 2016-10-27 MED ORDER — PREDNISONE 50 MG PO TABS: ORAL_TABLET | ORAL | 0 refills | Status: DC

## 2016-10-27 NOTE — Patient Instructions (Signed)
Take the prednisone for 5 days. Monitor your blood sugars with this medication and let me know if your fasting blood sugars are greater than 250.   Please go to [facility] to get the Xray of your back and hips. I will call you with the results.   We could consider getting an MRI of your back if you would be willing to have the steroid injections done in your back.   Hope you feel better soon!   Dr. Juleen China

## 2016-10-27 NOTE — Progress Notes (Signed)
   Subjective:    Rebecca Walter - 75 y.o. female MRN [id number]  Date of birth: 07-30-1941  HPI  Rebecca Walter is here for SDA for back pain.  BACK PAIN  Back pain began 4 days ago. Pain is described as sharp pain across her right lower back. Patient has tried Tramadol (chronic medication) and Tylenol. Pain radiates across her right hip and down her right leg. Shooting pain down the extremity.  History of trauma or injury: No Prior history of similar pain: Yes. Told she had sciatica in the past.  History of cancer: No Weak immune system:  No History of IV drug use: No History of steroid use: No  Symptoms Incontinence of bowel or bladder:  No Numbness of leg: no Fever: no Rest or Night pain: yes  Weight Loss:  no Rash: no  Patient believes sciatica might be causing their pain.  -  reports that she has never smoked. She has never used smokeless tobacco. - Review of Systems: Per HPI. - Past Medical History: Patient Active Problem List   Diagnosis Date Noted  . Lumbar back pain with radiculopathy affecting right lower extremity 10/29/2016  . Boil 08/05/2016  . Elevated BP 08/22/2015  . Low back strain 03/14/2015  . Difficulty urinating 02/06/2015  . Neck muscle spasm 06/18/2014  . Right shoulder pain 04/25/2014  . Allergic rhinitis 03/22/2011  . INSOMNIA 09/16/2009  . GASTROPARESIS 08/12/2009  . Anxiety 10/18/2008  . SYSTOLIC MURMUR 0000000  . DYSKINESIA, ESOPHAGUS 09/13/2007  . CARCINOMA, BASAL CELL 05/26/2007  . Diabetes mellitus, type II ([place]) 02/16/2007  . HYPERLIPIDEMIA 02/16/2007  . OBESITY, NOS 02/16/2007  . HYPERTENSION, BENIGN SYSTEMIC 02/16/2007  . GASTROESOPHAGEAL REFLUX, NO ESOPHAGITIS 02/16/2007  . OSTEOARTHRITIS, MULTI SITES 02/16/2007  . OSTEOPENIA 02/16/2007  . Gout 02/16/2007   - Medications: reviewed and updated   Objective:   Physical Exam BP (!) 170/64   Pulse 86   Temp 98.5 F (36.9 C) (Oral)   Ht 5' (1.524 m)   Wt 204 lb 3.2 oz  (92.6 kg)   SpO2 97%   BMI 39.88 kg/m  Gen: NAD, alert, cooperative with exam, appears uncomfortable secondary to pain  MSK: TTP over right lumbar paraspinal muscles and over right greater trochanter. No overlying rash or erythema.  Neuro: Strength 5/5 in LE bilaterally. Walks slowly secondary to pain. Negative log roll test. Positive straight leg test of right.    Assessment & Plan:   Lumbar back pain with radiculopathy affecting right lower extremity Concern for disc herniation due to radicular pain. Greater trochanter bursitis of the right hip could also be considered given the pain to palpation. Will obtain lumbar and pelvic xrays. Will do Prednisone 50 mg x5 days. Patient to monitor CBGs during this time and alert PCP regarding hyperglycemia. Toradol 60 mg IM given today in clinic. Continue home Tramadol and tylenol prn.     Phill Myron, D.O. 10/29/2016, 2:57 PM PGY-2, [place]

## 2016-10-29 ENCOUNTER — Telehealth: Payer: Self-pay | Admitting: Internal Medicine

## 2016-10-29 DIAGNOSIS — M5441 Lumbago with sciatica, right side: Secondary | ICD-10-CM

## 2016-10-29 DIAGNOSIS — M5416 Radiculopathy, lumbar region: Secondary | ICD-10-CM | POA: Insufficient documentation

## 2016-10-29 MED ORDER — TRAMADOL HCL 50 MG PO TABS: 50.0000 mg | ORAL_TABLET | Freq: Three times a day (TID) | ORAL | 0 refills | Status: DC | PRN

## 2016-10-29 NOTE — Assessment & Plan Note (Addendum)
Concern for disc herniation due to radicular pain. Greater trochanter bursitis of the right hip could also be considered given the pain to palpation. Will obtain lumbar and pelvic xrays. Will do Prednisone 50 mg x5 days. Patient to monitor CBGs during this time and alert PCP regarding hyperglycemia. Toradol 60 mg IM given today in clinic. Continue home Tramadol and tylenol prn.

## 2016-10-29 NOTE — Telephone Encounter (Signed)
Called to discuss Xray results with patient. Given radicular pain with potential option of epidural steroid injections, will order MRI lumbar spine.   Have increased patient's Tramadol to 50-100 mg q8h prn. This prescription was called into her pharmacy.   Phill Myron, D.O. 10/29/2016, 3:19 PM PGY-2, [place]

## 2016-10-29 NOTE — Telephone Encounter (Signed)
Pt would like results from her xrays. She is still in pain and would like something for pain, she says the pain shot dr Juleen China gave her didn't do any good.  She would like to be called back today since we are closed tomorrow.

## 2016-10-31 ENCOUNTER — Other Ambulatory Visit: Payer: Self-pay | Admitting: Internal Medicine

## 2016-11-01 ENCOUNTER — Telehealth: Payer: Self-pay | Admitting: Internal Medicine

## 2016-11-01 ENCOUNTER — Ambulatory Visit (INDEPENDENT_AMBULATORY_CARE_PROVIDER_SITE_OTHER): Payer: Medicare Other | Admitting: Internal Medicine

## 2016-11-01 ENCOUNTER — Encounter: Payer: Self-pay | Admitting: Internal Medicine

## 2016-11-01 VITALS — BP 166/55 | HR 80 | Temp 98.1°F | Resp 18 | Wt 205.4 lb

## 2016-11-01 DIAGNOSIS — G8929 Other chronic pain: Secondary | ICD-10-CM

## 2016-11-01 DIAGNOSIS — M5441 Lumbago with sciatica, right side: Secondary | ICD-10-CM | POA: Diagnosis not present

## 2016-11-01 DIAGNOSIS — I10 Essential (primary) hypertension: Secondary | ICD-10-CM

## 2016-11-01 DIAGNOSIS — M25551 Pain in right hip: Secondary | ICD-10-CM | POA: Diagnosis not present

## 2016-11-01 MED ORDER — NAPROXEN 500 MG PO TABS: 500.0000 mg | ORAL_TABLET | Freq: Two times a day (BID) | ORAL | 1 refills | Status: DC

## 2016-11-01 NOTE — Telephone Encounter (Signed)
**  After Hours/ Emergency Line Call*  Received a call to report that Rebecca Walter is having shortness of breath. She states she does not have any problems with her lungs. Her blood pressure is 194/86 and she has never seen it this high. She is not having any chest pain. I advised her to call EMS to bring her to the emergency department for further evaluation. She voiced understanding.   Will forward to PCP.  Evette Doffing, MD PGY-2, [place] [facility]

## 2016-11-01 NOTE — Telephone Encounter (Signed)
Called pt back.  She is "winded" when she gets to the phone, but states that is because is is rushing from the bathroom.  She did call EMS this am (7:30) because she was sweaty (sheets were soaked) and thought she was having a reaction to Tramadol that she took @ 1:30am.  Per pt, her BP was ~190/80 at first and ~170/80 on recheck. Her EKG was normal and her blood glucose was 159.  Pt states that she is feeling better, but I advised that since she was still short of breath that she should be evaluated.  Advised that I had a 2:15 opening here or she should got to the ED.  Pt is hesitant to both but reluctantly agrees to appt here "if she can get a ride".  She will call back if she cant make it.  Pt given red Flags to go to ED if she cant come today or the occur before appt. These include Chest pain, jaw pain, arm pain, worsening SOB, nausea, headache.  Will forward to PCP and afternoon provider. Dereon Williamsen, Salome Spotted, CMA

## 2016-11-01 NOTE — Patient Instructions (Signed)
Rebecca Walter,  I recommend trying naproxen 500 mg up to twice daily as needed for inflammation for 2 weeks. By that time you will have had your MRI, and treatment can be better tailored to those findings. I recommend reducing tramadol to 25-50 mg a dose. Please finish the dose of steroids prescribed by Dr. Juleen China.  Pain can increase blood pressure. I suspect this caused your high reading this morning.  Best, Dr. Ola Spurr

## 2016-11-01 NOTE — Telephone Encounter (Signed)
Pt had to call EMS this morning. Pt had high BP and shortness of breath. Pt declined to be brought in for evaluation. Pt believes the shortness of breath is from the tramadol. Pt states she is still in a lot of pain. Pt wants Dr. Juleen China to call her today. Please advise. Thanks! ep

## 2016-11-01 NOTE — Progress Notes (Signed)
[facility] Progress Note  Subjective:  Rebecca Walter is a 75 y.o. female with history chronic back pain who presents for SDA after having BP elevated to 198/89, per patient, waking up sweating and having some difficulty breathing this a.m. She did not have chest pain and thinks the breathing difficulty started after seeing how high her BP was. In addition her hip was hurting more at the time. She said she normally has SBPs in the 160s. She called EMS and had BP around 190/80 at first and around 170/80 on recheck. Per report, her EKG was normal and blood glucose was 159.   She had been taking tramadol twice daily at increased dose of 100 mg, starting Friday night, and had taken an extra 100 mg overnight Monday morning. She says she used to take 25 mg about every 6 hours but dose increased due to worsening back/right hip pain. She is on her second to last day of 5-day steroid burst. She normally takes ativan BID but had not taken any doses today. She says tylenol does not touch her pain. Xray on 10/27/16 showed no injuries of R pelvis but DDD and disc height loss at L4-5 and L5-S1. She is scheduled for MRI lumbar spine on 11/22.   ROS: No bowel or bladder incontinence. No headaches. No n/v.    Objective: Pulse 80, temperature 98.1 F (36.7 C), temperature source Oral, resp. rate 18, weight 205 lb 6.4 oz (93.2 kg), SpO2 93 %. Body mass index is 40.11 kg/m. Constitutional: Obese female Cardiovascular: RRR, S1, S2, 2/6 systolic ejection murmur Pulmonary/Chest: Effort normal and breath sounds normal. No respiratory distress. Talking in complete sentences with ease.  Abdominal: Soft. +BS, soft, NT, ND, no rebound or guarding.  Musculoskeletal: TTP superficially over R greater trochanter. TTP over R paraspinal muscles. Pain with R straight leg raise. Can walk without assistance but slowly and favoring left leg.  Neurological: AOx3, no focal deficits. Skin: Skin is warm and dry. No  diaphoresis.  Psychiatric: Anxious affect.  Vitals reviewed  Assessment/Plan: HYPERTENSION, BENIGN SYSTEMIC - Patient back to baseline BP and without any focal neurologic deficits. Suspect pain contributed to elevated reading. Anxiety may have contributed, as well, with patient not taking her regular dose of ativan.  - Tramadol would be more likely to cause hypotension but recommended holding or decreasing dose back to 25-50 mg TID prn and taking either ibuprofen or naproxen instead (last day of steroids tomorrow) until MRI can further clarify cause of pain (may need an agent like gabapentin but takes a benzodiazepine fairly regularly)  Follow-up after MRI to discuss long-term care plan for back pain.  Olene Floss, MD [place], PGY-2

## 2016-11-02 NOTE — Telephone Encounter (Signed)
Spoke to pt. She has not taken BP. Denies chest pain, arm pain, jaw pain, no headache at this time. Pt said she continues to feel better. Rebecca Walter, CMA

## 2016-11-02 NOTE — Telephone Encounter (Signed)
Please follow up with patient. If she is still having similar symptoms and/or elevated BP I would highly recommend that she be seen in clinic today. Red flags of chest pain, jaw pain, arm pain, severe headache, and worsening SOB apply for patient to go to ED for evaluation.   Phill Myron, D.O. 11/02/2016, 8:23 AM PGY-2, [place]

## 2016-11-02 NOTE — Telephone Encounter (Signed)
Pt called to report her BP is 169/70 and she is feeling better

## 2016-11-02 NOTE — Telephone Encounter (Signed)
Pt informed and MRI scheduled. See additional notes on 11/01/2016. Ottis Stain, CMA

## 2016-11-02 NOTE — Telephone Encounter (Addendum)
Spoke to pt. Pt states she is feeling better. Denies chest pain, jaw pain, arm pain, severe headache. Breathing is better. Hasn't taken any more tramadol. Told pt to call 911 or go to ED if she has chest pain, jaw pain, severe headache. I asked her if she has taken her BP today, she has not but will call us after she does with the numbers. I told her I would call her again this afternoon to follow up. Ottis Stain, CMA

## 2016-11-02 NOTE — Telephone Encounter (Signed)
Routing to PCP as an FYI. Zimmerman Rumple, April D, CMA  

## 2016-11-03 ENCOUNTER — Other Ambulatory Visit: Payer: Self-pay | Admitting: *Deleted

## 2016-11-03 MED ORDER — TORSEMIDE 20 MG PO TABS: 20.0000 mg | ORAL_TABLET | Freq: Every day | ORAL | 5 refills | Status: DC

## 2016-11-04 NOTE — Assessment & Plan Note (Signed)
-   Patient back to baseline BP and without any focal neurologic deficits. Suspect pain contributed to elevated reading. Anxiety may have contributed, as well, with patient not taking her regular dose of ativan.  - Tramadol would be more likely to cause hypotension but recommended holding or decreasing dose back to 25-50 mg TID prn and taking either ibuprofen or naproxen instead (last day of steroids tomorrow) until MRI can further clarify cause of pain (may need an agent like gabapentin but takes a benzodiazepine fairly regularly)

## 2016-11-05 ENCOUNTER — Telehealth: Payer: Self-pay | Admitting: Obstetrics and Gynecology

## 2016-11-05 NOTE — Telephone Encounter (Signed)
Family Medicine Emergency Line Telephone Note   Patient calls after hour line stating that she has elevated BP. Was seen in clinic on 11/13 for hypertension that was thought to be due to pain. Patient has chronic pain that she takes tramadol for. BP on check was 198/78 and pulse 86. Patient states she checked her BP cause she just wasn't feeling right. She also checked her blood sugar which was 141 (h/o diabetes). She took her afternoon BP medications and rechecked BP which was 99991111 systolic.  Discussed with patient that yes pain can be a big contributor to elevated pressures. She had also been taking naproxen Discussed with patient to hold the naproxen in setting of elevated pressures as this can sometimes contribute to elevation. She will likely need to follow-up in clinic for better BP control if remains elevated.   Encouraged patient to try and find ways to minimize pain and then recheck her BP this evening after medication has been in her system a little longer. Discussed that if she still felt unwell to come into the ED for further evaluation.   Patient voiced understanding and agreed to plan.   Luiz Blare, DO 11/05/2016, 5:27 PM PGY-3, [place]

## 2016-11-06 ENCOUNTER — Emergency Department (HOSPITAL_COMMUNITY)
Admission: EM | Admit: 2016-11-06 | Discharge: 2016-11-06 | Disposition: A | Discharge status: Home / Self Care | Payer: Medicare Other | Attending: Emergency Medicine | Admitting: Emergency Medicine

## 2016-11-06 ENCOUNTER — Encounter (HOSPITAL_COMMUNITY): Payer: Self-pay | Admitting: Emergency Medicine

## 2016-11-06 ENCOUNTER — Other Ambulatory Visit: Payer: Self-pay

## 2016-11-06 DIAGNOSIS — M5442 Lumbago with sciatica, left side: Secondary | ICD-10-CM | POA: Diagnosis not present

## 2016-11-06 DIAGNOSIS — I110 Hypertensive heart disease with heart failure: Secondary | ICD-10-CM | POA: Insufficient documentation

## 2016-11-06 DIAGNOSIS — Z7982 Long term (current) use of aspirin: Secondary | ICD-10-CM | POA: Insufficient documentation

## 2016-11-06 DIAGNOSIS — Z794 Long term (current) use of insulin: Secondary | ICD-10-CM | POA: Insufficient documentation

## 2016-11-06 DIAGNOSIS — M5441 Lumbago with sciatica, right side: Secondary | ICD-10-CM | POA: Diagnosis not present

## 2016-11-06 DIAGNOSIS — M25551 Pain in right hip: Secondary | ICD-10-CM | POA: Diagnosis present

## 2016-11-06 DIAGNOSIS — M5431 Sciatica, right side: Secondary | ICD-10-CM

## 2016-11-06 DIAGNOSIS — I509 Heart failure, unspecified: Secondary | ICD-10-CM | POA: Insufficient documentation

## 2016-11-06 DIAGNOSIS — E119 Type 2 diabetes mellitus without complications: Secondary | ICD-10-CM | POA: Diagnosis not present

## 2016-11-06 LAB — CBC WITH DIFFERENTIAL/PLATELET
BASOS PCT: 0 %
Basophils Absolute: 0 10*3/uL (ref 0.0–0.1)
EOS ABS: 0.1 10*3/uL (ref 0.0–0.7)
Eosinophils Relative: 1 %
HCT: 42.9 % (ref 36.0–46.0)
HEMOGLOBIN: 14.4 g/dL (ref 12.0–15.0)
LYMPHS ABS: 2.3 10*3/uL (ref 0.7–4.0)
Lymphocytes Relative: 13 %
MCH: 30 pg (ref 26.0–34.0)
MCHC: 33.6 g/dL (ref 30.0–36.0)
MCV: 89.4 fL (ref 78.0–100.0)
Monocytes Absolute: 2.2 10*3/uL — ABNORMAL HIGH (ref 0.1–1.0)
Monocytes Relative: 13 %
NEUTROS PCT: 73 %
Neutro Abs: 12.5 10*3/uL — ABNORMAL HIGH (ref 1.7–7.7)
Platelets: 219 10*3/uL (ref 150–400)
RBC: 4.8 MIL/uL (ref 3.87–5.11)
RDW: 15.4 % (ref 11.5–15.5)
WBC: 17.1 10*3/uL — AB (ref 4.0–10.5)

## 2016-11-06 LAB — COMPREHENSIVE METABOLIC PANEL
ALBUMIN: 3.8 g/dL (ref 3.5–5.0)
ALK PHOS: 55 U/L (ref 38–126)
ALT: 18 U/L (ref 14–54)
AST: 20 U/L (ref 15–41)
Anion gap: 12 (ref 5–15)
BUN: 14 mg/dL (ref 6–20)
CHLORIDE: 103 mmol/L (ref 101–111)
CO2: 26 mmol/L (ref 22–32)
Calcium: 9.2 mg/dL (ref 8.9–10.3)
Creatinine, Ser: 0.84 mg/dL (ref 0.44–1.00)
GFR calc Af Amer: >60 mL/min (ref >60)
GFR calc non Af Amer: >60 mL/min (ref >60)
Glucose, Bld: 185 mg/dL — ABNORMAL HIGH (ref 65–99)
POTASSIUM: 3.9 mmol/L (ref 3.5–5.1)
SODIUM: 141 mmol/L (ref 135–145)
TOTAL PROTEIN: 7.2 g/dL (ref 6.5–8.1)
Total Bilirubin: 0.3 mg/dL (ref 0.3–1.2)

## 2016-11-06 LAB — URINALYSIS, ROUTINE W REFLEX MICROSCOPIC
BILIRUBIN URINE: NEGATIVE
HGB URINE DIPSTICK: NEGATIVE
Ketones, ur: NEGATIVE mg/dL
Leukocytes, UA: NEGATIVE
NITRITE: NEGATIVE
PH: 6 (ref 5.0–8.0)
Protein, ur: 30 mg/dL — AB
SPECIFIC GRAVITY, URINE: 1.03 (ref 1.005–1.030)

## 2016-11-06 LAB — URINE MICROSCOPIC-ADD ON

## 2016-11-06 MED ORDER — HYDROCODONE-ACETAMINOPHEN 5-325 MG PO TABS
1.0000 | ORAL_TABLET | Freq: Once | ORAL | Status: AC
  Administered 2016-11-06: 1 via ORAL
  Filled 2016-11-06: qty 1

## 2016-11-06 MED ORDER — METHOCARBAMOL 500 MG PO TABS: 500.0000 mg | ORAL_TABLET | Freq: Two times a day (BID) | ORAL | 0 refills | Status: DC

## 2016-11-06 MED ORDER — DIAZEPAM 2 MG PO TABS
2.0000 mg | ORAL_TABLET | Freq: Once | ORAL | Status: AC
  Administered 2016-11-06: 2 mg via ORAL
  Filled 2016-11-06: qty 1

## 2016-11-06 MED ORDER — DIAZEPAM 5 MG PO TABS: 5.0000 mg | ORAL_TABLET | Freq: Once | ORAL | Status: DC

## 2016-11-06 MED ORDER — HYDROCODONE-ACETAMINOPHEN 5-325 MG PO TABS: 1.0000 | ORAL_TABLET | Freq: Four times a day (QID) | ORAL | 0 refills | Status: DC | PRN

## 2016-11-06 NOTE — ED Notes (Signed)
Pt states SOB due to medications; pt states not new; EKG taken

## 2016-11-06 NOTE — Discharge Instructions (Signed)
We saw you in the ER for back pain. Our exam don't indicate any spinal cord involvement - and thus we feel comfortable sending you home. Please stop tramadol. Please take 1/2 pill of what is prescribed initially. See your doctor.

## 2016-11-06 NOTE — ED Notes (Signed)
Pt stable, ambulatory, states understanding of discharge instructions 

## 2016-11-06 NOTE — ED Provider Notes (Signed)
[facility] DEPT Provider Note   CSN: [id number] Arrival date & time: 11/06/16  0008   By signing my name below, I, Gean Quint, attest that this documentation has been prepared under the direction and in the presence of Varney Biles, MD. Electronically Signed: Gean Quint, ED Scribe. 11/06/16. 3:21 AM.   History   Chief Complaint Chief Complaint  Patient presents with  . Hypertension  . Hip Pain    The history is provided by the patient. No language interpreter was used.    HPI Comments: Rebecca Walter is a 75 y.o. female with a history of arthritis, DM, and CHF,, who presents to the Emergency Department complaining of moderate, intermittent, right hip pain that radiates from her lower back pain and down the RLE x 2 weeks. Pt reports associated elevated BP secondary to pain, and mild headache. She reports systolic BP over [id number]. Pt states pain worsens when walking, and going from seated to standing. She has been taking Naproxen and Tramadol with minimal relief.  Pt has a h/o chronic right hip pain; reports that she has an MRI scheduled for 11/10/16. Pt denies fever, numbness/tingling in her extremities, abdominal pain, lower extremity pain, dysuria, hematuria, urinary incontinence, acute leg swelling/ acute breathing issues, fecal incontinence. Pt reports frequent urination due to fluid pills.  Pt lives alone and ambulates with walker/cane.      Past Medical History:  Diagnosis Date  . Anxiety   . Arthritis   . Diabetes mellitus   . GERD (gastroesophageal reflux disease)   . Gout   . Hyperlipidemia   . Hypertension   . Osteopenia     Patient Active Problem List   Diagnosis Date Noted  . Lumbar back pain with radiculopathy affecting right lower extremity 10/29/2016  . Boil 08/05/2016  . Elevated BP 08/22/2015  . Low back strain 03/14/2015  . Difficulty urinating 02/06/2015  . Neck muscle spasm 06/18/2014  . Right shoulder pain 04/25/2014  . Allergic rhinitis  03/22/2011  . INSOMNIA 09/16/2009  . GASTROPARESIS 08/12/2009  . Anxiety 10/18/2008  . SYSTOLIC MURMUR 0000000  . DYSKINESIA, ESOPHAGUS 09/13/2007  . CARCINOMA, BASAL CELL 05/26/2007  . Diabetes mellitus, type II ([place]) 02/16/2007  . HYPERLIPIDEMIA 02/16/2007  . OBESITY, NOS 02/16/2007  . HYPERTENSION, BENIGN SYSTEMIC 02/16/2007  . GASTROESOPHAGEAL REFLUX, NO ESOPHAGITIS 02/16/2007  . OSTEOARTHRITIS, MULTI SITES 02/16/2007  . OSTEOPENIA 02/16/2007  . Gout 02/16/2007    Past Surgical History:  Procedure Laterality Date  . KIDNEY SURGERY    . KNEE SURGERY    . MENISECTOMY      OB History    No data available       Home Medications    Prior to Admission medications   Medication Sig Start Date End Date Taking? Authorizing Provider  allopurinol (ZYLOPRIM) 100 MG tablet TAKE 2 TABLET (200 MG TOTAL) BY MOUTH DAILY. 04/27/16  Yes Aquilla Hacker, MD  aspirin 81 MG chewable tablet Chew 1 tablet (81 mg total) by mouth daily. 03/22/11  Yes Cletus Gash, MD  baclofen (LIORESAL) 10 MG tablet TAKE 1 TABLET BY MOUTH 3 TIMES A DAY AS NEEDED FOR MUSCLE SPASMS 07/28/16  Yes Alyssa A Haney, MD  benazepril (LOTENSIN) 20 MG tablet Take 1-2 tablets (20-40 mg total) by mouth 2 (two) times daily. Takes 40mg  in AM and 20mg  in evening. Patient taking differently: Take 40 mg by mouth 2 (two) times daily.  08/10/16  Yes Alyssa A Haney, MD  empagliflozin (JARDIANCE) 25 MG TABS tablet  Take 25 mg by mouth daily. 08/05/16  Yes Alyssa A Haney, MD  insulin aspart (NOVOLOG FLEXPEN) 100 UNIT/ML FlexPen INJECT 35 UNITS INTO THE SKIN 2 TIMES DAILY BEFORE BREAKFAST AND DINNER 09/16/16  Yes Zenia Resides, MD  Insulin Glargine (LANTUS SOLOSTAR) 100 UNIT/ML Solostar Pen Inject 70 Units into the skin every morning. 09/08/16  Yes Nicolette Bang, DO  loratadine (CLARITIN) 10 MG tablet Take 1 tablet (10 mg total) by mouth daily. 03/22/11  Yes Cletus Gash, MD  LORazepam (ATIVAN) 1 MG tablet Take 1  tablet (1 mg total) by mouth 2 (two) times daily as needed for anxiety. 09/08/16  Yes Nicolette Bang, DO  lovastatin (MEVACOR) 40 MG tablet TAKE 2 TABLETS BY MOUTH EVERY DAY 10/20/16  Yes Nicolette Bang, DO  metFORMIN (GLUCOPHAGE-XR) 500 MG 24 hr tablet TAKE 1 TABLET BY MOUTH 2 TIMES A DAY BEFORE MEALS 10/05/16  Yes Nicolette Bang, DO  metoCLOPramide (REGLAN) 5 MG tablet Take 1 tablet (5 mg total) by mouth 3 (three) times daily before meals. 03/22/11  Yes Cletus Gash, MD  Multiple Vitamin (MULTIVITAMIN) tablet Take 1 tablet by mouth daily.     Yes Historical Provider, MD  naproxen (NAPROSYN) 500 MG tablet Take 1 tablet (500 mg total) by mouth 2 (two) times daily with a meal. 11/01/16  Yes Rogue Bussing, MD  pantoprazole (PROTONIX) 40 MG tablet TAKE 1 TABLET (40 MG TOTAL) BY MOUTH DAILY. Patient taking differently: TAKE 1 TABLET (40 MG TOTAL) BY MOUTH TWICE DAILY 08/20/13  Yes Leone Haven, MD  Probiotic Product (PROBIOTIC PEARLS) CAPS Take 1 capsule by mouth once.    Yes Historical Provider, MD  torsemide (DEMADEX) 20 MG tablet Take 1 tablet (20 mg total) by mouth daily. Reported on 04/15/2016 11/03/16  Yes Nicolette Bang, DO  traMADol (ULTRAM) 50 MG tablet Take 1-2 tablets (50-100 mg total) by mouth every 8 (eight) hours as needed. 10/29/16  Yes Nicolette Bang, DO  vitamin E (NATURAL MIXED TOCOPHEROLS) 400 UNIT capsule Take 400 Units by mouth daily.    Yes Historical Provider, MD  ACCU-CHEK AVIVA PLUS test strip CHECK BLOOD SUGAR FIRST  THING IN THE MORNING BEFORE EATING, AFTER LUNCH, AND  AFTER DINNER TOTAL 3 TIMES  DAILY 10/25/16   Nicolette Bang, DO  HYDROcodone-acetaminophen (NORCO/VICODIN) 5-325 MG tablet Take 1 tablet by mouth every 6 (six) hours as needed. 11/06/16   Varney Biles, MD  Insulin Pen Needle (B-D ULTRAFINE III SHORT PEN) 31G X 8 MM MISC USE AS DIRECTED 06/15/16   Veatrice Bourbon, MD  Lancets (FREESTYLE)  lancets Use as directed    Historical Provider, MD  methocarbamol (ROBAXIN) 500 MG tablet Take 1 tablet (500 mg total) by mouth 2 (two) times daily. 11/06/16   Varney Biles, MD  predniSONE (DELTASONE) 50 MG tablet Take one tablet with breakfast daily for 5 days. Patient not taking: Reported on 11/06/2016 10/27/16   Nicolette Bang, DO    Family History Family History  Problem Relation Age of Onset  . Stroke Mother   . Heart disease Father   . Stroke Father   . Hypertension Father   . Cancer Sister   . Cancer Brother   . Heart disease Brother   . Diabetes Maternal Uncle     Social History Social History  Substance Use Topics  . Smoking status: Never Smoker  . Smokeless tobacco: Never Used  . Alcohol use No     Allergies  Hctz [hydrochlorothiazide]; Acyclovir and related; and Beta adrenergic blockers   Review of Systems Review of Systems 10 systems reviewed and all are negative for acute change except as noted in the HPI.  Physical Exam Updated Vital Signs BP 146/64   Pulse 77   Temp 99.6 F (37.6 C) (Oral)   Resp 23   Ht 5' (1.524 m)   Wt 205 lb (93 kg)   SpO2 93%   BMI 40.04 kg/m   Physical Exam  Constitutional: She appears well-developed and well-nourished. No distress.  HENT:  Head: Normocephalic and atraumatic.  Eyes: Conjunctivae are normal.  Neck: Neck supple. No JVD present.  Cardiovascular: Normal rate and regular rhythm.   Murmur (systolic) heard. Pulmonary/Chest: Effort normal and breath sounds normal. No respiratory distress.  Abdominal: Soft. There is no tenderness.  Musculoskeletal: She exhibits edema.  Trace pitting edema Pulses are equal No unilateral calf swelling or tenderness Lower lumbar spine and right hip TTP Negative passive and active straight leg raise bilaterally  Neurological: She is alert.  Skin: Skin is warm and dry.  Psychiatric: She has a normal mood and affect.  Nursing note and vitals reviewed.  BP at  bedside 161/69  ED Treatments / Results  DIAGNOSTIC STUDIES: Oxygen Saturation is 95% on RA, adequate by my interpretation.    COORDINATION OF CARE: 2:10 AM Discussed treatment plan with pt at bedside and pt agreed to plan.  Labs (all labs ordered are listed, but only abnormal results are displayed) Labs Reviewed  CBC WITH DIFFERENTIAL/PLATELET - Abnormal; Notable for the following:       Result Value   WBC 17.1 (*)    Neutro Abs 12.5 (*)    Monocytes Absolute 2.2 (*)    All other components within normal limits  COMPREHENSIVE METABOLIC PANEL - Abnormal; Notable for the following:    Glucose, Bld 185 (*)    All other components within normal limits  URINALYSIS, ROUTINE W REFLEX MICROSCOPIC (NOT AT [facility]) - Abnormal; Notable for the following:    Glucose, UA >1000 (*)    Protein, ur 30 (*)    All other components within normal limits  URINE MICROSCOPIC-ADD ON - Abnormal; Notable for the following:    Squamous Epithelial / LPF 0-5 (*)    Bacteria, UA RARE (*)    All other components within normal limits    EKG  EKG Interpretation None       Radiology No results found.  Procedures Procedures (including critical care time)  Medications Ordered in ED Medications  HYDROcodone-acetaminophen (NORCO/VICODIN) 5-325 MG per tablet 1 tablet (1 tablet Oral Given 11/06/16 0251)  diazepam (VALIUM) tablet 2 mg (2 mg Oral Given 11/06/16 0252)     Initial Impression / Assessment and Plan / ED Course  I have reviewed the triage vital signs and the nursing notes.  Pertinent labs & imaging results that were available during my care of the patient were reviewed by me and considered in my medical decision making (see chart for details).  Clinical Course    PT comes in with cc of back pain, that is radiating to the RLE. Pt has no red flags for cord compression. Her pain has radicular component, but there is no numbness, tingling.  DDX: DJD of the back with nerve  impingement Herniated disk Sciatica.  Of note, WC is elevated. On repeat exam - hip is completely fine. I was considering diskitis/epidural abscess etc in my ddx and added sed rate and crp, but realized  that pt is on prednisone and that is the cause for elevated WC. Pt able to ambulate. She was observed in the ER for an extended period of time and did well. Stable for d/c.    Final Clinical Impressions(s) / ED Diagnoses   Final diagnoses:  Sciatica of right side    New Prescriptions Discharge Medication List as of 11/06/2016  5:27 AM    START taking these medications   Details  HYDROcodone-acetaminophen (NORCO/VICODIN) 5-325 MG tablet Take 1 tablet by mouth every 6 (six) hours as needed., Starting Sat 11/06/2016, Print    methocarbamol (ROBAXIN) 500 MG tablet Take 1 tablet (500 mg total) by mouth 2 (two) times daily., Starting Sat 11/06/2016, Print       I personally performed the services described in this documentation, which was scribed in my presence. The recorded information has been reviewed and is accurate.     Varney Biles, MD 11/06/16 2245

## 2016-11-06 NOTE — ED Notes (Signed)
ED Provider at bedside. 

## 2016-11-06 NOTE — ED Triage Notes (Signed)
Pt. reports elevated blood pressure at home this evening 298/82 and chronic right hip pain for several weeks , denies injury or fall , she is scheduled for MRI next week .

## 2016-11-09 ENCOUNTER — Other Ambulatory Visit: Payer: Self-pay | Admitting: Internal Medicine

## 2016-11-09 NOTE — Telephone Encounter (Signed)
Pt needs a refill on hydrocodone and methocarbamol. They are helping the pain, but it is still severe. BP is still high, but not as high. Pt needs the refill on pain medication faxed in because she is unable to come in, pt also needs the refill so she is able to get to MRI tomorrow. Pt would like someone to call her ASAP. Please advise. Thanks! ep

## 2016-11-10 ENCOUNTER — Encounter: Payer: Self-pay | Admitting: Internal Medicine

## 2016-11-10 ENCOUNTER — Ambulatory Visit
Admission: RE | Admit: 2016-11-10 | Discharge: 2016-11-10 | Disposition: A | Discharge status: Home / Self Care | Payer: Medicare Other | Source: Ambulatory Visit | Attending: Family Medicine | Admitting: Family Medicine

## 2016-11-10 ENCOUNTER — Telehealth: Payer: Self-pay | Admitting: Internal Medicine

## 2016-11-10 ENCOUNTER — Other Ambulatory Visit: Payer: Self-pay | Admitting: Internal Medicine

## 2016-11-10 ENCOUNTER — Ambulatory Visit (INDEPENDENT_AMBULATORY_CARE_PROVIDER_SITE_OTHER): Payer: Medicare Other | Admitting: Internal Medicine

## 2016-11-10 DIAGNOSIS — I10 Essential (primary) hypertension: Secondary | ICD-10-CM | POA: Diagnosis not present

## 2016-11-10 DIAGNOSIS — F411 Generalized anxiety disorder: Secondary | ICD-10-CM | POA: Diagnosis not present

## 2016-11-10 DIAGNOSIS — M5417 Radiculopathy, lumbosacral region: Secondary | ICD-10-CM | POA: Diagnosis not present

## 2016-11-10 DIAGNOSIS — M48061 Spinal stenosis, lumbar region without neurogenic claudication: Secondary | ICD-10-CM | POA: Diagnosis not present

## 2016-11-10 DIAGNOSIS — M5441 Lumbago with sciatica, right side: Secondary | ICD-10-CM

## 2016-11-10 DIAGNOSIS — M5416 Radiculopathy, lumbar region: Secondary | ICD-10-CM

## 2016-11-10 MED ORDER — HYDROCODONE-ACETAMINOPHEN 5-325 MG PO TABS: 1.0000 | ORAL_TABLET | Freq: Four times a day (QID) | ORAL | 0 refills | Status: DC | PRN

## 2016-11-10 MED ORDER — AMLODIPINE BESYLATE 5 MG PO TABS: 5.0000 mg | ORAL_TABLET | Freq: Every day | ORAL | 3 refills | Status: DC

## 2016-11-10 MED ORDER — LORAZEPAM 1 MG PO TABS: 1.0000 mg | ORAL_TABLET | Freq: Two times a day (BID) | ORAL | 0 refills | Status: DC | PRN

## 2016-11-10 MED ORDER — BACLOFEN 10 MG PO TABS
ORAL_TABLET | ORAL | 2 refills | Status: DC
Start: 2016-11-10 — End: 2017-06-21

## 2016-11-10 NOTE — Assessment & Plan Note (Addendum)
Initial reading of 190/71 but improved greatly on repeat. No red flags on history. Suspect pain contributing to elevated reading. Elected to add Amlodipine given repeated recorded elevated readings at the past few visits at [facility] and ED. Patient not good candidate for HCTZ given history of gout and B-blocker listed as allergy. Patient to continue to monitor BP at home and follow up for nurse visit in the next week for BP recheck,

## 2016-11-10 NOTE — Assessment & Plan Note (Signed)
Suspect pain related to disc herniation or compression. Currently awaiting MRI results. If MRI positive, will refer for steroid injections. If MRI negative, will try greater trochanter injection as patient does have TTP over the right greater trochanter. Refill for short course of Norco given. Elected to prescribed Baclofen instead of Robaxin as it is not on the [employer].

## 2016-11-10 NOTE — Telephone Encounter (Signed)
Pt called. She says she has taken baclofen for muscle spasms in the past but it doesn't work.  She wants to stay on the roboxin that she was given in the ED this weekend.  She also says tramadol or naproxen doesn't relieve her pain.  She would like to talk to dr Juleen China.

## 2016-11-10 NOTE — Progress Notes (Signed)
   Subjective:    Rebecca Walter - 75 y.o. female MRN [id number]  Date of birth: 02/24/1941  HPI  Rebecca Walter is here for follow up.  Back Pain: With radicular symptoms. Was seen at clinic and MRI ordered. MRI performed today but read still pending. Went to ED for elevated BP. Was told likely due to pain. Given Rx for Norco (she has taken eight 5 mg tablets in 5 days) and Robaxin. Reports these medications have helped with pain. States that pain is still interfering greatly with daily activities. Spends most of her day sitting on a heating pad. No changes in bowel or bladder. No fevers. Has completed Rx for prednisone given at [facility] on 11/14.    HTN: Monitors BP at home and pressures have ranged from [id number] systolic in the past 1-2 weeks. Denies chest pain, SOB, changes in vision, or headaches.    -  reports that she has never smoked. She has never used smokeless tobacco. - Review of Systems: Per HPI. - Past Medical History: Patient Active Problem List   Diagnosis Date Noted  . Lumbar back pain with radiculopathy affecting right lower extremity 10/29/2016  . Boil 08/05/2016  . Elevated BP 08/22/2015  . Low back strain 03/14/2015  . Difficulty urinating 02/06/2015  . Neck muscle spasm 06/18/2014  . Right shoulder pain 04/25/2014  . Allergic rhinitis 03/22/2011  . INSOMNIA 09/16/2009  . GASTROPARESIS 08/12/2009  . Anxiety 10/18/2008  . SYSTOLIC MURMUR 0000000  . DYSKINESIA, ESOPHAGUS 09/13/2007  . CARCINOMA, BASAL CELL 05/26/2007  . Diabetes mellitus, type II ([place]) 02/16/2007  . HYPERLIPIDEMIA 02/16/2007  . OBESITY, NOS 02/16/2007  . HYPERTENSION, BENIGN SYSTEMIC 02/16/2007  . GASTROESOPHAGEAL REFLUX, NO ESOPHAGITIS 02/16/2007  . OSTEOARTHRITIS, MULTI SITES 02/16/2007  . OSTEOPENIA 02/16/2007  . Gout 02/16/2007   - Medications: reviewed and updated   Objective:   Physical Exam BP (!) 168/78   Pulse 85   Temp 98.6 F (37 C) (Oral)   Ht 5' (1.524 m)   Wt 199 lb (90.3  kg)   BMI 38.86 kg/m  Gen: NAD, alert, cooperative with exam, well-appearing MSK: TTP over right paraspinal muscles. TTP over right greater trochanter.  Neuro: Pain with R straight leg test. Walks with limp and assistance of cane.     Assessment & Plan:   Lumbar back pain with radiculopathy affecting right lower extremity Suspect pain related to disc herniation or compression. Currently awaiting MRI results. If MRI positive, will refer for steroid injections. If MRI negative, will try greater trochanter injection as patient does have TTP over the right greater trochanter. Refill for short course of Norco given. Elected to prescribed Baclofen instead of Robaxin as it is not on the [employer].   HYPERTENSION, BENIGN SYSTEMIC Initial reading of 190/71 but improved greatly on repeat. No red flags on history. Suspect pain contributing to elevated reading. Elected to add Amlodipine given repeated recorded elevated readings at the past few visits at [facility] and ED. Patient not good candidate for HCTZ given history of gout and B-blocker listed as allergy. Patient to continue to monitor BP at home and follow up for nurse visit in the next week for BP recheck,     Phill Myron, D.O. 11/10/2016, 3:48 PM PGY-2, [place]

## 2016-11-10 NOTE — Patient Instructions (Addendum)
Start taking Amlodipine 5 mg daily for your high blood pressure. Continue to monitor your blood pressure. Please make a follow up within the next two weeks for your blood pressure.   I have given you a prescription for your pain medication. I have also sent a muscle relaxer to your pharmacy.   I will call you with your MRI results.

## 2016-11-12 NOTE — Telephone Encounter (Signed)
I gave patient a prescription for Norco in person at her appointment on 11/22 due to the fact that Tramadol was not helping with her pain.   I discussed with patient that she could continue Robaxin at night (she had several pills left in her bottle) but that since it is on the list of medications not safe for the elderly I would not be refilling it. That's why I gave her Rx for Baclofen.   Phill Myron, D.O. 11/12/2016, 10:27 AM PGY-2, [place]

## 2016-11-15 ENCOUNTER — Telehealth: Payer: Self-pay | Admitting: Internal Medicine

## 2016-11-15 DIAGNOSIS — M48061 Spinal stenosis, lumbar region without neurogenic claudication: Secondary | ICD-10-CM

## 2016-11-15 DIAGNOSIS — R222 Localized swelling, mass and lump, trunk: Secondary | ICD-10-CM

## 2016-11-15 DIAGNOSIS — M489 Spondylopathy, unspecified: Secondary | ICD-10-CM

## 2016-11-15 MED ORDER — TIZANIDINE HCL 4 MG PO TABS: 4.0000 mg | ORAL_TABLET | Freq: Four times a day (QID) | ORAL | 0 refills | Status: DC | PRN

## 2016-11-15 NOTE — Telephone Encounter (Signed)
Called patient to discuss MRI results. Discussed that she has facet arthropathy, spinal stenosis, and disc protrusion with compression of nerves. Will place ortho referral for evaluation for epidural injections.   Also discussed that she had an incidental finding on T11 vertebrae of a 7mm mass. Have ordered an MRI of thoracic spine at radiologist's recommendation for further characterization.   Phill Myron, D.O. 11/15/2016, 11:31 AM PGY-2, [place]

## 2016-11-15 NOTE — Telephone Encounter (Signed)
Contacted pt and gave her the below information and she stated that the baclofen does not work and that the robaxin she has does and that she knows that you are not going to refill that but she needs something equivalent so she can rest at night.  She also inquired about her MRI results and also wanted to inform you that her BP continues to go up.  She said last night that her BP was 188/74 last night.  Told pt that I would send this information to pcp and that as soon as she gets back with [place] we will contact her and let her know what the doctor said. Katharina Caper, April D, [place]

## 2016-11-16 NOTE — Telephone Encounter (Signed)
Contacted pt to see about getting this MRI scheduled and she wanted to be sure that the ortho doctor has her information from her MRI.  I told her that we may could give her the information at her next visit to take with her or that a ROI may need to be filled out to have information sent to them.  Also contacted [facility] to schedule and they will contact pt to go over screening questions and then schedule this. Informed pt of this and she said they did call and told her they would need to contact us again have not heard back as of now.   Katharina Caper, Florestine Carmical D, [place]

## 2016-11-18 ENCOUNTER — Telehealth: Payer: Self-pay | Admitting: Internal Medicine

## 2016-11-18 NOTE — Telephone Encounter (Signed)
Pt called and needs a refill on her Vicodin either left up front for pick up or called in. She is here tomorrow to see the nurse and could pick this up then. Please let patient know either way. jw

## 2016-11-19 ENCOUNTER — Other Ambulatory Visit: Payer: Self-pay | Admitting: Internal Medicine

## 2016-11-19 ENCOUNTER — Other Ambulatory Visit: Payer: Self-pay | Admitting: *Deleted

## 2016-11-19 ENCOUNTER — Ambulatory Visit (INDEPENDENT_AMBULATORY_CARE_PROVIDER_SITE_OTHER): Payer: Medicare Other | Admitting: *Deleted

## 2016-11-19 VITALS — BP 150/68 | HR 87

## 2016-11-19 DIAGNOSIS — E118 Type 2 diabetes mellitus with unspecified complications: Secondary | ICD-10-CM

## 2016-11-19 DIAGNOSIS — I10 Essential (primary) hypertension: Secondary | ICD-10-CM

## 2016-11-19 DIAGNOSIS — Z0130 Encounter for examination of blood pressure without abnormal findings: Secondary | ICD-10-CM

## 2016-11-19 MED ORDER — INSULIN ASPART 100 UNIT/ML FLEXPEN: PEN_INJECTOR | SUBCUTANEOUS | 3 refills | Status: DC

## 2016-11-19 MED ORDER — HYDROCODONE-ACETAMINOPHEN 5-325 MG PO TABS: 1.0000 | ORAL_TABLET | Freq: Four times a day (QID) | ORAL | 0 refills | Status: DC | PRN

## 2016-11-19 NOTE — Telephone Encounter (Signed)
Rx for #20 Vicodin left up front.  Phill Myron, D.O. 11/19/2016, 9:59 AM PGY-2, [place]

## 2016-11-19 NOTE — Progress Notes (Signed)
   Patient in nurse clinic for blood pressure check. Patient denies chest pain, SOB, dizziness, weakness, visual changes or headache.  Patient reported taking blood pressure medications as prescribed.  Will forward readings to PCP.  Derl Barrow, RN  Today's Vitals   11/19/16 1357 11/19/16 1400  BP: (!) 160/70 (!) 150/68  Pulse: 87   SpO2: 95%   PainSc: 0-No pain

## 2016-11-25 ENCOUNTER — Encounter (INDEPENDENT_AMBULATORY_CARE_PROVIDER_SITE_OTHER): Payer: Self-pay | Admitting: Orthopedic Surgery

## 2016-11-25 ENCOUNTER — Ambulatory Visit (INDEPENDENT_AMBULATORY_CARE_PROVIDER_SITE_OTHER): Payer: Medicare Other | Admitting: Orthopedic Surgery

## 2016-11-25 DIAGNOSIS — M48062 Spinal stenosis, lumbar region with neurogenic claudication: Secondary | ICD-10-CM

## 2016-11-25 NOTE — Progress Notes (Signed)
Office Visit Note   Patient: Rebecca Walter           Date of Birth: August 27, 1941           MRN: [id number] Visit Date: 11/25/2016 Requested by: Nicolette Bang, DO [address] [place], [place] [address] PCP: Melina Schools, DO  Subjective: Chief Complaint  Patient presents with  . Right Hip - Pain  . Lower Back - Pain    HPI Rebecca Walter is a 75 year old female with right hip and low back pain.  MRI scan was done 11/10/2016 which is reviewed today.  It shows right superiorly migrating disc extrusion impinges on the L4-5 nerve roots.  She also has pretty significant high-grade spinal stenosis at that level.  She reports diminished walking endurance.  She is here with her son today.  Denies any weakness in the legs.  She is using a cane.  She states she can walk about 30-40 feet.  She is on a muscle relaxer which helps more than the Vicodin.  She does have insulin-dependent diabetes.              Review of Systems All systems reviewed are negative as they relate to the chief complaint within the history of present illness.  Patient denies  fevers or chills.    Assessment & Plan: Visit Diagnoses:  1. Spinal stenosis of lumbar region with neurogenic claudication     Plan: Impression is pretty significant spinal stenosis at one level LIV-5.  She has insulin-dependent diabetes so I don't think epidural steroid injections are such a great idea.  This looks like a surgical problem.  I would favor referral to one of our spine surgeons for further evaluation and management.  Follow-Up Instructions: No Follow-up on file.   Orders:  No orders of the defined types were placed in this encounter.  No orders of the defined types were placed in this encounter.     Procedures: No procedures performed   Clinical Data: No additional findings.  Objective: Vital Signs: There were no vitals taken for this visit.  Physical Exam   Constitutional: Patient appears  well-developed HEENT:  Head: Normocephalic Eyes:EOM are normal Neck: Normal range of motion Cardiovascular: Normal rate Pulmonary/chest: Effort normal Neurologic: Patient is alert Skin: Skin is warm Psychiatric: Patient has normal mood and affect    Ortho Exam on exam the patient has pretty normal gait and alignment.  Palpable pedal pulses.  5 out of 5 ankle dorsiflexion plantar flexion quite and hamstring strength.  Also has good hip flexor strength.  No definite paresthesias L1-S1 bilaterally.  Negative Babinski negative clonus bilaterally.  No groin pain with internal/external rotation of the right or left leg no real trochanteric tenderness is noted.  Specialty Comments:  No specialty comments available.  Imaging: No results found.   PMFS History: Patient Active Problem List   Diagnosis Date Noted  . Lumbar back pain with radiculopathy affecting right lower extremity 10/29/2016  . Boil 08/05/2016  . Elevated BP 08/22/2015  . Low back strain 03/14/2015  . Difficulty urinating 02/06/2015  . Neck muscle spasm 06/18/2014  . Right shoulder pain 04/25/2014  . Allergic rhinitis 03/22/2011  . INSOMNIA 09/16/2009  . GASTROPARESIS 08/12/2009  . Anxiety 10/18/2008  . SYSTOLIC MURMUR 0000000  . DYSKINESIA, ESOPHAGUS 09/13/2007  . CARCINOMA, BASAL CELL 05/26/2007  . Diabetes mellitus, type II ([place]) 02/16/2007  . HYPERLIPIDEMIA 02/16/2007  . OBESITY, NOS 02/16/2007  . HYPERTENSION, BENIGN SYSTEMIC 02/16/2007  .  GASTROESOPHAGEAL REFLUX, NO ESOPHAGITIS 02/16/2007  . OSTEOARTHRITIS, MULTI SITES 02/16/2007  . OSTEOPENIA 02/16/2007  . Gout 02/16/2007   Past Medical History:  Diagnosis Date  . Anxiety   . Arthritis   . Diabetes mellitus   . GERD (gastroesophageal reflux disease)   . Gout   . Hyperlipidemia   . Hypertension   . Osteopenia     Family History  Problem Relation Age of Onset  . Stroke Mother   . Heart disease Father   . Stroke Father   . Hypertension  Father   . Cancer Sister   . Cancer Brother   . Heart disease Brother   . Diabetes Maternal Uncle     Past Surgical History:  Procedure Laterality Date  . KIDNEY SURGERY    . KNEE SURGERY    . MENISECTOMY     Social History   Occupational History  . retired-office work    Social History Main Topics  . Smoking status: Never Smoker  . Smokeless tobacco: Never Used  . Alcohol use No  . Drug use: No  . Sexual activity: Not on file

## 2016-11-26 ENCOUNTER — Other Ambulatory Visit (HOSPITAL_COMMUNITY): Payer: Medicare Other

## 2016-11-28 ENCOUNTER — Inpatient Hospital Stay: Admission: RE | Admit: 2016-11-28 | Payer: Medicare Other | Source: Ambulatory Visit

## 2016-11-30 ENCOUNTER — Encounter (INDEPENDENT_AMBULATORY_CARE_PROVIDER_SITE_OTHER): Payer: Self-pay | Admitting: Orthopaedic Surgery

## 2016-11-30 ENCOUNTER — Other Ambulatory Visit: Payer: Self-pay | Admitting: *Deleted

## 2016-11-30 ENCOUNTER — Ambulatory Visit (INDEPENDENT_AMBULATORY_CARE_PROVIDER_SITE_OTHER): Payer: Medicare Other | Admitting: Orthopaedic Surgery

## 2016-11-30 VITALS — BP 162/78 | HR 96 | Ht 60.0 in | Wt 200.0 lb

## 2016-11-30 DIAGNOSIS — M48062 Spinal stenosis, lumbar region with neurogenic claudication: Secondary | ICD-10-CM | POA: Diagnosis not present

## 2016-11-30 NOTE — Patient Instructions (Signed)
Continue ambulation with a cane. Avoid slippery surfaces ice and snow returns see Dr. Lorin Mercy the to the discussed your spinal stenosis in one month

## 2016-11-30 NOTE — Progress Notes (Signed)
Office Visit Note   Patient: Rebecca Walter           Date of Birth: 06/26/1941           MRN: [id number] Visit Date: 11/30/2016              Requested by: Nicolette Bang, DO [address] [place], [place] [address] PCP: Melina Schools, DO   Assessment & Plan: Visit Diagnoses:  1. Spinal stenosis of lumbar region with neurogenic claudication     Plan: We reviewed with patient and her son is with her today who gave an additional history. MRI scan shows spinal stenosis with disc protrusion cephalad migration and multifactorial spinal stenosis which is rated at severe and interpreted as high-grade stenosis by the radiologist. She has a severe right subarticular recess stenosis. We had a long discussion about the surgical procedure with decompression. We looked at spine models discussed hospital stay risks of surgery including dural tear ,reoperation, and potential for recurrent stenosis, possibly of recurrent disc rupture were all discussed.  This is an elective procedure and she will come back after the holidays we can discuss it further.  Follow-Up Instructions: No Follow-up on file.   Orders:  No orders of the defined types were placed in this encounter.  No orders of the defined types were placed in this encounter.     Procedures: No procedures performed   Clinical Data: No additional findings.   Subjective: Chief Complaint  Patient presents with  . Right Hip - Pain  . Lower Back - Pain    Ms Condor was referred here by Dr. Marlou Sa for her back and her right hip. She states that she occasionally has back pain.  But that she has mostly right hip pain that feels like sciatic pain in the leg.  Patient has significantly increased pain with standing pain with ambulation and distance walking. She is ambulatory with a cane.  Review of Systems  Constitutional: Negative for chills and diaphoresis.  HENT: Negative for ear discharge, ear pain and nosebleeds.     Eyes: Negative for discharge and visual disturbance.  Respiratory: Negative for cough, choking and shortness of breath.   Cardiovascular: Negative for chest pain and palpitations.       Positive for hypertension and hyperlipidemia.  Gastrointestinal: Negative for abdominal distention and abdominal pain.  Endocrine: Negative for cold intolerance and heat intolerance.  Genitourinary: Negative for flank pain and hematuria.  Musculoskeletal:       Previous knee arthroscopy portals that are healed. Crepitus with knee range of motion right left.  Skin: Negative for rash and wound.  Neurological: Negative for seizures and speech difficulty.  Hematological: Negative for adenopathy. Does not bruise/bleed easily.  Psychiatric/Behavioral: Negative for agitation and suicidal ideas.   onset diabetes 1999, she's she's been on insulin since 2008 Patient denies any associated bowel or bladder symptoms no fever or chills.  Objective: Vital Signs: BP (!) 162/78 (BP Location: Left Arm, Patient Position: Sitting)   Pulse 96   Ht 5' (1.524 m)   Wt 200 lb (90.7 kg)   BMI 39.06 kg/m   Physical Exam  Constitutional: She is oriented to person, place, and time. She appears well-developed.  HENT:  Head: Normocephalic.  Right Ear: External ear normal.  Left Ear: External ear normal.  Eyes: Pupils are equal, round, and reactive to light.  Neck: No tracheal deviation present. No thyromegaly present.  Cardiovascular: Normal rate.   Pulmonary/Chest: Effort normal. She  has no wheezes. She has no rales.  Abdominal: Soft.  Musculoskeletal:  Patient has some edema bilateral lower extremities. Some pain with straight leg raising 90. Reflexes are intact no pain with hip range of motion. No plantar foot lesions.  Neurological: She is alert and oriented to person, place, and time.  Skin: Skin is warm and dry.  Psychiatric: She has a normal mood and affect. Her behavior is normal.    Ortho Exam  Specialty  Comments:  No specialty comments available.  Imaging: No results found.   PMFS History: Patient Active Problem List   Diagnosis Date Noted  . Lumbar back pain with radiculopathy affecting right lower extremity 10/29/2016  . Boil 08/05/2016  . Elevated BP 08/22/2015  . Low back strain 03/14/2015  . Difficulty urinating 02/06/2015  . Neck muscle spasm 06/18/2014  . Right shoulder pain 04/25/2014  . Allergic rhinitis 03/22/2011  . INSOMNIA 09/16/2009  . GASTROPARESIS 08/12/2009  . Anxiety 10/18/2008  . SYSTOLIC MURMUR 0000000  . DYSKINESIA, ESOPHAGUS 09/13/2007  . CARCINOMA, BASAL CELL 05/26/2007  . Diabetes mellitus, type II ([place]) 02/16/2007  . HYPERLIPIDEMIA 02/16/2007  . OBESITY, NOS 02/16/2007  . HYPERTENSION, BENIGN SYSTEMIC 02/16/2007  . GASTROESOPHAGEAL REFLUX, NO ESOPHAGITIS 02/16/2007  . OSTEOARTHRITIS, MULTI SITES 02/16/2007  . OSTEOPENIA 02/16/2007  . Gout 02/16/2007   Past Medical History:  Diagnosis Date  . Anxiety   . Arthritis   . Diabetes mellitus   . GERD (gastroesophageal reflux disease)   . Gout   . Hyperlipidemia   . Hypertension   . Osteopenia     Family History  Problem Relation Age of Onset  . Stroke Mother   . Heart disease Father   . Stroke Father   . Hypertension Father   . Cancer Sister   . Cancer Brother   . Heart disease Brother   . Diabetes Maternal Uncle     Past Surgical History:  Procedure Laterality Date  . KIDNEY SURGERY    . KNEE SURGERY    . MENISECTOMY     Social History   Occupational History  . retired-office work    Social History Main Topics  . Smoking status: Never Smoker  . Smokeless tobacco: Never Used  . Alcohol use No  . Drug use: No  . Sexual activity: Not on file

## 2016-11-30 NOTE — Telephone Encounter (Signed)
I will be in clinic tomorrow and will address this refill at that time. Per our policy, Ms. Egelhoff needs to give me 24-48 hours to respond to a narcotic refill request.  Phill Myron, D.O. 11/30/2016, 1:22 PM PGY-2, [place]

## 2016-11-30 NOTE — Telephone Encounter (Signed)
2nd request. Pt would like to pick this up today since she has a ride. Please advise. Thanks! ep

## 2016-12-01 MED ORDER — HYDROCODONE-ACETAMINOPHEN 5-325 MG PO TABS: 1.0000 | ORAL_TABLET | Freq: Four times a day (QID) | ORAL | 0 refills | Status: DC | PRN

## 2016-12-01 NOTE — Telephone Encounter (Signed)
Spoke to Ms Stinson she asked for a refill of her needles. She would like to pickup that Rx at the same time she picks up the [facility]. Ottis Stain, CMA

## 2016-12-01 NOTE — Telephone Encounter (Signed)
I have printed Rx and will leave it up front for patient pick up.   Phill Myron, D.O. 12/01/2016, 1:34 PM PGY-2, [place]

## 2016-12-02 ENCOUNTER — Other Ambulatory Visit: Payer: Self-pay | Admitting: Internal Medicine

## 2016-12-02 ENCOUNTER — Telehealth: Payer: Self-pay | Admitting: Internal Medicine

## 2016-12-02 NOTE — Telephone Encounter (Signed)
I have refilled the needles and sent them electronically.   Phill Myron, D.O. 12/02/2016, 12:00 PM PGY-2, [place]

## 2016-12-02 NOTE — Telephone Encounter (Signed)
Pt called and would like to get a pill for her yeast infection called in. She said that Dr. Valentina Lucks did this in the is past. jw

## 2016-12-02 NOTE — Telephone Encounter (Signed)
Pt informed. Sharon T Saunders, CMA  

## 2016-12-03 MED ORDER — FLUCONAZOLE 150 MG PO TABS: 150.0000 mg | ORAL_TABLET | Freq: Once | ORAL | 0 refills | Status: AC

## 2016-12-03 NOTE — Telephone Encounter (Signed)
Pt informed. Rolf Fells T Daissy Yerian, CMA  

## 2016-12-03 NOTE — Telephone Encounter (Signed)
Rx for Diflucan sent in. Patient should take second tablet if she is still symptomatic in 72 hours. If her symptoms do not resolve after treatment she will need an appointment.   Phill Myron, D.O. 12/03/2016, 11:35 AM PGY-2, [place]

## 2016-12-24 ENCOUNTER — Other Ambulatory Visit: Payer: Self-pay | Admitting: Internal Medicine

## 2016-12-24 DIAGNOSIS — F411 Generalized anxiety disorder: Secondary | ICD-10-CM

## 2016-12-24 MED ORDER — LORAZEPAM 1 MG PO TABS: 1.0000 mg | ORAL_TABLET | Freq: Two times a day (BID) | ORAL | 0 refills | Status: DC | PRN

## 2016-12-24 NOTE — Telephone Encounter (Signed)
Called in Rx for Ativan refill.   Phill Myron, D.O. 12/24/2016, 5:20 PM PGY-2, [place]

## 2016-12-24 NOTE — Telephone Encounter (Signed)
Pt needs a refill on lorazepam. Pt would like it called into [facility] on [address]. Pt states given the weather she is unable to come in for an appointment at this time. Pt only has 3 pills left. ep

## 2016-12-31 ENCOUNTER — Ambulatory Visit (INDEPENDENT_AMBULATORY_CARE_PROVIDER_SITE_OTHER): Payer: Self-pay | Admitting: Orthopaedic Surgery

## 2017-01-11 DIAGNOSIS — R0683 Snoring: Secondary | ICD-10-CM | POA: Diagnosis not present

## 2017-01-11 DIAGNOSIS — D3131 Benign neoplasm of right choroid: Secondary | ICD-10-CM | POA: Diagnosis not present

## 2017-01-11 DIAGNOSIS — E119 Type 2 diabetes mellitus without complications: Secondary | ICD-10-CM | POA: Diagnosis not present

## 2017-01-11 DIAGNOSIS — H33102 Unspecified retinoschisis, left eye: Secondary | ICD-10-CM | POA: Diagnosis not present

## 2017-01-14 ENCOUNTER — Ambulatory Visit (INDEPENDENT_AMBULATORY_CARE_PROVIDER_SITE_OTHER): Payer: Medicare Other | Admitting: Internal Medicine

## 2017-01-14 ENCOUNTER — Encounter: Payer: Self-pay | Admitting: Internal Medicine

## 2017-01-14 VITALS — BP 154/72 | HR 86 | Temp 98.3°F | Ht 60.0 in | Wt 198.8 lb

## 2017-01-14 DIAGNOSIS — R300 Dysuria: Secondary | ICD-10-CM

## 2017-01-14 DIAGNOSIS — M549 Dorsalgia, unspecified: Secondary | ICD-10-CM | POA: Diagnosis not present

## 2017-01-14 LAB — POCT UA - MICROSCOPIC ONLY

## 2017-01-14 LAB — POCT URINALYSIS DIPSTICK
Bilirubin, UA: NEGATIVE
Ketones, UA: NEGATIVE
LEUKOCYTES UA: NEGATIVE
Nitrite, UA: NEGATIVE
Protein, UA: 30
SPEC GRAV UA: 1.01
Urobilinogen, UA: 0.2
pH, UA: 5.5

## 2017-01-14 MED ORDER — HYOSCYAMINE SULFATE 0.125 MG PO TABS: 0.1250 mg | ORAL_TABLET | ORAL | 0 refills | Status: DC | PRN

## 2017-01-14 NOTE — Patient Instructions (Addendum)
Try Levsin for your symptoms. Please make a follow up visit with your GI doctor as soon as possible.

## 2017-01-14 NOTE — Progress Notes (Signed)
   [facility] Phone: [phone]   Date of Visit: 01/14/2017   HPI:  Rebecca Walter is a 76 y.o. female presenting to clinic today for same day appointment. PCP: Melina Schools, DO Concerns today include:  - reports of pain between her shoulder blades for 1 week.  - initially reports this started as intermittent mainly after she eats but over the past few days is more constant. She currently does not have pain. This hasn't happended in year  - aggravated by sitting, relieved when she lays back. "Sometimes" worse with activity.  - describes as a somewhat sharp/pressure pain that remains between her shoulders.  - no associated symptoms such as diaphoresis or nausea. Reports she "has been feeling short winded recently" - reports she had the same symptoms "years ago" which was due to her GERD. She reports she has been using Protonix and Reglan as prescribed and this usually resolves the symptoms.  - denies having dysphagia or sensation of food getting stuck in her esophagus.  - no chest pain  - reports she recently had a yeast infection and her PCP sent in a Rx for this. Reports she gets this somewhat frequently as she is on Jardiance. She denies vaginal discharge today or dysuria but would like to do a UA (already done before provider discussed with patient)   ROS: See HPI.  [place]:  PMH:  HTN, HLD  Allergic Rhinitis  Gastroparesis/GERD  DM2  OA  Osteopenia  Obesity  Anxiety   PHYSICAL EXAM: BP (!) 154/72   Pulse 86   Temp 98.3 F (36.8 C) (Oral)   Ht 5' (1.524 m)   Wt 198 lb 12.8 oz (90.2 kg)   SpO2 94%   BMI 38.83 kg/m  GEN: NAD CV: RRR, no murmurs, rubs, or gallops PULM: CTAB, normal effort ABD: Soft, nontender, nondistended, NABS, no organomegaly MSK: no pain of palpation of upper back or with movement of shoulders.  SKIN: No  cyanosis; warm and well-perfused EXTR: No lower extremity edema or calf tenderness PSYCH: Mood and affect  euthymic, normal rate and volume of speech NEURO: Awake, alert, no focal deficits grossly, normal speech   ASSESSMENT/PLAN:  1. Upper back pain Likely GI in etiology possibly esophageal spasm vs uncontrolled GERD. Unlikely cardiac in etiology. Trial of Levsin PRN. Recommended follow up with GI physician. Discussed with preceptor.  2. Dysuria UA negative for infection  - Urinalysis Dipstick - POCT UA - Microscopic Only    Smiley Houseman, MD PGY [place]

## 2017-01-19 DIAGNOSIS — R1310 Dysphagia, unspecified: Secondary | ICD-10-CM | POA: Diagnosis not present

## 2017-01-24 ENCOUNTER — Telehealth: Payer: Self-pay | Admitting: Internal Medicine

## 2017-01-24 NOTE — Telephone Encounter (Signed)
Discussed frequency of yeast infections and decided it may be appropriate to consider alternatives including GLP agents Trulicity or Victoza.  Patient was uninterested in more shots in the past however is willing to reconsider if these will avoid having yeast infections.   She plans to visit her PCP Dr. Juleen China tomorrow PM.  I shared that I would be willing to talk more with her about these options at that time.  She was thankful for the call and she will plan to discuss more tomorrow PM  01/25/2017.

## 2017-01-24 NOTE — Telephone Encounter (Signed)
Pt is calling because she is taking Jardiance. The medication does work but one of the side effects is that she always has a yeast infection and the is bothersome. She has an appointment 01/25/17 with Dr. Juleen China and wants to see what if there is something else she can take or should she just get a prescription for yeast infections. Please call patient to discuss. jw

## 2017-01-25 ENCOUNTER — Ambulatory Visit (INDEPENDENT_AMBULATORY_CARE_PROVIDER_SITE_OTHER): Payer: Medicare Other | Admitting: Internal Medicine

## 2017-01-25 ENCOUNTER — Encounter: Payer: Self-pay | Admitting: Internal Medicine

## 2017-01-25 ENCOUNTER — Other Ambulatory Visit: Payer: Self-pay | Admitting: Internal Medicine

## 2017-01-25 VITALS — BP 138/66 | HR 82 | Temp 98.4°F | Ht 60.0 in | Wt 201.0 lb

## 2017-01-25 DIAGNOSIS — F411 Generalized anxiety disorder: Secondary | ICD-10-CM

## 2017-01-25 DIAGNOSIS — B373 Candidiasis of vulva and vagina: Secondary | ICD-10-CM

## 2017-01-25 DIAGNOSIS — E118 Type 2 diabetes mellitus with unspecified complications: Secondary | ICD-10-CM

## 2017-01-25 DIAGNOSIS — B3731 Acute candidiasis of vulva and vagina: Secondary | ICD-10-CM | POA: Insufficient documentation

## 2017-01-25 LAB — POCT GLYCOSYLATED HEMOGLOBIN (HGB A1C): HEMOGLOBIN A1C: 7.2

## 2017-01-25 MED ORDER — LIRAGLUTIDE 18 MG/3ML ~~LOC~~ SOPN: 0.6000 mg | PEN_INJECTOR | Freq: Every day | SUBCUTANEOUS | 1 refills | Status: DC

## 2017-01-25 MED ORDER — INSULIN GLARGINE 100 UNIT/ML SOLOSTAR PEN
70.0000 [IU] | PEN_INJECTOR | SUBCUTANEOUS | 2 refills | Status: DC
Start: 2017-01-25 — End: 2017-03-29

## 2017-01-25 MED ORDER — LORAZEPAM 1 MG PO TABS: 1.0000 mg | ORAL_TABLET | Freq: Two times a day (BID) | ORAL | 2 refills | Status: DC | PRN

## 2017-01-25 MED ORDER — FLUCONAZOLE 150 MG PO TABS: 150.0000 mg | ORAL_TABLET | Freq: Once | ORAL | 0 refills | Status: AC

## 2017-01-25 NOTE — Assessment & Plan Note (Signed)
Improvement in A1C from 7.6 to 7.2. Has had increased frequency of yeast infections with Jardiance. Will switch to Victoza. Pharmacy team demonstrated how to use injectable medication. Discussed increasing dose from 0.6 mg to 1.2 mg after one week of therapy. Continue with Metformin, Lantus and Novolog. Patient to return in 1 month with CBG log.

## 2017-01-25 NOTE — Assessment & Plan Note (Signed)
Prescribed Diflucan. If patient continues to have symptoms despite treatment and d/c of Jardiance she is to follow up.

## 2017-01-25 NOTE — Progress Notes (Signed)
   Subjective:    Rebecca Walter - 76 y.o. female MRN [id number]  Date of birth: [birth year]  HPI  Danila C Ritzel is here for follow up for T2DM.  T2DM: Patient currently taking Lantus 70 units daily in the morning and Novolog 35u BID. She has been taking Jardiance for several months but reports an increase in yeast infections. Is currently symptomatic. Was previously not willing to use another injectable medication but wants to consider given frequency of yeast infections. CBGs have been 140-160 fasting. Evening CBGs postprandial 150-180. Denies hypoglycemia. Denies nausea, vomiting, and polyuria. Gets up to urinate once per night.    -  reports that she has never smoked. She has never used smokeless tobacco. - Review of Systems: Per HPI. - Past Medical History: Patient Active Problem List   Diagnosis Date Noted  . Yeast vaginitis 01/25/2017  . Lumbar back pain with radiculopathy affecting right lower extremity 10/29/2016  . Boil 08/05/2016  . Elevated BP 08/22/2015  . Low back strain 03/14/2015  . Difficulty urinating 02/06/2015  . Neck muscle spasm 06/18/2014  . Right shoulder pain 04/25/2014  . Allergic rhinitis 03/22/2011  . INSOMNIA 09/16/2009  . GASTROPARESIS 08/12/2009  . Anxiety 10/18/2008  . SYSTOLIC MURMUR 0000000  . DYSKINESIA, ESOPHAGUS 09/13/2007  . CARCINOMA, BASAL CELL 05/26/2007  . Diabetes mellitus, type II ([place]) 02/16/2007  . HYPERLIPIDEMIA 02/16/2007  . OBESITY, NOS 02/16/2007  . HYPERTENSION, BENIGN SYSTEMIC 02/16/2007  . GASTROESOPHAGEAL REFLUX, NO ESOPHAGITIS 02/16/2007  . OSTEOARTHRITIS, MULTI SITES 02/16/2007  . OSTEOPENIA 02/16/2007  . Gout 02/16/2007   - Medications: reviewed and updated   Objective:   Physical Exam BP 138/66   Pulse 82   Temp 98.4 F (36.9 C) (Oral)   Ht 5' (1.524 m)   Wt 201 lb (91.2 kg)   SpO2 95%   BMI 39.26 kg/m  Gen: NAD, alert, cooperative with exam, well-appearing CV: RRR, good S1/S2, no murmur, no edema,  capillary refill brisk  Resp: CTABL, no wheezes, non-labored    Assessment & Plan:   Diabetes mellitus, type II Improvement in A1C from 7.6 to 7.2. Has had increased frequency of yeast infections with Jardiance. Will switch to Victoza. Pharmacy team demonstrated how to use injectable medication. Discussed increasing dose from 0.6 mg to 1.2 mg after one week of therapy. Continue with Metformin, Lantus and Novolog. Patient to return in 1 month with CBG log.   Yeast vaginitis Prescribed Diflucan. If patient continues to have symptoms despite treatment and d/c of Jardiance she is to follow up.     Phill Myron, D.O. 01/25/2017, 5:06 PM PGY-2, [place]

## 2017-01-25 NOTE — Patient Instructions (Addendum)
For the Victoza take: 0.6 mg once daily for 1 week; then increase to 1.2 mg once daily. Stop the Jardiance.   Take the Diflucan 3 days apart for the yeast infection. If you continue to have problems despite treatment and discontinuation of the Jardiance please return.    Keep checking your blood sugars. Please return in 1 month with your blood sugar log.    Good to see you today!   Dr. Juleen China

## 2017-01-31 ENCOUNTER — Other Ambulatory Visit: Payer: Self-pay | Admitting: *Deleted

## 2017-01-31 DIAGNOSIS — E118 Type 2 diabetes mellitus with unspecified complications: Secondary | ICD-10-CM

## 2017-02-01 ENCOUNTER — Other Ambulatory Visit: Payer: Self-pay | Admitting: Internal Medicine

## 2017-02-22 ENCOUNTER — Telehealth: Payer: Self-pay | Admitting: Internal Medicine

## 2017-02-22 NOTE — Telephone Encounter (Signed)
Her sugars arent staying down.  This morning it was 239 before she ate. She can get it down to around 150 during the day but it will go up overnight. She also has sore bumps on her tongue. It burns when she drinks soda and eats certain foods. Please advise Pt says Dr Valentina Lucks has always dealt with her about her sugars.

## 2017-02-22 NOTE — Telephone Encounter (Signed)
I would recommend that she make an appointment with Dr. Valentina Lucks or me to discuss her blood sugar problems. Her numbers do not sound critically high at this point. As long as she is able to continue to hydrate herself and is not having nausea/vomiting she should be safe to follow at the clinic. In the meantime, if her blood sugar gets above 350 she should let us know.   Phill Myron, D.O. 02/22/2017, 2:59 PM PGY-2, [place]

## 2017-02-24 NOTE — Telephone Encounter (Signed)
Follow-Up RE high blood sugars.  Reports burning tongue for the last several days.  She is concerned that her blood sugar has been elevated over the last several days.   PM blood sugar was 153 yesterday and 174 today at 4:30.   She is concerned that her victoza is causing the burning mouth.  She is no longer taking the victoza.   Plan will be for her to call in the AM and try to be seen as a work-in.    CBG elevation may be stress related.  She likely needs a higher dose of her meal-time insulin.  I would be happy to see he to assist if necessary in follow-up.

## 2017-02-24 NOTE — Telephone Encounter (Signed)
Pt called again and I read her the message from Dr. Juleen China. Pt wants Dr. Valentina Lucks to call her today and she said she would discuss it with him. I will route message to team and Dr. Valentina Lucks. ep

## 2017-02-25 ENCOUNTER — Encounter: Payer: Self-pay | Admitting: Family Medicine

## 2017-02-25 ENCOUNTER — Ambulatory Visit (INDEPENDENT_AMBULATORY_CARE_PROVIDER_SITE_OTHER): Payer: Medicare Other | Admitting: Family Medicine

## 2017-02-25 VITALS — BP 150/62 | HR 93 | Temp 98.1°F | Ht 60.0 in | Wt 205.4 lb

## 2017-02-25 DIAGNOSIS — K140 Glossitis: Secondary | ICD-10-CM | POA: Diagnosis not present

## 2017-02-25 NOTE — Patient Instructions (Signed)
Glossitis Glossitis is inflammation of the tongue. This may be a stand-alone condition or it may be a symptom of a different condition that you have. Generally, glossitis goes away when its cause is identified and treated. Glossitis can be dangerous if it causes difficulty breathing. What are the causes? This condition can be caused by many different things. In some cases, the cause may not be known. Certain underlying conditions may cause glossitis, such as:  Viral, bacterial, or yeast infections.  Allergies.  Dysfunction of the skin and mucous membranes. This can happen with certain autoimmune disorders.  Abnormal tissue growths (tumors).  Lack of healthy red blood cells (anemia).  Movement of stomach acid into the tube that connects the mouth and the stomach (gastroesophageal reflux).  Lack of proper nutrition or certain vitamins.  Certain lifelong (chronic) medical conditions, such as diabetes. Sometimes, glossitis may not be caused by an underlying condition. In these cases, glossitis may be caused by:  Use of tobacco products, such as cigarettes, chewing tobacco, or e-cigarettes.  Excessive alcohol use.  Tongue injury or irritation.  Certain medicines, such as medicines to treat cancer. What increases the risk? This condition is more likely to develop in:  People who are 50 years or older.  Men.  People who are taking antibiotics or steroids, such as asthma medicines.  People who drink alcohol excessively.  People who use tobacco products, including cigarettes, chewing tobacco, or e-cigarettes.  People with chronic medical conditions, such as immune diseases or cancer.  People who do not brush or floss their teeth regularly.  People who lack proper nutrition or are anemic. What are the signs or symptoms? Symptoms of this condition vary depending on the cause. Symptoms may include:  Swelling of the tongue.  Pain and tenderness in the tongue. Sometimes, this  condition is painless.  Changes in tongue color. The tongue may be pale or bright red.  Smooth areas on the tongue's surface.  A small mass of tissue (node) or white patch on the tongue.  Difficulty chewing, swallowing, or talking.  Difficulty breathing. How is this diagnosed? This condition is diagnosed based on a physical exam and medical history. Your health care provider may ask you about your eating and drinking habits. You may have tests, including:  Blood tests.  Removal of a small amount of cells from the tongue that are examined under a microscope (biopsy). You may be given the name of a dentist or a health care provider who specializes in ear, nose, and throat (ENT) problems (otolaryngologist). How is this treated? Treatment for this condition depends on the underlying cause and may include:  Following instructions from your health care provider about keeping your mouth clean and avoiding irritants that may have caused your condition or made it worse.  Nutritional therapy. Your health care provider may tell you to change your eating and drinking habits or take a nutritional supplement.  Managing underlying conditions that may have caused your glossitis.  Medicines, such as:  Corticosteroids to reduce inflammation.  Antibiotics if your condition was caused by an infection.  Local anestheticsthat numb your tongue or mouth (local anesthetics). Follow these instructions at home:  Keep your teeth and mouth clean. This includes brushing and flossing frequently and having regular dental checkups.  If you wear dentures or dental braces, work with your dentist to make sure they fit correctly.  Eat healthy foods. Follow instructions from your health care provider about eating or drinking restrictions.  Avoid tobacco products, including  cigarettes, chewing tobacco, or e-cigarettes. If you need help quitting, ask your health care provider.  Avoid excessive alcohol  use.  Avoid any irritants that may have caused your condition or made it worse, such as chemicals or certain foods.  Keep all follow-up visits as told by your health care provider. This is important. Contact a health care provider if:  You have a fever.  You develop new symptoms.  You have symptoms that do not get better with medicine or get worse.  You have symptoms that do not go away after 10 days.  You cannot eat or drink because of your pain. Get help right away if:  You have severe pain or swelling.  You have difficulty breathing, swallowing, or talking. This information is not intended to replace advice given to you by your health care provider. Make sure you discuss any questions you have with your health care provider. Document Released: 11/26/2002 Document Revised: 05/13/2016 Document Reviewed: 04/23/2015 Elsevier Interactive Patient Education  2017 [employer].

## 2017-02-25 NOTE — Telephone Encounter (Signed)
Pt came in for an appointment for this today. Katharina Caper, April D, [place]

## 2017-03-02 NOTE — Progress Notes (Signed)
Date of Visit: 02/25/2017   HPI:  Patient presents for a same day appointment to discuss burning tongue. Patient presented describes symptoms of tongue burning for the past few days. Patient states symptoms are exacerbated by the type of food she eats and drinks. She describes her pain/burning as moderate to severe. Patient was recently started on Victoza for her DM2 and has reported very dry mouth for the past few days. Patient has stopped taking Victoza today (3/9). Patient denies any similar episode in the past.Patient has not taken any medications for her pain and talked to pharmacy who recommended clinic visit for further assessment. Patient denies fever, chills, difficulty swallowing or throat pain.  ROS: See HPI  [place]:  Past Medical History:  Diagnosis Date  . Anxiety   . Arthritis   . Diabetes mellitus   . GERD (gastroesophageal reflux disease)   . Gout   . Hyperlipidemia   . Hypertension   . Osteopenia    Past Surgical History:  Procedure Laterality Date  . KIDNEY SURGERY    . KNEE SURGERY    . MENISECTOMY      PHYSICAL EXAM:  BP (!) 150/62   Pulse 93   Temp 98.1 F (36.7 C) (Oral)   Ht 5' (1.524 m)   Wt 205 lb 6.4 oz (93.2 kg)   BMI 40.11 kg/m         General: NAD, pleasant, able to participate in exam Cardiac: RRR, normal heart sounds, no murmurs. 2+ radial and PT pulses bilaterally HEENT: multiple deep rugae noted on exam, no exudate, erythema, abrasions, ulcers noted on the rest of the exam Respiratory: CTAB, normal effort, No wheezes, rales or rhonchi Abdomen: soft, nontender, nondistended, no hepatic or splenomegaly, +BS Extremities: no edema or cyanosis. WWP. Skin: warm and dry, no rashes noted Neuro: alert and oriented x4, no focal deficits Psych: Normal affect and mood  ASSESSMENT/PLAN:  1. Burning tongue, acute  Patient describes symptoms consistent with fissure tongue/glossitis. Patient burning tongue most likely secondary to dry mouth  after starting Victoza a few days ago. Deep rugae seen on exam consistent with diagnosis. Will manage conservatively with return if symptoms worsens --Patient ask to avoid soft drink, irritating foods (spicy.Marland Kitchen) --Tongue brushing to eliminate debris stuck in the tongue fissures --Will consider antiinflammatory if symptoms do not improve in the next few days   Marjie Skiff, MD [place]

## 2017-03-03 ENCOUNTER — Ambulatory Visit (INDEPENDENT_AMBULATORY_CARE_PROVIDER_SITE_OTHER): Payer: Medicare Other | Admitting: Pharmacist

## 2017-03-03 ENCOUNTER — Encounter: Payer: Self-pay | Admitting: Pharmacist

## 2017-03-03 DIAGNOSIS — E118 Type 2 diabetes mellitus with unspecified complications: Secondary | ICD-10-CM

## 2017-03-03 DIAGNOSIS — Z794 Long term (current) use of insulin: Secondary | ICD-10-CM | POA: Diagnosis not present

## 2017-03-03 MED ORDER — SITAGLIPTIN PHOSPHATE 50 MG PO TABS: 50.0000 mg | ORAL_TABLET | Freq: Every day | ORAL | 1 refills | Status: DC

## 2017-03-03 MED ORDER — INSULIN ASPART 100 UNIT/ML FLEXPEN: PEN_INJECTOR | SUBCUTANEOUS | 3 refills | Status: DC

## 2017-03-03 NOTE — Patient Instructions (Addendum)
Start Januvia 50 mg once daily  Continue Lantus 70 units daily Increase Novolog to   -35 units before breakfast  -15 units before lunch  -20-25 units before supper  Please call clinic if you have any low blood glucose episodes.  Followup with Dr Valentina Lucks in 3-4 weeks

## 2017-03-03 NOTE — Progress Notes (Signed)
Patient ID: Rebecca Walter, female   DOB: 08/15/1941, 75 y.o.   MRN: [id number] Reviewed: Agree with Dr. Koval's documentation and management. 

## 2017-03-03 NOTE — Assessment & Plan Note (Signed)
Diabetes longstanding currently moderately well-controlled. Patient denies hypoglycemic events and is able to verbalize appropriate hypoglycemia management plan. Patient reports adherence with medication. 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  Continued basal insulin Lantus (insulin glargine) at 70 units/day. Increased dose of rapid insulin Novolog (insulin aspart) to 15 units at lunch and 20-25 units at dinner. Continued Novolog breakfast dose of 35 units.  Initiated Januvia 50 mg daily.  Next A1C anticipated in 2-3 months.

## 2017-03-03 NOTE — Progress Notes (Signed)
    S:    Chief Complaint  Patient presents with  . Medication Management    Diabetes   Patient arrives in good spirits.  Presents for diabetes evaluation, education, and management in follow-up of GLP initiation last month.    Patient was last seen by her Primary Care Provider Dr.[name]3/05/2017 and in follow-up of visit with Dr. Andy Gauss 02/25/17 for complaints of a burning tongue associated with Trulicity on March 9th, 2018.  She reports grooves with her tongue which were painful when drinking soft drinks or eating.  She states this has improved since stopping Victoza.   Previously had yeast infections with Jardiance.    Patient reports Diabetes was diagnosed prior to 2008 when started insulin.    Patient reports adherence with medications.  Current diabetes medications include: Novolog 35 units with breakfast, 10 units with lunch (maximum) and 18 units before dinner (maximum) - she is consistent with her breakfast but self adjusts lunch and dinner.   Current hypertension medications include:   Patient denies hypoglycemic events.  Patient reported dietary habits: Eats 3 meals/day Breakfast and Dinner are her largest meals.  Lunch is smaller.   Patient reported exercise habits:    Patient reports nocturia 1x/night  Patient denies neuropathy. Patient reports self foot exams. Denies changes  O:  Physical Exam   ROS   Lab Results  Component Value Date   HGBA1C 7.2 01/25/2017   Vitals:   03/03/17 1331  BP: (!) 144/78  Pulse: 90    Home fasting CBG: 180-240  2 hour post-prandial/random CBG: 160-230  10 year ASCVD risk: >7.5%.  A/P: Diabetes longstanding currently moderately well-controlled. Patient denies hypoglycemic events and is able to verbalize appropriate hypoglycemia management plan. Patient reports adherence with medication. 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  Continued basal  insulin Lantus (insulin glargine) at 70 units/day. Increased dose of rapid insulin Novolog (insulin aspart) to 15 units at lunch and 20-25 units at dinner. Continued Novolog breakfast dose of 35 units.  Initiated Januvia 50 mg daily.  Next A1C anticipated in 2-3 months.    ASCVD risk greater than 7.5%. Continued Aspirin 81 mg and Continued lovastatin 40 mg.   Hypertension longstanding currently well controlled at 138/80 today.  Patient did not comment on adherence with medication.  Written patient instructions provided.  Total time in face to face counseling 20 minutes. Follow up in Pharmacist Clinic Visit in one month.   Patient seen with Myrna Blazer, PharmD Candidate, Ida Rogue, PharmD Candidate,. and Bennye Alm, PharmD, BCPS, PGY2 Resident.

## 2017-03-21 ENCOUNTER — Telehealth: Payer: Self-pay | Admitting: *Deleted

## 2017-03-21 NOTE — Telephone Encounter (Signed)
Patient reports elevated blood pressure with two readings this past weekend. One episode of feeling "hot" which resolved with rest.  States she is feeling improved since stopping Januvia.   Offered visit tomorrow.  States she woudl be OK with waiting until previously scheduled visit on Thursday (3 days from now).   Patient was accepting of the follow up in 3 days.   Asked to stop Januvia until her appointment in clinic.

## 2017-03-21 NOTE — Telephone Encounter (Signed)
Pt states that her Rebecca Walter that she started (03/03/17) is cause her BP to go up and having leg swelling.  She has not taken anymore since Saturday.  Her BP's were 196/87 on Saturday night and 185/84 on Sunday morning.  Would like for Dr. Valentina Lucks to let her know what to do.  Fleeger, Salome Spotted, CMA

## 2017-03-24 ENCOUNTER — Ambulatory Visit (INDEPENDENT_AMBULATORY_CARE_PROVIDER_SITE_OTHER): Payer: Medicare Other | Admitting: Pharmacist

## 2017-03-24 ENCOUNTER — Encounter: Payer: Self-pay | Admitting: Pharmacist

## 2017-03-24 ENCOUNTER — Ambulatory Visit: Payer: Medicare Other | Admitting: Pharmacist

## 2017-03-24 DIAGNOSIS — E118 Type 2 diabetes mellitus with unspecified complications: Secondary | ICD-10-CM | POA: Diagnosis not present

## 2017-03-24 DIAGNOSIS — I10 Essential (primary) hypertension: Secondary | ICD-10-CM

## 2017-03-24 MED ORDER — INSULIN ASPART 100 UNIT/ML FLEXPEN: PEN_INJECTOR | SUBCUTANEOUS | 3 refills | Status: DC

## 2017-03-24 MED ORDER — AMLODIPINE BESYLATE 5 MG PO TABS: 10.0000 mg | ORAL_TABLET | Freq: Every day | ORAL | 3 refills | Status: DC

## 2017-03-24 NOTE — Patient Instructions (Addendum)
Thank you for coming.   Please use the following doses for your sliding scale insulin, Novolog:  - Before breakfast: Take 30 units if your blood sugar is 80-180, if it is > 180 take 35 units - Before lunch: Take 15 units if your blood sugar is 80-180, if it is > 180 take 20 units - Before dinner: Take 20 units if your blood sugar is 80-180, if it is > 180 take 25 units  Continue to use Lantus 70 units daily.   Increase your amlodipine to 10mg  daily (take 2 - 5mg  tablets until you run out. A new prescription will be sent to your pharmacy).    Please call the clinic if you your blood sugar gets lower than 80. Also call the clinic if you notice swelling in your ankles or have blood pressure readings in the 190's.   Follow up with Dr. Valentina Lucks in  3 weeks.

## 2017-03-24 NOTE — Progress Notes (Signed)
    S:    Chief Complaint  Patient presents with  . Medication Management    Diabetes   Patient arrives 03/24/17.  Presents for diabetes evaluation, education, and management at the request of Dr Juleen China.  Patient was last seen by Primary Care Provider on 03/03/17. She started taking Januvia  After last visit but stopped taking because she noticed her blood pressure was elevated starting 3/31 with readings up to 180-190/80-90s mmHg. She stated her blood pressure has been high every since 3/31 and has continued this week despite discontinuing Januvia.  Patient is very hesitant about retrying Januvia.    Patient reports adherence with medications.  Current diabetes medications include: Lantus 70 units, Novolog 35 units before breakfast, 15 units before lunch, 18-20 units before supper, Metformin XR 500 mg BID Current hypertension medications include: amlodipine 5 mg daily, torsemide 20 mg daily, benazepril 40 mg BID  Patient denies hypoglycemic events. Reports no symptomatic low blood glucose readings, with lowest down to 130's.   O:  Physical Exam  Constitutional: She appears well-developed and well-nourished.  Musculoskeletal: She exhibits no edema.  Vitals reviewed.  ROS   Lab Results  Component Value Date   HGBA1C 7.2 01/25/2017   Lipid Panel     Component Value Date/Time   CHOL 163 07/09/2014 1500   TRIG 229 (H) 07/09/2014 1500   HDL 39 (L) 07/09/2014 1500   CHOLHDL 4.2 07/09/2014 1500   VLDL 46 (H) 07/09/2014 1500   LDLCALC 78 07/09/2014 1500   LDLDIRECT 103 (H) 03/11/2010 2127    Vitals:   03/24/17 1356  BP: (!) 168/62    Home fasting CBG: Range 139-214.  14 day average 192 mg/dL Pre Lunch CBG 14 day Average 169 mg/dL Pre Supper CBG 14 day Average 181 mg/dL   A/P: Diabetes longstanding diagnosed currently moderately well controlled but with increased CBGs since stopping Januvia due to adverse effect. Patient denies hypoglycemic events and is able to verbalize  appropriate hypoglycemia management plan. Patient reports adherence with medication. Options for pharmacotherapy are limited due to adverse effects from SGLT2, GLP1 agonists and DPP4 inhibitors.  -Stop Januvia -Continue Lantus 70 units daily  -Adjusted Novolog with the following sliding scale:  - Before breakfast: Take 30 units if your blood sugar is 80-180, if it is > 180 take 35 units - Before lunch: Take 15 units if your blood sugar is 80-180, if it is > 180 take 20 units - Before dinner: Take 20 units if your blood sugar is 80-180, if it is > 180 take 25 units -Discussed signs/symptoms/treatment of hypoglycemia and instructed her to call clinic with CBGs < 80 Next A1C anticipated 04/2017.    ASCVD risk greater than 7.5%. Continued Aspirin 81 mg and Continued lovastatin 40 mg daily.   Hypertension longstanding diagnosed currently uncontrolled.  Patient reports adherence with medication. Continue benazepril 40 mg BID.  Increase amlodipine to 10 mg daily.  Consider addition of Spironolactone/Eplerenone at next visit.  Instructed to call clinic with lower extremity edema or continued elevated SBPs.   Written patient instructions provided.  Total time in face to face counseling 45 minutes.   Follow up in Pharmacist Clinic Visit in 3 weeks.   Patient seen with Ida Rogue, PharmD Theophilus Bones, PharmD PGY-1 Resident and Bennye Alm, PharmD, BCPS, PGY2 Resident.

## 2017-03-24 NOTE — Assessment & Plan Note (Signed)
Diabetes longstanding diagnosed currently moderately well controlled but with increased CBGs since stopping Januvia due to adverse effect. Patient denies hypoglycemic events and is able to verbalize appropriate hypoglycemia management plan. Patient reports adherence with medication. Options for pharmacotherapy are limited due to adverse effects from SGLT2, GLP1 agonists and DPP4 inhibitors.  -Stop Januvia -Continue Lantus 70 units daily  -Adjusted Novolog with the following sliding scale:  - Before breakfast: Take 30 units if your blood sugar is 80-180, if it is > 180 take 35 units - Before lunch: Take 15 units if your blood sugar is 80-180, if it is > 180 take 20 units - Before dinner: Take 20 units if your blood sugar is 80-180, if it is > 180 take 25 units -Discussed signs/symptoms/treatment of hypoglycemia and instructed her to call clinic with CBGs < 80 Next A1C anticipated 04/2017.

## 2017-03-25 NOTE — Progress Notes (Signed)
Patient ID: Rebecca Walter, female   DOB: 01/19/1941, 75 y.o.   MRN: [id number] Reviewed: Agree with Dr. Koval's documentation and management. 

## 2017-03-29 ENCOUNTER — Other Ambulatory Visit: Payer: Self-pay | Admitting: Internal Medicine

## 2017-04-14 ENCOUNTER — Encounter: Payer: Self-pay | Admitting: Pharmacist

## 2017-04-14 ENCOUNTER — Ambulatory Visit (INDEPENDENT_AMBULATORY_CARE_PROVIDER_SITE_OTHER): Payer: Medicare Other | Admitting: Pharmacist

## 2017-04-14 DIAGNOSIS — E118 Type 2 diabetes mellitus with unspecified complications: Secondary | ICD-10-CM | POA: Diagnosis not present

## 2017-04-14 DIAGNOSIS — Z794 Long term (current) use of insulin: Secondary | ICD-10-CM | POA: Diagnosis not present

## 2017-04-14 DIAGNOSIS — I10 Essential (primary) hypertension: Secondary | ICD-10-CM

## 2017-04-14 MED ORDER — CARVEDILOL 3.125 MG PO TABS: 3.1250 mg | ORAL_TABLET | Freq: Two times a day (BID) | ORAL | 3 refills | Status: DC

## 2017-04-14 NOTE — Patient Instructions (Addendum)
Thank you for coming in today.   Start taking carvedilol (Cogreg) 3.125 mg twice a day for your blood pressure.  Restart your amlodipine 10mg  daily (take 2 - 5mg  tablets until you run out).  Please use the following doses for your sliding scale insulin, Novolog:  - Before breakfast: Take 30 units if your blood sugar is 80-180, if it is > 180 take 35 units - Before lunch: Take 15 units if your blood sugar is 80-180, if it is > 180 take 20 units - Before dinner: Take 20 units if your blood sugar is 80-180, if it is > 180 take 25 units  Continue to use Lantus 70 units daily.

## 2017-04-14 NOTE — Assessment & Plan Note (Signed)
Hypertension: Blood pressure has increased since stopping HCTZ at visit on 4/5. BP readings in office of 164/72 and 174/66. Home BP monitor reading in office of 189/81. Heart rate in office 84 and patient notes frequent anxiety. Patient non-adherent to amlodipine 10 mg daily. Re-initiate amlodipine 10 mg daily and initiate carvedilol 3.125 mg BID.

## 2017-04-14 NOTE — Progress Notes (Signed)
    S:     Chief Complaint  Patient presents with  . Medication Management    Diabetes    Patient arrives worried about her blood pressure.  Presents for diabetes evaluation, education, and management follow up. She reports her BP readings have been very high for the past week. She reports her BP was 204/89 this morning before taking her BP medications. She reports her head and neck hurt and she felt like she had a band around her head. She reports feeling aggravated recently because of her caretaker. She reports good blood glucose control.   Patient reports adherence with medications, except not taking amlodipine Current diabetes medications include: Lantus 70 units daily, Novolog SSI 30-35 units TID Current hypertension medications include: Benazepril 40-60 mg daily   Patient denies hypoglycemic events. Patient reported dietary habits: Eats 3 meals/day Patient reports visual changes.  O:  Physical Exam  Constitutional: She appears well-developed and well-nourished.  Musculoskeletal: She exhibits edema.  Trace bilaterally.    Review of Systems  Cardiovascular: Positive for leg swelling.     Lab Results  Component Value Date   HGBA1C 7.2 01/25/2017   Vitals:   04/14/17 1408 04/14/17 1409  BP: (!) 164/72 (!) 174/66    Home fasting CBG: 7 day average 173, highest 288.    A/P: Diabetes longstanding currently moderately controlled. Patient denies hypoglycemic events and is able to verbalize appropriate hypoglycemia management plan. Patient reports adherence with medication. Diabetes moderately controlled with 7 day BG average of 173 and A1c 7.2. Continue current insulin regimen of Lantus 70 units daily and novolog 30 units if BG <180 or 35 units if BG >180 TID.   Hypertension: Blood pressure has increased since stopping HCTZ at visit on 4/5. BP readings in office of 164/72 and 174/66. Home BP monitor reading in office of 189/81. Heart rate in office 84 and patient notes  frequent anxiety. Patient non-adherent to amlodipine 10 mg daily. Re-initiate amlodipine 10 mg daily and initiate carvedilol 3.125 mg BID.    Next A1C anticipated May 2018.    Written patient instructions provided.  Total time in face to face counseling 30 minutes.   Follow up in Pharmacist Clinic Visit 3 weeks.   Patient seen with Ida Rogue and Bennye Alm, PharmD, BCPS, PGY2 Resident.

## 2017-04-14 NOTE — Assessment & Plan Note (Signed)
Diabetes longstanding currently moderately controlled. Patient denies hypoglycemic events and is able to verbalize appropriate hypoglycemia management plan. Patient reports adherence with medication. Diabetes moderately controlled with 7 day BG average of 173 and A1c 7.2. Continue current insulin regimen of Lantus 70 units daily and novolog 30 units if BG <180 or 35 units if BG >180 TID.

## 2017-04-18 ENCOUNTER — Telehealth: Payer: Self-pay | Admitting: Internal Medicine

## 2017-04-18 ENCOUNTER — Encounter (HOSPITAL_COMMUNITY): Payer: Self-pay | Admitting: Emergency Medicine

## 2017-04-18 ENCOUNTER — Ambulatory Visit (HOSPITAL_COMMUNITY)
Admission: EM | Admit: 2017-04-18 | Discharge: 2017-04-18 | Disposition: A | Discharge status: Home / Self Care | Payer: Medicare Other | Attending: Internal Medicine | Admitting: Internal Medicine

## 2017-04-18 ENCOUNTER — Telehealth: Payer: Self-pay | Admitting: Pharmacist

## 2017-04-18 DIAGNOSIS — I10 Essential (primary) hypertension: Secondary | ICD-10-CM | POA: Diagnosis not present

## 2017-04-18 NOTE — Telephone Encounter (Signed)
Pt wants to talk to dr Valentina Lucks since he is the one that changed her medication last week

## 2017-04-18 NOTE — Telephone Encounter (Signed)
F/U of earlier phone call RE reaction to amlodipine.  Patient reported mild facial numbness after taking both carvedilol (new med) and amlodipine (which she has taken in the past).   She reports the symptom resolved.  She has continued to take Carvedilol 6.25mg  twice daily and has no additional reports of any symptoms with carvedilol.  She has stopped the amlodipine after explaining the symptoms to her pharmacist.   She admits stopping her benazepril. She was encouraged to resume the benazepril at the previous dose AND continue the carvedilol which she is tolerating at this time.   She again request a restart of her HCTZ which I encouraged her to avoid as she has had gout in the past.   She is willing to call and schedule an appointment for BP follow up with her PCP or with me later this week to reevaluate her Blood Pressure.  Clarified to STOP (continue to avoid) amlodipine and HCTZ at this time.    She will attempt to purchase a new home blood pressure device.

## 2017-04-18 NOTE — Progress Notes (Signed)
Patient ID: Rebecca Walter, female   DOB: 11/28/1941, 75 y.o.   MRN: [id number] Reviewed: Agree with Dr. Koval's documentation and management. 

## 2017-04-18 NOTE — ED Triage Notes (Signed)
The patient presented to the [facility] with a complaint of HTN. The patient stated that she saw her PCP 4 days ago and he added an additional HTN medication. The patient reported that she has an additional appointment in 3 days.

## 2017-04-18 NOTE — ED Provider Notes (Signed)
CSN: [id number]     Arrival date & time 04/18/17  1909 History   None    Chief Complaint  Patient presents with  . Hypertension   (Consider location/radiation/quality/duration/timing/severity/associated sxs/prior Treatment) Patient c/o HTN.  She was concerned that her BP was extremely high.  She states she forgot to take one of her medications.   The history is provided by the patient and a caregiver.  Hypertension  This is a new problem. The problem occurs constantly. Nothing aggravates the symptoms. She has tried nothing for the symptoms.    Past Medical History:  Diagnosis Date  . Anxiety   . Arthritis   . Diabetes mellitus   . GERD (gastroesophageal reflux disease)   . Gout   . Hyperlipidemia   . Hypertension   . Osteopenia    Past Surgical History:  Procedure Laterality Date  . KIDNEY SURGERY    . KNEE SURGERY    . MENISECTOMY     Family History  Problem Relation Age of Onset  . Stroke Mother   . Heart disease Father   . Stroke Father   . Hypertension Father   . Cancer Sister   . Cancer Brother   . Heart disease Brother   . Diabetes Maternal Uncle    Social History  Substance Use Topics  . Smoking status: Never Smoker  . Smokeless tobacco: Never Used  . Alcohol use No   OB History    No data available     Review of Systems  Constitutional: Negative.   HENT: Negative.   Eyes: Negative.   Respiratory: Negative.   Gastrointestinal: Negative.   Endocrine: Negative.   Genitourinary: Negative.   Allergic/Immunologic: Negative.   Neurological: Negative.   Hematological: Negative.   Psychiatric/Behavioral: Negative.     Allergies  Hctz [hydrochlorothiazide]; Victoza [liraglutide]; Acyclovir and related; and Beta adrenergic blockers  Home Medications   Prior to Admission medications   Medication Sig Start Date End Date Taking? Authorizing Provider  ACCU-CHEK AVIVA PLUS test strip CHECK BLOOD SUGAR FIRST  THING IN THE MORNING BEFORE EATING, AFTER  LUNCH, AND  AFTER DINNER TOTAL 3 TIMES  DAILY 10/25/16   Nicolette Bang, DO  allopurinol (ZYLOPRIM) 100 MG tablet TAKE 2 TABLETS BY MOUTH EVERY DAY 02/02/17   Nicolette Bang, DO  aspirin 81 MG chewable tablet Chew 1 tablet (81 mg total) by mouth daily. 03/22/11   Cletus Gash, MD  B-D ULTRAFINE III SHORT PEN 31G X 8 MM MISC USE AS DIRECTED 12/02/16   Nicolette Bang, DO  baclofen (LIORESAL) 10 MG tablet TAKE 1 TABLET BY MOUTH 3 TIMES A DAY AS NEEDED FOR MUSCLE SPASMS 11/10/16   Nicolette Bang, DO  benazepril (LOTENSIN) 20 MG tablet Take 1-2 tablets (20-40 mg total) by mouth 2 (two) times daily. Takes 40mg  in AM and 20mg  in evening. Patient taking differently: Take 40 mg by mouth 2 (two) times daily.  08/10/16   Veatrice Bourbon, MD  carvedilol (COREG) 3.125 MG tablet Take 1 tablet (3.125 mg total) by mouth 2 (two) times daily with a meal. 04/14/17   Zenia Resides, MD  HYDROcodone-acetaminophen (NORCO/VICODIN) 5-325 MG tablet Take 1 tablet by mouth every 6 (six) hours as needed. 12/01/16   Nicolette Bang, DO  hyoscyamine (LEVSIN, ANASPAZ) 0.125 MG tablet Take 1 tablet (0.125 mg total) by mouth every 4 (four) hours as needed. 01/14/17   Smiley Houseman, MD  insulin aspart (NOVOLOG FLEXPEN) 100 UNIT/ML FlexPen -  Before breakfast: Take 30 units if your blood sugar is 80-180, if it is > 180 take 35 units - Before lunch: Take 15 units if your blood sugar is 80-180, if it is > 180 take 20 units - Before dinner: Take 20 units if your blood sugar is 80-180, if it is > 180 take 25 units 03/24/17   Zenia Resides, MD  Lancets (FREESTYLE) lancets Use as directed    Historical Provider, MD  LANTUS SOLOSTAR 100 UNIT/ML Solostar Pen INJECT 70 UNITS INTO THE SKIN EVERY MORNING. 03/29/17   Nicolette Bang, DO  loratadine (CLARITIN) 10 MG tablet Take 1 tablet (10 mg total) by mouth daily. 03/22/11   Cletus Gash, MD  LORazepam (ATIVAN) 1 MG tablet Take 1  tablet (1 mg total) by mouth 2 (two) times daily as needed for anxiety. 01/25/17   Nicolette Bang, DO  lovastatin (MEVACOR) 40 MG tablet TAKE 2 TABLETS BY MOUTH EVERY DAY Patient taking differently: Take 1 tablet PO daily 10/20/16   Nicolette Bang, DO  metFORMIN (GLUCOPHAGE-XR) 500 MG 24 hr tablet TAKE 1 TABLET BY MOUTH 2 TIMES A DAY BEFORE MEALS 10/05/16   Nicolette Bang, DO  methocarbamol (ROBAXIN) 500 MG tablet Take 1 tablet (500 mg total) by mouth 2 (two) times daily. 11/06/16   Varney Biles, MD  metoCLOPramide (REGLAN) 5 MG tablet Take 1 tablet (5 mg total) by mouth 3 (three) times daily before meals. Patient taking differently: Take 5 mg by mouth 4 (four) times daily -  before meals and at bedtime.  03/22/11   Cletus Gash, MD  Multiple Vitamin (MULTIVITAMIN) tablet Take 1 tablet by mouth daily.      Historical Provider, MD  naproxen (NAPROSYN) 500 MG tablet Take 1 tablet (500 mg total) by mouth 2 (two) times daily with a meal. 11/01/16   Rogue Bussing, MD  pantoprazole (PROTONIX) 40 MG tablet TAKE 1 TABLET (40 MG TOTAL) BY MOUTH DAILY. Patient taking differently: TAKE 1 TABLET (40 MG TOTAL) BY MOUTH TWICE DAILY 08/20/13   Leone Haven, MD  Probiotic Product (PROBIOTIC PEARLS) CAPS Take 1 capsule by mouth once.     Historical Provider, MD  tiZANidine (ZANAFLEX) 4 MG tablet Take 1 tablet (4 mg total) by mouth every 6 (six) hours as needed for muscle spasms. 11/15/16   Nicolette Bang, DO  torsemide (DEMADEX) 20 MG tablet Take 1 tablet (20 mg total) by mouth daily. Reported on 04/15/2016 11/03/16   Nicolette Bang, DO  traMADol (ULTRAM) 50 MG tablet Take 1-2 tablets (50-100 mg total) by mouth every 8 (eight) hours as needed. 10/29/16   Nicolette Bang, DO  vitamin E (NATURAL MIXED TOCOPHEROLS) 400 UNIT capsule Take 400 Units by mouth daily.     Historical Provider, MD   Meds Ordered and Administered this Visit  Medications  - No data to display  BP (!) 169/70 (BP Location: Left Arm) Comment: notified cma  Pulse 74   Temp 98.5 F (36.9 C) (Oral)   Resp 16   SpO2 97%  No data found.   Physical Exam  Constitutional: She is oriented to person, place, and time. She appears well-developed and well-nourished.  HENT:  Head: Normocephalic and atraumatic.  Eyes: Conjunctivae and EOM are normal. Pupils are equal, round, and reactive to light.  Neck: Normal range of motion. Neck supple.  Cardiovascular: Normal rate, regular rhythm and normal heart sounds.   Pulmonary/Chest: Effort normal.  Abdominal: Soft. Bowel sounds  are normal.  Musculoskeletal: Normal range of motion.  Neurological: She is alert and oriented to person, place, and time.  Nursing note and vitals reviewed.   Urgent Care Course     Procedures (including critical care time)  Labs Review Labs Reviewed - No data to display  Imaging Review No results found.   Visual Acuity Review  Right Eye Distance:   Left Eye Distance:   Bilateral Distance:    Right Eye Near:   Left Eye Near:    Bilateral Near:         MDM   1. Essential hypertension    Reassurance given and advised patient to continue With PCP HTN regimen.  Follow up with PCP.      Lysbeth Penner, FNP 04/18/17 2108

## 2017-04-18 NOTE — Telephone Encounter (Signed)
pts bp machine is not registering her bp correctly. Pt would like a new machine.  Please advise   Pt also had a reaction to some new bp medication. Amlodipine. Left side of her face got numb.  She spoke to the pharmacist and told her not to take it anymore.  Please advise

## 2017-04-18 NOTE — Telephone Encounter (Addendum)
Pt would like Dr. Valentina Lucks to call her back about medication changes. Pt wants to make sure she isn't taking too much. Pt would like for you to call her back today, if possible. Pt stated it was ok to put her on your schedule for Thursday, so I did. If you need me to change this please let me know. ep

## 2017-04-18 NOTE — Discharge Instructions (Signed)
Please follow up with pcp.

## 2017-04-19 ENCOUNTER — Telehealth: Payer: Self-pay | Admitting: Pharmacist

## 2017-04-19 NOTE — Telephone Encounter (Signed)
Blood Pressure Medication clarification.   Patient reported getting a new blood pressure device yesterday.  She also reported going to urgent care where she had a reading of 121 systolic  (her new meter subsequently read 975 systolic).     She will continue to take carvedilol and benazepril until her visit on Thursday.   She was comfortable with this plan.  She was appreciative of the follow-up call.

## 2017-04-21 ENCOUNTER — Ambulatory Visit (INDEPENDENT_AMBULATORY_CARE_PROVIDER_SITE_OTHER): Payer: Medicare Other | Admitting: Pharmacist

## 2017-04-21 ENCOUNTER — Encounter: Payer: Self-pay | Admitting: Pharmacist

## 2017-04-21 DIAGNOSIS — I10 Essential (primary) hypertension: Secondary | ICD-10-CM | POA: Diagnosis not present

## 2017-04-21 DIAGNOSIS — M1000 Idiopathic gout, unspecified site: Secondary | ICD-10-CM | POA: Diagnosis not present

## 2017-04-21 MED ORDER — CARVEDILOL 6.25 MG PO TABS: 6.2500 mg | ORAL_TABLET | Freq: Two times a day (BID) | ORAL | 1 refills | Status: DC

## 2017-04-21 NOTE — Assessment & Plan Note (Signed)
Hypertension longstanding diagnosed currently above goal of <150/90 on current medications. Higher goal chosen given wide pulse pressure and risk for falls given ambulatory status walking with cane.  -Increase carvedilol to 6.25 mg twice daily -Continue benazepril 40 mg BID -Continue Torsemide 20 mg daily  -Will obtain BMET/Uric acid today.   -Consider addition of HCTZ or Spironolactone pending BMET/Uric acid results.   -Instructed patient to call if frequent SBP>180 or s/sx of orthostasis.

## 2017-04-21 NOTE — Progress Notes (Signed)
   S:    Patient arrives in good spirits ambulating with assistance of a cane.    Presents to the clinic for hypertension evaluation. Patient presented to urgent care on 04/18/17 with high blood pressure (169/70). She was last seen in pharmacy clinic on 04/14/17.  She also recently stopped her amlodipine due to potential side effect including facial numbness and tingling.  She was also confused and stopped her benazepril for a couple of days after starting carvedilol.    Patient reports adherence with medications.  Current BP Medications include:  Benazepril 40 mg BID, carvedilol 3.125 mg BID, torsemide 20 mg daily   Antihypertensives tried in the past include: HCTZ (stopped due to history of gout), amlodipine (stopped due to facial numbness/tingling)  Home BP readings: SBP 180-190s/70-80s (verified home BP cuff vs manual cuff in clinic today)   Gout Reports last gout attack 8-10 years ago. Reports adherence to allopurinol.   Diabetes Reports her CBGs are okay but did not bring CBG meter to clinic today.    Patient was recently discharged from hospital and all medications have been reviewed.  O:   Last 3 Office BP readings: BP Readings from Last 3 Encounters:  04/21/17 (!) 166/64  04/18/17 (!) 169/70  04/14/17 (!) 174/66    BMET    Component Value Date/Time   NA 141 11/06/2016 0024   K 3.9 11/06/2016 0024   CL 103 11/06/2016 0024   CO2 26 11/06/2016 0024   GLUCOSE 185 (H) 11/06/2016 0024   BUN 14 11/06/2016 0024   CREATININE 0.84 11/06/2016 0024   CREATININE 0.93 09/16/2016 1410   CALCIUM 9.2 11/06/2016 0024   GFRNONAA >60 11/06/2016 0024   GFRNONAA 70 02/06/2015 1418   GFRAA >60 11/06/2016 0024   GFRAA 81 02/06/2015 1418    A/P: Hypertension longstanding diagnosed currently above goal of <150/90 on current medications. Higher goal chosen given wide pulse pressure and risk for falls given ambulatory status walking with cane.  -Increase carvedilol to 6.25 mg twice  daily -Continue benazepril 40 mg BID -Continue Torsemide 20 mg daily  -Will obtain BMET/Uric acid today.   -Consider addition of HCTZ or Spironolactone pending BMET/Uric acid results.   -Instructed patient to call if frequent SBP>180 or s/sx of orthostasis.   Results reviewed and written information provided.   Total time in face-to-face counseling 30 minutes.   F/U Clinic Visit with Dr. Valentina Lucks or Dr Juleen China in 2 weeks.  Patient seen with Jerrye Noble, PharmD Candidate, and Arrie Senate, PharmD PGY-1 Resident and Bennye Alm, PharmD, BCPS, PGY2 Resident.

## 2017-04-21 NOTE — Patient Instructions (Addendum)
Increase carvedilol to 6.25 mg twice daily (take two 3.125 mg tablets twice daily until you finish your current supply).   Then start 6.25mg  dose from pharmacy.   We will get lab work today to check kidney function and electrolytes.  Please call clinic if you have any dizziness or feeling lightheaded or if you continue to have continued home blood pressure readings > 180   Followup in 2 weeks with Dr. Juleen China or Dr. Valentina Lucks

## 2017-04-21 NOTE — Progress Notes (Signed)
Patient ID: Rebecca Walter, female   DOB: 02/03/1941, 75 y.o.   MRN: [id number] Reviewed: Agree with Dr. Koval's documentation and management. 

## 2017-04-22 LAB — BASIC METABOLIC PANEL
BUN/Creatinine Ratio: 20 (ref 12–28)
BUN: 16 mg/dL (ref 8–27)
CO2: 24 mmol/L (ref 18–29)
CREATININE: 0.79 mg/dL (ref 0.57–1.00)
Calcium: 9.2 mg/dL (ref 8.7–10.3)
Chloride: 98 mmol/L (ref 96–106)
GFR calc Af Amer: 84 mL/min/{1.73_m2} (ref >59)
GFR calc non Af Amer: 72 mL/min/{1.73_m2} (ref >59)
GLUCOSE: 167 mg/dL — AB (ref 65–99)
Potassium: 3.5 mmol/L (ref 3.5–5.2)
SODIUM: 141 mmol/L (ref 134–144)

## 2017-04-22 LAB — URIC ACID: URIC ACID: 5.4 mg/dL (ref 2.5–7.1)

## 2017-04-26 ENCOUNTER — Other Ambulatory Visit: Payer: Self-pay | Admitting: Internal Medicine

## 2017-04-27 ENCOUNTER — Telehealth: Payer: Self-pay | Admitting: *Deleted

## 2017-04-27 MED ORDER — GLUCOSE BLOOD VI STRP: ORAL_STRIP | 3 refills | Status: DC

## 2017-05-04 NOTE — Telephone Encounter (Signed)
Test strips filled.  Derl Barrow, RN

## 2017-05-05 ENCOUNTER — Ambulatory Visit: Payer: Medicare Other | Admitting: Pharmacist

## 2017-05-09 ENCOUNTER — Telehealth: Payer: Self-pay | Admitting: Internal Medicine

## 2017-05-09 ENCOUNTER — Other Ambulatory Visit: Payer: Self-pay | Admitting: Internal Medicine

## 2017-05-09 DIAGNOSIS — F411 Generalized anxiety disorder: Secondary | ICD-10-CM

## 2017-05-09 MED ORDER — LORAZEPAM 1 MG PO TABS: 1.0000 mg | ORAL_TABLET | Freq: Two times a day (BID) | ORAL | 2 refills | Status: DC | PRN

## 2017-05-09 NOTE — Telephone Encounter (Signed)
Pt called and would like a refill on her Lorazepam to be left up front for tomorrow 05/10/17  since she will be here with Dr. Valentina Lucks. Blima Rich

## 2017-05-09 NOTE — Progress Notes (Signed)
Informed pt. Ottis Stain, CMA

## 2017-05-09 NOTE — Progress Notes (Signed)
I have provided refill for Ativan. Please remind patient that she needs to give at least 48 hours for paper prescription requests in the future.  Phill Myron, D.O. 05/09/2017, 4:51 PM PGY-2, [place]

## 2017-05-10 ENCOUNTER — Ambulatory Visit (INDEPENDENT_AMBULATORY_CARE_PROVIDER_SITE_OTHER): Payer: Medicare Other | Admitting: Pharmacist

## 2017-05-10 ENCOUNTER — Encounter: Payer: Self-pay | Admitting: Pharmacist

## 2017-05-10 DIAGNOSIS — Z794 Long term (current) use of insulin: Secondary | ICD-10-CM | POA: Diagnosis not present

## 2017-05-10 DIAGNOSIS — E118 Type 2 diabetes mellitus with unspecified complications: Secondary | ICD-10-CM | POA: Diagnosis not present

## 2017-05-10 DIAGNOSIS — I10 Essential (primary) hypertension: Secondary | ICD-10-CM

## 2017-05-10 MED ORDER — SPIRONOLACTONE 25 MG PO TABS: 12.5000 mg | ORAL_TABLET | Freq: Every day | ORAL | 1 refills | Status: DC

## 2017-05-10 NOTE — Progress Notes (Signed)
   S:    Patient arrives walking with cane in good spirits. Presents to the clinic for hypertension evaluation. Patient also brings CBG log and most readings are 160-200s. Pt endorses adherence to a low salt diet, but states occasional intake of salty foods. Patient reports adherence with medications and denies seeing improvement in BP after increasing carvedilol after last appointment. Pt endorses recent SOB which resolved spontaneously by the next day, occasional LEE, and notes recent life stressors that could increase her anxiety.  Current BP Medications include:  Benazepril 40mg  BID (pt does not always take evening dose), Coreg 6.25mg  BID, torsemide 20mg  daily  Antihypertensives tried in the past include: HCTZ (stopped 2/2 hx gout), amlodipine (stopped due to facial numbness/tingling)   O:   Last 3 Office BP readings: BP Readings from Last 3 Encounters:  05/10/17 (!) 164/62  04/21/17 (!) 166/64  04/18/17 (!) 169/70    BMET    Component Value Date/Time   NA 141 04/21/2017 1403   K 3.5 04/21/2017 1403   CL 98 04/21/2017 1403   CO2 24 04/21/2017 1403   GLUCOSE 167 (H) 04/21/2017 1403   GLUCOSE 185 (H) 11/06/2016 0024   BUN 16 04/21/2017 1403   CREATININE 0.79 04/21/2017 1403   CREATININE 0.93 09/16/2016 1410   CALCIUM 9.2 04/21/2017 1403   GFRNONAA 72 04/21/2017 1403   GFRNONAA 70 02/06/2015 1418   GFRAA 84 04/21/2017 1403   GFRAA 81 02/06/2015 1418   Uric Acid (04/21/17): 5.4 mg/dl  A/P: Hypertension longstanding currently uncontrolled on current medications and above goal of <150/90. Higher goal chosen given wide pulse pressure noted in clinic and falls risk given use of cane to assist walking. Pt's self-reported SOB likely 2/2 stress/anxiety and LEE likely due to sedentary lifestyle. Potassium wnl on last BMET on 04/21/17. -Continue benazepril 40mg  BID, carvedilol 6.25mg  BID, and torsemide 20mg  daily -Initiate spironolactone 12.5mg  daily -Follow-up in 1 week to assess BP and  BMET -Consider increasing spironolactone to 25mg  as able if needed for additional BP control, or restarting HCTZ if inadequate response to spironolactone given recent uric acid < 6 mg/dl  Diabetes - controlled without any low readings < 100 or symptomatic episodes.   Continue same doses on insulin at this time Novolog 30 units TID is her typical dose with some 35 when > 200 and insulin glargine 70 units once daily.  Results reviewed and written information provided.   Total time in face-to-face counseling 20 minutes.   F/U Clinic Visit with Dr. Valentina Lucks in 1 week.  Patient seen with Jerrye Noble, PharmD Candidate, and Arrie Senate, PharmD PGY-1 Resident.

## 2017-05-10 NOTE — Assessment & Plan Note (Signed)
Diabetes - controlled without any low readings < 100 or symptomatic episodes.   Continue same doses on insulin at this time Novolog 30 units TID is her typical dose with some 35 when > 200 and insulin glargine 70 units once daily.

## 2017-05-10 NOTE — Patient Instructions (Addendum)
Continue carvedilol, torsemide, and benazepril.  Initiate spironolactone 1/2 tablet (12.5mg ) by mouth once daily.  Follow-up with pharmacy clinic in 1 week to assess blood pressure and obtain bloodwork.

## 2017-05-10 NOTE — Assessment & Plan Note (Signed)
Hypertension longstanding currently uncontrolled on current medications and above goal of <150/90. Higher goal chosen given wide pulse pressure noted in clinic and falls risk given use of cane to assist walking. Pt's self-reported SOB likely 2/2 stress/anxiety and LEE likely due to sedentary lifestyle. Potassium wnl on last BMET on 04/21/17. -Continue benazepril 40mg  BID, carvedilol 6.25mg  BID, and torsemide 20mg  daily -Initiate spironolactone 12.5mg  daily -Follow-up in 1 week to assess BP and BMET -Consider increasing spironolactone to 25mg  as able if needed for additional BP control, or restarting HCTZ if inadequate response to spironolactone given recent uric acid < 6 mg/dl

## 2017-05-10 NOTE — Progress Notes (Signed)
Patient ID: Rebecca Walter, female   DOB: 05/13/1941, 75 y.o.   MRN: [id number] Reviewed: Agree with Dr. Koval's documentation and management. 

## 2017-05-17 ENCOUNTER — Ambulatory Visit (INDEPENDENT_AMBULATORY_CARE_PROVIDER_SITE_OTHER): Payer: Medicare Other | Admitting: Pharmacist

## 2017-05-17 ENCOUNTER — Encounter: Payer: Self-pay | Admitting: Pharmacist

## 2017-05-17 DIAGNOSIS — Z794 Long term (current) use of insulin: Secondary | ICD-10-CM

## 2017-05-17 DIAGNOSIS — E118 Type 2 diabetes mellitus with unspecified complications: Secondary | ICD-10-CM

## 2017-05-17 DIAGNOSIS — I10 Essential (primary) hypertension: Secondary | ICD-10-CM

## 2017-05-17 NOTE — Progress Notes (Signed)
   S:    Patient arrives ambulating with a cane. Presents to the clinic for hypertension evaluation and follow-up BMET after starting spironolactone last week.  Patient reports adherence with medications.  Current BP Medications include:  Carvedilol 6.25mg  BID, benazepril 40mg  BID, torsemide 20mg  daily, spironolactone 12.5mg  daily  Antihypertensives tried in the past include: HCTZ (stopped 2/2 hx gout), amlodipine (stopped due to facial numbness/tingling)   O:   Last 3 Office BP readings: BP Readings from Last 3 Encounters:  05/17/17 (!) 144/66  05/10/17 (!) 164/62  04/21/17 (!) 166/64    BMET    Component Value Date/Time   NA 141 04/21/2017 1403   K 3.5 04/21/2017 1403   CL 98 04/21/2017 1403   CO2 24 04/21/2017 1403   GLUCOSE 167 (H) 04/21/2017 1403   GLUCOSE 185 (H) 11/06/2016 0024   BUN 16 04/21/2017 1403   CREATININE 0.79 04/21/2017 1403   CREATININE 0.93 09/16/2016 1410   CALCIUM 9.2 04/21/2017 1403   GFRNONAA 72 04/21/2017 1403   GFRNONAA 70 02/06/2015 1418   GFRAA 84 04/21/2017 1403   GFRAA 81 02/06/2015 1418    A/P: Hypertension longstanding currently well-controlled and below goal of <150/90 mmHg after initiating spironolactone at last visit.  -Continue carvedilol 6.25mg  BID, benazepril 40mg  BID, torsemide 20mg  once daily, and spironolactone 12.5mg  once daily  -Follow-up CMET today to assess SCr and K after initiating spironolactone  -Consider increasing spironolactone if needed for additional BP control if potassium allows  Diabetes - improved control with recent regiment adjustments.  Denies any low readings.  No change at this visit.   Results reviewed and written information provided.   Total time in face-to-face counseling 20 minutes.   F/U Clinic Visit with Dr. Juleen China.  Patient seen with Jerrye Noble, PharmD Candidate, and Arrie Senate, PharmD PGY-1 Resident.

## 2017-05-17 NOTE — Progress Notes (Signed)
Patient ID: Rebecca Walter, female   DOB: 01/03/1941, 75 y.o.   MRN: [id number] Reviewed: Agree with Dr. Koval's documentation and management. 

## 2017-05-17 NOTE — Assessment & Plan Note (Signed)
Diabetes - improved control with recent regiment adjustments.  Denies any low readings.  No change at this visit.

## 2017-05-17 NOTE — Assessment & Plan Note (Signed)
Hypertension longstanding currently well-controlled and below goal of <150/90 mmHg after initiating spironolactone at last visit.  -Continue carvedilol 6.25mg  BID, benazepril 40mg  BID, torsemide 20mg  once daily, and spironolactone 12.5mg  once daily  -Follow-up CMET today to assess SCr and K after initiating spironolactone  -Consider increasing spironolactone if needed for additional BP control if potassium allows

## 2017-05-17 NOTE — Patient Instructions (Signed)
It was great to see you today!  Continue taking carvedilol 6.25mg  twice daily, benazepril 40mg  twice daily, torsemide 20mg  once daily, spironolactone 12.5mg  once daily.  Follow-up with Dr. Juleen China in 1 month.

## 2017-05-18 LAB — COMPREHENSIVE METABOLIC PANEL
ALT: 18 IU/L (ref 0–32)
AST: 22 IU/L (ref 0–40)
Albumin/Globulin Ratio: 1.7 (ref 1.2–2.2)
Albumin: 4.3 g/dL (ref 3.5–4.8)
Alkaline Phosphatase: 55 IU/L (ref 39–117)
BUN/Creatinine Ratio: 19 (ref 12–28)
BUN: 14 mg/dL (ref 8–27)
Bilirubin Total: 0.3 mg/dL (ref 0.0–1.2)
CALCIUM: 9.4 mg/dL (ref 8.7–10.3)
CO2: 26 mmol/L (ref 18–29)
CREATININE: 0.75 mg/dL (ref 0.57–1.00)
Chloride: 102 mmol/L (ref 96–106)
GFR, EST AFRICAN AMERICAN: 89 mL/min/{1.73_m2} (ref >59)
GFR, EST NON AFRICAN AMERICAN: 77 mL/min/{1.73_m2} (ref >59)
Globulin, Total: 2.5 g/dL (ref 1.5–4.5)
Glucose: 151 mg/dL — ABNORMAL HIGH (ref 65–99)
Potassium: 3.9 mmol/L (ref 3.5–5.2)
Sodium: 143 mmol/L (ref 134–144)
Total Protein: 6.8 g/dL (ref 6.0–8.5)

## 2017-05-21 ENCOUNTER — Other Ambulatory Visit: Payer: Self-pay | Admitting: Internal Medicine

## 2017-05-31 ENCOUNTER — Other Ambulatory Visit: Payer: Self-pay | Admitting: Internal Medicine

## 2017-06-03 ENCOUNTER — Telehealth: Payer: Self-pay | Admitting: Internal Medicine

## 2017-06-03 NOTE — Telephone Encounter (Signed)
Pt is calling because Demadex the brand name is no longer being made. She needs a refill sent in on Torsemide called in. She has enough for the next 2 days but will be out by Monday. jw

## 2017-06-04 MED ORDER — TORSEMIDE 20 MG PO TABS: 20.0000 mg | ORAL_TABLET | Freq: Every day | ORAL | 5 refills | Status: DC

## 2017-06-04 NOTE — Telephone Encounter (Signed)
Have sent in refill for Torsemide in place of Demadex.   Phill Myron, D.O. 06/04/2017, 11:44 AM PGY-2, [place]

## 2017-06-06 ENCOUNTER — Other Ambulatory Visit: Payer: Self-pay | Admitting: *Deleted

## 2017-06-06 DIAGNOSIS — I10 Essential (primary) hypertension: Secondary | ICD-10-CM

## 2017-06-06 NOTE — Telephone Encounter (Signed)
Spoke to pt. She knows the medication is at the pharmacy but hasn't picked it up yet. She has an old Rx she is going to call the pharmacy to see if she can take that to avoid going out in the heat.

## 2017-06-07 MED ORDER — BENAZEPRIL HCL 20 MG PO TABS: 40.0000 mg | ORAL_TABLET | Freq: Two times a day (BID) | ORAL | 5 refills | Status: DC

## 2017-06-07 NOTE — Telephone Encounter (Signed)
2nd request.  Martin, Tamika L, RN  

## 2017-06-13 ENCOUNTER — Telehealth: Payer: Self-pay | Admitting: Internal Medicine

## 2017-06-19 DIAGNOSIS — I350 Nonrheumatic aortic (valve) stenosis: Secondary | ICD-10-CM

## 2017-06-19 DIAGNOSIS — I371 Nonrheumatic pulmonary valve insufficiency: Secondary | ICD-10-CM | POA: Insufficient documentation

## 2017-06-19 DIAGNOSIS — I340 Nonrheumatic mitral (valve) insufficiency: Secondary | ICD-10-CM

## 2017-06-19 HISTORY — DX: Nonrheumatic pulmonary valve insufficiency: I37.1

## 2017-06-19 HISTORY — DX: Nonrheumatic aortic (valve) stenosis: I35.0

## 2017-06-19 HISTORY — DX: Nonrheumatic mitral (valve) insufficiency: I34.0

## 2017-06-21 ENCOUNTER — Encounter: Payer: Self-pay | Admitting: Internal Medicine

## 2017-06-21 ENCOUNTER — Ambulatory Visit (INDEPENDENT_AMBULATORY_CARE_PROVIDER_SITE_OTHER): Payer: Medicare Other | Admitting: Internal Medicine

## 2017-06-21 VITALS — BP 140/65 | HR 84 | Temp 98.8°F | Ht 60.0 in | Wt 202.2 lb

## 2017-06-21 DIAGNOSIS — M25551 Pain in right hip: Secondary | ICD-10-CM

## 2017-06-21 DIAGNOSIS — F411 Generalized anxiety disorder: Secondary | ICD-10-CM

## 2017-06-21 DIAGNOSIS — I10 Essential (primary) hypertension: Secondary | ICD-10-CM

## 2017-06-21 MED ORDER — BACLOFEN 10 MG PO TABS: ORAL_TABLET | ORAL | 2 refills | Status: DC

## 2017-06-21 MED ORDER — LORAZEPAM 1 MG PO TABS: 1.0000 mg | ORAL_TABLET | Freq: Two times a day (BID) | ORAL | 2 refills | Status: DC | PRN

## 2017-06-21 NOTE — Patient Instructions (Addendum)
I have sent in the muscle relaxer to your pharmacy. I have placed a referral for Physical Therapy for your hip pain. Continue with the heating pad and Icy/hot rub as needed.   Please follow up with me or Dr. Valentina Lucks in one month to check on your blood pressure and diabetes. Your blood pressure was normal on repeat check so we do not need to adjust medications right now.

## 2017-06-21 NOTE — Progress Notes (Signed)
Subjective:    Rebecca Walter - 75 y.o. female MRN [id number]  Date of birth: 1941/07/02  HPI  Rebecca Walter is here for follow up of chronic medical conditions.  Chronic HTN Disease Monitoring:  Home BP Monitoring - Checks BP at home occasionally. Reports BPs at home have been 140-150/80-90. She did have an isolated elevated reading of 366Y systolic about 3 weeks ago when she took a Vicodin for pain.  Chest pain- no  Dyspnea- no Headache - once with the elevated BP three weeks ago   Medications: Carvedilol 6.25mg  BID, benazepril 40mg  BID, torsemide 20mg  once daily, and spironolactone 12.5mg  once daily  Compliance- yes Lightheadedness- no  Edema- no    Hip Pain: Has a history of herniated lumbar disc and spinal stenosis with referred pain to right hip and radicular right leg pain. Patient reports she was seen by neurosurgery and elected to not have surgery given her age. Pain is mostly well controlled. She tries to be careful when going from sitting to standing or changing positions as too quick of movements seems to exacerbate her pain. Her pain has been mostly well controlled. Located in the right flank and hip; no longer having radiation down leg. She feels like she is currently having a flare up of her pain. Requests muscle relaxer. Has been using Ibuprofen, heating pad, and Icy-hot rub with decent relief of pain. No numbness/tingling of the extremities. No falls or weakness.    -  reports that she has never smoked. She has never used smokeless tobacco. - Review of Systems: Per HPI. - Past Medical History: Patient Active Problem List   Diagnosis Date Noted  . Yeast vaginitis 01/25/2017  . Lumbar back pain with radiculopathy affecting right lower extremity 10/29/2016  . Elevated BP 08/22/2015  . Difficulty urinating 02/06/2015  . Right shoulder pain 04/25/2014  . Allergic rhinitis 03/22/2011  . INSOMNIA 09/16/2009  . GASTROPARESIS 08/12/2009  . Anxiety  10/18/2008  . SYSTOLIC MURMUR 40/34/7425  . DYSKINESIA, ESOPHAGUS 09/13/2007  . CARCINOMA, BASAL CELL 05/26/2007  . Diabetes mellitus, type II ([place]) 02/16/2007  . HYPERLIPIDEMIA 02/16/2007  . OBESITY, NOS 02/16/2007  . HYPERTENSION, BENIGN SYSTEMIC 02/16/2007  . GASTROESOPHAGEAL REFLUX, NO ESOPHAGITIS 02/16/2007  . OSTEOARTHRITIS, MULTI SITES 02/16/2007  . OSTEOPENIA 02/16/2007  . Gout 02/16/2007   - Medications: reviewed and updated   Objective:   Physical Exam BP 140/65 (BP Location: Left Arm, Patient Position: Sitting, Cuff Size: Large)   Pulse 84   Temp 98.8 F (37.1 C) (Oral)   Ht 5' (1.524 m)   Wt 202 lb 3.2 oz (91.7 kg)   SpO2 92%   BMI 39.49 kg/m  Gen: NAD, alert, cooperative with exam, well-appearing CV: RRR, good S1/S2, no murmur Resp: CTABL, no wheezes, non-labored MSK: TTP over right lumbar paraspinal muscles. No TTP over right trochanter. Full ROM at hip./ Neuro: Strength 5/5 in LE bilaterally. Sensation grossly intact to LE. Negative straight leg test. Negative log roll.     Assessment & Plan:   1. Pain of right hip joint Suspect referred pain related to known spinal stenosis and disc protrusion. Could also consider greater trochanter bursitis as cause of pain.  - baclofen (LIORESAL) 10 MG tablet; TAKE 1 TABLET BY MOUTH 3 TIMES A DAY AS NEEDED FOR MUSCLE SPASMS  Dispense: 30 tablet; Refill: 2 - Ambulatory referral to Physical Therapy -continue with supportive care such as OTC analgesics, icy-hot rub, heating pad  -if pain does not improve with  PT, would consider injection for possible bursitis   2. Essential hypertension BP initially elevated (164/80) but normalized with repeat measurement. BP at goal so will not adjust any medications at present. Follow up with PCP or Koval in one month.   3. Generalized anxiety disorder - LORazepam (ATIVAN) 1 MG tablet; Take 1 tablet (1 mg total) by mouth 2 (two) times daily as needed for anxiety.  Dispense: 60 tablet;  Refill: [place], D.O. 06/21/2017, 3:26 PM PGY-3, [place]

## 2017-06-23 ENCOUNTER — Other Ambulatory Visit: Payer: Self-pay | Admitting: Family Medicine

## 2017-06-23 DIAGNOSIS — I10 Essential (primary) hypertension: Secondary | ICD-10-CM

## 2017-06-25 ENCOUNTER — Other Ambulatory Visit: Payer: Self-pay | Admitting: Family Medicine

## 2017-06-25 DIAGNOSIS — I10 Essential (primary) hypertension: Secondary | ICD-10-CM

## 2017-07-04 ENCOUNTER — Other Ambulatory Visit: Payer: Self-pay | Admitting: *Deleted

## 2017-07-04 DIAGNOSIS — I10 Essential (primary) hypertension: Secondary | ICD-10-CM

## 2017-07-05 ENCOUNTER — Ambulatory Visit: Payer: Medicare Other | Admitting: Physical Therapy

## 2017-07-05 MED ORDER — BENAZEPRIL HCL 20 MG PO TABS: 40.0000 mg | ORAL_TABLET | Freq: Two times a day (BID) | ORAL | 5 refills | Status: DC

## 2017-07-06 ENCOUNTER — Telehealth: Payer: Self-pay | Admitting: Internal Medicine

## 2017-07-06 NOTE — Telephone Encounter (Signed)
Pt called because her Benazepril is sent in at the wrong dosage amounts per day. Please call her ASAP. Blima Rich

## 2017-07-07 NOTE — Telephone Encounter (Signed)
Contacted pt and she stated that she was needing a refill on her 20 mg dose of benazepril.  She says she takes 40 in the AM and 20 in the pm.  She says she usually has the two different Rx's and this is the one she needs refilled. Told pt that we would contact her as soon as we heard something back from doctor.  She did say that she had an appointment at 1:30 today if we tried her at that time.  Katharina Caper, April D, [place]

## 2017-07-11 ENCOUNTER — Other Ambulatory Visit: Payer: Self-pay | Admitting: Internal Medicine

## 2017-07-11 DIAGNOSIS — I10 Essential (primary) hypertension: Secondary | ICD-10-CM

## 2017-07-11 MED ORDER — BENAZEPRIL HCL 40 MG PO TABS: 40.0000 mg | ORAL_TABLET | Freq: Every day | ORAL | 3 refills | Status: DC

## 2017-07-11 MED ORDER — BENAZEPRIL HCL 20 MG PO TABS: 20.0000 mg | ORAL_TABLET | Freq: Every day | ORAL | 3 refills | Status: DC

## 2017-07-11 NOTE — Progress Notes (Signed)
LVM for pt to call the office. If pt calls, please give her the information below. Mardel Grudzien T Hondo Nanda, CMA  

## 2017-07-11 NOTE — Progress Notes (Signed)
Have sent in separate Rx's for her Benazepril 40 mg in the morning and 20 mg in the evening.   Phill Myron, D.O. 07/11/2017, 12:01 PM PGY-3, [place]

## 2017-07-12 ENCOUNTER — Ambulatory Visit: Payer: Medicare Other | Admitting: Physical Therapy

## 2017-07-12 ENCOUNTER — Ambulatory Visit: Payer: Medicare Other | Admitting: Rehabilitative and Restorative Service Providers"

## 2017-07-12 DIAGNOSIS — H43813 Vitreous degeneration, bilateral: Secondary | ICD-10-CM | POA: Diagnosis not present

## 2017-07-12 DIAGNOSIS — H35411 Lattice degeneration of retina, right eye: Secondary | ICD-10-CM | POA: Diagnosis not present

## 2017-07-12 DIAGNOSIS — E119 Type 2 diabetes mellitus without complications: Secondary | ICD-10-CM | POA: Diagnosis not present

## 2017-07-12 DIAGNOSIS — H33102 Unspecified retinoschisis, left eye: Secondary | ICD-10-CM | POA: Diagnosis not present

## 2017-07-12 LAB — HM DIABETES EYE EXAM

## 2017-07-12 NOTE — Progress Notes (Signed)
Left detailed message on home phone informing pt of the below message. Katharina Caper, April D, [place]

## 2017-07-14 ENCOUNTER — Encounter (HOSPITAL_COMMUNITY): Payer: Self-pay | Admitting: Emergency Medicine

## 2017-07-14 ENCOUNTER — Telehealth: Payer: Self-pay | Admitting: Family Medicine

## 2017-07-14 DIAGNOSIS — M109 Gout, unspecified: Secondary | ICD-10-CM | POA: Diagnosis not present

## 2017-07-14 DIAGNOSIS — Z79899 Other long term (current) drug therapy: Secondary | ICD-10-CM

## 2017-07-14 DIAGNOSIS — I110 Hypertensive heart disease with heart failure: Secondary | ICD-10-CM | POA: Diagnosis not present

## 2017-07-14 DIAGNOSIS — I5032 Chronic diastolic (congestive) heart failure: Secondary | ICD-10-CM | POA: Insufficient documentation

## 2017-07-14 DIAGNOSIS — K219 Gastro-esophageal reflux disease without esophagitis: Secondary | ICD-10-CM | POA: Insufficient documentation

## 2017-07-14 DIAGNOSIS — R51 Headache: Secondary | ICD-10-CM

## 2017-07-14 DIAGNOSIS — I160 Hypertensive urgency: Secondary | ICD-10-CM | POA: Diagnosis not present

## 2017-07-14 DIAGNOSIS — Z7982 Long term (current) use of aspirin: Secondary | ICD-10-CM | POA: Insufficient documentation

## 2017-07-14 DIAGNOSIS — E119 Type 2 diabetes mellitus without complications: Secondary | ICD-10-CM

## 2017-07-14 DIAGNOSIS — Z794 Long term (current) use of insulin: Secondary | ICD-10-CM

## 2017-07-14 DIAGNOSIS — I161 Hypertensive emergency: Secondary | ICD-10-CM | POA: Diagnosis not present

## 2017-07-14 DIAGNOSIS — I700 Atherosclerosis of aorta: Secondary | ICD-10-CM | POA: Insufficient documentation

## 2017-07-14 DIAGNOSIS — R7989 Other specified abnormal findings of blood chemistry: Secondary | ICD-10-CM | POA: Diagnosis not present

## 2017-07-14 DIAGNOSIS — E785 Hyperlipidemia, unspecified: Secondary | ICD-10-CM | POA: Diagnosis not present

## 2017-07-14 DIAGNOSIS — I10 Essential (primary) hypertension: Secondary | ICD-10-CM | POA: Insufficient documentation

## 2017-07-14 DIAGNOSIS — R0602 Shortness of breath: Secondary | ICD-10-CM | POA: Diagnosis not present

## 2017-07-14 DIAGNOSIS — F419 Anxiety disorder, unspecified: Secondary | ICD-10-CM | POA: Diagnosis not present

## 2017-07-14 DIAGNOSIS — G4700 Insomnia, unspecified: Secondary | ICD-10-CM | POA: Diagnosis not present

## 2017-07-14 DIAGNOSIS — Z7984 Long term (current) use of oral hypoglycemic drugs: Secondary | ICD-10-CM

## 2017-07-14 LAB — URINALYSIS, ROUTINE W REFLEX MICROSCOPIC
Bilirubin Urine: NEGATIVE
GLUCOSE, UA: NEGATIVE mg/dL
Hgb urine dipstick: NEGATIVE
Ketones, ur: NEGATIVE mg/dL
Nitrite: NEGATIVE
PH: 7 (ref 5.0–8.0)
Protein, ur: 100 mg/dL — AB
RBC / HPF: NONE SEEN RBC/hpf (ref 0–5)
Specific Gravity, Urine: 1.006 (ref 1.005–1.030)

## 2017-07-14 LAB — BASIC METABOLIC PANEL
Anion gap: 9 (ref 5–15)
BUN: 15 mg/dL (ref 6–20)
CALCIUM: 9.3 mg/dL (ref 8.9–10.3)
CHLORIDE: 102 mmol/L (ref 101–111)
CO2: 30 mmol/L (ref 22–32)
CREATININE: 0.94 mg/dL (ref 0.44–1.00)
GFR calc Af Amer: >60 mL/min (ref >60)
GFR calc non Af Amer: 57 mL/min — ABNORMAL LOW (ref >60)
Glucose, Bld: 103 mg/dL — ABNORMAL HIGH (ref 65–99)
Potassium: 3.5 mmol/L (ref 3.5–5.1)
SODIUM: 141 mmol/L (ref 135–145)

## 2017-07-14 LAB — CBC
HCT: 37.4 % (ref 36.0–46.0)
Hemoglobin: 13 g/dL (ref 12.0–15.0)
MCH: 30.2 pg (ref 26.0–34.0)
MCHC: 34.8 g/dL (ref 30.0–36.0)
MCV: 86.8 fL (ref 78.0–100.0)
PLATELETS: 230 10*3/uL (ref 150–400)
RBC: 4.31 MIL/uL (ref 3.87–5.11)
RDW: 13.7 % (ref 11.5–15.5)
WBC: 9.7 10*3/uL (ref 4.0–10.5)

## 2017-07-14 NOTE — ED Triage Notes (Signed)
Pt presents with headache that began last night and worsened today; pt also reporting feeling off balance; pt states her headache is around the forehead and to the posterior head like a rubber band; does not usually get headaches; pt reports taking HTN medication as prescribed

## 2017-07-14 NOTE — Telephone Encounter (Signed)
After Hours Emergency Line Call:    Woke up this morning and felt like her blood pressure was high.  Ate breakfast and did not take her BP medication prior to this. She then took her Lotensin 40 MG at 10 AM followed by her afternoon 20 MG.  Overall "felt bad" and CBG's up to 200's.  Checked her BP after taking medication in the afternoon and it was 191/86.  Has remained persistently elevated during the afternoon.  She states her BP machine is new and with her old machine, her blood pressures used to be about 341'P systolic.  Last CBG she checked has normalized and is 122.   Feels unwell overall.  Denies vision changes and headache.  Recommend that she take an additional 40 mg Lotensin now and go to the ED for further evaluation and possible IV antihypertensives.  Pt expressed good understanding and will proceed to the ED.   Lovenia Kim, MD PGY-2 07/14/2017

## 2017-07-15 ENCOUNTER — Telehealth: Payer: Self-pay | Admitting: Internal Medicine

## 2017-07-15 ENCOUNTER — Other Ambulatory Visit: Payer: Self-pay

## 2017-07-15 ENCOUNTER — Emergency Department (HOSPITAL_COMMUNITY)
Admission: EM | Admit: 2017-07-15 | Discharge: 2017-07-15 | Disposition: A | Discharge status: Home / Self Care | Payer: Medicare Other | Source: Home / Self Care | Attending: Emergency Medicine | Admitting: Emergency Medicine

## 2017-07-15 ENCOUNTER — Emergency Department (HOSPITAL_COMMUNITY): Payer: Medicare Other

## 2017-07-15 ENCOUNTER — Observation Stay (HOSPITAL_COMMUNITY)
Admission: EM | Admit: 2017-07-15 | Discharge: 2017-07-17 | Disposition: A | Discharge status: Home / Self Care | Payer: Medicare Other | Attending: Nephrology | Admitting: Nephrology

## 2017-07-15 ENCOUNTER — Encounter (HOSPITAL_COMMUNITY): Payer: Self-pay | Admitting: Emergency Medicine

## 2017-07-15 ENCOUNTER — Encounter (HOSPITAL_COMMUNITY): Payer: Self-pay | Admitting: Radiology

## 2017-07-15 DIAGNOSIS — K219 Gastro-esophageal reflux disease without esophagitis: Secondary | ICD-10-CM

## 2017-07-15 DIAGNOSIS — I10 Essential (primary) hypertension: Secondary | ICD-10-CM | POA: Diagnosis not present

## 2017-07-15 DIAGNOSIS — Z794 Long term (current) use of insulin: Secondary | ICD-10-CM | POA: Diagnosis not present

## 2017-07-15 DIAGNOSIS — R778 Other specified abnormalities of plasma proteins: Secondary | ICD-10-CM | POA: Diagnosis present

## 2017-07-15 DIAGNOSIS — R748 Abnormal levels of other serum enzymes: Secondary | ICD-10-CM

## 2017-07-15 DIAGNOSIS — E119 Type 2 diabetes mellitus without complications: Secondary | ICD-10-CM

## 2017-07-15 DIAGNOSIS — M5416 Radiculopathy, lumbar region: Secondary | ICD-10-CM

## 2017-07-15 DIAGNOSIS — I5032 Chronic diastolic (congestive) heart failure: Secondary | ICD-10-CM | POA: Diagnosis not present

## 2017-07-15 DIAGNOSIS — I160 Hypertensive urgency: Secondary | ICD-10-CM | POA: Diagnosis not present

## 2017-07-15 DIAGNOSIS — E118 Type 2 diabetes mellitus with unspecified complications: Secondary | ICD-10-CM

## 2017-07-15 DIAGNOSIS — R51 Headache: Principal | ICD-10-CM

## 2017-07-15 DIAGNOSIS — R0602 Shortness of breath: Secondary | ICD-10-CM | POA: Diagnosis not present

## 2017-07-15 DIAGNOSIS — E1169 Type 2 diabetes mellitus with other specified complication: Secondary | ICD-10-CM | POA: Diagnosis present

## 2017-07-15 DIAGNOSIS — E785 Hyperlipidemia, unspecified: Secondary | ICD-10-CM | POA: Diagnosis not present

## 2017-07-15 DIAGNOSIS — I161 Hypertensive emergency: Secondary | ICD-10-CM | POA: Diagnosis not present

## 2017-07-15 DIAGNOSIS — R519 Headache, unspecified: Secondary | ICD-10-CM

## 2017-07-15 DIAGNOSIS — M109 Gout, unspecified: Secondary | ICD-10-CM | POA: Diagnosis present

## 2017-07-15 DIAGNOSIS — R7989 Other specified abnormal findings of blood chemistry: Secondary | ICD-10-CM | POA: Diagnosis present

## 2017-07-15 LAB — BASIC METABOLIC PANEL
Anion gap: 12 (ref 5–15)
BUN: 13 mg/dL (ref 6–20)
CO2: 29 mmol/L (ref 22–32)
Calcium: 9.2 mg/dL (ref 8.9–10.3)
Chloride: 99 mmol/L — ABNORMAL LOW (ref 101–111)
Creatinine, Ser: 0.95 mg/dL (ref 0.44–1.00)
GFR calc Af Amer: >60 mL/min (ref >60)
GFR, EST NON AFRICAN AMERICAN: 56 mL/min — AB (ref >60)
Glucose, Bld: 240 mg/dL — ABNORMAL HIGH (ref 65–99)
Potassium: 3.7 mmol/L (ref 3.5–5.1)
Sodium: 140 mmol/L (ref 135–145)

## 2017-07-15 LAB — GLUCOSE, CAPILLARY: Glucose-Capillary: 238 mg/dL — ABNORMAL HIGH (ref 65–99)

## 2017-07-15 LAB — CBC
HCT: 38.8 % (ref 36.0–46.0)
Hemoglobin: 13.3 g/dL (ref 12.0–15.0)
MCH: 30 pg (ref 26.0–34.0)
MCHC: 34.3 g/dL (ref 30.0–36.0)
MCV: 87.4 fL (ref 78.0–100.0)
PLATELETS: 258 10*3/uL (ref 150–400)
RBC: 4.44 MIL/uL (ref 3.87–5.11)
RDW: 13.9 % (ref 11.5–15.5)
WBC: 10.4 10*3/uL (ref 4.0–10.5)

## 2017-07-15 LAB — I-STAT TROPONIN, ED: Troponin i, poc: 0.18 ng/mL (ref 0.00–0.08)

## 2017-07-15 LAB — TROPONIN I: Troponin I: 0.14 ng/mL (ref <0.03)

## 2017-07-15 MED ORDER — ENOXAPARIN SODIUM 40 MG/0.4ML ~~LOC~~ SOLN
40.0000 mg | Freq: Every day | SUBCUTANEOUS | Status: DC
  Administered 2017-07-16 (×2): 40 mg via SUBCUTANEOUS
  Filled 2017-07-15 (×2): qty 0.4

## 2017-07-15 MED ORDER — VITAMIN E 180 MG (400 UNIT) PO CAPS
400.0000 [IU] | ORAL_CAPSULE | Freq: Every day | ORAL | Status: DC
  Administered 2017-07-16 – 2017-07-17 (×2): 400 [IU] via ORAL
  Filled 2017-07-15 (×2): qty 1

## 2017-07-15 MED ORDER — INSULIN ASPART 100 UNIT/ML ~~LOC~~ SOLN
0.0000 [IU] | Freq: Three times a day (TID) | SUBCUTANEOUS | Status: DC
  Administered 2017-07-16: 3 [IU] via SUBCUTANEOUS
  Administered 2017-07-16: 5 [IU] via SUBCUTANEOUS
  Administered 2017-07-16 – 2017-07-17 (×2): 3 [IU] via SUBCUTANEOUS
  Administered 2017-07-17: 5 [IU] via SUBCUTANEOUS

## 2017-07-15 MED ORDER — LORAZEPAM 1 MG PO TABS
1.0000 mg | ORAL_TABLET | Freq: Two times a day (BID) | ORAL | Status: DC | PRN
  Administered 2017-07-16 – 2017-07-17 (×4): 1 mg via ORAL
  Filled 2017-07-15 (×4): qty 1

## 2017-07-15 MED ORDER — ASPIRIN 81 MG PO CHEW
324.0000 mg | CHEWABLE_TABLET | Freq: Every day | ORAL | Status: DC
  Administered 2017-07-16 – 2017-07-17 (×2): 324 mg via ORAL
  Filled 2017-07-15 (×2): qty 4

## 2017-07-15 MED ORDER — PRAVASTATIN SODIUM 40 MG PO TABS
80.0000 mg | ORAL_TABLET | Freq: Every day | ORAL | Status: DC
Start: 2017-07-16 — End: 2017-07-17
  Administered 2017-07-16: 80 mg via ORAL
  Filled 2017-07-15: qty 2

## 2017-07-15 MED ORDER — MORPHINE SULFATE (PF) 4 MG/ML IV SOLN: 2.0000 mg | INTRAVENOUS | Status: DC | PRN

## 2017-07-15 MED ORDER — BENAZEPRIL HCL 40 MG PO TABS
40.0000 mg | ORAL_TABLET | Freq: Once | ORAL | Status: AC
  Administered 2017-07-15: 40 mg via ORAL
  Filled 2017-07-15: qty 1

## 2017-07-15 MED ORDER — HYDRALAZINE HCL 20 MG/ML IJ SOLN
5.0000 mg | INTRAMUSCULAR | Status: DC | PRN
  Administered 2017-07-16 (×3): 5 mg via INTRAVENOUS
  Filled 2017-07-15 (×3): qty 1

## 2017-07-15 MED ORDER — RISAQUAD PO CAPS
1.0000 | ORAL_CAPSULE | Freq: Once | ORAL | Status: AC
  Administered 2017-07-16: 1 via ORAL
  Filled 2017-07-15: qty 1

## 2017-07-15 MED ORDER — NITROGLYCERIN 0.4 MG SL SUBL: 0.4000 mg | SUBLINGUAL_TABLET | SUBLINGUAL | Status: DC | PRN

## 2017-07-15 MED ORDER — ZOLPIDEM TARTRATE 5 MG PO TABS: 5.0000 mg | ORAL_TABLET | Freq: Every evening | ORAL | Status: DC | PRN

## 2017-07-15 MED ORDER — INSULIN GLARGINE 100 UNIT/ML ~~LOC~~ SOLN
50.0000 [IU] | Freq: Every day | SUBCUTANEOUS | Status: DC
  Administered 2017-07-16: 50 [IU] via SUBCUTANEOUS
  Filled 2017-07-15: qty 0.5

## 2017-07-15 MED ORDER — ALLOPURINOL 100 MG PO TABS
200.0000 mg | ORAL_TABLET | Freq: Every day | ORAL | Status: DC
  Administered 2017-07-16 – 2017-07-17 (×2): 200 mg via ORAL
  Filled 2017-07-15 (×2): qty 2

## 2017-07-15 MED ORDER — SPIRONOLACTONE 12.5 MG HALF TABLET
12.5000 mg | ORAL_TABLET | Freq: Every day | ORAL | Status: DC
  Administered 2017-07-16 – 2017-07-17 (×2): 12.5 mg via ORAL
  Filled 2017-07-15 (×2): qty 1

## 2017-07-15 MED ORDER — PANTOPRAZOLE SODIUM 40 MG PO TBEC
40.0000 mg | DELAYED_RELEASE_TABLET | Freq: Every day | ORAL | Status: DC
  Administered 2017-07-16 – 2017-07-17 (×2): 40 mg via ORAL
  Filled 2017-07-15 (×2): qty 1

## 2017-07-15 MED ORDER — BACLOFEN 10 MG PO TABS: 10.0000 mg | ORAL_TABLET | Freq: Three times a day (TID) | ORAL | Status: DC | PRN

## 2017-07-15 MED ORDER — TORSEMIDE 20 MG PO TABS
20.0000 mg | ORAL_TABLET | Freq: Every day | ORAL | Status: DC
  Administered 2017-07-16 – 2017-07-17 (×2): 20 mg via ORAL
  Filled 2017-07-15 (×2): qty 1

## 2017-07-15 MED ORDER — CARVEDILOL 12.5 MG PO TABS
6.2500 mg | ORAL_TABLET | Freq: Once | ORAL | Status: AC
  Administered 2017-07-15: 6.25 mg via ORAL
  Filled 2017-07-15: qty 1

## 2017-07-15 MED ORDER — ONDANSETRON HCL 4 MG/2ML IJ SOLN: 4.0000 mg | Freq: Four times a day (QID) | INTRAMUSCULAR | Status: DC | PRN

## 2017-07-15 MED ORDER — HYDRALAZINE HCL 20 MG/ML IJ SOLN
20.0000 mg | Freq: Once | INTRAMUSCULAR | Status: AC
  Administered 2017-07-15: 20 mg via INTRAVENOUS
  Filled 2017-07-15: qty 1

## 2017-07-15 MED ORDER — ACETAMINOPHEN 325 MG PO TABS
650.0000 mg | ORAL_TABLET | ORAL | Status: DC | PRN
  Administered 2017-07-16 (×2): 650 mg via ORAL
  Filled 2017-07-15 (×2): qty 2

## 2017-07-15 MED ORDER — ALPRAZOLAM 0.25 MG PO TABS: 0.2500 mg | ORAL_TABLET | Freq: Two times a day (BID) | ORAL | Status: DC | PRN

## 2017-07-15 MED ORDER — ADULT MULTIVITAMIN W/MINERALS CH
1.0000 | ORAL_TABLET | Freq: Every day | ORAL | Status: DC
  Administered 2017-07-16 – 2017-07-17 (×2): 1 via ORAL
  Filled 2017-07-15 (×2): qty 1

## 2017-07-15 MED ORDER — LORAZEPAM 2 MG/ML IJ SOLN
0.5000 mg | Freq: Once | INTRAMUSCULAR | Status: AC
Start: 2017-07-15 — End: 2017-07-15
  Administered 2017-07-15: 0.5 mg via INTRAVENOUS
  Filled 2017-07-15: qty 1

## 2017-07-15 MED ORDER — AMLODIPINE BESYLATE 10 MG PO TABS
10.0000 mg | ORAL_TABLET | Freq: Every day | ORAL | Status: DC
  Administered 2017-07-16 – 2017-07-17 (×3): 10 mg via ORAL
  Filled 2017-07-15 (×3): qty 1

## 2017-07-15 MED ORDER — HYDRALAZINE HCL 20 MG/ML IJ SOLN
10.0000 mg | Freq: Once | INTRAMUSCULAR | Status: AC
  Administered 2017-07-15: 10 mg via INTRAVENOUS
  Filled 2017-07-15: qty 1

## 2017-07-15 MED ORDER — CARVEDILOL 12.5 MG PO TABS
6.2500 mg | ORAL_TABLET | Freq: Two times a day (BID) | ORAL | Status: DC
  Administered 2017-07-16 – 2017-07-17 (×4): 6.25 mg via ORAL
  Filled 2017-07-15 (×4): qty 1

## 2017-07-15 MED ORDER — FAMOTIDINE 20 MG PO TABS
20.0000 mg | ORAL_TABLET | Freq: Two times a day (BID) | ORAL | Status: DC
  Administered 2017-07-16 – 2017-07-17 (×4): 20 mg via ORAL
  Filled 2017-07-15 (×4): qty 1

## 2017-07-15 NOTE — ED Provider Notes (Signed)
Emergency Department Provider Note   I have reviewed the triage vital signs and the nursing notes.   HISTORY  Chief Complaint Hypertension   HPI Rebecca Walter is a 76 y.o. female with PMH of DM, GERD, HLD, and HTN presents to the emergency department for evaluation of continued elevated blood pressures, intermittent dyspnea, generalized weakness, and intermittent headache. The patient reports symptom onset 3 days ago. She was seen in the emergency department early this morning with chief complaint of elevated blood pressure and headache. CT scan at that time was negative. She denied any chest pain or shortness of breath during that visit. She returned home with plan to follow up with her primary care physician who recently increased the dose of her ACE inhibitor. Early this morning she had some severe nausea that resolved with Zantac at home. She notes her blood pressure continued to be elevated at home and she would have intermittent generalized weakness and dyspnea. Denies any chest pain, worsening nausea, back pain, jaw pain. No near-syncope or vision changes. She returned to the emergency department with continued elevated blood pressures. At the time of my evaluation she is completely symptom free.   Past Medical History:  Diagnosis Date  . Anxiety   . Arthritis   . Diabetes mellitus   . GERD (gastroesophageal reflux disease)   . Gout   . Hyperlipidemia   . Hypertension   . Osteopenia     Patient Active Problem List   Diagnosis Date Noted  . Hypertensive emergency 07/15/2017  . Elevated troponin 07/15/2017  . Chronic diastolic CHF (congestive heart failure) ([place]) 07/15/2017  . Yeast vaginitis 01/25/2017  . Lumbar back pain with radiculopathy affecting right lower extremity 10/29/2016  . Elevated BP 08/22/2015  . Difficulty urinating 02/06/2015  . Right shoulder pain 04/25/2014  . Allergic rhinitis 03/22/2011  . INSOMNIA 09/16/2009  . GASTROPARESIS 08/12/2009  .  Anxiety 10/18/2008  . SYSTOLIC MURMUR 42/35/3614  . DYSKINESIA, ESOPHAGUS 09/13/2007  . CARCINOMA, BASAL CELL 05/26/2007  . Diabetes mellitus, type II ([place]) 02/16/2007  . HLD (hyperlipidemia) 02/16/2007  . OBESITY, NOS 02/16/2007  . HYPERTENSION, BENIGN SYSTEMIC 02/16/2007  . GASTROESOPHAGEAL REFLUX, NO ESOPHAGITIS 02/16/2007  . OSTEOARTHRITIS, MULTI SITES 02/16/2007  . OSTEOPENIA 02/16/2007  . Gout 02/16/2007    Past Surgical History:  Procedure Laterality Date  . KIDNEY SURGERY    . KNEE SURGERY    . MENISECTOMY    . WRIST SURGERY      Current Outpatient Rx  . Order #: [id number] Class: Normal  . Order #: [id number] Class: Normal  . Order #: [id number] Class: No Print  . Order #: [id number] Class: Normal  . Order #: [id number] Class: Print  . Order #: [id number] Class: Normal  . Order #: [id number] Class: Historical Med  . Order #: [id number] Class: Historical Med  . Order #: [id number] Class: Historical Med  . Order #: [id number] Class: Historical Med  . Order #: [id number] Class: Normal  . Order #: [id number] Class: Historical Med  . Order #: [id number] Class: Normal  . Order #: [id number] Class: Normal  . Order #: [id number] Class: Normal  . Order #: [id number] Class: Normal  . Order #: [id number] Class: Normal  . Order #: [id number] Class: Normal  . Order #: [id number] Class: Normal  . Order #: [id number] Class: Normal    Allergies Hctz [hydrochlorothiazide]; Victoza [liraglutide]; Acyclovir and related; and Beta adrenergic blockers  Family History  Problem Relation Age of Onset  . Stroke Mother   . Heart disease Father   . Stroke Father   . Hypertension Father   .  Cancer Sister   . Cancer Brother   . Heart disease Brother   . Diabetes Maternal Uncle     Social History Social History  Substance Use Topics  . Smoking status: Never Smoker  . Smokeless tobacco: Never Used  . Alcohol use No    Review of Systems  Constitutional: No fever/chills Eyes: No visual changes. ENT: No sore  throat. Cardiovascular: Denies chest pain. Respiratory: Positive intermittent shortness of breath. Gastrointestinal: No abdominal pain. Positive nausea, no vomiting.  No diarrhea.  No constipation. Genitourinary: Negative for dysuria. Musculoskeletal: Negative for back pain. Skin: Negative for rash. Neurological: Negative for focal weakness or numbness. Positive intermittent HA.   10-point ROS otherwise negative.  ____________________________________________   PHYSICAL EXAM:  VITAL SIGNS: ED Triage Vitals  Enc Vitals Group     BP 07/15/17 1940 (!) 187/84     Pulse Rate 07/15/17 1940 83     Resp 07/15/17 1940 20     Temp 07/15/17 1940 98.1 F (36.7 C)     Temp Source 07/15/17 1940 Oral     SpO2 07/15/17 1940 96 %     Weight 07/15/17 1945 200 lb (90.7 kg)     Height 07/15/17 1945 5' (1.524 m)     Pain Score 07/15/17 1945 0   Constitutional: Alert and oriented. Well appearing and in no acute distress. Eyes: Conjunctivae are normal.  Head: Atraumatic. Nose: No congestion/rhinnorhea. Mouth/Throat: Mucous membranes are moist.   Neck: No stridor. Cardiovascular: Normal rate, regular rhythm. Good peripheral circulation. Positive faint systolic murmur.  Respiratory: Normal respiratory effort.  No retractions. Lungs CTAB. Gastrointestinal: Soft and nontender. No distention.  Musculoskeletal: No lower extremity tenderness nor edema. No gross deformities of extremities. Neurologic:  Normal speech and language. No gross focal neurologic deficits are appreciated. Normal CN exam 2-12.  Skin:  Skin is warm, dry and intact. No rash noted. Psychiatric: Mood and affect are normal. Speech and behavior are normal.  ____________________________________________   LABS (all labs ordered are listed, but only abnormal results are displayed)  Labs Reviewed  BASIC METABOLIC PANEL - Abnormal; Notable for the following:       Result Value   Chloride 99 (*)    Glucose, Bld 240 (*)    GFR calc  non Af Amer 56 (*)    All other components within normal limits  I-STAT TROPONIN, ED - Abnormal; Notable for the following:    Troponin i, poc 0.18 (*)    All other components within normal limits  CBC  TROPONIN I   ____________________________________________  EKG  Rate: 83 PR: 191 QTc:453 Sinus rhythm. ST depressions inferior and lateral. No ST elevation. ST segments unchanged from prior tracings. No STEMI.    ____________________________________________  RADIOLOGY  Dg Chest 2 View  Result Date: 07/15/2017 CLINICAL DATA:  High blood pressure since yesterday. Shortness of breath. EXAM: CHEST  2 VIEW COMPARISON:  08/06/2012 FINDINGS: Borderline heart size with normal pulmonary vascularity. Linear fibrosis in the left lung base similar previous study. No airspace disease or consolidation. No blunting of costophrenic angles. No pneumothorax. Mediastinal contours appear intact. Calcification of the aorta. Degenerative changes in the spine and shoulders. IMPRESSION: No evidence of active pulmonary disease.  Aortic atherosclerosis. Electronically Signed   By: Lucienne Capers M.D.   On: 07/15/2017 20:41   Ct Head Wo Contrast  Result Date: 07/15/2017 CLINICAL DATA:  Headache starting last night, worse today. History of hypertension and diabetes. EXAM: CT HEAD WITHOUT CONTRAST TECHNIQUE: Contiguous axial  images were obtained from the base of the skull through the vertex without intravenous contrast. COMPARISON:  None. FINDINGS: Brain: No evidence of acute infarction, hemorrhage, hydrocephalus, extra-axial collection or mass lesion/mass effect. Vascular: No hyperdense vessel or unexpected calcification. Skull: Normal. Negative for fracture or focal lesion. Sinuses/Orbits: No acute finding. Other: None. IMPRESSION: No acute intracranial abnormalities. Electronically Signed   By: Lucienne Capers M.D.   On: 07/15/2017 00:22     ____________________________________________   PROCEDURES  Procedure(s) performed:   Procedures  CRITICAL CARE Performed by: Margette Fast Total critical care time: 35 minutes Critical care time was exclusive of separately billable procedures and treating other patients. Critical care was necessary to treat or prevent imminent or life-threatening deterioration. Critical care was time spent personally by me on the following activities: development of treatment plan with patient and/or surrogate as well as nursing, discussions with consultants, evaluation of patient's response to treatment, examination of patient, obtaining history from patient or surrogate, ordering and performing treatments and interventions, ordering and review of laboratory studies, ordering and review of radiographic studies, pulse oximetry and re-evaluation of patient's condition.  Nanda Quinton, MD Emergency Medicine  ____________________________________________   INITIAL IMPRESSION / ASSESSMENT AND PLAN / ED COURSE  Pertinent labs & imaging results that were available during my care of the patient were reviewed by me and considered in my medical decision making (see chart for details).  Patient presents to the emergency department for evaluation of intermittent dyspnea, headache, generalized weakness over the past several days. She was seen in the emergency department earlier today with primarily headache symptoms. No focal neurological deficits then or now. At that time was negative. She returns with continued elevated blood pressures, severe nausea this morning, with intermittent SOB. No symptoms at this time. Concern for HTN emergency. Starting with IV Hydralazine with allergic reaction listed to Beta Blocker.   Repeat EKG unremarkable.   Discussed patient's case with Hospitalist, Dr. Blaine Hamper. Patient and family (if present) updated with plan. Care transferred to Hospitalist service.  I reviewed all nursing  notes, vitals, pertinent old records, EKGs, labs, imaging (as available).  ____________________________________________  FINAL CLINICAL IMPRESSION(S) / ED DIAGNOSES  Final diagnoses:  Hypertensive emergency     MEDICATIONS GIVEN DURING THIS VISIT:  Medications  hydrALAZINE (APRESOLINE) injection 10 mg (10 mg Intravenous Given 07/15/17 2140)     NEW OUTPATIENT MEDICATIONS STARTED DURING THIS VISIT:  None   Note:  This document was prepared using Dragon voice recognition software and may include unintentional dictation errors.  Nanda Quinton, MD Emergency Medicine    Samah Lapiana, [facility], MD 07/15/17 2206

## 2017-07-15 NOTE — ED Notes (Signed)
Notified nurse first results from istat troponin test.

## 2017-07-15 NOTE — ED Notes (Signed)
Pt reports BP being high at home. Just seen here last night for same.  Denies CP, HA. Does endorse SOB and dizziness.

## 2017-07-15 NOTE — ED Notes (Signed)
Sent down add on label for BNP,  Troponin I currently in process.

## 2017-07-15 NOTE — Telephone Encounter (Signed)
Spoke to pt. She hasn't been home long from the hospital. Can not come in to the office today. She made an appt for Tuesday. Will keep an eye on her BP and will call the on call Dr. If needed this weekend. Pt just wanted Dr. Juleen China to know what was going on incase she had any more ideas on what pt should do. Ottis Stain, CMA

## 2017-07-15 NOTE — ED Notes (Signed)
Notified of Positive Trop.

## 2017-07-15 NOTE — ED Notes (Signed)
Critical Results--Dr. Laverta Baltimore notified of Trop 0.18

## 2017-07-15 NOTE — ED Notes (Signed)
Dr. Betsey Holiday explained plan of care to pt.

## 2017-07-15 NOTE — Telephone Encounter (Signed)
Pt is calling because she went to the ER because her BP was so high. She stayed there all night and they finally had her BP under control. She said its very low and she doesn't feel right. She would like to speak to a nurse and the doctor to see what she needs to do . Please call patient is very concerned. jw

## 2017-07-15 NOTE — Telephone Encounter (Signed)
Please call patient to have her make a SDA today for follow up from ER as well as new complaint of BP being low.   Phill Myron, D.O. 07/15/2017, 9:22 AM PGY-3, [place]

## 2017-07-15 NOTE — H&P (Signed)
History and Physical    Rebecca Walter HYW:[id number] DOB: 1941/03/29 DOA: 07/15/2017  Referring MD/NP/PA:   PCP: Nicolette Bang, DO   Patient coming from:  The patient is coming from home.  At baseline, pt is independent for most of ADL   Chief Complaint: elevated blood pressure, headache, shortness breath, dry cough  HPI: Rebecca Walter is a 76 y.o. female with medical history significant of hypertension, hyperlipidemia, diabetes mellitus, GERD, gout, anxiety, CHF, who presents with elevated blood pressure, headache, shortness of breath and dry cough.  Patient states that she has been having intermittent headache and intermittent shortness of breath in the past 3 days. She also hasgeneralized weakness, sometimes with dizziness. Denies chest pain, unilateral weakness, numbness or tingliness in extremities. No vision change or hearing loss. No fever or chills. She has acid reflux symptoms, mild nausea, but no vomiting, diarrhea or abdominal pain. She was seen in the emergency department early this morning with chief complaint of elevated blood pressure and headache. CT scan at that time was negative. She returned home with plan to follow up with her primary care physician who recently increased the dose of her Lotensin. Pt states that she has been having dry cough ever since she started taking Lotensin. No symptoms of UTI. Pt comes back to ED since her blood pressure has been elevated persistently. Her headache and dizziness have resolved.   ED Course: pt was found to have positive troponin 0.18, WBC 10.4, electrolytes renal function okay, temperature normal, blood pressure 260/90-->189/73, no tachycardia, oxygen saturation 90-96% on room air, negative chest x-ray. Patient is placed on telemetry bed for observation.  Review of Systems:   General: no fevers, chills, no changes in body weight, has poor appetite, has fatigue HEENT: no blurry vision, hearing changes or sore  throat Respiratory: has dyspnea, coughing, no wheezing CV: no chest pain, no palpitations GI: has nausea, no vomiting, abdominal pain, diarrhea, constipation GU: no dysuria, burning on urination, increased urinary frequency, hematuria  Ext: has mild leg edema Neuro: no unilateral weakness, numbness, or tingling, no vision change or hearing loss Skin: no rash, no skin tear. MSK: No muscle spasm, no deformity, no limitation of range of movement in spin Heme: No easy bruising.  Travel history: No recent long distant travel.  Allergy:  Allergies  Allergen Reactions  . Hctz [Hydrochlorothiazide] Other (See Comments)    Uric acid elevation and gout.     Donna Bernard [Liraglutide] Other (See Comments)    Tongue Glossitus  . Lotensin [Benazepril]     Dry cough   . Acyclovir And Related Diarrhea  . Beta Adrenergic Blockers Other (See Comments)    Occurred with metoprolol  REACTION: coughing    Past Medical History:  Diagnosis Date  . Anxiety   . Arthritis   . Diabetes mellitus   . GERD (gastroesophageal reflux disease)   . Gout   . Hyperlipidemia   . Hypertension   . Osteopenia     Past Surgical History:  Procedure Laterality Date  . KIDNEY SURGERY    . KNEE SURGERY    . MENISECTOMY    . WRIST SURGERY      Social History:  reports that she has never smoked. She has never used smokeless tobacco. She reports that she does not drink alcohol or use drugs.  Family History:  Family History  Problem Relation Age of Onset  . Stroke Mother   . Heart disease Father   . Stroke Father   .  Hypertension Father   . Cancer Sister   . Cancer Brother   . Heart disease Brother   . Diabetes Maternal Uncle      Prior to Admission medications   Medication Sig Start Date End Date Taking? Authorizing Provider  benazepril (LOTENSIN) 20 MG tablet Take 1 tablet (20 mg total) by mouth daily. In the evening. Patient taking differently: Take 20 mg by mouth every evening.  07/11/17  Yes  Nicolette Bang, DO  benazepril (LOTENSIN) 40 MG tablet Take 1 tablet (40 mg total) by mouth daily. In the morning. 07/11/17  Yes Nicolette Bang, DO  insulin aspart (NOVOLOG FLEXPEN) 100 UNIT/ML FlexPen - Before breakfast: Take 30 units if your blood sugar is 80-180, if it is > 180 take 35 units - Before lunch: Take 15 units if your blood sugar is 80-180, if it is > 180 take 20 units - Before dinner: Take 20 units if your blood sugar is 80-180, if it is > 180 take 25 units 03/24/17  Yes Hensel, Jamal Collin, MD  LANTUS SOLOSTAR 100 UNIT/ML Solostar Pen INJECT 70 UNITS INTO THE SKIN EVERY MORNING. 06/01/17  Yes Nicolette Bang, DO  LORazepam (ATIVAN) 1 MG tablet Take 1 tablet (1 mg total) by mouth 2 (two) times daily as needed for anxiety. 06/21/17  Yes Nicolette Bang, DO  metFORMIN (GLUCOPHAGE-XR) 500 MG 24 hr tablet TAKE 1 TABLET BY MOUTH 2 TIMES A DAY BEFORE MEALS 10/05/16  Yes Nicolette Bang, DO  Multiple Vitamin (MULTIVITAMIN) tablet Take 1 tablet by mouth daily.     Yes [provider]  pantoprazole (PROTONIX) 40 MG tablet Take 40 mg by mouth daily.   Yes [provider]  Probiotic Product (PROBIOTIC PEARLS) CAPS Take 1 capsule by mouth once.    Yes [provider]  ranitidine (ZANTAC) 150 MG tablet Take 150 mg by mouth 2 (two) times daily.   Yes [provider]  torsemide (DEMADEX) 20 MG tablet Take 1 tablet (20 mg total) by mouth daily. 06/04/17  Yes Nicolette Bang, DO  vitamin E (NATURAL MIXED TOCOPHEROLS) 400 UNIT capsule Take 400 Units by mouth daily.    Yes [provider]  allopurinol (ZYLOPRIM) 100 MG tablet TAKE 2 TABLETS BY MOUTH EVERY DAY 02/02/17   Nicolette Bang, DO  aspirin 81 MG chewable tablet Chew 1 tablet (81 mg total) by mouth daily. 03/22/11   Cletus Gash, MD  B-D ULTRAFINE III SHORT PEN 31G X 8 MM MISC USE AS DIRECTED 05/23/17   Nicolette Bang, DO   baclofen (LIORESAL) 10 MG tablet TAKE 1 TABLET BY MOUTH 3 TIMES A DAY AS NEEDED FOR MUSCLE SPASMS 06/21/17   Nicolette Bang, DO  carvedilol (COREG) 6.25 MG tablet TAKE 1 TABLET (6.25 MG TOTAL) BY MOUTH 2 (TWO) TIMES DAILY WITH A MEAL. Patient not taking: Reported on 07/15/2017 06/23/17   Nicolette Bang, DO  glucose blood (ACCU-CHEK AVIVA PLUS) test strip CHECK BLOOD SUGAR FIRST  THING IN THE MORNING BEFORE EATING, AFTER LUNCH, AND  AFTER DINNER TOTAL 3 TIMES  DAILY. ICD 10-code: E11.9 04/27/17   Lupita Dawn, MD  lovastatin (MEVACOR) 40 MG tablet TAKE 2 TABLETS BY MOUTH EVERY DAY Patient taking differently: take 40 mg by mouth daily 10/20/16   Nicolette Bang, DO  spironolactone (ALDACTONE) 25 MG tablet TAKE 0.5 TABLETS (12.5 MG TOTAL) BY MOUTH DAILY. 06/27/17   Nicolette Bang, DO    Physical Exam: Vitals:  07/15/17 2130 07/15/17 2145 07/15/17 2200 07/15/17 2215  BP: (!) 193/74 (!) 193/70 (!) 190/77 (!) 192/65  Pulse: 80 85 90 88  Resp: (!) 26 (!) 23 20 (!) 27  Temp:      TempSrc:      SpO2: 95% 96% 95% 94%  Weight:      Height:       General: Not in acute distress HEENT:       Eyes: PERRL, EOMI, no scleral icterus.       ENT: No discharge from the ears and nose, no pharynx injection, no tonsillar enlargement.        Neck: No JVD, no bruit, no mass felt. Heme: No neck lymph node enlargement. Cardiac: F6/E3, RRR, 2/6 systolic murmurs, No gallops or rubs. Respiratory:  No rales, wheezing, rhonchi or rubs. GI: Soft, nondistended, nontender, no rebound pain, no organomegaly, BS present. GU: No hematuria Ext: trace leg edema bilaterally. 2+DP/PT pulse bilaterally. Musculoskeletal: No joint deformities, No joint redness or warmth, no limitation of ROM in spin. Skin: No rashes.  Neuro: Alert, oriented X3, cranial nerves II-XII grossly intact, moves all extremities normally. Muscle strength 5/5 in all extremities, sensation to light touch intact.  Brachial reflex 2+ bilaterally. Negative Babinski's sign. Normal finger to nose test. Psych: Patient is not psychotic, no suicidal or hemocidal ideation.  Labs on Admission: I have personally reviewed following labs and imaging studies  CBC:  Recent Labs Lab 07/14/17 2244 07/15/17 2000  WBC 9.7 10.4  HGB 13.0 13.3  HCT 37.4 38.8  MCV 86.8 87.4  PLT 230 329   Basic Metabolic Panel:  Recent Labs Lab 07/14/17 2244 07/15/17 2000  NA 141 140  K 3.5 3.7  CL 102 99*  CO2 30 29  GLUCOSE 103* 240*  BUN 15 13  CREATININE 0.94 0.95  CALCIUM 9.3 9.2   GFR: Estimated Creatinine Clearance: 49.8 mL/min (by C-G formula based on SCr of 0.95 mg/dL). Liver Function Tests: No results for input(s): AST, ALT, ALKPHOS, BILITOT, PROT, ALBUMIN in the last 168 hours. No results for input(s): LIPASE, AMYLASE in the last 168 hours. No results for input(s): AMMONIA in the last 168 hours. Coagulation Profile: No results for input(s): INR, PROTIME in the last 168 hours. Cardiac Enzymes:  Recent Labs Lab 07/15/17 2138  TROPONINI 0.14*   BNP (last 3 results) No results for input(s): PROBNP in the last 8760 hours. HbA1C: No results for input(s): HGBA1C in the last 72 hours. CBG: No results for input(s): GLUCAP in the last 168 hours. Lipid Profile: No results for input(s): CHOL, HDL, LDLCALC, TRIG, CHOLHDL, LDLDIRECT in the last 72 hours. Thyroid Function Tests: No results for input(s): TSH, T4TOTAL, FREET4, T3FREE, THYROIDAB in the last 72 hours. Anemia Panel: No results for input(s): VITAMINB12, FOLATE, FERRITIN, TIBC, IRON, RETICCTPCT in the last 72 hours. Urine analysis:    Component Value Date/Time   COLORURINE STRAW (A) 07/14/2017 2247   APPEARANCEUR CLEAR 07/14/2017 2247   LABSPEC 1.006 07/14/2017 2247   PHURINE 7.0 07/14/2017 2247   GLUCOSEU NEGATIVE 07/14/2017 2247   HGBUR NEGATIVE 07/14/2017 2247   HGBUR negative 07/28/2010 1028   BILIRUBINUR NEGATIVE 07/14/2017 2247    BILIRUBINUR NEG 01/14/2017 1403   KETONESUR NEGATIVE 07/14/2017 2247   PROTEINUR 100 (A) 07/14/2017 2247   UROBILINOGEN 0.2 01/14/2017 1403   UROBILINOGEN 0.2 08/06/2012 1913   NITRITE NEGATIVE 07/14/2017 2247   LEUKOCYTESUR TRACE (A) 07/14/2017 2247   Sepsis Labs: @LABRCNTIP (procalcitonin:4,lacticidven:4) )No results found for this or  any previous visit (from the past 240 hour(s)).   Radiological Exams on Admission: Dg Chest 2 View  Result Date: 07/15/2017 CLINICAL DATA:  High blood pressure since yesterday. Shortness of breath. EXAM: CHEST  2 VIEW COMPARISON:  08/06/2012 FINDINGS: Borderline heart size with normal pulmonary vascularity. Linear fibrosis in the left lung base similar previous study. No airspace disease or consolidation. No blunting of costophrenic angles. No pneumothorax. Mediastinal contours appear intact. Calcification of the aorta. Degenerative changes in the spine and shoulders. IMPRESSION: No evidence of active pulmonary disease.  Aortic atherosclerosis. Electronically Signed   By: Lucienne Capers M.D.   On: 07/15/2017 20:41   Ct Head Wo Contrast  Result Date: 07/15/2017 CLINICAL DATA:  Headache starting last night, worse today. History of hypertension and diabetes. EXAM: CT HEAD WITHOUT CONTRAST TECHNIQUE: Contiguous axial images were obtained from the base of the skull through the vertex without intravenous contrast. COMPARISON:  None. FINDINGS: Brain: No evidence of acute infarction, hemorrhage, hydrocephalus, extra-axial collection or mass lesion/mass effect. Vascular: No hyperdense vessel or unexpected calcification. Skull: Normal. Negative for fracture or focal lesion. Sinuses/Orbits: No acute finding. Other: None. IMPRESSION: No acute intracranial abnormalities. Electronically Signed   By: Lucienne Capers M.D.   On: 07/15/2017 00:22     EKG: Independently reviewed. Sinus rhythm, QTC 444, LAD, anteroseptal infarction pattern, nonspecific T-wave  change.   Assessment/Plan Principal Problem:   Hypertensive emergency Active Problems:   Diabetes mellitus, type II (HCC)   HLD (hyperlipidemia)   HYPERTENSION, BENIGN SYSTEMIC   GASTROESOPHAGEAL REFLUX, NO ESOPHAGITIS   Gout   Elevated troponin   Chronic diastolic CHF (congestive heart failure) ([place])   Hypertensive emergency:  blood pressure 260/90-->189/73 with positive trop, consistent with hypertensive emergency. Pt has dry cough ever since starting taking Lotensin. Patient had intermittent headache and dizziness, which have resolved currently. No focal neurological findings on physical examination. CT head was negative. Mental status normal.  -Will place on tele bed for obs -IV hydralazine when necessary -Continue home Coreg, torsemide, spironolactone -Switch Lotensin to amlodipine 10 mg daily due to dry cough -goal of bp reduction is by about 25 to 30% tonight, at SBP 160 to 180 mmHg.  Elevated troponin: trop 0.18. No CP, but has mild SOB. Likely due to demand ischemia secondary to hypertensive emergency. - cycle CE q6 x3 and repeat EKG in the am  - prn Nitroglycerin, Morphine, and aspirin, pravastatin - Risk factor stratification: will check FLP, UDS and A1C  - 2d echo - Inpatient non-urgent consult order was put in Epic and e-mail to [employer] was sent out.  DM-II: Last A1c 7.2, fairly controled. Patient is taking metformin, NovoLog, Lantus at home -will decrease Lantus dose from 70-50 units daily  -SSI  HLD: - Pravastatin  GERD: -Protonix  Gout: -continue home allopurinol   Chronic diastolic CHF (congestive heart failure) ([place]): 2-D echo on 04/30/15 showed EF 60-70% with grade 1 diastolic dysfunction. Patient has mild leg edema, but no JVD. CHF is compensated. -Continue Coreg, aspirin, torsemide and spironolactone.  DVT ppx:  SQ Lovenox Code Status: Full code Family Communication: Yes, patient's son at bed side Disposition Plan:  Anticipate discharge back  to previous home environment Consults called:  none Admission status: Obs / tele        Date of Service 07/15/2017    Ivor Costa [facility] Pager [phone]  If 7PM-7AM, please contact night-coverage [email or web address] Password [facility] 07/15/2017, 10:35 PM

## 2017-07-15 NOTE — ED Notes (Signed)
Report attempted 

## 2017-07-15 NOTE — ED Provider Notes (Signed)
[facility] DEPT Provider Note   CSN: [id number] Arrival date & time: 07/14/17  2234     History   Chief Complaint Chief Complaint  Patient presents with  . Headache  . Hypertension    HPI Rebecca Walter is a 76 y.o. female.  Patient presents to the ER for evaluation of headache. Patient has had a headache for 1 day. She reports that it started as a vertex and frontal throbbing that she has had previously with elevated blood pressure. She did take her blood pressure and it was elevated at home. Headache became more of a left-sided behind the ear headache and down into her neck. She has had some dizziness associated with the symptoms. Her headache, however, has improved, now just experiencing the neck pain. She has not noticed any vision change. There is no chest pain, shortness of breath, palpitations, syncope.      Past Medical History:  Diagnosis Date  . Anxiety   . Arthritis   . Diabetes mellitus   . GERD (gastroesophageal reflux disease)   . Gout   . Hyperlipidemia   . Hypertension   . Osteopenia     Patient Active Problem List   Diagnosis Date Noted  . Yeast vaginitis 01/25/2017  . Lumbar back pain with radiculopathy affecting right lower extremity 10/29/2016  . Elevated BP 08/22/2015  . Difficulty urinating 02/06/2015  . Right shoulder pain 04/25/2014  . Allergic rhinitis 03/22/2011  . INSOMNIA 09/16/2009  . GASTROPARESIS 08/12/2009  . Anxiety 10/18/2008  . SYSTOLIC MURMUR 00/93/8182  . DYSKINESIA, ESOPHAGUS 09/13/2007  . CARCINOMA, BASAL CELL 05/26/2007  . Diabetes mellitus, type II ([place]) 02/16/2007  . HYPERLIPIDEMIA 02/16/2007  . OBESITY, NOS 02/16/2007  . HYPERTENSION, BENIGN SYSTEMIC 02/16/2007  . GASTROESOPHAGEAL REFLUX, NO ESOPHAGITIS 02/16/2007  . OSTEOARTHRITIS, MULTI SITES 02/16/2007  . OSTEOPENIA 02/16/2007  . Gout 02/16/2007    Past Surgical History:  Procedure Laterality Date  . KIDNEY SURGERY    . KNEE SURGERY    . MENISECTOMY      . WRIST SURGERY      OB History    No data available       Home Medications    Prior to Admission medications   Medication Sig Start Date End Date Taking? Authorizing Provider  allopurinol (ZYLOPRIM) 100 MG tablet TAKE 2 TABLETS BY MOUTH EVERY DAY 02/02/17  Yes Nicolette Bang, DO  aspirin 81 MG chewable tablet Chew 1 tablet (81 mg total) by mouth daily. 03/22/11  Yes Cletus Gash, MD  baclofen (LIORESAL) 10 MG tablet TAKE 1 TABLET BY MOUTH 3 TIMES A DAY AS NEEDED FOR MUSCLE SPASMS 06/21/17  Yes Nicolette Bang, DO  benazepril (LOTENSIN) 20 MG tablet Take 1 tablet (20 mg total) by mouth daily. In the evening. Patient taking differently: Take 20 mg by mouth every evening.  07/11/17  Yes Nicolette Bang, DO  benazepril (LOTENSIN) 40 MG tablet Take 1 tablet (40 mg total) by mouth daily. In the morning. 07/11/17  Yes Nicolette Bang, DO  insulin aspart (NOVOLOG FLEXPEN) 100 UNIT/ML FlexPen - Before breakfast: Take 30 units if your blood sugar is 80-180, if it is > 180 take 35 units - Before lunch: Take 15 units if your blood sugar is 80-180, if it is > 180 take 20 units - Before dinner: Take 20 units if your blood sugar is 80-180, if it is > 180 take 25 units 03/24/17  Yes Hensel, Jamal Collin, MD  LANTUS SOLOSTAR 100 UNIT/ML  Solostar Pen INJECT 70 UNITS INTO THE SKIN EVERY MORNING. 06/01/17  Yes Nicolette Bang, DO  LORazepam (ATIVAN) 1 MG tablet Take 1 tablet (1 mg total) by mouth 2 (two) times daily as needed for anxiety. 06/21/17  Yes Nicolette Bang, DO  lovastatin (MEVACOR) 40 MG tablet TAKE 2 TABLETS BY MOUTH EVERY DAY Patient taking differently: take 40 mg by mouth daily 10/20/16  Yes Nicolette Bang, DO  metFORMIN (GLUCOPHAGE-XR) 500 MG 24 hr tablet TAKE 1 TABLET BY MOUTH 2 TIMES A DAY BEFORE MEALS 10/05/16  Yes Nicolette Bang, DO  Multiple Vitamin (MULTIVITAMIN) tablet Take 1 tablet by mouth daily.     Yes  [provider]  Probiotic Product (PROBIOTIC PEARLS) CAPS Take 1 capsule by mouth once.    Yes [provider]  spironolactone (ALDACTONE) 25 MG tablet TAKE 0.5 TABLETS (12.5 MG TOTAL) BY MOUTH DAILY. 06/27/17  Yes Nicolette Bang, DO  torsemide (DEMADEX) 20 MG tablet Take 1 tablet (20 mg total) by mouth daily. 06/04/17  Yes Nicolette Bang, DO  vitamin E (NATURAL MIXED TOCOPHEROLS) 400 UNIT capsule Take 400 Units by mouth daily.    Yes [provider]  B-D ULTRAFINE III SHORT PEN 31G X 8 MM MISC USE AS DIRECTED 05/23/17   Nicolette Bang, DO  carvedilol (COREG) 6.25 MG tablet TAKE 1 TABLET (6.25 MG TOTAL) BY MOUTH 2 (TWO) TIMES DAILY WITH A MEAL. Patient not taking: Reported on 07/15/2017 06/23/17   Nicolette Bang, DO  glucose blood (ACCU-CHEK AVIVA PLUS) test strip CHECK BLOOD SUGAR FIRST  THING IN THE MORNING BEFORE EATING, AFTER LUNCH, AND  AFTER DINNER TOTAL 3 TIMES  DAILY. ICD 10-code: E11.9 04/27/17   Lupita Dawn, MD  Lancets (FREESTYLE) lancets Use as directed    [provider]    Family History Family History  Problem Relation Age of Onset  . Stroke Mother   . Heart disease Father   . Stroke Father   . Hypertension Father   . Cancer Sister   . Cancer Brother   . Heart disease Brother   . Diabetes Maternal Uncle     Social History Social History  Substance Use Topics  . Smoking status: Never Smoker  . Smokeless tobacco: Never Used  . Alcohol use No     Allergies   Hctz [hydrochlorothiazide]; Victoza [liraglutide]; Acyclovir and related; and Beta adrenergic blockers   Review of Systems Review of Systems  Neurological: Positive for dizziness and headaches.  All other systems reviewed and are negative.    Physical Exam Updated Vital Signs BP 132/61   Pulse 72   Temp 98.1 F (36.7 C) (Oral)   Resp (!) 26   Ht 5' (1.524 m)   Wt 90.7 kg (200 lb)   SpO2 93%   BMI 39.06 kg/m   Physical  Exam  Constitutional: She is oriented to person, place, and time. She appears well-developed and well-nourished. No distress.  HENT:  Head: Normocephalic and atraumatic.  Right Ear: Hearing normal.  Left Ear: Hearing normal.  Nose: Nose normal.  Mouth/Throat: Oropharynx is clear and moist and mucous membranes are normal.  Eyes: Pupils are equal, round, and reactive to light. Conjunctivae and EOM are normal.  Neck: Normal range of motion. Neck supple.  Cardiovascular: Regular rhythm, S1 normal and S2 normal.  Exam reveals no gallop and no friction rub.   No murmur heard. Pulmonary/Chest: Effort normal and breath sounds normal. No respiratory distress. She  exhibits no tenderness.  Abdominal: Soft. Normal appearance and bowel sounds are normal. There is no hepatosplenomegaly. There is no tenderness. There is no rebound, no guarding, no tenderness at McBurney's point and negative Murphy's sign. No hernia.  Musculoskeletal: Normal range of motion.  Neurological: She is alert and oriented to person, place, and time. She has normal strength. No cranial nerve deficit or sensory deficit. Coordination normal. GCS eye subscore is 4. GCS verbal subscore is 5. GCS motor subscore is 6.  Extraocular muscle movement: normal No visual field cut Pupils: equal and reactive both direct and consensual response is normal No nystagmus present    Sensory function is intact to light touch, pinprick Proprioception intact  Grip strength 5/5 symmetric in upper extremities No pronator drift Normal finger to nose bilaterally  Lower extremity strength 5/5 against gravity Normal heel to shin bilaterally  Gait: normal   Skin: Skin is warm, dry and intact. No rash noted. No cyanosis.  Psychiatric: She has a normal mood and affect. Her speech is normal and behavior is normal. Thought content normal.  Nursing note and vitals reviewed.    ED Treatments / Results  Labs (all labs ordered are listed, but only  abnormal results are displayed) Labs Reviewed  BASIC METABOLIC PANEL - Abnormal; Notable for the following:       Result Value   Glucose, Bld 103 (*)    GFR calc non Af Amer 57 (*)    All other components within normal limits  URINALYSIS, ROUTINE W REFLEX MICROSCOPIC - Abnormal; Notable for the following:    Color, Urine STRAW (*)    Protein, ur 100 (*)    Leukocytes, UA TRACE (*)    Bacteria, UA RARE (*)    Squamous Epithelial / LPF 0-5 (*)    All other components within normal limits  CBC    EKG  EKG Interpretation None       Radiology Ct Head Wo Contrast  Result Date: 07/15/2017 CLINICAL DATA:  Headache starting last night, worse today. History of hypertension and diabetes. EXAM: CT HEAD WITHOUT CONTRAST TECHNIQUE: Contiguous axial images were obtained from the base of the skull through the vertex without intravenous contrast. COMPARISON:  None. FINDINGS: Brain: No evidence of acute infarction, hemorrhage, hydrocephalus, extra-axial collection or mass lesion/mass effect. Vascular: No hyperdense vessel or unexpected calcification. Skull: Normal. Negative for fracture or focal lesion. Sinuses/Orbits: No acute finding. Other: None. IMPRESSION: No acute intracranial abnormalities. Electronically Signed   By: Lucienne Capers M.D.   On: 07/15/2017 00:22    Procedures Procedures (including critical care time)  Medications Ordered in ED Medications  hydrALAZINE (APRESOLINE) injection 20 mg (20 mg Intravenous Given 07/15/17 0305)  LORazepam (ATIVAN) injection 0.5 mg (0.5 mg Intravenous Given 07/15/17 0422)  benazepril (LOTENSIN) tablet 40 mg (40 mg Oral Given 07/15/17 0458)  carvedilol (COREG) tablet 6.25 mg (6.25 mg Oral Given 07/15/17 0425)     Initial Impression / Assessment and Plan / ED Course  I have reviewed the triage vital signs and the nursing notes.  Pertinent labs & imaging results that were available during my care of the patient were reviewed by me and considered in  my medical decision making (see chart for details).     Patient presents to emergency department for evaluation of headache. Patient reports that she started to have a headache yesterday. She is used to having these types of headaches with elevated blood pressure. She checked her pressure at home and it was elevated.  Headache became worse and moved to the left side of her head and down into the left side of her neck. This caused her to come to the ER for evaluation. She was markedly hypertensive at arrival. She underwent blood work and CT head, all of which were normal. Patient treated with hydralazine with some improvement of her blood pressure. She reported that she was very anxious and takes Ativan at home for this. She was given IV Ativan and her morning blood pressure meds and her blood pressure has now normalized. She is completely symptom-free now that her blood pressure is normal. She is therefore appropriate for discharge and prompt follow-up with her doctor at [facility] for repeat blood pressure check.  Final Clinical Impressions(s) / ED Diagnoses   Final diagnoses:  Bad headache  Essential hypertension    New Prescriptions New Prescriptions   No medications on file     Orpah Greek, MD 07/15/17 [phone]

## 2017-07-15 NOTE — Telephone Encounter (Signed)
[facility] After Hours Telephone Line   Received page on after hours line requesting to call patient. Called number listed with no response.   Adin Hector, MD, MPH PGY-3 [facility] Pager [phone]

## 2017-07-16 ENCOUNTER — Observation Stay (HOSPITAL_BASED_OUTPATIENT_CLINIC_OR_DEPARTMENT_OTHER): Payer: Medicare Other

## 2017-07-16 ENCOUNTER — Other Ambulatory Visit (HOSPITAL_COMMUNITY): Payer: Medicare Other

## 2017-07-16 DIAGNOSIS — R748 Abnormal levels of other serum enzymes: Secondary | ICD-10-CM | POA: Diagnosis not present

## 2017-07-16 DIAGNOSIS — E118 Type 2 diabetes mellitus with unspecified complications: Secondary | ICD-10-CM | POA: Diagnosis not present

## 2017-07-16 DIAGNOSIS — Z794 Long term (current) use of insulin: Secondary | ICD-10-CM | POA: Diagnosis not present

## 2017-07-16 DIAGNOSIS — I161 Hypertensive emergency: Secondary | ICD-10-CM | POA: Diagnosis not present

## 2017-07-16 DIAGNOSIS — I351 Nonrheumatic aortic (valve) insufficiency: Secondary | ICD-10-CM | POA: Diagnosis not present

## 2017-07-16 DIAGNOSIS — I5032 Chronic diastolic (congestive) heart failure: Secondary | ICD-10-CM | POA: Diagnosis not present

## 2017-07-16 LAB — RAPID URINE DRUG SCREEN, HOSP PERFORMED
AMPHETAMINES: NOT DETECTED
Barbiturates: NOT DETECTED
Benzodiazepines: POSITIVE — AB
COCAINE: NOT DETECTED
OPIATES: NOT DETECTED
TETRAHYDROCANNABINOL: NOT DETECTED

## 2017-07-16 LAB — LIPID PANEL
Cholesterol: 171 mg/dL (ref 0–200)
HDL: 41 mg/dL (ref >40)
LDL CALC: 80 mg/dL (ref 0–99)
TRIGLYCERIDES: 249 mg/dL — AB (ref <150)
Total CHOL/HDL Ratio: 4.2 RATIO
VLDL: 50 mg/dL — AB (ref 0–40)

## 2017-07-16 LAB — GLUCOSE, CAPILLARY
GLUCOSE-CAPILLARY: 299 mg/dL — AB (ref 65–99)
GLUCOSE-CAPILLARY: 245 mg/dL — AB (ref 65–99)
Glucose-Capillary: 237 mg/dL — ABNORMAL HIGH (ref 65–99)
Glucose-Capillary: 185 mg/dL — ABNORMAL HIGH (ref 65–99)
Glucose-Capillary: 190 mg/dL — ABNORMAL HIGH (ref 65–99)

## 2017-07-16 LAB — BRAIN NATRIURETIC PEPTIDE: B Natriuretic Peptide: 69.9 pg/mL (ref 0.0–100.0)

## 2017-07-16 LAB — ECHOCARDIOGRAM COMPLETE
HEIGHTINCHES: 60 in
WEIGHTICAEL: 3276.8 [oz_av]

## 2017-07-16 LAB — TROPONIN I
TROPONIN I: 0.07 ng/mL — AB (ref <0.03)
Troponin I: 0.06 ng/mL (ref <0.03)

## 2017-07-16 MED ORDER — HYDRALAZINE HCL 20 MG/ML IJ SOLN
10.0000 mg | Freq: Once | INTRAMUSCULAR | Status: AC
  Administered 2017-07-16: 10 mg via INTRAVENOUS
  Filled 2017-07-16: qty 1

## 2017-07-16 MED ORDER — INSULIN GLARGINE 100 UNIT/ML ~~LOC~~ SOLN
20.0000 [IU] | SUBCUTANEOUS | Status: AC
  Administered 2017-07-16: 20 [IU] via SUBCUTANEOUS
  Filled 2017-07-16: qty 0.2

## 2017-07-16 MED ORDER — INSULIN GLARGINE 100 UNIT/ML ~~LOC~~ SOLN
70.0000 [IU] | Freq: Every day | SUBCUTANEOUS | Status: DC
  Administered 2017-07-17: 70 [IU] via SUBCUTANEOUS
  Filled 2017-07-16: qty 0.7

## 2017-07-16 MED ORDER — HYDRALAZINE HCL 25 MG PO TABS
25.0000 mg | ORAL_TABLET | Freq: Three times a day (TID) | ORAL | Status: DC
  Administered 2017-07-16 – 2017-07-17 (×3): 25 mg via ORAL
  Filled 2017-07-16 (×3): qty 1

## 2017-07-16 MED ORDER — HYDRALAZINE HCL 10 MG PO TABS
10.0000 mg | ORAL_TABLET | Freq: Three times a day (TID) | ORAL | Status: DC
  Administered 2017-07-16: 10 mg via ORAL
  Filled 2017-07-16: qty 1

## 2017-07-16 MED ORDER — INSULIN ASPART 100 UNIT/ML ~~LOC~~ SOLN
10.0000 [IU] | Freq: Three times a day (TID) | SUBCUTANEOUS | Status: DC
  Administered 2017-07-16 – 2017-07-17 (×3): 10 [IU] via SUBCUTANEOUS

## 2017-07-16 MED ORDER — METOCLOPRAMIDE HCL 5 MG PO TABS
5.0000 mg | ORAL_TABLET | Freq: Four times a day (QID) | ORAL | Status: DC | PRN
  Administered 2017-07-16 – 2017-07-17 (×3): 5 mg via ORAL
  Filled 2017-07-16 (×3): qty 1

## 2017-07-16 NOTE — Progress Notes (Signed)
Patient's IV removed, discharge instructions given. Patient given a bath and changed in to personal clothing. Patient [facility] RN was called and given instructions on new medications. RN states she will pick them up from the [employer] in [place]. Patient was given her last blood pressure medications at 1600. Discharge paperwork updated for patient to know when next dose is due. Patient states she verbally understands. Patient discharged with [facility]AR.

## 2017-07-16 NOTE — Progress Notes (Signed)
  Echocardiogram 2D Echocardiogram has been performed.  Jennette Dubin 07/16/2017, 3:37 PM

## 2017-07-16 NOTE — Progress Notes (Signed)
PROGRESS NOTE    Rebecca Walter  CHE:[id number] DOB: [birth year] DOA: 07/15/2017 PCP: Nicolette Bang, DO   Brief Narrative: 76 y.o. female with medical history significant of hypertension, hyperlipidemia, diabetes mellitus, GERD, gout, anxiety, CHF, who presents with elevated blood pressure, headache, shortness of breath and dry cough.  In the ER patient was found to have elevated troponin of 5.36 with systolic blood pressure to 260s.  Assessment & Plan:   # Hypertensive emergency: Patient with significantly elevated blood pressure associated with positive troponin and headache. Headache is better now. CT scan of head unremarkable. -Patient is currently on amlodipine, Coreg, Aldactone and torsemide. Blood pressure is better controlled but still elevated. I will add low dose hydralazine. Monitor blood pressure today. -Follow up echocardiogram.  #Mild elevation in troponin likely demand ischemia due to hypertensive emergency. -Denied chest pain shortness of breath. EKG unremarkable. -Follow up echocardiogram  #Chronic diastolic congestive heart failure: Looks euvolemic on physical exam. Follow-up echocardiogram. Continue current cardiac medications and diuretics.  # Type 2 diabetes: Continue current dose of insulin regimen. Monitor blood sugar level.  #Hyperlipidemia: Continue statin  #GERD: Continue Protonix. Patient reported taking Reglan at home. Resume here.  #History of gout: Continue home allopurinol  PT OT evaluation.  Principal Problem:   Hypertensive emergency Active Problems:   Diabetes mellitus, type II (HCC)   HLD (hyperlipidemia)   HYPERTENSION, BENIGN SYSTEMIC   GASTROESOPHAGEAL REFLUX, NO ESOPHAGITIS   Gout   Elevated troponin   Chronic diastolic CHF (congestive heart failure) (H[facility])  DVT prophylaxis: Lovenox subcutaneous Code Status: Full code Family Communication: No family at bedside Disposition Plan: Likely discharge home in 1-2  days    Consultants:   None  Procedures: Pending echo Antimicrobials: None  Subjective: Seen and examined at bedside. Reported generalized weakness. Denied headache, dizziness, chest pain, shortness of breath, nausea or vomiting.  Objective: Vitals:   07/16/17 0029 07/16/17 0420 07/16/17 0500 07/16/17 0843  BP: (!) 176/82 (!) 182/79 (!) 176/60 (!) 177/65  Pulse:   89 84  Resp:    20  Temp:    98.3 F (36.8 C)  TempSrc:    Oral  SpO2:    95%  Weight:      Height:        Intake/Output Summary (Last 24 hours) at 07/16/17 1140 Last data filed at 07/16/17 1036  Gross per 24 hour  Intake              780 ml  Output              900 ml  Net             -120 ml   Filed Weights   07/15/17 1945 07/15/17 2344  Weight: 90.7 kg (200 lb) 92.9 kg (204 lb 12.8 oz)    Examination:  General exam: Appears calm and comfortable  Respiratory system: Clear to auscultation. Respiratory effort normal. No wheezing or crackle Cardiovascular system: S1 & S2 heard, RRR.  Trace lower extremities pedal edema. Gastrointestinal system: Abdomen is nondistended, soft and nontender. Normal bowel sounds heard. Central nervous system: Alert and oriented. No focal neurological deficits. Extremities: Symmetric 5 x 5 power. Skin: No rashes, lesions or ulcers Psychiatry: Judgement and insight appear normal. Mood & affect appropriate.     Data Reviewed: I have personally reviewed following labs and imaging studies  CBC:  Recent Labs Lab 07/14/17 2244 07/15/17 2000  WBC 9.7 10.4  HGB 13.0 13.3  HCT 37.4 38.8  MCV 86.8 87.4  PLT 230 494   Basic Metabolic Panel:  Recent Labs Lab 07/14/17 2244 07/15/17 2000  NA 141 140  K 3.5 3.7  CL 102 99*  CO2 30 29  GLUCOSE 103* 240*  BUN 15 13  CREATININE 0.94 0.95  CALCIUM 9.3 9.2   GFR: Estimated Creatinine Clearance: 50.5 mL/min (by C-G formula based on SCr of 0.95 mg/dL). Liver Function Tests: No results for input(s): AST, ALT, ALKPHOS,  BILITOT, PROT, ALBUMIN in the last 168 hours. No results for input(s): LIPASE, AMYLASE in the last 168 hours. No results for input(s): AMMONIA in the last 168 hours. Coagulation Profile: No results for input(s): INR, PROTIME in the last 168 hours. Cardiac Enzymes:  Recent Labs Lab 07/15/17 2138 07/16/17 0402 07/16/17 1027  TROPONINI 0.14* 0.07* 0.06*   BNP (last 3 results) No results for input(s): PROBNP in the last 8760 hours. HbA1C: No results for input(s): HGBA1C in the last 72 hours. CBG:  Recent Labs Lab 07/15/17 2355 07/16/17 0735  GLUCAP 238* 237*   Lipid Profile:  Recent Labs  07/16/17 0402  CHOL 171  HDL 41  LDLCALC 80  TRIG 249*  CHOLHDL 4.2   Thyroid Function Tests: No results for input(s): TSH, T4TOTAL, FREET4, T3FREE, THYROIDAB in the last 72 hours. Anemia Panel: No results for input(s): VITAMINB12, FOLATE, FERRITIN, TIBC, IRON, RETICCTPCT in the last 72 hours. Sepsis Labs: No results for input(s): PROCALCITON, LATICACIDVEN in the last 168 hours.  No results found for this or any previous visit (from the past 240 hour(s)).       Radiology Studies: Dg Chest 2 View  Result Date: 07/15/2017 CLINICAL DATA:  High blood pressure since yesterday. Shortness of breath. EXAM: CHEST  2 VIEW COMPARISON:  08/06/2012 FINDINGS: Borderline heart size with normal pulmonary vascularity. Linear fibrosis in the left lung base similar previous study. No airspace disease or consolidation. No blunting of costophrenic angles. No pneumothorax. Mediastinal contours appear intact. Calcification of the aorta. Degenerative changes in the spine and shoulders. IMPRESSION: No evidence of active pulmonary disease.  Aortic atherosclerosis. Electronically Signed   By: Lucienne Capers M.D.   On: 07/15/2017 20:41   Ct Head Wo Contrast  Result Date: 07/15/2017 CLINICAL DATA:  Headache starting last night, worse today. History of hypertension and diabetes. EXAM: CT HEAD WITHOUT  CONTRAST TECHNIQUE: Contiguous axial images were obtained from the base of the skull through the vertex without intravenous contrast. COMPARISON:  None. FINDINGS: Brain: No evidence of acute infarction, hemorrhage, hydrocephalus, extra-axial collection or mass lesion/mass effect. Vascular: No hyperdense vessel or unexpected calcification. Skull: Normal. Negative for fracture or focal lesion. Sinuses/Orbits: No acute finding. Other: None. IMPRESSION: No acute intracranial abnormalities. Electronically Signed   By: Lucienne Capers M.D.   On: 07/15/2017 00:22        Scheduled Meds: . allopurinol  200 mg Oral Daily  . amLODipine  10 mg Oral Daily  . aspirin  324 mg Oral Daily  . carvedilol  6.25 mg Oral BID WC  . enoxaparin (LOVENOX) injection  40 mg Subcutaneous QHS  . famotidine  20 mg Oral BID  . insulin aspart  0-9 Units Subcutaneous TID WC  . insulin glargine  50 Units Subcutaneous Daily  . multivitamin with minerals  1 tablet Oral Daily  . pantoprazole  40 mg Oral Daily  . pravastatin  80 mg Oral q1800  . spironolactone  12.5 mg Oral Daily  . torsemide  20 mg Oral Daily  .  vitamin E  400 Units Oral Daily   Continuous Infusions:   LOS: 0 days    Leeon Makar Tanna Furry, MD [facility] Pager [phone]  If 7PM-7AM, please contact night-coverage [email or web address] Password [email or web address] 07/16/2017, 11:40 AM

## 2017-07-17 DIAGNOSIS — I161 Hypertensive emergency: Secondary | ICD-10-CM | POA: Diagnosis not present

## 2017-07-17 DIAGNOSIS — I5032 Chronic diastolic (congestive) heart failure: Secondary | ICD-10-CM | POA: Diagnosis not present

## 2017-07-17 DIAGNOSIS — E118 Type 2 diabetes mellitus with unspecified complications: Secondary | ICD-10-CM | POA: Diagnosis not present

## 2017-07-17 DIAGNOSIS — R748 Abnormal levels of other serum enzymes: Secondary | ICD-10-CM | POA: Diagnosis not present

## 2017-07-17 DIAGNOSIS — Z794 Long term (current) use of insulin: Secondary | ICD-10-CM | POA: Diagnosis not present

## 2017-07-17 LAB — GLUCOSE, CAPILLARY
Glucose-Capillary: 213 mg/dL — ABNORMAL HIGH (ref 65–99)
Glucose-Capillary: 262 mg/dL — ABNORMAL HIGH (ref 65–99)

## 2017-07-17 LAB — HEMOGLOBIN A1C
Hgb A1c MFr Bld: 7.7 % — ABNORMAL HIGH (ref 4.8–5.6)
Mean Plasma Glucose: 174 mg/dL

## 2017-07-17 MED ORDER — HYDRALAZINE HCL 25 MG PO TABS: 25.0000 mg | ORAL_TABLET | Freq: Three times a day (TID) | ORAL | 0 refills | Status: DC

## 2017-07-17 MED ORDER — SENNOSIDES-DOCUSATE SODIUM 8.6-50 MG PO TABS: 1.0000 | ORAL_TABLET | Freq: Two times a day (BID) | ORAL | 0 refills | Status: DC

## 2017-07-17 MED ORDER — CARVEDILOL 6.25 MG PO TABS: 6.2500 mg | ORAL_TABLET | Freq: Two times a day (BID) | ORAL | 0 refills | Status: DC

## 2017-07-17 MED ORDER — AMLODIPINE BESYLATE 10 MG PO TABS: 10.0000 mg | ORAL_TABLET | Freq: Every day | ORAL | 0 refills | Status: DC

## 2017-07-17 MED ORDER — PSYLLIUM 95 % PO PACK: 1.0000 | PACK | Freq: Every day | ORAL | Status: DC

## 2017-07-17 MED ORDER — SENNOSIDES-DOCUSATE SODIUM 8.6-50 MG PO TABS
1.0000 | ORAL_TABLET | Freq: Two times a day (BID) | ORAL | Status: DC
  Administered 2017-07-17: 1 via ORAL
  Filled 2017-07-17: qty 1

## 2017-07-17 NOTE — Progress Notes (Signed)
Discharge instructions reviewed with patient and her son, questions answered, verbalized understanding.  WEnt over extensively all patients medications, when they were last given and when next due.  Prescriptions for new medications given.  Home health set up.  Patient transported to front of hospital via wheelchair to be taken home by son.

## 2017-07-17 NOTE — Evaluation (Signed)
Physical Therapy Evaluation Patient Details Name: Rebecca Walter MRN: [id number] DOB: 12-15-1941 Today's Date: 07/17/2017   History of Present Illness  Rebecca Walter is a 76 y.o. female with medical history significant of hypertension, hyperlipidemia, diabetes mellitus, GERD, gout, anxiety, CHF, who presents with elevated blood pressure, headache, shortness of breath and dry cough. HTN emergency, as of 7/29 BP back down.   Clinical Impression  Patient evaluated by Physical Therapy with no further acute PT needs identified. All education has been completed and the patient has no further questions. Pt presented awake, and alert, out of bed and packing her belongings. Pt reported feeling tired, and weak. She was functional in walking and required minimal cues for safety and self-monitoring activity level. BP remained within a therapeutic range after ambulation with a 2-wheel walker. [name] is on track for a safe discharge home, and it is recommended for a HHPT and HHRN follow up. See below for any follow-up Physical Therapy or equipment needs. PT is signing off. Thank you for this referral.    Follow Up Recommendations Home health PT;Other (comment) (Home health RN for chronic disease management)    Equipment Recommendations  None   Recommendations for Other Services OT consult     Precautions / Restrictions Precautions Precautions: None Restrictions Weight Bearing Restrictions: No      Mobility  Bed Mobility                  Transfers Overall transfer level: Modified independent Equipment used: Rolling walker (2 wheeled);Straight cane      Cues for hand placement and safety          Ambulation/Gait Ambulation/Gait assistance: Supervision (cues to self-monitor activity tolerance) Ambulation Distance (Feet): 50 Feet Assistive device: Rolling walker (2 wheeled) Gait Pattern/deviations: Step-through pattern;Decreased step length - right;Decreased step length - left          Stairs            Wheelchair Mobility    Modified Rankin (Stroke Patients Only)       Balance                                             Pertinent Vitals/Pain Pain Assessment: No/denies pain  HR: 78 SpO2: 96% BP: 168/70    Home Living Family/patient expects to be discharged to:: Private residence Living Arrangements: Alone Available Help at Discharge: Other (Comment) (Son will be with her for day of d/c) Type of Home: Apartment Home Access: Level entry     Home Layout: One level       Prior Function Level of Independence: Independent with assistive device(s)               Hand Dominance        Extremity/Trunk Assessment                Communication      Cognition Arousal/Alertness: Awake/alert Behavior During Therapy: WFL for tasks assessed/performed Overall Cognitive Status: Within Functional Limits for tasks assessed                                        General Comments      Exercises     Assessment/Plan    PT Assessment All further PT needs  can be met in the next venue of care  PT Problem List Decreased strength;Decreased activity tolerance;Decreased balance;Obesity       PT Treatment Interventions      PT Goals (Current goals can be found in the Care Plan section)       Frequency     Barriers to discharge  Pt reports she would like to remain in the hospital for another day due to feelings of weakness. However, there is no medical necessity preventing discharge.       Co-evaluation               AM-PAC PT "6 Clicks" Daily Activity  Outcome Measure                  End of Session Equipment Utilized During Treatment: Gait belt Activity Tolerance: Patient tolerated treatment well Patient left: in bed;with call bell/phone within reach (sitting EOB) Nurse Communication: Other (comment) (Spoke to RN about d/c plan to home with HHPT and [place])      Time:  4370- 0525     Charges:    PT evaluation Low Complexity Gait Training     PT G Codes:        Ave Filter, SPT Acute Rehabilitation Services Office: [phone]  Ave Filter 07/17/2017, 11:02 AM   I was present during the PT session and agree with patient status and findings as outlined by Marianna Fuss, SPT.  Roney Walter, Rebecca  Acute Rehabilitation Services Pager [phone] Office [phone]

## 2017-07-17 NOTE — Care Management Note (Signed)
Case Management Note Marvetta Gibbons RN, BSN Unit 4E-Case Manager-- 3E coverage [phone]  Patient Details  Name: Rebecca Walter MRN: [id number] Date of Birth: 01/04/1941  Subjective/Objective:  Pt presented with HTN emergency                   Action/Plan: PTA Pt lived at home alone- has cane and rollator- HHRN/PT/OT/aide ordered- per conversation with pt and son at bedside- pt has an aide that comes Mon/Fri. -is agreeable to the other [facility] services- choice offered for [facility] agency- pt and son selected [facility]- however on referral [facility] states that they can not provide RN services- [facility] also called and can not provide RN- per pt and son they are fine with using any agency as long as they take [facility] and provide services- call made to Butch Penny with [facility]- services can be provided and referral accepted- pt and son informed that [facility] would provide services they are in agreement- DME order placed for rollator- per pt she already has one at home- notified [facility] that DME is not needed.   Expected Discharge Date:  07/17/17               Expected Discharge Plan:  [place]  In-House Referral:     Discharge planning Services  CM Consult  Post Acute Care Choice:  Home Health, Durable Medical Equipment Choice offered to:  Patient, Adult Children  DME Arranged:  Walker rolling with seat DME Agency:     HH Arranged:  RN, PT, OT, Nurse's Aide [place] Agency:  [facility] ([facility])  Status of Service:  Completed, signed off  If discussed at [employer] of Stay Meetings, dates discussed:    Discharge Disposition: home/home health   Additional Comments:  Dawayne Patricia, RN 07/17/2017, 12:57 PM

## 2017-07-17 NOTE — Discharge Summary (Signed)
Physician Discharge Summary  Rebecca Walter YNW:[id number] DOB: 1941/06/03 DOA: 07/15/2017  PCP: Nicolette Bang, DO  Admit date: 07/15/2017 Discharge date: 07/17/2017  Admitted From:home Disposition: home  Recommendations for Outpatient Follow-up:  1. Follow up with PCP in 1-2 weeks 2. Please obtain BMP/CBC in one week   Home Health:yes Equipment/Devices:walker Discharge Condition:stable CODE STATUS:full code Diet recommendation:carb modified heart healthy diet  Brief/Interim Summary: 76 y.o.femalewith medical history significant of hypertension, hyperlipidemia, diabetes mellitus, GERD, gout, anxiety, CHF, who presents with elevated blood pressure,headache, shortness of breath and dry cough. In the ER patient was found to have elevated troponin of 6.57 with systolic blood pressure to 260s.  # Hypertensive emergency: Patient with significantly elevated blood pressure associated with positive troponin and headache on admission.  CT scan of head unremarkable. -Patient did not want to use benazepril and has not been taking Coreg. Education provided to the patient regarding importance of medications and low-salt diet. Patient was admitted on amlodipine and hydralazine in the hospital. Recommended to continue Coreg and Aldactone and torsemide. Her blood pressure better controlled. I recommended patient to monitor blood pressure at least twice a day and follow up with PCP. Patient denied headache, dizziness, chest pain or shortness of breath.Echocardiogram showed 60-65% EF with normal systolic function.  She was evaluated by PT OT recommended home care services. At this time patient is medically stable to go home with home care services and current medications.   #Mild elevation in troponin likely demand ischemia due to hypertensive emergency. -Denied chest pain shortness of breath. EKG unremarkable. -Echo with normal EF systolic function however has grade 1 diastolic  dysfunction.  #Chronic diastolic congestive heart failure: Looks euvolemic on physical exam.  Continue current cardiac medications and diuretics.  # Type 2 diabetes on chronic insulin: Continue current dose of insulin regimen.  #Hyperlipidemia: Continue statin  #GERD: Continue Protonix. Patient reported taking Reglan at home. Resume here.  #History of gout: Continue home allopurinol  Patient is is clinically improved. Blood pressure is better controlled. Needs close monitoring of blood pressure and follow-up with PCP. I discussed about current medication and discharge planning. Patient verbalized understanding. Home care services ordered.  Discharge Diagnoses:  Principal Problem:   Hypertensive emergency Active Problems:   Diabetes mellitus, type II (HCC)   HLD (hyperlipidemia)   HYPERTENSION, BENIGN SYSTEMIC   GASTROESOPHAGEAL REFLUX, NO ESOPHAGITIS   Gout   Elevated troponin   Chronic diastolic CHF (congestive heart failure) [facility])    Discharge Instructions  Discharge Instructions    Call MD for:  difficulty breathing, headache or visual disturbances    Complete by:  As directed    Call MD for:  extreme fatigue    Complete by:  As directed    Call MD for:  hives    Complete by:  As directed    Call MD for:  persistant dizziness or light-headedness    Complete by:  As directed    Call MD for:  persistant nausea and vomiting    Complete by:  As directed    Call MD for:  severe uncontrolled pain    Complete by:  As directed    Call MD for:  temperature >100.4    Complete by:  As directed    Diet - low sodium heart healthy    Complete by:  As directed    Diet Carb Modified    Complete by:  As directed    Discharge instructions    Complete by:  As directed    Please monitor blood pressure at this 2 times a day and monitor blood sugar level III times a day at home. Please follow-up with her PCP in 2-3 days.   For home use only DME 4 wheeled rolling walker with seat     Complete by:  As directed    Patient needs a walker to treat with the following condition:  Weakness   Increase activity slowly    Complete by:  As directed      Allergies as of 07/17/2017      Reactions   Hctz [hydrochlorothiazide] Other (See Comments)   Uric acid elevation and gout.      Victoza [liraglutide] Other (See Comments)   Tongue Glossitus   Lotensin [benazepril]    Dry cough   Acyclovir And Related Diarrhea   Beta Adrenergic Blockers Other (See Comments)   Occurred with metoprolol  REACTION: coughing      Medication List    STOP taking these medications   benazepril 20 MG tablet Commonly known as:  LOTENSIN   benazepril 40 MG tablet Commonly known as:  LOTENSIN     TAKE these medications   allopurinol 100 MG tablet Commonly known as:  ZYLOPRIM TAKE 2 TABLETS BY MOUTH EVERY DAY   amLODipine 10 MG tablet Commonly known as:  NORVASC Take 1 tablet (10 mg total) by mouth daily.   aspirin 81 MG chewable tablet Chew 1 tablet (81 mg total) by mouth daily.   B-D ULTRAFINE III SHORT PEN 31G X 8 MM Misc Generic drug:  Insulin Pen Needle USE AS DIRECTED   baclofen 10 MG tablet Commonly known as:  LIORESAL TAKE 1 TABLET BY MOUTH 3 TIMES A DAY AS NEEDED FOR MUSCLE SPASMS   carvedilol 6.25 MG tablet Commonly known as:  COREG Take 1 tablet (6.25 mg total) by mouth 2 (two) times daily with a meal.   glucose blood test strip Commonly known as:  ACCU-CHEK AVIVA PLUS CHECK BLOOD SUGAR FIRST  THING IN THE MORNING BEFORE EATING, AFTER LUNCH, AND  AFTER DINNER TOTAL 3 TIMES  DAILY. ICD 10-code: E11.9   hydrALAZINE 25 MG tablet Commonly known as:  APRESOLINE Take 1 tablet (25 mg total) by mouth every 8 (eight) hours.   insulin aspart 100 UNIT/ML FlexPen Commonly known as:  NOVOLOG FLEXPEN - Before breakfast: Take 30 units if your blood sugar is 80-180, if it is > 180 take 35 units - Before lunch: Take 15 units if your blood sugar is 80-180, if it is > 180 take 20  units - Before dinner: Take 20 units if your blood sugar is 80-180, if it is > 180 take 25 units   LANTUS SOLOSTAR 100 UNIT/ML Solostar Pen Generic drug:  Insulin Glargine INJECT 70 UNITS INTO THE SKIN EVERY MORNING.   LORazepam 1 MG tablet Commonly known as:  ATIVAN Take 1 tablet (1 mg total) by mouth 2 (two) times daily as needed for anxiety.   lovastatin 40 MG tablet Commonly known as:  MEVACOR TAKE 2 TABLETS BY MOUTH EVERY DAY What changed:  See the new instructions.   metFORMIN 500 MG 24 hr tablet Commonly known as:  GLUCOPHAGE-XR TAKE 1 TABLET BY MOUTH 2 TIMES A DAY BEFORE MEALS   multivitamin tablet Take 1 tablet by mouth daily.   NATURAL MIXED TOCOPHEROLS 400 UNIT capsule Generic drug:  vitamin E Take 400 Units by mouth daily.   pantoprazole 40 MG tablet Commonly known as:  PROTONIX Take  40 mg by mouth daily.   PROBIOTIC PEARLS Caps Take 1 capsule by mouth once.   ranitidine 150 MG tablet Commonly known as:  ZANTAC Take 150 mg by mouth 2 (two) times daily.   senna-docusate 8.6-50 MG tablet Commonly known as:  Senokot-S Take 1 tablet by mouth 2 (two) times daily.   spironolactone 25 MG tablet Commonly known as:  ALDACTONE TAKE 0.5 TABLETS (12.5 MG TOTAL) BY MOUTH DAILY.   torsemide 20 MG tablet Commonly known as:  DEMADEX Take 1 tablet (20 mg total) by mouth daily.            Durable Medical Equipment        Start     Ordered   07/17/17 0000  For home use only DME 4 wheeled rolling walker with seat    Question:  Patient needs a walker to treat with the following condition  Answer:  Weakness   07/17/17 1145     Follow-up Information    Nicolette Bang, DO. Schedule an appointment as soon as possible for a visit in 1 week(s).   Specialty:  Family Medicine Contact information: [address] [place] [address] [phone]          Allergies  Allergen Reactions  . Hctz [Hydrochlorothiazide] Other (See Comments)    Uric  acid elevation and gout.     Donna Bernard [Liraglutide] Other (See Comments)    Tongue Glossitus  . Lotensin [Benazepril]     Dry cough   . Acyclovir And Related Diarrhea  . Beta Adrenergic Blockers Other (See Comments)    Occurred with metoprolol  REACTION: coughing    Consultations: None  Procedures/Studies: Echo  Subjective: Seen and examined at bedside. Denied headache, dizziness, chest pain, nausea vomiting and abdominal pain.  Discharge Exam: Vitals:   07/17/17 0615 07/17/17 0952  BP: (!) 153/63 (!) 161/70  Pulse: 88 79  Resp: 18   Temp: 98.1 F (36.7 C)    Vitals:   07/16/17 1900 07/16/17 1954 07/17/17 0615 07/17/17 0952  BP: (!) 147/53 137/63 (!) 153/63 (!) 161/70  Pulse:  94 88 79  Resp:  18 18   Temp:  98.6 F (37 C) 98.1 F (36.7 C)   TempSrc:  Oral Oral   SpO2:  97% 98% 95%  Weight:   90.9 kg (200 lb 4.8 oz)   Height:        General: Pt is alert, awake, not in acute distress Cardiovascular: RRR, S1/S2 +, no rubs, no gallops Respiratory: CTA bilaterally, no wheezing, no rhonchi Abdominal: Soft, NT, ND, bowel sounds + Extremities: Trace lower extremities edema, no cyanosis    The results of significant diagnostics from this hospitalization (including imaging, microbiology, ancillary and laboratory) are listed below for reference.     Microbiology: No results found for this or any previous visit (from the past 240 hour(s)).   Labs: BNP (last 3 results)  Recent Labs  07/15/17 2000  BNP 85.2   Basic Metabolic Panel:  Recent Labs Lab 07/14/17 2244 07/15/17 2000  NA 141 140  K 3.5 3.7  CL 102 99*  CO2 30 29  GLUCOSE 103* 240*  BUN 15 13  CREATININE 0.94 0.95  CALCIUM 9.3 9.2   Liver Function Tests: No results for input(s): AST, ALT, ALKPHOS, BILITOT, PROT, ALBUMIN in the last 168 hours. No results for input(s): LIPASE, AMYLASE in the last 168 hours. No results for input(s): AMMONIA in the last 168 hours. CBC:  Recent  Labs Lab  07/14/17 2244 07/15/17 2000  WBC 9.7 10.4  HGB 13.0 13.3  HCT 37.4 38.8  MCV 86.8 87.4  PLT 230 258   Cardiac Enzymes:  Recent Labs Lab 07/15/17 2138 07/16/17 0402 07/16/17 1027  TROPONINI 0.14* 0.07* 0.06*   BNP: Invalid input(s): POCBNP CBG:  Recent Labs Lab 07/16/17 1207 07/16/17 1610 07/16/17 2036 07/16/17 2136 07/17/17 0724  GLUCAP 299* 245* 185* 190* 213*   D-Dimer No results for input(s): DDIMER in the last 72 hours. Hgb A1c  Recent Labs  07/16/17 0402  HGBA1C 7.7*   Lipid Profile  Recent Labs  07/16/17 0402  CHOL 171  HDL 41  LDLCALC 80  TRIG 249*  CHOLHDL 4.2   Thyroid function studies No results for input(s): TSH, T4TOTAL, T3FREE, THYROIDAB in the last 72 hours.  Invalid input(s): FREET3 Anemia work up No results for input(s): VITAMINB12, FOLATE, FERRITIN, TIBC, IRON, RETICCTPCT in the last 72 hours. Urinalysis    Component Value Date/Time   COLORURINE STRAW (A) 07/14/2017 2247   APPEARANCEUR CLEAR 07/14/2017 2247   LABSPEC 1.006 07/14/2017 2247   PHURINE 7.0 07/14/2017 2247   GLUCOSEU NEGATIVE 07/14/2017 2247   HGBUR NEGATIVE 07/14/2017 2247   HGBUR negative 07/28/2010 1028   BILIRUBINUR NEGATIVE 07/14/2017 2247   BILIRUBINUR NEG 01/14/2017 1403   KETONESUR NEGATIVE 07/14/2017 2247   PROTEINUR 100 (A) 07/14/2017 2247   UROBILINOGEN 0.2 01/14/2017 1403   UROBILINOGEN 0.2 08/06/2012 1913   NITRITE NEGATIVE 07/14/2017 2247   LEUKOCYTESUR TRACE (A) 07/14/2017 2247   Sepsis Labs Invalid input(s): PROCALCITONIN,  WBC,  LACTICIDVEN Microbiology No results found for this or any previous visit (from the past 240 hour(s)).   Time coordinating discharge: 31 minutes  SIGNED:   Rosita Fire, MD  [facility] 07/17/2017, 11:45 AM  If 7PM-7AM, please contact night-coverage [email or web address] Password [email or web address]

## 2017-07-19 ENCOUNTER — Ambulatory Visit: Payer: Medicare Other | Admitting: Physical Therapy

## 2017-07-19 ENCOUNTER — Telehealth: Payer: Self-pay | Admitting: Internal Medicine

## 2017-07-19 NOTE — Telephone Encounter (Signed)
PT will start with home health service on Friday 07-22-17

## 2017-07-20 ENCOUNTER — Ambulatory Visit (INDEPENDENT_AMBULATORY_CARE_PROVIDER_SITE_OTHER): Payer: Medicare Other | Admitting: Internal Medicine

## 2017-07-20 ENCOUNTER — Encounter: Payer: Self-pay | Admitting: Internal Medicine

## 2017-07-20 ENCOUNTER — Ambulatory Visit (HOSPITAL_COMMUNITY)
Admission: RE | Admit: 2017-07-20 | Discharge: 2017-07-20 | Disposition: A | Discharge status: Home / Self Care | Payer: Medicare Other | Source: Ambulatory Visit | Attending: Family Medicine | Admitting: Family Medicine

## 2017-07-20 VITALS — BP 156/60 | HR 72 | Temp 98.6°F | Ht 60.0 in | Wt 202.6 lb

## 2017-07-20 DIAGNOSIS — R0602 Shortness of breath: Secondary | ICD-10-CM

## 2017-07-20 DIAGNOSIS — Z794 Long term (current) use of insulin: Secondary | ICD-10-CM | POA: Diagnosis not present

## 2017-07-20 DIAGNOSIS — E118 Type 2 diabetes mellitus with unspecified complications: Secondary | ICD-10-CM | POA: Diagnosis not present

## 2017-07-20 DIAGNOSIS — F419 Anxiety disorder, unspecified: Secondary | ICD-10-CM | POA: Diagnosis not present

## 2017-07-20 DIAGNOSIS — I5032 Chronic diastolic (congestive) heart failure: Secondary | ICD-10-CM | POA: Diagnosis not present

## 2017-07-20 DIAGNOSIS — I10 Essential (primary) hypertension: Secondary | ICD-10-CM

## 2017-07-20 DIAGNOSIS — K5900 Constipation, unspecified: Secondary | ICD-10-CM | POA: Insufficient documentation

## 2017-07-20 DIAGNOSIS — K219 Gastro-esophageal reflux disease without esophagitis: Secondary | ICD-10-CM | POA: Diagnosis not present

## 2017-07-20 DIAGNOSIS — K5909 Other constipation: Secondary | ICD-10-CM

## 2017-07-20 DIAGNOSIS — R9431 Abnormal electrocardiogram [ECG] [EKG]: Secondary | ICD-10-CM | POA: Insufficient documentation

## 2017-07-20 MED ORDER — ALUM & MAG HYDROXIDE-SIMETH 200-200-20 MG/5ML PO SUSP: 30.0000 mL | Freq: Four times a day (QID) | ORAL | 0 refills | Status: DC | PRN

## 2017-07-20 MED ORDER — LOSARTAN POTASSIUM 50 MG PO TABS: 50.0000 mg | ORAL_TABLET | Freq: Every day | ORAL | 0 refills | Status: DC

## 2017-07-20 NOTE — Assessment & Plan Note (Addendum)
Patient endorses a persistent chocking sensation that feels like the symptoms of GERD that she has experienced in the past. However, these symptoms are not alleviated by taking the pantoprazole and ranitidine she has used to manage this condition in the past.  -Treat with Maalox, lidocaine and Donnatal for symptom release  -continue pantoprazole and ranitidine  -follow up to monitor symptoms after changes in BP medications have been made

## 2017-07-20 NOTE — Assessment & Plan Note (Signed)
Since discharge from the hospital, the patient reports higher than usual blood sugars despite compliance with insulin and metformin therapy. In the setting of acute stress post-hospitalization and with other presenting symptoms we will not make changes to the management of this condition. -continue Lantus injections  -continue metformin  -encourage patient to keep checking blood sugars

## 2017-07-20 NOTE — Patient Instructions (Signed)
I have prescribed Losartan for your blood pressure. Stop the Coreg.   I have prescribed a GI cocktail to help with your choking sensation. Remain upright after eating and don't eat prior to bedtime.   Please follow up with me or Dr. Valentina Lucks in one week. We will plan to check labs at that visit.

## 2017-07-20 NOTE — Assessment & Plan Note (Addendum)
During her hospitalization, Ms. Linnemann was started on Carvedilol and hydralizine. Since discharge her blood pressures have ranaged from 155-180, but she has also felt increased SOB and weakness. She endorsed similar feelings in the past while being on a B-blocker.  -D/c Carvdeilol  -Start losartan (patient previously experienced cough on ACEi)  -check BMET in one week after starting Losartan  -continue Amlodipine10mg  and hydralazine 25mg  q8

## 2017-07-20 NOTE — Assessment & Plan Note (Signed)
On presentation today the patient reports increased weakness and shortness of breath and has evident peripheral edema on physical exam. However, the shortness of breath is more associated with GERD symptoms and lungs are clear to ascultation.  -Increase torsemide to 40mg  for 3 days to manage hypervolemia evident on examination of extremities  -continue Spironolactone 12.5 mg

## 2017-07-20 NOTE — Assessment & Plan Note (Signed)
Patient reports recently increased anxiety and took an ativan before presenting to clinic today. She requested an increase in dosage, but was counseled on the dangers of such an increase in the setting of weakness, dizziness and increased fall risk. -continue ativan 1mg  PRN  -follow up on anxiety management as acute symptoms improve

## 2017-07-20 NOTE — Progress Notes (Signed)
Date of Visit: 07/20/2017   HPI:  Rebecca Walter is a 1 yoF with a PMH of  hypertension, hyperlipidemia, diabetes mellitus, GERD, gout, anxiety, CHFwho presents today for follow up after her recent hospitalization.  Hypertension: Rebecca Walter was recently admitted to the hospital with hypertensive urgency. During this hospitalization she was started on hydralazine 25mg  q8 and carvedilol 6.25 mg. Since discharge she reports compliance with her antihypertensive medications and has been able to check her Bps consistently with values ranging from 619-509 systolic, which are "good for her," and one value of 192 on the morning of her appointment today. She does however report a generalized feeling of weakness since discharge that has not improved. She denies feeling dizzy when she stands from seated but does endorse a sense of being generally more off balance than usual. In addition, she reports left sided facial numbness, which she has noticed over the last several days.   GERD/Choking sensation: Since her discharge from the hospital the patient has experienced a choking sensation. This sensation is similar to her usual symptoms of GERD, but is not relieved by the medications she takes for this condition and is more constant than usual. This feeling is made worse by eating and associated with increased shortness of breath. It is unclear is exercise or normal activity makes this feeling worse. Rebecca Walter endorses occasional dysphagia to solids but said that this has not gotten worse since her hospital stay.   Type 2 Diabetes on Chronic Insulin: Since leaving the hospital Rebecca Walter reports her blood sugar levels have been higher than they usually are. She has recorded values in the high 200s with one value of 302 occurring after taking her insulin for the day. She reports compliance with her insulin and metformin and denies any low blood glucose levels since discharge.   Chronic Diastolic Congestive heart  Failure: The patient reports continued shortness of breath and difficulty with walking. She says that she sleeps on 3 pillows at night, which is unchanged from her baseline prior to hospitalization.   Constipation: The patient reports being constipated since her time in the hospital. She has had a bowel movement since leaving the hospital but is still not back to her baseline.   Generalized Anxiety Disorder: Patient reports that she took an Ativan before presenting today to control the anxiety she felt.   ROS: Patient endorses left sided facial numbness    PHYSICAL EXAM: BP (!) 156/60   Pulse 72   Temp 98.6 F (37 C) (Oral)   Ht 5' (1.524 m)   Wt 202 lb 9.6 oz (91.9 kg)   SpO2 94%   BMI 39.57 kg/m  Gen: Chronically ill appearing female in no acute distress  HEENT: mucous membranes moist, no anterior or posterior cervical lymphadenopathy, pupils equally round and reactive  Heart: RRR, II/IV systolic murmur best appreciated over left upper sternal border  Lungs: No increased work of breathing, no wheezes rales or rhonchi, no crackles in upper or lower lung fields bilaterally   Neuro: CN II-XII intact, strength and sensation intact in all extremities  Ext: radial pulses intact bilaterally, 2+ edema to knee with 1+ edema on posterior thigh   ASSESSMENT/PLAN:  HYPERTENSION, BENIGN SYSTEMIC During her hospitalization, Rebecca Walter was started on Carvedilol and hydralizine. Since discharge her blood pressures have ranaged from 155-180, but she has also felt increased SOB and weakness. She endorsed similar feelings in the past while being on a B-blocker.  -D/c Carvdeilol  -Start losartan (patient  previously experienced cough on ACEi)  -check BMET in one week after starting Losartan  -continue Amlodipine10mg  and hydralazine 25mg  q8  GASTROESOPHAGEAL REFLUX, NO ESOPHAGITIS Patient endorses a persistent chocking sensation that feels like the symptoms of GERD that she has experienced in the  past. However, these symptoms are not alleviated by taking the pantoprazole and ranitidine she has used to manage this condition in the past.  -Treat with Maalox, lidocaine and Donnatal for symptom release  -continue pantoprazole and ranitidine  -follow up to monitor symptoms after changes in BP medications have been made   Diabetes mellitus, type II ([place]) Since discharge from the hospital, the patient reports higher than usual blood sugars despite compliance with insulin and metformin therapy. In the setting of acute stress post-hospitalization and with other presenting symptoms we will not make changes to the management of this condition. -continue Lantus injections  -continue metformin  -encourage patient to keep checking blood sugars   Chronic diastolic CHF (congestive heart failure) ([place]) On presentation today the patient reports increased weakness and shortness of breath and has evident peripheral edema on physical exam. However, the shortness of breath is more associated with GERD symptoms and lungs are clear to ascultation.  -Increase torsemide to 40mg  for 3 days to manage hypervolemia evident on examination of extremities  -continue Spironolactone 12.5 mg   Anxiety Patient reports recently increased anxiety and took an ativan before presenting to clinic today. She requested an increase in dosage, but was counseled on the dangers of such an increase in the setting of weakness, dizziness and increased fall risk. -continue ativan 1mg  PRN  -follow up on anxiety management as acute symptoms improve   FOLLOW UP: Follow up in 1 week for management of chronic medical conditions

## 2017-07-22 ENCOUNTER — Other Ambulatory Visit: Payer: Self-pay | Admitting: Internal Medicine

## 2017-07-22 ENCOUNTER — Telehealth: Payer: Self-pay | Admitting: Internal Medicine

## 2017-07-22 DIAGNOSIS — M25551 Pain in right hip: Secondary | ICD-10-CM

## 2017-07-22 NOTE — Telephone Encounter (Signed)
Medication refilled, please inform patient.

## 2017-07-22 NOTE — Telephone Encounter (Signed)
[facility] just wanted PCP aware that they will be going out to start home health services on Wednesday. ep

## 2017-07-22 NOTE — Telephone Encounter (Signed)
Pt is calling because she knows that her PCP is out of town and that another doctor will be filling her prescriptions. She really needs to get her Baclofen today in afternoon, since her aid will be there and can go pick this up for her. Please call patient and let her know so she can send her aid to get this. jw

## 2017-07-25 ENCOUNTER — Telehealth: Payer: Self-pay | Admitting: Pharmacist

## 2017-07-25 ENCOUNTER — Telehealth: Payer: Self-pay | Admitting: Internal Medicine

## 2017-07-25 NOTE — Telephone Encounter (Signed)
Pt informed and had already picked up Rx. Katharina Caper, April D, [place]

## 2017-07-25 NOTE — Telephone Encounter (Signed)
Pt is calling because she has issues with her New BP medications. She doesn't like how this makes her feel.weird and she doesn't feel good. She really needs to talk to Dr. Valentina Lucks about managing her BP medications so that they can get the right combination. jw

## 2017-07-25 NOTE — Telephone Encounter (Signed)
Returned call to assess blood pressure medication tolerability.  Currently taking three blood pressure medications.   Following lengthy discussion (> 10 minutes) comparing agents. and reports feeling weak and she finds it hard to stand up.   She thought her symptoms were worse due to starting hydralazine.  She did not think her feelings were due to losartan.   I asked her to decrease her hydralazine use until Thursday when I can see her.  At that time, I am anticipating consideration for use of clonidine.  Reevaluate in person on Thursday.

## 2017-07-26 ENCOUNTER — Ambulatory Visit (INDEPENDENT_AMBULATORY_CARE_PROVIDER_SITE_OTHER): Payer: Medicare Other | Admitting: Family Medicine

## 2017-07-26 VITALS — BP 168/69 | HR 103 | Temp 98.6°F | Ht 60.0 in | Wt 199.8 lb

## 2017-07-26 DIAGNOSIS — I10 Essential (primary) hypertension: Secondary | ICD-10-CM | POA: Diagnosis not present

## 2017-07-26 NOTE — Progress Notes (Signed)
    Subjective:    Patient ID: Rebecca Walter, female    DOB: [birth year], 76 y.o.   MRN: [id number]   CC: high blood pressure  Blood pressure 170/72 in clinic. Endorses: generalized weakness and some nausea Denies: blurred vision, headache, chest pain, shortness of breath, decreased urination. Taking hydralazine, norvasc, spironolactone, torsemide, and valsartan.  She took hydralazine on an empty stomach yesterday morning and felt badly. She called Dr. Valentina Lucks and was told to decrease Hydral to BID until he saw her Thursday and they would discuss further at that time. She took the hydral again last evening and her blood pressure was elevated to 202/86, she called the after hours line and was instructed to go to ED but did not want to. She took more hydral per Dr. Ernestina Penna advice around 3 am. She woke up this morning and took all her medications. She feels ok this morning but as above just weak generally with some nausea.   She states her systolic BP is always in 646'O never lower than 168. She was worried it was above 200 last night.   Smoking status reviewed  Review of Systems- see HPI   Objective:  BP (!) 168/69   Pulse (!) 103   Temp 98.6 F (37 C) (Oral)   Ht 5' (1.524 m)   Wt 199 lb 12.8 oz (90.6 kg)   SpO2 91%   BMI 39.02 kg/m   Manual recheck in room 168/78 Vitals and nursing note reviewed  General: well nourished, in no acute distress Cardiac: RRR, clear S1 and S2, no murmurs, rubs, or gallops Respiratory: clear to auscultation bilaterally, no increased work of breathing Extremities: no edema or cyanosis Skin: warm and dry, no rashes noted Neuro: alert and oriented, no focal deficits  Assessment & Plan:    HYPERTENSION, BENIGN SYSTEMIC  BP today 168/69-28 on 2 rechecks. Patients systolic baseline appears to be 160's/170's and I think she is fine to stay there for now. No signs of end organ damage. Patient on 5 BP agents. Do not think adding another agent at this time  would be beneficial. BP elevated when she decreased hydralazine from TID to BID yesterday due to not feeling well yesterday morning. Feels improved today. Has appt with pharmacy clinic on Tuesday.  -advised patient to continue current regimen of norvasc, spironolactone, torsemide, losartan -advised patient to go back to taking hydralazine 3 times a day until she sees Dr. Valentina Lucks on Thursday -return/ED precautions given for CP, SOB, headache -patient verbalized understanding and agreement with plan   Return in 2 days (on 07/28/2017).   Lucila Maine, DO Family Medicine Resident PGY-2

## 2017-07-26 NOTE — Assessment & Plan Note (Addendum)
  BP today 168/69-28 on 2 rechecks. Patients systolic baseline appears to be 160's/170's and I think she is fine to stay there for now. No signs of end organ damage. Patient on 5 BP agents. Do not think adding another agent at this time would be beneficial. BP elevated when she decreased hydralazine from TID to BID yesterday due to not feeling well yesterday morning. Feels improved today. Has appt with pharmacy clinic on Tuesday.  -advised patient to continue current regimen of norvasc, spironolactone, torsemide, losartan -advised patient to go back to taking hydralazine 3 times a day until she sees Dr. Valentina Lucks on Thursday -return/ED precautions given for CP, SOB, headache -patient verbalized understanding and agreement with plan

## 2017-07-26 NOTE — Telephone Encounter (Signed)
**  After Hours/ Emergency Line Call*  Received a call to report that Rebecca Walter is having elevated blood pressures.  She reports that she took her Losartan 50 mg and her amlodipine 10 mg today, as well as her spironolactone and torsemide. She reports taking hydralazine 25 mg at 7 AM and at 9 PM.   - She reports checking her BP around 10:30 PM this evening and it was 202/86. - I asked her to recheck her BP while on the phone with me at midnight, and she said it was 197/86.  Her BP meds are currently being titrated by her providers at [facility] and she reports that she was meant to be titrating down her hydralazine which is why she only took it twice today.   - Denying chest pain, palpitations, headaches, blurry vision, numbness, or tingling.   - I recommended that patient come in to the ED to have her BP checked and to be given medication to control BP. I do not know how reliable her home BP cuff is/how reliable her measurements are, so I am concerned about her safety with titrating up BP medications with her at home overnight.   - I informed her of the risks of having elevated BP that is not controlled overnight.  - Patient reports that she does not want to come into the ED despite these risks.  - I stated that if she is not going to come into ED, she should check her BP and take her hydralazine 6 hours after her previous dose if BP remains elevateed SBP>150 (after discussing with pharmacy).  - I advised against taking extra norvasc or losartan overnight - Red flags were discussed, and patient states she will come in immediately for evaluation if she develops headaches, blurry vision, chest pain, or paresthesias. Will forward to PCP. - I was unable to schedule [facility] appointment for the patient tomorrow. She has follow up with Dr. Valentina Lucks on Thursday. She plans to call [facility] to see if a walk-in is available in the AM  Everrett Coombe, MD PGY-2, [facility]

## 2017-07-26 NOTE — Patient Instructions (Signed)
   Let's continue taking Hydralazine 3 times a day until you see Dr. Valentina Lucks. Continue with your other blood pressure medication.  If you have chest pain,headaches, or abnormal shortness of breath, please call us or go the ED.   If you have questions or concerns please do not hesitate to call at [phone].  Lucila Maine, DO PGY-2, [facility] 07/26/2017 11:03 AM

## 2017-07-27 ENCOUNTER — Ambulatory Visit: Payer: Medicare Other | Admitting: Internal Medicine

## 2017-07-27 DIAGNOSIS — I110 Hypertensive heart disease with heart failure: Secondary | ICD-10-CM | POA: Diagnosis not present

## 2017-07-27 DIAGNOSIS — K3184 Gastroparesis: Secondary | ICD-10-CM | POA: Diagnosis not present

## 2017-07-27 DIAGNOSIS — M150 Primary generalized (osteo)arthritis: Secondary | ICD-10-CM | POA: Diagnosis not present

## 2017-07-27 DIAGNOSIS — E1143 Type 2 diabetes mellitus with diabetic autonomic (poly)neuropathy: Secondary | ICD-10-CM | POA: Diagnosis not present

## 2017-07-27 DIAGNOSIS — I5032 Chronic diastolic (congestive) heart failure: Secondary | ICD-10-CM | POA: Diagnosis not present

## 2017-07-28 ENCOUNTER — Encounter: Payer: Self-pay | Admitting: Pharmacist

## 2017-07-28 ENCOUNTER — Other Ambulatory Visit: Payer: Self-pay | Admitting: Internal Medicine

## 2017-07-28 ENCOUNTER — Ambulatory Visit (INDEPENDENT_AMBULATORY_CARE_PROVIDER_SITE_OTHER): Payer: Medicare Other | Admitting: Pharmacist

## 2017-07-28 DIAGNOSIS — I10 Essential (primary) hypertension: Secondary | ICD-10-CM

## 2017-07-28 DIAGNOSIS — I5032 Chronic diastolic (congestive) heart failure: Secondary | ICD-10-CM

## 2017-07-28 MED ORDER — SPIRONOLACTONE 25 MG PO TABS: 25.0000 mg | ORAL_TABLET | Freq: Every day | ORAL | 2 refills | Status: DC

## 2017-07-28 MED ORDER — LOSARTAN POTASSIUM 100 MG PO TABS: 100.0000 mg | ORAL_TABLET | Freq: Every day | ORAL | 3 refills | Status: DC

## 2017-07-28 MED ORDER — TORSEMIDE 20 MG PO TABS: 20.0000 mg | ORAL_TABLET | Freq: Every day | ORAL | 5 refills | Status: DC

## 2017-07-28 NOTE — Assessment & Plan Note (Signed)
Hypertension longstanding currently uncontrolled on current medications. Upon conversation with Dr. Juleen China it appears that pt was supposed to increase torsemide to 40 mg daily x3 days, then return to 20 mg daily. Last BMET 07/15/17, Scr stable, K wnl. - Increased dose of losartan to 100 mg daily  - Increased dose of spironolactone to 25 mg daily.  - Asked pt to decrease torsemide back to 20 mg daily. - D/c hydralazine - Referral to cardiologist made by Dr. Juleen China, patient's PCP. (patient's son preferred Dr. Percival Spanish)  - BMET in 1 week.   Results reviewed and written information provided.   Total time in face-to-face counseling 35 minutes.   F/U by phone with Dr. Valentina Lucks early next week.

## 2017-07-28 NOTE — Progress Notes (Signed)
Patient ID: Rebecca Walter, female   DOB: 02/23/1941, 75 y.o.   MRN: [id number] Reviewed: Agree with Dr. Koval's documentation and management. 

## 2017-07-28 NOTE — Progress Notes (Signed)
Placed referral to cardiology for heart failure. Patient has requested Dr. Percival Spanish.   Phill Myron, D.O. 07/28/2017, 3:39 PM PGY-3, [place]

## 2017-07-28 NOTE — Progress Notes (Signed)
   S:    Patient arrives in fair spirits, notably anxious, ambulating with aid of a cane, accompanied by her son.  Presents to the clinic for hypertension evaluation. Patient was referred on 07/20/2017.  Patient was last seen by Primary Care Provider on 07/26/2017. Of note, pt had recent ED visit for elevated BP, SOB and dry cough on 07/17/2017.   Patient reports good diuresis with torsemide 40 mg (2x 20 mg tabs) daily. Reports that her weight is down and she has less LEE than previous. 1+ pitting edema noted bilaterally in ankles upon exam. Pt inquires if she should be seeing a cardiologist for her HF dx.   HR at home ~ 80 BPM per patient report. Reports when she was on carvedilol previously her HR was ~mid-70s. States that her BP is elevated in morning and evening while at home.   Patient reports adherence with medications. Does not have any carvedilol at home, has plenty of the losartan and spironolactone.  Current BP Medications include:  Losartan 50 mg daily, hydralazine 25 mg TID, spironolactone 12.5 mg daily, torsemide 40, amlodipine 10 mg daily mg daily  Antihypertensives tried in the past include: benazepril (d/c'd for cough), carvedilol (d/c'd for SOB, complicated by dx of diastolic heart failure)  Current diabetes medications include: metformin XR 500 mg BID, lantus 70 units daily, novolog 30-35 units with breakfast, 15-20 units with lunch, 20-25 units with supper.   Current CBGs per meter: 150s-250s, excursions to 300s  .[email or web address]   O:   Last 3 Office BP readings: BP Readings from Last 3 Encounters:  07/28/17 (!) 162/64  07/26/17 (!) 168/69  07/20/17 (!) 156/60    BMET    Component Value Date/Time   NA 140 07/15/2017 2000   NA 143 05/17/2017 1433   K 3.7 07/15/2017 2000   CL 99 (L) 07/15/2017 2000   CO2 29 07/15/2017 2000   GLUCOSE 240 (H) 07/15/2017 2000   BUN 13 07/15/2017 2000   BUN 14 05/17/2017 1433   CREATININE 0.95 07/15/2017 2000   CREATININE 0.93 09/16/2016  1410   CALCIUM 9.2 07/15/2017 2000   GFRNONAA 56 (L) 07/15/2017 2000   GFRNONAA 70 02/06/2015 1418   GFRAA >60 07/15/2017 2000   GFRAA 81 02/06/2015 1418    A/P: Hypertension longstanding currently uncontrolled on current medications. Upon conversation with Dr. Juleen China it appears that pt was supposed to increase torsemide to 40 mg daily x3 days, then return to 20 mg daily. Last BMET 07/15/17, Scr stable, K wnl. - Increased dose of losartan to 100 mg daily  - Increased dose of spironolactone to 25 mg daily.  - Asked pt to decrease torsemide back to 20 mg daily. - D/c hydralazine - Referral to cardiologist made by Dr. Juleen China, patient's PCP. (patient's son preferred Dr. Percival Spanish)  - BMET in 1 week.   Results reviewed and written information provided.   Total time in face-to-face counseling 35 minutes.   F/U by phone with Dr. Valentina Lucks early next week.   Patient seen with Cleotis Lema, PharmD Candidate, Charlene Brooke, PharmD PGY-1 Resident, and Deirdre Pippins, PharmD, PGY2 Resident.

## 2017-07-28 NOTE — Patient Instructions (Addendum)
Thank you for coming to see [place] today!   1. Take 1 full spironolactone pill (25 mg) once a day. 2. Take 2 losartan pills (100 mg total) once a day. 3. STOP taking hydralazine. 4. Decrease torsemide to 1 pill (20 mg) once a day in the mornings.   New prescriptions were sent to your pharmacy for the new doses of medicines. Use the pills that you have now and pick up the new ones when you are out.   I will call you on Monday to see how you are doing.   Come back to see [place] in 1 week. We will check labs then.

## 2017-08-01 ENCOUNTER — Telehealth: Payer: Self-pay | Admitting: Pharmacist

## 2017-08-01 NOTE — Telephone Encounter (Signed)
F/U of Blood Pressure Control.  Patient states she slept well last night.  Self-reported home BP readings are improved at 184/82, 176/78, and 180/82 with home device.   Patient reports she has NOT seen high or concerning elevated BPs.  She clarified she is taking losartan 100mg  (2 x 50mg ) and Sprionolactone 25mg  (full tablet) in addition to amlodipine 10mg .   She denied orthostasis.  She did report feelings of right sided breast discomfort which resolved throughout the day.  She did NOT think it was medication related.  She wondered if it was a "COLD" as she also has some symptoms of nasal congestion and fatigue.    She was offered a visit in office.  She declined and stated she was more comfortable with staying home and being evaluated by home health tomorrow.   We agreed I would call her in follow up on Wednesday.  Plan phone call follow-up on Wed.

## 2017-08-02 DIAGNOSIS — K3184 Gastroparesis: Secondary | ICD-10-CM | POA: Diagnosis not present

## 2017-08-02 DIAGNOSIS — M150 Primary generalized (osteo)arthritis: Secondary | ICD-10-CM | POA: Diagnosis not present

## 2017-08-02 DIAGNOSIS — I5032 Chronic diastolic (congestive) heart failure: Secondary | ICD-10-CM | POA: Diagnosis not present

## 2017-08-02 DIAGNOSIS — E1143 Type 2 diabetes mellitus with diabetic autonomic (poly)neuropathy: Secondary | ICD-10-CM | POA: Diagnosis not present

## 2017-08-02 DIAGNOSIS — I110 Hypertensive heart disease with heart failure: Secondary | ICD-10-CM | POA: Diagnosis not present

## 2017-08-03 ENCOUNTER — Telehealth: Payer: Self-pay | Admitting: Pharmacist

## 2017-08-03 NOTE — Telephone Encounter (Signed)
Called patient and she reported new onset of a stabbing pain that "went through to her back" today which has now resolved.  She shared that her blood pressures yesterday were 178/81 and then 148/78 when the nurse checked.     Her blood pressure readings today were 186/82 this AM  Her mid-day BP test today was 167/79 - she denies headache.   She is willing to wait until Monday to have her BP reevaluated at that time.  No changes in drug therapy for hypertension today.   Encouraged patient to follow up with our Office, Urgent Care or ER if the moderate/severe back pain recurrs.

## 2017-08-04 DIAGNOSIS — I5032 Chronic diastolic (congestive) heart failure: Secondary | ICD-10-CM | POA: Diagnosis not present

## 2017-08-04 DIAGNOSIS — E1143 Type 2 diabetes mellitus with diabetic autonomic (poly)neuropathy: Secondary | ICD-10-CM | POA: Diagnosis not present

## 2017-08-04 DIAGNOSIS — I110 Hypertensive heart disease with heart failure: Secondary | ICD-10-CM | POA: Diagnosis not present

## 2017-08-04 DIAGNOSIS — M150 Primary generalized (osteo)arthritis: Secondary | ICD-10-CM | POA: Diagnosis not present

## 2017-08-04 DIAGNOSIS — K3184 Gastroparesis: Secondary | ICD-10-CM | POA: Diagnosis not present

## 2017-08-04 NOTE — Telephone Encounter (Signed)
**  After Hours/ Emergency Line Call*  Received a call to report that Rebecca Walter is having elevated blood pressures at home.  Call came from [place], [place] with [facility] who was receiving after hours calls from the patient.  Per RN report, patient had called stating her BP aws 181/82, retook it and it was 200/80. RN recommended patient take her ativan, wait a while, and recheck BP. Patient calling to ask what to do if her BP recheck remains elevated.  Per report, patient endorsing no headaches or focal neurologic symptoms. Per phone note with our office yesterday, blood pressures have been elevated and medications are to remain the same until Monday. Recommended that if patient becomes symptomatic from elevated pressures, or if pressures remain severely elevated she come in to be evaluated. Red flags discussed.  RN asks for office visit tomorrow, patient with a lot of anxiety about waiting until Monday for her next office visit.   Made same day appointment for tomorrow (8/17) for BP check in the office given this was patient's request. She likely will require frequent reassurance during titration of her medications by pharmacy team and her PCP. Red flags discussed.  Will forward to PCP and resident to see Ms. Keithly tomorrow.  Everrett Coombe, MD PGY-2, [facility]

## 2017-08-05 ENCOUNTER — Encounter: Payer: Self-pay | Admitting: Family Medicine

## 2017-08-05 ENCOUNTER — Ambulatory Visit (INDEPENDENT_AMBULATORY_CARE_PROVIDER_SITE_OTHER): Payer: Medicare Other | Admitting: Family Medicine

## 2017-08-05 VITALS — BP 158/62 | HR 89 | Temp 98.5°F | Ht 60.0 in | Wt 201.6 lb

## 2017-08-05 DIAGNOSIS — I10 Essential (primary) hypertension: Secondary | ICD-10-CM | POA: Diagnosis not present

## 2017-08-05 NOTE — Assessment & Plan Note (Signed)
Blood pressure 158/62 today and patient is asymptomatic. Discussed with Dr. Valentina Lucks wishes to see patient on Monday, 08/08/2017 to further discuss medication changes. Had long discussion with patient about the concerning signs and symptoms associated with high blood pressure, including, but not limited to chest pain, shortness of breath, headache, changes in vision. - follow-up on 08/08/2017 with Dr. Valentina Lucks - Recommend the patient go to the emergency department over the weekend if she develops any of the above symptoms

## 2017-08-05 NOTE — Patient Instructions (Signed)
Rebecca Walter, you were seen today for blood pressure follow-up. Fortunately her blood pressure today was 158/62. I'm glad to hear the having any chest pain, shortness of breath, changes in vision or headaches.   I discussed with Dr. Valentina Lucks who agrees that following up on Monday to adjust her blood pressure medicines would be the best approach.  Over the weekend if you do have elevated blood pressure as well as severe headache, changes in vision, chest pain or shortness of breath please go to the emergency department.  Very nice seeing you today, Rebecca Walter L. Rosalyn Gess, [place] Resident PGY-2 08/05/2017 3:31 PM

## 2017-08-05 NOTE — Progress Notes (Signed)
    Subjective:  Rebecca Walter is a 76 y.o. female who presents to the [facility] today with a chief complaint of blood pressure follow up.   HPI:  Patient called after hours line last night reporting blood pressure of 200/80 however denied any chest pain, shortness of breath, changes in vision or severe headache. Reportedly her RN from [facility] recommended patient take her Ativan wait a while and recheck blood pressure. Blood pressure came down and patient remained asymptomatic. RN requested same-day visit for today.  Patient reported to me that she did have pleural effusion in her left eye during the time that she had this elevated blood pressure last night, but denied any chest pain, shortness of breath or severe headache.   Blood pressure today in the office 158/62. Patient denies any chest pain, shortness of breath, headache, dizziness or visual changes. Feels that her blood pressure tends to spike in the evening because she takes all her medications in the morning  Tobacco use reviewed Medication: reviewed and updated ROS: see HPI   Objective:  Physical Exam: BP (!) 158/62   Pulse 89   Temp 98.5 F (36.9 C) (Oral)   Ht 5' (1.524 m)   Wt 201 lb 9.6 oz (91.4 kg)   SpO2 94%   BMI 39.37 kg/m   Gen: 76 year old female inNAD, resting comfortably, but appears anxious CV: RRR with no murmurs appreciated Pulm: NWOB, CTAB with no crackles, wheezes, or rhonchi GI: Normal bowel sounds present. Soft, Nontender, Nondistended. MSK: no edema, cyanosis, or clubbing noted Skin: warm, dry Neuro: grossly normal, moves all extremities Psych: Normal affect and thought content  No results found for this or any previous visit (from the past 72 hour(s)).   Assessment/Plan:  HYPERTENSION, BENIGN SYSTEMIC Blood pressure 158/62 today and patient is asymptomatic. Discussed with Dr. Valentina Lucks wishes to see patient on Monday, 08/08/2017 to further discuss medication changes. Had long discussion with  patient about the concerning signs and symptoms associated with high blood pressure, including, but not limited to chest pain, shortness of breath, headache, changes in vision. - follow-up on 08/08/2017 with Dr. Valentina Lucks - Recommend the patient go to the emergency department over the weekend if she develops any of the above symptoms

## 2017-08-08 ENCOUNTER — Encounter: Payer: Self-pay | Admitting: Pharmacist

## 2017-08-08 ENCOUNTER — Ambulatory Visit (INDEPENDENT_AMBULATORY_CARE_PROVIDER_SITE_OTHER): Payer: Medicare Other | Admitting: Pharmacist

## 2017-08-08 DIAGNOSIS — E118 Type 2 diabetes mellitus with unspecified complications: Secondary | ICD-10-CM | POA: Diagnosis not present

## 2017-08-08 DIAGNOSIS — Z794 Long term (current) use of insulin: Secondary | ICD-10-CM | POA: Diagnosis not present

## 2017-08-08 DIAGNOSIS — I10 Essential (primary) hypertension: Secondary | ICD-10-CM | POA: Diagnosis not present

## 2017-08-08 MED ORDER — SPIRONOLACTONE 25 MG PO TABS: 25.0000 mg | ORAL_TABLET | Freq: Every day | ORAL | 2 refills | Status: DC

## 2017-08-08 MED ORDER — AMLODIPINE BESYLATE 10 MG PO TABS: 10.0000 mg | ORAL_TABLET | Freq: Every day | ORAL | 3 refills | Status: DC

## 2017-08-08 MED ORDER — CANDESARTAN CILEXETIL 32 MG PO TABS: 32.0000 mg | ORAL_TABLET | Freq: Every day | ORAL | 1 refills | Status: DC

## 2017-08-08 NOTE — Assessment & Plan Note (Signed)
Hypertension longstanding currently uncontrolled on current medications. Blood pressure readings have improved since last visit. Patient is experiencing new onset diarrhea, which may be contributed to by the increased dose of losartan.  -Discontinued losartan 100 mg daily -Initiated candesartan 32 mg daily. Counseled patient to take this medication at night, starting tomorrow (8/21)  -Continued all other blood pressure medications  -Sent refills to patient's pharmacy for spironolactone 25 mg and amlodipine 10 mg

## 2017-08-08 NOTE — Progress Notes (Signed)
   S:    Patient arrives in fair spirits, ambulating with cane, accompanied by son. Presents to the clinic for hypertension evaluation. Patient was referred on 07/20/17.  Patient was last seen by Primary Care Provider on 08/05/17. Patient's home blood pressure meter shows blood pressure readings ranging from 160s/60s to 200s/90s. Patient denies feeling light headed when getting up from sitting or lying. Patient is still waiting to see a cardiologist.    Patient reports adherence with medications and she takes all of her blood pressure medications in the morning. She reports diarrhea, starting 2-3 days ago, with increased dose of losartan.  She requested change in losartan therapy due to this diarrhea.   Current BP Medications include:  Amlodipine 10 mg daily, losartan 100 mg daily, spironolactone 25 mg daily, torsemide 20 mg daily  O:  Obtained blood pressure readings using the automatic cuff (165/62), manually (152/64), and using patient's meter (164/78).  Last 3 Office BP readings: BP Readings from Last 3 Encounters:  08/08/17 (!) 164/78  08/05/17 (!) 158/62  07/28/17 (!) 162/64    BMET    Component Value Date/Time   NA 140 07/15/2017 2000   NA 143 05/17/2017 1433   K 3.7 07/15/2017 2000   CL 99 (L) 07/15/2017 2000   CO2 29 07/15/2017 2000   GLUCOSE 240 (H) 07/15/2017 2000   BUN 13 07/15/2017 2000   BUN 14 05/17/2017 1433   CREATININE 0.95 07/15/2017 2000   CREATININE 0.93 09/16/2016 1410   CALCIUM 9.2 07/15/2017 2000   GFRNONAA 56 (L) 07/15/2017 2000   GFRNONAA 70 02/06/2015 1418   GFRAA >60 07/15/2017 2000   GFRAA 81 02/06/2015 1418    A/P: Hypertension longstanding currently uncontrolled on current medications. Blood pressure readings have improved since last visit. Patient is experiencing new onset diarrhea, which may be contributed to by the increased dose of losartan.  -Discontinued losartan 100 mg daily -Initiated candesartan 32 mg daily. Counseled patient to take this  medication at night, starting tomorrow (8/21)  -Continued all other blood pressure medications  -Sent refills to patient's pharmacy for spironolactone 25 mg and amlodipine 10 mg  Results reviewed and written information provided.   Total time in face-to-face counseling 30 minutes.   F/U RxClinic visit in 1 week.  Patient seen with Cleotis Lema, PharmD Candidate, and Charlene Brooke, PharmD PGY-1 Resident.

## 2017-08-08 NOTE — Patient Instructions (Addendum)
Thank you for coming in today!  Stop taking losartan. Start Candesartan 32 mg once daily at bedtime starting Tuesday night (08/09/17)  Continue all other blood pressure medications as you were taking them before.  Return to [facility] next week.

## 2017-08-09 NOTE — Progress Notes (Signed)
Patient ID: Rebecca Walter, female   DOB: 08/06/1941, 75 y.o.   MRN: [id number] Reviewed: Agree with Dr. Koval's documentation and management. 

## 2017-08-10 ENCOUNTER — Telehealth: Payer: Self-pay | Admitting: Internal Medicine

## 2017-08-10 NOTE — Telephone Encounter (Signed)
Pt called and would like for Dr. Valentina Lucks to call her concerning one of her new medications that she is taking at bedtime it is Candesartan. Please call to discuss. jw

## 2017-08-11 ENCOUNTER — Telehealth: Payer: Self-pay | Admitting: Internal Medicine

## 2017-08-11 ENCOUNTER — Telehealth: Payer: Self-pay | Admitting: Pharmacist

## 2017-08-11 DIAGNOSIS — E1143 Type 2 diabetes mellitus with diabetic autonomic (poly)neuropathy: Secondary | ICD-10-CM | POA: Diagnosis not present

## 2017-08-11 DIAGNOSIS — K3184 Gastroparesis: Secondary | ICD-10-CM | POA: Diagnosis not present

## 2017-08-11 DIAGNOSIS — M150 Primary generalized (osteo)arthritis: Secondary | ICD-10-CM | POA: Diagnosis not present

## 2017-08-11 DIAGNOSIS — I5032 Chronic diastolic (congestive) heart failure: Secondary | ICD-10-CM | POA: Diagnosis not present

## 2017-08-11 DIAGNOSIS — I110 Hypertensive heart disease with heart failure: Secondary | ICD-10-CM | POA: Diagnosis not present

## 2017-08-11 NOTE — Telephone Encounter (Signed)
Pt wants to talk to dr Valentina Lucks about her blood medication.

## 2017-08-11 NOTE — Telephone Encounter (Signed)
Follow up phone call returned to reevaluate Blood Pressure results after switch from losartan to candesartan.   Patient noticed no side effects with change and report blood pressures in the AM of 170/80 yesterday AM and 169/81 this AM.   She reported her evening blood pressure last night was 178/85 and she felt "bad".    Following brief discussion of options we decided together that follow-up by phone on Monday AM (Four days from now) would allow [place] to consider new medication (clonidine) at that time.  She plans to follow up in Rx Clinic in 1 week.

## 2017-08-15 ENCOUNTER — Ambulatory Visit: Payer: Medicare Other | Admitting: Pharmacist

## 2017-08-15 ENCOUNTER — Telehealth: Payer: Self-pay | Admitting: Pharmacist

## 2017-08-15 MED ORDER — CLONIDINE HCL 0.1 MG PO TABS: 0.1000 mg | ORAL_TABLET | Freq: Two times a day (BID) | ORAL | 1 refills | Status: DC

## 2017-08-15 NOTE — Telephone Encounter (Signed)
Phone Call F/U of Blood Pressue following change - of losartanto candesartan 1 week ago.  Patient denied GI symptoms following d/c of losartan.   Appears to be tolerating candesartan without any new complaints.   Home BP readings on her device reported as:   Systolic ranges 741-638  Diastolic ranges 45-36  [facility] measured BP at 134/70 at her visit.   Patient reported home BP monitor is being replaced with a new meter in the next several days.     She denies any symptoms of orthostasis.  Willing to attempt a trial of low dose clonidine 0.1mg  twice daily in addition to current regimen; amlodipine 10mg  daily, candesartan 32mg  at bedtime, spironolactone 25mg  daily  and torsemide 20mg  daily.  Follow up in Rx Clinic - 3 days (Thursday).

## 2017-08-16 ENCOUNTER — Telehealth: Payer: Self-pay | Admitting: Internal Medicine

## 2017-08-16 NOTE — Telephone Encounter (Signed)
Pt took clonadine yesterday about 3:30 after she had eaten.  Afterwards she got deathly ill--sick on her stomach.   She didn't take her blood pressure. She would like to talk to dr Valentina Lucks before taking any more.

## 2017-08-16 NOTE — Telephone Encounter (Signed)
Pt called back because dr Valentina Lucks hasnt called her back. She asked for the phone note to be sent to dr wallace.

## 2017-08-16 NOTE — Telephone Encounter (Signed)
Pt called again about her blood pressure medication. She is very concerned

## 2017-08-16 NOTE — Telephone Encounter (Signed)
Called in follow-up of recent initiation of clonidine.  Patient reported GI distress follwoing use - potentially it could have been food as well.  She did get a new arm BP monitor  readings are: 152/70, 165/62.   She denies current symptoms of being too low or high.   Denies lightheadedness or headache.  Told to NOT take clonidine at this time.  Take all other BP meds as taken in the past.   Follow up plan agreed on was to follow up in Rx Clinic on Thursday as previously scheduled AND we will evaluate more home and in office readings at that time.  She was accepting of this plan and felt at ease following the phone call.

## 2017-08-17 DIAGNOSIS — M150 Primary generalized (osteo)arthritis: Secondary | ICD-10-CM | POA: Diagnosis not present

## 2017-08-17 DIAGNOSIS — I5032 Chronic diastolic (congestive) heart failure: Secondary | ICD-10-CM | POA: Diagnosis not present

## 2017-08-17 DIAGNOSIS — E1143 Type 2 diabetes mellitus with diabetic autonomic (poly)neuropathy: Secondary | ICD-10-CM | POA: Diagnosis not present

## 2017-08-17 DIAGNOSIS — I110 Hypertensive heart disease with heart failure: Secondary | ICD-10-CM | POA: Diagnosis not present

## 2017-08-17 DIAGNOSIS — K3184 Gastroparesis: Secondary | ICD-10-CM | POA: Diagnosis not present

## 2017-08-18 ENCOUNTER — Encounter: Payer: Self-pay | Admitting: Pharmacist

## 2017-08-18 ENCOUNTER — Ambulatory Visit (INDEPENDENT_AMBULATORY_CARE_PROVIDER_SITE_OTHER): Payer: Medicare Other | Admitting: Pharmacist

## 2017-08-18 DIAGNOSIS — I10 Essential (primary) hypertension: Secondary | ICD-10-CM | POA: Diagnosis not present

## 2017-08-18 NOTE — Progress Notes (Signed)
   S:    Patient arrives in good spirits, ambulating with assistance of a cane, accompanied by son, Merry Proud.    Presents to the clinic for hypertension evaluation. Patient was referred on 07/20/17.  Patient was last seen by Primary Care Provider on 08/05/17. Has pending appointment with cardiology next week.   Got new BP monitor in home, readings ~ 158/59, 169/68, 167/65, 184/74.  Patient reports that she has felt like her heart has been racing but HR was in 80s at that time.   Patient denies feeling light headed when getting up from sitting or lying.   Patient reports adherence with medications. Blood sugars have been OK, no reported lows   Current BP Medications include:  Amlodipine 10 mg daily, candesartan 32 mg daily, spironolactone 25 mg daily, torsemide 20 mg daily  Antihypertensives tried in the past include: hydralazine (felt bad), clonidine (GI upset) with single dose earlier this week, losartan (GI upset), benazepril (d/c'd for cough), carvedilol (d/c'd for SOB, complicated by dx of diastolic heart failure)  O:   Last 3 Office BP readings: BP Readings from Last 3 Encounters:  08/18/17 (!) 167/58  08/08/17 (!) 164/78  08/05/17 (!) 158/62    BMET    Component Value Date/Time   NA 140 07/15/2017 2000   NA 143 05/17/2017 1433   K 3.7 07/15/2017 2000   CL 99 (L) 07/15/2017 2000   CO2 29 07/15/2017 2000   GLUCOSE 240 (H) 07/15/2017 2000   BUN 13 07/15/2017 2000   BUN 14 05/17/2017 1433   CREATININE 0.95 07/15/2017 2000   CREATININE 0.93 09/16/2016 1410   CALCIUM 9.2 07/15/2017 2000   GFRNONAA 56 (L) 07/15/2017 2000   GFRNONAA 70 02/06/2015 1418   GFRAA >60 07/15/2017 2000   GFRAA 81 02/06/2015 1418    A/P: Hypertension longstanding currently likely near her personal goal with isolated systolic hypertension on current medications. Discontinued clonidine after phone call two days ago  2/2 intolerance. Therapeutic options are limited by intolerances, wide pulse pressure, and  DBP borderline low.  I believe she is tolerating current therapy, has elevated BP when anxious and we discussed monitoring less to avoid becoming alarmed with modestly elevated systolic readings.  I believe her goal for systolic BP may be ~ 330 given her low diastolic readings and symptoms of orthostasis currently.  -No changes today, defer further management to cardiology.  Results reviewed and written information provided.   Total time in face-to-face counseling 30 minutes.   F/U Clinic Visit with Dr. Juleen China after visiting cardiologist.  Patient seen with Cleotis Lema, PharmD Candidate, and Deirdre Pippins, PharmD PGY-2 Resident.

## 2017-08-18 NOTE — Assessment & Plan Note (Signed)
Hypertension longstanding currently likely near her personal goal with isolated systolic hypertension on current medications. Discontinued clonidine after phone call two days ago  2/2 intolerance. Therapeutic options are limited by intolerances, wide pulse pressure, and DBP borderline low.  I believe she is tolerating current therapy, has elevated BP when anxious and we discussed monitoring less to avoid becoming alarmed with modestly elevated systolic readings.  I believe her goal for systolic BP may be ~ 403 given her low diastolic readings and symptoms of orthostasis currently.  -No changes today, defer further management to cardiology.

## 2017-08-18 NOTE — Patient Instructions (Addendum)
Thanks for coming to see [place] today!   1. Continue all of your current blood pressure medications  2. Your bottom blood pressure number (diastolic blood pressure) can't get too much lower so we are not going to change medications right now.   3. We will let your cardiologist decide where to go from here.   4. When getting up from sitting or lying, go slowly!  Make an appointment with Dr. Juleen China a few weeks after you see your cardiologist. You can come see me as you need and are welcome to continue calling me as needed.

## 2017-08-19 DIAGNOSIS — M150 Primary generalized (osteo)arthritis: Secondary | ICD-10-CM | POA: Diagnosis not present

## 2017-08-19 DIAGNOSIS — I5032 Chronic diastolic (congestive) heart failure: Secondary | ICD-10-CM | POA: Diagnosis not present

## 2017-08-19 DIAGNOSIS — I110 Hypertensive heart disease with heart failure: Secondary | ICD-10-CM | POA: Diagnosis not present

## 2017-08-19 DIAGNOSIS — E1143 Type 2 diabetes mellitus with diabetic autonomic (poly)neuropathy: Secondary | ICD-10-CM | POA: Diagnosis not present

## 2017-08-19 DIAGNOSIS — K3184 Gastroparesis: Secondary | ICD-10-CM | POA: Diagnosis not present

## 2017-08-19 NOTE — Progress Notes (Signed)
Patient ID: Rebecca Walter, female   DOB: 1941/09/03, 76 y.o.   MRN: [id number] Reviewed: Agree with Dr. Graylin Shiver documentation and management.

## 2017-08-23 DIAGNOSIS — E1143 Type 2 diabetes mellitus with diabetic autonomic (poly)neuropathy: Secondary | ICD-10-CM | POA: Diagnosis not present

## 2017-08-23 DIAGNOSIS — I5032 Chronic diastolic (congestive) heart failure: Secondary | ICD-10-CM | POA: Diagnosis not present

## 2017-08-23 DIAGNOSIS — I110 Hypertensive heart disease with heart failure: Secondary | ICD-10-CM | POA: Diagnosis not present

## 2017-08-23 DIAGNOSIS — K3184 Gastroparesis: Secondary | ICD-10-CM | POA: Diagnosis not present

## 2017-08-23 DIAGNOSIS — M150 Primary generalized (osteo)arthritis: Secondary | ICD-10-CM | POA: Diagnosis not present

## 2017-08-24 ENCOUNTER — Ambulatory Visit: Payer: Medicare Other | Admitting: Physician Assistant

## 2017-08-26 ENCOUNTER — Ambulatory Visit (INDEPENDENT_AMBULATORY_CARE_PROVIDER_SITE_OTHER): Payer: Medicare Other | Admitting: Physician Assistant

## 2017-08-26 ENCOUNTER — Encounter: Payer: Self-pay | Admitting: Physician Assistant

## 2017-08-26 VITALS — BP 160/64 | HR 90 | Ht 60.0 in | Wt 201.4 lb

## 2017-08-26 DIAGNOSIS — I350 Nonrheumatic aortic (valve) stenosis: Secondary | ICD-10-CM

## 2017-08-26 DIAGNOSIS — I110 Hypertensive heart disease with heart failure: Secondary | ICD-10-CM

## 2017-08-26 DIAGNOSIS — I340 Nonrheumatic mitral (valve) insufficiency: Secondary | ICD-10-CM

## 2017-08-26 DIAGNOSIS — R9431 Abnormal electrocardiogram [ECG] [EKG]: Secondary | ICD-10-CM

## 2017-08-26 DIAGNOSIS — I5032 Chronic diastolic (congestive) heart failure: Secondary | ICD-10-CM | POA: Diagnosis not present

## 2017-08-26 DIAGNOSIS — I371 Nonrheumatic pulmonary valve insufficiency: Secondary | ICD-10-CM | POA: Diagnosis not present

## 2017-08-26 DIAGNOSIS — I10 Essential (primary) hypertension: Secondary | ICD-10-CM

## 2017-08-26 NOTE — Patient Instructions (Addendum)
Medication Instructions:  Your physician recommends that you continue on your current medications as directed. Please refer to the Current Medication list given to you today.   Labwork: TODAY:  BMET & TSH  Testing/Procedures: Your physician has requested that you have a lexiscan myoview. For further information please visit [email or web address]. Please follow instruction sheet, as given.  Your physician has recommended that you have a sleep study. This test records several body functions during sleep, including: brain activity, eye movement, oxygen and carbon dioxide blood levels, heart rate and rhythm, breathing rate and rhythm, the flow of air through your mouth and nose, snoring, body muscle movements, and chest and belly movement.    Follow-Up: Your physician recommends that you schedule a follow-up appointment in: [place] DR. ROSS    Any Other Special Instructions Will Be Listed Below (If Applicable).  Regadenoson injection What is this medicine? REGADENOSON is used to test the heart for coronary artery disease. It is used in patients who can not exercise for their stress test. This medicine may be used for other purposes; ask your health care provider or pharmacist if you have questions. COMMON BRAND NAME(S): Lexiscan What should I tell my health care provider before I take this medicine? They need to know if you have any of these conditions: -heart problems -lung or breathing disease, like asthma or COPD -an unusual or allergic reaction to regadenoson, other medicines, foods, dyes, or preservatives -pregnant or trying to get pregnant -breast-feeding How should I use this medicine? This medicine is for injection into a vein. It is given by a health care professional in a hospital or clinic setting. Talk to your pediatrician regarding the use of this medicine in children. Special care may be needed. Overdosage: If you think you have taken too much of this medicine contact  a poison control center or emergency room at once. NOTE: This medicine is only for you. Do not share this medicine with others. What if I miss a dose? This does not apply. What may interact with this medicine? -caffeine -dipyridamole -guarana -theophylline This list may not describe all possible interactions. Give your health care provider a list of all the medicines, herbs, non-prescription drugs, or dietary supplements you use. Also tell them if you smoke, drink alcohol, or use illegal drugs. Some items may interact with your medicine. What should I watch for while using this medicine? Your condition will be monitored carefully while you are receiving this medicine. Do not take medicines, foods, or drinks with caffeine (like coffee, tea, or colas) for at least 12 hours before your test. If you do not know if something contains caffeine, ask your health care professional. What side effects may I notice from receiving this medicine? Side effects that you should report to your doctor or health care professional as soon as possible: -allergic reactions like skin rash, itching or hives, swelling of the face, lips, or tongue -breathing problems -chest pain, tightness or palpitations -severe headache Side effects that usually do not require medical attention (report to your doctor or health care professional if they continue or are bothersome): -flushing -headache -irritation or pain at site where injected -nausea, vomiting This list may not describe all possible side effects. Call your doctor for medical advice about side effects. You may report side effects to FDA at 1-800-FDA-1088. Where should I keep my medicine? This drug is given in a hospital or clinic and will not be stored at home. NOTE: This sheet is a summary. It  may not cover all possible information. If you have questions about this medicine, talk to your doctor, pharmacist, or health care provider.  2018 Elsevier/Gold Standard  (2008-08-05 15:08:13)   Sleep Studies A sleep study (polysomnogram) is a series of tests done while you are sleeping. It can show how well you sleep. This can help your health care provider diagnose a sleep disorder and show how severe your sleep disorder is. A sleep study may lead to treatment that will help you sleep better and prevent other medical problems caused by poor sleep. If you have a sleep disorder, you may also be at risk for:  Sleep-related accidents.  High blood pressure.  Heart disease.  Stroke.  Other medical conditions.  Sleep disorders are common. Your health care provider may suspect a sleep disorder if you:  Have loud snoring most nights.  Have brief periods when you stop breathing at night.  Feel sleepy on most days.  Fall asleep suddenly during the day.  Have trouble falling asleep or staying asleep.  Feel like you need to move your legs when trying to fall asleep.  Have dreams that seem very real shortly after falling asleep.  Feel like you cannot move when you first wake up.  Which tests will I need to have? Most sleep studies last all night and include these tests:  Recordings of your brain activity.  Recordings of your eye movements.  Recording of your heart rate and rhythm.  Blood pressure readings.  Readings of the amount of oxygen in your blood.  Measurements of your chest and belly movement as you breathe during sleep.  If you have signs of the sleep disorder called sleep apnea during your test, you may get a mask to wear for the second half of the night.  The mask provides continuous positive airway pressure (CPAP). This may improve sleep apnea significantly.  You will then have all tests done again with the mask in place to see if your measurements and recordings change.  How are sleep studies done? Most sleep studies are done over one full night of sleep.  You will arrive at the study center in the evening and can go home in  the morning.  Bring your pajamas and toothbrush.  Do not have caffeine on the day of your sleep study.  Your health care provider will let you know if you need to stop taking any of your regular medicines before the test.  To do the tests included in a polysomnogram, you will have:  Round, sticky patches with sensors attached to recording wires (electrodes) placed on your scalp, face, chest, and limbs.  Wires from all the electrodes and sensors run from your bed to a computer. The wires can be taken off and put back on if you need to get out of bed to go to the bathroom.  A sensor placed over your nose to measure airflow.  A finger clip put on one finger to measure your blood oxygen level.  A belt around your belly and a belt around your chest to measure breathing movements.  Where are sleep studies done? Sleep studies are done at sleep centers. A sleep center may be inside a hospital, office, or clinic. The room where you have the study may look like a hospital room or a hotel room. The health care providers doing the study may come in and out of the room during the study. Most of the time, they will be in another room monitoring  your test. How is information from sleep studies helpful? A polysomnogram can be used along with your medical history and a physical exam to diagnose conditions, such as:  Sleep apnea.  Restless legs syndrome.  Sleep-related seizure disorders.  Sleep-related movement disorders.  A medical doctor who specializes in sleep will evaluate your sleep study. The specialist will share the results with your primary health care provider. Treatments based on your sleep study may include:  Improving your sleep habits (sleep hygiene).  Wearing a CPAP mask.  Wearing an oral device at night to improve breathing and reduce snoring.  Taking medicine for: ? Restless legs syndrome. ? Sleep-related seizure disorder. ? Sleep-related movement disorder.  This  information is not intended to replace advice given to you by your health care provider. Make sure you discuss any questions you have with your health care provider. Document Released: 06/12/2003 Document Revised: 08/01/2016 Document Reviewed: 02/11/2014 Elsevier Interactive Patient Education  [employer].  If you need a refill on your cardiac medications before your next appointment, please call your pharmacy.

## 2017-08-26 NOTE — Progress Notes (Signed)
Cardiology Office Note    Date:  08/26/2017  ID:  Rebecca Walter, DOB July 30, 1941, MRN [id number] PCP:  Nicolette Bang, DO  Cardiologist:  New, reviewed with Dr. Harrington Challenger   Chief Complaint: evaluate high blood pressure  History of Present Illness:  Rebecca Walter is a 76 y.o. female with history of HTN, HLD, DM, GERD, gout, anxiety, chronic diastolic CHF, mild AI/MR, moderate PR by echo 06/2017 who is being seen today for the evaluation of HTN and chronic diastolic CHF at the request of Dr. Juleen China.  She reports remote workup by Dr. Ron Parker >10 years ago with stress test. I do not have those results but she states workup was normal. She says she's always been told she has an abnormal EKG. She did not recall formal prior dx of CHF but has been taking fluid pills for a while to control edema and BP. She was recently admitted 06/2017 with headache, SOB, cough and hypertensive emergency with SBP to 260s. She did not want to take her benazepril and had not been taking Coreg. She was discharged on carvedilol, torsemide and spironoloactone. She had mild troponin elevation 0.14 felt 2/2 demand ischemia from HTN. 2D Echo showed mild LVH, EF 60-65%, grade 1 DD, mild AI/MR, mod PR, PASP 43mmHg. Labs otherwise triglycerides 249,  LDL 80, BNP 69, K 3.7, Cr 0.95.  She stopped her carvedilol due to shortness of breath. She was started on clonidine. However, per note from primary care pharmacy HTN clinic, "Discontinued clonidine after phone call two days ago  2/2 intolerance. Therapeutic options are limited by intolerances, wide pulse pressure, and DBP borderline low.  I believe she is tolerating current therapy, has elevated BP when anxious and we discussed monitoring less to avoid becoming alarmed with modestly elevated systolic readings.  I believe her goal for systolic BP may be ~ 409 given her low diastolic readings and symptoms of orthostasis currently." Prior intolerances noted to lotensin (dry cough),  losartan (diarrhea), metoprolol (coughing), and hydrochlorothiazide (gout).   She presents for first visit today with her son. Blood pressure was 160/64 at home today. She was 160/70 in the left arm and 150/100 in the right arm. She reports that about 1-2 times a week she will have fleeting chest pains in her upper chest lasting just a few seconds, associated with feeling her heart beat harder, improved by turning over in bed. She has not had any other sustained palpitations or other chest pain. She has chronic DOE which is stable for her. She is sedentary and walks with a cane due to knee issues. An eye doctor told her in the past during surgery that he was concerned for OSA due to snoring. Sleep study was recommended but she never went for this. Her lower extremity edema is well controlled with diuretic therapy and compression hose. She tries to watch her sodium. Brother had CABG in his early 60s, another brother with valve replacement, and mother had "angina."   Past Medical History:  Diagnosis Date  . Anxiety   . Arthritis   . Chronic diastolic CHF (congestive heart failure) ([place])   . Diabetes mellitus   . GERD (gastroesophageal reflux disease)   . Gout   . Hyperlipidemia   . Hypertension   . Hypertensive heart disease with CHF (congestive heart failure) ([place])   . Mild aortic stenosis 06/2017  . Mild mitral regurgitation 06/2017  . Moderate pulmonary valve insufficiency 06/2017  . Osteopenia     Past Surgical  History:  Procedure Laterality Date  . KIDNEY SURGERY    . KNEE SURGERY    . MENISECTOMY    . WRIST SURGERY      Current Medications: Current Meds  Medication Sig  . allopurinol (ZYLOPRIM) 100 MG tablet TAKE 2 TABLETS BY MOUTH EVERY DAY  . alum & mag hydroxide-simeth (MAALOX/MYLANTA) 200-200-20 MG/5ML suspension Take 30 mLs by mouth every 6 (six) hours as needed for indigestion or heartburn.  Marland Kitchen amLODipine (NORVASC) 10 MG tablet Take 1 tablet (10 mg total) by mouth daily.    Marland Kitchen aspirin 81 MG chewable tablet Chew 1 tablet (81 mg total) by mouth daily.  . B-D ULTRAFINE III SHORT PEN 31G X 8 MM MISC USE AS DIRECTED  . baclofen (LIORESAL) 10 MG tablet TAKE 1 TABLET BY MOUTH 3 TIMES A DAY AS NEEDED FOR MUSCLE SPASMS  . candesartan (ATACAND) 32 MG tablet Take 1 tablet (32 mg total) by mouth at bedtime.  Marland Kitchen glucose blood (ACCU-CHEK AVIVA PLUS) test strip CHECK BLOOD SUGAR FIRST  THING IN THE MORNING BEFORE EATING, AFTER LUNCH, AND  AFTER DINNER TOTAL 3 TIMES  DAILY. ICD 10-code: E11.9  . insulin aspart (NOVOLOG FLEXPEN) 100 UNIT/ML FlexPen - Before breakfast: Take 30 units if your blood sugar is 80-180, if it is > 180 take 35 units - Before lunch: Take 15 units if your blood sugar is 80-180, if it is > 180 take 20 units - Before dinner: Take 20 units if your blood sugar is 80-180, if it is > 180 take 25 units  . LANTUS SOLOSTAR 100 UNIT/ML Solostar Pen INJECT 70 UNITS INTO THE SKIN EVERY MORNING.  Marland Kitchen loratadine (CLARITIN) 10 MG tablet Take 10 mg by mouth daily.  Marland Kitchen LORazepam (ATIVAN) 1 MG tablet Take 1 tablet (1 mg total) by mouth 2 (two) times daily as needed for anxiety.  . lovastatin (MEVACOR) 40 MG tablet Take 80 mg by mouth 2 (two) times daily.  . metFORMIN (GLUCOPHAGE-XR) 500 MG 24 hr tablet TAKE 1 TABLET BY MOUTH 2 TIMES A DAY BEFORE MEALS  . Multiple Vitamin (MULTIVITAMIN) tablet Take 1 tablet by mouth daily.    . pantoprazole (PROTONIX) 40 MG tablet Take 40 mg by mouth 2 (two) times daily.   . Probiotic Product (PROBIOTIC PEARLS) CAPS Take 1 capsule by mouth once.   . ranitidine (ZANTAC) 150 MG tablet Take 150 mg by mouth 2 (two) times daily as needed.   . senna (SENOKOT) 8.6 MG TABS tablet Take 1 tablet by mouth at bedtime as needed for mild constipation.  Marland Kitchen spironolactone (ALDACTONE) 25 MG tablet Take 1 tablet (25 mg total) by mouth daily.  Marland Kitchen torsemide (DEMADEX) 20 MG tablet Take 1 tablet (20 mg total) by mouth daily.  . vitamin E (NATURAL MIXED TOCOPHEROLS) 400  UNIT capsule Take 400 Units by mouth daily.   . [DISCONTINUED] cloNIDine (CATAPRES) 0.1 MG tablet Take 1 tablet (0.1 mg total) by mouth 2 (two) times daily.  . [DISCONTINUED] lovastatin (MEVACOR) 40 MG tablet TAKE 2 TABLETS BY MOUTH EVERY DAY (Patient taking differently: take 40 mg by mouth daily)  . [DISCONTINUED] senna-docusate (SENOKOT-S) 8.6-50 MG tablet Take 1 tablet by mouth 2 (two) times daily. (Patient taking differently: Take 1 tablet by mouth at bedtime as needed. )     Allergies:   Hctz [hydrochlorothiazide]; Lotensin [benazepril]; Victoza [liraglutide]; Acyclovir and related; Beta adrenergic blockers; and Losartan   Social History   Social History  . Marital status: Widowed  Spouse name: N/A  . Number of children: 1  . Years of education: 47   Occupational History  . retired-office work    Social History Main Topics  . Smoking status: Never Smoker  . Smokeless tobacco: Never Used  . Alcohol use No  . Drug use: No  . Sexual activity: Not Asked   Other Topics Concern  . None   Social History Narrative   Health Care POA:    Emergency Contact:    End of Life Plan:    Who lives with you: self   Any pets: none   Diet: Pt has a varied diet of protein, starch, and vegetables.   Exercise: Pt reports walking daily to community trash cans.    Seatbelts: Pt reports wearing seatbelt when in vehicles.    Hobbies: growing flowers           Family History:  Family History  Problem Relation Age of Onset  . Stroke Mother   . Heart disease Father   . Stroke Father   . Hypertension Father   . Cancer Sister   . Cancer Brother   . Heart disease Brother   . Diabetes Maternal Uncle     ROS:   Please see the history of present illness.  All other systems are reviewed and otherwise negative.    PHYSICAL EXAM:   VS:  BP (!) 160/70   Pulse 90   Ht 5' (1.524 m)   Wt 201 lb 6.4 oz (91.4 kg)   LMP  (LMP Unknown)   BMI 39.33 kg/m   BMI: Body mass index is 39.33  kg/m. GEN: Well nourished, well developed obese WF, in no acute distress  HEENT: normocephalic, atraumatic Neck: no JVD, carotid bruits, or masses Cardiac: RRR; no murmurs, rubs, or gallops, no edema - compression hose in place Respiratory:  clear to auscultation bilaterally, normal work of breathing GI: soft, nontender, nondistended, + BS MS: no deformity or atrophy  Skin: warm and dry, no rash Neuro:  Alert and Oriented x 3, Strength and sensation are intact, follows commands Psych: euthymic mood, full affect  Wt Readings from Last 3 Encounters:  08/26/17 201 lb 6.4 oz (91.4 kg)  08/18/17 200 lb 6.4 oz (90.9 kg)  08/08/17 200 lb 6.4 oz (90.9 kg)      Studies/Labs Reviewed:   EKG:  EKG was ordered today and personally reviewed by me and demonstrates NSR 90bpm, nonspecific diffuse ST-T changes similar to prior  Recent Labs: 05/17/2017: ALT 18 07/15/2017: B Natriuretic Peptide 69.9; BUN 13; Creatinine, Ser 0.95; Hemoglobin 13.3; Platelets 258; Potassium 3.7; Sodium 140   Lipid Panel    Component Value Date/Time   CHOL 171 07/16/2017 0402   TRIG 249 (H) 07/16/2017 0402   HDL 41 07/16/2017 0402   CHOLHDL 4.2 07/16/2017 0402   VLDL 50 (H) 07/16/2017 0402   LDLCALC 80 07/16/2017 0402   LDLDIRECT 103 (H) 03/11/2010 2127    Additional studies/ records that were reviewed today include: Summarized above    ASSESSMENT & PLAN:   This patient's case was discussed in depth with Dr. Harrington Challenger. The plan below was formulated per our discussion.  1. Chronic diastolic CHF/hypertensive heart disease - appears euvolemic on exam. Continue present diuretic regimen. She's not had a BMET since discharge from the hospital. Recheck today. Reviewed 2g sodium restriction, 2L fluid restriction, daily weights with patient. Also discussed importance of long-term weight loss.  2. Essential HTN - blood pressure remains elevated. Patient reports  most of her blood pressures are around 150/60s at home.  Etiology of widened pulse pressure not totally clear. Aortic insufficiency was only mild by recent echo. She does have h/o orthostasis in the past so caution has been exercised in aggressive treatment of HTN. She would benefit from systolic blood pressure closer to 130 or less, but her multiple intolerances also make management challenging. Per discussion with Dr. Harrington Challenger, will hold off on medicine changes while investigating possible secondary causes - given her obesity, prior suspected OSA and HTN, she is at high risk for possible contribution of blood pressure from sleep apnea. Will obtain sleep study. Will also check screening TSH. If blood pressure remains poorly controlled, consideration could be given to trial of bisoprolol (given SOB/coughing with metoprolol/carvedilol) or hydralazine. 3. Mild AI/MR/Moderate PR - no further intervention necessary at present time; follow. 4. Abnormal EKG/elevated troponin - she reports fleeting chest pain 1-2x per week, non-exertional, atypical for angina. She denies any exertional chest pain. However, her overall functional status is decreased due to knee pain and having to walk with a cane. Continue aspirin and statin. Plan Lexiscan nuclear stress test.  Disposition: F/u with Dr. Harrington Challenger In 4-6 weeks.  Medication Adjustments/Labs and Tests Ordered: Current medicines are reviewed at length with the patient today.  Concerns regarding medicines are outlined above. Medication changes, Labs and Tests ordered today are summarized above and listed in the Patient Instructions accessible in Encounters.   Signed, Charlie Pitter, PA-C  08/26/2017 1:59 PM    [facility] [address], [place], [place]  [address] Phone: [phone]; Fax: [phone]

## 2017-08-27 LAB — BASIC METABOLIC PANEL
BUN/Creatinine Ratio: 24 (ref 12–28)
BUN: 23 mg/dL (ref 8–27)
CALCIUM: 10.6 mg/dL — AB (ref 8.7–10.3)
CO2: 25 mmol/L (ref 20–29)
Chloride: 99 mmol/L (ref 96–106)
Creatinine, Ser: 0.95 mg/dL (ref 0.57–1.00)
GFR, EST AFRICAN AMERICAN: 67 mL/min/{1.73_m2} (ref >59)
GFR, EST NON AFRICAN AMERICAN: 58 mL/min/{1.73_m2} — AB (ref >59)
Glucose: 98 mg/dL (ref 65–99)
POTASSIUM: 4.6 mmol/L (ref 3.5–5.2)
Sodium: 144 mmol/L (ref 134–144)

## 2017-08-27 LAB — TSH: TSH: 7.92 u[IU]/mL — ABNORMAL HIGH (ref 0.450–4.500)

## 2017-08-29 ENCOUNTER — Encounter: Payer: Self-pay | Admitting: Internal Medicine

## 2017-08-30 ENCOUNTER — Telehealth: Payer: Self-pay | Admitting: *Deleted

## 2017-08-30 DIAGNOSIS — K3184 Gastroparesis: Secondary | ICD-10-CM | POA: Diagnosis not present

## 2017-08-30 DIAGNOSIS — M150 Primary generalized (osteo)arthritis: Secondary | ICD-10-CM | POA: Diagnosis not present

## 2017-08-30 DIAGNOSIS — G4700 Insomnia, unspecified: Secondary | ICD-10-CM

## 2017-08-30 DIAGNOSIS — I110 Hypertensive heart disease with heart failure: Secondary | ICD-10-CM | POA: Diagnosis not present

## 2017-08-30 DIAGNOSIS — I5032 Chronic diastolic (congestive) heart failure: Secondary | ICD-10-CM | POA: Diagnosis not present

## 2017-08-30 DIAGNOSIS — E1143 Type 2 diabetes mellitus with diabetic autonomic (poly)neuropathy: Secondary | ICD-10-CM | POA: Diagnosis not present

## 2017-08-30 NOTE — Telephone Encounter (Signed)
-----   Message from Jeanann Lewandowsky, [place] sent at 08/26/2017  4:35 PM EDT ----- PT NEEDS SLEEP STUDY

## 2017-08-30 NOTE — Telephone Encounter (Addendum)
Informed patient of upcoming sleep study and patient understanding was verbalized. Patient understands her sleep study is scheduled for Thursday September 22 2017. Patient understands her sleep study will be done at [facility] sleep lab. Patient understands she will receive a sleep packet in a week or so. Patient understands to call if she does not receive the sleep packet in a timely manner.  Patient states she takes Ativan nightly before bed and wonders if this will be ok to take at the sleep study.  Patient agrees with treatment and thanked me for call.

## 2017-08-31 ENCOUNTER — Telehealth: Payer: Self-pay | Admitting: Internal Medicine

## 2017-08-31 NOTE — Telephone Encounter (Signed)
Patient states she was instructed to hold metformin and insulin prior.  I advised her to hold her fluid pills that am, eat breakfast 2 hours prior to coming and after that drink only water, and that someone will call her back with further instructions this week.

## 2017-08-31 NOTE — Telephone Encounter (Signed)
°  New Message   pt verbalized that she is calling for rn   On 09-06-2017 she wants to know what   medications can she take and what she cant

## 2017-09-01 ENCOUNTER — Telehealth (HOSPITAL_COMMUNITY): Payer: Self-pay | Admitting: *Deleted

## 2017-09-01 NOTE — Telephone Encounter (Signed)
Returned pts call and made sure she understood what medications she can and cannot take the morning of her stress test. Pt had no more questions.

## 2017-09-01 NOTE — Telephone Encounter (Signed)
Left message on voicemail per DPR in reference to upcoming appointment scheduled on 09/06/17 with detailed instructions given per Myocardial Perfusion Study Information Sheet for the test. LM to arrive 15 minutes early, and that it is imperative to arrive on time for appointment to keep from having the test rescheduled. If you need to cancel or reschedule your appointment, please call the office within 24 hours of your appointment. Failure to do so may result in a cancellation of your appointment, and a $50 no show fee. Phone number given for call back for any questions. Ellianna Ruest Jacqueline    

## 2017-09-01 NOTE — Telephone Encounter (Signed)
Returned pts call and left her a message to call back. 

## 2017-09-02 NOTE — Telephone Encounter (Signed)
Ativan is fine to take at sleep study

## 2017-09-04 ENCOUNTER — Other Ambulatory Visit: Payer: Self-pay | Admitting: Internal Medicine

## 2017-09-05 ENCOUNTER — Ambulatory Visit (INDEPENDENT_AMBULATORY_CARE_PROVIDER_SITE_OTHER): Payer: Medicare Other | Admitting: Internal Medicine

## 2017-09-05 ENCOUNTER — Encounter: Payer: Self-pay | Admitting: Internal Medicine

## 2017-09-05 DIAGNOSIS — I110 Hypertensive heart disease with heart failure: Secondary | ICD-10-CM | POA: Diagnosis not present

## 2017-09-05 DIAGNOSIS — I5032 Chronic diastolic (congestive) heart failure: Secondary | ICD-10-CM | POA: Diagnosis not present

## 2017-09-05 DIAGNOSIS — R946 Abnormal results of thyroid function studies: Secondary | ICD-10-CM | POA: Diagnosis not present

## 2017-09-05 DIAGNOSIS — R7989 Other specified abnormal findings of blood chemistry: Secondary | ICD-10-CM

## 2017-09-05 NOTE — Assessment & Plan Note (Addendum)
Blood pressure at personal goal given history of orthostasis in past with tight control. Cardiology working up etiology of widened pulse pressure. Continue current medication regimen. Follow up in 1 month with PCP or Koval pending completion of cardiology work up.

## 2017-09-05 NOTE — Progress Notes (Signed)
   Subjective:    Rebecca Walter - 76 y.o. female MRN [id number]  Date of birth: 22-May-1941  HPI  Rebecca Walter is here for follow up of irregular labs from cardiologist.  Elevated TSH: TSH noted to be elevated. Patient denies history of thyroid disorder. She denies hot/cold intolerance, hair/skin/nail changes, fatigue, and unintended changes in weight.   Elevated Ca: Patient denies taking Ca supplements. Has no known family history of hyperparathyroidism or MEN. No new changes in bowels. No nausea or vomiting. No fatigue or neurological changes.   -  reports that she has never smoked. She has never used smokeless tobacco. - Review of Systems: Per HPI. - Past Medical History: Patient Active Problem List   Diagnosis Date Noted  . Hypertensive heart disease with CHF (congestive heart failure) ([place])   . Hypertensive emergency 07/15/2017  . Elevated troponin 07/15/2017  . Chronic diastolic CHF (congestive heart failure) ([place]) 07/15/2017  . Mild aortic stenosis 06/19/2017  . Mild mitral regurgitation 06/19/2017  . Moderate pulmonary valve insufficiency 06/19/2017  . Lumbar back pain with radiculopathy affecting right lower extremity 10/29/2016  . Difficulty urinating 02/06/2015  . Right shoulder pain 04/25/2014  . Allergic rhinitis 03/22/2011  . INSOMNIA 09/16/2009  . GASTROPARESIS 08/12/2009  . Anxiety 10/18/2008  . SYSTOLIC MURMUR 12/87/8676  . DYSKINESIA, ESOPHAGUS 09/13/2007  . CARCINOMA, BASAL CELL 05/26/2007  . Diabetes mellitus, type II ([place]) 02/16/2007  . HLD (hyperlipidemia) 02/16/2007  . OBESITY, NOS 02/16/2007  . GASTROESOPHAGEAL REFLUX, NO ESOPHAGITIS 02/16/2007  . OSTEOARTHRITIS, MULTI SITES 02/16/2007  . OSTEOPENIA 02/16/2007  . Gout 02/16/2007   - Medications: reviewed and updated   Objective:   Physical Exam BP (!) 150/62   Pulse 87   Temp 98.9 F (37.2 C) (Oral)   Ht 5' (1.524 m)   Wt 197 lb (89.4 kg)   LMP  (LMP Unknown)   SpO2 96%   BMI 38.47 kg/m    Gen: NAD, alert, cooperative with exam, well-appearing HEENT: NCAT, PERRL, clear conjunctiva, oropharynx clear, supple neck without thyromegaly or goiter present  CV: RRR, good H2/C9, 2/6 systolic murmur, no LE edema  Resp: CTABL, no wheezes, non-labored Abd: SNTND, BS present, no guarding or organomegaly Skin: no rashes, normal turgor  Neuro: CN II-XII grossly intact. Strength and sensation preserved in extremities.  Psych: good insight, alert and oriented    Assessment & Plan:   Hypertensive heart disease with CHF (congestive heart failure) ([place]) Blood pressure at personal goal given history of orthostasis in past with tight control. Cardiology working up etiology of widened pulse pressure. Continue current medication regimen. Follow up in 1 month with PCP or Koval pending completion of cardiology work up.   Serum calcium elevated No known medication etiology. Will repeat Ca level as well as check protein levels.  - Comprehensive metabolic panel  Elevated TSH TSH elevated to 7. Previously normal in 2011. Will repeat thyroid studies today.  - TSH - T3, Free - T4, Free  Phill Myron, D.O. 09/05/2017, 3:28 PM PGY-3, [place]

## 2017-09-05 NOTE — Patient Instructions (Signed)
I will call you with your lab results. Good luck with your heart testing tomorrow. Please follow up in 1 month with me or Dr. Valentina Lucks after all of the testing is done.   Take Care,   Dr. Juleen China

## 2017-09-06 ENCOUNTER — Telehealth: Payer: Self-pay | Admitting: Internal Medicine

## 2017-09-06 ENCOUNTER — Ambulatory Visit (HOSPITAL_COMMUNITY): Payer: Medicare Other | Attending: Cardiology

## 2017-09-06 ENCOUNTER — Other Ambulatory Visit: Payer: Self-pay | Admitting: Internal Medicine

## 2017-09-06 DIAGNOSIS — Z794 Long term (current) use of insulin: Secondary | ICD-10-CM | POA: Diagnosis not present

## 2017-09-06 DIAGNOSIS — R9431 Abnormal electrocardiogram [ECG] [EKG]: Secondary | ICD-10-CM | POA: Diagnosis not present

## 2017-09-06 DIAGNOSIS — E119 Type 2 diabetes mellitus without complications: Secondary | ICD-10-CM | POA: Insufficient documentation

## 2017-09-06 DIAGNOSIS — I10 Essential (primary) hypertension: Secondary | ICD-10-CM | POA: Diagnosis not present

## 2017-09-06 DIAGNOSIS — R7989 Other specified abnormal findings of blood chemistry: Secondary | ICD-10-CM

## 2017-09-06 LAB — COMPREHENSIVE METABOLIC PANEL
ALBUMIN: 4.6 g/dL (ref 3.5–4.8)
ALK PHOS: 53 IU/L (ref 39–117)
ALT: 18 IU/L (ref 0–32)
AST: 18 IU/L (ref 0–40)
Albumin/Globulin Ratio: 1.5 (ref 1.2–2.2)
BUN / CREAT RATIO: 27 (ref 12–28)
BUN: 30 mg/dL — AB (ref 8–27)
Bilirubin Total: <0.2 mg/dL (ref 0.0–1.2)
CALCIUM: 10.1 mg/dL (ref 8.7–10.3)
CO2: 24 mmol/L (ref 20–29)
CREATININE: 1.13 mg/dL — AB (ref 0.57–1.00)
Chloride: 99 mmol/L (ref 96–106)
GFR calc Af Amer: 54 mL/min/{1.73_m2} — ABNORMAL LOW (ref >59)
GFR, EST NON AFRICAN AMERICAN: 47 mL/min/{1.73_m2} — AB (ref >59)
GLOBULIN, TOTAL: 3 g/dL (ref 1.5–4.5)
GLUCOSE: 137 mg/dL — AB (ref 65–99)
Potassium: 4.6 mmol/L (ref 3.5–5.2)
SODIUM: 142 mmol/L (ref 134–144)
Total Protein: 7.6 g/dL (ref 6.0–8.5)

## 2017-09-06 LAB — MYOCARDIAL PERFUSION IMAGING
CHL CUP NUCLEAR SRS: 5
CHL CUP NUCLEAR SSS: 9
LV dias vol: 99 mL (ref 46–106)
LV sys vol: 33 mL
RATE: 0.31
SDS: 4
TID: 0.94

## 2017-09-06 LAB — TSH: TSH: 9.61 u[IU]/mL — AB (ref 0.450–4.500)

## 2017-09-06 LAB — T4, FREE: Free T4: 0.96 ng/dL (ref 0.82–1.77)

## 2017-09-06 LAB — T3, FREE: T3, Free: 2.9 pg/mL (ref 2.0–4.4)

## 2017-09-06 MED ORDER — TECHNETIUM TC 99M TETROFOSMIN IV KIT
10.8000 | PACK | Freq: Once | INTRAVENOUS | Status: AC | PRN
  Administered 2017-09-06: 10.8 via INTRAVENOUS
  Filled 2017-09-06: qty 11

## 2017-09-06 MED ORDER — TECHNETIUM TC 99M TETROFOSMIN IV KIT
33.0000 | PACK | Freq: Once | INTRAVENOUS | Status: AC | PRN
  Administered 2017-09-06: 33 via INTRAVENOUS
  Filled 2017-09-06: qty 33

## 2017-09-06 MED ORDER — REGADENOSON 0.4 MG/5ML IV SOLN
0.4000 mg | Freq: Once | INTRAVENOUS | Status: AC
  Administered 2017-09-06: 0.4 mg via INTRAVENOUS

## 2017-09-06 NOTE — Telephone Encounter (Signed)
Patient should have two refills remaining on Rx I gave her on 7/3. I checked [place] Controlled Substance database and she has only filled one of the three prescriptions. Patient should call pharmacy regarding the refills on file.   Phill Myron, D.O. 09/06/2017, 1:35 PM PGY-3, [place]

## 2017-09-06 NOTE — Telephone Encounter (Signed)
Patient return provider's call. Lab result discussed with patient. Patient understood. Appointment scheduled for Lab visit 10/18/17 at 1:45 PM.  Patient also advised that she should have 2 refills left on Rx Ambien and to call her pharmacy.  Derl Barrow, RN

## 2017-09-06 NOTE — Telephone Encounter (Signed)
Attempted to call patient with lab results. No answer so left VM. Please inform patient that Calcium and protein within normal limits on lab. TSH still mildly elevated but actual thyroid levels within normal limits. Would recommend repeating labs in 6 weeks (these were ordered as future labs under an orders only encounter). She will need a lab visit for this.   If TSH still elevated, would consider starting low dose Synthroid given history of uncontrolled HTN.   Phill Myron, D.O. 09/06/2017, 9:21 AM PGY-3, [place]

## 2017-09-06 NOTE — Telephone Encounter (Signed)
Pt called because she forgot to tell Dr. Juleen China that she needed a refill on her Ativan. She [facility] and she would like the doctor to write this out and leave up front so that when she gets done with the stress test she can come by and pick up since she will be right across the street. jw

## 2017-09-06 NOTE — Progress Notes (Signed)
Future order for thyroid labs placed. These should be repeated in about 6 weeks.   Phill Myron, D.O. 09/06/2017, 9:22 AM PGY-3, [place]

## 2017-09-07 ENCOUNTER — Telehealth: Payer: Self-pay

## 2017-09-07 NOTE — Telephone Encounter (Signed)
LVM on mobile phone for pt to call. Home number not in service. If pt calls, please give her the information below. Ottis Stain, CMA

## 2017-09-07 NOTE — Telephone Encounter (Signed)
Patient called and was given information regarding labs that she was called on.  She receives and understands.Rebecca Walter

## 2017-09-07 NOTE — Telephone Encounter (Signed)
Pt spoke to [facility] 9/18. Pt informed of the information below. Ottis Stain, CMA

## 2017-09-08 ENCOUNTER — Telehealth: Payer: Self-pay | Admitting: Pharmacist

## 2017-09-08 DIAGNOSIS — I5032 Chronic diastolic (congestive) heart failure: Secondary | ICD-10-CM | POA: Diagnosis not present

## 2017-09-08 DIAGNOSIS — K3184 Gastroparesis: Secondary | ICD-10-CM | POA: Diagnosis not present

## 2017-09-08 DIAGNOSIS — M150 Primary generalized (osteo)arthritis: Secondary | ICD-10-CM | POA: Diagnosis not present

## 2017-09-08 DIAGNOSIS — I110 Hypertensive heart disease with heart failure: Secondary | ICD-10-CM | POA: Diagnosis not present

## 2017-09-08 DIAGNOSIS — E1143 Type 2 diabetes mellitus with diabetic autonomic (poly)neuropathy: Secondary | ICD-10-CM | POA: Diagnosis not present

## 2017-09-08 NOTE — Telephone Encounter (Signed)
Pt would like to talk to dr Valentina Lucks about her medication, candesartan.

## 2017-09-08 NOTE — Telephone Encounter (Signed)
Discussed leg weakness which has resolved with massage, muscle relaxant and some time.  She states she would stay candesartan.  Also shared her stress test was normal.  She was happy to report she has no Dr. Kendrick Fries next week.   Discussed no change in therapy and follow up as needed.

## 2017-09-15 DIAGNOSIS — E1143 Type 2 diabetes mellitus with diabetic autonomic (poly)neuropathy: Secondary | ICD-10-CM | POA: Diagnosis not present

## 2017-09-15 DIAGNOSIS — K3184 Gastroparesis: Secondary | ICD-10-CM | POA: Diagnosis not present

## 2017-09-15 DIAGNOSIS — I5032 Chronic diastolic (congestive) heart failure: Secondary | ICD-10-CM | POA: Diagnosis not present

## 2017-09-15 DIAGNOSIS — M150 Primary generalized (osteo)arthritis: Secondary | ICD-10-CM | POA: Diagnosis not present

## 2017-09-15 DIAGNOSIS — I110 Hypertensive heart disease with heart failure: Secondary | ICD-10-CM | POA: Diagnosis not present

## 2017-09-19 ENCOUNTER — Telehealth: Payer: Self-pay | Admitting: Pharmacist

## 2017-09-19 ENCOUNTER — Telehealth: Payer: Self-pay | Admitting: Internal Medicine

## 2017-09-19 NOTE — Telephone Encounter (Signed)
Called with report of feeling of clouded feeling in her head this morning which is now resolved.  She believes she may be getting dehydrated.  She is concerned that she drank too much water yesterday. She reported that her weight was lower and increased.  She reported 191# this weekend then drank three 16 ounce bottles of water.  She woke up this AM feeling unusually clouded and wonders if her electrolytes are abnormal.  She reports her weight today was back to her current - hydrated weight of #196.  She reports feeling better this afternoon.   She asked if she should hold any medications.  I instructed her to take her medications (both diuretics) today.  Additionally, I instructed her to call in the AM and request a work-in appointment or a visit with Dr. Juleen China if she continued to feel abnormal.   If she feels better tomorrow, she was asked to share her symptoms with her cardiologist on Thursday.    I anticipate obtaining a  BMET if she continues to have symptoms.  She was thankful for the phone call ollow up and she plans to take her medications as prescribed.

## 2017-09-19 NOTE — Telephone Encounter (Signed)
Pt is calling because she is feeling a little strange. She said that her head is a little fuzzy? She thinks that she might be taking to much on her fluid medication. She has 2 different kinds and she is following the directions correctly. She would really like to speak to Dr. Inis Sizer

## 2017-09-21 ENCOUNTER — Telehealth: Payer: Self-pay | Admitting: Internal Medicine

## 2017-09-21 DIAGNOSIS — I110 Hypertensive heart disease with heart failure: Secondary | ICD-10-CM | POA: Diagnosis not present

## 2017-09-21 DIAGNOSIS — I5032 Chronic diastolic (congestive) heart failure: Secondary | ICD-10-CM | POA: Diagnosis not present

## 2017-09-21 DIAGNOSIS — K3184 Gastroparesis: Secondary | ICD-10-CM | POA: Diagnosis not present

## 2017-09-21 DIAGNOSIS — M150 Primary generalized (osteo)arthritis: Secondary | ICD-10-CM | POA: Diagnosis not present

## 2017-09-21 DIAGNOSIS — E1143 Type 2 diabetes mellitus with diabetic autonomic (poly)neuropathy: Secondary | ICD-10-CM | POA: Diagnosis not present

## 2017-09-21 NOTE — Progress Notes (Addendum)
Cardiology Office Note   Date:  09/22/2017   ID:  Rebecca Walter, DOB [birth year], MRN [id number]  PCP:  Nicolette Bang, DO  Cardiologist:   Dorris Carnes, MD   F/U of HTN  Chest discomfort      History of Present Illness: Rebecca Walter is a 76 y.o. female with a history of HTN, HL, DM, GERD, chronic distlic CHF, Seen by D Dunn on consult on 08/26/17  Pt had stress test remotely    Admitted in July with hypertensive emergency  SBP 260s  Echo with mild LVH  LVEF 60 to 65%  Stopped coreg due to SOB  CStopped clonidine   When she saw Dayna  BP was elevated  Complained of fleeting chest discomfott recomm Lexiscan myovue and f/u of BP   Myovue with small area of  mid anteroseptal and apical defect consistent with ischemia  Low risk  LVEF 67%  Since seen she deneis CP   Breathing is OK  BP at home 120s to 160     Current Meds  Medication Sig  . allopurinol (ZYLOPRIM) 100 MG tablet TAKE 2 TABLETS BY MOUTH EVERY DAY  . alum & mag hydroxide-simeth (MAALOX/MYLANTA) 200-200-20 MG/5ML suspension Take 30 mLs by mouth every 6 (six) hours as needed for indigestion or heartburn.  Marland Kitchen amLODipine (NORVASC) 10 MG tablet Take 1 tablet (10 mg total) by mouth daily.  Marland Kitchen aspirin 81 MG chewable tablet Chew 1 tablet (81 mg total) by mouth daily.  . B-D ULTRAFINE III SHORT PEN 31G X 8 MM MISC USE AS DIRECTED  . baclofen (LIORESAL) 10 MG tablet TAKE 1 TABLET BY MOUTH 3 TIMES A DAY AS NEEDED FOR MUSCLE SPASMS  . candesartan (ATACAND) 32 MG tablet Take 1 tablet (32 mg total) by mouth at bedtime.  Marland Kitchen glucose blood (ACCU-CHEK AVIVA PLUS) test strip CHECK BLOOD SUGAR FIRST  THING IN THE MORNING BEFORE EATING, AFTER LUNCH, AND  AFTER DINNER TOTAL 3 TIMES  DAILY. ICD 10-code: E11.9  . insulin aspart (NOVOLOG FLEXPEN) 100 UNIT/ML FlexPen - Before breakfast: Take 30 units if your blood sugar is 80-180, if it is > 180 take 35 units - Before lunch: Take 15 units if your blood sugar is 80-180, if it is > 180 take 20  units - Before dinner: Take 20 units if your blood sugar is 80-180, if it is > 180 take 25 units  . LANTUS SOLOSTAR 100 UNIT/ML Solostar Pen INJECT 70 UNITS INTO THE SKIN EVERY MORNING.  Marland Kitchen loratadine (CLARITIN) 10 MG tablet Take 10 mg by mouth daily.  Marland Kitchen LORazepam (ATIVAN) 1 MG tablet Take 1 tablet (1 mg total) by mouth 2 (two) times daily as needed for anxiety.  . lovastatin (MEVACOR) 40 MG tablet Take 80 mg by mouth 2 (two) times daily.  . metFORMIN (GLUCOPHAGE-XR) 500 MG 24 hr tablet TAKE 1 TABLET TWICE A DAY BEFORE MEALS  . metoCLOPramide (REGLAN) 5 MG tablet TAKE 1 TABLET 30 MIN BEFORE MEALS AND 1 TABLET AT BEDTIME. FOUR TIMES/DAY  . Multiple Vitamin (MULTIVITAMIN) tablet Take 1 tablet by mouth daily.    . pantoprazole (PROTONIX) 40 MG tablet Take 40 mg by mouth 2 (two) times daily.   . Probiotic Product (PROBIOTIC PEARLS) CAPS Take 1 capsule by mouth once.   . ranitidine (ZANTAC) 150 MG tablet Take 150 mg by mouth 2 (two) times daily as needed.   . senna (SENOKOT) 8.6 MG TABS tablet Take 1 tablet by mouth at bedtime  as needed for mild constipation.  Marland Kitchen spironolactone (ALDACTONE) 25 MG tablet Take 1 tablet (25 mg total) by mouth daily.  Marland Kitchen torsemide (DEMADEX) 20 MG tablet Take 1 tablet (20 mg total) by mouth daily.  . vitamin E (NATURAL MIXED TOCOPHEROLS) 400 UNIT capsule Take 400 Units by mouth daily.      Allergies:   Hctz [hydrochlorothiazide]; Lotensin [benazepril]; Victoza [liraglutide]; Acyclovir and related; Beta adrenergic blockers; and Losartan   Past Medical History:  Diagnosis Date  . Anxiety   . Arthritis   . Chronic diastolic CHF (congestive heart failure) ([place])   . Diabetes mellitus   . GERD (gastroesophageal reflux disease)   . Gout   . Hyperlipidemia   . Hypertension   . Hypertensive heart disease with CHF (congestive heart failure) ([place])   . Mild aortic stenosis 06/2017  . Mild mitral regurgitation 06/2017  . Moderate pulmonary valve insufficiency 06/2017  .  Osteopenia     Past Surgical History:  Procedure Laterality Date  . KIDNEY SURGERY    . KNEE SURGERY    . MENISECTOMY    . WRIST SURGERY       Social History:  The patient  reports that she has never smoked. She has never used smokeless tobacco. She reports that she does not drink alcohol or use drugs.   Family History:  The patient's family history includes Cancer in her brother and sister; Diabetes in her maternal uncle; Heart disease in her brother and father; Hypertension in her father; Stroke in her father and mother.    ROS:  Please see the history of present illness. All other systems are reviewed and  Negative to the above problem except as noted.    PHYSICAL EXAM: VS:  BP 130/70   Pulse 87   Ht 5' (1.524 m)   Wt 200 lb 12.8 oz (91.1 kg)   LMP  (LMP Unknown)   SpO2 94%   BMI 39.22 kg/m   GEN: Well nourished, well developed, in no acute distress  HEENT: normal  Neck: no JVD, carotid bruits, or masses Cardiac: RRR; no murmurs, rubs, or gallops,Tr edema (wearing support socks)   Respiratory:  clear to auscultation bilaterally, normal work of breathing  Minimal rhonchi   GI: soft, nontender, nondistended, + BS  No hepatomegaly  MS: no deformity Moving all extremities   Skin: warm and dry, no rash Neuro:  Strength and sensation are intact Psych: euthymic mood, full affect   EKG:  EKG is not ordered today.   Lipid Panel    Component Value Date/Time   CHOL 171 07/16/2017 0402   TRIG 249 (H) 07/16/2017 0402   HDL 41 07/16/2017 0402   CHOLHDL 4.2 07/16/2017 0402   VLDL 50 (H) 07/16/2017 0402   LDLCALC 80 07/16/2017 0402   LDLDIRECT 103 (H) 03/11/2010 2127      Wt Readings from Last 3 Encounters:  09/22/17 200 lb 12.8 oz (91.1 kg)  09/06/17 201 lb (91.2 kg)  09/05/17 197 lb (89.4 kg)      ASSESSMENT AND PLAN:  1  Chest tightnes   She deneis  Myovue scan as noted above  ? Shifting breast vis mild ischemia  I would keep on medical Rx since she is rel  asymptomatic  2  HTN  BP is fair  I would continue  Pt thinks candasartan makes her legs weak   She has mult intolerances  I will check with pharma y    Check BMET today  Continue  meds for now  3  HL  Keep on lovastatin    4  Diastolic CHF  Volume status is not bad  Keep on current meds     Current medicines are reviewed at length with the patient today.  The patient does not have concerns regarding medicines.  Signed, Dorris Carnes, MD  09/22/2017 12:17 PM    [facility] [address], [place], [place]  [address] Phone: [phone]; Fax: [phone]

## 2017-09-21 NOTE — Telephone Encounter (Signed)
New Message  Pt call requesting to speak with RN. Pt would like to know if she would need to bring her bp monitor to her appt on 10/4. Please call back to discuss

## 2017-09-21 NOTE — Telephone Encounter (Signed)
Pt is calling and would like to speak to Dr. Valentina Lucks again. She has another question for him. Blima Rich

## 2017-09-21 NOTE — Telephone Encounter (Signed)
Patient called to say that she takes her home BP monitor with her to her PCP visits and wanted to know if she needed to bring it with her to the visit on 09/22/17. Patient advised that if she needed her monitor checked for accuracy, she could bring it with her to the visit. Patient said that she knew that her BP monitor was already accurate. Patient advised that it was not required to bring her BP monitor to her upcoming visit but that she did need to bring her medications with her. Patient verbalized understanding of plan.

## 2017-09-22 ENCOUNTER — Encounter (HOSPITAL_BASED_OUTPATIENT_CLINIC_OR_DEPARTMENT_OTHER): Payer: Medicare Other

## 2017-09-22 ENCOUNTER — Ambulatory Visit (INDEPENDENT_AMBULATORY_CARE_PROVIDER_SITE_OTHER): Payer: Medicare Other | Admitting: Internal Medicine

## 2017-09-22 ENCOUNTER — Encounter: Payer: Self-pay | Admitting: Internal Medicine

## 2017-09-22 VITALS — BP 130/70 | HR 87 | Ht 60.0 in | Wt 200.8 lb

## 2017-09-22 DIAGNOSIS — R0789 Other chest pain: Secondary | ICD-10-CM

## 2017-09-22 DIAGNOSIS — I110 Hypertensive heart disease with heart failure: Secondary | ICD-10-CM

## 2017-09-22 DIAGNOSIS — E782 Mixed hyperlipidemia: Secondary | ICD-10-CM | POA: Diagnosis not present

## 2017-09-22 DIAGNOSIS — I10 Essential (primary) hypertension: Secondary | ICD-10-CM

## 2017-09-22 DIAGNOSIS — I5032 Chronic diastolic (congestive) heart failure: Secondary | ICD-10-CM

## 2017-09-22 NOTE — Patient Instructions (Signed)
Your physician recommends that you continue on your current medications as directed. Please refer to the Current Medication list given to you today. Your physician recommends that you return for lab work in: February, 2019 with Dr. Harrington Challenger.

## 2017-09-23 ENCOUNTER — Ambulatory Visit: Payer: Medicare Other | Admitting: Cardiology

## 2017-09-23 ENCOUNTER — Telehealth: Payer: Self-pay | Admitting: Family Medicine

## 2017-09-23 ENCOUNTER — Other Ambulatory Visit: Payer: Self-pay | Admitting: Pharmacist

## 2017-09-23 ENCOUNTER — Telehealth: Payer: Self-pay | Admitting: Internal Medicine

## 2017-09-23 DIAGNOSIS — I10 Essential (primary) hypertension: Secondary | ICD-10-CM

## 2017-09-23 DIAGNOSIS — E875 Hyperkalemia: Secondary | ICD-10-CM

## 2017-09-23 LAB — BASIC METABOLIC PANEL
BUN/Creatinine Ratio: 26 (ref 12–28)
BUN: 26 mg/dL (ref 8–27)
CALCIUM: 10.5 mg/dL — AB (ref 8.7–10.3)
CHLORIDE: 98 mmol/L (ref 96–106)
CO2: 23 mmol/L (ref 20–29)
Creatinine, Ser: 1.01 mg/dL — ABNORMAL HIGH (ref 0.57–1.00)
GFR calc Af Amer: 62 mL/min/{1.73_m2} (ref >59)
GFR calc non Af Amer: 53 mL/min/{1.73_m2} — ABNORMAL LOW (ref >59)
GLUCOSE: 89 mg/dL (ref 65–99)
Potassium: 5.5 mmol/L — ABNORMAL HIGH (ref 3.5–5.2)
Sodium: 140 mmol/L (ref 134–144)

## 2017-09-23 MED ORDER — SPIRONOLACTONE 25 MG PO TABS: 12.5000 mg | ORAL_TABLET | Freq: Every day | ORAL | Status: DC

## 2017-09-23 NOTE — Telephone Encounter (Signed)
Notes recorded by Fay Records, MD on 09/23/2017 at 8:54 AM EDT Potassium is a little high 5.5  I would cut aldactone to 12.5 mg Daily Needs BMET in 10 days   I spoke with patient. She understandings to decrease aldactone to 12.5 mg once a day.   Her medication list has been updated to reflect 12.5 mg by Dr. Andria Frames already today.  Pt had been on 25 mg prior.  She has appointment with family medicine on 10/11 and expects to have labs drawn there.   I will watch for these labs and will review and advise further re: potassium at that time.

## 2017-09-23 NOTE — Telephone Encounter (Signed)
New message    Pt is calling to find out about lab results.

## 2017-09-23 NOTE — Telephone Encounter (Signed)
Contact in follow up of request from 10/3.  Thanked her for her patience.  She inquired about her lab from the cardiology visit and result of elevated potassium of 5.5 was shared.  She did report taking her torsemide after the cardiology visit yesterday.  Given excellent blood pressure in office and elevated potassium we agreed to reduce her spironolactone to 12.5mg  once daily and continue all other medications the same at this time.  Follow up with me in 1 week for additional evaluation - asked to make appointment.     At visit in 1 week she may request a change in candesartan.   Consider dose reduction of candesartan at that time.   She verbalized understanding of single change from 1 whole to 1/2 of her spironolactone tablet at this time.  I shared with her that I would also share my thoughts and communicate our discussion with Dr. Harrington Challenger.   As I have share the results of her BMET.

## 2017-09-23 NOTE — Telephone Encounter (Signed)
**  After Hours/ Emergency Line Call*  Received a call regarding recent hyperkalemia. Patient states she ate a [place] sandwich with mustard and was concern she had consumed too much potassium. She has cut her dose of spironolactone in half as advised by Dr. Valentina Lucks.  She is also continuing her candesartan as requested by her cardiologist. Endorsing nausea and "feeling funny.".  Denying vomiting, chest pain, shortness of breath, palpitations, diaphoresis, feelings of syncope. Her nausea is resolved and she was able to tolerate fluids.  Recommended that patient continue medications as instructed, stay keep well hydrated, and follow-up on 09/29/17 as scheduled.  Red flags discussed.  Will forward to PCP.  Harriet Butte, [place], PGY-2

## 2017-09-25 ENCOUNTER — Telehealth: Payer: Self-pay | Admitting: Student

## 2017-09-25 NOTE — Telephone Encounter (Signed)
FM after hour call response: Called patient in response to her page on after hour pager. Patient picked up the phone and verified her name and age.  She has a concern about her potassium. Last potassium 5.5 on 09/22/2017 at cardiology office. She was advised to cut down on his spironolactone from 25 mg to 12.5 mg daily. Today, she reports bilateral leg weakness more than usual. She reports getting leg weakness if she goes without eating for longer than usual. She also reports feeling sick in his stomach. She denies palpitation, chest pain or shortness of breath. Leg weakness improved after eating. She likes to know if she can see Dr. Valentina Lucks earlier. She was initially scheduled for 09/29/2017. She says she was asked by Dr. Valentina Lucks if she would like to be seen on 09/26/2017 when she saw him last. She is asking if there is availability on 09/26/2017. I scheduled her for with Dr. Valentina Lucks for 09/26/2017 at 10:30 PM. Advised her to show up 15 minutes early.  We also discussed about precautions including palpitation, chest pain, trouble breathing or other symptoms concerning to her. She voiced understanding. She appreciated the call.

## 2017-09-26 ENCOUNTER — Encounter: Payer: Self-pay | Admitting: Pharmacist

## 2017-09-26 ENCOUNTER — Ambulatory Visit (INDEPENDENT_AMBULATORY_CARE_PROVIDER_SITE_OTHER): Payer: Medicare Other | Admitting: Pharmacist

## 2017-09-26 VITALS — BP 148/64 | Wt 198.0 lb

## 2017-09-26 DIAGNOSIS — I5032 Chronic diastolic (congestive) heart failure: Secondary | ICD-10-CM | POA: Diagnosis not present

## 2017-09-26 DIAGNOSIS — Z23 Encounter for immunization: Secondary | ICD-10-CM | POA: Diagnosis not present

## 2017-09-26 DIAGNOSIS — I110 Hypertensive heart disease with heart failure: Secondary | ICD-10-CM

## 2017-09-26 NOTE — Progress Notes (Signed)
Patient ID: Rebecca Walter, female   DOB: 21-Sep-1941, 76 y.o.   MRN: [id number] Reviewed: Agree with Dr. Graylin Shiver documentation and management.

## 2017-09-26 NOTE — Patient Instructions (Addendum)
Great to see you today!  Your blood pressure appears to be doing well on current regimen.   I will call you tomorrow with the results of the lab work obtained today.   Please continue all medications without change today.   Follow-up with Pharmacy Clinic in 1 month.

## 2017-09-26 NOTE — Addendum Note (Signed)
Addended by: Tacy Dura RUMPLE, APRIL D on: 09/26/2017 12:12 PM   Modules accepted: Orders, SmartSet

## 2017-09-26 NOTE — Progress Notes (Signed)
   S:    Patient arrives in good spirits, ambulating with the assistance of a cane.    Presents to the clinic for hypertension evaluation. Patient returns for lab assessment following recent elevation in Potassium to 5.5 and subsequent reduction in dose of spironolactone from 25mg  to 12.5mg   daly. .  Patient was last seen by Primary Care Provider on 09/05/2017.   Patient reports adherence with medications.  Current BP Medications include: candesartan 32mg , amlodipine 10, spironolactione 12.5mg , torsemide 20mg  Antihypertensives tried in the past include: HCTZ, Clonidine, losartan, metoprolol.   Dietary habits include:  Trying to avoid excess sodium and potassium.    O:   Last 3 Office BP readings: BP Readings from Last 3 Encounters:  09/26/17 (!) 148/64  09/22/17 130/70  09/05/17 (!) 150/62    BMET    Component Value Date/Time   NA 140 09/22/2017 1240   K 5.5 (H) 09/22/2017 1240   CL 98 09/22/2017 1240   CO2 23 09/22/2017 1240   GLUCOSE 89 09/22/2017 1240   GLUCOSE 240 (H) 07/15/2017 2000   BUN 26 09/22/2017 1240   CREATININE 1.01 (H) 09/22/2017 1240   CREATININE 0.93 09/16/2016 1410   CALCIUM 10.5 (H) 09/22/2017 1240   GFRNONAA 53 (L) 09/22/2017 1240   GFRNONAA 70 02/06/2015 1418   GFRAA 62 09/22/2017 1240   GFRAA 81 02/06/2015 1418    A/P: Hypertension longstanding currently improved and tolerating therapy without adverse effect on current medications. candesartan 32mg , amlodipine 10, spironolactione 12.5mg , torsemide 20mg .   NO changes in medication today.  BMET obtained - plan to call and follow-up with her tomorrow.   Prevnar 13 and Flu shot administered today.   Results reviewed and written information provided.   Total time in face-to-face counseling 20 minutes.   F/U Clinic Visit with pharmacy clinic in 1 month.  Patient seen with Ulanda Edison, PharmD PGY-1 Resident.

## 2017-09-26 NOTE — Assessment & Plan Note (Signed)
Hypertension longstanding currently improved and tolerating therapy without adverse effect on current medications. candesartan 32mg , amlodipine 10, spironolactione 12.5mg , torsemide 20mg .   NO changes in medication today.  BMET obtained - plan to call and follow-up with her tomorrow.

## 2017-09-27 ENCOUNTER — Other Ambulatory Visit: Payer: Self-pay | Admitting: Family Medicine

## 2017-09-27 DIAGNOSIS — I10 Essential (primary) hypertension: Secondary | ICD-10-CM

## 2017-09-27 LAB — BASIC METABOLIC PANEL
BUN/Creatinine Ratio: 28 (ref 12–28)
BUN: 29 mg/dL — ABNORMAL HIGH (ref 8–27)
CALCIUM: 10.3 mg/dL (ref 8.7–10.3)
CHLORIDE: 97 mmol/L (ref 96–106)
CO2: 24 mmol/L (ref 20–29)
Creatinine, Ser: 1.04 mg/dL — ABNORMAL HIGH (ref 0.57–1.00)
GFR calc Af Amer: 59 mL/min/{1.73_m2} — ABNORMAL LOW (ref >59)
GFR calc non Af Amer: 52 mL/min/{1.73_m2} — ABNORMAL LOW (ref >59)
Glucose: 178 mg/dL — ABNORMAL HIGH (ref 65–99)
POTASSIUM: 4.5 mmol/L (ref 3.5–5.2)
Sodium: 137 mmol/L (ref 134–144)

## 2017-09-28 ENCOUNTER — Telehealth: Payer: Self-pay | Admitting: Internal Medicine

## 2017-09-28 DIAGNOSIS — Z794 Long term (current) use of insulin: Secondary | ICD-10-CM | POA: Diagnosis not present

## 2017-09-28 DIAGNOSIS — K219 Gastro-esophageal reflux disease without esophagitis: Secondary | ICD-10-CM | POA: Diagnosis not present

## 2017-09-28 DIAGNOSIS — E1143 Type 2 diabetes mellitus with diabetic autonomic (poly)neuropathy: Secondary | ICD-10-CM | POA: Diagnosis not present

## 2017-09-28 DIAGNOSIS — Z9181 History of falling: Secondary | ICD-10-CM | POA: Diagnosis not present

## 2017-09-28 DIAGNOSIS — M150 Primary generalized (osteo)arthritis: Secondary | ICD-10-CM | POA: Diagnosis not present

## 2017-09-28 DIAGNOSIS — M109 Gout, unspecified: Secondary | ICD-10-CM | POA: Diagnosis not present

## 2017-09-28 DIAGNOSIS — Z85828 Personal history of other malignant neoplasm of skin: Secondary | ICD-10-CM | POA: Diagnosis not present

## 2017-09-28 DIAGNOSIS — I110 Hypertensive heart disease with heart failure: Secondary | ICD-10-CM | POA: Diagnosis not present

## 2017-09-28 DIAGNOSIS — I5032 Chronic diastolic (congestive) heart failure: Secondary | ICD-10-CM | POA: Diagnosis not present

## 2017-09-28 DIAGNOSIS — K3184 Gastroparesis: Secondary | ICD-10-CM | POA: Diagnosis not present

## 2017-09-28 NOTE — Telephone Encounter (Signed)
Called and communicated lab was normal - She stated her symptoms of feeling "strange" and unsteady continue.  She continues to think it is the candesartan.   We plan to continue without change:  Candesartan 32mg  daily and spironolactone 12.5mg  daily at this time.   Consider dose reduction of candesartan at next visit if she continues to have unsteady gait.   Follow-up PRN.

## 2017-09-28 NOTE — Telephone Encounter (Signed)
Pt would like to speak to Dr. Valentina Lucks about some of her potassium issues jw

## 2017-09-29 ENCOUNTER — Ambulatory Visit: Payer: Medicare Other | Admitting: Pharmacist

## 2017-10-02 ENCOUNTER — Other Ambulatory Visit: Payer: Self-pay | Admitting: Family Medicine

## 2017-10-02 DIAGNOSIS — I10 Essential (primary) hypertension: Secondary | ICD-10-CM

## 2017-10-06 ENCOUNTER — Ambulatory Visit: Payer: Medicare Other | Admitting: Pharmacist

## 2017-10-06 DIAGNOSIS — K3184 Gastroparesis: Secondary | ICD-10-CM | POA: Diagnosis not present

## 2017-10-06 DIAGNOSIS — I110 Hypertensive heart disease with heart failure: Secondary | ICD-10-CM | POA: Diagnosis not present

## 2017-10-06 DIAGNOSIS — M150 Primary generalized (osteo)arthritis: Secondary | ICD-10-CM | POA: Diagnosis not present

## 2017-10-06 DIAGNOSIS — M109 Gout, unspecified: Secondary | ICD-10-CM | POA: Diagnosis not present

## 2017-10-06 DIAGNOSIS — Z85828 Personal history of other malignant neoplasm of skin: Secondary | ICD-10-CM | POA: Diagnosis not present

## 2017-10-06 DIAGNOSIS — Z794 Long term (current) use of insulin: Secondary | ICD-10-CM | POA: Diagnosis not present

## 2017-10-06 DIAGNOSIS — I5032 Chronic diastolic (congestive) heart failure: Secondary | ICD-10-CM | POA: Diagnosis not present

## 2017-10-06 DIAGNOSIS — K219 Gastro-esophageal reflux disease without esophagitis: Secondary | ICD-10-CM | POA: Diagnosis not present

## 2017-10-06 DIAGNOSIS — E1143 Type 2 diabetes mellitus with diabetic autonomic (poly)neuropathy: Secondary | ICD-10-CM | POA: Diagnosis not present

## 2017-10-06 DIAGNOSIS — Z9181 History of falling: Secondary | ICD-10-CM | POA: Diagnosis not present

## 2017-10-11 ENCOUNTER — Other Ambulatory Visit: Payer: Self-pay | Admitting: Internal Medicine

## 2017-10-11 ENCOUNTER — Other Ambulatory Visit: Payer: Self-pay | Admitting: Family Medicine

## 2017-10-13 DIAGNOSIS — I5032 Chronic diastolic (congestive) heart failure: Secondary | ICD-10-CM | POA: Diagnosis not present

## 2017-10-13 DIAGNOSIS — K219 Gastro-esophageal reflux disease without esophagitis: Secondary | ICD-10-CM | POA: Diagnosis not present

## 2017-10-13 DIAGNOSIS — Z794 Long term (current) use of insulin: Secondary | ICD-10-CM | POA: Diagnosis not present

## 2017-10-13 DIAGNOSIS — M150 Primary generalized (osteo)arthritis: Secondary | ICD-10-CM | POA: Diagnosis not present

## 2017-10-13 DIAGNOSIS — I110 Hypertensive heart disease with heart failure: Secondary | ICD-10-CM | POA: Diagnosis not present

## 2017-10-13 DIAGNOSIS — K3184 Gastroparesis: Secondary | ICD-10-CM | POA: Diagnosis not present

## 2017-10-13 DIAGNOSIS — E1143 Type 2 diabetes mellitus with diabetic autonomic (poly)neuropathy: Secondary | ICD-10-CM | POA: Diagnosis not present

## 2017-10-13 DIAGNOSIS — Z85828 Personal history of other malignant neoplasm of skin: Secondary | ICD-10-CM | POA: Diagnosis not present

## 2017-10-13 DIAGNOSIS — Z9181 History of falling: Secondary | ICD-10-CM | POA: Diagnosis not present

## 2017-10-13 DIAGNOSIS — M109 Gout, unspecified: Secondary | ICD-10-CM | POA: Diagnosis not present

## 2017-10-18 ENCOUNTER — Telehealth: Payer: Self-pay

## 2017-10-18 ENCOUNTER — Other Ambulatory Visit: Payer: Medicare Other

## 2017-10-18 NOTE — Telephone Encounter (Signed)
Pt calling to speak to Dr. Valentina Lucks about her sugar. Please call her on [phone]. Ottis Stain, CMA

## 2017-10-18 NOTE — Telephone Encounter (Signed)
Pt would like to speak to Dr. Juleen China. New Rx for blood pressure has been called into her pharmacy and pt doesn't understand why? "My blood pressure is perfect".  Pt would like to discuss this with Dr. Juleen China. Please call her on [phone]. Ottis Stain, CMA

## 2017-10-19 DIAGNOSIS — E119 Type 2 diabetes mellitus without complications: Secondary | ICD-10-CM | POA: Diagnosis not present

## 2017-10-20 DIAGNOSIS — Z9181 History of falling: Secondary | ICD-10-CM | POA: Diagnosis not present

## 2017-10-20 DIAGNOSIS — I5032 Chronic diastolic (congestive) heart failure: Secondary | ICD-10-CM | POA: Diagnosis not present

## 2017-10-20 DIAGNOSIS — I110 Hypertensive heart disease with heart failure: Secondary | ICD-10-CM | POA: Diagnosis not present

## 2017-10-20 DIAGNOSIS — K219 Gastro-esophageal reflux disease without esophagitis: Secondary | ICD-10-CM | POA: Diagnosis not present

## 2017-10-20 DIAGNOSIS — Z85828 Personal history of other malignant neoplasm of skin: Secondary | ICD-10-CM | POA: Diagnosis not present

## 2017-10-20 DIAGNOSIS — E1143 Type 2 diabetes mellitus with diabetic autonomic (poly)neuropathy: Secondary | ICD-10-CM | POA: Diagnosis not present

## 2017-10-20 DIAGNOSIS — M109 Gout, unspecified: Secondary | ICD-10-CM | POA: Diagnosis not present

## 2017-10-20 DIAGNOSIS — M150 Primary generalized (osteo)arthritis: Secondary | ICD-10-CM | POA: Diagnosis not present

## 2017-10-20 DIAGNOSIS — Z794 Long term (current) use of insulin: Secondary | ICD-10-CM | POA: Diagnosis not present

## 2017-10-20 DIAGNOSIS — K3184 Gastroparesis: Secondary | ICD-10-CM | POA: Diagnosis not present

## 2017-10-20 NOTE — Telephone Encounter (Signed)
Patient reported CBG on two days prior of 113 which she managed correctly.   She admitted that she believed this lower readings was due to "not eating right".   She denied any hypoglycemic symptoms during "low reading".   She also reported her blood pressure readings were under good control at this time.   She has a follow up appointment scheduled with me on Monday 11/5.  Completed call with plan to talk with her more at that time.

## 2017-10-21 ENCOUNTER — Ambulatory Visit (HOSPITAL_BASED_OUTPATIENT_CLINIC_OR_DEPARTMENT_OTHER): Payer: Medicare Other | Attending: Physician Assistant | Admitting: Cardiology

## 2017-10-21 VITALS — Ht 60.0 in | Wt 195.0 lb

## 2017-10-21 DIAGNOSIS — Z79899 Other long term (current) drug therapy: Secondary | ICD-10-CM | POA: Insufficient documentation

## 2017-10-21 DIAGNOSIS — I10 Essential (primary) hypertension: Secondary | ICD-10-CM | POA: Insufficient documentation

## 2017-10-21 DIAGNOSIS — R0683 Snoring: Secondary | ICD-10-CM | POA: Diagnosis not present

## 2017-10-21 DIAGNOSIS — G4733 Obstructive sleep apnea (adult) (pediatric): Secondary | ICD-10-CM | POA: Diagnosis not present

## 2017-10-22 NOTE — Procedures (Signed)
Patient Name: Rebecca Walter, Rebecca Walter Study Date: 10/21/2017 Gender: Female D.O.B: [birth year] Age (years): 54 Referring Provider: Melina Copa Height (inches): 53 Interpreting Physician: Fransico Him MD, ABSM Weight (lbs): 195 RPSGT: Earney Hamburg BMI: 38 MRN: [id number] Neck Size: 17.50  CLINICAL INFORMATION Sleep Study Type: Split Night CPAP  Indication for sleep study: Hypertension  Epworth Sleepiness Score: 3  SLEEP STUDY TECHNIQUE As per the AASM Manual for the Scoring of Sleep and Associated Events v2.3 (April 2016) with a hypopnea requiring 4% desaturations.  The channels recorded and monitored were frontal, central and occipital EEG, electrooculogram (EOG), submentalis EMG (chin), nasal and oral airflow, thoracic and abdominal wall motion, anterior tibialis EMG, snore microphone, electrocardiogram, and pulse oximetry. Continuous positive airway pressure (CPAP) was initiated when the patient met split night criteria and was titrated according to treat sleep-disordered breathing.  MEDICATIONS Medications self-administered by patient taken the night of the study : ATIVAN, TYLENOL, CANDESARTAN, REGLAN  RESPIRATORY PARAMETERS Diagnostic Total AHI (/hr): 18.0  RDI (/hr):22.8   CA Index (/hr): 0.0 REM AHI (/hr): N/A  NREM AHI (/hr):18.0  Supine AHI (/hr):18.0  Non-supine AHI (/hr): N/A Min O2 Sat (%):87.00  Mean O2 (%): 89.83  Time below 88% (min):17.4    Titration Optimal Pressure (cm):11  AHI at Optimal Pressure (/hr):0.0 Min O2 at Optimal Pressure (%):87.0 Supine % at Optimal (%):100  Sleep % at Optimal (%):97    SLEEP ARCHITECTURE The recording time for the entire night was 394.6 minutes.  During a baseline period of 217.5 minutes, the patient slept for 126.5 minutes in REM and nonREM, yielding a sleep efficiency of 58.1%. Sleep onset after lights out was 73.9 minutes with a REM latency of N/A minutes. The patient spent 3.95% of the night in stage N1 sleep,  96.05% in stage N2 sleep, 0.00% in stage N3 and 0.00% in REM.  During the titration period of 164.9 minutes, the patient slept for 125.3 minutes in REM and nonREM, yielding a sleep efficiency of 75.9%. Sleep onset after CPAP initiation was 36.7 minutes with a REM latency of 74.5 minutes. The patient spent 2.39% of the night in stage N1 sleep, 71.26% in stage N2 sleep, 0.00% in stage N3 and 26.34% in REM.  CARDIAC DATA The 2 lead EKG demonstrated sinus rhythm. The mean heart rate was 65.68 beats per minute. Other EKG findings include: None.  LEG MOVEMENT DATA The total Periodic Limb Movements of Sleep (PLMS) were 0. The PLMS index was 0.00 .  IMPRESSIONS - Moderate obstructive sleep apnea occurred during the diagnostic portion of the study(AHI = 18.0/hour). An optimal PAP pressure was selected for this patient ( 11 cm of water) - No significant central sleep apnea occurred during the diagnostic portion of the study (CAI = 0.0/hour). - Mild oxygen desaturation was noted during the diagnostic portion of the study (Min O2 = 87.00%). - The patient snored with soft snoring volume during the diagnostic portion of the study. - No cardiac abnormalities were noted during this study. - Clinically significant periodic limb movements did not occur during sleep.  DIAGNOSIS - Obstructive Sleep Apnea (327.23 [G47.33 ICD-10])  RECOMMENDATIONS - Trial of CPAP therapy on 11 cm H2O with a Small size Fisher&Paykel Full Face Mask Simplus mask and heated humidification. - Avoid alcohol, sedatives and other CNS depressants that may worsen sleep apnea and disrupt normal sleep architecture. - Sleep hygiene should be reviewed to assess factors that may improve sleep quality. - Weight management and regular exercise should be initiated or  continued. - Return to Sleep Center for re-evaluation after 10 weeks of therapy  [place]  ELECTRONICALLY SIGNED ON:  10/22/2017,  9:20 PM [place] PH: [phone]   FX: [phone] [place]

## 2017-10-24 ENCOUNTER — Encounter: Payer: Self-pay | Admitting: Pharmacist

## 2017-10-24 ENCOUNTER — Ambulatory Visit (INDEPENDENT_AMBULATORY_CARE_PROVIDER_SITE_OTHER): Payer: Medicare Other | Admitting: Pharmacist

## 2017-10-24 ENCOUNTER — Other Ambulatory Visit: Payer: Medicare Other

## 2017-10-24 DIAGNOSIS — I5032 Chronic diastolic (congestive) heart failure: Secondary | ICD-10-CM | POA: Diagnosis not present

## 2017-10-24 DIAGNOSIS — I110 Hypertensive heart disease with heart failure: Secondary | ICD-10-CM

## 2017-10-24 DIAGNOSIS — R7989 Other specified abnormal findings of blood chemistry: Secondary | ICD-10-CM | POA: Diagnosis not present

## 2017-10-24 DIAGNOSIS — Z794 Long term (current) use of insulin: Secondary | ICD-10-CM | POA: Diagnosis not present

## 2017-10-24 DIAGNOSIS — E118 Type 2 diabetes mellitus with unspecified complications: Secondary | ICD-10-CM

## 2017-10-24 MED ORDER — INSULIN ASPART 100 UNIT/ML FLEXPEN: PEN_INJECTOR | SUBCUTANEOUS | 3 refills | Status: DC

## 2017-10-24 NOTE — Patient Instructions (Addendum)
Thank you for coming to see [place] today!  1. We will not make any changes to your diabetes or hypertension medications today.  2. Follow up with Dr. Juleen China late this week or next week to discuss the results of your labs today and a potential thyroid issue.

## 2017-10-24 NOTE — Assessment & Plan Note (Signed)
Diabetes longstanding, currently controlled. Patient reports hypoglycemic events and is able to verbalize appropriate hypoglycemia management plan. Patient reports adherence with medication. Next A1C anticipated 01/2018.  Continue current medications

## 2017-10-24 NOTE — Assessment & Plan Note (Signed)
Hypertension longstanding currently controlled. BP today is 157/54; patient reports BP at home is usually in the 130s/50-60s.  Patient reports adherence with medication.  Continue current medications

## 2017-10-24 NOTE — Progress Notes (Signed)
   S:    Chief Complaint  Patient presents with  . Medication Management    diabetes, hypertension   Patient arrives in good spirits, ambulating with the assistance of a cane. Presents for diabetes and hypertension evaluation, education, and management. Patient was last seen by Primary Care Provider on 09/05/2017. Patient reports that she recently had a sleep study done and is to start using CPAP in the near future. She is excited as improvement in her sleep may also improve her diabetes and hypertension. She also raised questions about her thyroid, and whether or not she requires treatment for a thyroid condition.  Patient reports adherence with medications.  Current diabetes medications include: Lantus 70 units SQ qAM, Novolog 20 units SQ TID with meals, metformin XR 500 mg PO BID Current hypertension medications include: amlodipine 10mg  PO daily, candesartan 32 mg PO qHS, spironolactone 12.5 mg PO daily, torsemide 20 mg PO daily  Patient reports hypoglycemic events. This is typically happens in the middle of the afternoon. She had a reading of 64 once after her afternoon meal. She had another reading of 84.   O:  Physical Exam  Constitutional: She appears well-developed and well-nourished.   Review of Systems  Cardiovascular: Positive for leg swelling.  All other systems reviewed and are negative.  Lab Results  Component Value Date   HGBA1C 7.7 (H) 07/16/2017   Vitals:   10/24/17 1130  BP: (!) 157/54  Pulse: 84   Home fasting CBG: typically in the high 100s to low 200s in the morning, around 150 before bedtime (from meter)  2 hour post-prandial/random CBG: range 130s to low 200s (from meter) Patient has had some higher readings recently (high 200s to low 300s) which she attributes to stress 2/2 her recent sleep study.  10 year ASCVD risk: 60.8%.  TSH: 7.92 (08/26/17), 9.61 (09/05/17) Free T4: 0.96 (09/05/17)  A/P: Diabetes longstanding, currently controlled. Patient reports  hypoglycemic events and is able to verbalize appropriate hypoglycemia management plan. Patient reports adherence with medication. Next A1C anticipated 01/2018.  Continue current medications  ASCVD risk greater than 7.5%.  - Continue ASA 81mg  PO daily and lovastatin 40mg  PO daily  Hypertension longstanding currently controlled. BP today is 157/54; patient reports BP at home is usually in the 130s/50-60s.  Patient reports adherence with medication.  Continue current medications  TSH was elevated x2 in September of this year, however free T4 was WNL. TSH, free T4, and free T3 drawn today.  Follow up with Dr. Juleen China regarding lab results and potential hypothyroid diagnosis/treatment  Written patient instructions provided. Total time in face to face counseling 30 minutes. Follow up in pharmacy clinic as needed. Patient seen with Drusilla Kanner, PharmD Candidate, and Leeroy Cha, PharmD PGY-1 Resident.

## 2017-10-25 LAB — TSH: TSH: 6.85 u[IU]/mL — AB (ref 0.450–4.500)

## 2017-10-25 LAB — T3, FREE: T3 FREE: 2.9 pg/mL (ref 2.0–4.4)

## 2017-10-25 LAB — T4, FREE: Free T4: 0.99 ng/dL (ref 0.82–1.77)

## 2017-10-25 NOTE — Progress Notes (Signed)
Patient ID: Rebecca Walter, female   DOB: 06/20/1941, 76 y.o.   MRN: [id number] Reviewed: Agree with Dr. Graylin Shiver documentation and management.

## 2017-10-27 ENCOUNTER — Telehealth: Payer: Self-pay | Admitting: *Deleted

## 2017-10-27 NOTE — Telephone Encounter (Signed)
LMTCB

## 2017-10-27 NOTE — Telephone Encounter (Signed)
Informed patient of sleep study results and patient understanding was verbalized. Patient understands she has significant sleep apnea and had successful CPAP titration. Patient understands Dr Radford Pax has ordered her a CPAP unit. Patient understands she will be contacted by [facility] to set up her cpap. She understands to call if [facility] does not contact her with new setup in a timely manner. She understands she will be called once confirmation has been received from [facility] that she has received her new machine to schedule 10 week follow up appointment.  [facility] notified of new cpap order  Please add to airview She was grateful for the call and thanked me

## 2017-10-27 NOTE — Telephone Encounter (Signed)
-----   Message from Sueanne Margarita, MD sent at 10/22/2017  9:30 PM EDT ----- Please let patient know that they have significant sleep apnea and had successful CPAP titration and will be set up with CPAP unit.  Please let DME know that order is in EPIC.  Please set patient up for OV in 10 weeks

## 2017-10-27 NOTE — Telephone Encounter (Signed)
New Message ° ° pt verbalized that she is returning call for rn °

## 2017-10-27 NOTE — Telephone Encounter (Signed)
-----   Message from Charlie Pitter, [place] sent at 10/24/2017  5:45 AM EST ----- Please get patient set up with Dr. Radford Pax for sleep apnea management. Thanks ----- Message ----- From: Charlie Pitter, PA-C Sent: 10/22/2017   9:30 PM To: Charlie Pitter, PA-C, #

## 2017-10-31 ENCOUNTER — Telehealth: Payer: Self-pay | Admitting: Internal Medicine

## 2017-10-31 ENCOUNTER — Other Ambulatory Visit: Payer: Self-pay | Admitting: Internal Medicine

## 2017-10-31 DIAGNOSIS — F411 Generalized anxiety disorder: Secondary | ICD-10-CM

## 2017-10-31 NOTE — Telephone Encounter (Signed)
Pt is calling for a refill on her Ativan. Please call in right away. jw

## 2017-11-01 ENCOUNTER — Other Ambulatory Visit: Payer: Self-pay | Admitting: Family Medicine

## 2017-11-01 DIAGNOSIS — I10 Essential (primary) hypertension: Secondary | ICD-10-CM

## 2017-11-01 NOTE — Telephone Encounter (Signed)
I will be out of the clinic until Thursday, 11/15. I will leave a paper script there. Please remind Ms. Naser that she needs to give AT LEAST 48 hours notice for paper scripts.   Phill Myron, D.O. 11/01/2017, 4:42 PM PGY-3, [place]

## 2017-11-03 ENCOUNTER — Other Ambulatory Visit: Payer: Self-pay | Admitting: Internal Medicine

## 2017-11-03 DIAGNOSIS — I110 Hypertensive heart disease with heart failure: Secondary | ICD-10-CM | POA: Diagnosis not present

## 2017-11-03 DIAGNOSIS — Z9181 History of falling: Secondary | ICD-10-CM | POA: Diagnosis not present

## 2017-11-03 DIAGNOSIS — M150 Primary generalized (osteo)arthritis: Secondary | ICD-10-CM | POA: Diagnosis not present

## 2017-11-03 DIAGNOSIS — F411 Generalized anxiety disorder: Secondary | ICD-10-CM

## 2017-11-03 DIAGNOSIS — M109 Gout, unspecified: Secondary | ICD-10-CM | POA: Diagnosis not present

## 2017-11-03 DIAGNOSIS — E1143 Type 2 diabetes mellitus with diabetic autonomic (poly)neuropathy: Secondary | ICD-10-CM | POA: Diagnosis not present

## 2017-11-03 DIAGNOSIS — Z85828 Personal history of other malignant neoplasm of skin: Secondary | ICD-10-CM | POA: Diagnosis not present

## 2017-11-03 DIAGNOSIS — Z794 Long term (current) use of insulin: Secondary | ICD-10-CM | POA: Diagnosis not present

## 2017-11-03 DIAGNOSIS — K219 Gastro-esophageal reflux disease without esophagitis: Secondary | ICD-10-CM | POA: Diagnosis not present

## 2017-11-03 DIAGNOSIS — I5032 Chronic diastolic (congestive) heart failure: Secondary | ICD-10-CM | POA: Diagnosis not present

## 2017-11-03 DIAGNOSIS — K3184 Gastroparesis: Secondary | ICD-10-CM | POA: Diagnosis not present

## 2017-11-03 MED ORDER — LORAZEPAM 1 MG PO TABS: 1.0000 mg | ORAL_TABLET | Freq: Two times a day (BID) | ORAL | 2 refills | Status: DC | PRN

## 2017-11-03 NOTE — Progress Notes (Signed)
Called in prescription for Ativan because were unable to locate paper Rx. Paper Rx located after called in script and was shredded.   Phill Myron, D.O. 11/03/2017, 3:56 PM PGY-3, [place]

## 2017-11-03 NOTE — Progress Notes (Signed)
Pt informed. Fleeger, Jessica Dawn, CMA  

## 2017-11-03 NOTE — Telephone Encounter (Signed)
No script up front.  Will check with provider.  Diondra Pines, Salome Spotted, CMA

## 2017-11-03 NOTE — Progress Notes (Signed)
Rx for Ativan left up front.   Phill Myron, D.O. 11/03/2017, 3:07 PM PGY-3, [place]

## 2017-11-04 ENCOUNTER — Ambulatory Visit: Payer: Medicare Other | Admitting: Internal Medicine

## 2017-11-07 ENCOUNTER — Ambulatory Visit: Payer: Medicare Other | Admitting: Internal Medicine

## 2017-11-08 ENCOUNTER — Telehealth: Payer: Self-pay | Admitting: Internal Medicine

## 2017-11-08 NOTE — Telephone Encounter (Signed)
Will forward to medical records 

## 2017-11-08 NOTE — Telephone Encounter (Signed)
Dawn RN [facility])  is calling to get the Ejection Fraction on Mrs.Lenahan. Please fax to [phone]

## 2017-11-08 NOTE — Telephone Encounter (Signed)
Left VM with Ovidio Kin with [facility] asking her to fax over EF% form so records can be faxed back.

## 2017-11-09 DIAGNOSIS — M109 Gout, unspecified: Secondary | ICD-10-CM | POA: Diagnosis not present

## 2017-11-09 DIAGNOSIS — Z794 Long term (current) use of insulin: Secondary | ICD-10-CM | POA: Diagnosis not present

## 2017-11-09 DIAGNOSIS — K219 Gastro-esophageal reflux disease without esophagitis: Secondary | ICD-10-CM | POA: Diagnosis not present

## 2017-11-09 DIAGNOSIS — Z9181 History of falling: Secondary | ICD-10-CM | POA: Diagnosis not present

## 2017-11-09 DIAGNOSIS — K3184 Gastroparesis: Secondary | ICD-10-CM | POA: Diagnosis not present

## 2017-11-09 DIAGNOSIS — I5032 Chronic diastolic (congestive) heart failure: Secondary | ICD-10-CM | POA: Diagnosis not present

## 2017-11-09 DIAGNOSIS — M150 Primary generalized (osteo)arthritis: Secondary | ICD-10-CM | POA: Diagnosis not present

## 2017-11-09 DIAGNOSIS — I110 Hypertensive heart disease with heart failure: Secondary | ICD-10-CM | POA: Diagnosis not present

## 2017-11-09 DIAGNOSIS — E1143 Type 2 diabetes mellitus with diabetic autonomic (poly)neuropathy: Secondary | ICD-10-CM | POA: Diagnosis not present

## 2017-11-09 DIAGNOSIS — Z85828 Personal history of other malignant neoplasm of skin: Secondary | ICD-10-CM | POA: Diagnosis not present

## 2017-11-13 ENCOUNTER — Other Ambulatory Visit: Payer: Self-pay | Admitting: Family Medicine

## 2017-11-13 DIAGNOSIS — I10 Essential (primary) hypertension: Secondary | ICD-10-CM

## 2017-11-14 ENCOUNTER — Other Ambulatory Visit: Payer: Self-pay | Admitting: Internal Medicine

## 2017-11-17 DIAGNOSIS — G4733 Obstructive sleep apnea (adult) (pediatric): Secondary | ICD-10-CM | POA: Diagnosis not present

## 2017-11-21 ENCOUNTER — Other Ambulatory Visit: Payer: Self-pay

## 2017-11-21 ENCOUNTER — Encounter: Payer: Self-pay | Admitting: Internal Medicine

## 2017-11-21 ENCOUNTER — Ambulatory Visit: Payer: Medicare Other | Admitting: Internal Medicine

## 2017-11-21 DIAGNOSIS — Z23 Encounter for immunization: Secondary | ICD-10-CM

## 2017-11-21 DIAGNOSIS — R7989 Other specified abnormal findings of blood chemistry: Secondary | ICD-10-CM

## 2017-11-21 HISTORY — DX: Other specified abnormal findings of blood chemistry: R79.89

## 2017-11-21 NOTE — Progress Notes (Signed)
Subjective:    Rebecca Walter - 76 y.o. female MRN [id number]  Date of birth: 12/15/1941  HPI  Tyniya C Rusk is here for follow up. She is in good spirits and reports that she is feeling much better since getting her BP under control and starting CPAP trial for OSA.   Elevated TSH: TSH obtained 9/7 by cardiology and was found to be elevated 7.9. Repeat TSH elevated to 9.6 on 9/17 with normal T3/T4. Repeat TSH 11/5 was 6.8 with normal T3/T4. Weight has remained essentially unchanged. No temperature intolerance or skin/nail/hair changes.   Chronic HTN Disease Monitoring:  Home BP Monitoring - yes, 120-130/60-70s Chest pain- no  Dyspnea- no Headache - no  Medications: amlodipine 10mg  PO daily, candesartan 32 mg PO qHS, spironolactone 12.5 mg PO daily, torsemide 20 mg PO daily Compliance- yes Lightheadedness- no  Edema- no   Preventitive Healthcare:  Exercise: yes, has started walking daily    Diet Pattern: cooking more home cooked meals now that she has more energy     Health Maintenance Due  Topic Date Due  . TETANUS/TDAP  10/20/2012  . FOOT EXAM  09/08/2017    -  reports that  has never smoked. she has never used smokeless tobacco. - Review of Systems: Per HPI. - Past Medical History: Patient Active Problem List   Diagnosis Date Noted  . Elevated TSH 11/21/2017  . Hypertensive heart disease with CHF (congestive heart failure) ([place])   . Chronic diastolic CHF (congestive heart failure) ([place]) 07/15/2017  . Mild aortic stenosis 06/19/2017  . Mild mitral regurgitation 06/19/2017  . Moderate pulmonary valve insufficiency 06/19/2017  . Lumbar back pain with radiculopathy affecting right lower extremity 10/29/2016  . Allergic rhinitis 03/22/2011  . INSOMNIA 09/16/2009  . GASTROPARESIS 08/12/2009  . Anxiety 10/18/2008  . DYSKINESIA, ESOPHAGUS 09/13/2007  . CARCINOMA, BASAL CELL 05/26/2007  . Diabetes mellitus, type II ([place]) 02/16/2007  . HLD  (hyperlipidemia) 02/16/2007  . OBESITY, NOS 02/16/2007  . GASTROESOPHAGEAL REFLUX, NO ESOPHAGITIS 02/16/2007  . OSTEOARTHRITIS, MULTI SITES 02/16/2007  . OSTEOPENIA 02/16/2007  . Gout 02/16/2007   - Medications: reviewed and updated   Objective:   Physical Exam BP 130/75 (BP Location: Left Arm, Patient Position: Sitting, Cuff Size: Large)   Pulse 91   Temp 98.5 F (36.9 C) (Oral)   Ht 5' (1.524 m)   Wt 199 lb (90.3 kg)   LMP  (LMP Unknown)   SpO2 96%   BMI 38.86 kg/m  Gen: NAD, alert, cooperative with exam, well-appearing   Diabetic Foot Check -  Appearance - no lesions, ulcers or calluses Skin - no unusual pallor or redness Monofilament testing - normal bilaterally  Right - Great toe, medial, central, lateral ball and posterior foot intact Left - Great toe, medial, central, lateral ball and posterior foot intact     Assessment & Plan:   Elevated TSH TSH has been elevated on last three lab checks but has had normal T3/T4 with each lab check. TSH does not have a consistent up trend and was down to 6 on recent labs. Patient meets criteria for subclinical hypothyroidism and age also likely contributing to increased TSH. Have discussed with patient that there is no evidence that shows that treating subclinical hypothyroidism with Synthroid improves symptoms or long term outcomes. Would recommend repeating thyroid studies in 3-6 months to monitor for progression to hypothyroidism.   Hypertensive heart disease with CHF (congestive heart failure) ([place]) Well controlled at office visit and on  home readings. Continue current medication regimen. Suspect management of OSA will help with long term BP control as well. Patient has started a walking regimen and is working to improve diet. Follow up in 2-3 months.     Phill Myron, D.O. 11/21/2017, 3:25 PM PGY-3, [place]

## 2017-11-21 NOTE — Assessment & Plan Note (Signed)
TSH has been elevated on last three lab checks but has had normal T3/T4 with each lab check. TSH does not have a consistent up trend and was down to 6 on recent labs. Patient meets criteria for subclinical hypothyroidism and age also likely contributing to increased TSH. Have discussed with patient that there is no evidence that shows that treating subclinical hypothyroidism with Synthroid improves symptoms or long term outcomes. Would recommend repeating thyroid studies in 3-6 months to monitor for progression to hypothyroidism.

## 2017-11-21 NOTE — Assessment & Plan Note (Signed)
Well controlled at office visit and on home readings. Continue current medication regimen. Suspect management of OSA will help with long term BP control as well. Patient has started a walking regimen and is working to improve diet. Follow up in 2-3 months.

## 2017-11-21 NOTE — Addendum Note (Signed)
Addended by: Leonia Corona R on: 11/21/2017 03:54 PM   Modules accepted: Orders

## 2017-11-21 NOTE — Patient Instructions (Signed)
Please return in February for your next check up. You look great today and I am so glad you are feeling better.

## 2017-11-22 ENCOUNTER — Other Ambulatory Visit: Payer: Self-pay | Admitting: Internal Medicine

## 2017-11-22 ENCOUNTER — Telehealth: Payer: Self-pay | Admitting: Internal Medicine

## 2017-11-22 DIAGNOSIS — E118 Type 2 diabetes mellitus with unspecified complications: Secondary | ICD-10-CM

## 2017-11-22 NOTE — Telephone Encounter (Signed)
Pt takes OTC zantac( 150 mg). She is wondering if a RX would be cheaper. If so please call it into [facility] on [address]..  Please let pt know

## 2017-11-23 ENCOUNTER — Telehealth: Payer: Self-pay | Admitting: Internal Medicine

## 2017-11-23 DIAGNOSIS — I5032 Chronic diastolic (congestive) heart failure: Secondary | ICD-10-CM | POA: Diagnosis not present

## 2017-11-23 DIAGNOSIS — K219 Gastro-esophageal reflux disease without esophagitis: Secondary | ICD-10-CM | POA: Diagnosis not present

## 2017-11-23 DIAGNOSIS — I110 Hypertensive heart disease with heart failure: Secondary | ICD-10-CM | POA: Diagnosis not present

## 2017-11-23 DIAGNOSIS — Z9181 History of falling: Secondary | ICD-10-CM | POA: Diagnosis not present

## 2017-11-23 DIAGNOSIS — Z794 Long term (current) use of insulin: Secondary | ICD-10-CM

## 2017-11-23 DIAGNOSIS — Z85828 Personal history of other malignant neoplasm of skin: Secondary | ICD-10-CM | POA: Diagnosis not present

## 2017-11-23 DIAGNOSIS — M109 Gout, unspecified: Secondary | ICD-10-CM | POA: Diagnosis not present

## 2017-11-23 DIAGNOSIS — M150 Primary generalized (osteo)arthritis: Secondary | ICD-10-CM | POA: Diagnosis not present

## 2017-11-23 DIAGNOSIS — E1143 Type 2 diabetes mellitus with diabetic autonomic (poly)neuropathy: Secondary | ICD-10-CM | POA: Diagnosis not present

## 2017-11-23 DIAGNOSIS — E118 Type 2 diabetes mellitus with unspecified complications: Secondary | ICD-10-CM

## 2017-11-23 DIAGNOSIS — K3184 Gastroparesis: Secondary | ICD-10-CM | POA: Diagnosis not present

## 2017-11-23 NOTE — Telephone Encounter (Signed)
Patient will need to inquire with her insurance company about the cost of Zantac. If it is cheaper, I am happy to send it into her pharmacy.   Phill Myron, D.O. 11/23/2017, 1:19 PM PGY-3, [place]

## 2017-11-23 NOTE — Telephone Encounter (Signed)
Pt called wanting a refill on her Novolog. Please advise

## 2017-11-24 MED ORDER — INSULIN ASPART 100 UNIT/ML FLEXPEN: PEN_INJECTOR | SUBCUTANEOUS | 3 refills | Status: DC

## 2017-11-24 NOTE — Telephone Encounter (Signed)
I have refilled her Novolog.   Phill Myron, D.O. 11/24/2017, 8:29 AM PGY-3, [place]

## 2017-11-24 NOTE — Telephone Encounter (Signed)
Contacted pt and informed her of below and she said that she had called in earlier and had already been told this and she contacted her insurance and it was going to be like $45.00 with them so she will continue to do OTC for this medication. Katharina Caper, April D, [place]

## 2017-11-30 ENCOUNTER — Telehealth: Payer: Self-pay | Admitting: Internal Medicine

## 2017-11-30 NOTE — Telephone Encounter (Signed)
Pt called and would like to have dr Valentina Lucks call her about her blood pressure. She said her bp has been running high and wants to know what she should do about it. She can't come in for an appt this week because of the roads. Please advise

## 2017-12-01 NOTE — Telephone Encounter (Signed)
Phone Call of Blood Pressure being elevated.  Reports elevations 170-174 a few days back.   Reports symptoms of being flushed at the time.  States this resolved. Blood pressure today was 144 and home health has had a few lower readings of 120/58.   States CPAP is working well, more energy.  No issues with device.   Asked if thyroid function may be making her "hyper" and we discussed waiting for additional evaluatiion of her symptoms.   Plans to follow up with our clinic in early 2019.

## 2017-12-02 ENCOUNTER — Telehealth: Payer: Self-pay | Admitting: Family Medicine

## 2017-12-02 NOTE — Telephone Encounter (Signed)
**  After Hours/ Emergency Line Call*  Received a call to report that Rebecca Walter she was worried about her BP. Legs felt weak after taking her pepcid this morning so she took her BP this evening SBP 128-138 and DBP 55-54. She was most worried about the bottom number. No lightheadedness or dizziness. No CP, SOB, vision changes, syncope, nausea, vomiting, diaphoresis. Per chart reviewm patient has had BPs into 120s/58 recently. Recommending holding evening dose of BP meds and recheck in the morning. Also discussed red flags for coming to ER for any syncopal or presyncopal symptoms. Patient voiced good understanding and appreciation. Of note, patient called back to ask for appointment this week with Dr. Valentina Lucks but no openings so was scheduled for appointment with PCP. Will forward to PCP.   Bufford Lope, DO PGY-2, [facility] 12/02/2017 7:52 PM

## 2017-12-04 ENCOUNTER — Telehealth: Payer: Self-pay | Admitting: Family Medicine

## 2017-12-04 ENCOUNTER — Other Ambulatory Visit: Payer: Self-pay | Admitting: Internal Medicine

## 2017-12-04 NOTE — Telephone Encounter (Signed)
**  After Hours/ Emergency Line Call*  Received a call to report that Rebecca Walter would like an appointment tomorrow instead of Tuesday to discuss blood pressure and potassium concerns. She called Friday evening due to diastolic BP readings in 51'G, which has been her baseline. She was advised to hold evening dose of BP medications. She did and felt ok. Today she is concerned because she felt flushed in her face. She called nurse line and was told candesartan may make her potassium high. She wants her potassium checked. She also wants EKG tomorrow. Endorsing all over weakness for past week at least.  Denying chest pain, SOB, speech change, fall. Appointment made for tomorrow, her PCP was unavailable.  Red flags discussed.  Will forward to PCP.  Steve Rattler, DO PGY-2, [facility]

## 2017-12-05 ENCOUNTER — Ambulatory Visit: Payer: Medicare Other | Admitting: Student

## 2017-12-05 ENCOUNTER — Other Ambulatory Visit: Payer: Self-pay

## 2017-12-05 ENCOUNTER — Encounter: Payer: Self-pay | Admitting: Student

## 2017-12-05 VITALS — BP 140/65 | HR 97 | Temp 98.1°F | Ht 60.0 in | Wt 193.6 lb

## 2017-12-05 DIAGNOSIS — I10 Essential (primary) hypertension: Secondary | ICD-10-CM | POA: Diagnosis not present

## 2017-12-05 DIAGNOSIS — E118 Type 2 diabetes mellitus with unspecified complications: Secondary | ICD-10-CM | POA: Diagnosis not present

## 2017-12-05 DIAGNOSIS — I5032 Chronic diastolic (congestive) heart failure: Secondary | ICD-10-CM

## 2017-12-05 DIAGNOSIS — Z794 Long term (current) use of insulin: Secondary | ICD-10-CM

## 2017-12-05 DIAGNOSIS — R531 Weakness: Secondary | ICD-10-CM

## 2017-12-05 LAB — POCT GLYCOSYLATED HEMOGLOBIN (HGB A1C): Hemoglobin A1C: 6.9

## 2017-12-05 NOTE — Progress Notes (Signed)
Subjective:    Rebecca Walter is a 76 y.o. old female here for weakness  HPI Weakness: for the last three days.  She says her diastolic blood pressure was in 50s at that time.  She called the after our pager and was advised to stop some of her blood pressure medications at that time.  She stopped her toresamide, aldactone and candesartan the following morning.  She has been feeling better since this morning.  She thinks her weakness is due to high potassium.  She is on Aldactone and candesartan.   Hypertension: She is on amlodipine, candesartan, furosemide and Aldactone.  Reports good compliance with her medication.  She checks her blood pressure at home.  She says her blood pressure is back up into 160s over 60s.  She says her SBP usually ranges from 120s-160s.  Her DBP ranges from 50s-60s.  Her blood pressure this morning is 165/65.  She denies chest pain, shortness of breath or edema.  She has been checking her weights daily.  She says her weight was 192 this morning.  This is her dry weight.  She reports using 3 pillows at baseline for him GERD.  She reports watching his salt intake. Tries to walk every morning but not since snow.   PMH/Problem List: has Diabetes mellitus, type II ([place]); HLD (hyperlipidemia); OBESITY, NOS; Anxiety; DYSKINESIA, ESOPHAGUS; GASTROESOPHAGEAL REFLUX, NO ESOPHAGITIS; GASTROPARESIS; OSTEOARTHRITIS, MULTI SITES; OSTEOPENIA; INSOMNIA; CARCINOMA, BASAL CELL; Gout; Allergic rhinitis; Lumbar back pain with radiculopathy affecting right lower extremity; Chronic diastolic CHF (congestive heart failure) ([place]); Hypertensive heart disease with CHF (congestive heart failure) ([place]); Mild aortic stenosis; Mild mitral regurgitation; Moderate pulmonary valve insufficiency; and Elevated TSH on their problem list.   has a past medical history of Anxiety, Arthritis, Chronic diastolic CHF (congestive heart failure) ([place]), Diabetes mellitus, GERD (gastroesophageal reflux disease), Gout,  Hyperlipidemia, Hypertension, Hypertensive heart disease with CHF (congestive heart failure) ([place]), Mild aortic stenosis (06/2017), Mild mitral regurgitation (06/2017), Moderate pulmonary valve insufficiency (06/2017), and Osteopenia.  FH:  Family History  Problem Relation Age of Onset  . Stroke Mother   . Heart disease Father   . Stroke Father   . Hypertension Father   . Cancer Sister   . Cancer Brother   . Heart disease Brother   . Diabetes Maternal Uncle     SH Social History   Tobacco Use  . Smoking status: Never Smoker  . Smokeless tobacco: Never Used  Substance Use Topics  . Alcohol use: No  . Drug use: No    Review of Systems Review of systems negative except for pertinent positives and negatives in history of present illness above.     Objective:     Vitals:   12/05/17 1338 12/05/17 1411  BP: (!) 148/62 140/65  Pulse: 97   Temp: 98.1 F (36.7 C)   TempSrc: Oral   SpO2: 97%   Weight: 193 lb 9.6 oz (87.8 kg)   Height: 5' (1.524 m)    Body mass index is 37.81 kg/m.  Physical Exam  GEN: appears well, no apparent distress. Oropharynx: mmm without erythema or exudation HEM: negative for cervical or periauricular lymphadenopathies CVS: RRR, nl s1 & s2, 1/6 SEM over RUSB and LUSB, trace edema RESP: no IWOB, good air movement bilaterally, CTAB GI: BS present & normal, soft, NTND MSK: no focal tenderness or notable swelling SKIN: no apparent skin lesion NEURO: alert and oiented appropriately, no gross deficits  PSYCH: euthymic mood with congruent affect    Assessment and  Plan:  1. Weakness: likely due to low blood pressure.  She says her diastolic blood pressure has been in the 50s when she felt weak.  She stopped some of her blood pressure medications for 1 day as advised.  She says her symptoms have resolved today.  She has no cardiopulmonary symptoms to think of CHF.  She is at her lowest weight.  No signs of fluid overload on exam.  Last hemoglobin  within normal limits.  She had mildly elevated TSH recently but free T4 was within normal limits.  Manage blood pressure as below.  Will check BMP, magnesium and BNP today.  2. HYPERTENSION, BENIGN SYSTEMIC: She tends to have low diastolic blood pressure in the 50s intermittently.  Your blood pressure is 140/65 here.  We will continue her medications (amlodipine 10 mg, candesartan 32 mg, torsemide 20 mg and Aldactone 12.5 mg).  I advised her to stop 1 or 2 of her blood pressure medications her blood pressure is low or she feels weak.   3. Type 2 diabetes mellitus with complication, with long-term current use of insulin (HCC) -A1c 6.9 today.  Follow-up with PCP  Return if symptoms worsen or fail to improve.  Mercy Riding, MD 12/05/17 Pager: [phone]

## 2017-12-05 NOTE — Patient Instructions (Addendum)
It was great seeing you today! We have addressed the following issues today  Dizziness: this could be due to the low blood pressure.  I suggest skipping 1 or 2 of your blood pressure medications depending on your blood pressure when you feel dizzy or lightheaded.  I also recommend checking the blood pressure when he have symptoms writing down the numbers.  Your goal blood pressure is less than 150/58mmHg.   If we did any lab work today, and the results require attention, either me or my nurse will get in touch with you. If everything is normal, you will get a letter in mail and a message via . If you don't hear from [place] in two weeks, please give [place] a call. Otherwise, we look forward to seeing you again at your next visit. If you have any questions or concerns before then, please call the clinic at [phone].  Please bring all your medications to every doctors visit  Sign up for My Chart to have easy access to your labs results, and communication with your Primary care physician.    Please check-out at the front desk before leaving the clinic.    Take Care,   Dr. Cyndia Skeeters

## 2017-12-06 ENCOUNTER — Telehealth: Payer: Self-pay | Admitting: Internal Medicine

## 2017-12-06 ENCOUNTER — Telehealth: Payer: Self-pay | Admitting: Student

## 2017-12-06 ENCOUNTER — Ambulatory Visit: Payer: Medicare Other | Admitting: Internal Medicine

## 2017-12-06 DIAGNOSIS — I10 Essential (primary) hypertension: Secondary | ICD-10-CM

## 2017-12-06 LAB — BASIC METABOLIC PANEL
BUN / CREAT RATIO: 26 (ref 12–28)
BUN: 25 mg/dL (ref 8–27)
CHLORIDE: 99 mmol/L (ref 96–106)
CO2: 25 mmol/L (ref 20–29)
CREATININE: 0.97 mg/dL (ref 0.57–1.00)
Calcium: 10.1 mg/dL (ref 8.7–10.3)
GFR calc Af Amer: 65 mL/min/{1.73_m2} (ref >59)
GFR calc non Af Amer: 56 mL/min/{1.73_m2} — ABNORMAL LOW (ref >59)
GLUCOSE: 162 mg/dL — AB (ref 65–99)
Potassium: 4.1 mmol/L (ref 3.5–5.2)
SODIUM: 141 mmol/L (ref 134–144)

## 2017-12-06 LAB — MAGNESIUM: MAGNESIUM: 2 mg/dL (ref 1.6–2.3)

## 2017-12-06 LAB — BRAIN NATRIURETIC PEPTIDE: BNP: 18.3 pg/mL (ref 0.0–100.0)

## 2017-12-06 NOTE — Telephone Encounter (Signed)
Called and discussed patient's BMP and magnesium level, which are normal except for mild hyperglycemia.  Patient states that her blood pressure is 114/50 this morning.  She denies symptoms.  At this point, I recommended stopping the candesartan or the amlodipine unless her systolic blood pressure is greater than 720 or diastolic blood pressure greater than 90.  I also recommended checking her blood pressure daily.

## 2017-12-06 NOTE — Telephone Encounter (Signed)
Pt wants to talk to dr Valentina Lucks about her bp medicatio n.  Her bottom number is running about 50.  She wants his approval before making changes to his medicine.

## 2017-12-07 NOTE — Telephone Encounter (Signed)
Even though pt has talked to dr about her bp, she still wants to talk to [facility] about her BP medication.

## 2017-12-07 NOTE — Telephone Encounter (Signed)
Reports low diastolic blood pressure -  multiple readings of 50s or 60s.  Reports blood pressure today is "good" (reports readings in the higher 60s.  Requested a reduction in Blood Pressure medication. Agreed to half the amlodipine from 10mg  to 5mg .   She will follow up as needed to reevaluate if additional BP adjustments are needed.

## 2017-12-08 NOTE — Telephone Encounter (Signed)
Pt said she would like Koval to call in the 5 mg amlodipine in to her pharmacy. She said its hard for her to cut her 10 mg ones in half. Please advise

## 2017-12-09 MED ORDER — AMLODIPINE BESYLATE 5 MG PO TABS: 5.0000 mg | ORAL_TABLET | Freq: Every day | ORAL | 5 refills | Status: DC

## 2017-12-09 NOTE — Telephone Encounter (Signed)
Confirmed prescription of 5mg  new dose for her was sent to pharmacy for pick up.  Patient was thankful.   Asked to  Place amlodipine 10mg  aside for potential use in the future.  Patient agreed with plan.  Follow up PRN.

## 2017-12-09 NOTE — Telephone Encounter (Signed)
Pt would like to get the 5 mg amolodipine because it is too hard to cut in half because it is a thick pill.  [facility] [address]

## 2017-12-12 ENCOUNTER — Telehealth: Payer: Self-pay | Admitting: Family Medicine

## 2017-12-12 NOTE — Telephone Encounter (Signed)
**  After Hours/ Emergency Line Call*  Received a call to report that Rebecca Walter had blood pressure of 126/55. Has not taken her amlodipine for today. On 5 mg daily, was recently cut down from 10 mg. Endorsing that she feels well.  Denying weakness, confusion, chest pain, shortness of breath.  Recommended that she skip her amlodipine this morning. Red flags discussed.  Will forward to PCP.  Steve Rattler, DO PGY-2, [facility]

## 2017-12-13 ENCOUNTER — Other Ambulatory Visit: Payer: Self-pay | Admitting: Internal Medicine

## 2017-12-15 ENCOUNTER — Telehealth: Payer: Self-pay | Admitting: Internal Medicine

## 2017-12-15 NOTE — Telephone Encounter (Signed)
After Hours Emergency Line:  Patient has been having low blood pressure. Her BP is 131/51 tonight. Her blood pressure usually runs in the 160s/60s. She states she feels weak when her diastolic BP is in the 01X. She recently spoke to Dr. Valentina Lucks over the phone, who decreased her Norvasc from 10mg  to 5mg . She is wanting to know if she can hold her Candesartan for today and restart it for tomorrow. I told her that her blood pressure is fine, but she can hold the Candesartan for tonight if she is feeling bad. I recommended that patient follow up with Dr. Valentina Lucks or her primary care doctor for her blood pressure.  Will forward to PCP.  Hyman Bible, MD PGY-3

## 2017-12-16 ENCOUNTER — Other Ambulatory Visit: Payer: Self-pay | Admitting: Internal Medicine

## 2017-12-16 ENCOUNTER — Telehealth: Payer: Self-pay | Admitting: Internal Medicine

## 2017-12-16 NOTE — Telephone Encounter (Signed)
Pt is requesting a refill on her Lantus Solostar be called in today. She has a ride around 2 pm to get there. jw

## 2017-12-17 DIAGNOSIS — G4733 Obstructive sleep apnea (adult) (pediatric): Secondary | ICD-10-CM | POA: Diagnosis not present

## 2017-12-20 MED ORDER — INSULIN GLARGINE 100 UNIT/ML SOLOSTAR PEN: 70.0000 [IU] | PEN_INJECTOR | SUBCUTANEOUS | 2 refills | Status: DC

## 2017-12-23 ENCOUNTER — Telehealth: Payer: Self-pay | Admitting: Internal Medicine
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
## 2017-12-26 NOTE — Telephone Encounter (Signed)
Follow up call of events over the last few weeks.   Denies any mouth, throat, allergy symptoms due to reaction last week.    States blood pressure is 098-119 systolic with diastolic 14-78.   Follow up in offfice with me on Thursday.   Patient accepted the treatment plan.

## 2017-12-29 ENCOUNTER — Encounter: Payer: Self-pay | Admitting: Pharmacist

## 2017-12-29 ENCOUNTER — Ambulatory Visit: Payer: Medicare Other | Admitting: Pharmacist

## 2017-12-29 DIAGNOSIS — Z794 Long term (current) use of insulin: Secondary | ICD-10-CM | POA: Diagnosis not present

## 2017-12-29 DIAGNOSIS — I110 Hypertensive heart disease with heart failure: Secondary | ICD-10-CM | POA: Diagnosis not present

## 2017-12-29 DIAGNOSIS — K219 Gastro-esophageal reflux disease without esophagitis: Secondary | ICD-10-CM | POA: Diagnosis not present

## 2017-12-29 DIAGNOSIS — E118 Type 2 diabetes mellitus with unspecified complications: Secondary | ICD-10-CM

## 2017-12-29 DIAGNOSIS — I5032 Chronic diastolic (congestive) heart failure: Secondary | ICD-10-CM

## 2017-12-29 NOTE — Patient Instructions (Addendum)
Great to see you!   Keep up the great job with your exercise and eating plans.   Next with with Dr. Juleen China in 4-6 weeks.

## 2017-12-29 NOTE — Assessment & Plan Note (Signed)
Diabetes - controlled - no changes to current regimen.   Metformin 500mg  BID, Lantus 70 units daily and Novolog 18-20 QAM and 9-10 QPM.  Encouraged continued low stress exercise and small portion eating plan.

## 2017-12-29 NOTE — Assessment & Plan Note (Signed)
Gastric reflux/hernia symptoms controlled with use of pantoprazole 40mg  BID and PRN ranitidine.   Discussed alternative regimen of scheduled H2 blocker 150mg  daily AND one dose daily of pantoprazole.   Asked her to attempt lower doses of PPIs to minimize long-term issues including fracture.

## 2017-12-29 NOTE — Progress Notes (Signed)
Patient ID: Rebecca Walter, female   DOB: 01-25-1941, 77 y.o.   MRN: [id number] Reviewed: Agree with Dr. Graylin Shiver documentation and management.

## 2017-12-29 NOTE — Progress Notes (Signed)
   S:    Patient arrives in good spirits, ambulating with use of cane.    Presents to the clinic for hypertension evaluation and diabetes.  Patient has been seen multiple times in Rx clinic for many years.    Patient reports adherence with medications.  However, a few noted PRN changes recently.  Minimal use of lorazepam QHS with use of baclofen QHS as this has helped decrease insomnia and improve sleep quality.  C-PAP has improved feeling of being refreshed in the morning.   Current BP Medications include:  Amlodipine 5mg   Spironolactone 12.5mg  and candesartan 32 daily.   Current DM Medications include:  Metformin 500mg  BID, Lantus 70 daily and Novlog 18-20 units in AM and 9-10 unitss in the PM.    Dietary habits include: portion control has been focus recently  Exercise of walking has been increased lately.   Denies hypoglycemia symptoms.    O:   Last 3 Office BP readings: BP Readings from Last 3 Encounters:  12/05/17 140/65  11/21/17 130/75  10/24/17 (!) 157/54    BMET    Component Value Date/Time   NA 141 12/05/2017 1451   K 4.1 12/05/2017 1451   CL 99 12/05/2017 1451   CO2 25 12/05/2017 1451   GLUCOSE 162 (H) 12/05/2017 1451   GLUCOSE 240 (H) 07/15/2017 2000   BUN 25 12/05/2017 1451   CREATININE 0.97 12/05/2017 1451   CREATININE 0.93 09/16/2016 1410   CALCIUM 10.1 12/05/2017 1451   GFRNONAA 56 (L) 12/05/2017 1451   GFRNONAA 70 02/06/2015 1418   GFRAA 65 12/05/2017 1451   GFRAA 81 02/06/2015 1418    A1c last:  6.9 A/P: Hypertension longstanding currently at goal on current medications.  Continued current 3 drug regimen (amlodipine 5mg ,  spironolactone 12.5mg  and candesartan 32 daily).  Encouraged patient to continue with eating and exercise plan.  Discussed how C-PAP and baclofen at night may be impacting her energy level and decreased percieved stress level.     Diabetes - controlled - no changes to current regimen.   Metformin 500mg  BID, Lantus 70 units daily  and Novolog 18-20 QAM and 9-10 QPM.  Encouraged continued low stress exercise and small portion eating plan.   Gastric reflux/hernia symptoms controlled with use of pantoprazole 40mg  BID and PRN ranitidine.   Discussed alternative regimen of scheduled H2 blocker 150mg  daily AND one dose daily of pantoprazole.   Asked her to attempt lower doses of PPIs to minimize long-term issues including fracture.   Results reviewed and written information provided.   Total time in face-to-face counseling 25 minutes.   F/U Clinic Visit with Dr. Juleen China PCP to discuss Thyroid tests and follow up on BP and DM issues.

## 2017-12-29 NOTE — Assessment & Plan Note (Signed)
Hypertension longstanding currently at goal on current medications.  Continued current 3 drug regimen (amlodipine 5mg ,  spironolactone 12.5mg  and candesartan 32 daily).  Encouraged patient to continue with eating and exercise plan.  Discussed how C-PAP and baclofen at night may be impacting her energy level and decreased percieved stress level.

## 2018-01-15 ENCOUNTER — Ambulatory Visit (HOSPITAL_COMMUNITY)
Admission: EM | Admit: 2018-01-15 | Discharge: 2018-01-15 | Disposition: A | Discharge status: Home / Self Care | Payer: Medicare Other | Attending: Family Medicine | Admitting: Family Medicine

## 2018-01-15 ENCOUNTER — Encounter (HOSPITAL_COMMUNITY): Payer: Self-pay | Admitting: Emergency Medicine

## 2018-01-15 DIAGNOSIS — T300 Burn of unspecified body region, unspecified degree: Secondary | ICD-10-CM

## 2018-01-15 MED ORDER — SILVER SULFADIAZINE 1 % EX CREA
TOPICAL_CREAM | Freq: Once | CUTANEOUS | Status: AC
Start: 2018-01-15 — End: 2018-01-15
  Administered 2018-01-15: 16:00:00 via TOPICAL

## 2018-01-15 MED ORDER — SILVER SULFADIAZINE 1 % EX CREA: 1.0000 "application " | TOPICAL_CREAM | Freq: Every day | CUTANEOUS | 0 refills | Status: DC
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
## 2018-01-15 NOTE — Discharge Instructions (Signed)
Apply cream once a day.  Try to keep blister intact to prevent infection Dressing for comfort. Return to be seen or see your primary care provider if develop increased pain, redness, drainage or concerns for infection.
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
## 2018-01-16 ENCOUNTER — Ambulatory Visit: Payer: Medicare Other | Admitting: Podiatry

## 2018-01-16 ENCOUNTER — Encounter: Payer: Self-pay | Admitting: Podiatry

## 2018-01-16 DIAGNOSIS — B351 Tinea unguium: Secondary | ICD-10-CM | POA: Diagnosis not present

## 2018-01-16 DIAGNOSIS — M79675 Pain in left toe(s): Secondary | ICD-10-CM | POA: Diagnosis not present

## 2018-01-16 DIAGNOSIS — M79674 Pain in right toe(s): Secondary | ICD-10-CM

## 2018-01-17 DIAGNOSIS — G4733 Obstructive sleep apnea (adult) (pediatric): Secondary | ICD-10-CM | POA: Diagnosis not present

## 2018-01-18 NOTE — Progress Notes (Signed)
Subjective:   Patient ID: Rebecca Walter, female   DOB: 77 y.o.   MRN: [id number]   HPI Patient presents with nail disease 1-5 both feet that are hard for her to take care of and states she is tried to trim them herself.  Patient does not smoke and likes to be active   Review of Systems  All other systems reviewed and are negative.       Objective:  Physical Exam  Constitutional: She appears well-developed and well-nourished.  Cardiovascular: Intact distal pulses.  Pulmonary/Chest: Effort normal.  Musculoskeletal: Normal range of motion.  Neurological: She is alert.  Skin: Skin is warm.  Nursing note and vitals reviewed.   Neurovascular status intact with patient noted to have thick yellow brittle nailbeds 1-5 both feet that are incurvated in the corners and tender when palpated.  Patient has good digital perfusion well oriented x3     Assessment:  Mycotic nail infection with discomfort and thickness 1-5 both feet with patient having diabetes under control     Plan:  H&P diabetic foot education rendered the patient and debrided nailbeds 1-5 both feet with no iatrogenic bleeding noted

## 2018-01-23 ENCOUNTER — Other Ambulatory Visit: Payer: Self-pay | Admitting: Internal Medicine

## 2018-01-23 DIAGNOSIS — I10 Essential (primary) hypertension: Secondary | ICD-10-CM

## 2018-01-23 MED ORDER — CANDESARTAN CILEXETIL 32 MG PO TABS: 32.0000 mg | ORAL_TABLET | Freq: Every day | ORAL | 1 refills | Status: DC

## 2018-01-27 ENCOUNTER — Other Ambulatory Visit: Payer: Self-pay

## 2018-01-27 ENCOUNTER — Ambulatory Visit: Payer: Medicare Other | Admitting: Internal Medicine

## 2018-01-27 ENCOUNTER — Encounter: Payer: Self-pay | Admitting: Internal Medicine

## 2018-01-27 VITALS — BP 138/70 | HR 70 | Temp 98.5°F | Ht 60.0 in | Wt 196.0 lb

## 2018-01-27 DIAGNOSIS — I110 Hypertensive heart disease with heart failure: Secondary | ICD-10-CM | POA: Diagnosis not present

## 2018-01-27 DIAGNOSIS — F411 Generalized anxiety disorder: Secondary | ICD-10-CM

## 2018-01-27 DIAGNOSIS — I10 Essential (primary) hypertension: Secondary | ICD-10-CM

## 2018-01-27 DIAGNOSIS — R7989 Other specified abnormal findings of blood chemistry: Secondary | ICD-10-CM | POA: Diagnosis not present

## 2018-01-27 DIAGNOSIS — I5032 Chronic diastolic (congestive) heart failure: Secondary | ICD-10-CM

## 2018-01-27 DIAGNOSIS — K219 Gastro-esophageal reflux disease without esophagitis: Secondary | ICD-10-CM | POA: Diagnosis not present

## 2018-01-27 MED ORDER — LORAZEPAM 1 MG PO TABS: 1.0000 mg | ORAL_TABLET | Freq: Two times a day (BID) | ORAL | 2 refills | Status: DC | PRN

## 2018-01-27 NOTE — Patient Instructions (Signed)
You look wonderful today! So good to see you, as always. I am glad Dr. Valentina Lucks has helped [place] establish a good blood pressure regimen. I don't think we need to adjust any of those medications.   If you are up for it, I agree with Dr. Graylin Shiver recommendation to try Ranitidine daily and only one Pantoprazole a day instead of two. Too much of the Pantoprazole can cause thinning of the bones.   Please return in 2-3 months to repeat your thyroid labs.

## 2018-01-27 NOTE — Progress Notes (Signed)
Subjective:    Rebecca Walter - 77 y.o. female MRN [id number]  Date of birth: 1941/05/03  HPI  Rebecca Walter is here for follow up of chronic medical conditions.  Chronic HTN Disease Monitoring:  Home BP Monitoring - Very concerned about her BP cuff not working. She has had it replaced under manufacter's  Chest pain- no  Dyspnea- no Headache - no  Medications: Amlodipine 5 mg, Spironolactone 12.5 mg, and Candesartan 32 mg daily  Compliance- yes Lightheadedness- no  Edema- no   Elevated TSH: TSH obtained 9/7 by cardiology and was found to be elevated 7.9. Repeat TSH elevated to 9.6 on 9/17 with normal T3/T4. Repeat TSH 11/5 was 6.8 with normal T3/T4. Weight has remained essentially unchanged. No temperature intolerance or skin/nail/hair changes.    GERD: Current regimen: Pantoprazole 40 mg BID and PRN ranitidine. Pharmacy team discussed at last visit on 1/10 trying Pantoprazole 40 mg daily and Ranitidine 150 mg daily in an attempt to minimize potential long term risks of PPIs. Ms. Huberty has not tried this yet for fear that she would have significant heartburn symptoms if she changed her regimen.   -  reports that  has never smoked. she has never used smokeless tobacco. - Review of Systems: Per HPI. - Past Medical History: Patient Active Problem List   Diagnosis Date Noted  . Elevated TSH 11/21/2017  . Hypertensive heart disease with CHF (congestive heart failure) ([place])   . Chronic diastolic CHF (congestive heart failure) ([place]) 07/15/2017  . Mild aortic stenosis 06/19/2017  . Mild mitral regurgitation 06/19/2017  . Moderate pulmonary valve insufficiency 06/19/2017  . Lumbar back pain with radiculopathy affecting right lower extremity 10/29/2016  . Allergic rhinitis 03/22/2011  . INSOMNIA 09/16/2009  . GASTROPARESIS 08/12/2009  . Anxiety 10/18/2008  . DYSKINESIA, ESOPHAGUS 09/13/2007  . CARCINOMA, BASAL CELL 05/26/2007  . Diabetes mellitus, type II ([place])  02/16/2007  . HLD (hyperlipidemia) 02/16/2007  . OBESITY, NOS 02/16/2007  . GASTROESOPHAGEAL REFLUX, NO ESOPHAGITIS 02/16/2007  . OSTEOARTHRITIS, MULTI SITES 02/16/2007  . OSTEOPENIA 02/16/2007  . Gout 02/16/2007   - Medications: reviewed and updated   Objective:   Physical Exam BP 138/70 (BP Location: Left Arm, Patient Position: Sitting, Cuff Size: Large)   Pulse 70   Temp 98.5 F (36.9 C) (Oral)   Ht 5' (1.524 m)   Wt 196 lb (88.9 kg)   LMP  (LMP Unknown)   SpO2 94%   BMI 38.28 kg/m  Gen: NAD, alert, cooperative with exam, well-appearing CV: RRR, good S1/S2, no murmur, no edema, capillary refill brisk  Resp: CTABL, no wheezes, non-labored Psych: anxious mood (patient's baseline), very talkative   Assessment & Plan:   GASTROESOPHAGEAL REFLUX, NO ESOPHAGITIS Encouraged patient to attempt trial of decreasing PPI to once daily and adding scheduled daily H2-blocker. Again reviewed potential long term risks of use of PPIs including fractures. She reports she is amenable to trying this change but is quick to let me know that if she has heartburn symptoms she will resume BID dosing of PPI.    Hypertensive heart disease with CHF (congestive heart failure) ([place]) Patient with significant anxiety around her BP. She is well controlled on current 3 drug regimen. Spent much of visit time reassuring her that her BP has looked great at recent office visits. She checks her BP at home twice per day. Discussed that she may be able to reduce this to checking a few times a week given stability of pressures at  office visits. No changes to medications made.   Elevated TSH TSH has not had a consistent uptrend. Patient does meet criteria for subclinical hypothyroidism and age is also likely contributing to increased TSH. Patient sounds asymptomatic in terms of thyroid disease. No evidence that Synthroid in treatment of asymptomatic subclinical hypothyroidism improves long term outcomes. Will have patient  return in 2-3 months for repeat TSH to monitor for progression to hypothyroidism.     Phill Myron, D.O. 01/27/2018, 4:19 PM PGY-3, [place]

## 2018-01-27 NOTE — Assessment & Plan Note (Signed)
Encouraged patient to attempt trial of decreasing PPI to once daily and adding scheduled daily H2-blocker. Again reviewed potential long term risks of use of PPIs including fractures. She reports she is amenable to trying this change but is quick to let me know that if she has heartburn symptoms she will resume BID dosing of PPI.

## 2018-01-27 NOTE — Assessment & Plan Note (Signed)
Patient with significant anxiety around her BP. She is well controlled on current 3 drug regimen. Spent much of visit time reassuring her that her BP has looked great at recent office visits. She checks her BP at home twice per day. Discussed that she may be able to reduce this to checking a few times a week given stability of pressures at office visits. No changes to medications made.

## 2018-01-27 NOTE — Assessment & Plan Note (Signed)
TSH has not had a consistent uptrend. Patient does meet criteria for subclinical hypothyroidism and age is also likely contributing to increased TSH. Patient sounds asymptomatic in terms of thyroid disease. No evidence that Synthroid in treatment of asymptomatic subclinical hypothyroidism improves long term outcomes. Will have patient return in 2-3 months for repeat TSH to monitor for progression to hypothyroidism.

## 2018-02-03 ENCOUNTER — Encounter: Payer: Self-pay | Admitting: Internal Medicine

## 2018-02-03 ENCOUNTER — Telehealth: Payer: Self-pay | Admitting: Internal Medicine

## 2018-02-03 ENCOUNTER — Ambulatory Visit: Payer: Medicare Other | Admitting: Internal Medicine

## 2018-02-03 VITALS — BP 150/70 | HR 74 | Ht 60.0 in | Wt 198.0 lb

## 2018-02-03 DIAGNOSIS — I10 Essential (primary) hypertension: Secondary | ICD-10-CM

## 2018-02-03 DIAGNOSIS — R0789 Other chest pain: Secondary | ICD-10-CM | POA: Diagnosis not present

## 2018-02-03 DIAGNOSIS — I5032 Chronic diastolic (congestive) heart failure: Secondary | ICD-10-CM | POA: Diagnosis not present

## 2018-02-03 DIAGNOSIS — E782 Mixed hyperlipidemia: Secondary | ICD-10-CM | POA: Diagnosis not present

## 2018-02-03 MED ORDER — AMLODIPINE BESYLATE 5 MG PO TABS: 5.0000 mg | ORAL_TABLET | Freq: Every day | ORAL | 3 refills | Status: DC

## 2018-02-03 MED ORDER — AMLODIPINE BESYLATE 2.5 MG PO TABS: 2.5000 mg | ORAL_TABLET | Freq: Every day | ORAL | 3 refills | Status: DC

## 2018-02-03 MED ORDER — AMLODIPINE BESYLATE 5 MG PO TABS: 7.5000 mg | ORAL_TABLET | Freq: Every day | ORAL | 11 refills | Status: DC

## 2018-02-03 NOTE — Telephone Encounter (Signed)
Called patient back.  She wanted to clarify what Dr. Harrington Challenger said in clinic.  Thought Dr. Harrington Challenger said she had irregular heart beat.  Reviewed with Dr. Harrington Challenger.  Her rhythm is fine.  Nothing to worry about.  She heard a skip while listening.  Informed patient of this.  Pt verbalizes understanding.

## 2018-02-03 NOTE — Patient Instructions (Signed)
Increase amlodipine to 5 mg + 2.5 mg daily (7.5 mg total) Will call in prescription to [facility]  Bring BP cuff to next visit  WIll call back for appointment in August, 2019

## 2018-02-03 NOTE — Telephone Encounter (Signed)
New message    Patient calling for more information on having an irregular heart rhythm. Patient has follow up questions from today's visit.

## 2018-02-03 NOTE — Progress Notes (Signed)
Cardiology Office Note   Date:  02/03/2018   ID:  Rebecca Walter, DOB December 01, 1941, MRN [id number]  PCP:  Nicolette Bang, DO  Cardiologist:   Dorris Carnes, MD   F/U of HTN     History of Present Illness: Rebecca Walter is a 77 y.o. female with a history of HTN, HL, DM, GERD, chronic distlic CHF, Seen by D Dunn on consult on 08/26/17  Pt had stress test remotely    Admitted in July with hypertensive emergency  SBP 260s  Echo with mild LVH  LVEF 60 to 65%  Stopped coreg due to SOB  Stopped clonidine   When she saw Dayna  BP was elevated  Complained of fleeting chest discomfott recomm Lexiscan myovue and f/u of BP   Myovue with small area of  mid anteroseptal and apical defect consistent with ischemia  Low risk  LVEF 67%  Since seen in clinc  she deneis CP   Breathing is OK  BP at home 120s to 160s    Current Meds  Medication Sig  . ACCU-CHEK AVIVA PLUS test strip CHECK BLOOD SUGAR FIRST  THING IN THE MORNING BEFORE EATING, AFTER LUNCH, AND  AFTER DINNER TOTAL 3 TIMES  DAILY  . allopurinol (ZYLOPRIM) 100 MG tablet TAKE 2 TABLETS BY MOUTH EVERY DAY  . alum & mag hydroxide-simeth (MAALOX/MYLANTA) 200-200-20 MG/5ML suspension Take 30 mLs by mouth every 6 (six) hours as needed for indigestion or heartburn.  Marland Kitchen amLODipine (NORVASC) 5 MG tablet Take 1 tablet (5 mg total) by mouth daily.  Marland Kitchen aspirin 81 MG chewable tablet Chew 1 tablet (81 mg total) by mouth daily.  . B-D ULTRAFINE III SHORT PEN 31G X 8 MM MISC USE AS DIRECTED  . baclofen (LIORESAL) 10 MG tablet TAKE 1 TABLET BY MOUTH 3 TIMES A DAY AS NEEDED FOR MUSCLE SPASMS  . candesartan (ATACAND) 32 MG tablet Take 1 tablet (32 mg total) by mouth at bedtime.  . insulin aspart (NOVOLOG FLEXPEN) 100 UNIT/ML FlexPen Inject 20 units into the skin three times daily before meals  . Insulin Glargine (LANTUS SOLOSTAR) 100 UNIT/ML Solostar Pen Inject 70 Units into the skin every morning.  . loratadine (CLARITIN) 10 MG tablet Take 10 mg by mouth  daily.  Marland Kitchen LORazepam (ATIVAN) 1 MG tablet Take 1 tablet (1 mg total) by mouth 2 (two) times daily as needed for anxiety.  . lovastatin (MEVACOR) 40 MG tablet Take 40 mg by mouth daily.   . metFORMIN (GLUCOPHAGE-XR) 500 MG 24 hr tablet TAKE 1 TABLET TWICE A DAY BEFORE MEALS  . metoCLOPramide (REGLAN) 5 MG tablet TAKE 1 TABLET 30 MIN BEFORE MEALS AND 1 TABLET AT BEDTIME. FOUR TIMES/DAY  . Multiple Vitamin (MULTIVITAMIN) tablet Take 1 tablet by mouth daily.    Marland Kitchen NOVOLOG FLEXPEN 100 UNIT/ML FlexPen INJECT 35 UNITS INTO THE SKIN 2 TIMES DAILY BEFORE BREAKFAST AND DINNER  . pantoprazole (PROTONIX) 40 MG tablet Take 40 mg by mouth 2 (two) times daily.   . Probiotic Product (PROBIOTIC PEARLS) CAPS Take 1 capsule by mouth once.   . ranitidine (ZANTAC) 150 MG tablet Take 150 mg daily by mouth.   . senna (SENOKOT) 8.6 MG TABS tablet Take 1 tablet by mouth at bedtime as needed for mild constipation.  . silver sulfADIAZINE (SILVADENE) 1 % cream Apply 1 application topically daily.  Marland Kitchen spironolactone (ALDACTONE) 25 MG tablet Take 0.5 tablets (12.5 mg total) by mouth daily.  Marland Kitchen torsemide (DEMADEX) 20 MG tablet  Take 1 tablet (20 mg total) by mouth daily.     Allergies:   Hctz [hydrochlorothiazide]; Lotensin [benazepril]; Victoza [liraglutide]; Acyclovir and related; Beta adrenergic blockers; and Losartan   Past Medical History:  Diagnosis Date  . Anxiety   . Arthritis   . Chronic diastolic CHF (congestive heart failure) ([place])   . Diabetes mellitus   . GERD (gastroesophageal reflux disease)   . Gout   . Hyperlipidemia   . Hypertension   . Hypertensive heart disease with CHF (congestive heart failure) ([place])   . Mild aortic stenosis 06/2017  . Mild mitral regurgitation 06/2017  . Moderate pulmonary valve insufficiency 06/2017  . Osteopenia     Past Surgical History:  Procedure Laterality Date  . KIDNEY SURGERY    . KNEE SURGERY    . MENISECTOMY    . WRIST SURGERY       Social History:  The  patient  reports that  has never smoked. she has never used smokeless tobacco. She reports that she does not drink alcohol or use drugs.   Family History:  The patient's family history includes Cancer in her brother and sister; Diabetes in her maternal uncle; Heart disease in her brother and father; Hypertension in her father; Stroke in her father and mother.    ROS:  Please see the history of present illness. All other systems are reviewed and  Negative to the above problem except as noted.    PHYSICAL EXAM: VS:  BP (!) 150/70 (BP Location: Left Arm, Patient Position: Sitting, Cuff Size: Large)   Pulse 74   Ht 5' (1.524 m)   Wt 198 lb (89.8 kg)   LMP  (LMP Unknown)   SpO2 93%   BMI 38.67 kg/m   BP on my check 154/65 GEN: Morbidly obese 77 yo , in no acute distress  HEENT: normal  Neck: JVP normal , carotid bruits, or masses Cardiac: RRR; no murmurs, rubs, or gallops,Tr edema (wearing support socks)   Respiratory:  clear to auscultation bilaterally, normal work of breathing  Minimal rhonchi   GI: soft, nontender, nondistended, + BS  No hepatomegaly  MS: no deformity Moving all extremities   Skin: warm and dry, no rash Neuro:  Strength and sensation are intact Psych: euthymic mood, full affect   EKG:  EKG is not ordered today.   Lipid Panel    Component Value Date/Time   CHOL 171 07/16/2017 0402   TRIG 249 (H) 07/16/2017 0402   HDL 41 07/16/2017 0402   CHOLHDL 4.2 07/16/2017 0402   VLDL 50 (H) 07/16/2017 0402   LDLCALC 80 07/16/2017 0402   LDLDIRECT 103 (H) 03/11/2010 2127      Wt Readings from Last 3 Encounters:  02/03/18 198 lb (89.8 kg)  01/27/18 196 lb (88.9 kg)  01/15/18 192 lb (87.1 kg)      ASSESSMENT AND PLAN:  1  Chest tightnes   Pt is asymptomatic  2  HTN  BP is up   I would increase amlodipine to 7.5 mg daily  Follow up next summer  Bring cuff to next visit  3  HL  Keep on lovastatin    4  Diastolic CHF  Volume status is OK  Keep on same meds   Labs were OK     Current medicines are reviewed at length with the patient today.  The patient does not have concerns regarding medicines.  Signed, Dorris Carnes, MD  02/03/2018 12:40 PM    [facility] [address], [place], [place]  [address] Phone: [phone]; Fax: [phone]

## 2018-02-06 ENCOUNTER — Ambulatory Visit: Payer: Medicare Other | Admitting: Internal Medicine

## 2018-02-06 ENCOUNTER — Other Ambulatory Visit: Payer: Self-pay

## 2018-02-06 ENCOUNTER — Encounter: Payer: Self-pay | Admitting: Internal Medicine

## 2018-02-06 VITALS — BP 156/66 | HR 81 | Temp 98.2°F | Ht 60.0 in | Wt 198.6 lb

## 2018-02-06 DIAGNOSIS — B9789 Other viral agents as the cause of diseases classified elsewhere: Secondary | ICD-10-CM | POA: Diagnosis not present

## 2018-02-06 DIAGNOSIS — J069 Acute upper respiratory infection, unspecified: Secondary | ICD-10-CM

## 2018-02-06 NOTE — Patient Instructions (Signed)
You have a viral upper respiratory tract infection.   Timeline for the common cold: Symptoms typically peak at 2-3 days of illness and then gradually improve over 10-14 days. However, a cough may last 2-4 weeks.   Please drink plenty of fluids. Eating warm liquids such as chicken soup or tea may also help with nasal congestion.  Continue with the nasal saline spray and Claritin.    Please call the doctor if:   Having difficulty breathing, working hard to breathe, or breathing rapidly  Has fever greater than 101F (38.4C) for more than three days  Nasal congestion that does not improve or worsens over the course of 14 days  The eyes become red or develop yellow discharge  Cough lasts more than 3 weeks

## 2018-02-06 NOTE — Progress Notes (Signed)
   Subjective:    Rebecca Walter - 76 y.o. female MRN [id number]  Date of birth: 1941/11/16  HPI  Rebecca Walter is here for congestion. Reports has had nasal congestion for 2 days. Also endorsing dry cough. No fevers, respiratory distress, headaches, sore throat, or ear pain. She has been using Claritin and nasal saline spray with good improvement in symptoms. She denies sick contacts but is very anxious that she could have picked something up at the grocery store.   -  reports that  has never smoked. she has never used smokeless tobacco. - Review of Systems: Per HPI. - Past Medical History: Patient Active Problem List   Diagnosis Date Noted  . Elevated TSH 11/21/2017  . Hypertensive heart disease with CHF (congestive heart failure) ([place])   . Chronic diastolic CHF (congestive heart failure) ([place]) 07/15/2017  . Mild aortic stenosis 06/19/2017  . Mild mitral regurgitation 06/19/2017  . Moderate pulmonary valve insufficiency 06/19/2017  . Lumbar back pain with radiculopathy affecting right lower extremity 10/29/2016  . Allergic rhinitis 03/22/2011  . INSOMNIA 09/16/2009  . GASTROPARESIS 08/12/2009  . Anxiety 10/18/2008  . DYSKINESIA, ESOPHAGUS 09/13/2007  . CARCINOMA, BASAL CELL 05/26/2007  . Diabetes mellitus, type II ([place]) 02/16/2007  . HLD (hyperlipidemia) 02/16/2007  . OBESITY, NOS 02/16/2007  . GASTROESOPHAGEAL REFLUX, NO ESOPHAGITIS 02/16/2007  . OSTEOARTHRITIS, MULTI SITES 02/16/2007  . OSTEOPENIA 02/16/2007  . Gout 02/16/2007   - Medications: reviewed and updated   Objective:   Physical Exam BP (!) 156/66   Pulse 81   Temp 98.2 F (36.8 C) (Oral)   Ht 5' (1.524 m)   Wt 198 lb 9.6 oz (90.1 kg)   LMP  (LMP Unknown)   SpO2 95%   BMI 38.79 kg/m  Gen: NAD, alert, cooperative with exam, well-appearing HEENT: NCAT, PERRL, clear conjunctiva, oropharynx clear, supple neck, TMs normal bilaterally, nasal turbinates mildly edematous  CV: RRR, good S1/S2, no murmur, no  edema, capillary refill brisk  Resp: CTABL, no wheezes, non-labored, good air movement throughout  Psych: good insight, alert and oriented, anxious (baseline)   Assessment & Plan:   1. Viral URI with cough Symptoms and exam consistent with viral URI. No evidence of CAP on lung exam and reassuring that patient afebrile and without hypoxia. Continue use of nasal saline and anti-histamine. Recommended addition of Mucinex. Return precautions and timeline of common cold discussed.   Phill Myron, D.O. 02/06/2018, 2:31 PM PGY-3, [place]

## 2018-02-08 ENCOUNTER — Other Ambulatory Visit: Payer: Self-pay | Admitting: Family Medicine

## 2018-02-08 DIAGNOSIS — M25551 Pain in right hip: Secondary | ICD-10-CM

## 2018-02-09 ENCOUNTER — Telehealth: Payer: Self-pay | Admitting: Internal Medicine

## 2018-02-09 NOTE — Telephone Encounter (Signed)
Pt would like to talk to Dr Valentina Lucks. She is having some blood pressure problems.  Her BP is going up after she eats.

## 2018-02-09 NOTE — Telephone Encounter (Signed)
Please call her on her cell phone.  She is requesting he call her back very soon.

## 2018-02-09 NOTE — Telephone Encounter (Signed)
Called and patient reported recent elevated BPs to > 543 systolic.  She states she has been increased on amlodipine to 10mg  daily.   she is concerned that her blood pressure has been elevated since she has been sick and has complaints of sinus congestion.  Following discussion, we agreed that if she continued to worry about her blood pressure being elevated. I would see her on 2/22 in the late AM.  She accepted this plan.

## 2018-02-10 ENCOUNTER — Ambulatory Visit: Payer: Medicare Other | Admitting: Pharmacist

## 2018-02-10 ENCOUNTER — Encounter: Payer: Self-pay | Admitting: Pharmacist

## 2018-02-10 DIAGNOSIS — I5032 Chronic diastolic (congestive) heart failure: Secondary | ICD-10-CM | POA: Diagnosis not present

## 2018-02-10 DIAGNOSIS — I10 Essential (primary) hypertension: Secondary | ICD-10-CM

## 2018-02-10 DIAGNOSIS — I110 Hypertensive heart disease with heart failure: Secondary | ICD-10-CM | POA: Diagnosis not present

## 2018-02-10 MED ORDER — SPIRONOLACTONE 25 MG PO TABS: 25.0000 mg | ORAL_TABLET | Freq: Every day | ORAL | Status: DC

## 2018-02-10 NOTE — Patient Instructions (Signed)
Great to see you today!  I hope your back pain and your sinus congestion pain improves over the next few days.   Please increase your spironolactone to 25mg  once daily (full tablet).  We will get labs today and next week as well.   Follow up in 1 week - Rx clinic.

## 2018-02-10 NOTE — Assessment & Plan Note (Signed)
Hypertension longstanding currently above target on current medications:  Amlodipine 7.5mg  daily, Cadesartan 32mg , spironolactone 12.5mg  daily and Torsemide 20mg .  Her weight is reduced a few pounds from previous. Back pain or URI pain are possible reasons for elevated BP recently.  Continues to have wide-pulse pressure.  Denies dizziness with position change.   Increased dose of spironolactone from 12.5mg  daily to 25mg  daily. Continued all other medications at same doses at this time.  Obtained BMET today.

## 2018-02-10 NOTE — Progress Notes (Addendum)
   S:    Patient arrives in good spirits, ambulating with assistance of cane.    Presents to the clinic for hypertension evaluation following phone call yesterday with complaints of home readings remaining above goal. She notes recent increase in amlodipine from 5mg  to 7.5mg  by Dr. Harrington Challenger.  Patient was last seen by Primary Care Provider on 2/18 for URI which is slowly resolving.  She notes continued sinus congestion.   She also admits her back pain has been more problematic recently.    Patient reports adherence with medications.  Current BP Medications include:  Amlodipine 7.5mg  daily, Cadesartan 32mg , spironolactone 12.5mg  daily and Torsemide 20mg   Antihypertensives tried in the past include: losartan (intolerance), lisinopril, and HCTZ (gout).   Dietary habits include: limiting carb intake.    O:   Last 3 Office BP readings: BP Readings from Last 3 Encounters:  02/06/18 (!) 156/66  02/03/18 (!) 150/70  01/27/18 138/70    Home BP readings from OMRON meter:  165/74  169/72 151/74 167/76  Edema is Trace-1+ bilaterally in lower legs.    BMET    Component Value Date/Time   NA 141 12/05/2017 1451   K 4.1 12/05/2017 1451   CL 99 12/05/2017 1451   CO2 25 12/05/2017 1451   GLUCOSE 162 (H) 12/05/2017 1451   GLUCOSE 240 (H) 07/15/2017 2000   BUN 25 12/05/2017 1451   CREATININE 0.97 12/05/2017 1451   CREATININE 0.93 09/16/2016 1410   CALCIUM 10.1 12/05/2017 1451   GFRNONAA 56 (L) 12/05/2017 1451   GFRNONAA 70 02/06/2015 1418   GFRAA 65 12/05/2017 1451   GFRAA 81 02/06/2015 1418    A/P: Hypertension longstanding currently above target on current medications:  Amlodipine 7.5mg  daily, Cadesartan 32mg , spironolactone 12.5mg  daily and Torsemide 20mg .  Her weight is reduced a few pounds from previous. Back pain or URI pain are possible reasons for elevated BP recently.  Continues to have wide-pulse pressure.  Denies dizziness with position change.   Increased dose of  spironolactone from 12.5mg  daily to 25mg  daily. Continued all other medications at same doses at this time.  Obtained BMET today.    Results reviewed and written information provided.   Total time in face-to-face counseling 20 minutes.    F/U Clinic Visit with Rx Clinic here in [facility] 1 week - scheduled as an add-on.   BMET -normal   Follow-up within the week for reevaluation.

## 2018-02-11 ENCOUNTER — Telehealth: Payer: Self-pay | Admitting: Internal Medicine

## 2018-02-11 LAB — BASIC METABOLIC PANEL
BUN / CREAT RATIO: 21 (ref 12–28)
BUN: 19 mg/dL (ref 8–27)
CO2: 22 mmol/L (ref 20–29)
CREATININE: 0.9 mg/dL (ref 0.57–1.00)
Calcium: 9.7 mg/dL (ref 8.7–10.3)
Chloride: 100 mmol/L (ref 96–106)
GFR calc Af Amer: 71 mL/min/{1.73_m2} (ref >59)
GFR, EST NON AFRICAN AMERICAN: 61 mL/min/{1.73_m2} (ref >59)
Glucose: 236 mg/dL — ABNORMAL HIGH (ref 65–99)
Potassium: 4 mmol/L (ref 3.5–5.2)
Sodium: 140 mmol/L (ref 134–144)

## 2018-02-11 NOTE — Telephone Encounter (Signed)
**  After Hours/ Emergency Line Call*  Received a call to report that Rebecca Walter: - Reports that she was seen in clinic yesterday for BP management. Her spironolactone was increased from 12.5mg  to 25mg .  She took the first increased dose today. - she checked her BP 143/73 and cbg normal - earlier today at about 12:30 to 1pm: got up to come in the kitchen. Felt like she was "about the tip over" and she had to hold on to the wall. She was feeling off balance and reports her head was feeling weird. Both of her legs felt weak. Her symptoms have somewhat improved but still not back at baseline.  - called the nurse line who told her to call the [facility] emergency line.  - she was wonders if the spironolactone increased her Potassium. After one dose of spiro I told her that it was not likely  - she denies chest pain, SOB, focal weakness/numbness. Blurred vision.   Recommended that she go to the ED or urgent care to be evaluated.  She declined and stated she will not go now. I discussed the risks with her and she understood. She said that if she feels this way after eating dinner, she will go. I still recommended going to the ED now, but patient declined.   Will forward to PCP.  Smiley Houseman, MD PGY-3, [facility]

## 2018-02-14 ENCOUNTER — Other Ambulatory Visit: Payer: Self-pay | Admitting: Internal Medicine

## 2018-02-15 ENCOUNTER — Other Ambulatory Visit: Payer: Self-pay | Admitting: Internal Medicine

## 2018-02-16 ENCOUNTER — Ambulatory Visit: Payer: Medicare Other | Admitting: Pharmacist

## 2018-02-16 ENCOUNTER — Encounter: Payer: Self-pay | Admitting: Pharmacist

## 2018-02-16 VITALS — BP 134/60 | HR 74 | Wt 196.0 lb

## 2018-02-16 DIAGNOSIS — I5032 Chronic diastolic (congestive) heart failure: Secondary | ICD-10-CM

## 2018-02-16 DIAGNOSIS — G4733 Obstructive sleep apnea (adult) (pediatric): Secondary | ICD-10-CM | POA: Diagnosis not present

## 2018-02-16 DIAGNOSIS — I110 Hypertensive heart disease with heart failure: Secondary | ICD-10-CM | POA: Diagnosis not present

## 2018-02-16 NOTE — Progress Notes (Signed)
Patient ID: Rebecca Walter, female   DOB: [birth year], 77 y.o.   MRN: [id number] Reviewed: Agree with Dr. Graylin Shiver documentation and management.

## 2018-02-16 NOTE — Patient Instructions (Addendum)
Thanks for coming to see Rebecca Walter! It is always good to see you.   Your blood pressures from home look better. Labs today.  Do not take torsemide today. Continue taking the spironolactone 1 whole pill daily. We will call you with your lab results tomorrow and we will come up with a plan for the medications.   Continue weighing yourself every day at the same time.

## 2018-02-16 NOTE — Progress Notes (Addendum)
   S:    Patient arrives in good spirits ambulating with walker.    Longstanding patient of Rx clinic presents for hypertension evaluation. Patient was last seen by Primary Care Provider Dr. Juleen China on 02/06/18. Patient reports surprise at in clinic weight of 196 lbs. States weight on home scale is is 181 lbs. Endorses improvement in leg edema, but complains of continued gut edema. States diuresis made her feel better and denies any dizziness. Patient believes she is very near dry weight. Home systolic blood pressures 353-299 / 65-75per patient monitor.    Patient reports adherence with medications.  Current BP Medications include:  Amlodipine 7.5mg  daily, candesartan 32mg  QHS, spironolactone 25 mg daily, toresemide 20 mg daily   Antihypertensives tried in the past include: HCTZ, benazepril, metoprolol, losartan  O:   Last 3 Office BP readings: BP Readings from Last 3 Encounters:  02/10/18 (!) 162/66  02/06/18 (!) 156/66  02/03/18 (!) 150/70    BMET    Component Value Date/Time   NA 140 02/10/2018 1154   K 4.0 02/10/2018 1154   CL 100 02/10/2018 1154   CO2 22 02/10/2018 1154   GLUCOSE 236 (H) 02/10/2018 1154   GLUCOSE 240 (H) 07/15/2017 2000   BUN 19 02/10/2018 1154   CREATININE 0.90 02/10/2018 1154   CREATININE 0.93 09/16/2016 1410   CALCIUM 9.7 02/10/2018 1154   GFRNONAA 61 02/10/2018 1154   GFRNONAA 70 02/06/2015 1418   GFRAA 71 02/10/2018 1154   GFRAA 81 02/06/2015 1418    PE:  Bilateral lower extremity exam:  No edema, skin normal level of hydration.   ROS:  Denies dry mouth.   A/P: Hypertension longstanding much improved per in office bp reading of 134/60 and since starting spironolactone 25 mg at previous visit. Fluid retention much improved per patient. Instructed patient to skip today's dose of torsemide until CMET returns for evaluation of electrolyte status and kidney function. Follow up phone call once lab results return.  Results reviewed and written  information provided. Total time in face-to-face counseling 20 minutes.   F/U Clinic Visit with Dr. Juleen China in near future.  Patient seen with Onnie Boer, PharmD Candidate, Leroy Libman, PharmD, and Deirdre Pippins, PGY2 Pharmacy Resident, PharmD, BCPS.   Potassium 4.9

## 2018-02-16 NOTE — Assessment & Plan Note (Signed)
Hypertension longstanding much improved per in office bp reading of 134/60 and since starting spironolactone 25 mg at previous visit. Fluid retention much improved per patient. Instructed patient to skip today's dose of torsemide until CMET returns for evaluation of electrolyte status and kidney function. Follow up phone call once lab results return.

## 2018-02-17 ENCOUNTER — Telehealth: Payer: Self-pay | Admitting: Pharmacist

## 2018-02-17 LAB — BASIC METABOLIC PANEL
BUN/Creatinine Ratio: 28 (ref 12–28)
BUN: 25 mg/dL (ref 8–27)
CO2: 25 mmol/L (ref 20–29)
CREATININE: 0.89 mg/dL (ref 0.57–1.00)
Calcium: 10.2 mg/dL (ref 8.7–10.3)
Chloride: 99 mmol/L (ref 96–106)
GFR calc Af Amer: 72 mL/min/{1.73_m2} (ref >59)
GFR calc non Af Amer: 62 mL/min/{1.73_m2} (ref >59)
GLUCOSE: 117 mg/dL — AB (ref 65–99)
Potassium: 4.9 mmol/L (ref 3.5–5.2)
Sodium: 138 mmol/L (ref 134–144)

## 2018-02-17 NOTE — Telephone Encounter (Signed)
-----   Message from Zenia Resides, MD sent at 02/17/2018  9:01 AM EST -----   ----- Message ----- From: Lavone Neri Lab Results In Sent: 02/17/2018   8:24 AM To: Zenia Resides, MD

## 2018-02-17 NOTE — Telephone Encounter (Signed)
Shared lab value potassium in range at 4.9 Home weight reported 185#.Advised to change torsemide to 20mg  five days per week.  Asked to return in 1 week to reassess BP, weight and edema.  Reassess control of BP, electrolytes and weight in 1 week.

## 2018-02-21 ENCOUNTER — Other Ambulatory Visit: Payer: Self-pay | Admitting: Internal Medicine

## 2018-02-22 DIAGNOSIS — G4733 Obstructive sleep apnea (adult) (pediatric): Secondary | ICD-10-CM | POA: Diagnosis not present

## 2018-02-23 ENCOUNTER — Ambulatory Visit: Payer: Medicare Other | Admitting: Pharmacist

## 2018-02-23 ENCOUNTER — Encounter: Payer: Self-pay | Admitting: Pharmacist

## 2018-02-23 DIAGNOSIS — I10 Essential (primary) hypertension: Secondary | ICD-10-CM

## 2018-02-23 DIAGNOSIS — I5032 Chronic diastolic (congestive) heart failure: Secondary | ICD-10-CM

## 2018-02-23 DIAGNOSIS — I110 Hypertensive heart disease with heart failure: Secondary | ICD-10-CM

## 2018-02-23 MED ORDER — TORSEMIDE 20 MG PO TABS: 20.0000 mg | ORAL_TABLET | ORAL | 5 refills | Status: DC

## 2018-02-23 MED ORDER — CARVEDILOL 3.125 MG PO TABS: 3.1250 mg | ORAL_TABLET | Freq: Two times a day (BID) | ORAL | 3 refills | Status: DC

## 2018-02-23 NOTE — Progress Notes (Addendum)
   S:    Patient arrives in good spirits, ambulating with a cane only.  Long-standing patient of pharmacy clinic presents for hypertension evaluation.  Patient was last seen by Primary Care Provider on 02/06/18.  Patient endorses that since increasing her spironolactone she doesn't feel "good." She reports being dizzy sometimes. States the highest her home systolic blood pressure gets is around the 160s. She does endorse that the fluid in her lower extremities has decreased.   Patient reports adherence with medications.  Current BP Medications include:  Amlodipine 7.5mg  (increased to 10mg  yesterday) daily, candesartan 32mg  QHS, spironolactone 25 mg daily, toresemide 20 mg every other day  Antihypertensives tried in the past include: HCTZ, benazepril, metoprolol, losartan  Dietary habits include: She endorses not eating much canned food, except for chicken. Most vegetables are frozen. Typical breakfast is grits, 1 slice of bacon, yogurt, eggs. Dinner last night was canned beans, steamer vegetables, and a roll. Typical lunch is a sandwich, some chips and a pudding, sometimes half a graham cracker.    O:   Last 3 Office BP readings: BP Readings from Last 3 Encounters:  02/16/18 134/60  02/10/18 (!) 162/66  02/06/18 (!) 156/66    BMET    Component Value Date/Time   NA 138 02/16/2018 1131   K 4.9 02/16/2018 1131   CL 99 02/16/2018 1131   CO2 25 02/16/2018 1131   GLUCOSE 117 (H) 02/16/2018 1131   GLUCOSE 240 (H) 07/15/2017 2000   BUN 25 02/16/2018 1131   CREATININE 0.89 02/16/2018 1131   CREATININE 0.93 09/16/2016 1410   CALCIUM 10.2 02/16/2018 1131   GFRNONAA 62 02/16/2018 1131   GFRNONAA 70 02/06/2015 1418   GFRAA 72 02/16/2018 1131   GFRAA 81 02/06/2015 1418    A/P: Hypertension longstanding currently uncontrolled on current medications, but improving.  Continued Amlodipine 7.5-10mg  daily, candesartan 32mg  QHS, spironolactone 25 mg daily, toresemide 20 mg every other day.   Added new therapy today for further BP control, carvedilol 3.125mg  PO BID. Counseled patient on side effects, patient has also previously taken this medication. Fluid retention has continued to improve for patient since previous visit, but concerned about potassium levels. Check BMET toda for electrolyte status and kidney function Follow-up phone call with results tomorrow may make further adjustments of medications tomorrow based on lab results.  Results reviewed and written information provided.   Total time in face-to-face counseling 30 minutes.   Patient seen with Hildred Alamin, PharmD Candidate and Jalene Mullet, PharmD, PGY1 Resident and Deirdre Pippins, PGY2 Pharmacy Resident, PharmD, BCPS.    Scr Stable and Postassium WNL on repeat BMET.   No change in therapy.

## 2018-02-23 NOTE — Patient Instructions (Addendum)
It was nice to see you today.   1. Continue taking your torsemide 20mg  every other day.  2. I will call you with lab work tomorrow and discuss the plan for your spironolactone or candesartan.  3. I am prescribing a new medication today, carvedilol 3.125mg  twice a day.This was sent to your pharmacy.  4. Continue to watch your sodium intake. When you go out to eat and canned foods have a lot of salt.

## 2018-02-23 NOTE — Assessment & Plan Note (Signed)
Hypertension longstanding currently uncontrolled on current medications, but improving.  Continued Amlodipine 7.5-10mg  daily, candesartan 32mg  QHS, spironolactone 25 mg daily, toresemide 20 mg every other day.  Added new therapy today for further BP control, carvedilol 3.125mg  PO BID. Counseled patient on side effects, patient has also previously taken this medication. Fluid retention has continued to improve for patient since previous visit, but concerned about potassium levels. Check BMET toda for electrolyte status and kidney function Follow-up phone call with results tomorrow may make further adjustments of medications tomorrow based on lab results.

## 2018-02-24 ENCOUNTER — Telehealth: Payer: Self-pay | Admitting: Pharmacist

## 2018-02-24 LAB — BASIC METABOLIC PANEL
BUN/Creatinine Ratio: 23 (ref 12–28)
BUN: 22 mg/dL (ref 8–27)
CALCIUM: 10.4 mg/dL — AB (ref 8.7–10.3)
CO2: 25 mmol/L (ref 20–29)
CREATININE: 0.97 mg/dL (ref 0.57–1.00)
Chloride: 102 mmol/L (ref 96–106)
GFR calc non Af Amer: 56 mL/min/{1.73_m2} — ABNORMAL LOW (ref >59)
GFR, EST AFRICAN AMERICAN: 65 mL/min/{1.73_m2} (ref >59)
GLUCOSE: 117 mg/dL — AB (ref 65–99)
POTASSIUM: 5 mmol/L (ref 3.5–5.2)
Sodium: 143 mmol/L (ref 134–144)

## 2018-02-24 NOTE — Telephone Encounter (Signed)
Contacted Rebecca Walter today to discuss the results from her BMET drawn yesterday. Her potassium is 5.0 and Scr is 0.97, just up slightly from previous BMET last month.   Discussed plan with Rebecca Walter to continue her torsemide every other day, to continue spironolactone at the 25mg  dose, and other HTN medications as we discussed yesterday. Rebecca Walter did pick up the carvedilol from the pharmacy yesterday. She has not yet taken a dose of the carvedilol and so I could not assess for side effects. She endorsed not feeling well this morning due to an upset stomach, but would take a dose of the carvedilol later today if feeling better. She verbalized understanding of lab results reviewed and medications discussed.  Would like to see her back in clinic in 2 weeks to reassess her BMET and tolerance to her new HTN regimen. Rebecca Walter stated she would like to be seen sooner due to concerns of a newly started medication and will call the office to make an appointment with the pharmacy clinic on 03/02/2018.   Rebecca Walter, Pharm.D. PGY1 Pharmacy Resident 02/24/2018 11:46 AM

## 2018-02-27 ENCOUNTER — Telehealth: Payer: Self-pay | Admitting: Internal Medicine

## 2018-02-27 NOTE — Telephone Encounter (Signed)
Please work this patient in on Wed PM at 1:30

## 2018-02-27 NOTE — Telephone Encounter (Signed)
Feeling weak and dizzy over the weekend.   OK when she wakes around the house in the morning.   BP was 155 / 60 this AM - weight was consistent at 190lb this AM.  Took her torsemide this AM as well.  Asked her to continue weighing daily and hold torsemide as long as pressure is low (145/60) and weight is < 192#  Patient plans to follow-up in Rx clinic on Thursday.

## 2018-02-27 NOTE — Telephone Encounter (Signed)
Pt is not feeling good--weakness, dizzy.  She started taking carvedilol on Friday. Could this be side effects of the medicine? Please advise .  SHe needs to hear from somebody this morning.   Dr Valentina Lucks prescribed the medication.

## 2018-02-27 NOTE — Telephone Encounter (Signed)
Pt wants her appt at 1:30 on Thursday 03-02-18. She cant come in at 9:00. She would like to be worked in. Please call and let her know if this can be done.

## 2018-03-02 ENCOUNTER — Ambulatory Visit: Payer: Medicare Other | Admitting: Pharmacist

## 2018-03-02 ENCOUNTER — Encounter: Payer: Self-pay | Admitting: Pharmacist

## 2018-03-02 DIAGNOSIS — I5032 Chronic diastolic (congestive) heart failure: Secondary | ICD-10-CM | POA: Diagnosis not present

## 2018-03-02 DIAGNOSIS — I110 Hypertensive heart disease with heart failure: Secondary | ICD-10-CM | POA: Diagnosis not present

## 2018-03-02 NOTE — Patient Instructions (Addendum)
No change today. Great job working hard on measuring your blood pressure at home! Also great job remembering to take your high blood pressure medications!  Dr. Valentina Lucks will follow up with you and contact you if your lab electrolytes or kidney function are abnormal. Please continue to take your medications including the carvedilol 3.125 mg twice daily.   Return to follow up with Dr. Valentina Lucks in 2 weeks.

## 2018-03-02 NOTE — Progress Notes (Signed)
   S:    Patient arrives ambulating with a cane and in good spirits.    Presents to the clinic for hypertension evaluation. Patient was referred on 02/06/18.  Patient was last seen by Primary Care Provider on 02/06/2018. At last pharmacy clinic visits, amlodipine was continued at 7.5mg -10 mg daily (pt self-increased), carvedilol 3.125 mg BID was started.   Today, patient reports "I feel much better", brings in home BP cuff (automaitc, bicep) for review. Patient reports adherence with medications.  Current BP Medications include:  carvedilol 3.125 mg twice daily, amlodipine patient taking 10mg  daily, candesartan 32mg  QHS, spironolactone 25 mg daily, toresemide 20 mg every other day  Antihypertensives tried in the past include:  HCTZ, benazepril, metoprolol, losartan  Dietary habits include: ate a [place] sandwich for lunch.   O:  Last 3 Office BP readings: BP Readings from Last 3 Encounters:  03/02/18 (!) 136/58  02/23/18 (!) 152/60  02/16/18 134/60    BMET    Component Value Date/Time   NA 143 02/23/2018 1437   K 5.0 02/23/2018 1437   CL 102 02/23/2018 1437   CO2 25 02/23/2018 1437   GLUCOSE 117 (H) 02/23/2018 1437   GLUCOSE 240 (H) 07/15/2017 2000   BUN 22 02/23/2018 1437   CREATININE 0.97 02/23/2018 1437   CREATININE 0.93 09/16/2016 1410   CALCIUM 10.4 (H) 02/23/2018 1437   GFRNONAA 56 (L) 02/23/2018 1437   GFRNONAA 70 02/06/2015 1418   GFRAA 65 02/23/2018 1437   GFRAA 81 02/06/2015 1418    Home BP readings: 130-140s/70-80s mmHg, SBP excursions to 150s mmHg Home HR readings: 60s BPM  A/P: Hypertension longstanding controlled on current medications. Pt tolerating medications well.  Continued carvedilol 3.125 mg twice daily, candesartan 32 mg daily, amldoipine 10 mg daily, spironolactone 25 mg daily, torsemide 20 mg PRN once daily  for swelling. Discussed "dry weight of 190#" and how to manage torsemide. Suggested taking torsemide if weight was 194# or higher.  BMET today.  Will follow up with patient PRN.   Results reviewed and written information provided. Follow up in pharmacy clinic in 2 weeks.  Total time in face-to-face counseling 30 minutes.    F/U Clinic Visit with Dr. Valentina Lucks in 2 weeks.   Patient seen with Hildred Alamin, PharmD Candidate and Jalene Mullet, PharmD, PGY1 Resident and Deirdre Pippins, PGY2 Pharmacy Resident, PharmD, BCPS.

## 2018-03-02 NOTE — Assessment & Plan Note (Signed)
Hypertension longstanding controlled on current medications. Pt tolerating medications well.  Continued carvedilol 3.125 mg twice daily, candesartan 32 mg daily, amldoipine 10 mg daily, spironolactone 25 mg daily, torsemide 20 mg PRN once daily  for swelling. Discussed "dry weight of 190#" and how to manage torsemide. Suggested taking torsemide if weight was 194# or higher.  BMET today. Will follow up with patient.

## 2018-03-03 LAB — BASIC METABOLIC PANEL
BUN / CREAT RATIO: 30 — AB (ref 12–28)
BUN: 29 mg/dL — ABNORMAL HIGH (ref 8–27)
CO2: 25 mmol/L (ref 20–29)
Calcium: 10 mg/dL (ref 8.7–10.3)
Chloride: 102 mmol/L (ref 96–106)
Creatinine, Ser: 0.97 mg/dL (ref 0.57–1.00)
GFR, EST AFRICAN AMERICAN: 65 mL/min/{1.73_m2} (ref >59)
GFR, EST NON AFRICAN AMERICAN: 56 mL/min/{1.73_m2} — AB (ref >59)
Glucose: 132 mg/dL — ABNORMAL HIGH (ref 65–99)
Potassium: 4.9 mmol/L (ref 3.5–5.2)
SODIUM: 140 mmol/L (ref 134–144)

## 2018-03-06 ENCOUNTER — Telehealth: Payer: Self-pay | Admitting: Pharmacist

## 2018-03-06 NOTE — Telephone Encounter (Signed)
Follow-up  CBG/HTN and lab work.  Patient states she is doing well.  CBG in the 100-150  range all weekend.  States weight is stable at #190-191 and has taken torsemide yesterday (not daily) at this time.  HR was 55-65 on carvedilol 3.125mg  BID.  She denied an increase in dizziness.  Shared that lab work was unchanged and in acceptable range.  Encouraged her to continue being active use her torsemide only if her weight increased.  She verbalized understanding of PRN torsemide and continued daily spironolactone.   Follow anticipated in 10 days.

## 2018-03-08 ENCOUNTER — Telehealth: Payer: Self-pay | Admitting: Internal Medicine

## 2018-03-08 NOTE — Telephone Encounter (Signed)
Reports her Heart Rate has been running ~ 57 on multiple readings.   Blood Pressure is 140s / 59-70.  Denies dizziness or excessive sedation or tiredeness.  States she feels rested and she is also doing well with CPAP.   No change in therapy suggest.  She is agreeable to continuing current therapy.   Plan to follow-up in Rx clinic next week.

## 2018-03-08 NOTE — Telephone Encounter (Signed)
Pt would like to speak with [place]. Pt said she was told to call if her pulse when below 60 and it was below 60 when she checked it last. Please call her to discuss this.

## 2018-03-08 NOTE — Telephone Encounter (Signed)
Please call pt at [phone].

## 2018-03-10 ENCOUNTER — Other Ambulatory Visit: Payer: Self-pay

## 2018-03-10 NOTE — Patient Outreach (Signed)
[facility]) Care Management  03/10/2018  [name] [birth year] [id number]   Medication Adherence call to Mrs. Rebecca Walter patient is showing past due under [place] health Care Ins.on Candesartan 32 mg  Spoke with patient she said she is only taking it as needed because she is taking two other blood pressure medication Mrs. [place] said she has an appointment with Dr. Juleen China and will go over it with her doctor at the time.  [place] Management Direct Dial [phone]  Fax [phone] [email or web address]

## 2018-03-16 ENCOUNTER — Ambulatory Visit: Payer: Medicare Other | Admitting: Pharmacist

## 2018-03-16 ENCOUNTER — Encounter: Payer: Self-pay | Admitting: Pharmacist

## 2018-03-16 DIAGNOSIS — I5032 Chronic diastolic (congestive) heart failure: Secondary | ICD-10-CM | POA: Diagnosis not present

## 2018-03-16 DIAGNOSIS — Z780 Asymptomatic menopausal state: Secondary | ICD-10-CM

## 2018-03-16 DIAGNOSIS — M810 Age-related osteoporosis without current pathological fracture: Secondary | ICD-10-CM | POA: Diagnosis not present

## 2018-03-16 DIAGNOSIS — I110 Hypertensive heart disease with heart failure: Secondary | ICD-10-CM

## 2018-03-16 DIAGNOSIS — M8580 Other specified disorders of bone density and structure, unspecified site: Secondary | ICD-10-CM

## 2018-03-16 NOTE — Progress Notes (Signed)
Patient ID: Rebecca Walter, female   DOB: 1941/09/14, 77 y.o.   MRN: [id number] Reviewed: Agree with Dr. Graylin Shiver documentation and management.

## 2018-03-16 NOTE — Patient Instructions (Addendum)
Thank you Ms. Elsbury for coming into clinic today!   I am happy to hear that your blood pressure is getting closer to goal! Today's blood pressure is 148/58 mmHg.  Your goal blood pressure is < 130/80 mm Hg.   I am sorry to hear that you are in pain today. Keep taking your Tylenol 325 mg and baclofen three times a day.   Do NOT take more than 4000 mg (12 tablets) a day of Tylenol!  I am going to check some labs today including vitamin D. I will call you to discuss your results.   Follow up with Dr. Juleen China in 6 weeks (Mid-May) to dicsuss your pain if it is still uncontrolled.

## 2018-03-16 NOTE — Progress Notes (Signed)
   S:   Patient arrives in good spirits and ambulating with assistance from her cane.    Presents to the clinic for hypertension medication management.  Patient was referred on 02/06/18.  Patient was last seen by Primary Care Provider on 02/06/18. Last seen by me on 03/02/18.    Medication compliance is reported to be adherent. Mentions hip pain after opening her fridge a few days ago. Currently taking Tylenol and baclofen which have adequately controlled her pain per patient report. Mentions the pain is exacerbating her anxiety which has led her to take her lorazepam daily (which she normally only takes as needed).    Patient mentioned she generally avoids sunlight due to a PMH of basal cell carcinoma. Interested in learning more about what her current vitamin D level is.  Current BP Medications include:  Amlodipine 10mg  daily, candesartan 32mg  QHS, spironolactone 25 mg daily, toresemide 20 mg every other day (prn weight increase)  Antihypertensives tried in the past include: HCTZ, benazepril, metoprolol, losartan  Patient returned to the clinic and reported home BP readings as follows: Systolic BP range: 854-627 mmHg Diastolic BP range: 03-50 mmHg Avg BP: 144/65 mmHg  O:   Last 3 Office BP readings: BP Readings from Last 3 Encounters:  03/16/18 (!) 148/58  03/02/18 (!) 136/58  02/23/18 (!) 152/60    BMET    Component Value Date/Time   NA 140 03/02/2018 1355   K 4.9 03/02/2018 1355   CL 102 03/02/2018 1355   CO2 25 03/02/2018 1355   GLUCOSE 132 (H) 03/02/2018 1355   GLUCOSE 240 (H) 07/15/2017 2000   BUN 29 (H) 03/02/2018 1355   CREATININE 0.97 03/02/2018 1355   CREATININE 0.93 09/16/2016 1410   CALCIUM 10.0 03/02/2018 1355   GFRNONAA 56 (L) 03/02/2018 1355   GFRNONAA 70 02/06/2015 1418   GFRAA 65 03/02/2018 1355   GFRAA 81 02/06/2015 1418   PE - Trace edema in bilateral lower extremities.   A/P: Hypertension longstanding controlled on current medications. Home readings  of BP (135/61) are slightly better than the in-office BP reading today of (148/58). Patient reported recent anxiety and pain increase which is likely contributory to higher BP today. Low diastolic pressure (<09 mmHg) and wide pulse pressure (>90 mmHg) limiting further adjustments of BP lowering medication for fear of increasing the risk of symptomatic hypotension and falls.   Continued all home BP medications at home dose until next visit.  Patient has a dx of osteopenia and is not currently on calcium or vit D supplementation. Due to a self-reported low exposure to sunlight, advanced age, obesity, and high risk for falls (current use of BZDs, multiple anti-HTN meds, and insulin) vitamin D level obtained.   Results reviewed and written information provided.   Total time in face-to-face counseling 20 minutes.   F/U Clinic Visit with Dr. Juleen China in 6 weeks (mid-May).  Patient seen with Hildred Alamin, PharmD Candidate.

## 2018-03-16 NOTE — Assessment & Plan Note (Signed)
Patient has a dx of osteopenia and is not currently on calcium or vit D supplementation. Due to a self-reported low exposure to sunlight, advanced age, obesity, and high risk for falls (current use of BZDs, multiple anti-HTN meds, and insulin) vitamin D level obtained.

## 2018-03-16 NOTE — Assessment & Plan Note (Signed)
Hypertension longstanding controlled on current medications. Home readings of BP (135/61) are slightly better than the in-office BP reading today of (148/58). Patient reported recent anxiety and pain increase which is likely contributory to higher BP today. Low diastolic pressure (<96 mmHg) and wide pulse pressure (>90 mmHg) limiting further adjustments of BP lowering medication for fear of increasing the risk of symptomatic hypotension and falls.   Continued all home BP medications at home dose until next visit.

## 2018-03-17 DIAGNOSIS — G4733 Obstructive sleep apnea (adult) (pediatric): Secondary | ICD-10-CM | POA: Diagnosis not present

## 2018-03-17 LAB — BASIC METABOLIC PANEL
BUN/Creatinine Ratio: 32 — ABNORMAL HIGH (ref 12–28)
BUN: 29 mg/dL — ABNORMAL HIGH (ref 8–27)
CALCIUM: 10.2 mg/dL (ref 8.7–10.3)
CHLORIDE: 102 mmol/L (ref 96–106)
CO2: 24 mmol/L (ref 20–29)
CREATININE: 0.92 mg/dL (ref 0.57–1.00)
GFR, EST AFRICAN AMERICAN: 69 mL/min/{1.73_m2} (ref >59)
GFR, EST NON AFRICAN AMERICAN: 60 mL/min/{1.73_m2} (ref >59)
Glucose: 128 mg/dL — ABNORMAL HIGH (ref 65–99)
Potassium: 4.9 mmol/L (ref 3.5–5.2)
Sodium: 140 mmol/L (ref 134–144)

## 2018-03-17 LAB — VITAMIN D 25 HYDROXY (VIT D DEFICIENCY, FRACTURES): Vit D, 25-Hydroxy: 28.4 ng/mL — ABNORMAL LOW (ref 30.0–100.0)

## 2018-03-20 ENCOUNTER — Telehealth: Payer: Self-pay | Admitting: Pharmacist

## 2018-03-20 DIAGNOSIS — I10 Essential (primary) hypertension: Secondary | ICD-10-CM

## 2018-03-20 NOTE — Telephone Encounter (Signed)
Patient  called to follow-up with labs.  Shared results of lab tests - normal with exception of Vitamin D.   Asked patient to discuss this result with her PCP - Dr. Juleen China at next visit.   Patient shared that her weight today was 188#.     Heart Rate continues to be 55-60 at rest.  No complaints of symptoms.  BP is ~120/60  States she is doing well with being active.  Encouraged to continue exercising at levels she can tolerate.    Follow-up with PCP RE Vitamin D at next visit with her.

## 2018-03-20 NOTE — Telephone Encounter (Signed)
-----   Message from Zenia Resides, MD sent at 03/17/2018  9:07 AM EDT -----   ----- Message ----- From: Lavone Neri Lab Results In Sent: 03/17/2018   8:19 AM To: Zenia Resides, MD

## 2018-03-20 NOTE — Telephone Encounter (Signed)
Pt called again to talk to dr Valentina Lucks. She has some questions about what they had talked about earlier

## 2018-03-23 MED ORDER — CANDESARTAN CILEXETIL 32 MG PO TABS: 16.0000 mg | ORAL_TABLET | Freq: Every day | ORAL | Status: DC

## 2018-03-23 NOTE — Addendum Note (Signed)
Addended by: Leavy Cella on: 03/23/2018 11:05 AM   Modules accepted: Orders

## 2018-03-23 NOTE — Telephone Encounter (Signed)
Called and discussed blood pressure medications.  Reports her blood pressure has been lower lately 124/84. Pulse has been 55.  She states she feels very good.  She has been exercising and having a better mental attitude.   Agreed after lengthy discussion to adjust candasartan to 1/2 tablet per day 32mg  will now be cut to 16mg  per day.   Reassess blood pressure at next visit.   Patient verblized appreciation for updated treatment plan.

## 2018-03-28 ENCOUNTER — Other Ambulatory Visit: Payer: Self-pay | Admitting: Internal Medicine

## 2018-03-28 ENCOUNTER — Telehealth: Payer: Self-pay | Admitting: Internal Medicine

## 2018-03-28 DIAGNOSIS — M25551 Pain in right hip: Secondary | ICD-10-CM

## 2018-03-28 MED ORDER — BACLOFEN 10 MG PO TABS: 10.0000 mg | ORAL_TABLET | Freq: Three times a day (TID) | ORAL | 2 refills | Status: DC | PRN

## 2018-03-28 NOTE — Progress Notes (Signed)
Have sent in 90 tablets of Baclofen per patient request.   Phill Myron, D.O. 03/28/2018, 2:42 PM PGY-3, [place]

## 2018-03-28 NOTE — Telephone Encounter (Signed)
Pt needs her Rx for her baclofen to be a quantity of 90 pills with each refill instead of 30. Pt said she has been taking 3 pills a day everyday and she is running out of the medication way early. Please advise

## 2018-03-29 ENCOUNTER — Other Ambulatory Visit: Payer: Self-pay | Admitting: Internal Medicine

## 2018-03-29 DIAGNOSIS — I10 Essential (primary) hypertension: Secondary | ICD-10-CM

## 2018-03-30 NOTE — Telephone Encounter (Signed)
Contacted for f/u of blood pressure.  Reports systolic reading of ~ 433 at recent check and improved symptoms since this AM.  Advised to take amlodipine 2.5mg  once daily today and daily.   We will reevaluate blood pressure with home readings.  Advised to return call for blood pressures > 295 systolic or if dizziness symptoms return.

## 2018-04-01 ENCOUNTER — Telehealth: Payer: Self-pay | Admitting: Internal Medicine

## 2018-04-01 NOTE — Telephone Encounter (Signed)
Received call to emergency after hours line. Patient concerned about high blood pressure readings. She had a SBP reading in the 160s, so she took an extra 2.5 mg amlodipine. She rechecked and it was in the 170s. She checked again and it was in the 180s. She acknowledges that monitoring her blood pressure makes her anxious. She had a headache earlier today, which has since resolved. She did not take tylenol for this. She took an ativan about 30 minutes ago. She denies chest pain, vision changes, and headache at present. Recommended not checking blood pressures until tomorrow unless she has symptoms. Inquired into what are relaxing activities for her and she thought getting some fresh air may help. Encouraged her to do so. She usually takes ativan BID so advised her to take another tablet in a few hours. She will call back if still having concerns.  Olene Floss, MD [place], PGY-3

## 2018-04-03 ENCOUNTER — Ambulatory Visit: Payer: Medicare Other | Admitting: Internal Medicine

## 2018-04-03 ENCOUNTER — Telehealth: Payer: Self-pay | Admitting: *Deleted

## 2018-04-03 ENCOUNTER — Telehealth: Payer: Self-pay | Admitting: Internal Medicine

## 2018-04-03 ENCOUNTER — Other Ambulatory Visit: Payer: Self-pay

## 2018-04-03 ENCOUNTER — Encounter: Payer: Self-pay | Admitting: Internal Medicine

## 2018-04-03 VITALS — BP 144/62 | HR 79 | Temp 98.8°F | Ht 60.0 in | Wt 194.4 lb

## 2018-04-03 DIAGNOSIS — F411 Generalized anxiety disorder: Secondary | ICD-10-CM | POA: Diagnosis not present

## 2018-04-03 MED ORDER — BUSPIRONE HCL 5 MG PO TABS
5.0000 mg | ORAL_TABLET | Freq: Two times a day (BID) | ORAL | 0 refills | Status: DC
Start: 2018-04-03 — End: 2018-04-26

## 2018-04-03 NOTE — Telephone Encounter (Signed)
Medications reconciliation  Atacand - pt is only taking it as needed.  If this is correct can we send a new script to pharmacy because Rebecca Walter is showing that pt is 38 days behind on this medication.  If pt should be taking daily, can we call and relay that message to her?

## 2018-04-03 NOTE — Telephone Encounter (Signed)
Pt is calling because she has had a rough weekend and would really like to speak to Dr. Valentina Lucks. Please call as soon as you can. JW

## 2018-04-03 NOTE — Patient Instructions (Signed)
I have sent in Buspar for your anxiety. Start taking this twice a day. We are starting with a low dose. We can increase as needed. I would recommend that if you are having a panic attack that you avoid taking your blood pressure as anxiety will temporarily increase your blood pressure and this will likely make you feel even more anxious. Please return in 2 weeks for check up of your symptoms. Please let me know if you have any problems with this medication.

## 2018-04-03 NOTE — Progress Notes (Signed)
Subjective:    Rebecca Walter - 77 y.o. female MRN [id number]  Date of birth: 1941-12-01  HPI  Rebecca Walter is here for SDA for panic attack. Reports she had a panic attack this morning. She was talking to a friend on the phone who got her stressed out. She was then behind schedule to take a bath. Then checked her BP today and it was elevated to 176/82. This caused her to become even more anxious. She missed her Carvedilol and had restarted her Amlodipine this weekend. Spoke to Dr. Valentina Lucks this morning. She is now taking her BB again. Has taken two doses of Ativan today. Is open to taking a daily medication for her anxiety but reports she has tried several medications in the past and had side effects. Unsure of the names of the medications. Reports they overall did not make her feel well and that one made her cry all the time. Also speaks a good deal about outside life stressors. The rent on her apartment continues to be increased. She has lived there for 22 years and is worried about being able to afford the rent. Says she is too old to move and that all of her family works and would be unable to help her. GAD-7 score of 13. PHQ-9 score of 2. No SI.   -  reports that she has never smoked. She has never used smokeless tobacco. - Review of Systems: Per HPI. - Past Medical History: Patient Active Problem List   Diagnosis Date Noted  . Elevated TSH 11/21/2017  . Hypertensive heart disease with CHF (congestive heart failure) ([place])   . Chronic diastolic CHF (congestive heart failure) ([place]) 07/15/2017  . Mild aortic stenosis 06/19/2017  . Mild mitral regurgitation 06/19/2017  . Moderate pulmonary valve insufficiency 06/19/2017  . Lumbar back pain with radiculopathy affecting right lower extremity 10/29/2016  . Allergic rhinitis 03/22/2011  . INSOMNIA 09/16/2009  . GASTROPARESIS 08/12/2009  . Anxiety 10/18/2008  . DYSKINESIA, ESOPHAGUS 09/13/2007  . CARCINOMA, BASAL CELL 05/26/2007  . Diabetes  mellitus, type II ([place]) 02/16/2007  . HLD (hyperlipidemia) 02/16/2007  . OBESITY, NOS 02/16/2007  . GASTROESOPHAGEAL REFLUX, NO ESOPHAGITIS 02/16/2007  . OSTEOARTHRITIS, MULTI SITES 02/16/2007  . Osteopenia after menopause 02/16/2007  . Gout 02/16/2007   - Medications: reviewed and updated   Objective:   Physical Exam BP (!) 144/62   Pulse 79   Temp 98.8 F (37.1 C) (Oral)   Ht 5' (1.524 m)   Wt 194 lb 6.4 oz (88.2 kg)   LMP  (LMP Unknown)   SpO2 96%   BMI 37.97 kg/m  Gen: NAD, alert, cooperative with exam, well-appearing Psych: anxious, normal thought content although does change topic frequently      Assessment & Plan:   1. Generalized anxiety disorder Patient with history of anxiety. Has had increasing symptoms and need for prn benzodiazepines. Believe patient would benefit from daily medication in an attempt to lessen symptom severity and prevent panic attacks. From chart review she has not tolerated Prozac, Lexapro, and Effexor in the past. Therefore, have elected to not prescribe SSRI/SNRI and will attempt trial of Buspar. Start with low dose of 5 mg BID and will plan to increase as needed. Discussed avoiding checking BP during acute periods of anxiety as will likely precipitate more anxiousness. Return in 2 weeks for follow up.  - busPIRone (BUSPAR) 5 MG tablet; Take 1 tablet (5 mg total) by mouth 2 (two) times daily.  Dispense: 60  tablet; Refill: 0   Phill Myron, D.O. 04/03/2018, 2:08 PM PGY-3, [place]

## 2018-04-03 NOTE — Telephone Encounter (Signed)
Patient states she has had a panic attack with elevated blood pressure this weekend.  She admits she has missed her carvedilol and has restarted her amlodipine at 10mg  daily.    She states adherence with candesartan.   (I am unsure how to explain the reported adherence from the pharmacy).    Patient willing to come into to be evaluated more by Dr. Juleen China her PCP.  Requested patient to make a follow up appointment in the upcoming few days.   Patient is aware that her anxiety is worsened by repeated checking of her blood pressure.   She admits that it may be better to check her BP less often.    Would Buspirone or SSRI be a better option to add to her regimen?

## 2018-04-04 ENCOUNTER — Telehealth: Payer: Self-pay | Admitting: Family Medicine

## 2018-04-04 ENCOUNTER — Telehealth: Payer: Self-pay | Admitting: Internal Medicine

## 2018-04-04 NOTE — Telephone Encounter (Signed)
Pt would like Rebecca Walter to call her to discuss a medication she is on. Please call pt back. Pt would like a call back today.

## 2018-04-04 NOTE — Telephone Encounter (Signed)
After Hours Emergency Line  Patient seen yesterday by Dr. Juleen China.  States she was prescribed Buspar to manage her anxiety.   She took two tablets today - one earlier today in the morning with no reaction, and another at 4:20 PM.  After the second dose she experienced her face "feeling funny" and she felt warm.  She is concerned she is having an allergic reaction to the medication, but also believes it may be because of a chicken salad she ate earlier in the day.  She reports she made the chicken salad herself and does not have known allergies to any of the ingredients.  Denies numbness and tingling.  No SOB or sensation of throat swelling.  No rash or itching.  Advised her to take a Benadryl and due to concern for allergic reaction, to present to UC or ED.  She states she is trying to contact her daughter now for a ride and seems extremely anxious over the phone.  I advised her it would be best to be evaluated by a provider to ensure she is not having an allergic reaction.  Red flags discussed and pt expressed good understanding of this.    Will route to PCP.   Lovenia Kim, MD [place], PGY-2

## 2018-04-04 NOTE — Telephone Encounter (Signed)
Returned patient's call. She is concerned that she had a bad night with her anxiety and was unable to sleep. She took 3 doses of Buspar. We discussed that it will take a longer trial to see if the medication has any effect and that we can increase dose as tolerated at next visit. Provided several minutes of support and reassurance.   Phill Myron, D.O. 04/04/2018, 1:05 PM PGY-3, [place]

## 2018-04-05 NOTE — Telephone Encounter (Signed)
Pt called nurse line requesting a call back from Dr. Juleen China about her possible medication reaction. She has already spoken with Dr. Reesa Chew but wanting to speak to Dr. Juleen China. Her call back [phone] [name] Cullens, RN

## 2018-04-06 ENCOUNTER — Telehealth: Payer: Self-pay | Admitting: Internal Medicine

## 2018-04-06 NOTE — Telephone Encounter (Signed)
I returned her call, as I am covering Dr. Alcario Drought inbox while she is away. Patient reports that she had a "warm feeling in her face" after she took the first dose of the Buspar. She states she has continued taking the medication and has not felt this sensation again. She has also not noticed any other side effects. I advised patient to continue taking the Buspar and to let us know if she has any other issues with it. Patient voiced understanding.

## 2018-04-06 NOTE — Telephone Encounter (Signed)
Patient called nursing line requesting to speak to Dr. Juleen China. I returned her call, as I am covering Dr. Alcario Drought inbox while she is away. Patient reports that she had a "warm feeling in her face" after she took the first dose of the Buspar. She states she has continued taking the medication and has not felt this sensation again. She has also not noticed any other side effects. I advised patient to continue taking the Buspar and to let us know if she has any other issues with it. Patient voiced understanding.  Hyman Bible, MD PGY-3

## 2018-04-08 ENCOUNTER — Other Ambulatory Visit: Payer: Self-pay

## 2018-04-08 ENCOUNTER — Emergency Department (HOSPITAL_COMMUNITY)
Admission: EM | Admit: 2018-04-08 | Discharge: 2018-04-08 | Disposition: A | Discharge status: Home / Self Care | Payer: Medicare Other | Attending: Emergency Medicine | Admitting: Emergency Medicine

## 2018-04-08 ENCOUNTER — Ambulatory Visit (HOSPITAL_COMMUNITY)
Admission: EM | Admit: 2018-04-08 | Discharge: 2018-04-08 | Disposition: A | Discharge status: Home / Self Care | Payer: Medicare Other | Source: Home / Self Care

## 2018-04-08 ENCOUNTER — Emergency Department (HOSPITAL_COMMUNITY): Payer: Medicare Other

## 2018-04-08 ENCOUNTER — Encounter (HOSPITAL_COMMUNITY): Payer: Self-pay | Admitting: Emergency Medicine

## 2018-04-08 DIAGNOSIS — R531 Weakness: Secondary | ICD-10-CM | POA: Insufficient documentation

## 2018-04-08 DIAGNOSIS — Z794 Long term (current) use of insulin: Secondary | ICD-10-CM | POA: Diagnosis not present

## 2018-04-08 DIAGNOSIS — I110 Hypertensive heart disease with heart failure: Secondary | ICD-10-CM | POA: Insufficient documentation

## 2018-04-08 DIAGNOSIS — I5032 Chronic diastolic (congestive) heart failure: Secondary | ICD-10-CM | POA: Insufficient documentation

## 2018-04-08 DIAGNOSIS — Z79899 Other long term (current) drug therapy: Secondary | ICD-10-CM | POA: Insufficient documentation

## 2018-04-08 DIAGNOSIS — R202 Paresthesia of skin: Secondary | ICD-10-CM | POA: Diagnosis not present

## 2018-04-08 DIAGNOSIS — Z7982 Long term (current) use of aspirin: Secondary | ICD-10-CM | POA: Insufficient documentation

## 2018-04-08 DIAGNOSIS — R0602 Shortness of breath: Secondary | ICD-10-CM | POA: Diagnosis not present

## 2018-04-08 DIAGNOSIS — Z85828 Personal history of other malignant neoplasm of skin: Secondary | ICD-10-CM | POA: Diagnosis not present

## 2018-04-08 DIAGNOSIS — R200 Anesthesia of skin: Secondary | ICD-10-CM | POA: Diagnosis not present

## 2018-04-08 LAB — COMPREHENSIVE METABOLIC PANEL
ALT: 18 U/L (ref 14–54)
AST: 17 U/L (ref 15–41)
Albumin: 4 g/dL (ref 3.5–5.0)
Alkaline Phosphatase: 51 U/L (ref 38–126)
Anion gap: 11 (ref 5–15)
BUN: 28 mg/dL — AB (ref 6–20)
CHLORIDE: 104 mmol/L (ref 101–111)
CO2: 22 mmol/L (ref 22–32)
CREATININE: 0.87 mg/dL (ref 0.44–1.00)
Calcium: 9.8 mg/dL (ref 8.9–10.3)
GFR calc non Af Amer: >60 mL/min (ref >60)
GLUCOSE: 201 mg/dL — AB (ref 65–99)
Potassium: 4.3 mmol/L (ref 3.5–5.1)
SODIUM: 137 mmol/L (ref 135–145)
Total Bilirubin: 0.4 mg/dL (ref 0.3–1.2)
Total Protein: 7.4 g/dL (ref 6.5–8.1)

## 2018-04-08 LAB — DIFFERENTIAL
BASOS ABS: 0 10*3/uL (ref 0.0–0.1)
Basophils Relative: 0 %
Eosinophils Absolute: 0.2 10*3/uL (ref 0.0–0.7)
Eosinophils Relative: 2 %
LYMPHS PCT: 30 %
Lymphs Abs: 2.8 10*3/uL (ref 0.7–4.0)
MONO ABS: 0.7 10*3/uL (ref 0.1–1.0)
MONOS PCT: 8 %
NEUTROS ABS: 5.5 10*3/uL (ref 1.7–7.7)
Neutrophils Relative %: 60 %

## 2018-04-08 LAB — ETHANOL: Alcohol, Ethyl (B): <10 mg/dL (ref <10)

## 2018-04-08 LAB — CBC
HCT: 37.5 % (ref 36.0–46.0)
Hemoglobin: 13 g/dL (ref 12.0–15.0)
MCH: 30.6 pg (ref 26.0–34.0)
MCHC: 34.7 g/dL (ref 30.0–36.0)
MCV: 88.2 fL (ref 78.0–100.0)
PLATELETS: 237 10*3/uL (ref 150–400)
RBC: 4.25 MIL/uL (ref 3.87–5.11)
RDW: 14.4 % (ref 11.5–15.5)
WBC: 9.2 10*3/uL (ref 4.0–10.5)

## 2018-04-08 LAB — APTT: APTT: 30 s (ref 24–36)

## 2018-04-08 LAB — PROTIME-INR
INR: 0.98
Prothrombin Time: 12.9 seconds (ref 11.4–15.2)

## 2018-04-08 LAB — I-STAT TROPONIN, ED: Troponin i, poc: 0 ng/mL (ref 0.00–0.08)

## 2018-04-08 NOTE — ED Notes (Signed)
Pt reports onset of sob while bathing, generalized weakness as well as numbness to left side of face and LUE; on anti-anxiety meds - changed to new one 6 days ago - thought as if it was the med change; reports was initially on Ativan x2-3 yrs for anxiety; MD abruptly stopped - did not wean off - immediately placed on buspiron; reports feeling "a little better" - arm and facial numbness has resolved - states still "weak"

## 2018-04-08 NOTE — ED Notes (Signed)
Pt denies neck pain, chest pain, n/v, light-headedness

## 2018-04-08 NOTE — Discharge Instructions (Addendum)
Continue your current medications as we discussed.  Follow-up with your doctor next week.  Return to the emergency room for worsening symptoms

## 2018-04-08 NOTE — ED Notes (Signed)
Sitting upright on stretcher with personal aide at bedside; no complaints at this time - graham crackers, eanut butter and ice water given per pt request

## 2018-04-08 NOTE — ED Notes (Signed)
Pt decided to go to the ER.

## 2018-04-08 NOTE — ED Triage Notes (Signed)
Patient presents to ED for assessment sudden onset of SOB while taking a bath today.  Patient states hx of anxiety, and feels similar, but she "just could not get any air in" this mroning.  Hx of CHF.  Patient states she also started buspirone on Monday and has been having episodes of numbness and tingling to her left face and shoulder, intermittently, no pattern, no symptoms at this time.

## 2018-04-08 NOTE — ED Notes (Signed)
Discharge instructions given - pt and daughter verbalized understanding - daughter assisted with getting dressed; wheeled out in wheelchair

## 2018-04-08 NOTE — ED Provider Notes (Signed)
[facility] EMERGENCY DEPARTMENT Provider Note   CSN: [id number] Arrival date & time: 04/08/18  1358     History   Chief Complaint Chief Complaint  Patient presents with  . Shortness of Breath    HPI Rebecca Walter is a 77 y.o. female.  HPI Pt started a new medications for anxiety.  The patient's primary care doctor was taking her off of benzodiazepines which she had been on for years and transitioning into her alternative medications.  Over this past week she has had intermittent episodes.  She will have numbness on the left side of her face and on her left arm.  The symptoms would last for an hour or so.  SHe would also feel weak in her entire body.  She would feel short of breath. No trouble with speech or vision.  Pt was worried she might be having a stroke so she came to the ED.   Past Medical History:  Diagnosis Date  . Anxiety   . Arthritis   . Chronic diastolic CHF (congestive heart failure) ([place])   . Diabetes mellitus   . GERD (gastroesophageal reflux disease)   . Gout   . Hyperlipidemia   . Hypertension   . Hypertensive heart disease with CHF (congestive heart failure) ([place])   . Mild aortic stenosis 06/2017  . Mild mitral regurgitation 06/2017  . Moderate pulmonary valve insufficiency 06/2017  . Osteopenia     Patient Active Problem List   Diagnosis Date Noted  . Elevated TSH 11/21/2017  . Hypertensive heart disease with CHF (congestive heart failure) ([place])   . Chronic diastolic CHF (congestive heart failure) ([place]) 07/15/2017  . Mild aortic stenosis 06/19/2017  . Mild mitral regurgitation 06/19/2017  . Moderate pulmonary valve insufficiency 06/19/2017  . Lumbar back pain with radiculopathy affecting right lower extremity 10/29/2016  . Allergic rhinitis 03/22/2011  . INSOMNIA 09/16/2009  . GASTROPARESIS 08/12/2009  . Anxiety 10/18/2008  . DYSKINESIA, ESOPHAGUS 09/13/2007  . CARCINOMA, BASAL CELL 05/26/2007  . Diabetes mellitus, type II  ([place]) 02/16/2007  . HLD (hyperlipidemia) 02/16/2007  . OBESITY, NOS 02/16/2007  . GASTROESOPHAGEAL REFLUX, NO ESOPHAGITIS 02/16/2007  . OSTEOARTHRITIS, MULTI SITES 02/16/2007  . Osteopenia after menopause 02/16/2007  . Gout 02/16/2007    Past Surgical History:  Procedure Laterality Date  . KIDNEY SURGERY    . KNEE SURGERY    . MENISECTOMY    . WRIST SURGERY       OB History   None      Home Medications    Prior to Admission medications   Medication Sig Start Date End Date Taking? Authorizing Provider  acetaminophen (TYLENOL) 325 MG tablet Take 325 mg by mouth every 8 (eight) hours as needed (pain).    Yes [provider]  allopurinol (ZYLOPRIM) 100 MG tablet TAKE 2 TABLETS BY MOUTH EVERY DAY 02/21/18  Yes Nicolette Bang, DO  alum & mag hydroxide-simeth (MAALOX/MYLANTA) 200-200-20 MG/5ML suspension Take 30 mLs by mouth every 6 (six) hours as needed for indigestion or heartburn. 07/20/17  Yes Nicolette Bang, DO  amLODipine (NORVASC) 10 MG tablet Take 10 mg by mouth daily.   Yes [provider]  aspirin 81 MG chewable tablet Chew 1 tablet (81 mg total) by mouth daily. 03/22/11  Yes Cletus Gash, MD  busPIRone (BUSPAR) 5 MG tablet Take 1 tablet (5 mg total) by mouth 2 (two) times daily. Patient taking differently: Take 2.5 mg by mouth 2 (two) times daily.  04/03/18  Yes Nicolette Bang, DO  Camphor-Eucalyptus-Menthol (VICKS VAPORUB EX) Place 1 application into both nostrils daily as needed (congestion).   Yes [provider]  candesartan (ATACAND) 32 MG tablet Take 0.5 tablets (16 mg total) by mouth at bedtime. Patient taking differently: Take 16-32 mg by mouth at bedtime. Take one tablet (32 mg) by mouth daily at bedtime, reduce to 1/2 tablet (16 mg) for heart rate <58 03/23/18  Yes Hensel, Jamal Collin, MD  carvedilol (COREG) 3.125 MG tablet Take 1 tablet (3.125 mg total) by mouth 2 (two) times daily with a meal. 02/23/18  Yes  Hensel, Jamal Collin, MD  insulin aspart (NOVOLOG FLEXPEN) 100 UNIT/ML FlexPen Inject 20 units into the skin three times daily before meals Patient taking differently: Inject 9-15 Units into the skin See admin instructions. Inject 15 units subcutaneously daily with breakfast, inject 9 units with lunch as needed for CBG 180 or more, and inject 9 units with supper 11/24/17  Yes Nicolette Bang, DO  LANTUS SOLOSTAR 100 UNIT/ML Solostar Pen INJECT 70 UNITS INTO THE SKIN EVERY MORNING. 02/20/18  Yes Nicolette Bang, DO  loratadine (CLARITIN) 10 MG tablet Take 10 mg by mouth daily.   Yes [provider]  lovastatin (MEVACOR) 40 MG tablet Take 40 mg by mouth daily with supper.    Yes [provider]  metFORMIN (GLUCOPHAGE-XR) 500 MG 24 hr tablet TAKE 1 TABLET TWICE A DAY BEFORE MEALS 09/05/17  Yes Nicolette Bang, DO  metoCLOPramide (REGLAN) 5 MG tablet TAKE 1 TABLET 30 MIN BEFORE MEALS AND 1 TABLET AT BEDTIME. FOUR TIMES/DAY 09/04/17  Yes [provider]  Multiple Vitamin (MULTIVITAMIN WITH MINERALS) TABS tablet Take 1 tablet by mouth daily.   Yes [provider]  OVER THE COUNTER MEDICATION Place 1 drop into both eyes daily. Over the counter preservative free lubricating eye drops   Yes [provider]  pantoprazole (PROTONIX) 40 MG tablet Take 40 mg by mouth 2 (two) times daily.    Yes [provider]  Probiotic Product (PROBIOTIC PEARLS) CAPS Take 1 capsule by mouth daily with lunch.    Yes [provider]  ranitidine (ZANTAC) 150 MG tablet Take 150 mg by mouth daily as needed for heartburn (acid reflux).    Yes [provider]  sodium chloride (OCEAN) 0.65 % SOLN nasal spray Place 1 spray into both nostrils daily.   Yes [provider]  spironolactone (ALDACTONE) 25 MG tablet TAKE 1 TABLET BY MOUTH EVERY DAY 04/03/18  Yes Nicolette Bang, DO  torsemide (DEMADEX) 20 MG tablet Take 1 tablet (20  mg total) by mouth every other day. 02/23/18  Yes Leavy Cella, RPH  vitamin E 400 UNIT capsule Take 400 Units by mouth daily.   Yes [provider]  ACCU-CHEK AVIVA PLUS test strip CHECK BLOOD SUGAR FIRST  THING IN THE MORNING BEFORE EATING, AFTER LUNCH, AND  AFTER DINNER TOTAL 3 TIMES  DAILY 12/05/17   Rogue Bussing, MD  amLODipine (NORVASC) 5 MG tablet Take 1 tablet (5 mg total) by mouth daily. Take with 2.5 mg tablet to = 7.5 mg daily Patient not taking: Reported on 04/08/2018 02/03/18   Fay Records, MD  B-D ULTRAFINE III SHORT PEN 31G X 8 MM MISC USE AS DIRECTED 12/14/17   Mayo, Pete Pelt, MD  baclofen (LIORESAL) 10 MG tablet Take 1 tablet (10 mg total) by mouth 3 (three) times daily as needed for muscle spasms. Patient not taking:  Reported on 04/08/2018 03/28/18   Nicolette Bang, DO  LORazepam (ATIVAN) 1 MG tablet Take 1 tablet (1 mg total) by mouth 2 (two) times daily as needed for anxiety. Patient not taking: Reported on 04/08/2018 01/27/18   Nicolette Bang, DO  spironolactone (ALDACTONE) 25 MG tablet Take 1 tablet (25 mg total) by mouth daily. Patient not taking: Reported on 04/08/2018 02/10/18   Zenia Resides, MD    Family History Family History  Problem Relation Age of Onset  . Stroke Mother   . Heart disease Father   . Stroke Father   . Hypertension Father   . Cancer Sister   . Cancer Brother   . Heart disease Brother   . Diabetes Maternal Uncle     Social History Social History   Tobacco Use  . Smoking status: Never Smoker  . Smokeless tobacco: Never Used  Substance Use Topics  . Alcohol use: No  . Drug use: No     Allergies   Hctz [hydrochlorothiazide]; Lotensin [benazepril]; Victoza [liraglutide]; Baclofen; Acyclovir and related; Beta adrenergic blockers; and Losartan   Review of Systems Review of Systems  All other systems reviewed and are negative.    Physical Exam Updated Vital Signs BP (!) 159/65 (BP Location:  Left Arm)   Pulse 63   Temp 98.4 F (36.9 C) (Oral)   Resp (!) 64   Ht 1.524 m (5')   Wt 86.2 kg (190 lb)   LMP  (LMP Unknown)   SpO2 96%   BMI 37.11 kg/m   Physical Exam  Constitutional: She is oriented to person, place, and time. She appears well-developed and well-nourished. No distress.  HENT:  Head: Normocephalic and atraumatic.  Right Ear: External ear normal.  Left Ear: External ear normal.  Mouth/Throat: Oropharynx is clear and moist.  Eyes: Conjunctivae are normal. Right eye exhibits no discharge. Left eye exhibits no discharge. No scleral icterus.  Neck: Neck supple. No tracheal deviation present.  Cardiovascular: Normal rate, regular rhythm and intact distal pulses.  Pulmonary/Chest: Effort normal and breath sounds normal. No stridor. No respiratory distress. She has no wheezes. She has no rales.  Abdominal: Soft. Bowel sounds are normal. She exhibits no distension. There is no tenderness. There is no rebound and no guarding.  Musculoskeletal: She exhibits no edema or tenderness.  Neurological: She is alert and oriented to person, place, and time. She has normal strength. No cranial nerve deficit (no facial droop, extraocular movements intact, no slurred speech) or sensory deficit. She exhibits normal muscle tone. She displays no seizure activity. Coordination normal.  No pronator drift bilateral upper extrem, able to hold both legs off bed for 5 seconds, sensation intact in all extremities, no visual field cuts, no left or right sided neglect, normal finger-nose exam bilaterally, no nystagmus noted   Skin: Skin is warm and dry. No rash noted.  Psychiatric: She has a normal mood and affect.  Nursing note and vitals reviewed.    ED Treatments / Results  Labs (all labs ordered are listed, but only abnormal results are displayed) Labs Reviewed  COMPREHENSIVE METABOLIC PANEL - Abnormal; Notable for the following components:      Result Value   Glucose, Bld 201 (*)     BUN 28 (*)    All other components within normal limits  ETHANOL  PROTIME-INR  APTT  CBC  DIFFERENTIAL  I-STAT TROPONIN, ED    EKG EKG Interpretation  Date/Time:  Saturday April 08 2018 14:03:59 EDT Ventricular  Rate:  69 PR Interval:  198 QRS Duration: 84 QT Interval:  402 QTC Calculation: 430 R Axis:   -40 Text Interpretation:  Sinus rhythm with Premature atrial complexes Left axis deviation Possible Anterior infarct , age undetermined Abnormal ECG st and t wave changes decreased since last tracing Confirmed by Dorie Rank [phone]) on 04/08/2018 3:13:30 PM   Radiology Dg Chest 2 View  Result Date: 04/08/2018 CLINICAL DATA:  Shortness of breath for several hours EXAM: CHEST - 2 VIEW COMPARISON:  07/15/2017 FINDINGS: Cardiac shadow is within normal limits. Aortic calcifications are seen. The lungs are well aerated bilaterally. Mild scarring is noted in the left lung base. Old healed rib fractures are noted bilaterally. Degenerative changes of the thoracic spine are seen. IMPRESSION: Chronic changes without acute abnormality. Electronically Signed   By: Inez Catalina M.D.   On: 04/08/2018 14:53   Ct Head Wo Contrast  Result Date: 04/08/2018 CLINICAL DATA:  Numbness and tingling to left face and shoulder. EXAM: CT HEAD WITHOUT CONTRAST TECHNIQUE: Contiguous axial images were obtained from the base of the skull through the vertex without intravenous contrast. COMPARISON:  07/15/2017 FINDINGS: Brain: No acute intracranial abnormality. Specifically, no hemorrhage, hydrocephalus, mass lesion, acute infarction, or significant intracranial injury. Vascular: No hyperdense vessel or unexpected calcification. Skull: No acute calvarial abnormality. Sinuses/Orbits: Visualized paranasal sinuses and mastoids clear. Orbital soft tissues unremarkable. Other: None IMPRESSION: No acute intracranial abnormality. Electronically Signed   By: Rolm Baptise M.D.   On: 04/08/2018 16:17    Procedures Procedures  (including critical care time)  Medications Ordered in ED Medications - No data to display   Initial Impression / Assessment and Plan / ED Course  I have reviewed the triage vital signs and the nursing notes.  Pertinent labs & imaging results that were available during my care of the patient were reviewed by me and considered in my medical decision making (see chart for details).   Patient presented to the emergency room for evaluation of paresthesias.  Patient's neurologic exam is reassuring.  Her ED workup is also reassuring.  Possible her symptoms may be related to her anxiety issues.  She did have some left-sided symptoms but was also having bilateral symptoms of I doubt TIA or stroke.  I discussed the findings with the patient.  I recommend she follow-up with her primary care doctor.  When she talked to the pharmacist they recommended having her dose.  She will continue doing that for now.   Final Clinical Impressions(s) / ED Diagnoses   Final diagnoses:  Paresthesia    ED Discharge Orders    None       Dorie Rank, MD 04/08/18 1851

## 2018-04-10 ENCOUNTER — Telehealth: Payer: Self-pay | Admitting: Internal Medicine

## 2018-04-10 NOTE — Telephone Encounter (Signed)
Pt called because she was at the ER over the weekend. The ER has lowered her dosage on her anxiety medication since she was having a lot of numbness on one side and not feeling the right way. Please call to adjust the medication. jw

## 2018-04-10 NOTE — Telephone Encounter (Signed)
Pt called again. She is concerned about why dr Valentina Lucks has called her back

## 2018-04-11 ENCOUNTER — Telehealth: Payer: Self-pay | Admitting: *Deleted

## 2018-04-11 NOTE — Telephone Encounter (Signed)
Message left on clinic nurse voice mail - requesting call back about her blood sugars since she is diabetic.    Returned call to patient - used Novolog 20 units and Lantus 70 units after breakfas.  Checked CBG at 2:45 pm and was 221.  Had lunch at 2:45 and used Novolog 19 units.  Rechecked CBG and was 164.  Was concerned because CBG was higher after breakfast.  Patient will continue to monitor her CBGs and keep log and bring to appt on 04/17/18.  Will call back if she has problems or concerns with CBGs.  Burna Forts, BSN, RN-BC

## 2018-04-11 NOTE — Telephone Encounter (Signed)
Contacted patient.  She was worried about her anxiety symptoms and asked if she could take buspirone and still take her remaining supply of lorazepam.  We discussed the gradual onset of benefit with buspirone.   She inquired if she could take lorazepam and buspirone.  I discussed the long-term goal of using less lorazepam and suggested it would be reasonable to take 0.5-1mg  of her lorazepam as needed with a goal of using less in the upcoming weeks.  She was agreeable with trying to use less lorazepam and agreed we would reevaluate the benefit of buspirone in the near future.  She expressed gratitude at the end of the call.  Plan to follow-up with PCP PRN.   Suggest consideration of buspirone 10mg  BID with the next prescription.

## 2018-04-12 NOTE — Telephone Encounter (Signed)
Dr. Juleen China is out of town this week on vacation and I am covering her inbox. I will call patient today to see if I can be helpful to her. Thanks for your help.

## 2018-04-12 NOTE — Telephone Encounter (Signed)
Thank you, Tomasa Hosteller!

## 2018-04-15 NOTE — Telephone Encounter (Signed)
**  After Hours/ Emergency Line Call*  Received a call to report that Rebecca Walter is having an anxiety problem. She was recently switched to a new medication but feels that her anxiety is not well controlled today. She took metaclopramide today for gastroparesis and she feels she "reacted like she had an overdose" or an allergy. She states her face became white, lips felt "funny" for an hour. Denies heart racing or diaphoresis. No focal neurologic symptoms reported. No throat swelling or dyspnea. She feels normal now. No symptoms of anaphylaxis.   Advised that because she has been on the metoclopramide for years it would be unusual to have an allergic reaction to it now. She can discuss further with her PCP at her next appt on Monday. Red flags and reasons to come in sooner discussed. Red flags discussed.  Will forward to PCP.  Everrett Coombe, MD PGY-2, [facility]

## 2018-04-17 ENCOUNTER — Other Ambulatory Visit: Payer: Self-pay

## 2018-04-17 ENCOUNTER — Encounter: Payer: Self-pay | Admitting: Psychology

## 2018-04-17 ENCOUNTER — Ambulatory Visit: Payer: Medicare Other | Admitting: Internal Medicine

## 2018-04-17 ENCOUNTER — Encounter: Payer: Self-pay | Admitting: Internal Medicine

## 2018-04-17 VITALS — BP 132/50 | HR 66 | Temp 98.3°F | Ht 60.0 in | Wt 194.8 lb

## 2018-04-17 DIAGNOSIS — F419 Anxiety disorder, unspecified: Secondary | ICD-10-CM

## 2018-04-17 DIAGNOSIS — F411 Generalized anxiety disorder: Secondary | ICD-10-CM

## 2018-04-17 DIAGNOSIS — H6983 Other specified disorders of Eustachian tube, bilateral: Secondary | ICD-10-CM

## 2018-04-17 DIAGNOSIS — G4733 Obstructive sleep apnea (adult) (pediatric): Secondary | ICD-10-CM | POA: Diagnosis not present

## 2018-04-17 MED ORDER — MOMETASONE FUROATE 50 MCG/ACT NA SUSP: 2.0000 | Freq: Every day | NASAL | 12 refills | Status: DC

## 2018-04-17 NOTE — Progress Notes (Signed)
   Subjective:    Jala C Wasser - 77 y.o. female MRN [id number]  Date of birth: Apr 24, 1941  HPI  Coletta C Ohern is here for follow up of anxiety.  Anxiety: GAD-7 score of 9. Reports feeling nervous and on edge nearly every day. Has trouble relaxing nearly every day. She is currently taking Buspar 5 mg BID but dividing the medication into four doses by cutting the tablets in half. Due to her sensitivity to medication, she feels that this works better for her and minimizes side effects. She has not taken her Xanax for several days due to a reported bad reaction. She is currently unwilling to consider a dose increase in the Buspar.   Ear Pain: Reports bilateral ear pain and sensation of her ears needing to pop. She has tried cleaning out her ears and symptoms have not improved. Has a history of seasonal allergies. Has previously used Nasonex in the past and it improved her symptoms, requests refill today.   -  reports that she has never smoked. She has never used smokeless tobacco. - Review of Systems: Per HPI. - Past Medical History: Patient Active Problem List   Diagnosis Date Noted  . Elevated TSH 11/21/2017  . Hypertensive heart disease with CHF (congestive heart failure) ([place])   . Chronic diastolic CHF (congestive heart failure) ([place]) 07/15/2017  . Mild aortic stenosis 06/19/2017  . Mild mitral regurgitation 06/19/2017  . Moderate pulmonary valve insufficiency 06/19/2017  . Lumbar back pain with radiculopathy affecting right lower extremity 10/29/2016  . Allergic rhinitis 03/22/2011  . INSOMNIA 09/16/2009  . GASTROPARESIS 08/12/2009  . Anxiety 10/18/2008  . DYSKINESIA, ESOPHAGUS 09/13/2007  . CARCINOMA, BASAL CELL 05/26/2007  . Diabetes mellitus, type II ([place]) 02/16/2007  . HLD (hyperlipidemia) 02/16/2007  . OBESITY, NOS 02/16/2007  . GASTROESOPHAGEAL REFLUX, NO ESOPHAGITIS 02/16/2007  . OSTEOARTHRITIS, MULTI SITES 02/16/2007  . Osteopenia after menopause 02/16/2007  . Gout  02/16/2007   - Medications: reviewed and updated   Objective:   Physical Exam BP (!) 132/50   Pulse 66   Temp 98.3 F (36.8 C) (Oral)   Ht 5' (1.524 m)   Wt 194 lb 12.8 oz (88.4 kg)   LMP  (LMP Unknown)   SpO2 96%   BMI 38.04 kg/m  Gen: NAD, alert, cooperative with exam, well-appearing HEENT: Nasal turbinates edematous bilaterally, TMs normal bilaterally, ear canals without cerumen Psych: overall anxious affect, repetitive in her concerns   Assessment & Plan:   1. Dysfunction of both eustachian tubes Symptoms consistent with eustachian tube dysfunction likely related to seasonal allergies. No evidence of AOM, otitis externa, or cerumen impaction on exam. Will refill nasal steroid spray to help control symptoms.  - mometasone (NASONEX) 50 MCG/ACT nasal spray; Place 2 sprays into the nose daily.  Dispense: 17 g; Refill: 12  2. Generalized anxiety disorder Anxiety somewhat improved on GAD-7 scoring; decrease in score from 13 to 9 in 2 weeks. Patient has long standing history of GAD. Believe she would benefit from an increase in her Buspar dosing but patient resistant due to concern for potential side effects. Will continue medication as is and encouraged continued weaning of benzodiazepine. Also discussed possibility of ICC involvement. Patient is open to scheduling an appointment and briefly met with Larene Beach to discuss and to schedule an appointment.    Phill Myron, D.O. 04/17/2018, 2:23 PM PGY-3, [place]

## 2018-04-17 NOTE — Progress Notes (Addendum)
Dr. Juleen China requested a [place].   Presenting Issue:  Anxiety  I briefly met with Rebecca Walter to explain the role of Integrative Care and the services that we could provide to help her with anxiety. She requested an appointment, which was scheduled for Monday, May 6 at 1:30 pm. Because I was in the middle of another patient's visit, I will wait to obtain background information concerning Rebecca Walter at that time.    Assessment / Plan / Recommendations: Appointment made for Monday, May 6 at 1:30 pm  [facility] completed.

## 2018-04-17 NOTE — Assessment & Plan Note (Signed)
Assessment / Plan / Recommendations: Appointment made for Monday, May 6 at 1:30 pm

## 2018-04-17 NOTE — Patient Instructions (Addendum)
I have prescribed the nasal steroid. See if this helps with the sensation of fullness in your nose and ears.   Keep taking the Buspar as you are. It is okay to take 0.5mg -1 mg of Ativan per day as needed for anxiety over the next few weeks as we try to wean you off of this medication. I would recommend following up with our behavioral health counselors here at the clinic as this can be helpful for managing anxiety.

## 2018-04-18 ENCOUNTER — Other Ambulatory Visit: Payer: Medicare Other

## 2018-04-18 ENCOUNTER — Telehealth: Payer: Self-pay | Admitting: Internal Medicine

## 2018-04-18 NOTE — Telephone Encounter (Signed)
Pt called and would like to speak to Dr. Juleen China about the nose spray that was called in. It is very expensive. And she has a question also that she needs to ask. JW

## 2018-04-19 NOTE — Telephone Encounter (Signed)
Pt calling again to speak to Dr. Juleen China. Pt said her nose spray cost $45 and she cannot pay that. Pt would like to see what else she can get in the place of this spray. Please advise

## 2018-04-21 ENCOUNTER — Other Ambulatory Visit: Payer: Medicare Other

## 2018-04-21 ENCOUNTER — Other Ambulatory Visit: Payer: Self-pay | Admitting: Internal Medicine

## 2018-04-21 MED ORDER — FLUTICASONE PROPIONATE 50 MCG/ACT NA SUSP: 2.0000 | Freq: Every day | NASAL | 6 refills | Status: DC

## 2018-04-21 NOTE — Progress Notes (Signed)
I have sent in Fluticasone. This should hopefully be more affordable and works the same way as the Nasonex.   Phill Myron, D.O. 04/21/2018, 2:37 PM PGY-3, [place]

## 2018-04-21 NOTE — Telephone Encounter (Signed)
Dr. Juleen China has called in fluticasone. Pt informed. Ottis Stain, CMA

## 2018-04-24 ENCOUNTER — Ambulatory Visit: Payer: Medicare Other | Admitting: Psychology

## 2018-04-24 DIAGNOSIS — F419 Anxiety disorder, unspecified: Secondary | ICD-10-CM

## 2018-04-24 NOTE — Assessment & Plan Note (Addendum)
Assessment / Plan / Recommendations: Rebecca Walter is doing very well; she reports minimal depression (PHQ9 = 3, no SI, not difficult at all) and mild anxiety (GAD7 = 6, not difficult at all). She feels that she has successfully weaned herself from benzo medications and that her new medication (Buspar) is helping her. She is still afraid of driving but thinks that she can ease back into it with help from her home health aide. We discussed sleep hygiene techniques. She does have a CPAP machine and wears it every night. We practiced diaphragmatic breathing. Rebecca Walter feels that she has improved and will schedule a follow-up visit as needed.

## 2018-04-24 NOTE — Patient Instructions (Addendum)
Rebecca Walter, it was a pleasure to see you today. I am so glad that you are doing well. Continue to work on your breathing and sleep hygiene. Return to see me as needed.

## 2018-04-24 NOTE — Progress Notes (Signed)
Rebecca Walter returns to discuss her anxiety. We had a very brief conversation during a warm handoff so I am using that format now to give a more detailed history.  Presenting Issue:  anxiety  Report of symptoms:  Worry about getting things done, panic attacks, fear of driving.  Duration of CURRENT symptoms:  30+years  Age of onset of first mood disturbance:  Around age 77  Impact on function:  Worried feelings, fear of driving  Psychiatric History - Diagnoses: depression in past, anxiety - Hospitalizations: no - Pharmacotherapy: has tried Librium, Ativan, Prozac and Lexapro. Currently taking Buspar. - Outpatient therapy: no  Family history of psychiatric issues:  Son has depression and takes Prozac.   Current and history of substance use:  none  Medical conditions that might explain or contribute to symptoms:  Diabetes, congestive heart failure  PHQ-9:  3 (minimal, no SI, not difficult at all) GAD-7:  6 (mild, not difficult at all) MDQ (if indicated):  Not given; patient does not report impulsive behavior.  Visit Complete

## 2018-04-26 ENCOUNTER — Other Ambulatory Visit: Payer: Self-pay | Admitting: Internal Medicine

## 2018-04-26 DIAGNOSIS — F411 Generalized anxiety disorder: Secondary | ICD-10-CM

## 2018-05-02 ENCOUNTER — Telehealth: Payer: Self-pay

## 2018-05-02 NOTE — Telephone Encounter (Signed)
Pt called nurse line concerned because she states her BP was "very low" and she took her BP medication. Returned patients call and she states her BP was 128/64 and 118/64. Pt reassured that these pressures are not low- although they may be lower than what is "normal" for her. Pt denies any dizziness or feeling light headed. Pt advised to continue to monitor her BP but those numbers are not worrisome. [name] Cullens, RN

## 2018-05-08 ENCOUNTER — Ambulatory Visit: Payer: Medicare Other | Admitting: Psychology

## 2018-05-08 DIAGNOSIS — F419 Anxiety disorder, unspecified: Secondary | ICD-10-CM

## 2018-05-08 NOTE — Progress Notes (Signed)
Reason for follow-up:  Elvena returns to follow up on her anxiety and transition from Ativan to [place].  Issues discussed:  The patient reports that she is doing well and has little stress/anxiety other than her finances.

## 2018-05-08 NOTE — Patient Instructions (Addendum)
Jazmeen: it was a pleasure to see you today! I am proud of you for making such good progress with regard to your anxiety. It sounds like you are doing a great job with your diabetes regimen too. Keep up the good work. I have provided you with information concerning housing resources. Return to see me as needed.  -[place]

## 2018-05-08 NOTE — Assessment & Plan Note (Signed)
Assessment/Plan/Recommendations: Rebecca Walter reported that she had some difficult weaning from the Ativan at first, but was able to do it. She is finding the Buspar and her new blood pressure medication helpful. Her affect is very upbeat and positive today. She is troubled most about her finances, due to her increased rent. I provided her with some information concerning low cost rental options and services. Rebecca Walter finds it helpful to pray and reports that she has support of her friends and family. She is doing well and will return as needed.

## 2018-05-09 ENCOUNTER — Other Ambulatory Visit: Payer: Self-pay | Admitting: Internal Medicine

## 2018-05-09 DIAGNOSIS — I10 Essential (primary) hypertension: Secondary | ICD-10-CM

## 2018-05-10 ENCOUNTER — Other Ambulatory Visit: Payer: Self-pay

## 2018-05-10 NOTE — Patient Outreach (Signed)
[facility]) Care Management  05/10/2018  Rebecca Walter [birth year] [id number]   Medication Adherence call to Rebecca Walter spoke with patient she is only taking 1 tablet of Lovastatin 40 mg instead of 2 tablets a day  per doctor's new instructions patient is showing past due under [name].Rebecca Walter still has medication and wont need it until [name].   [facility] Management Direct Dial [phone]  Fax [phone] [name]

## 2018-05-12 ENCOUNTER — Ambulatory Visit: Payer: Medicare Other | Admitting: Podiatry

## 2018-05-12 ENCOUNTER — Encounter: Payer: Self-pay | Admitting: Podiatry

## 2018-05-12 DIAGNOSIS — M79675 Pain in left toe(s): Secondary | ICD-10-CM

## 2018-05-12 DIAGNOSIS — M79674 Pain in right toe(s): Secondary | ICD-10-CM

## 2018-05-12 DIAGNOSIS — B351 Tinea unguium: Secondary | ICD-10-CM

## 2018-05-15 ENCOUNTER — Other Ambulatory Visit: Payer: Self-pay | Admitting: Internal Medicine

## 2018-05-15 NOTE — Progress Notes (Signed)
Subjective:   Patient ID: Rebecca Walter, female   DOB: 77 y.o.   MRN: [id number]   HPI Patient is found to have nail disease 1-5 both feet that are thick yellow brittle and become painful with shoe gear   ROS      Objective:  Physical Exam  Neurovascular status unchanged with thick yellow brittle nailbeds 1-5 both feet     Assessment:  Mycotic nail infection with pain 1-5 both feet     Plan:  Debride painful nailbeds 1-5 both feet with no iatrogenic bleeding noted

## 2018-05-16 ENCOUNTER — Ambulatory Visit: Payer: Medicare Other | Admitting: Internal Medicine

## 2018-05-16 ENCOUNTER — Encounter: Payer: Self-pay | Admitting: Internal Medicine

## 2018-05-16 ENCOUNTER — Other Ambulatory Visit: Payer: Self-pay

## 2018-05-16 VITALS — BP 122/74 | HR 67 | Temp 98.0°F | Ht 60.0 in | Wt 197.0 lb

## 2018-05-16 DIAGNOSIS — F411 Generalized anxiety disorder: Secondary | ICD-10-CM

## 2018-05-16 DIAGNOSIS — E1143 Type 2 diabetes mellitus with diabetic autonomic (poly)neuropathy: Secondary | ICD-10-CM | POA: Diagnosis not present

## 2018-05-16 DIAGNOSIS — F419 Anxiety disorder, unspecified: Secondary | ICD-10-CM | POA: Diagnosis not present

## 2018-05-16 DIAGNOSIS — Z794 Long term (current) use of insulin: Secondary | ICD-10-CM

## 2018-05-16 LAB — POCT GLYCOSYLATED HEMOGLOBIN (HGB A1C): HBA1C, POC (CONTROLLED DIABETIC RANGE): 7 % (ref 0.0–7.0)

## 2018-05-16 NOTE — Assessment & Plan Note (Signed)
Patient has successfully weaned off Ativan and we will not be refilling medication. She is hesitant to increase Buspar as she is very sensitive to medication changes. Continue with current dosage. GAD-7 score continues to improve. Encouraged follow up with [facility] if she feels this is helpful.

## 2018-05-16 NOTE — Patient Instructions (Signed)
It was so good to see you today!   Today we talked about:  1. Diabetes. Your A1c is 7.0% and looks great. Continue your current insulin regimen.  2. Anxiety. I am so glad you are feeling better. We will not adjust your Buspar. I would encourage you to follow up with Larene Beach if you feel those sessions are helpful.   You should follow up in 3 months with Dr. Shan Levans. It has been a pleasure taking care of you. I will be here 1 more month if you have concerns or get sick. Of course, Dr. Valentina Lucks is always happy to assist in your care too.   Take Care,   Dr. Juleen China

## 2018-05-16 NOTE — Progress Notes (Signed)
   Subjective:    Rebecca Walter - 77 y.o. female MRN [id number]  Date of birth: Dec 01, 1941  HPI  Rebecca Walter is here for follow up of chronic medical conditions.  T2DM: Patient reports that she has been checking her CBGs 3x per day. Fasting CBG was 155 this AM. This is a typical result. CBG checks later in the day tend to range from 120-140. She takes Lantus 70 units each morning and uses a sliding scale for her Novolog. Has an appointment in July with her eye doctor. She denies polyuria and polydipsia.   Anxiety: Patient reports that her anxiety has been the best controlled it has been for years. She is taking Buspar 2.5 mg four times per day. She cuts the 5 mg tablets in half to try to avoid medication side effects. She has not had any panic attacks. Has not taken any more Ativan and is very happy to be off of this medication completely. Notes that Ativan really brought her "down" and that she feels much more active without it. GAD-7 score of 5.   -  reports that she has never smoked. She has never used smokeless tobacco. - Review of Systems: Per HPI. - Past Medical History: Patient Active Problem List   Diagnosis Date Noted  . Elevated TSH 11/21/2017  . Hypertensive heart disease with CHF (congestive heart failure) ([place])   . Chronic diastolic CHF (congestive heart failure) ([place]) 07/15/2017  . Mild aortic stenosis 06/19/2017  . Mild mitral regurgitation 06/19/2017  . Moderate pulmonary valve insufficiency 06/19/2017  . Lumbar back pain with radiculopathy affecting right lower extremity 10/29/2016  . Allergic rhinitis 03/22/2011  . INSOMNIA 09/16/2009  . GASTROPARESIS 08/12/2009  . Anxiety 10/18/2008  . DYSKINESIA, ESOPHAGUS 09/13/2007  . CARCINOMA, BASAL CELL 05/26/2007  . Diabetes mellitus, type II ([place]) 02/16/2007  . HLD (hyperlipidemia) 02/16/2007  . OBESITY, NOS 02/16/2007  . GASTROESOPHAGEAL REFLUX, NO ESOPHAGITIS 02/16/2007  . OSTEOARTHRITIS, MULTI SITES 02/16/2007  .  Osteopenia after menopause 02/16/2007  . Gout 02/16/2007   - Medications: reviewed and updated   Objective:   Physical Exam BP 122/74   Pulse 67   Temp 98 F (36.7 C)   Ht 5' (1.524 m)   Wt 197 lb (89.4 kg)   LMP  (LMP Unknown)   SpO2 96%   BMI 38.47 kg/m  Gen: NAD, alert, cooperative with exam, well-appearing Psych: good insight, alert and oriented    Assessment & Plan:   Diabetes mellitus, type II ([place]) Well controlled with A1c result of 7.0%. Patient is slightly discouraged by increase from 6.9 to 7.0 today but discussed that this is a very appropriate result for her age. Review of typical CBG results seem reasonable and in range as well. Will not adjust any medications today. Return in 3 months for follow up.   Anxiety Patient has successfully weaned off Ativan and we will not be refilling medication. She is hesitant to increase Buspar as she is very sensitive to medication changes. Continue with current dosage. GAD-7 score continues to improve. Encouraged follow up with [facility] if she feels this is helpful.    Phill Myron, D.O. 05/16/2018, 2:12 PM PGY-3, [place]

## 2018-05-16 NOTE — Assessment & Plan Note (Signed)
Well controlled with A1c result of 7.0%. Patient is slightly discouraged by increase from 6.9 to 7.0 today but discussed that this is a very appropriate result for her age. Review of typical CBG results seem reasonable and in range as well. Will not adjust any medications today. Return in 3 months for follow up.

## 2018-05-17 ENCOUNTER — Other Ambulatory Visit: Payer: Self-pay | Admitting: Internal Medicine

## 2018-05-17 DIAGNOSIS — G4733 Obstructive sleep apnea (adult) (pediatric): Secondary | ICD-10-CM | POA: Diagnosis not present

## 2018-05-17 DIAGNOSIS — I10 Essential (primary) hypertension: Secondary | ICD-10-CM

## 2018-05-17 NOTE — Telephone Encounter (Signed)
Pt is calling because he would like to go to the 7 mg on her Buspirone. The pharmacy does carry the dosage and she would like to try that instead on the 10 mg. Please call to discuss.

## 2018-05-20 ENCOUNTER — Other Ambulatory Visit: Payer: Self-pay | Admitting: Internal Medicine

## 2018-05-20 DIAGNOSIS — F411 Generalized anxiety disorder: Secondary | ICD-10-CM

## 2018-05-24 ENCOUNTER — Telehealth: Payer: Self-pay | Admitting: Internal Medicine

## 2018-05-24 NOTE — Telephone Encounter (Signed)
Pt is calling because he would like to go to the 7 mg on her Buspirone. The pharmacy does carry the dosage and she would like to try that instead on the 10 mg. Please call to discuss. jw

## 2018-05-25 NOTE — Telephone Encounter (Signed)
Pt called back again and would like Dr Juleen China to give her a call concerning her Buspirone.

## 2018-05-26 ENCOUNTER — Other Ambulatory Visit: Payer: Self-pay | Admitting: Internal Medicine

## 2018-05-26 DIAGNOSIS — F411 Generalized anxiety disorder: Secondary | ICD-10-CM

## 2018-05-26 MED ORDER — BUSPIRONE HCL 7.5 MG PO TABS: 7.5000 mg | ORAL_TABLET | Freq: Two times a day (BID) | ORAL | 1 refills | Status: DC

## 2018-05-26 NOTE — Progress Notes (Signed)
Increasing the Buspar to 7.5 mg BID is reasonable as patient was hesitant to increase to 10 mg BID due to potential side effects. [facility] I would ask that she return in 1 month for follow up of her mood with the medication change.   Phill Myron, D.O. 05/26/2018, 12:55 PM PGY-3, [place]

## 2018-05-26 NOTE — Progress Notes (Signed)
Pt informed. Pt asked if she should be taking vit D or calcium. Please advise. Ottis Stain, CMA

## 2018-06-03 ENCOUNTER — Other Ambulatory Visit: Payer: Self-pay | Admitting: Internal Medicine

## 2018-06-03 ENCOUNTER — Other Ambulatory Visit: Payer: Self-pay | Admitting: Family Medicine

## 2018-06-03 DIAGNOSIS — I5032 Chronic diastolic (congestive) heart failure: Principal | ICD-10-CM

## 2018-06-03 DIAGNOSIS — I10 Essential (primary) hypertension: Secondary | ICD-10-CM

## 2018-06-03 DIAGNOSIS — I110 Hypertensive heart disease with heart failure: Secondary | ICD-10-CM

## 2018-06-06 ENCOUNTER — Other Ambulatory Visit: Payer: Self-pay | Admitting: Internal Medicine

## 2018-06-07 ENCOUNTER — Other Ambulatory Visit: Payer: Self-pay | Admitting: Internal Medicine

## 2018-06-07 DIAGNOSIS — I10 Essential (primary) hypertension: Secondary | ICD-10-CM

## 2018-06-08 ENCOUNTER — Other Ambulatory Visit: Payer: Self-pay

## 2018-06-14 ENCOUNTER — Other Ambulatory Visit: Payer: Self-pay | Admitting: Internal Medicine

## 2018-06-14 ENCOUNTER — Telehealth: Payer: Self-pay | Admitting: Internal Medicine

## 2018-06-14 DIAGNOSIS — I10 Essential (primary) hypertension: Secondary | ICD-10-CM

## 2018-06-14 MED ORDER — GLUCOSE BLOOD VI STRP: ORAL_STRIP | 3 refills | Status: DC

## 2018-06-14 NOTE — Telephone Encounter (Signed)
Pt called and said she has been in touch with [facility] to get her a new blood sugar meter. [facility] told pt they have contacted our office to try and get the Rx for the meter but have no had any luck. Pt said Juleen China needs to call [facility] at [phone] to get this taken care of. Please advise

## 2018-06-15 ENCOUNTER — Other Ambulatory Visit: Payer: Self-pay | Admitting: Internal Medicine

## 2018-06-15 MED ORDER — ACCU-CHEK AVIVA DEVI: 0 refills | Status: DC

## 2018-06-15 NOTE — Progress Notes (Signed)
Pt informed of below. Zimmerman Rumple, April D, CMA  

## 2018-06-15 NOTE — Progress Notes (Signed)
Have sent Rx to [facility] for a new meter.   Phill Myron, D.O. 06/15/2018, 9:52 AM PGY-3, [place]

## 2018-06-17 ENCOUNTER — Other Ambulatory Visit: Payer: Self-pay | Admitting: Internal Medicine

## 2018-06-17 DIAGNOSIS — G4733 Obstructive sleep apnea (adult) (pediatric): Secondary | ICD-10-CM | POA: Diagnosis not present

## 2018-06-19 ENCOUNTER — Other Ambulatory Visit: Payer: Self-pay

## 2018-06-19 DIAGNOSIS — I10 Essential (primary) hypertension: Secondary | ICD-10-CM

## 2018-06-19 MED ORDER — CANDESARTAN CILEXETIL 32 MG PO TABS: 16.0000 mg | ORAL_TABLET | Freq: Every day | ORAL | Status: DC

## 2018-06-28 ENCOUNTER — Other Ambulatory Visit: Payer: Self-pay

## 2018-06-28 MED ORDER — INSULIN GLARGINE 100 UNIT/ML SOLOSTAR PEN: PEN_INJECTOR | SUBCUTANEOUS | 2 refills | Status: DC

## 2018-06-28 NOTE — Patient Outreach (Signed)
[place] [facility]) Care Management  06/28/2018  Rebecca Walter [birth year] [id number]   Medication Adherence call to Mrs. Rebecca Walter patient did not answer she is due on Lovastatin 40 mg and Candesartan 32 mg. [facility] patient pick up on Rebecca Walter [address] but they fill the prescription on Rebecca Walter [address].Rebecca Walter is showing past due under [place].  [facility] Management Direct Dial [phone]  Fax [phone] [email or web address]

## 2018-06-28 NOTE — Telephone Encounter (Signed)
Pt is very anxious about getting her insulin.   She states that she only has 2.5 doses left.  She has called pharmacy and [place] several times today. Advised of 24 - 48 hour policy, she is almost in tears and states that " I understand but Im so scared to run out of insulin".  Ok given from Dr. Erin Hearing to fill. Fleeger, Salome Spotted, CMA

## 2018-06-28 NOTE — Telephone Encounter (Signed)
Pt called nurse checking status of pended medication, informed medication is pending approval.

## 2018-06-28 NOTE — Telephone Encounter (Signed)
Pt called again about her insulin pen. She is worried that she is so low and wants to know if this can be sent to her pharmacy today. Please call pt and let her know when this has been done.

## 2018-06-30 ENCOUNTER — Other Ambulatory Visit: Payer: Self-pay | Admitting: *Deleted

## 2018-06-30 ENCOUNTER — Telehealth: Payer: Self-pay

## 2018-06-30 ENCOUNTER — Telehealth: Payer: Self-pay | Admitting: Pharmacist

## 2018-06-30 MED ORDER — LOVASTATIN 40 MG PO TABS: 40.0000 mg | ORAL_TABLET | Freq: Every day | ORAL | 3 refills | Status: DC

## 2018-06-30 NOTE — Telephone Encounter (Signed)
Pt called nurse line asking about her medications and her recent fluid loss. Pt states she felt herself "swell up" over the last 1.5 weeks and started taking her Torsemide 20mg  daily verses every other day. She has been doing this over the course of the last several days with no relief, until this morning she woke up down 5lbs. Pt states she now feels weak. Pt wants to know if she should stop her Torsemide for a while or just go back to her every other day routine. Please advise.

## 2018-06-30 NOTE — Telephone Encounter (Signed)
Pt would like for Dr. Valentina Lucks to call her. She has questions concerning her fluids.

## 2018-06-30 NOTE — Telephone Encounter (Signed)
She should stop taking Torsemide for now and make an appointment to be seen in our clinic at her earliest convenience next week.  Thanks.

## 2018-07-03 NOTE — Telephone Encounter (Signed)
Contacted pt to give her the below message and she stated that she had already spoken with Dr. Valentina Lucks about this when he called her this morning.  Did see where he spoke with her and routed that message to PCP. Told pt that she would just follow what he said and if she needed [place] she could call us.Katharina Caper, April D, [place]

## 2018-07-03 NOTE — Telephone Encounter (Signed)
Follow-up of call from 7/12 (Friday).  States her leg swelling is minimal at this point.  Walking is consistent.  States her weight was 189 last Friday when she felt poorly.  She reported her weight is back to her usual 192-194 range today. Discussed avoiding taking torsemide if weight was less than 190. She agreed that dehydration was likely cause of "feeling bad"  Blood Pressure 138/66 this AM which is near her target range. Plans to see Dr. Harrington Challenger in August.    Reports only 1 low blood sugar recently which was managed effectively with orange juice.    Patient advised to follow up with new PCP - Dr. Shan Levans in the near future.  I am happy to continue working with this patient as needed.

## 2018-07-10 ENCOUNTER — Other Ambulatory Visit: Payer: Self-pay

## 2018-07-10 DIAGNOSIS — I10 Essential (primary) hypertension: Secondary | ICD-10-CM

## 2018-07-11 MED ORDER — AMLODIPINE BESYLATE 10 MG PO TABS: 10.0000 mg | ORAL_TABLET | Freq: Every day | ORAL | 1 refills | Status: DC

## 2018-07-11 MED ORDER — SPIRONOLACTONE 25 MG PO TABS: 25.0000 mg | ORAL_TABLET | Freq: Every day | ORAL | 2 refills | Status: DC

## 2018-07-12 DIAGNOSIS — H43813 Vitreous degeneration, bilateral: Secondary | ICD-10-CM | POA: Diagnosis not present

## 2018-07-12 DIAGNOSIS — H33102 Unspecified retinoschisis, left eye: Secondary | ICD-10-CM | POA: Diagnosis not present

## 2018-07-12 DIAGNOSIS — E119 Type 2 diabetes mellitus without complications: Secondary | ICD-10-CM | POA: Diagnosis not present

## 2018-07-12 DIAGNOSIS — H35411 Lattice degeneration of retina, right eye: Secondary | ICD-10-CM | POA: Diagnosis not present

## 2018-07-17 ENCOUNTER — Other Ambulatory Visit: Payer: Self-pay | Admitting: Family Medicine

## 2018-07-17 DIAGNOSIS — G4733 Obstructive sleep apnea (adult) (pediatric): Secondary | ICD-10-CM | POA: Diagnosis not present

## 2018-07-17 DIAGNOSIS — H04123 Dry eye syndrome of bilateral lacrimal glands: Secondary | ICD-10-CM | POA: Diagnosis not present

## 2018-07-17 DIAGNOSIS — E119 Type 2 diabetes mellitus without complications: Secondary | ICD-10-CM | POA: Diagnosis not present

## 2018-07-21 DIAGNOSIS — G4733 Obstructive sleep apnea (adult) (pediatric): Secondary | ICD-10-CM | POA: Diagnosis not present

## 2018-07-24 ENCOUNTER — Telehealth: Payer: Self-pay | Admitting: Pharmacist

## 2018-07-24 NOTE — Telephone Encounter (Signed)
Pt would like Dr. Valentina Lucks to give her a call concerning questions she has about her medications.

## 2018-07-24 NOTE — Telephone Encounter (Signed)
Returned call to patient who reports she had weakness/heaviness in her legs following her walk this morning.  She reports this is the first time this has happened in several weeks.   She checked her blood sugar which was > 130 and her blood pressure which was normal.    She denies weight change.   She expressed concern and questioned if this could be due to her refresh eye drops or her fluticasone nasal spray.  I communicated that I did NOT think her symptoms were due to either of these.   Given resolution of symptoms she was comfortable with scheduling follow-up for evaluation with her PCP. (Has not yet met Dr. Shan Levans).  She will call and reschedule.   She was encouraged to call with any recurrent symptoms.

## 2018-07-25 ENCOUNTER — Ambulatory Visit: Payer: Medicare Other | Admitting: Family Medicine

## 2018-08-01 ENCOUNTER — Other Ambulatory Visit: Payer: Self-pay

## 2018-08-01 DIAGNOSIS — I5032 Chronic diastolic (congestive) heart failure: Principal | ICD-10-CM

## 2018-08-01 DIAGNOSIS — I110 Hypertensive heart disease with heart failure: Secondary | ICD-10-CM

## 2018-08-01 MED ORDER — CARVEDILOL 3.125 MG PO TABS: 3.1250 mg | ORAL_TABLET | Freq: Two times a day (BID) | ORAL | 3 refills | Status: DC

## 2018-08-03 ENCOUNTER — Encounter: Payer: Self-pay | Admitting: Internal Medicine

## 2018-08-08 ENCOUNTER — Other Ambulatory Visit: Payer: Self-pay | Admitting: Family Medicine

## 2018-08-09 ENCOUNTER — Ambulatory Visit: Payer: Medicare Other | Admitting: Podiatry

## 2018-08-09 ENCOUNTER — Encounter: Payer: Self-pay | Admitting: Podiatry

## 2018-08-09 DIAGNOSIS — M79674 Pain in right toe(s): Secondary | ICD-10-CM

## 2018-08-09 DIAGNOSIS — M79675 Pain in left toe(s): Secondary | ICD-10-CM

## 2018-08-09 DIAGNOSIS — B351 Tinea unguium: Secondary | ICD-10-CM

## 2018-08-09 NOTE — Progress Notes (Signed)
Subjective:   Patient ID: Rebecca Walter, female   DOB: 77 y.o.   MRN: [id number]   HPI Patient presents with nail disease 1-5 both feet that are thick and she cannot cut and they become painful   ROS      Objective:  Physical Exam  Neurovascular status unchanged with thick yellow brittle nailbeds 1-5 both feet that are painful and impossible for her to cut     Assessment:  Symptomatic mycotic toenail infection with pain 1-5 both feet     Plan:  Debride painful nailbeds 1-5 both feet with no iatrogenic bleeding noted

## 2018-08-16 DIAGNOSIS — H04123 Dry eye syndrome of bilateral lacrimal glands: Secondary | ICD-10-CM | POA: Diagnosis not present

## 2018-08-17 DIAGNOSIS — G4733 Obstructive sleep apnea (adult) (pediatric): Secondary | ICD-10-CM | POA: Diagnosis not present

## 2018-08-18 ENCOUNTER — Telehealth: Payer: Self-pay | Admitting: *Deleted

## 2018-08-18 DIAGNOSIS — G4700 Insomnia, unspecified: Secondary | ICD-10-CM

## 2018-08-18 NOTE — Telephone Encounter (Signed)
get a 2 week autotitration from 6 to 12cm H2O faxed to [facility]

## 2018-08-18 NOTE — Telephone Encounter (Signed)
-----   Message from Sueanne Margarita, MD sent at 08/18/2018  9:03 AM EDT ----- Regarding: RE: cpap pressure Please get a 2 week autotitration from 6 to [place] ----- Message ----- From: Freada Bergeron, CMA Sent: 08/18/2018   8:44 AM EDT To: Sueanne Margarita, MD Subject: cpap pressure                                  Patient states her pressure is too high per [facility] causing dry eyes. Please write order to decrease pressure from 11cm. Thanks, Gae Bon

## 2018-08-22 ENCOUNTER — Other Ambulatory Visit: Payer: Self-pay

## 2018-08-22 MED ORDER — METFORMIN HCL ER 500 MG PO TB24: ORAL_TABLET | ORAL | 3 refills | Status: DC

## 2018-08-25 ENCOUNTER — Telehealth: Payer: Self-pay | Admitting: Pharmacist

## 2018-08-25 NOTE — Telephone Encounter (Signed)
Pt would like Dr. Valentina Lucks to call her. She said she was feeling weak this morning. She took checked her blood pressure and sugar and all her numbers are okay. She wants to discuss how she is feeling with Dr. Valentina Lucks.

## 2018-08-28 ENCOUNTER — Ambulatory Visit: Payer: Medicare Other | Admitting: Internal Medicine

## 2018-08-28 NOTE — Telephone Encounter (Signed)
Patient reported using CPAP which has caused corneal orientation (dryness).   She wondered if the eye drops were causing the lightheadedness and symptoms she experienced 3 days ago.  She is doing well otherwise.   She reports her vision remains compromised.  We discussed need for adherence to eye drops.   She plans to continue with current treatment plan (using eye drops).  She reports Blood Pressure has been well controlled.   Taking her loop diuretic most days.   She reports her sleep, breathing and congestion are all improved with use of CPAP.   Encouraged continued use.   Encouraged to schedule visit in coming weeks with PCP, [name] in the upcoming weeks.

## 2018-08-29 ENCOUNTER — Ambulatory Visit: Payer: Medicare Other | Admitting: Internal Medicine

## 2018-08-29 ENCOUNTER — Other Ambulatory Visit: Payer: Self-pay | Admitting: Family Medicine

## 2018-08-31 ENCOUNTER — Other Ambulatory Visit: Payer: Self-pay

## 2018-08-31 DIAGNOSIS — I10 Essential (primary) hypertension: Secondary | ICD-10-CM

## 2018-08-31 MED ORDER — AMLODIPINE BESYLATE 10 MG PO TABS: 10.0000 mg | ORAL_TABLET | Freq: Every day | ORAL | 1 refills | Status: DC

## 2018-09-06 ENCOUNTER — Telehealth: Payer: Self-pay | Admitting: Pharmacist

## 2018-09-06 NOTE — Telephone Encounter (Signed)
Pt would like for Dr. Valentina Lucks to call her to discuss her medications and her blood pressure being high.

## 2018-09-06 NOTE — Telephone Encounter (Signed)
Call in follow-up of elevated blood pressure.  Patient reports anxiety was worsened last week.   Anxiety was worsened on Saturday when Blood Pressure appeared elevated.   Return visit tomorrow scheduled.  Asked to bring all medications.

## 2018-09-07 ENCOUNTER — Ambulatory Visit: Payer: Medicare Other | Admitting: Pharmacist

## 2018-09-07 ENCOUNTER — Encounter: Payer: Self-pay | Admitting: Pharmacist

## 2018-09-07 VITALS — BP 162/64 | HR 69 | Ht <= 58 in | Wt 199.2 lb

## 2018-09-07 DIAGNOSIS — Z23 Encounter for immunization: Secondary | ICD-10-CM | POA: Diagnosis not present

## 2018-09-07 DIAGNOSIS — F419 Anxiety disorder, unspecified: Secondary | ICD-10-CM

## 2018-09-07 DIAGNOSIS — I5032 Chronic diastolic (congestive) heart failure: Secondary | ICD-10-CM

## 2018-09-07 DIAGNOSIS — I110 Hypertensive heart disease with heart failure: Secondary | ICD-10-CM

## 2018-09-07 MED ORDER — BUSPIRONE HCL 10 MG PO TABS: 10.0000 mg | ORAL_TABLET | Freq: Two times a day (BID) | ORAL | 2 refills | Status: DC

## 2018-09-07 NOTE — Patient Instructions (Addendum)
It was great to see you today!   We are going to make one change today:  1) Increase buspirone to 10 mg twice daily.    In two weeks, if your blood pressure top number is still in the 160s, please give [place] a call and we can follow up sooner. We recommend checking your blood pressure twice weekly, or when you are feeling poorly.  Schedule an appointment with Dr. Shan Levans to meet her.

## 2018-09-07 NOTE — Progress Notes (Signed)
S:     Chief Complaint  Patient presents with  . Medication Management    Hypertension    Patient arrives in good spirits, ambulating without assistance and with a cane.  Presents for hypertension and diabetes evaluation, education, and management at the request of Dr. Juleen China (referred on 02/06/2018). Patient was last seen by Primary Care Provider on 05/16/2018.  She has not yet met her newly assigned PCP - Dr. Shan Levans.  She contacted clinic yesterday with complaints of elevated blood pressure. She noted that her anxiety worsened when her blood pressure was elevated.   She presents today and states that her blood pressure and anxiety have been worse in the past few weeks. She notes that she worries when she sees elevated blood pressure readings and will recheck multiple times that day. She occasionally takes an extra amlodipine 2.5-5 mg when BP is elevated. She reports that she doesn't believe her BP medications to be working "like they used to", which is worrisome to her. She recently saw ophthalmology for dry eye, and was prescribed Restasis. She associated her increases in BP with the addition of this medication, and has discontinued Restasis for the past week. She also discontinued fluticasone, as she associated this with elevated BP as well.    Patient reports adherence with medications.  Current hypertension medications include: amlodipine 10 mg QAM, candesartan 32 mg QPM, carvedilol 3.125 mg BID, spironolactone 25 mg QAM and torsemide 20 mg every other morning She denies s/sx orthostasis  O:  Physical Exam  Constitutional: She appears well-developed and well-nourished.  Vitals reviewed.   Review of Systems  Psychiatric/Behavioral: The patient is nervous/anxious.   All other systems reviewed and are negative.    Lab Results  Component Value Date   HGBA1C 7.0 05/16/2018   Vitals:   09/07/18 1456  BP: (!) 162/64  Pulse: 69  SpO2: 98%   BMP Latest Ref Rng & Units  04/08/2018 03/16/2018 03/02/2018  Glucose 65 - 99 mg/dL 201(H) 128(H) 132(H)  BUN 6 - 20 mg/dL 28(H) 29(H) 29(H)  Creatinine 0.44 - 1.00 mg/dL 0.87 0.92 0.97  BUN/Creat Ratio 12 - 28 - 32(H) 30(H)  Sodium 135 - 145 mmol/L 137 140 140  Potassium 3.5 - 5.1 mmol/L 4.3 4.9 4.9  Chloride 101 - 111 mmol/L 104 102 102  CO2 22 - 32 mmol/L _0 Calcium 8.9 - 10.3 mg/dL 9.8 10.2 10.0    Lipid Panel     Component Value Date/Time   CHOL 171 07/16/2017 0402   TRIG 249 (H) 07/16/2017 0402   HDL 41 07/16/2017 0402   CHOLHDL 4.2 07/16/2017 0402   VLDL 50 (H) 07/16/2017 0402   LDLCALC 80 07/16/2017 0402   LDLDIRECT 103 (H) 03/11/2010 2127    Home BP: 035-597C systolic, diastolic 16-38G  Home BP cuff today in clinic: 172/62  Clinical ASCVD: No  ASCVD risk factors : age 22-75  A/P: Anxiety longstanding, currently uncontrolled; patient more concerned when BP elevated. Improving underlying anxiety may have a beneficial effect on BP control - Discussed with Dr. Juleen China; increase buspirone from 7.5 mg to 10 mg twice daily - Encouraged patient to f/u with new PCP Dr. Shan Levans regarding this concern - Moving forward, may consider initiating low-dose SSRI for additional anxiety control  Hypertension longstanding currently uncontrolled.  BP goal = 140/90 mmHg, though a more relaxed goal may be appropriate given age, fall risk. Patient is adherent with medication. Control is suboptimal due to underlying anxiety.  Wide pulse pressure with diastolic reading 81-44 continues to be challening for management.  We discussed the risks of syncope/hypotension including falls. No changes to antihypertensive regimen today.  - Continued amlodipine 10 mg, candesartan 32 mg, carvedilol 3.125 mg BID, spironolactone 25 mg, and torsemide 20 mg QOD.  - Encouraged patient to limit home BP checks to only a few times a week to minimize increased anxiety related to elevated readings - Consider additional pharmacotherapy,  including a low dose thiazide diuretic, alpha-blocker or hydralazine  - Counseled patient to contact clinic if BP continues to be significantly elevated for further work up/treatment   Written patient instructions provided.  Total time in face to face counseling 45 minutes.   Follow up PCP Clinic Visit ASAP, Pharmacy visit as needed. Patient seen with Catie Darnelle Maffucci, PharmD, PGY2 Pharmacy Resident

## 2018-09-08 ENCOUNTER — Other Ambulatory Visit: Payer: Self-pay

## 2018-09-08 ENCOUNTER — Encounter: Payer: Self-pay | Admitting: Family Medicine

## 2018-09-08 ENCOUNTER — Ambulatory Visit: Payer: Medicare Other | Admitting: Family Medicine

## 2018-09-08 VITALS — BP 136/72 | HR 77 | Temp 98.4°F | Wt 198.0 lb

## 2018-09-08 DIAGNOSIS — J069 Acute upper respiratory infection, unspecified: Secondary | ICD-10-CM | POA: Diagnosis not present

## 2018-09-08 NOTE — Assessment & Plan Note (Signed)
Anxiety longstanding, currently uncontrolled; patient more concerned when BP elevated. Improving underlying anxiety may have a beneficial effect on BP control - Discussed with Dr. Juleen China; increase buspirone from 7.5 mg to 10 mg twice daily - Encouraged patient to f/u with new PCP Dr. Shan Levans regarding this concern - Moving forward, may consider initiating low-dose SSRI for additional anxiety control

## 2018-09-08 NOTE — Assessment & Plan Note (Signed)
Hypertension longstanding currently uncontrolled.  BP goal = 140/90 mmHg, though a more relaxed goal may be appropriate given age, fall risk. Patient is adherent with medication. Control is suboptimal due to underlying anxiety. Wide pulse pressure with diastolic reading 85-69 continues to be challening for management.  We discussed the risks of syncope/hypotension including falls. No changes to antihypertensive regimen today.  - Continued amlodipine 10 mg, candesartan 32 mg, carvedilol 3.125 mg BID, spironolactone 25 mg, and torsemide 20 mg QOD.  - Encouraged patient to limit home BP checks to only a few times a week to minimize increased anxiety related to elevated readings - Consider additional pharmacotherapy, including a low dose thiazide diuretic, alpha-blocker or hydralazine  - Counseled patient to contact clinic if BP continues to be significantly elevated for further work up/treatment

## 2018-09-08 NOTE — Patient Instructions (Signed)
It was great to see you!  You have a cold and it should start to get better about 7 - 10 days after it started.    For your nasal congestion and runny nose, try using Afrin (generic is Oxymetazoline) twice daily for 3 days.  Do not use for longer that 3 days.    Some other therapies you can try are: push fluids, rest, gargle warm salt water and return office visit prn if symptoms persist or worsen.   Drinking warm liquids such as teas and soups can help with secretions and cough. A mist humidifier or vaporizer can work well to help with secretions and cough.  It is very important to clean the humidifier between use according to the instructions.    It was good to see you.  If you're still having trouble in the next week, come back and see [place].    Of course, if you start having trouble breathing, worsening fevers, vomiting and unable to hold down any fluids, or you have other concerns, don't hesitate to come back or go to the ED after hours.   Take care and seek immediate care sooner if you develop any concerns.   Dr. [facility]

## 2018-09-08 NOTE — Assessment & Plan Note (Addendum)
Symptoms most consistent with viral URI.  Less likely pneumonia given no hypoxia, fevers, sputum production.  Less likely CHF exacerbation given no signs of volume overload on exam and no missed doses of medication.  Advised continued supportive care with current antihistamine and warm liquids and rest.  Advised to return to clinic if no better after 7-10 days total.

## 2018-09-08 NOTE — Progress Notes (Signed)
  Subjective:   Patient ID: Rebecca Walter    DOB: [birth year], 77 y.o. female   MRN: [id number]  Rebecca Walter is a 77 y.o. female with a history of CHf, GERD, DM2, OA, obesity, anxiety here for   SHORTNESS OF BREATH Got flu shot yesterday. Hasn't gotten shingles vaccine. Has been short of breath for 6 days. Short of breath with: exertion from kitchen to bedroom. Typically will get SOB with exertion but more severe since Tuesday. New Medications: no, recetnly increased buspar dose. Stopped flonase a few days ago.  Denies missed doses of any medications, especially torsemide. Patient believes may be caused by stopping her Flonase a few days ago. Sick contacts: no  Kidney problems: no Heart problems: CHF, hypertension History of cancer: skin cancer  Symptoms Fever: has "run hot," hasn't taken temperature Has had some chest congestion, denies cough. Sputum: no Wheezing or asthma: no Leg swelling: no Chest Pain: no, having some back pain Immobility: no Stomach pain or black bowel movements: no Severe snoring or daytime sleepiness: has CPAP Weight loss: no  Review of Systems:  Per HPI.  [place], medications and smoking status reviewed.  Objective:   BP 136/72   Pulse 77   Temp 98.4 F (36.9 C) (Oral)   Wt 198 lb (89.8 kg)   LMP  (LMP Unknown)   SpO2 96%   BMI 42.85 kg/m  Vitals and nursing note reviewed.  General: Obese female, in no acute distress with non-toxic appearance HEENT: normocephalic, atraumatic, moist mucous membranes.  Slightly erythematous throat without exudate. Neck: supple, non-tender without lymphadenopathy CV: regular rate and rhythm without murmurs, rubs, or gallops, trace lower extremity edema underneath compression stockings. Lungs: clear to auscultation bilaterally with normal work of breathing.  Appropriate oxygen saturations on room air.  Normal work of breathing on room air. Abdomen: soft, non-tender, non-distended, no masses or organomegaly  palpable, normoactive bowel sounds Skin: warm, dry, no rashes or lesions Extremities: warm and well perfused, normal tone Neuro: Alert and oriented, speech normal  Assessment & Plan:   Viral URI Symptoms most consistent with viral URI.  Less likely pneumonia given no hypoxia, fevers, sputum production.  Less likely CHF exacerbation given no signs of volume overload on exam and no missed doses of medication.  Advised continued supportive care with current antihistamine and warm liquids and rest.  Advised to return to clinic if no better after 7-10 days total.  No orders of the defined types were placed in this encounter.  No orders of the defined types were placed in this encounter.   Rory Percy, DO PGY-2, [facility] 09/08/2018 4:57 PM

## 2018-09-08 NOTE — Progress Notes (Signed)
Patient ID: Rebecca Walter, female   DOB: 01/27/1941, 77 y.o.   MRN: [id number] Reviewed: Agree with Dr. Koval's documentation and management. 

## 2018-09-12 ENCOUNTER — Ambulatory Visit (HOSPITAL_COMMUNITY)
Admission: EM | Admit: 2018-09-12 | Discharge: 2018-09-12 | Disposition: A | Discharge status: Home / Self Care | Payer: Medicare Other | Attending: Family Medicine | Admitting: Family Medicine

## 2018-09-12 ENCOUNTER — Encounter (HOSPITAL_COMMUNITY): Payer: Self-pay | Admitting: Family Medicine

## 2018-09-12 ENCOUNTER — Telehealth: Payer: Self-pay | Admitting: Family Medicine

## 2018-09-12 DIAGNOSIS — H8112 Benign paroxysmal vertigo, left ear: Secondary | ICD-10-CM | POA: Diagnosis not present

## 2018-09-12 MED ORDER — MECLIZINE HCL 12.5 MG PO TABS: 12.5000 mg | ORAL_TABLET | Freq: Three times a day (TID) | ORAL | 0 refills | Status: DC | PRN

## 2018-09-12 NOTE — Discharge Instructions (Signed)
If you develop new or progressively worsening symptoms, go to the emergency room.  Follow-up with your primary care doctor as planned.

## 2018-09-12 NOTE — Telephone Encounter (Signed)
Pt would like Dr. Valentina Lucks to call to discuss her blood pressure. It has been running high since last night. The best call back number is [phone].

## 2018-09-12 NOTE — ED Provider Notes (Signed)
[facility]    CSN: [id number] Arrival date & time: 09/12/18  1114     History   Chief Complaint Chief Complaint  Patient presents with  . Headache    HPI Rebecca Walter is a 77 y.o. female.   This 77 year old woman developed some mild disequilibrium last night along with some paresthesias in her left occipital region.  She has had vertigo in the past and this feels similar.  She says her symptoms get worse when she turns her head to the left.  Significant negatives: Head trauma, fever, hearing loss, ear pain, tinnitus  Past medical history is significant for anxiety.  Patient was seen by her primary care doctor last week and her BuSpar dose was increased from 7.5 to 10 mg twice daily.  Until last night she has had no new problems.  Patient walks with a cane.  She is accompanied by her son today.     Past Medical History:  Diagnosis Date  . Anxiety   . Arthritis   . Chronic diastolic CHF (congestive heart failure) ([place])   . Diabetes mellitus   . GERD (gastroesophageal reflux disease)   . Gout   . Hyperlipidemia   . Hypertension   . Hypertensive heart disease with CHF (congestive heart failure) ([place])   . Mild aortic stenosis 06/2017  . Mild mitral regurgitation 06/2017  . Moderate pulmonary valve insufficiency 06/2017  . Osteopenia     Patient Active Problem List   Diagnosis Date Noted  . Elevated TSH 11/21/2017  . Hypertensive heart disease with CHF (congestive heart failure) ([place])   . Chronic diastolic CHF (congestive heart failure) ([place]) 07/15/2017  . Mild aortic stenosis 06/19/2017  . Mild mitral regurgitation 06/19/2017  . Moderate pulmonary valve insufficiency 06/19/2017  . Lumbar back pain with radiculopathy affecting right lower extremity 10/29/2016  . Viral URI 02/19/2013  . Allergic rhinitis 03/22/2011  . INSOMNIA 09/16/2009  . GASTROPARESIS 08/12/2009  . Anxiety 10/18/2008  . DYSKINESIA, ESOPHAGUS 09/13/2007  . CARCINOMA, BASAL CELL  05/26/2007  . Diabetes mellitus, type II ([place]) 02/16/2007  . HLD (hyperlipidemia) 02/16/2007  . OBESITY, NOS 02/16/2007  . GASTROESOPHAGEAL REFLUX, NO ESOPHAGITIS 02/16/2007  . OSTEOARTHRITIS, MULTI SITES 02/16/2007  . Osteopenia after menopause 02/16/2007  . Gout 02/16/2007    Past Surgical History:  Procedure Laterality Date  . KIDNEY SURGERY    . KNEE SURGERY    . MENISECTOMY    . WRIST SURGERY      OB History   None      Home Medications    Prior to Admission medications   Medication Sig Start Date End Date Taking? Authorizing Provider  acetaminophen (TYLENOL) 325 MG tablet Take 325 mg by mouth every 8 (eight) hours as needed (pain).     [provider]  allopurinol (ZYLOPRIM) 100 MG tablet TAKE 2 TABLETS BY MOUTH EVERY DAY 02/21/18   Nicolette Bang, DO  alum & mag hydroxide-simeth (MAALOX/MYLANTA) 200-200-20 MG/5ML suspension Take 30 mLs by mouth every 6 (six) hours as needed for indigestion or heartburn. 07/20/17   Nicolette Bang, DO  amLODipine (NORVASC) 10 MG tablet Take 1 tablet (10 mg total) by mouth daily. 08/31/18   Nuala Alpha, DO  aspirin 81 MG chewable tablet Chew 1 tablet (81 mg total) by mouth daily. 03/22/11   Cletus Gash, MD  B-D ULTRAFINE III SHORT PEN 31G X 8 MM MISC USE AS DIRECTED 06/06/18   Nicolette Bang, DO  Blood Glucose Monitoring  Suppl (ACCU-CHEK AVIVA) device Use as instructed 06/15/18 06/15/19  Nicolette Bang, DO  busPIRone (BUSPAR) 10 MG tablet Take 1 tablet (10 mg total) by mouth 2 (two) times daily. 09/07/18   Zenia Resides, MD  Camphor-Eucalyptus-Menthol (VICKS VAPORUB EX) Place 1 application into both nostrils daily as needed (congestion).    [provider]  candesartan (ATACAND) 32 MG tablet TAKE 1 TABLET (32 MG TOTAL) BY MOUTH AT BEDTIME 06/05/18   Nicolette Bang, DO  carvedilol (COREG) 3.125 MG tablet Take 1 tablet (3.125 mg total) by mouth 2 (two) times daily  with a meal. 08/01/18   [name], Alcario Drought, MD  fluticasone (FLONASE) 50 MCG/ACT nasal spray Place 2 sprays into both nostrils daily. 04/21/18   Nicolette Bang, DO  glucose blood (ACCU-CHEK AVIVA PLUS) test strip CHECK BLOOD SUGAR FIRST  THING IN THE MORNING BEFORE EATING, AFTER LUNCH, AND  AFTER DINNER TOTAL 3 TIMES  DAILY 06/14/18   Nicolette Bang, DO  insulin aspart (NOVOLOG FLEXPEN) 100 UNIT/ML FlexPen Inject 20 units into the skin three times daily before meals Patient taking differently: Inject 9-15 Units into the skin See admin instructions. Inject 15 units subcutaneously daily with breakfast, inject 9 units with lunch as needed for CBG 180 or more, and inject 9 units with supper 11/24/17   Nicolette Bang, DO  Insulin Glargine (LANTUS SOLOSTAR) 100 UNIT/ML Solostar Pen INJECT 70 UNITS INTO THE SKIN EVERY MORNING. 08/29/18   Lockamy, Timothy, DO  loratadine (CLARITIN) 10 MG tablet Take 10 mg by mouth daily.    [provider]  lovastatin (MEVACOR) 40 MG tablet Take 1 tablet (40 mg total) by mouth daily with supper. 06/30/18   Kathrene Alu, MD  meclizine (ANTIVERT) 12.5 MG tablet Take 1 tablet (12.5 mg total) by mouth 3 (three) times daily as needed for dizziness. 09/12/18   Robyn Haber, MD  metFORMIN (GLUCOPHAGE-XR) 500 MG 24 hr tablet TAKE 1 TABLET TWICE A DAY BEFORE MEALS 08/22/18   [name], Alcario Drought, MD  metoCLOPramide (REGLAN) 5 MG tablet TAKE 1 TABLET 30 MIN BEFORE MEALS AND 1 TABLET AT BEDTIME. FOUR TIMES/DAY 09/04/17   [provider]  Multiple Vitamin (MULTIVITAMIN WITH MINERALS) TABS tablet Take 1 tablet by mouth daily.    [provider]  OVER THE COUNTER MEDICATION Place 1 drop into both eyes daily. Over the counter preservative free lubricating eye drops    [provider]  pantoprazole (PROTONIX) 40 MG tablet Take 40 mg by mouth 2 (two) times daily.     [provider]  Probiotic Product (PROBIOTIC PEARLS)  CAPS Take 1 capsule by mouth daily with lunch.     [provider]  ranitidine (ZANTAC) 150 MG tablet Take 150 mg by mouth daily as needed for heartburn (acid reflux).     [provider]  sodium chloride (OCEAN) 0.65 % SOLN nasal spray Place 1 spray into both nostrils daily.    [provider]  spironolactone (ALDACTONE) 25 MG tablet Take 1 tablet (25 mg total) by mouth daily. 07/11/18   Kathrene Alu, MD  torsemide (DEMADEX) 20 MG tablet Take 1 tablet (20 mg total) by mouth every other day. 02/23/18   Leavy Cella, RPH  torsemide (DEMADEX) 20 MG tablet TAKE 1 TABLET BY MOUTH EVERY DAY 06/05/18   Nicolette Bang, DO  vitamin E 400 UNIT capsule Take 400 Units by mouth daily.    [provider]    [facility]  History Family History  Problem Relation Age of Onset  . Stroke Mother   . Heart disease Father   . Stroke Father   . Hypertension Father   . Cancer Sister   . Cancer Brother   . Heart disease Brother   . Diabetes Maternal Uncle     Social History Social History   Tobacco Use  . Smoking status: Never Smoker  . Smokeless tobacco: Never Used  Substance Use Topics  . Alcohol use: No  . Drug use: No     Allergies   Baclofen; Hctz [hydrochlorothiazide]; Lotensin [benazepril]; Victoza [liraglutide]; Acyclovir and related; Beta adrenergic blockers; and Losartan   Review of Systems Review of Systems  Musculoskeletal: Positive for gait problem and neck pain.  Neurological: Positive for light-headedness. Negative for headaches.  All other systems reviewed and are negative.    Physical Exam Triage Vital Signs ED Triage Vitals [09/12/18 1135]  Enc Vitals Group     BP (!) 160/59     Pulse Rate 74     Resp 18     Temp 98.3 F (36.8 C)     Temp Source Oral     SpO2 96 %     Weight      Height      Head Circumference      Peak Flow      Pain Score      Pain Loc      Pain Edu?      Excl. in [place]?    Orthostatic VS for the  past 24 hrs:  BP- Lying Pulse- Lying BP- Sitting Pulse- Sitting BP- Standing at 0 minutes Pulse- Standing at 0 minutes  09/12/18 1145 140/52 65 144/55 63 160/61 77    Updated Vital Signs BP (!) 160/59 (BP Location: Left Arm)   Pulse 74   Temp 98.3 F (36.8 C) (Oral)   Resp 18   LMP  (LMP Unknown)   SpO2 96%    Physical Exam  Constitutional: She is oriented to person, place, and time. She appears well-developed and well-nourished.  HENT:  Head: Normocephalic.  Left TM is slightly retracted with thickening and opacity  Eyes: Pupils are equal, round, and reactive to light. EOM are normal.  Neck: Normal range of motion. Neck supple.  Cardiovascular: Normal rate and normal heart sounds.  Pulmonary/Chest: Effort normal and breath sounds normal.  Neurological: She is alert and oriented to person, place, and time. She has normal strength. She is not disoriented. No cranial nerve deficit or sensory deficit.  Skin: Skin is warm.  Psychiatric: She has a normal mood and affect. Her behavior is normal.  Nursing note and vitals reviewed.    UC Treatments / Results  Labs (all labs ordered are listed, but only abnormal results are displayed) Labs Reviewed - No data to display  EKG None  Radiology No results found.  Procedures Procedures (including critical care time)  Medications Ordered in UC Medications - No data to display  Initial Impression / Assessment and Plan / UC Course  I have reviewed the triage vital signs and the nursing notes.  Pertinent labs & imaging results that were available during my care of the patient were reviewed by me and considered in my medical decision making (see chart for details).    Final Clinical Impressions(s) / UC Diagnoses   Final diagnoses:  Benign paroxysmal positional vertigo of left ear     Discharge Instructions     If you develop new or  progressively worsening symptoms, go to the emergency room.  Follow-up with your primary  care doctor as planned.    ED Prescriptions    Medication Sig Dispense Auth. Provider   meclizine (ANTIVERT) 12.5 MG tablet Take 1 tablet (12.5 mg total) by mouth 3 (three) times daily as needed for dizziness. 30 tablet Robyn Haber, MD     Controlled Substance Prescriptions [place] Controlled Substance Registry consulted? Not Applicable   Robyn Haber, MD 09/12/18 1209

## 2018-09-12 NOTE — ED Triage Notes (Addendum)
Pt here for left sided head pain and feeling off balance starting last night; pt sts increased anxiety medication 1 week ago

## 2018-09-12 NOTE — Telephone Encounter (Signed)
Called Ms. Casagrande to discuss her headache and ataxia.  She says that both of her legs feel weak and she has a headache, which correspond to symptoms she usually has with high blood pressure.  She says that she took an extra 5 mg of amlodipine last night for her symptoms, but she still had a BP of 156/90 this morning, which makes her very concerned.  She denies any speech changes, one sided weakness, or asymmetry.  I told her that since I couldn't assess her in person, it may be best that she go to the Emergency Dept or at least to Urgent Care to be assessed for a possible stroke.  However, she said that she would have to wait a long time, and that she could not afford for the ambulance to take her there.  I then advised her to call our clinic to see if she could be worked in for a physician or nurse visit to check her blood pressure and other vitals to see if her blood pressure medicine needed to be adjusted.  She was amenable to this plan and will try to call the clinic for an appointment.

## 2018-09-12 NOTE — Telephone Encounter (Signed)
Phone call from patient reporting head pain - left side of head - ataxic gate described.   Checked blood pressure and it was reported to be systolic 190.   Reports taking anxiety medication.   Told I would talk with PCP and we would return her call.  Discussed with PCP - [name].

## 2018-09-14 ENCOUNTER — Telehealth: Payer: Self-pay | Admitting: Family Medicine

## 2018-09-14 NOTE — Telephone Encounter (Signed)
Pt was seen in at Urgent Care for her blood pressure and she is still having issues with her pressure being high and she is still not feeling well. She would like for Dr. Valentina Lucks to call her to discuss.

## 2018-09-14 NOTE — Telephone Encounter (Signed)
called in f/u of continued complaints.  States she did go to urgent care and was dx with vertigo.  She admits it couldbe in her ears.  Asked to come in on Monday for work-in visit.  I will reassess BP at that time.  Also reports her anxiety is less with high dose of buspar.   Asked to do an add on visit for Monday - (5 days in the future).

## 2018-09-17 DIAGNOSIS — G4733 Obstructive sleep apnea (adult) (pediatric): Secondary | ICD-10-CM | POA: Diagnosis not present

## 2018-09-18 ENCOUNTER — Encounter: Payer: Self-pay | Admitting: Pharmacist

## 2018-09-18 ENCOUNTER — Ambulatory Visit: Payer: Medicare Other | Admitting: Pharmacist

## 2018-09-18 DIAGNOSIS — I110 Hypertensive heart disease with heart failure: Secondary | ICD-10-CM | POA: Diagnosis not present

## 2018-09-18 DIAGNOSIS — I5032 Chronic diastolic (congestive) heart failure: Secondary | ICD-10-CM

## 2018-09-18 DIAGNOSIS — J309 Allergic rhinitis, unspecified: Secondary | ICD-10-CM

## 2018-09-18 MED ORDER — AZELASTINE HCL 0.1 % NA SOLN: 1.0000 | Freq: Two times a day (BID) | NASAL | 3 refills | Status: DC

## 2018-09-18 NOTE — Assessment & Plan Note (Signed)
Hypertension longstanding currently at goal in clinic today.  BP goal = 140/90 mmHg, though a more relaxed goal may be appropriate given age, fall risk. Patient is adherent with medication. Control is suboptimal due to underlying anxiety. Wide pulse pressure with diastolic reading 25-18 continues to be challening for management.  We discussed the risks of syncope/hypotension including falls. No changes to antihypertensive regimen today.  - Continued amlodipine 10 mg, candesartan 32 mg, carvedilol 3.125 mg BID, spironolactone 25 mg, and torsemide 20 mg QOD.  - Encouraged patient to limit home BP checks to only a few times a week to minimize increased anxiety related to elevated readings - Consider additional pharmacotherapy, including increasing spironolactone, or changing candesartan to valsartan  - Counseled patient to contact clinic if BP continues to be significantly elevated for further work up/treatment

## 2018-09-18 NOTE — Assessment & Plan Note (Signed)
Dizziness/Allergies currently experience ear and nasal symptoms. Discusses symptoms with Dr. Andria Frames and he examine patient's ear. Symptoms are likely from fall allergies. - start Flonase 2 sprays in each nostril daily and continue for at least one week - start azelastine 1 spray in each nostril BID   - continue saline spray or gel  Daily plust as needed to limit drying effect of medications  - follow up with Dr. Shan Levans in 4 weeks and call/ return to clinic sooner if symptoms worsen

## 2018-09-18 NOTE — Progress Notes (Signed)
S:    Patient arrives in fair spirits, walking with a cane and accompanied by her son Merry Proud.  Presents to the clinic for hypertension evaluation, counseling, and management. Patient was request of Dr. Juleen China (referred on 02/06/2018). Patient was last seen by Primary Care Provider on 05/16/2018.  She has not yet met her newly assigned PCP - Dr. Shan Levans but has an appointment scheduled at the end of October.  She contacted clinic 09/12/18 with complaints of elevated blood pressure. She noted that her anxiety worsened when her blood pressure was elevated.   Patient reports adherence with medications.  Dizziness/allergy symptoms - congestion, pressure, fullness in left ear. Reports occasional throat clearing.   Current BP Medications include:  Amlodipine 27m daily, candesartan 372m carvedilol 3.12575mspironolactone 52m67mily, torsemide 20mg38mry other day based on weight.   Antihypertensives tried in the past include: losartan (diarrhea), metoprolol (coughing), benazepril (dry cough), HCTZ (uric acid elevation and gout)   Home BP readings: reports most readings >150/90   O:  Physical Exam  Constitutional: She appears well-developed and well-nourished.  Cardiovascular: Normal rate.     Review of Systems  Neurological: Positive for dizziness (reported to urgent care 9/24 and discharged with meclizine ).    Last 3 Office BP readings: BP Readings from Last 3 Encounters:  09/12/18 (!) 160/59  09/08/18 136/72  09/07/18 (!) 162/64    BMET    Component Value Date/Time   NA 137 04/08/2018 1628   NA 140 03/16/2018 1420   K 4.3 04/08/2018 1628   CL 104 04/08/2018 1628   CO2 22 04/08/2018 1628   GLUCOSE 201 (H) 04/08/2018 1628   BUN 28 (H) 04/08/2018 1628   BUN 29 (H) 03/16/2018 1420   CREATININE 0.87 04/08/2018 1628   CREATININE 0.93 09/16/2016 1410   CALCIUM 9.8 04/08/2018 1628   GFRNONAA >60 04/08/2018 1628   GFRNONAA 70 02/06/2015 1418   GFRAA >60 04/08/2018 1628   GFRAA 81  02/06/2015 1418    Renal function: CrCl cannot be calculated (Patient's most recent lab result is older than the maximum 21 days allowed.).  A/P: Dizziness/Allergies currently experience ear and nasal symptoms. Discusses symptoms with Dr. HenseAndria Frameshe examine patient's ear. Symptoms are likely from fall allergies. - start Flonase 2 sprays in each nostril daily and continue for at least one week - start azelastine 1 spray in each nostril BID   - continue saline spray or gel  Daily plust as needed to limit drying effect of medications  - follow up with Dr. WinfrShan Levans weeks and call/ return to clinic sooner if symptoms worsen  Hypertension longstanding currently at goal in clinic today.  BP goal = 140/90 mmHg, though a more relaxed goal may be appropriate given age, fall risk. Patient is adherent with medication. Control is suboptimal due to underlying anxiety. Wide pulse pressure with diastolic reading [id number] to be challening for management.  We discussed the risks of syncope/hypotension including falls. No changes to antihypertensive regimen today.  - Continued amlodipine 10 mg, candesartan 32 mg, carvedilol 3.125 mg BID, spironolactone 25 mg, and torsemide 20 mg QOD.  - Encouraged patient to limit home BP checks to only a few times a week to minimize increased anxiety related to elevated readings - Consider additional pharmacotherapy, including increasing spironolactone, or changing candesartan to valsartan  - Counseled patient to contact clinic if BP continues to be significantly elevated for further work up/treatment   Results reviewed and written information provided.  Total time in face-to-face counseling 25 minutes.   F/U PCP Clinic Visit in 4 weeks.  Patient seen with Isaias Sakai, Pharm D PGY1 Resident.

## 2018-09-18 NOTE — Patient Instructions (Addendum)
It was great to see you today.   Start taking azelastine, inhale 1 spray in each nostril twice daily. Restart Flonase, inhale 2 sprays into both nostrils daily for one week. Use saline spray to prevent over drying.   Keep blood pressure medications the same, amlodipine 10mg  daily, candesartan 32mg  daily, carvedilol 3.125mg  twice daily, spironolactone 25mg  daily, torsemide 20mg  every other day.

## 2018-09-20 ENCOUNTER — Ambulatory Visit: Payer: Medicare Other | Admitting: Physician Assistant

## 2018-09-20 ENCOUNTER — Encounter: Payer: Self-pay | Admitting: Physician Assistant

## 2018-09-20 ENCOUNTER — Encounter

## 2018-09-20 DIAGNOSIS — R0789 Other chest pain: Secondary | ICD-10-CM

## 2018-09-20 DIAGNOSIS — R002 Palpitations: Secondary | ICD-10-CM

## 2018-09-20 DIAGNOSIS — I10 Essential (primary) hypertension: Secondary | ICD-10-CM | POA: Diagnosis not present

## 2018-09-20 DIAGNOSIS — I5032 Chronic diastolic (congestive) heart failure: Secondary | ICD-10-CM | POA: Diagnosis not present

## 2018-09-20 NOTE — Patient Instructions (Signed)
Medication Instructions:  Your physician recommends that you continue on your current medications as directed. Please refer to the Current Medication list given to you today.  Labwork: None ordered.  Testing/Procedures: None ordered.  Follow-Up: Your physician recommends that you schedule a follow-up appointment in:   3-4 weeks with our Hypertension clinic  6 months with Dr Harrington Challenger  Any Other Special Instructions Will Be Listed Below (If Applicable).     If you need a refill on your cardiac medications before your next appointment, please call your pharmacy.

## 2018-09-20 NOTE — Progress Notes (Signed)
Cardiology Office Note    Date:  09/20/2018   ID:  Rebecca Walter, DOB 09-28-1941, MRN [id number]  PCP:  Kathrene Alu, MD  Cardiologist:  Dr. Harrington Challenger  Chief Complaint: 6  Months follow up  History of Present Illness:   Rebecca Walter is a 77 y.o. female with a history of HTN, HL, DM, GERD, chronic distolic CHF and mild AI/MR, moderate PR by echo 06/2017 presents for follow up.  She stopped her carvedilol due to shortness of breath. Clonidine stopped due to intolerance. Myovue 07/2017 with small area of  mid anteroseptal and apical defect consistent with ischemia . Low risk  LVEF 67%. No further work up.   She was doing well on cardiac stand point when last seen by Dr. Harrington Challenger 01/2018.  Here today for follow up.  No specific complaints.  No exercise.  She has chronic dyspnea with exertion without chest heaviness or pressure.  Denies orthopnea, PND, lower extremity edema or melena, dizziness or blood in his stool or urine.  She has intermittent elevated blood pressure especially in the morning before taking his meds.  Did not bring her blood pressure machine or reading.  Will have evaluation in hypertensive clinic in few weeks.  She has noted few seconds of heart racing at night occurring once every few months.  Did not sound arrhythmia.   Past Medical History:  Diagnosis Date  . Anxiety   . Arthritis   . Chronic diastolic CHF (congestive heart failure) ([place])   . Diabetes mellitus   . GERD (gastroesophageal reflux disease)   . Gout   . Hyperlipidemia   . Hypertension   . Hypertensive heart disease with CHF (congestive heart failure) ([place])   . Mild aortic stenosis 06/2017  . Mild mitral regurgitation 06/2017  . Moderate pulmonary valve insufficiency 06/2017  . Osteopenia     Past Surgical History:  Procedure Laterality Date  . KIDNEY SURGERY    . KNEE SURGERY    . MENISECTOMY    . WRIST SURGERY      Current Medications: Prior to Admission medications   Medication Sig Start  Date End Date Taking? Authorizing Provider  acetaminophen (TYLENOL) 325 MG tablet Take 325 mg by mouth every 8 (eight) hours as needed (pain).     [provider]  allopurinol (ZYLOPRIM) 100 MG tablet TAKE 2 TABLETS BY MOUTH EVERY DAY 02/21/18   Nicolette Bang, DO  alum & mag hydroxide-simeth (MAALOX/MYLANTA) 200-200-20 MG/5ML suspension Take 30 mLs by mouth every 6 (six) hours as needed for indigestion or heartburn. Patient not taking: Reported on 09/18/2018 07/20/17   Nicolette Bang, DO  amLODipine (NORVASC) 10 MG tablet Take 1 tablet (10 mg total) by mouth daily. 08/31/18   Nuala Alpha, DO  aspirin 81 MG chewable tablet Chew 1 tablet (81 mg total) by mouth daily. 03/22/11   Cletus Gash, MD  azelastine (ASTELIN) 0.1 % nasal spray Place 1 spray into both nostrils 2 (two) times daily. Use in each nostril as directed 09/18/18   Zenia Resides, MD  B-D ULTRAFINE III SHORT PEN 31G X 8 MM MISC USE AS DIRECTED 06/06/18   Nicolette Bang, DO  Blood Glucose Monitoring Suppl (ACCU-CHEK AVIVA) device Use as instructed 06/15/18 06/15/19  Nicolette Bang, DO  busPIRone (BUSPAR) 10 MG tablet Take 1 tablet (10 mg total) by mouth 2 (two) times daily. 09/07/18   Zenia Resides, MD  Camphor-Eucalyptus-Menthol (VICKS VAPORUB EX) Place 1 application into  both nostrils daily as needed (congestion).    [provider]  candesartan (ATACAND) 32 MG tablet TAKE 1 TABLET (32 MG TOTAL) BY MOUTH AT BEDTIME 06/05/18   Nicolette Bang, DO  carvedilol (COREG) 3.125 MG tablet Take 1 tablet (3.125 mg total) by mouth 2 (two) times daily with a meal. 08/01/18   [name], Alcario Drought, MD  fluticasone (FLONASE) 50 MCG/ACT nasal spray Place 2 sprays into both nostrils daily. Patient not taking: Reported on 09/18/2018 04/21/18   Nicolette Bang, DO  glucose blood (ACCU-CHEK AVIVA PLUS) test strip CHECK BLOOD SUGAR FIRST  THING IN THE MORNING BEFORE EATING,  AFTER LUNCH, AND  AFTER DINNER TOTAL 3 TIMES  DAILY 06/14/18   Nicolette Bang, DO  insulin aspart (NOVOLOG FLEXPEN) 100 UNIT/ML FlexPen Inject 20 units into the skin three times daily before meals Patient taking differently: Inject 9-18 Units into the skin See admin instructions. Inject 15 units subcutaneously daily with breakfast, inject 9 units with lunch as needed for CBG 180 or more, and inject 9 units with supper 11/24/17   Nicolette Bang, DO  Insulin Glargine (LANTUS SOLOSTAR) 100 UNIT/ML Solostar Pen INJECT 70 UNITS INTO THE SKIN EVERY MORNING. 08/29/18   Lockamy, Timothy, DO  loratadine (CLARITIN) 10 MG tablet Take 10 mg by mouth daily.    [provider]  lovastatin (MEVACOR) 40 MG tablet Take 1 tablet (40 mg total) by mouth daily with supper. Patient taking differently: Take 40 mg by mouth every morning.  06/30/18   Kathrene Alu, MD  meclizine (ANTIVERT) 12.5 MG tablet Take 1 tablet (12.5 mg total) by mouth 3 (three) times daily as needed for dizziness. Patient not taking: Reported on 09/18/2018 09/12/18   Robyn Haber, MD  metFORMIN (GLUCOPHAGE-XR) 500 MG 24 hr tablet TAKE 1 TABLET TWICE A DAY BEFORE MEALS 08/22/18   [name], Alcario Drought, MD  metoCLOPramide (REGLAN) 5 MG tablet TAKE 1 TABLET 30 MIN BEFORE MEALS AND 1 TABLET AT BEDTIME. FOUR TIMES/DAY 09/04/17   [provider]  Multiple Vitamin (MULTIVITAMIN WITH MINERALS) TABS tablet Take 1 tablet by mouth daily.    [provider]  OVER THE COUNTER MEDICATION Place 1 drop into both eyes daily. Over the counter preservative free lubricating eye drops    [provider]  pantoprazole (PROTONIX) 40 MG tablet Take 40 mg by mouth 2 (two) times daily.     [provider]  Probiotic Product (PROBIOTIC PEARLS) CAPS Take 1 capsule by mouth daily with lunch.     [provider]  ranitidine (ZANTAC) 150 MG tablet Take 150 mg by mouth daily as needed for heartburn (acid reflux).      [provider]  sodium chloride (OCEAN) 0.65 % SOLN nasal spray Place 1 spray into both nostrils daily.    [provider]  spironolactone (ALDACTONE) 25 MG tablet Take 1 tablet (25 mg total) by mouth daily. 07/11/18   Kathrene Alu, MD  torsemide (DEMADEX) 20 MG tablet Take 1 tablet (20 mg total) by mouth every other day. 02/23/18   Leavy Cella, RPH  vitamin E 400 UNIT capsule Take 400 Units by mouth daily.    [provider]    Allergies:   Baclofen; Hctz [hydrochlorothiazide]; Lotensin [benazepril]; Victoza [liraglutide]; Acyclovir and related; Beta adrenergic blockers; and Losartan   Social History   Socioeconomic History  . Marital status: Widowed    Spouse name: Not on file  . Number of children: 1  .  Years of education: 47  . Highest education level: Not on file  Occupational History  . Occupation: retired-office work  Scientific laboratory technician  . Financial resource strain: Not on file  . Food insecurity:    Worry: Not on file    Inability: Not on file  . Transportation needs:    Medical: Not on file    Non-medical: Not on file  Tobacco Use  . Smoking status: Never Smoker  . Smokeless tobacco: Never Used  Substance and Sexual Activity  . Alcohol use: No  . Drug use: No  . Sexual activity: Not on file  Lifestyle  . Physical activity:    Days per week: Not on file    Minutes per session: Not on file  . Stress: Not on file  Relationships  . Social connections:    Talks on phone: Not on file    Gets together: Not on file    Attends religious service: Not on file    Active member of club or organization: Not on file    Attends meetings of clubs or organizations: Not on file    Relationship status: Not on file  Other Topics Concern  . Not on file  Social History Narrative   Health Care POA:    Emergency Contact:    End of Life Plan:    Who lives with you: self   Any pets: none   Diet: Pt has a varied diet of protein, starch, and  vegetables.   Exercise: Pt reports walking daily to community trash cans.    Seatbelts: Pt reports wearing seatbelt when in vehicles.    Hobbies: growing flowers           Family History:  The patient's family history includes Cancer in her brother and sister; Diabetes in her maternal uncle; Heart disease in her brother and father; Hypertension in her father; Stroke in her father and mother.   ROS:   Please see the history of present illness.    ROS All other systems reviewed and are negative.   PHYSICAL EXAM:   VS:  BP 132/68   Pulse 71   Ht 4\' 9"  (1.448 m)   Wt 198 lb 12.8 oz (90.2 kg)   LMP  (LMP Unknown)   SpO2 98%   BMI 43.02 kg/m    GEN: Well nourished, well developed, in no acute distress  HEENT: normal  Neck: no JVD, carotid bruits, or masses Cardiac: RRR; no murmurs, rubs, or gallops,no edema  Respiratory:  clear to auscultation bilaterally, normal work of breathing GI: soft, nontender, nondistended, + BS MS: no deformity or atrophy  Skin: warm and dry, no rash Neuro:  Alert and Oriented x 3, Strength and sensation are intact Psych: euthymic mood, full affect  Wt Readings from Last 3 Encounters:  09/20/18 198 lb 12.8 oz (90.2 kg)  09/18/18 194 lb (88 kg)  09/08/18 198 lb (89.8 kg)      Studies/Labs Reviewed:   EKG:  EKG is not ordered today.   Recent Labs: 10/24/2017: TSH 6.850 12/05/2017: BNP 18.3; Magnesium 2.0 04/08/2018: ALT 18; BUN 28; Creatinine, Ser 0.87; Hemoglobin 13.0; Platelets 237; Potassium 4.3; Sodium 137   Lipid Panel    Component Value Date/Time   CHOL 171 07/16/2017 0402   TRIG 249 (H) 07/16/2017 0402   HDL 41 07/16/2017 0402   CHOLHDL 4.2 07/16/2017 0402   VLDL 50 (H) 07/16/2017 0402   LDLCALC 80 07/16/2017 0402   LDLDIRECT 103 (H) 03/11/2010 2127  Additional studies/ records that were reviewed today include:   Echocardiogram: 06/2017 Study Conclusions  - Left ventricle: The cavity size was normal. Wall thickness was    increased in a pattern of mild LVH. Systolic function was normal.   The estimated ejection fraction was in the range of 60% to 65%.   Doppler parameters are consistent with abnormal left ventricular   relaxation (grade 1 diastolic dysfunction). - Aortic valve: There was mild regurgitation. - Mitral valve: There was mild regurgitation. - Left atrium: The atrium was mildly dilated. - Pulmonic valve: There was moderate regurgitation. - Pulmonary arteries: PA peak pressure: 35 mm Hg (S). - Pericardium, extracardiac: A trivial pericardial effusion was   identified.  Stress test 08/2017  Nuclear stress EF: 67%.  Defect 1: There is a small defect of mild severity present in the mid anteroseptal and apical septal location.  This is a low risk study.  Findings consistent with ischemia.   Low risk stress nuclear study with a small area of apicoseptal ischemia and normal left ventricular regional and global systolic function. The true apex is spared. Findings may signify distal stenosis in a small LAD artery, but cannot exclude shifting breast attenuation artifact.   ASSESSMENT & PLAN:    1. HTN Intermittent blood pressure elevation in the morning.  Follow-up in hypertension clinic in few weeks with reading and home machine.  No change in medication today.  2. Chronic diastolic CHF -Euvolemic.  Continue torsemide.  Her dyspnea is most likely due to deconditioning.  Discussed in detail to increase exercise tolerance.  Denies any exertional chest heaviness.  3.  Palpitation -Very vague symptoms.  Noted extra beat by exam.  No arrhythmia.  Advised to complete cessation of caffeine intake.  If worsening symptoms she will let us know.? Due to anxiety related.    Medication Adjustments/Labs and Tests Ordered: Current medicines are reviewed at length with the patient today.  Concerns regarding medicines are outlined above.  Medication changes, Labs and Tests ordered today are listed in the  Patient Instructions below. Patient Instructions  Medication Instructions:  Your physician recommends that you continue on your current medications as directed. Please refer to the Current Medication list given to you today.  Labwork: None ordered.  Testing/Procedures: None ordered.  Follow-Up: Your physician recommends that you schedule a follow-up appointment in:   3-4 weeks with our Hypertension clinic  6 months with Dr Harrington Challenger  Any Other Special Instructions Will Be Listed Below (If Applicable).     If you need a refill on your cardiac medications before your next appointment, please call your pharmacy.     Jarrett Soho, [place]  09/20/2018 3:38 PM    [facility] [place], [place], [place]  [address] Phone: [phone]; Fax: [phone]

## 2018-09-22 NOTE — Telephone Encounter (Signed)
Family medicine after hours call progress note  Received call from patient that she had sudden onset "dizziness" after standing up from chair.  Patient is known diabetic and checked her sugar and it was 120s.  She then went to go lay down which brought about resolution of symptoms.  Symptoms lasted approximately 5 to 10 minutes per patient report.  She then checked her blood pressure after feeling better and it was in the 150s/90.  Patient reports that she felt a discomfort in her head and the room spinning.  She does not feel that she got out of her chair particularly fast.  She has never had these symptoms before.  She was seen on 09/12/2018 for benign paroxysmal positional vertigo.  She attributes this visit to ear problems.  Symptoms do seem consistent with this note from urgent care.  She did not try the meclizine.  Other considerations would be orthostatic hypotension.  Patient concerned that she may have hyperkalemia.  Per chart review has not had this checked since April 2019.  Believe her symptoms are likely related to orthostasis or vertigo.  Gave him strict instructions for patient to either call back to after hours line or go to urgent care if her symptoms come back and persist.  Patient voiced understanding that if she has persistent symptoms will go to urgent care, call after-hours line, or schedule appointment for Monday.  Guadalupe Dawn MD PGY-2 Family Medicine Resident

## 2018-09-25 DIAGNOSIS — H04123 Dry eye syndrome of bilateral lacrimal glands: Secondary | ICD-10-CM | POA: Diagnosis not present

## 2018-10-01 ENCOUNTER — Other Ambulatory Visit: Payer: Self-pay | Admitting: Family Medicine

## 2018-10-01 DIAGNOSIS — I10 Essential (primary) hypertension: Secondary | ICD-10-CM

## 2018-10-09 DIAGNOSIS — K224 Dyskinesia of esophagus: Secondary | ICD-10-CM | POA: Diagnosis not present

## 2018-10-16 ENCOUNTER — Encounter: Payer: Self-pay | Admitting: Family Medicine

## 2018-10-16 ENCOUNTER — Ambulatory Visit: Payer: Medicare Other | Admitting: Family Medicine

## 2018-10-16 ENCOUNTER — Other Ambulatory Visit: Payer: Self-pay

## 2018-10-16 VITALS — BP 130/58 | HR 62 | Temp 98.4°F | Ht 60.0 in | Wt 195.4 lb

## 2018-10-16 DIAGNOSIS — R7989 Other specified abnormal findings of blood chemistry: Secondary | ICD-10-CM | POA: Diagnosis not present

## 2018-10-16 DIAGNOSIS — E1143 Type 2 diabetes mellitus with diabetic autonomic (poly)neuropathy: Secondary | ICD-10-CM | POA: Diagnosis not present

## 2018-10-16 DIAGNOSIS — Z794 Long term (current) use of insulin: Secondary | ICD-10-CM | POA: Diagnosis not present

## 2018-10-16 DIAGNOSIS — I5032 Chronic diastolic (congestive) heart failure: Secondary | ICD-10-CM | POA: Diagnosis not present

## 2018-10-16 DIAGNOSIS — I10 Essential (primary) hypertension: Secondary | ICD-10-CM | POA: Diagnosis not present

## 2018-10-16 LAB — POCT GLYCOSYLATED HEMOGLOBIN (HGB A1C): HBA1C, POC (CONTROLLED DIABETIC RANGE): 6.7 % (ref 0.0–7.0)

## 2018-10-16 NOTE — Patient Instructions (Addendum)
It was nice meeting you today Rebecca Walter!  Continue doing a great job taking care of your diabetes and blood pressure.  Your A1c was 6.7 today, which is at goal.  We are not going to change any of your medications since they are working well for you currently.  We will get a lipid panel, a CMP to check your liver, kidneys, and electrolytes, and a TSH to measure your thyroid function.  I will call you of any of these results are abnormal.  I would like to see you again in about 3 months to check your A1c.  If you have any questions or concerns, please feel free to call the clinic.   Be well,  Dr. Shan Levans

## 2018-10-16 NOTE — Assessment & Plan Note (Addendum)
A1c is 6.7 today, reduced from 7.0 at her last visit.  Discussed with the patient that we could reduce her insulin dose, since evidence that showed that the goal A1c for her age is less than 8, but patient says that she does not want to change her insulin because she likes for her sugars to be tightly controlled.  Since she is not experiencing any hypoglycemic events while on this regimen, we will continue her current insulin dose.  We will obtain a lipid panel today since she is currently not on a statin and was interested in getting this done.  Her LDL was high at the last check about a year and half ago.  I will see her back in 3 to 6 months for her next A1c check.

## 2018-10-16 NOTE — Progress Notes (Signed)
Subjective:    Rebecca Walter - 77 y.o. female MRN [id number]  Date of birth: [birth year]  CC Hypertension and diabetes follow up  HPI  Rebecca Walter is here for follow-up of hypertension and diabetes.  Hypertension -Patient currently taking amlodipine, candesartan, Coreg, and spironolactone, along with torsemide every other day for fluid control -Patient takes her blood pressure at least once per day and says that it is normally less than 130/90, although there are times when it it is elevated, particularly when she is anxious -She is very compliant with her medications  Diabetes -Patient takes to 70 units each morning along with NovoLog 18 units if her blood sugars are less than 180 after a meal or 20 units of her blood sugars are greater than 180 -Denies hypoglycemic symptoms -Is working on eating a lower carbohydrate and low-salt diet -Checks her sugars 3 times per day and says that they are usually in the mid 100s -Has been trying to walk more, but it is difficult with her left knee pain -Has seen the eye doctor recently and has some dry eyes from her CPAP machine  Health Maintenance:  Health Maintenance Due  Topic Date Due  . OPHTHALMOLOGY EXAM  07/12/2018    -  reports that she has never smoked. She has never used smokeless tobacco. - Review of Systems: Per HPI. - Past Medical History: Patient Active Problem List   Diagnosis Date Noted  . Hypertension 10/17/2018  . Elevated TSH 11/21/2017  . Hypertensive heart disease with CHF (congestive heart failure) ([place])   . Chronic diastolic CHF (congestive heart failure) ([place]) 07/15/2017  . Mild aortic stenosis 06/19/2017  . Mild mitral regurgitation 06/19/2017  . Moderate pulmonary valve insufficiency 06/19/2017  . Lumbar back pain with radiculopathy affecting right lower extremity 10/29/2016  . Viral URI 02/19/2013  . Allergic rhinitis 03/22/2011  . INSOMNIA 09/16/2009  . GASTROPARESIS 08/12/2009  . Anxiety 10/18/2008    . DYSKINESIA, ESOPHAGUS 09/13/2007  . CARCINOMA, BASAL CELL 05/26/2007  . Diabetes mellitus, type II ([place]) 02/16/2007  . HLD (hyperlipidemia) 02/16/2007  . OBESITY, NOS 02/16/2007  . GASTROESOPHAGEAL REFLUX, NO ESOPHAGITIS 02/16/2007  . OSTEOARTHRITIS, MULTI SITES 02/16/2007  . Osteopenia after menopause 02/16/2007  . Gout 02/16/2007   - Medications: reviewed and updated   Objective:   Physical Exam BP (!) 130/58   Pulse 62   Temp 98.4 F (36.9 C) (Oral)   Ht 5' (1.524 m)   Wt 195 lb 6.4 oz (88.6 kg)   LMP  (LMP Unknown)   SpO2 96%   BMI 38.16 kg/m  Gen: NAD, alert, cooperative with exam, well-appearing CV: RRR with some extra beats, good S1/S2, no murmur, no edema Resp: CTABL, no wheezes, non-labored Abd: SNTND, BS present, no guarding or organomegaly Skin: no rashes, normal turgor  Neuro: no gross deficits.  Psych: good insight, alert and oriented        Assessment & Plan:   Diabetes mellitus, type II (HCC) A1c is 6.7 today, reduced from 7.0 at her last visit.  Discussed with the patient that we could reduce her insulin dose, since evidence that showed that the goal A1c for her age is less than 8, but patient says that she does not want to change her insulin because she likes for her sugars to be tightly controlled.  Since she is not experiencing any hypoglycemic events while on this regimen, we will continue her current insulin dose.  We will obtain a lipid panel today  since she is currently not on a statin and was interested in getting this done.  Her LDL was high at the last check about a year and half ago.  I will see her back in 3 to 6 months for her next A1c check.  Hypertension Patient's blood pressure is well controlled today at 130/58.  Patient is compliant with her medications and say that they work well for her, so we will continue her medications at the current dose.  Elevated TSH Patient's TSH has been elevated in the past, and we will check it again  today per her request.    Maia Breslow, M.D. 10/18/2018, 7:49 AM PGY-2, [place]

## 2018-10-17 ENCOUNTER — Ambulatory Visit (INDEPENDENT_AMBULATORY_CARE_PROVIDER_SITE_OTHER): Payer: Medicare Other | Admitting: Pharmacist

## 2018-10-17 DIAGNOSIS — G4733 Obstructive sleep apnea (adult) (pediatric): Secondary | ICD-10-CM | POA: Diagnosis not present

## 2018-10-17 DIAGNOSIS — I152 Hypertension secondary to endocrine disorders: Secondary | ICD-10-CM | POA: Insufficient documentation

## 2018-10-17 DIAGNOSIS — I10 Essential (primary) hypertension: Secondary | ICD-10-CM | POA: Diagnosis not present

## 2018-10-17 DIAGNOSIS — E1159 Type 2 diabetes mellitus with other circulatory complications: Secondary | ICD-10-CM | POA: Insufficient documentation

## 2018-10-17 LAB — LIPID PANEL
CHOL/HDL RATIO: 4.4 ratio (ref 0.0–4.4)
Cholesterol, Total: 154 mg/dL (ref 100–199)
HDL: 35 mg/dL — ABNORMAL LOW (ref >39)
LDL CALC: 54 mg/dL (ref 0–99)
Triglycerides: 323 mg/dL — ABNORMAL HIGH (ref 0–149)
VLDL Cholesterol Cal: 65 mg/dL — ABNORMAL HIGH (ref 5–40)

## 2018-10-17 LAB — COMPREHENSIVE METABOLIC PANEL
ALT: 18 IU/L (ref 0–32)
AST: 14 IU/L (ref 0–40)
Albumin/Globulin Ratio: 1.5 (ref 1.2–2.2)
Albumin: 4.3 g/dL (ref 3.5–4.8)
Alkaline Phosphatase: 46 IU/L (ref 39–117)
BUN/Creatinine Ratio: 26 (ref 12–28)
BUN: 24 mg/dL (ref 8–27)
CALCIUM: 10 mg/dL (ref 8.7–10.3)
CO2: 25 mmol/L (ref 20–29)
Chloride: 100 mmol/L (ref 96–106)
Creatinine, Ser: 0.91 mg/dL (ref 0.57–1.00)
GFR, EST AFRICAN AMERICAN: 69 mL/min/{1.73_m2} (ref >59)
GFR, EST NON AFRICAN AMERICAN: 60 mL/min/{1.73_m2} (ref >59)
GLOBULIN, TOTAL: 2.8 g/dL (ref 1.5–4.5)
Glucose: 131 mg/dL — ABNORMAL HIGH (ref 65–99)
Potassium: 4.7 mmol/L (ref 3.5–5.2)
SODIUM: 142 mmol/L (ref 134–144)
TOTAL PROTEIN: 7.1 g/dL (ref 6.0–8.5)

## 2018-10-17 LAB — TSH: TSH: 20.39 u[IU]/mL — ABNORMAL HIGH (ref 0.450–4.500)

## 2018-10-17 NOTE — Patient Instructions (Addendum)
Your blood pressure goal is < 130/7mmHg  Try to limit your intake of grits, bacon and sausage - thee foods are higher in salt or cholesterol  Look for decaf coffee  Continue taking your current medications  Check your blood pressure about 2 hours after taking your medication  Call Megan, Pharmacist with any blood pressure concerns [phone]

## 2018-10-17 NOTE — Progress Notes (Signed)
Patient ID: Rebecca Walter                 DOB: 08-05-1941                      MRN: [id number]     HPI: Rebecca Walter is a 77 y.o. female patient of Dr Harrington Challenger referred by Robbie Lis, PA to HTN clinic. PMH is significant for HTN, HLD, DM, chronic diastolic CHF, and mild AI/MR. She was seen in clinic 1 month ago and BP was controlled at 132/68, no medication changes were made. Pt reported intermittent BP elevations in the morning and presents today for follow up.  Pt presents today with her son Merry Proud to clinic. She checks her BP at home twice a day - in the AM before her morning medications and in the evening ~9:30pm. She denies dizziness, blurred vision, falls, headache, or LEE. She wears compression stockings each day. Denies adverse events with her medication. She purchased a bicep BP cuff a few months ago and brings in home readings today. Low systolic of 409, high of 811B. BP controlled at PCP visit yesterday 130/58. Reports higher readings in the AM although this is consistent with her checking BP prior to taking AM medications. She took her medications this AM. Reports her anxiety medication has helped her BP as well.  Exercise is limited, pt ambulates with a cane and walks short distances for activity. Eats a large breakfast - see below for details. Discussed decreasing frequency of eggs, bacon, sausage, and grits as she eats these daily. Also drinks 3 cups of caffeinated coffee.  Current HTN meds: amlodipine 10mg  daily (AM), candesartan 32mg  daily (PM), carvedilol 3.125mg  BID, spironolactone 25mg  daily (AM), torsemide 20mg  QOD  Previously tried: carvedilol - SOB, losartan - diarrhea, clonidine - does not recall side effect  BP goal: <130/48mmHg  Family History: The patient's family history includes Cancer in her brother and sister; Diabetes in her maternal uncle; Heart disease in her brother and father; Hypertension in her father; Stroke in her father and mother.   Social History: Denies  tobacco, alcohol, and illicit drug use.  Diet: Does not add salt to food.  Breakfast - eggs, bacon/sausage, grits, plain yogurt, cottage cheese, and fruit every morning. Lunch - sandwich and a half Dinner - protein and vegetables - gets dinner from [employer] Snacks - graham crackers, likes cookies and cake per her son Drinks - waters down juice, drinks 3 cups of coffee each morning  Exercise: Walks a bit, ambulates with a cane.  Home BP readings: 138/62, 131/60, 131/62, 138/59, 144/62, 128/60, 144/63, 126/53  Wt Readings from Last 3 Encounters:  10/16/18 195 lb 6.4 oz (88.6 kg)  09/20/18 198 lb 12.8 oz (90.2 kg)  09/18/18 194 lb (88 kg)   BP Readings from Last 3 Encounters:  10/16/18 (!) 130/58  09/20/18 132/68  09/18/18 (!) 144/62   Pulse Readings from Last 3 Encounters:  10/16/18 62  09/20/18 71  09/18/18 66    Renal function: Estimated Creatinine Clearance: 49.6 mL/min (by C-G formula based on SCr of 0.91 mg/dL).  Past Medical History:  Diagnosis Date  . Anxiety   . Arthritis   . Chronic diastolic CHF (congestive heart failure) ([place])   . Diabetes mellitus   . GERD (gastroesophageal reflux disease)   . Gout   . Hyperlipidemia   . Hypertension   . Hypertensive heart disease with CHF (congestive heart failure) ([place])   .  Mild aortic stenosis 06/2017  . Mild mitral regurgitation 06/2017  . Moderate pulmonary valve insufficiency 06/2017  . Osteopenia     Current Outpatient Medications on File Prior to Visit  Medication Sig Dispense Refill  . acetaminophen (TYLENOL) 325 MG tablet Take 325 mg by mouth every 8 (eight) hours as needed (pain).     Marland Kitchen allopurinol (ZYLOPRIM) 100 MG tablet TAKE 2 TABLETS BY MOUTH EVERY DAY 60 tablet 8  . alum & mag hydroxide-simeth (MAALOX/MYLANTA) 200-200-20 MG/5ML suspension Take 30 mLs by mouth every 6 (six) hours as needed for indigestion or heartburn. 355 mL 0  . amLODipine (NORVASC) 10 MG tablet Take 1 tablet (10 mg total) by  mouth daily. 90 tablet 1  . aspirin 81 MG chewable tablet Chew 1 tablet (81 mg total) by mouth daily. 30 tablet 5  . azelastine (ASTELIN) 0.1 % nasal spray Place 1 spray into both nostrils 2 (two) times daily. Use in each nostril as directed 30 mL 3  . B-D ULTRAFINE III SHORT PEN 31G X 8 MM MISC USE AS DIRECTED 100 each 5  . Blood Glucose Monitoring Suppl (ACCU-CHEK AVIVA) device Use as instructed 1 each 0  . busPIRone (BUSPAR) 10 MG tablet Take 1 tablet (10 mg total) by mouth 2 (two) times daily. 60 tablet 2  . Camphor-Eucalyptus-Menthol (VICKS VAPORUB EX) Place 1 application into both nostrils daily as needed (congestion).    . candesartan (ATACAND) 32 MG tablet TAKE 1 TABLET (32 MG TOTAL) BY MOUTH AT BEDTIME 30 tablet 3  . carvedilol (COREG) 3.125 MG tablet Take 1 tablet (3.125 mg total) by mouth 2 (two) times daily with a meal. 60 tablet 3  . fluticasone (FLONASE) 50 MCG/ACT nasal spray Place 2 sprays into both nostrils daily. 16 g 6  . glucose blood (ACCU-CHEK AVIVA PLUS) test strip CHECK BLOOD SUGAR FIRST  THING IN THE MORNING BEFORE EATING, AFTER LUNCH, AND  AFTER DINNER TOTAL 3 TIMES  DAILY 300 each 3  . insulin aspart (NOVOLOG FLEXPEN) 100 UNIT/ML FlexPen Inject 20 units into the skin three times daily before meals (Patient taking differently: Inject 9-18 Units into the skin See admin instructions. Inject 15 units subcutaneously daily with breakfast, inject 9 units with lunch as needed for CBG 180 or more, and inject 9 units with supper) 15 pen 3  . Insulin Glargine (LANTUS SOLOSTAR) 100 UNIT/ML Solostar Pen INJECT 70 UNITS INTO THE SKIN EVERY MORNING. 1 pen 2  . loratadine (CLARITIN) 10 MG tablet Take 10 mg by mouth daily.    Marland Kitchen lovastatin (MEVACOR) 40 MG tablet Take 1 tablet (40 mg total) by mouth daily with supper. 90 tablet 3  . meclizine (ANTIVERT) 12.5 MG tablet Take 1 tablet (12.5 mg total) by mouth 3 (three) times daily as needed for dizziness. 30 tablet 0  . metFORMIN (GLUCOPHAGE-XR)  500 MG 24 hr tablet TAKE 1 TABLET TWICE A DAY BEFORE MEALS 180 tablet 3  . metoCLOPramide (REGLAN) 5 MG tablet TAKE 1 TABLET 30 MIN BEFORE MEALS AND 1 TABLET AT BEDTIME. FOUR TIMES/DAY  4  . Multiple Vitamin (MULTIVITAMIN WITH MINERALS) TABS tablet Take 1 tablet by mouth daily.    Marland Kitchen OVER THE COUNTER MEDICATION Place 1 drop into both eyes daily. Over the counter preservative free lubricating eye drops    . pantoprazole (PROTONIX) 40 MG tablet Take 40 mg by mouth 2 (two) times daily.     . Probiotic Product (PROBIOTIC PEARLS) CAPS Take 1 capsule by mouth daily with  lunch.     . ranitidine (ZANTAC) 150 MG tablet Take 150 mg by mouth daily as needed for heartburn (acid reflux).     . sodium chloride (OCEAN) 0.65 % SOLN nasal spray Place 1 spray into both nostrils daily.    Marland Kitchen spironolactone (ALDACTONE) 25 MG tablet TAKE 1 TABLET BY MOUTH EVERY DAY 30 tablet 2  . torsemide (DEMADEX) 20 MG tablet Take 1 tablet (20 mg total) by mouth every other day. 30 tablet 5  . vitamin E 400 UNIT capsule Take 400 Units by mouth daily.     No current facility-administered medications on file prior to visit.     Allergies  Allergen Reactions  . Baclofen Other (See Comments)    Caused leg weakness  . Hctz [Hydrochlorothiazide] Other (See Comments)    Uric acid elevation and gout.     . Lotensin [Benazepril] Other (See Comments)    Dry cough   . Victoza [Liraglutide] Other (See Comments)    Tongue Glossitus  . Acyclovir And Related Diarrhea  . Beta Adrenergic Blockers Other (See Comments)    Occurred with metoprolol  REACTION: coughing  . Losartan Diarrhea     Assessment/Plan:  1. Hypertension - BP at goal in right arm, slightly above goal <130/46mmHg in left arm. Home BP readings mostly at goal or very close, PCP BP check yesterday was at goal as well. Will continue current regimen including amlodipine 10mg  daily, candesartan 32mg  daily, carvedilol 3.125mg  BID, spironolactone 25mg  daily, and torsemide  20mg  QOD. Unable to further titrate carvedilol due to HR 50-60s, and unable to titrate spironolactone due to K ~5, all other medications optimized dose-wise. Encouraged pt to reduce sodium in her breakfast in particular and to stay active with walking. She will continue to monitor BP at home and call clinic if BP readings rise above goal consistently. F/u in HTN clinic as needed.   Kaynen Minner E. Marcea Rojek, PharmD, BCACP, [place] [address], [place], [place] [address] Phone: [phone]; Fax: [phone] 10/17/2018 3:59 PM

## 2018-10-18 ENCOUNTER — Telehealth: Payer: Self-pay | Admitting: Family Medicine

## 2018-10-18 NOTE — Assessment & Plan Note (Signed)
Patient's blood pressure is well controlled today at 130/58.  Patient is compliant with her medications and say that they work well for her, so we will continue her medications at the current dose.

## 2018-10-18 NOTE — Telephone Encounter (Signed)
Contacted patient regarding her elevated TSH.  Told her that at her next visit, we will also draw a T4 level to make sure that her actual hormone level is normal.  She was agreeable to this plan.  She also asked if I could see what I could do to lower the cost of her NovoLog.  I told her that I would message our clinical social worker to see if she had any ideas.

## 2018-10-18 NOTE — Assessment & Plan Note (Signed)
Patient's TSH has been elevated in the past, and we will check it again today per her request.

## 2018-11-01 ENCOUNTER — Telehealth: Payer: Self-pay | Admitting: Family Medicine

## 2018-11-01 ENCOUNTER — Other Ambulatory Visit: Payer: Self-pay

## 2018-11-01 MED ORDER — ACCU-CHEK AVIVA PLUS W/DEVICE KIT: 1.0000 | PACK | Freq: Every day | 3 refills | Status: DC

## 2018-11-01 NOTE — Telephone Encounter (Signed)
Pt needs a new glucose monitor called in. She uses the Accu check avia plus. Her current monitor is not being accurate, it is either giving the same reading all day long or just random readings jw

## 2018-11-02 ENCOUNTER — Other Ambulatory Visit: Payer: Self-pay | Admitting: Family Medicine

## 2018-11-02 MED ORDER — ACCU-CHEK AVIVA PLUS W/DEVICE KIT: 1.0000 | PACK | Freq: Every day | 3 refills | Status: DC

## 2018-11-02 NOTE — Telephone Encounter (Signed)
I have reordered patient's glucometer per her request.  Thanks!

## 2018-11-06 ENCOUNTER — Telehealth: Payer: Self-pay | Admitting: Family Medicine

## 2018-11-06 ENCOUNTER — Other Ambulatory Visit: Payer: Self-pay | Admitting: Family Medicine

## 2018-11-06 NOTE — Telephone Encounter (Signed)
Pt called and would to speak Dr. Valentina Lucks, about some symptoms she is having and wants to make sure that the medication is not causing her to feel this way. jw

## 2018-11-06 NOTE — Telephone Encounter (Signed)
Pt would like for Dr. Valentina Lucks to call her. She has some questions about her medications.

## 2018-11-06 NOTE — Telephone Encounter (Signed)
Pt is calling for a refill on her Lantuss, she has only one vial left.jw

## 2018-11-07 ENCOUNTER — Telehealth: Payer: Self-pay | Admitting: *Deleted

## 2018-11-07 ENCOUNTER — Ambulatory Visit: Payer: Medicare Other | Admitting: Podiatry

## 2018-11-07 DIAGNOSIS — G4700 Insomnia, unspecified: Secondary | ICD-10-CM

## 2018-11-07 NOTE — Telephone Encounter (Signed)
Patient called stating her pressure is blowing too strong and is drying her eyes out and affecting her vision. Her 2 week auto titration is scanned in with a date of 08/31/17. Patient says her machine is 11 cm now but she states  8 cm worked good for her in the sleep lab.

## 2018-11-07 NOTE — Telephone Encounter (Signed)
Pt is calling again and said she is feeling better but she still wants to discuss the symptoms she was having yesterday. Pt would like for Dr. Valentina Lucks to call her as soon as possible.

## 2018-11-07 NOTE — Telephone Encounter (Signed)
-----   Message from Sueanne Margarita, MD sent at 11/07/2018  2:22 PM EST ----- Regarding: RE: CPAP PRESSURE Reviewed CPAP download.  Please decrease CPAP to 9cm H2O and get a download in 2 weeks  Traci Turner ----- Message ----- From: Freada Bergeron, CMA Sent: 11/07/2018   9:07 AM EST To: Sueanne Margarita, MD Subject: CPAP PRESSURE                                  Patient called stating her pressure is blowing too strong and is drying her eyes out and affecting her vision. Her 2 week auto titration is scanned in with a date of 08/31/17. Patient says her machine is 11 cm now but she states  8 cm worked good for her in the sleep lab. Please davise, Gae Bon

## 2018-11-07 NOTE — Telephone Encounter (Signed)
Order placed to [facility]. 

## 2018-11-08 NOTE — Telephone Encounter (Signed)
Patient reported leg weakness on Sunday (3 days prior) and reports she believed that was due to "Baclofen".   Patient stopped her torsemide and has NOT taken any additional baclofen.   Advised her to restart  torsemide as her weight was 197 / 198 lbs.  Asked to plan on coming to office if her weight fails to decrease by 1-2 pounds over the next several days.   She agreed to restart her torsemide.  We agreed she should avoid Baclofen OR take 0.5 of a pill if she decides to use this medication.    Reeducated on safety of ranitidine (she is relieved that she can continue her ranitidine).  She will likely switch back from famotidine to ranitidine OTC.

## 2018-11-08 NOTE — Telephone Encounter (Signed)
Thank you for your help, Dr. Valentina Lucks!

## 2018-11-15 ENCOUNTER — Ambulatory Visit: Payer: Medicare Other | Admitting: Podiatry

## 2018-11-15 DIAGNOSIS — M79675 Pain in left toe(s): Secondary | ICD-10-CM | POA: Diagnosis not present

## 2018-11-15 DIAGNOSIS — B351 Tinea unguium: Secondary | ICD-10-CM

## 2018-11-15 DIAGNOSIS — M79674 Pain in right toe(s): Secondary | ICD-10-CM

## 2018-11-15 NOTE — Patient Instructions (Signed)
Onychomycosis/Fungal Toenails  WHAT IS IT? An infection that lies within the keratin of your nail plate that is caused by a fungus.  WHY ME? Fungal infections affect all ages, sexes, races, and creeds.  There may be many factors that predispose you to a fungal infection such as age, coexisting medical conditions such as diabetes, or an autoimmune disease; stress, medications, fatigue, genetics, etc.  Bottom line: fungus thrives in a warm, moist environment and your shoes offer such a location.  IS IT CONTAGIOUS? Theoretically, yes.  You do not want to share shoes, nail clippers or files with someone who has fungal toenails.  Walking around barefoot in the same room or sleeping in the same bed is unlikely to transfer the organism.  It is important to realize, however, that fungus can spread easily from one nail to the next on the same foot.  HOW DO WE TREAT THIS?  There are several ways to treat this condition.  Treatment may depend on many factors such as age, medications, pregnancy, liver and kidney conditions, etc.  It is best to ask your doctor which options are available to you.  1. No treatment.   Unlike many other medical concerns, you can live with this condition.  However for many people this can be a painful condition and may lead to ingrown toenails or a bacterial infection.  It is recommended that you keep the nails cut short to help reduce the amount of fungal nail. 2. Topical treatment.  These range from herbal remedies to prescription strength nail lacquers.  About 40-50% effective, topicals require twice daily application for approximately 9 to 12 months or until an entirely new nail has grown out.  The most effective topicals are medical grade medications available through physicians offices. 3. Oral antifungal medications.  With an 80-90% cure rate, the most common oral medication requires 3 to 4 months of therapy and stays in your system for a year as the new nail grows out.  Oral  antifungal medications do require blood work to make sure it is a safe drug for you.  A liver function panel will be performed prior to starting the medication and after the first month of treatment.  It is important to have the blood work performed to avoid any harmful side effects.  In general, this medication safe but blood work is required. 4. Laser Therapy.  This treatment is performed by applying a specialized laser to the affected nail plate.  This therapy is noninvasive, fast, and non-painful.  It is not covered by insurance and is therefore, out of pocket.  The results have been very good with a 80-95% cure rate.  The [facility] is the only practice in the area to offer this therapy. 5. Permanent Nail Avulsion.  Removing the entire nail so that a new nail will not grow back.  Diabetes and Foot Care Diabetes may cause you to have problems because of poor blood supply (circulation) to your feet and legs. This may cause the skin on your feet to become thinner, break easier, and heal more slowly. Your skin may become dry, and the skin may peel and crack. You may also have nerve damage in your legs and feet causing decreased feeling in them. You may not notice minor injuries to your feet that could lead to infections or more serious problems. Taking care of your feet is one of the most important things you can do for yourself. Follow these instructions at home:  Wear   shoes at all times, even in the house. Do not go barefoot. Bare feet are easily injured.  Check your feet daily for blisters, cuts, and redness. If you cannot see the bottom of your feet, use a mirror or ask someone for help.  Wash your feet with warm water (do not use hot water) and mild soap. Then pat your feet and the areas between your toes until they are completely dry. Do not soak your feet as this can dry your skin.  Apply a moisturizing lotion or petroleum jelly (that does not contain alcohol and is unscented) to the  skin on your feet and to dry, brittle toenails. Do not apply lotion between your toes.  Trim your toenails straight across. Do not dig under them or around the cuticle. File the edges of your nails with an emery board or nail file.  Do not cut corns or calluses or try to remove them with medicine.  Wear clean socks or stockings every day. Make sure they are not too tight. Do not wear knee-high stockings since they may decrease blood flow to your legs.  Wear shoes that fit properly and have enough cushioning. To break in new shoes, wear them for just a few hours a day. This prevents you from injuring your feet. Always look in your shoes before you put them on to be sure there are no objects inside.  Do not cross your legs. This may decrease the blood flow to your feet.  If you find a minor scrape, cut, or break in the skin on your feet, keep it and the skin around it clean and dry. These areas may be cleansed with mild soap and water. Do not cleanse the area with peroxide, alcohol, or iodine.  When you remove an adhesive bandage, be sure not to damage the skin around it.  If you have a wound, look at it several times a day to make sure it is healing.  Do not use heating pads or hot water bottles. They may burn your skin. If you have lost feeling in your feet or legs, you may not know it is happening until it is too late.  Make sure your health care provider performs a complete foot exam at least annually or more often if you have foot problems. Report any cuts, sores, or bruises to your health care provider immediately. Contact a health care provider if:  You have an injury that is not healing.  You have cuts or breaks in the skin.  You have an ingrown nail.  You notice redness on your legs or feet.  You feel burning or tingling in your legs or feet.  You have pain or cramps in your legs and feet.  Your legs or feet are numb.  Your feet always feel cold. Get help right away  if:  There is increasing redness, swelling, or pain in or around a wound.  There is a red line that goes up your leg.  Pus is coming from a wound.  You develop a fever or as directed by your health care provider.  You notice a bad smell coming from an ulcer or wound. This information is not intended to replace advice given to you by your health care provider. Make sure you discuss any questions you have with your health care provider. Document Released: 12/03/2000 Document Revised: 05/13/2016 Document Reviewed: 05/15/2013 Elsevier Interactive Patient Education  2017 [employer]  

## 2018-11-22 ENCOUNTER — Telehealth: Payer: Self-pay | Admitting: Family Medicine

## 2018-11-22 NOTE — Telephone Encounter (Signed)
Patient also left message on nurse line. Her question is regarding if she should take her Spironolactone today due to some symptoms she is having with her fingertips feeling cold.  Danley Danker, RN [facility] RN)

## 2018-11-22 NOTE — Telephone Encounter (Signed)
Pt is calling and would like to have orders put in so that she can have am iron test done. She would also like for the doctor to call her and discuss some symptoms she is having. jw

## 2018-11-23 ENCOUNTER — Other Ambulatory Visit: Payer: Self-pay | Admitting: Family Medicine

## 2018-11-23 DIAGNOSIS — R531 Weakness: Secondary | ICD-10-CM

## 2018-11-23 DIAGNOSIS — I5032 Chronic diastolic (congestive) heart failure: Secondary | ICD-10-CM

## 2018-11-23 NOTE — Telephone Encounter (Signed)
Returned Ms. Pascal's call regarding her questions about spironolactone and obtaining an iron level.  She says that she is feeling very weak in the mornings, which concerned her since she used to be able to take long walks each morning.  She worries that this is due to anemia or dehydration or her heart rate being too low.  She says that her pulse is sometimes in be 50s when she checks her blood pressure, and she wonders if her carvedilol could be causing this.  She was advised to continue to take her torsemide and spironolactone as prescribed, but she could take a break from the carvedilol for a day or two to see if this helps with her weakness and fatigue.  I advised her that her CBC was normal in April 2019, but she is worried that anemia may also be causing her weakness.  I placed a future lab order for a BMP and CBC with ferritin and made her a lab appointment for 12/6.  I also made her an appointment to see me on 12/27 to discuss these issues further.

## 2018-11-24 ENCOUNTER — Other Ambulatory Visit: Payer: Medicare Other

## 2018-11-24 DIAGNOSIS — I5032 Chronic diastolic (congestive) heart failure: Secondary | ICD-10-CM

## 2018-11-24 DIAGNOSIS — R531 Weakness: Secondary | ICD-10-CM

## 2018-11-25 ENCOUNTER — Encounter: Payer: Self-pay | Admitting: Family Medicine

## 2018-11-25 LAB — BASIC METABOLIC PANEL
BUN/Creatinine Ratio: 20 (ref 12–28)
BUN: 19 mg/dL (ref 8–27)
CALCIUM: 9.9 mg/dL (ref 8.7–10.3)
CO2: 22 mmol/L (ref 20–29)
Chloride: 104 mmol/L (ref 96–106)
Creatinine, Ser: 0.96 mg/dL (ref 0.57–1.00)
GFR calc Af Amer: 65 mL/min/{1.73_m2} (ref >59)
GFR calc non Af Amer: 56 mL/min/{1.73_m2} — ABNORMAL LOW (ref >59)
Glucose: 125 mg/dL — ABNORMAL HIGH (ref 65–99)
POTASSIUM: 4.3 mmol/L (ref 3.5–5.2)
Sodium: 142 mmol/L (ref 134–144)

## 2018-11-25 LAB — CBC
Hematocrit: 35.7 % (ref 34.0–46.6)
Hemoglobin: 12.4 g/dL (ref 11.1–15.9)
MCH: 31.5 pg (ref 26.6–33.0)
MCHC: 34.7 g/dL (ref 31.5–35.7)
MCV: 91 fL (ref 79–97)
Platelets: 252 10*3/uL (ref 150–450)
RBC: 3.94 x10E6/uL (ref 3.77–5.28)
RDW: 13.2 % (ref 12.3–15.4)
WBC: 8.3 10*3/uL (ref 3.4–10.8)

## 2018-11-25 LAB — FERRITIN: FERRITIN: 69 ng/mL (ref 15–150)

## 2018-11-28 ENCOUNTER — Other Ambulatory Visit: Payer: Self-pay | Admitting: Family Medicine

## 2018-11-28 ENCOUNTER — Telehealth: Payer: Self-pay | Admitting: Family Medicine

## 2018-11-28 DIAGNOSIS — F419 Anxiety disorder, unspecified: Secondary | ICD-10-CM

## 2018-11-28 NOTE — Telephone Encounter (Signed)
Patient reports worsening leg weakness with use of torsemide.  She reports weights of 194-196# and uses torsemide when weights higher than 193#.   Patient did not have electrolyte abnormalities on most recent lab assessment 11/24/18.    Patient plans to use 1/2 of 20mg  tablet (10mg  per dose) which provides diuresis until follow up with Dr. Shan Levans on 12/15/2018.     Discussed only PRN use for increased weights.  She hopes this lower dose will keep her weight and swelling controlled while avoiding "weakness".    She verbalize appreciation for the call.

## 2018-11-28 NOTE — Telephone Encounter (Signed)
Pt would like [name] to give her a call back to discuss her fluid pill. Please advise

## 2018-11-28 NOTE — Telephone Encounter (Signed)
Pt is calling back and asked if Dr. Valentina Lucks could call her as well concerning her medications.

## 2018-11-29 ENCOUNTER — Other Ambulatory Visit: Payer: Medicare Other

## 2018-11-30 ENCOUNTER — Other Ambulatory Visit: Payer: Self-pay | Admitting: Family Medicine

## 2018-11-30 DIAGNOSIS — I5032 Chronic diastolic (congestive) heart failure: Principal | ICD-10-CM

## 2018-11-30 DIAGNOSIS — I110 Hypertensive heart disease with heart failure: Secondary | ICD-10-CM

## 2018-12-03 ENCOUNTER — Encounter: Payer: Self-pay | Admitting: Podiatry

## 2018-12-03 NOTE — Progress Notes (Signed)
Subjective: Rebecca Walter presents today with painful, thick toenails 1-5 b/l that she cannot cut and which interfere with daily activities.  Pain is aggravated when wearing enclosed shoe gear.  Kathrene Alu, MD is her PCP and last visit was 10/16/2018.   Current Outpatient Medications:  .  acetaminophen (TYLENOL) 325 MG tablet, Take 325 mg by mouth every 8 (eight) hours as needed (pain). , Disp: , Rfl:  .  allopurinol (ZYLOPRIM) 100 MG tablet, TAKE 2 TABLETS BY MOUTH EVERY DAY, Disp: 60 tablet, Rfl: 8 .  alum & mag hydroxide-simeth (MAALOX/MYLANTA) 200-200-20 MG/5ML suspension, Take 30 mLs by mouth every 6 (six) hours as needed for indigestion or heartburn., Disp: 355 mL, Rfl: 0 .  amLODipine (NORVASC) 10 MG tablet, Take 1 tablet (10 mg total) by mouth daily., Disp: 90 tablet, Rfl: 1 .  aspirin 81 MG chewable tablet, Chew 1 tablet (81 mg total) by mouth daily., Disp: 30 tablet, Rfl: 5 .  azelastine (ASTELIN) 0.1 % nasal spray, Place 1 spray into both nostrils 2 (two) times daily. Use in each nostril as directed, Disp: 30 mL, Rfl: 3 .  B-D ULTRAFINE III SHORT PEN 31G X 8 MM MISC, USE AS DIRECTED, Disp: 100 each, Rfl: 5 .  Blood Glucose Monitoring Suppl (ACCU-CHEK AVIVA PLUS) w/Device KIT, 1 Device by Does not apply route daily., Disp: 1 kit, Rfl: 3 .  Camphor-Eucalyptus-Menthol (VICKS VAPORUB EX), Place 1 application into both nostrils daily as needed (congestion)., Disp: , Rfl:  .  candesartan (ATACAND) 32 MG tablet, TAKE 1 TABLET (32 MG TOTAL) BY MOUTH AT BEDTIME, Disp: 30 tablet, Rfl: 3 .  fluticasone (FLONASE) 50 MCG/ACT nasal spray, Place 2 sprays into both nostrils daily., Disp: 16 g, Rfl: 6 .  glucose blood (ACCU-CHEK AVIVA PLUS) test strip, CHECK BLOOD SUGAR FIRST  THING IN THE MORNING BEFORE EATING, AFTER LUNCH, AND  AFTER DINNER TOTAL 3 TIMES  DAILY, Disp: 300 each, Rfl: 3 .  insulin aspart (NOVOLOG FLEXPEN) 100 UNIT/ML FlexPen, Inject 20 units into the skin three times daily  before meals (Patient taking differently: Inject 9-18 Units into the skin See admin instructions. Inject 15 units subcutaneously daily with breakfast, inject 9 units with lunch as needed for CBG 180 or more, and inject 9 units with supper), Disp: 15 pen, Rfl: 3 .  Insulin Glargine (LANTUS SOLOSTAR) 100 UNIT/ML Solostar Pen, INJECT 70 UNITS INTO THE SKIN EVERY MORNING., Disp: 15 pen, Rfl: 2 .  loratadine (CLARITIN) 10 MG tablet, Take 10 mg by mouth daily., Disp: , Rfl:  .  lovastatin (MEVACOR) 40 MG tablet, Take 1 tablet (40 mg total) by mouth daily with supper., Disp: 90 tablet, Rfl: 3 .  meclizine (ANTIVERT) 12.5 MG tablet, Take 1 tablet (12.5 mg total) by mouth 3 (three) times daily as needed for dizziness., Disp: 30 tablet, Rfl: 0 .  metFORMIN (GLUCOPHAGE-XR) 500 MG 24 hr tablet, TAKE 1 TABLET TWICE A DAY BEFORE MEALS, Disp: 180 tablet, Rfl: 3 .  metoCLOPramide (REGLAN) 5 MG tablet, TAKE 1 TABLET 30 MIN BEFORE MEALS AND 1 TABLET AT BEDTIME. FOUR TIMES/DAY, Disp: , Rfl: 4 .  Multiple Vitamin (MULTIVITAMIN WITH MINERALS) TABS tablet, Take 1 tablet by mouth daily., Disp: , Rfl:  .  OVER THE COUNTER MEDICATION, Place 1 drop into both eyes daily. Over the counter preservative free lubricating eye drops, Disp: , Rfl:  .  pantoprazole (PROTONIX) 40 MG tablet, Take 40 mg by mouth 2 (two) times daily. , Disp: ,  Rfl:  .  Probiotic Product (PROBIOTIC PEARLS) CAPS, Take 1 capsule by mouth daily with lunch. , Disp: , Rfl:  .  ranitidine (ZANTAC) 150 MG tablet, Take 150 mg by mouth daily as needed for heartburn (acid reflux). , Disp: , Rfl:  .  sodium chloride (OCEAN) 0.65 % SOLN nasal spray, Place 1 spray into both nostrils daily., Disp: , Rfl:  .  spironolactone (ALDACTONE) 25 MG tablet, TAKE 1 TABLET BY MOUTH EVERY DAY, Disp: 30 tablet, Rfl: 2 .  torsemide (DEMADEX) 20 MG tablet, Take 1 tablet (20 mg total) by mouth every other day., Disp: 30 tablet, Rfl: 5 .  vitamin E 400 UNIT capsule, Take 400 Units by  mouth daily., Disp: , Rfl:  .  busPIRone (BUSPAR) 10 MG tablet, TAKE 1 TABLET BY MOUTH TWICE A DAY, Disp: 60 tablet, Rfl: 2 .  carvedilol (COREG) 3.125 MG tablet, TAKE 1 TABLET (3.125 MG TOTAL) BY MOUTH 2 (TWO) TIMES DAILY WITH A MEAL., Disp: 60 tablet, Rfl: 3  Allergies  Allergen Reactions  . Baclofen Other (See Comments)    Caused leg weakness  . Hctz [Hydrochlorothiazide] Other (See Comments)    Uric acid elevation and gout.     . Lotensin [Benazepril] Other (See Comments)    Dry cough   . Victoza [Liraglutide] Other (See Comments)    Tongue Glossitus  . Acyclovir And Related Diarrhea  . Beta Adrenergic Blockers Other (See Comments)    Occurred with metoprolol  REACTION: coughing  . Losartan Diarrhea    Objective:  Vascular Examination: Capillary refill time immediate x 10 digits Dorsalis pedis and Posterior tibial pulses palpable b/l Digital hair present x 10 digits Skin temperature gradient WNL b/l  Dermatological Examination: Skin with normal turgor, texture and tone b/l  Toenails 1-5 b/l discolored, thick, dystrophic with subungual debris and pain with palpation to nailbeds due to thickness of nails.  Musculoskeletal: Muscle strength 5/5 to all LE muscle groups  Neurological: Sensation intact with 10 gram monofilament. Vibratory sensation intact.  Assessment: Painful onychomycosis toenails 1-5 b/l   Plan: 1. Toenails 1-5 b/l were debrided in length and girth without iatrogenic bleeding. 2. Patient to continue soft, supportive shoe gear 3. Patient to report any pedal injuries to medical professional immediately. 4. Follow up 3 months. Patient/POA to call should there be a concern in the interim.

## 2018-12-05 ENCOUNTER — Other Ambulatory Visit: Payer: Self-pay | Admitting: *Deleted

## 2018-12-05 MED ORDER — ALLOPURINOL 100 MG PO TABS: 200.0000 mg | ORAL_TABLET | Freq: Every day | ORAL | 8 refills | Status: DC

## 2018-12-08 ENCOUNTER — Other Ambulatory Visit: Payer: Self-pay

## 2018-12-08 ENCOUNTER — Telehealth: Payer: Self-pay | Admitting: *Deleted

## 2018-12-08 DIAGNOSIS — Z794 Long term (current) use of insulin: Secondary | ICD-10-CM

## 2018-12-08 DIAGNOSIS — E118 Type 2 diabetes mellitus with unspecified complications: Secondary | ICD-10-CM

## 2018-12-08 MED ORDER — INSULIN ASPART 100 UNIT/ML FLEXPEN: 9.0000 [IU] | PEN_INJECTOR | SUBCUTANEOUS | Status: DC

## 2018-12-08 NOTE — Telephone Encounter (Signed)
Patient had an eye doctor appointment and he told the patient that the air from the cpap caused her dry eye condition. Patient would like to know if she should stop wearing her cpap for a little while to see if her condition improves or to discontinue her sleep therapy. Patient states she needs her cpap but at the same time the air is drying out her corneas. Patient states from last year to the present time her visiond has changed dramatically.

## 2018-12-08 NOTE — Telephone Encounter (Signed)
What mask is she using? 

## 2018-12-11 NOTE — Telephone Encounter (Signed)
Patient is using a full face mask because at her sleep study her mouth was open so the sleep lab tech recommended the full face mask with the chin strap. She is also wearing a eye mask with everything else.

## 2018-12-14 ENCOUNTER — Other Ambulatory Visit: Payer: Self-pay

## 2018-12-14 DIAGNOSIS — Z794 Long term (current) use of insulin: Secondary | ICD-10-CM

## 2018-12-14 DIAGNOSIS — E118 Type 2 diabetes mellitus with unspecified complications: Secondary | ICD-10-CM

## 2018-12-14 MED ORDER — INSULIN ASPART 100 UNIT/ML FLEXPEN: 9.0000 [IU] | PEN_INJECTOR | SUBCUTANEOUS | 3 refills | Status: DC

## 2018-12-14 NOTE — Telephone Encounter (Signed)
Please make an appt with [facility] to discuss problem and see if there is another mask that may cause less eye dryness

## 2018-12-15 ENCOUNTER — Other Ambulatory Visit: Payer: Self-pay

## 2018-12-15 ENCOUNTER — Encounter: Payer: Self-pay | Admitting: Family Medicine

## 2018-12-15 ENCOUNTER — Ambulatory Visit: Payer: Medicare Other | Admitting: Family Medicine

## 2018-12-15 VITALS — BP 128/62 | HR 69 | Temp 98.4°F | Ht 60.0 in | Wt 197.6 lb

## 2018-12-15 DIAGNOSIS — H04121 Dry eye syndrome of right lacrimal gland: Secondary | ICD-10-CM

## 2018-12-15 DIAGNOSIS — R531 Weakness: Secondary | ICD-10-CM

## 2018-12-15 DIAGNOSIS — R7989 Other specified abnormal findings of blood chemistry: Secondary | ICD-10-CM | POA: Diagnosis not present

## 2018-12-15 DIAGNOSIS — I110 Hypertensive heart disease with heart failure: Secondary | ICD-10-CM | POA: Diagnosis not present

## 2018-12-15 DIAGNOSIS — I5032 Chronic diastolic (congestive) heart failure: Secondary | ICD-10-CM

## 2018-12-15 DIAGNOSIS — I10 Essential (primary) hypertension: Secondary | ICD-10-CM

## 2018-12-15 DIAGNOSIS — R946 Abnormal results of thyroid function studies: Secondary | ICD-10-CM | POA: Diagnosis not present

## 2018-12-15 HISTORY — DX: Weakness: R53.1

## 2018-12-15 MED ORDER — TORSEMIDE 10 MG PO TABS: 10.0000 mg | ORAL_TABLET | ORAL | 11 refills | Status: DC

## 2018-12-15 MED ORDER — TORSEMIDE 20 MG PO TABS: 10.0000 mg | ORAL_TABLET | ORAL | 5 refills | Status: DC

## 2018-12-15 MED ORDER — VALSARTAN 320 MG PO TABS: 320.0000 mg | ORAL_TABLET | Freq: Every day | ORAL | 11 refills | Status: DC

## 2018-12-15 NOTE — Assessment & Plan Note (Signed)
Will prescribe valsartan 320 mg and stop the patient's candesartan.

## 2018-12-15 NOTE — Patient Instructions (Signed)
It was nice seeing you today Ms. Thau!  For your leg weakness, let us try to go down on your torsemide to 10 mg every other day.  I am also checking your TSH and thyroid levels today.  I will call you if these results are abnormal.  For your vision changes, I am checking a lab that could explain this.  However, your eye doctor is likely the best person to help you with your dry eyes.  For your blood pressure, I am giving you a prescription for valsartan 320 mg once daily.  It is okay for your blood pressure to fluctuate during the day and to have some higher numbers, as long as they are not persistently elevated.  I would like to see you back in about 2 months.  If you have any questions or concerns, please feel free to call the clinic.   Be well,  Dr. Shan Levans

## 2018-12-15 NOTE — Assessment & Plan Note (Signed)
There are many possible etiologies of patient's weakness, and labs were performed earlier this month to investigate several causes.  She does not have anemia or electrolyte abnormalities.  Her TSH was elevated at 20 in October, so we will recheck her TSH and also check a free T4 and T3 to see if this could be causing her weakness.

## 2018-12-15 NOTE — Progress Notes (Signed)
Subjective:    Rebecca Walter - 77 y.o. female MRN [id number]  Date of birth: Dec 05, 1941  CC:  Rebecca Walter is here to discuss her weakness.  HPI: Leg weakness - worst in the mornings - feels like it is due to dehydration -Has been taking torsemide 10 mg daily rather than her previous 20 mg daily (although her prescription says to take this medication 20 mg QOD)  Vision changes - her eye doctor told her that her right cornea is not working well - her eye drops made her blood pressure go up to 063-016 systolic, so she stopped taking them - needs preservative free eye drops and is currently taking some that fit this description - is worried that her torsemide is causing her dehydration - has blurry vision from her right eye that her eye doctor said was due to dry eye - wears an eye mask to protect her   Blood pressure - BP has been normal in the mornings (120s/80s) but has been higher in the evenings, which makes her worried - her insurance company told her that candesartan will no longer be covered, so she would like a prescription for a different angiotensin receptor blocker  Health Maintenance:  Health Maintenance Due  Topic Date Due  . OPHTHALMOLOGY EXAM  07/12/2018  . FOOT EXAM  11/21/2018    -  reports that she has never smoked. She has never used smokeless tobacco. - Review of Systems: Per HPI. - Past Medical History: Patient Active Problem List   Diagnosis Date Noted  . Generalized weakness 12/15/2018  . Chronically dry eyes, right 12/15/2018  . Hypertension 10/17/2018  . Elevated TSH 11/21/2017  . Hypertensive heart disease with CHF (congestive heart failure) ([place])   . Chronic diastolic CHF (congestive heart failure) ([employer]) 07/15/2017  . Mild aortic stenosis 06/19/2017  . Mild mitral regurgitation 06/19/2017  . Moderate pulmonary valve insufficiency 06/19/2017  . Lumbar back pain with radiculopathy affecting right lower extremity 10/29/2016  . Allergic  rhinitis 03/22/2011  . INSOMNIA 09/16/2009  . GASTROPARESIS 08/12/2009  . Anxiety 10/18/2008  . DYSKINESIA, ESOPHAGUS 09/13/2007  . CARCINOMA, BASAL CELL 05/26/2007  . Diabetes mellitus, type II ([place]) 02/16/2007  . HLD (hyperlipidemia) 02/16/2007  . OBESITY, NOS 02/16/2007  . GASTROESOPHAGEAL REFLUX, NO ESOPHAGITIS 02/16/2007  . OSTEOARTHRITIS, MULTI SITES 02/16/2007  . Osteopenia after menopause 02/16/2007  . Gout 02/16/2007   - Medications: reviewed and updated   Objective:   Physical Exam BP 128/62   Pulse 69   Temp 98.4 F (36.9 C) (Oral)   Ht 5' (1.524 m)   Wt 197 lb 9.6 oz (89.6 kg)   LMP  (LMP Unknown)   SpO2 95%   BMI 38.59 kg/m  Gen: NAD, alert, cooperative with exam, well-appearing HEENT: Scleral injection, R > L, moist mucous membranes CV: RRR, good S1/S2, no murmur Resp: CTABL, no wheezes, non-labored Psych: good insight, alert and oriented, mildly anxious affect        Assessment & Plan:   Generalized weakness There are many possible etiologies of patient's weakness, and labs were performed earlier this month to investigate several causes.  She does not have anemia or electrolyte abnormalities.  Her TSH was elevated at 20 in October, so we will recheck her TSH and also check a free T4 and T3 to see if this could be causing her weakness.  Hypertension Will prescribe valsartan 320 mg and stop the patient's candesartan.  Chronically dry eyes, right Will obtain anti-Ro  antibodies to screen for Sjogren's syndrome.  Encouraged patient to follow-up with her ophthalmologist regarding her dry eyes.  We also decreased patient's torsemide to 10 mg every other day.  I reassured patient that torsemide is not required to keep her kidneys functioning, since her kidneys are functioning normally for her age according to her last BMP.      Maia Breslow, M.D. 12/15/2018, 3:49 PM PGY-2, [place]

## 2018-12-15 NOTE — Assessment & Plan Note (Signed)
Will obtain anti-Ro antibodies to screen for Sjogren's syndrome.  Encouraged patient to follow-up with her ophthalmologist regarding her dry eyes.  We also decreased patient's torsemide to 10 mg every other day.  I reassured patient that torsemide is not required to keep her kidneys functioning, since her kidneys are functioning normally for her age according to her last BMP.

## 2018-12-16 LAB — T3, FREE: T3, Free: 2.9 pg/mL (ref 2.0–4.4)

## 2018-12-16 LAB — TSH: TSH: 10.25 u[IU]/mL — ABNORMAL HIGH (ref 0.450–4.500)

## 2018-12-16 LAB — T4, FREE: Free T4: 0.93 ng/dL (ref 0.82–1.77)

## 2018-12-16 LAB — SJOGRENS SYNDROME-A EXTRACTABLE NUCLEAR ANTIBODY: ENA SSA (RO) Ab: <0.2 AI (ref 0.0–0.9)

## 2018-12-18 ENCOUNTER — Telehealth: Payer: Self-pay | Admitting: Family Medicine

## 2018-12-18 NOTE — Telephone Encounter (Signed)
Pt is calling and would like for Dr. Shan Levans  to call her with the results from her appointment last week and discuss changes they have made with medications.

## 2018-12-18 NOTE — Telephone Encounter (Signed)
Called patient about lab results. TSH elevated but T3 and T4 normal. Anti-Ro antibody normal. She states her leg weakness is better since decreasing her torsemide. States her BP has been slightly elevated in the evenings but is unsure if her BP machine is accurate. No CP, SOB, headaches. Suggested she continue all of her medications as prescribed and stop by for a BP check and bring her machine with her to check it as well. Advised her to keep a record of her BP and f/u with Dr. Shan Levans in a few weeks. Return and emergency precautions reviewed.

## 2018-12-19 NOTE — Telephone Encounter (Signed)
Reached out to [facility] spoke to Maudie Mercury who took a message to give to [place] on Thursday 1/2/20to call patient to set up an appointment to change her mask.

## 2018-12-28 ENCOUNTER — Other Ambulatory Visit: Payer: Self-pay | Admitting: Family Medicine

## 2018-12-28 DIAGNOSIS — I10 Essential (primary) hypertension: Secondary | ICD-10-CM

## 2018-12-29 ENCOUNTER — Other Ambulatory Visit: Payer: Self-pay

## 2018-12-29 ENCOUNTER — Encounter: Payer: Self-pay | Admitting: Family Medicine

## 2018-12-29 ENCOUNTER — Ambulatory Visit: Payer: Medicare Other | Admitting: Family Medicine

## 2018-12-29 VITALS — BP 125/59 | HR 65 | Temp 98.2°F | Wt 190.0 lb

## 2018-12-29 DIAGNOSIS — I10 Essential (primary) hypertension: Secondary | ICD-10-CM

## 2018-12-29 DIAGNOSIS — E1143 Type 2 diabetes mellitus with diabetic autonomic (poly)neuropathy: Secondary | ICD-10-CM

## 2018-12-29 DIAGNOSIS — Z794 Long term (current) use of insulin: Secondary | ICD-10-CM

## 2018-12-29 LAB — POCT GLYCOSYLATED HEMOGLOBIN (HGB A1C): HbA1c, POC (controlled diabetic range): 6.5 % (ref 0.0–7.0)

## 2018-12-29 NOTE — Progress Notes (Signed)
Subjective:    Rebecca Walter - 78 y.o. female MRN [id number]  Date of birth: 04-08-1941  CC:  Rebecca Walter is here for follow-up of blood pressure and diabetes.  HPI: Blood pressure -Patient reports systolic pressure was 993 last night but was 152 this morning, which makes her worried - systolic pressures have averaged in the 570V and and diastolic pressures in the 70s -She will sometimes take a leftover amlodipine 5 mg or 2.5 mg from a previous prescription if her blood pressure is elevated at night, even though she takes amlodipine 10 mg daily -Reports no lightheadedness, headaches, blurry vision  Diabetes mellitus, well controlled -Patient would like for me to call her insurance to change her insulin from tier 3 to tier 2 -Would like an A1c drawn today, even though it has been less than 3 months since her last measurement  Health Maintenance:  Health Maintenance Due  Topic Date Due  . OPHTHALMOLOGY EXAM  07/12/2018    -  reports that she has never smoked. She has never used smokeless tobacco. - Review of Systems: Per HPI. - Past Medical History: Patient Active Problem List   Diagnosis Date Noted  . Generalized weakness 12/15/2018  . Chronically dry eyes, right 12/15/2018  . Hypertension 10/17/2018  . Elevated TSH 11/21/2017  . Hypertensive heart disease with CHF (congestive heart failure) ([place])   . Chronic diastolic CHF (congestive heart failure) ([place]) 07/15/2017  . Mild aortic stenosis 06/19/2017  . Mild mitral regurgitation 06/19/2017  . Moderate pulmonary valve insufficiency 06/19/2017  . Lumbar back pain with radiculopathy affecting right lower extremity 10/29/2016  . Allergic rhinitis 03/22/2011  . INSOMNIA 09/16/2009  . GASTROPARESIS 08/12/2009  . Anxiety 10/18/2008  . DYSKINESIA, ESOPHAGUS 09/13/2007  . CARCINOMA, BASAL CELL 05/26/2007  . Diabetes mellitus, type II ([place]) 02/16/2007  . HLD (hyperlipidemia) 02/16/2007  . OBESITY, NOS 02/16/2007  .  GASTROESOPHAGEAL REFLUX, NO ESOPHAGITIS 02/16/2007  . OSTEOARTHRITIS, MULTI SITES 02/16/2007  . Osteopenia after menopause 02/16/2007  . Gout 02/16/2007   - Medications: reviewed and updated   Objective:   Physical Exam BP (!) 125/59   Pulse 65   Temp 98.2 F (36.8 C) (Oral)   Wt 190 lb (86.2 kg)   LMP  (LMP Unknown)   SpO2 97%   BMI 37.11 kg/m  Gen: NAD, alert, cooperative with exam, well-appearing CV: RRR, good S1/S2, no murmur, trace edema Resp: CTABL, no wheezes, non-labored Abd: SNTND, BS present, no guarding or organomegaly Skin: no rashes, normal turgor  Neuro: no gross deficits.  Psych: alert and oriented, mildly anxious affect  Diabetic Foot Exam - Simple   Simple Foot Form Diabetic Foot exam was performed with the following findings:  Yes 12/29/2018  2:45 PM  Visual Inspection No deformities, no ulcerations, no other skin breakdown bilaterally:  Yes Sensation Testing Intact to touch and monofilament testing bilaterally:  Yes Pulse Check Posterior Tibialis and Dorsalis pulse intact bilaterally:  Yes Comments          Assessment & Plan:   Hypertension Reviewed with patient in detail that her pulse pressure is wider than the average person's because vessels are less elastic as we age.  Reassured patient that her blood pressures are overall within normal limits.  Also explained to her that amlodipine is only approved for 10 mg/day as the highest dose, so she should not take more of this medication as needed.  We will continue patient's current blood pressure regimen.  Diabetes mellitus, type  II ([place]) Patient's A1c continues to be well controlled today at 6.5.  Reviewed with patient that data shows that there is greater mortality with patient's her age having A1c's less than 7, and we may be able to decrease or eliminate her insulin, which would save her money as well as hassle.  She likes that her blood sugar is tightly controlled, but I think she is amenable to  considering reduction of her insulin dose in the future.  I will plan to call her insurance company regarding her request in the coming week.    Maia Breslow, M.D. 12/31/2018, 12:33 PM PGY-2, [place]

## 2018-12-29 NOTE — Patient Instructions (Signed)
It was nice seeing you today Ms. Ksiazek!  Today we will check a hemoglobin A1c, and we will continue your blood pressure medications as they are.  We are also going to to continue your torsemide 10 mg every other day.  I am glad you are doing well.  Please return in about 3 months for follow-up on your diabetes and hypertension.  I will call you with your hemoglobin A1c result and we will discuss your insulin at that time.  I will work on the prior authorization for your NovoLog as well.  If you have any questions or concerns, please feel free to call the clinic.   Be well,  Dr. Shan Levans

## 2018-12-31 NOTE — Assessment & Plan Note (Signed)
Reviewed with patient in detail that her pulse pressure is wider than the average person's because vessels are less elastic as we age.  Reassured patient that her blood pressures are overall within normal limits.  Also explained to her that amlodipine is only approved for 10 mg/day as the highest dose, so she should not take more of this medication as needed.  We will continue patient's current blood pressure regimen.

## 2018-12-31 NOTE — Assessment & Plan Note (Addendum)
Patient's A1c continues to be well controlled today at 6.5.  Reviewed with patient that data shows that there is greater mortality with patient's her age having A1c's less than 7, and we may be able to decrease or eliminate her insulin, which would save her money as well as hassle.  She likes that her blood sugar is tightly controlled, but I think she is amenable to considering reduction of her insulin dose in the future.  I will plan to call her insurance company regarding her request in the coming week.

## 2019-01-01 ENCOUNTER — Other Ambulatory Visit: Payer: Self-pay

## 2019-01-01 MED ORDER — INSULIN PEN NEEDLE 31G X 8 MM MISC: 5 refills | Status: DC

## 2019-01-03 ENCOUNTER — Other Ambulatory Visit: Payer: Self-pay | Admitting: Family Medicine

## 2019-01-03 NOTE — Telephone Encounter (Signed)
I called [facility] as the patient requested to see if we could get her NovoLog from tier 3 to tier 2.  The representative from [facility] said that Humalog is on their formulary and would be much more affordable for the patient.  I called the patient to let her know about this, but she wants to continue with the NovoLog because Humalog did not work for her.  I told the patient that that is the exact same medication as NovoLog, but she reiterated that Humalog does not work for her.  I also let patient know that her A1c was 6.5, which is very good, but that she would likely benefit in the long-term from allowing her A1c to be a bit higher as long as it was under 8.0 given her age.  Patient said that she wants to continue with very tight control of her glucose even with my recommendations.  I told the patient that I would try to call [facility] again to get this preauthorization, but I cannot promise that it would be successful, and I cannot promise that I can do it in the next few days.  She understands and is agreeable to this plan.

## 2019-01-04 ENCOUNTER — Telehealth: Payer: Self-pay | Admitting: Family Medicine

## 2019-01-04 NOTE — Telephone Encounter (Signed)
Called [facility] and sent in the prior authorization for NovoLog at patient's request.  I told him that she has failed Humalog in the past.  We will receive a fax when a decision has been made.

## 2019-01-05 ENCOUNTER — Encounter (HOSPITAL_COMMUNITY): Payer: Self-pay | Admitting: Emergency Medicine

## 2019-01-05 ENCOUNTER — Ambulatory Visit (HOSPITAL_COMMUNITY)
Admission: EM | Admit: 2019-01-05 | Discharge: 2019-01-05 | Disposition: A | Discharge status: Home / Self Care | Payer: Medicare Other | Attending: Internal Medicine | Admitting: Internal Medicine

## 2019-01-05 ENCOUNTER — Ambulatory Visit (INDEPENDENT_AMBULATORY_CARE_PROVIDER_SITE_OTHER): Payer: Medicare Other

## 2019-01-05 DIAGNOSIS — R0789 Other chest pain: Secondary | ICD-10-CM | POA: Diagnosis not present

## 2019-01-05 DIAGNOSIS — Y92009 Unspecified place in unspecified non-institutional (private) residence as the place of occurrence of the external cause: Secondary | ICD-10-CM

## 2019-01-05 DIAGNOSIS — S2001XA Contusion of right breast, initial encounter: Secondary | ICD-10-CM

## 2019-01-05 DIAGNOSIS — W01190A Fall on same level from slipping, tripping and stumbling with subsequent striking against furniture, initial encounter: Secondary | ICD-10-CM | POA: Diagnosis not present

## 2019-01-05 DIAGNOSIS — R0781 Pleurodynia: Secondary | ICD-10-CM | POA: Diagnosis not present

## 2019-01-05 DIAGNOSIS — S20211A Contusion of right front wall of thorax, initial encounter: Secondary | ICD-10-CM | POA: Diagnosis not present

## 2019-01-05 DIAGNOSIS — S299XXA Unspecified injury of thorax, initial encounter: Secondary | ICD-10-CM | POA: Diagnosis not present

## 2019-01-05 DIAGNOSIS — W19XXXA Unspecified fall, initial encounter: Secondary | ICD-10-CM

## 2019-01-05 NOTE — Discharge Instructions (Signed)
Continue using ice on areas of pain for 48 and you may take Tylenol 500 mg up to 2 at a time if one is not enough every 8 hours.  Hold a pillow and take deep breaths several times a day to prevent pneumonia.  See your family Dr  next week for follow up.

## 2019-01-05 NOTE — ED Provider Notes (Signed)
[place]    CSN: [id number] Arrival date & time: 01/05/19  1335     History   Chief Complaint Chief Complaint  Patient presents with  . Fall    HPI Florene C Vandehei is a 78 y.o. female.   Pt grabbed something from her kitchen counter and turned to her R to walk and her legs gave away and tried to catch herself to not fall and fell over her rocker and hit her R breast and chest with the arm of the rocker. She tried heat right away, but the pain got worse, so she switched to ice and has taken Tylenol 325 mg for pain twice so far. Pain is provoked with palpation and deep breaths. Denies hitting her head or LOC. Denies injuring herself anywhere else.     Past Medical History:  Diagnosis Date  . Anxiety   . Arthritis   . Chronic diastolic CHF (congestive heart failure) ([place])   . Diabetes mellitus   . GERD (gastroesophageal reflux disease)   . Gout   . Hyperlipidemia   . Hypertension   . Hypertensive heart disease with CHF (congestive heart failure) ([place])   . Mild aortic stenosis 06/2017  . Mild mitral regurgitation 06/2017  . Moderate pulmonary valve insufficiency 06/2017  . Osteopenia     Patient Active Problem List   Diagnosis Date Noted  . Generalized weakness 12/15/2018  . Chronically dry eyes, right 12/15/2018  . Hypertension 10/17/2018  . Elevated TSH 11/21/2017  . Hypertensive heart disease with CHF (congestive heart failure) ([place])   . Chronic diastolic CHF (congestive heart failure) ([place]) 07/15/2017  . Mild aortic stenosis 06/19/2017  . Mild mitral regurgitation 06/19/2017  . Moderate pulmonary valve insufficiency 06/19/2017  . Lumbar back pain with radiculopathy affecting right lower extremity 10/29/2016  . Allergic rhinitis 03/22/2011  . INSOMNIA 09/16/2009  . GASTROPARESIS 08/12/2009  . Anxiety 10/18/2008  . DYSKINESIA, ESOPHAGUS 09/13/2007  . CARCINOMA, BASAL CELL 05/26/2007  . Diabetes mellitus, type II ([place]) 02/16/2007  . HLD  (hyperlipidemia) 02/16/2007  . OBESITY, NOS 02/16/2007  . GASTROESOPHAGEAL REFLUX, NO ESOPHAGITIS 02/16/2007  . OSTEOARTHRITIS, MULTI SITES 02/16/2007  . Osteopenia after menopause 02/16/2007  . Gout 02/16/2007    Past Surgical History:  Procedure Laterality Date  . KIDNEY SURGERY    . KNEE SURGERY    . MENISECTOMY    . WRIST SURGERY      OB History   No obstetric history on file.      Home Medications    Prior to Admission medications   Medication Sig Start Date End Date Taking? Authorizing Provider  allopurinol (ZYLOPRIM) 100 MG tablet Take 2 tablets (200 mg total) by mouth daily. 12/05/18  Yes [name], Alcario Drought, MD  amLODipine (NORVASC) 10 MG tablet Take 1 tablet (10 mg total) by mouth daily. 08/31/18  Yes Nuala Alpha, DO  aspirin 81 MG chewable tablet Chew 1 tablet (81 mg total) by mouth daily. 03/22/11  Yes Cletus Gash, MD  busPIRone (BUSPAR) 10 MG tablet TAKE 1 TABLET BY MOUTH TWICE A DAY 11/28/18  Yes Kathrene Alu, MD  candesartan (ATACAND) 32 MG tablet TAKE 1 TABLET (32 MG TOTAL) BY MOUTH AT BEDTIME 06/05/18  Yes Nicolette Bang, DO  carvedilol (COREG) 3.125 MG tablet TAKE 1 TABLET (3.125 MG TOTAL) BY MOUTH 2 (TWO) TIMES DAILY WITH A MEAL. 11/30/18  Yes [name], Alcario Drought, MD  fluticasone (FLONASE) 50 MCG/ACT nasal spray Place 2 sprays into both nostrils daily.  04/21/18  Yes Nicolette Bang, DO  insulin aspart (NOVOLOG FLEXPEN) 100 UNIT/ML FlexPen Inject 9-18 Units into the skin See admin instructions. Inject 15 units subcutaneously daily with breakfast, inject 9 units with lunch as needed for CBG 180 or more, and inject 9 units with supper 12/14/18  Yes Alveda Reasons, MD  Insulin Glargine (LANTUS SOLOSTAR) 100 UNIT/ML Solostar Pen INJECT 70 UNITS INTO THE SKIN EVERY MORNING. 11/06/18  Yes [name], Alcario Drought, MD  Insulin Pen Needle (B-D ULTRAFINE III SHORT PEN) 31G X 8 MM MISC Use to inject insulin 4 times daily or as directed. 01/01/19  Yes  [name], Alcario Drought, MD  loratadine (CLARITIN) 10 MG tablet Take 10 mg by mouth daily.   Yes [provider]  lovastatin (MEVACOR) 40 MG tablet Take 1 tablet (40 mg total) by mouth daily with supper. 06/30/18  Yes [name], Alcario Drought, MD  metFORMIN (GLUCOPHAGE-XR) 500 MG 24 hr tablet TAKE 1 TABLET TWICE A DAY BEFORE MEALS 08/22/18  Yes [name], Alcario Drought, MD  metoCLOPramide (REGLAN) 5 MG tablet TAKE 1 TABLET 30 MIN BEFORE MEALS AND 1 TABLET AT BEDTIME. FOUR TIMES/DAY 09/04/17  Yes [provider]  Multiple Vitamin (MULTIVITAMIN WITH MINERALS) TABS tablet Take 1 tablet by mouth daily.   Yes [provider]  pantoprazole (PROTONIX) 40 MG tablet Take 40 mg by mouth 2 (two) times daily.    Yes [provider]  Probiotic Product (PROBIOTIC PEARLS) CAPS Take 1 capsule by mouth daily with lunch.    Yes [provider]  ranitidine (ZANTAC) 150 MG tablet Take 150 mg by mouth daily as needed for heartburn (acid reflux).    Yes [provider]  spironolactone (ALDACTONE) 25 MG tablet TAKE 1 TABLET BY MOUTH EVERY DAY 12/28/18  Yes [name], Alcario Drought, MD  torsemide (DEMADEX) 10 MG tablet Take 1 tablet (10 mg total) by mouth every other day. 12/15/18  Yes [name], Alcario Drought, MD  vitamin E 400 UNIT capsule Take 400 Units by mouth daily.   Yes [provider]  acetaminophen (TYLENOL) 325 MG tablet Take 325 mg by mouth every 8 (eight) hours as needed (pain).     [provider]  alum & mag hydroxide-simeth (MAALOX/MYLANTA) 200-200-20 MG/5ML suspension Take 30 mLs by mouth every 6 (six) hours as needed for indigestion or heartburn. 07/20/17   Nicolette Bang, DO  azelastine (ASTELIN) 0.1 % nasal spray Place 1 spray into both nostrils 2 (two) times daily. Use in each nostril as directed 09/18/18   Zenia Resides, MD  Blood Glucose Monitoring Suppl (ACCU-CHEK AVIVA PLUS) w/Device KIT 1 Device by Does not apply route daily. 11/02/18   Kathrene Alu,  MD  Camphor-Eucalyptus-Menthol (VICKS VAPORUB EX) Place 1 application into both nostrils daily as needed (congestion).    [provider]  glucose blood (ACCU-CHEK AVIVA PLUS) test strip CHECK BLOOD SUGAR FIRST  THING IN THE MORNING BEFORE EATING, AFTER LUNCH, AND  AFTER DINNER TOTAL 3 TIMES  DAILY 06/14/18   Nicolette Bang, DO  meclizine (ANTIVERT) 12.5 MG tablet Take 1 tablet (12.5 mg total) by mouth 3 (three) times daily as needed for dizziness. 09/12/18   Robyn Haber, MD  OVER THE COUNTER MEDICATION Place 1 drop into both eyes daily. Over the counter preservative free lubricating eye drops    [provider]  sodium chloride (OCEAN) 0.65 % SOLN nasal spray Place 1 spray into both nostrils daily.    [provider]  torsemide (DEMADEX) 20 MG tablet Take 0.5 tablets (10 mg total) by mouth every other day. 12/15/18   Kathrene Alu, MD  valsartan (DIOVAN) 320 MG tablet Take 1 tablet (320 mg total) by mouth daily. 12/15/18   Kathrene Alu, MD    Family History Family History  Problem Relation Age of Onset  . Stroke Mother   . Heart disease Father   . Stroke Father   . Hypertension Father   . Cancer Sister   . Cancer Brother   . Heart disease Brother   . Diabetes Maternal Uncle     Social History Social History   Tobacco Use  . Smoking status: Never Smoker  . Smokeless tobacco: Never Used  Substance Use Topics  . Alcohol use: No  . Drug use: No     Allergies   Baclofen; Hctz [hydrochlorothiazide]; Lotensin [benazepril]; Victoza [liraglutide]; Acyclovir and related; Beta adrenergic blockers; and Losartan   Review of Systems Review of Systems  Constitutional: Negative for activity change and appetite change.  Respiratory: Negative for cough and shortness of breath.   Gastrointestinal: Negative for abdominal pain.  Genitourinary: Negative for difficulty urinating.  Skin: Positive for color change. Negative for rash and wound.   Neurological: Negative for dizziness and light-headedness.     Physical Exam Triage Vital Signs ED Triage Vitals [01/05/19 1433]  Enc Vitals Group     BP      Pulse Rate 67     Resp 16     Temp 98.8 F (37.1 C)     Temp Source Oral     SpO2 96 %     Weight      Height      Head Circumference      Peak Flow      Pain Score 5     Pain Loc      Pain Edu?      Excl. in [place]?    No data found.  Updated Vital Signs Pulse 67   Temp 98.8 F (37.1 C) (Oral)   Resp 16   LMP  (LMP Unknown)   SpO2 96%   Visual Acuity Right Eye Distance:   Left Eye Distance:   Bilateral Distance:    Right Eye Near:   Left Eye Near:    Bilateral Near:     Physical Exam Vitals signs and nursing note reviewed.  Constitutional:      General: She is not in acute distress.    Appearance: Normal appearance. She is obese. She is not toxic-appearing.     Comments: She is here with one family member  HENT:     Head: Normocephalic.     Nose: Nose normal.  Eyes:     General: No scleral icterus.    Conjunctiva/sclera: Conjunctivae normal.  Neck:     Musculoskeletal: Neck supple.  Cardiovascular:     Rate and Rhythm: Normal rate.     Heart sounds: No murmur.  Pulmonary:     Effort: Pulmonary effort is normal. No respiratory distress.     Breath sounds: No wheezing or rales.     Comments: Has tender ribs on R mid lateral chest wall area. No crepitations noted.  Chest:     Chest wall: Tenderness present.  Abdominal:     Tenderness: There is no abdominal tenderness.  Musculoskeletal: Normal range of motion.  Skin:    General: Skin is warm and dry.     Findings: Bruising present. No erythema or rash.  Comments: Has a bruised area on R breast laterally of about 3x3 cm, but there is no hard hematoma. Has faint bruise on lateral thorax over her ribs. This area is moderately tender  Neurological:     General: No focal deficit present.     Mental Status: She is alert and oriented to person,  place, and time.     Motor: No weakness.     Gait: Gait normal.  Psychiatric:        Mood and Affect: Mood normal.        Behavior: Behavior normal.        Thought Content: Thought content normal.        Judgment: Judgment normal.    UC Treatments / Results  Labs (all labs ordered are listed, but only abnormal results are displayed) Labs Reviewed - No data to display  EKG None  Radiology Dg Ribs Unilateral W/chest Right  Result Date: 01/05/2019 CLINICAL DATA:  Anterior right chest wall pain after falling yesterday. EXAM: RIGHT RIBS AND CHEST - 3+ VIEW COMPARISON:  Chest radiographs 04/08/2018 and 07/15/2017. FINDINGS: The heart size and mediastinal contours are stable. There is aortic atherosclerosis. Mild scarring at the left lung base is clear. There is no pleural effusion or pneumothorax. Apparent posttraumatic rib deformities bilaterally, similar to previous chest radiographs. No evidence of acute rib fracture. IMPRESSION: No evidence of acute rib fracture, pleural effusion or pneumothorax. Old rib fractures bilaterally. Electronically Signed   By: Richardean Sale M.D.   On: 01/05/2019 16:03    Procedures Procedures Medications Ordered in UC Medications - No data to display  Initial Impression / Assessment and Plan / UC Course  I have reviewed the triage vital signs and the nursing notes. Pertinent  imaging results that were available during my care of the patient were reviewed by me and considered in my medical decision making (see chart for details). She has contusion of chest wall and lateral R breast. She was educated how to care for this area, see instructions. Needs to Fu with PCP next week.     Final Clinical Impressions(s) / UC Diagnoses   Final diagnoses:  Fall in home, initial encounter  Contusion of right chest wall, initial encounter     Discharge Instructions     Continue using ice on areas of pain for 48 and you may take Tylenol 500 mg up to 2 at a time  if one is not enough every 8 hours.  Hold a pillow and take deep breaths several times a day to prevent pneumonia.  See your family Dr  next week for follow up.     ED Prescriptions    None     Controlled Substance Prescriptions [place] Controlled Substance Registry consulted?    Shelby Mattocks, [place] 01/05/19 2234

## 2019-01-05 NOTE — ED Triage Notes (Signed)
PT reports she fell yesterday and fell into her rocking chair. The arm of the rocking chair struck her ribs.

## 2019-01-11 ENCOUNTER — Ambulatory Visit: Payer: Medicare Other | Admitting: Family Medicine

## 2019-01-11 ENCOUNTER — Other Ambulatory Visit: Payer: Self-pay

## 2019-01-11 VITALS — BP 112/62 | HR 66 | Temp 97.6°F | Wt 196.0 lb

## 2019-01-11 DIAGNOSIS — R0789 Other chest pain: Secondary | ICD-10-CM | POA: Diagnosis not present

## 2019-01-11 DIAGNOSIS — W19XXXD Unspecified fall, subsequent encounter: Secondary | ICD-10-CM | POA: Diagnosis not present

## 2019-01-11 MED ORDER — INSULIN GLARGINE 100 UNIT/ML SOLOSTAR PEN: PEN_INJECTOR | SUBCUTANEOUS | 2 refills | Status: DC

## 2019-01-11 NOTE — Progress Notes (Signed)
    Subjective:    Patient ID: Rebecca Walter, female    DOB: January 27, 1941, 78 y.o.   MRN: [id number]   CC: follow up after fall  HPI: patient fell at home last Thursday in the kitchen and fell onto the arm of a rocking chair on her left side. She went to [facility] for evaluation, CXR did not demonstrate acute fracture or other abnormality. She was told to follow up with PCP for recheck and do deep breathing exercises. She reports since then she has been doing well for the most part. She takes 325 mg tylenol every 4-6 hours for pain. The pain is worse at night when trying to sleep. She uses an ice pack at night with some relief. She denies fevers, shortness of breath, cough. She denies abdominal pain or distention. She reports the fall was mechanical and she just lost her balance.   Smoking status reviewed- non-smoker  Review of Systems- see HPI   Objective:  BP 112/62   Pulse 66   Temp 97.6 F (36.4 C) (Oral)   Wt 196 lb (88.9 kg)   LMP  (LMP Unknown)   SpO2 98%   BMI 38.28 kg/m  Vitals and nursing note reviewed  General: well nourished, in no acute distress HEENT: normocephalic, MMM Cardiac: RRR, clear S1 and S2, no murmurs, rubs, or gallops. Bruise over left breast and left side of lower ribs Respiratory: clear to auscultation bilaterally, no increased work of breathing Abdomen: soft, nontender, nondistended, +BS Skin: warm and dry, no rashes noted, ecchymosis over left side of chest as above Neuro: alert and oriented, no focal deficits   Assessment & Plan:    1. Fall, subsequent encounter Patient doing well, bruising age consistent with timeline, minimally tender to touch. Her abdomen is soft, non-distended. She is doing incentive spirometry exercises at home. Tylenol is managing pain except for at night. Advised she can double dose at night to 650 mg every night. Continue ice packs as needed. Follow up with PCP as scheduled, return sooner if she has new or worrisome symptoms.  Patient verbalized understanding and agreement with plan.    Return in about 3 months (around 04/12/2019).   Lucila Maine, DO Family Medicine Resident PGY-3

## 2019-01-11 NOTE — Patient Instructions (Signed)
  Nice to see you today! Please continue taking tylenol for pain. Take double the dose at night. Continue ice packs as needed  If you feel short of breat or have fevers please return to be seen If you feel your stomach hurts worse instead of getting better or stomach feels distended please return to be seen.  Otherwise please return to see Dr. Shan Levans in 3 months.  If you have questions or concerns please do not hesitate to call at [phone].  Lucila Maine, DO PGY-3, [facility] 01/11/2019 2:18 PM

## 2019-01-17 DIAGNOSIS — H04123 Dry eye syndrome of bilateral lacrimal glands: Secondary | ICD-10-CM | POA: Diagnosis not present

## 2019-01-24 ENCOUNTER — Ambulatory Visit: Payer: Medicare Other | Admitting: Family Medicine

## 2019-01-24 ENCOUNTER — Encounter: Payer: Self-pay | Admitting: Family Medicine

## 2019-01-24 ENCOUNTER — Other Ambulatory Visit: Payer: Self-pay

## 2019-01-24 DIAGNOSIS — I5032 Chronic diastolic (congestive) heart failure: Secondary | ICD-10-CM | POA: Diagnosis not present

## 2019-01-24 DIAGNOSIS — I110 Hypertensive heart disease with heart failure: Secondary | ICD-10-CM | POA: Diagnosis not present

## 2019-01-24 DIAGNOSIS — R531 Weakness: Secondary | ICD-10-CM

## 2019-01-24 DIAGNOSIS — I10 Essential (primary) hypertension: Secondary | ICD-10-CM

## 2019-01-24 DIAGNOSIS — E1143 Type 2 diabetes mellitus with diabetic autonomic (poly)neuropathy: Secondary | ICD-10-CM | POA: Insufficient documentation

## 2019-01-24 NOTE — Assessment & Plan Note (Signed)
Restart torsemide

## 2019-01-24 NOTE — Assessment & Plan Note (Signed)
Symptom complex fits best with diabetic autonomic neuropathy.  I am especially concerned about low BP post BM.  She is at high risk for fall or syncope.   No change for now.  Check labs.  Likely decrease BP meds and tolerate higher systolic pressures

## 2019-01-24 NOTE — Assessment & Plan Note (Signed)
Watch for low diastolics.   She would be easy to overtreat.

## 2019-01-24 NOTE — Progress Notes (Signed)
Established Patient Office Visit  Subjective:  Patient ID: Rebecca Walter, female    DOB: [birth year]  Age: 78 y.o. MRN: [id number]  CC:  Chief Complaint  Patient presents with  . generalized weakness    HPI Rebecca Walter presents for diarrhea followed by extreme weakness.  Patient has longstanding bowel problems and has been treated for diabetic gastroparesis.  Also has HBP with wide pulse pressure and low diastolics.  Treated her diarrhea with immodium.  No abd pain.  No fever or consitutional symptoms beyond general weakness.  Denies focal weakness.  Has not been taking torsemide due to concern for dehydration.  Past Medical History:  Diagnosis Date  . Anxiety   . Arthritis   . Chronic diastolic CHF (congestive heart failure) ([place])   . Diabetes mellitus   . GERD (gastroesophageal reflux disease)   . Gout   . Hyperlipidemia   . Hypertension   . Hypertensive heart disease with CHF (congestive heart failure) ([place])   . Mild aortic stenosis 06/2017  . Mild mitral regurgitation 06/2017  . Moderate pulmonary valve insufficiency 06/2017  . Osteopenia     Past Surgical History:  Procedure Laterality Date  . KIDNEY SURGERY    . KNEE SURGERY    . MENISECTOMY    . WRIST SURGERY      Family History  Problem Relation Age of Onset  . Stroke Mother   . Heart disease Father   . Stroke Father   . Hypertension Father   . Cancer Sister   . Cancer Brother   . Heart disease Brother   . Diabetes Maternal Uncle     Social History   Socioeconomic History  . Marital status: Widowed    Spouse name: Not on file  . Number of children: 1  . Years of education: 53  . Highest education level: Not on file  Occupational History  . Occupation: retired-office work  Scientific laboratory technician  . Financial resource strain: Not on file  . Food insecurity:    Worry: Not on file    Inability: Not on file  . Transportation needs:    Medical: Not on file    Non-medical: Not on file  Tobacco Use    . Smoking status: Never Smoker  . Smokeless tobacco: Never Used  Substance and Sexual Activity  . Alcohol use: No  . Drug use: No  . Sexual activity: Not on file  Lifestyle  . Physical activity:    Days per week: Not on file    Minutes per session: Not on file  . Stress: Not on file  Relationships  . Social connections:    Talks on phone: Not on file    Gets together: Not on file    Attends religious service: Not on file    Active member of club or organization: Not on file    Attends meetings of clubs or organizations: Not on file    Relationship status: Not on file  . Intimate partner violence:    Fear of current or ex partner: Not on file    Emotionally abused: Not on file    Physically abused: Not on file    Forced sexual activity: Not on file  Other Topics Concern  . Not on file  Social History Narrative   Health Care POA:    Emergency Contact:    End of Life Plan:    Who lives with you: self   Any pets: none   Diet: Pt  has a varied diet of protein, starch, and vegetables.   Exercise: Pt reports walking daily to community trash cans.    Seatbelts: Pt reports wearing seatbelt when in vehicles.    Hobbies: growing flowers          Outpatient Medications Prior to Visit  Medication Sig Dispense Refill  . acetaminophen (TYLENOL) 325 MG tablet Take 325 mg by mouth every 8 (eight) hours as needed (pain).     Marland Kitchen allopurinol (ZYLOPRIM) 100 MG tablet Take 2 tablets (200 mg total) by mouth daily. 60 tablet 8  . alum & mag hydroxide-simeth (MAALOX/MYLANTA) 200-200-20 MG/5ML suspension Take 30 mLs by mouth every 6 (six) hours as needed for indigestion or heartburn. 355 mL 0  . amLODipine (NORVASC) 10 MG tablet Take 1 tablet (10 mg total) by mouth daily. 90 tablet 1  . aspirin 81 MG chewable tablet Chew 1 tablet (81 mg total) by mouth daily. 30 tablet 5  . azelastine (ASTELIN) 0.1 % nasal spray Place 1 spray into both nostrils 2 (two) times daily. Use in each nostril as  directed 30 mL 3  . Blood Glucose Monitoring Suppl (ACCU-CHEK AVIVA PLUS) w/Device KIT 1 Device by Does not apply route daily. 1 kit 3  . busPIRone (BUSPAR) 10 MG tablet TAKE 1 TABLET BY MOUTH TWICE A DAY 60 tablet 2  . Camphor-Eucalyptus-Menthol (VICKS VAPORUB EX) Place 1 application into both nostrils daily as needed (congestion).    . candesartan (ATACAND) 32 MG tablet TAKE 1 TABLET (32 MG TOTAL) BY MOUTH AT BEDTIME 30 tablet 3  . carvedilol (COREG) 3.125 MG tablet TAKE 1 TABLET (3.125 MG TOTAL) BY MOUTH 2 (TWO) TIMES DAILY WITH A MEAL. 60 tablet 3  . fluticasone (FLONASE) 50 MCG/ACT nasal spray Place 2 sprays into both nostrils daily. 16 g 6  . glucose blood (ACCU-CHEK AVIVA PLUS) test strip CHECK BLOOD SUGAR FIRST  THING IN THE MORNING BEFORE EATING, AFTER LUNCH, AND  AFTER DINNER TOTAL 3 TIMES  DAILY 300 each 3  . insulin aspart (NOVOLOG FLEXPEN) 100 UNIT/ML FlexPen Inject 9-18 Units into the skin See admin instructions. Inject 15 units subcutaneously daily with breakfast, inject 9 units with lunch as needed for CBG 180 or more, and inject 9 units with supper 15 mL 3  . Insulin Glargine (LANTUS SOLOSTAR) 100 UNIT/ML Solostar Pen INJECT 70 UNITS INTO THE SKIN EVERY MORNING. 15 pen 2  . Insulin Pen Needle (B-D ULTRAFINE III SHORT PEN) 31G X 8 MM MISC Use to inject insulin 4 times daily or as directed. 100 each 5  . loratadine (CLARITIN) 10 MG tablet Take 10 mg by mouth daily.    Marland Kitchen lovastatin (MEVACOR) 40 MG tablet Take 1 tablet (40 mg total) by mouth daily with supper. 90 tablet 3  . meclizine (ANTIVERT) 12.5 MG tablet Take 1 tablet (12.5 mg total) by mouth 3 (three) times daily as needed for dizziness. 30 tablet 0  . metFORMIN (GLUCOPHAGE-XR) 500 MG 24 hr tablet TAKE 1 TABLET TWICE A DAY BEFORE MEALS 180 tablet 3  . metoCLOPramide (REGLAN) 5 MG tablet TAKE 1 TABLET 30 MIN BEFORE MEALS AND 1 TABLET AT BEDTIME. FOUR TIMES/DAY  4  . Multiple Vitamin (MULTIVITAMIN WITH MINERALS) TABS tablet Take 1  tablet by mouth daily.    Marland Kitchen OVER THE COUNTER MEDICATION Place 1 drop into both eyes daily. Over the counter preservative free lubricating eye drops    . pantoprazole (PROTONIX) 40 MG tablet Take 40 mg by mouth 2 (  two) times daily.     . Probiotic Product (PROBIOTIC PEARLS) CAPS Take 1 capsule by mouth daily with lunch.     . ranitidine (ZANTAC) 150 MG tablet Take 150 mg by mouth daily as needed for heartburn (acid reflux).     . sodium chloride (OCEAN) 0.65 % SOLN nasal spray Place 1 spray into both nostrils daily.    Marland Kitchen spironolactone (ALDACTONE) 25 MG tablet TAKE 1 TABLET BY MOUTH EVERY DAY 30 tablet 2  . torsemide (DEMADEX) 10 MG tablet Take 1 tablet (10 mg total) by mouth every other day. 15 tablet 11  . torsemide (DEMADEX) 20 MG tablet Take 0.5 tablets (10 mg total) by mouth every other day. 30 tablet 5  . valsartan (DIOVAN) 320 MG tablet Take 1 tablet (320 mg total) by mouth daily. 30 tablet 11  . vitamin E 400 UNIT capsule Take 400 Units by mouth daily.     No facility-administered medications prior to visit.     Allergies  Allergen Reactions  . Baclofen Other (See Comments)    Caused leg weakness  . Hctz [Hydrochlorothiazide] Other (See Comments)    Uric acid elevation and gout.     . Lotensin [Benazepril] Other (See Comments)    Dry cough   . Victoza [Liraglutide] Other (See Comments)    Tongue Glossitus  . Acyclovir And Related Diarrhea  . Beta Adrenergic Blockers Other (See Comments)    Occurred with metoprolol  REACTION: coughing  . Losartan Diarrhea    ROS Review of Systems    Objective:    Physical Exam  BP (!) 142/66   Pulse 70   Temp 98.6 F (37 C) (Oral)   Wt 200 lb 6.4 oz (90.9 kg)   LMP  (LMP Unknown)   SpO2 96%   BMI 39.14 kg/m  Wt Readings from Last 3 Encounters:  01/24/19 200 lb 6.4 oz (90.9 kg)  01/11/19 196 lb (88.9 kg)  12/29/18 190 lb (86.2 kg)   Abd benign Weight is up,  2 + peripheral edema.  Health Maintenance Due  Topic Date Due   . OPHTHALMOLOGY EXAM  07/12/2018    There are no preventive care reminders to display for this patient.  Lab Results  Component Value Date   TSH 10.250 (H) 12/15/2018   Lab Results  Component Value Date   WBC 8.3 11/24/2018   HGB 12.4 11/24/2018   HCT 35.7 11/24/2018   MCV 91 11/24/2018   PLT 252 11/24/2018   Lab Results  Component Value Date   NA 142 11/24/2018   K 4.3 11/24/2018   CO2 22 11/24/2018   GLUCOSE 125 (H) 11/24/2018   BUN 19 11/24/2018   CREATININE 0.96 11/24/2018   BILITOT <0.2 10/16/2018   ALKPHOS 46 10/16/2018   AST 14 10/16/2018   ALT 18 10/16/2018   PROT 7.1 10/16/2018   ALBUMIN 4.3 10/16/2018   CALCIUM 9.9 11/24/2018   ANIONGAP 11 04/08/2018   Lab Results  Component Value Date   CHOL 154 10/16/2018   Lab Results  Component Value Date   HDL 35 (L) 10/16/2018   Lab Results  Component Value Date   LDLCALC 54 10/16/2018   Lab Results  Component Value Date   TRIG 323 (H) 10/16/2018   Lab Results  Component Value Date   CHOLHDL 4.4 10/16/2018   Lab Results  Component Value Date   HGBA1C 6.5 12/29/2018      Assessment & Plan:   Problem List Items Addressed This  Visit    Generalized weakness   Relevant Orders   CBC   Basic Metabolic Panel      No orders of the defined types were placed in this encounter.   Follow-up: No follow-ups on file.    Zenia Resides, MD

## 2019-01-24 NOTE — Patient Instructions (Signed)
Since I will be out of town tomorrow, I will ask Dr. Shan Levans to look at these labs and let you know. Please take your blood pressure several times at home - especially when you are weak.  I am worried that your blood pressure may be falling too low.   Because I think it is better to be on less medicine, try not taking the immodium. I do want you back on your torsemide.  Your weight and swollen ankles show your are opposite of dehydrated.   Because I don't know for sure why you feel weak, please plan to see Dr. Shan Levans in the next week or two for a recheck.  Bring in a list of your blood pressures in with you for Dr. Shan Levans to review.

## 2019-01-25 LAB — BASIC METABOLIC PANEL
BUN/Creatinine Ratio: 26 (ref 12–28)
BUN: 21 mg/dL (ref 8–27)
CO2: 21 mmol/L (ref 20–29)
Calcium: 9.8 mg/dL (ref 8.7–10.3)
Chloride: 103 mmol/L (ref 96–106)
Creatinine, Ser: 0.8 mg/dL (ref 0.57–1.00)
GFR calc Af Amer: 81 mL/min/{1.73_m2} (ref >59)
GFR calc non Af Amer: 70 mL/min/{1.73_m2} (ref >59)
GLUCOSE: 102 mg/dL — AB (ref 65–99)
Potassium: 4.4 mmol/L (ref 3.5–5.2)
Sodium: 141 mmol/L (ref 134–144)

## 2019-01-25 LAB — CBC
Hematocrit: 36 % (ref 34.0–46.6)
Hemoglobin: 12.1 g/dL (ref 11.1–15.9)
MCH: 31.3 pg (ref 26.6–33.0)
MCHC: 33.6 g/dL (ref 31.5–35.7)
MCV: 93 fL (ref 79–97)
Platelets: 249 10*3/uL (ref 150–450)
RBC: 3.86 x10E6/uL (ref 3.77–5.28)
RDW: 13.4 % (ref 11.7–15.4)
WBC: 8.4 10*3/uL (ref 3.4–10.8)

## 2019-01-30 ENCOUNTER — Telehealth: Payer: Self-pay | Admitting: *Deleted

## 2019-01-30 NOTE — Telephone Encounter (Signed)
Pt is calling for lab results. Fleeger, Salome Spotted, CMA

## 2019-01-30 NOTE — Telephone Encounter (Signed)
I returned phone call.  She feels better and labs are reassuring.  FU with Dr. Shan Levans.

## 2019-02-06 ENCOUNTER — Telehealth: Payer: Self-pay | Admitting: Family Medicine

## 2019-02-06 ENCOUNTER — Telehealth: Payer: Self-pay | Admitting: *Deleted

## 2019-02-06 NOTE — Telephone Encounter (Signed)
Returned Ms. Dufford's call regarding her blood pressure.  Her blood pressure is been in the 150s and she has been very concerned about this.  She also notes that she has had high blood sugars and does not even want to tell me what they are because they are so high.  She has been stressed out about this as well.  She is continue to take amlodipine 2.5 mg in addition to the 10 mg scheduled in hopes that it will bring her blood pressure down.  She denies chest pain, headache, or other symptoms.  I reassured her that a systolic blood pressure in the 150s especially in the short-term is not dangerous.  I reassured her that we will look into her blood pressure and make any medication changes necessary at her next appointment, which is with Dr. Jeannine Kitten.  I also told her that I would let Dr. Jeannine Kitten know of her concerns before she sees him so that he has some background.  Patient was in agreement with this plan.

## 2019-02-06 NOTE — Telephone Encounter (Signed)
Pt called and she wanted to talk to Dr. Shan Levans.  Please have Dr. Shan Levans call the pt at [phone]. ad

## 2019-02-06 NOTE — Telephone Encounter (Signed)
Pt calls because her BPs have been in the "150's".  Asked if she know what the bottom numbers are but she does not recall.She denies CP, SOB or arm pain.  Advised that since she is asymptomatic that I would like to get her scheduled for an appt but nothing urgent.   She is unable to come this week and would prefer to see Dr. Shan Levans. Explained that Dr. Shan Levans does not have any availability til March 17th and that I would like her to be seen before then.   Pt is agreeable to seeing other provider as long as I keep Dr. Shan Levans "in the loop".  Appt made with Dr. Jeannine Kitten for Tuesday (02/13/19).  Pt to call back if she experiences any symptoms.  Kamrie Fanton, Salome Spotted, CMA

## 2019-02-09 ENCOUNTER — Other Ambulatory Visit: Payer: Self-pay | Admitting: *Deleted

## 2019-02-09 MED ORDER — TORSEMIDE 10 MG PO TABS: 10.0000 mg | ORAL_TABLET | Freq: Every day | ORAL | 0 refills | Status: DC | PRN

## 2019-02-09 NOTE — Telephone Encounter (Signed)
Per Pharmacy, directions have changed to 1 tablet everyday instead of 1 tablet every other day.  Will forward to MD. Fleeger, Salome Spotted, Adin

## 2019-02-12 ENCOUNTER — Telehealth: Payer: Self-pay

## 2019-02-12 NOTE — Telephone Encounter (Signed)
Pt called nurse line stating her and [name] discussed giving her an insulin sample, here in clinic. Pt has an apt on 2/26 with Jeannine Kitten. Pt can obtain sample then, if we have any.

## 2019-02-13 ENCOUNTER — Telehealth: Payer: Self-pay | Admitting: *Deleted

## 2019-02-13 ENCOUNTER — Other Ambulatory Visit: Payer: Self-pay

## 2019-02-13 NOTE — Telephone Encounter (Signed)
Pt calls because she stood up to fast and now she is dizzy.  This happened around 7:30am, but she is now starting to feel better.  She is "Very nervous" because her BP is 140/88 and she feels that the 88 is too high.  Pt has appt tomorrow. Pt is commonly anxious about BP.  She denies SOB/CP/Jaw or arm pain. Advised to take it easy and to get up slowly.  She will call Son or use her life alert if she feels unsafe @ home.   Pt appreciative of advice and will call back if needed. Rebecca Walter, Salome Spotted, CMA

## 2019-02-13 NOTE — Patient Outreach (Signed)
[facility]) Care Management  02/13/2019  Arriyana C Boylan Jun 21, 1941 [id number]   Medication Adherence call to Mrs. Honour [facility]r [facility] [employer] [facility] spoke with patient she ask to give her a call back tomorrow she is not feeling well patient is showing past due on Valsartan  320 mg under due under [place].   [facility] Management Direct Dial [phone]  Fax [phone] [email or web address]

## 2019-02-14 ENCOUNTER — Encounter: Payer: Self-pay | Admitting: Family Medicine

## 2019-02-14 ENCOUNTER — Telehealth: Payer: Self-pay | Admitting: Family Medicine

## 2019-02-14 ENCOUNTER — Other Ambulatory Visit: Payer: Self-pay

## 2019-02-14 ENCOUNTER — Ambulatory Visit: Payer: Medicare Other | Admitting: Family Medicine

## 2019-02-14 VITALS — BP 133/55 | HR 70 | Temp 98.1°F | Wt 194.0 lb

## 2019-02-14 DIAGNOSIS — E1143 Type 2 diabetes mellitus with diabetic autonomic (poly)neuropathy: Secondary | ICD-10-CM | POA: Diagnosis not present

## 2019-02-14 DIAGNOSIS — R42 Dizziness and giddiness: Secondary | ICD-10-CM | POA: Diagnosis not present

## 2019-02-14 DIAGNOSIS — I10 Essential (primary) hypertension: Secondary | ICD-10-CM

## 2019-02-14 DIAGNOSIS — Z794 Long term (current) use of insulin: Secondary | ICD-10-CM

## 2019-02-14 NOTE — Progress Notes (Signed)
Rebecca Walter Phone: [phone]   cc: Dizziness  Subjective:  Dizziness: Patient had a one episode of dizziness yesterday morning during breakfast that lasted about 1 hour.  She states she felt bad all day after that and lay down.  She described it as the room was spinning.  Additionally, she has 1 year approximately of experiencing pressure in her head and neck stiffness.  She has not had any falls during this time.  The last time she fell was in January.  She denies hearing changes no vision changes, no changes in urination, no abdominal pain, no chest pain.  The patient has high blood pressure for which she takes 10 mg of amlodipine in the morning as well as valsartan at night.  She states last night she did not take the valsartan because she does not think it works for her.  Instead she took 5 mg of amlodipine.  She has extra 5 mg and 2.5 mg amlodipine pills from previous prescriptions.  Sometimes she takes these in addition to her 10 mg pills and she thinks her blood pressure is high.  She takes her blood pressure 3 times a day.  The highest it got yesterday was 164 the lowest was 128.  Diabetes: Patient states she needs more NovoLog.  She has some but she is almost out of it.     ROS: See HPI for pertinent positives and negatives  Past Medical History  Family history reviewed for today's visit. No changes.  Social history- patient is a non-smoker  Objective: BP (!) 133/55   Pulse 70   Temp 98.1 F (36.7 C) (Oral)   Wt 194 lb (88 kg)   LMP  (LMP Unknown)   SpO2 96%   BMI 37.89 kg/m  Gen: NAD, alert and oriented, cooperative with exam HEENT: No nystagmus CV: normal rate, regular rhythm. No murmurs, no rubs.  Resp: LCTAB, no wheezes, crackles. normal work of breathing Msk: No edema, warm, normal tone, moves UE/LE spontaneously.  Patient able to stand without difficulty and walk across the room and back. Neuro: CN II-XII grossly intact. no gross  deficits Psych: Patient appears very concerned about her blood pressure and relates all of her symptoms to her blood pressure.  She perseverates on her blood pressure.  Assessment/Plan: Vertigo Patient describes 1 hour vertigo yesterday morning.  This resolved on its own.  She has now had no other episodes.  Unlikely that it is related to her hypertension given that her max systolic blood pressure was 160 and she is normally around 140-150.  Possible that patient has benign paroxysmal positional vertigo.  We will continue to monitor.  Hypertension Patient is not taking her medicine as prescribed.  She does not take valsartan every day.  She sometimes takes more than the maximal daily dose of amlodipine.  It was explained to her that these medications are not short acting medications and that immediate changes in her blood pressure throughout the day were not attributable to these medications.  Told her that dizziness is a potential side effect of amlodipine and that could be what had caused her symptoms.  Advised her to take these medications as they were prescribed to her.  Advised patient to not be so concerned about individual blood pressure readings as these can fluctuate throughout the day.  Advised patient to take an average of all her systolic and diastolic readings throughout the day.  Did not make any changes to her medications.  Scheduled  follow-up with her PCP at first available time which is approximately 1 month.  Diabetes mellitus, type II ([place]) Patient takes Lantus and NovoLog.  Patient asked for NovoLog samples although we did not have any samples today to give to her.  Patient states that she has enough medications to last her until she gets her prescription refilled.    Clemetine Marker, MD PGY-1

## 2019-02-14 NOTE — Telephone Encounter (Signed)
Pt has scheduled an appointment with Dr. Shan Levans but would like for her to call her as soon as possible to discuss her blood pressure.

## 2019-02-14 NOTE — Telephone Encounter (Signed)
**  After Hours/ Emergency Line Call**  Received a call to report that Rebecca Walter that she was frustrated about BP medications.  Saw PCP office today for elevated blood pressures. Patient distressed as she was seen by another doctor (not PCP). Explained to doctor that her blood pressures have been elevated and had headaches but doctor did not change any medications. Home regimen: Amlodipine 10mg  and another med at night. On chart review patient on Norvasc 10, coreg 3.125, spironolactone 25mg , valsartan 320mg . Patient confirmed this is her regimen. Patient states despite 4 medications she is now having blood pressure problems and now having head problems. BP at home is 156/60s. Was having dizziness yesterday, this has resolved. Dr. Irish Elders talked to a preceptor who told him not to change medications at this time. Patient would like to speak to Dr. Shan Walter (PCP) to discuss blood pressure. I informed patient that Dr. Shan Walter is not available but I would be happy to send Dr. Shan Walter an epic message stating patient's frustrations. Informed patient that if she measures her blood pressures at home and they continue to elevate or she becomes symptomatic (headaches that do not improve, vision changes, dizziness, chest pain, SOB, edema, etc) she should come to ED to be evaluated by a physician as I am unable to examine her over the phone. Red flags discussed.  Will forward to PCP. Would consider calling patient in the AM to discuss blood pressure regimen. Patient was seen today and had preceptor review assessment/plan with all the same symptoms so does not need to be seen in clinic tomorrow AM. BP in clinic visit today 133/55, within goal.   Rebecca More, DO PGY-2, [facility] 02/14/2019 7:13 PM

## 2019-02-14 NOTE — Patient Instructions (Signed)
It was great to meet you today.   It is VERY important that you take your medications as they are prescribed. The maximum daily dose for amlodipine is 10mg , which you get in your morning pill.  Amlodipine is also not a fast acting drug, so the change in blood pressure right after taking it is probably not due to the amlodipine.  Dizziness is also a possible side effect of amlodipine, which may be why you felt dizzy yesterday.    I would like you to continue to take your amlodipine 10mg  in the morning and continue to take the valsartan at night, even though you don't think it is working.  It still has other benefits for your kidneys and heart that go beyond its effects on blood pressure.    If you would still like to take your blood pressure three times a day I would suggest that you start taking the average of those three numbers. .you can add the systolic numbers up and divide by the number of times you took your BP that day and then do the same for the diastolic number.  It is normal for blood pressure to fluctuate 20 points or more during the day.    We don't have any novolog samples today for you.    Have a great day,   Clemetine Marker, MD

## 2019-02-14 NOTE — Telephone Encounter (Signed)
Thanks for your help with this, Janett Billow!

## 2019-02-15 ENCOUNTER — Telehealth: Payer: Self-pay | Admitting: Family Medicine

## 2019-02-15 ENCOUNTER — Ambulatory Visit: Payer: Medicare Other | Admitting: Pharmacist

## 2019-02-15 NOTE — Assessment & Plan Note (Signed)
Patient is not taking her medicine as prescribed.  She does not take valsartan every day.  She sometimes takes more than the maximal daily dose of amlodipine.  It was explained to her that these medications are not short acting medications and that immediate changes in her blood pressure throughout the day were not attributable to these medications.  Told her that dizziness is a potential side effect of amlodipine and that could be what had caused her symptoms.  Advised her to take these medications as they were prescribed to her.  Advised patient to not be so concerned about individual blood pressure readings as these can fluctuate throughout the day.  Advised patient to take an average of all her systolic and diastolic readings throughout the day.  Did not make any changes to her medications.  Scheduled follow-up with her PCP at first available time which is approximately 1 month.

## 2019-02-15 NOTE — Telephone Encounter (Signed)
Called Rebecca Walter back at around 11:30 this morning.  We talked at length about her various concerns, including high blood pressure, low blood sugar, neck pain likely due to a fall that occurred about a month ago, and leg weakness.  I reassured her that her blood pressure is not dangerous to her and is mostly within normal limits.  I encouraged her to eat small meals frequently to avoid symptoms of hypoglycemia.  I told her that, since unfortunately the next available appointment for her to see me is March 31, I can look for an open slot with Rebecca Walter, who also knows her very well.  I offered her an appointment at 130 today, but she said that that would not work for her very well.  I also offered her various other times, and we ended up choosing an appointment in the afternoon of March 19, since she can only come to appointments in the afternoons.  I have asked my CMA on white team to call patient back regarding her symptoms that she recently reported in her latest phone call to the clinic this afternoon.  Unfortunately, I cannot diagnose or treat the symptoms over the phone, so she will either need to make an appointment with a different provider or go to urgent care or the emergency department if she is concerned.

## 2019-02-15 NOTE — Telephone Encounter (Signed)
Called patient's home number and left VM relaying to her that I had been in touch with my colleague who had spoken to her on the after hours line overnight and that I understood that she was frustrated and concerned about her blood pressure.  I reassured her that her blood pressure readings have not been at a level that are dangerous to her.  I did suggest that she take her once daily blood pressure medications at night rather than in the morning to see if that helped reduce her blood pressure further.  I told her that I will try calling her back later today to discuss this further.

## 2019-02-15 NOTE — Assessment & Plan Note (Signed)
Patient takes Lantus and NovoLog.  Patient asked for NovoLog samples although we did not have any samples today to give to her.  Patient states that she has enough medications to last her until she gets her prescription refilled.

## 2019-02-15 NOTE — Telephone Encounter (Signed)
**  After Hours/ Emergency Line Call**  Received a call to report that Rebecca Walter calling for concerns of blood pressure. States it is not an emergency but she would like to speak to PCP. Explained that this is the after hours emergency line and PCP is not available. Patient states she was able to speak to PCP this morning but had some more questions for her. States her BP has come down and is not longer elevated. States that "her head is not feeling right" which is why she wants to speak to Dr. Shan Levans. I attempted to get more information of symptoms. Patient states headache has been occurring for days now and resolves with tylenol. No vision changes. No chest pain or SOB. Patient was just calling because she would like me to send a message to her PCP to call her in the morning. Does not think headache is severe enough to go to ED. I informed her that it was difficult to assess HA over the phone and if she continues to have symptoms she should be seen in ED, patient agreed with this plan. Strict return precautions given. Red flags discussed.    From chart review it appears PCP called her this afternoon to discuss these symptoms. Appointment for 3/19 (next available) was made for patient. Will forward to PCP.  Caroline More, DO PGY-2, [facility] 02/15/2019 11:41 PM

## 2019-02-15 NOTE — Telephone Encounter (Signed)
Pt lm on nurse line.  She missed call from Dr. Shan Levans and would like a call back. Fleeger, Salome Spotted, CMA

## 2019-02-15 NOTE — Assessment & Plan Note (Signed)
Patient describes 1 hour vertigo yesterday morning.  This resolved on its own.  She has now had no other episodes.  Unlikely that it is related to her hypertension given that her max systolic blood pressure was 160 and she is normally around 140-150.  Possible that patient has benign paroxysmal positional vertigo.  We will continue to monitor.

## 2019-02-15 NOTE — Telephone Encounter (Signed)
Pt is calling and said that she was able to speak with Dr. Shan Levans but is now having the same symptoms that they discussed and is not feeling well. She would like for Dr. Shan Levans to call her again.

## 2019-02-16 ENCOUNTER — Telehealth: Payer: Self-pay | Admitting: Family Medicine

## 2019-02-16 NOTE — Telephone Encounter (Signed)
I discussed management of Ms. Pascal's anxiety regarding her blood pressure with my attending, Dr. Owens Shark, and we decided that we would offer her home health physical therapy to help her regain some of her confidence with walking after her fall, since she has reported some leg weakness that has worried her.  We will also offer home health nursing to check on her and to go over her medications if she would find that helpful.  I would also recommend that she be seen by an attending physician sometime next week, since I am on vacation then.  She currently has an appointment with Dr. Valentina Lucks on 3/19 and with me on 3/31.  Patient can only come to afternoon appointments, which limits her availability.  Since I have spoken with her several times over the past week and reassured her that her symptoms do not seem dangerous and she continues to call our clinic and after-hours line multiple times per day despite these reassurances, I feel that my calling her back again today will not be productive.  Patient would benefit from frequent visits with myself, Dr. Valentina Lucks since he knows her well, and possibly another attending.  My CMA, Nehemiah Settle, will call her and offer these options to her.

## 2019-02-16 NOTE — Telephone Encounter (Signed)
Pt calling wanting [name] to give her a call back. Pt has called many times over the past few days stating someone is supposed to be calling her to discuss her blood pressure. I offered her an appt like [name]'s phone note said from 2/27 and pt declined. Told pt I would send a phone note back requesting that [name] give her a call back. She is wanting to talk to her today. Please advise

## 2019-02-16 NOTE — Telephone Encounter (Signed)
Spoke to pt. She is feeling better. I asked if she would like to have home health nursing or PT and she said at this time she thinks she is ok.  I offered her an appt with an attending Dr. Next week as Dr. Shan Levans will be out of the office. Pt declined but did say she will call Monday if need be. I confirmed her appt with Dr. Valentina Lucks on the 19. Ottis Stain, CMA

## 2019-02-16 NOTE — Telephone Encounter (Signed)
Spoke with pt. She is feeling a little better and thinks its due to her not eating enough. Pt also asked if when her BP is high if she can take a 2.5 amlodipine (she has some left over) after she takes the 10 MG amlodipine in the AM. Please advise. Ottis Stain, CMA

## 2019-02-21 ENCOUNTER — Ambulatory Visit: Payer: Medicare Other | Admitting: Podiatry

## 2019-02-22 ENCOUNTER — Telehealth: Payer: Self-pay | Admitting: Family Medicine

## 2019-02-22 NOTE — Telephone Encounter (Signed)
Pt would like Koval to give her a call back concerning her blood pressure. Please advise

## 2019-02-24 ENCOUNTER — Telehealth: Payer: Self-pay | Admitting: Family Medicine

## 2019-02-24 NOTE — Telephone Encounter (Signed)
**  After Hours/ Emergency Line Call**  Received a call to report that Rebecca Walter was having some congestion after taking her regular medications tonight as well as some intermittent diarrhea.  Endorsing after taking her regular pills she developed nose congestion.  Also has been taking Imodium in order to help with her diarrhea that she has been having.  Denying trouble breathing, rash or swelling, dizziness, and chest pain.  Recommended that she keep her regular follow-up appointment with the gastroenterologist.  Red flags discussed.  Will forward to PCP.  [place] Lissete Maestas, DO PGY-2, [facility] 02/24/2019 11:05 PM

## 2019-02-27 NOTE — Addendum Note (Signed)
Addended by: Leavy Cella on: 02/27/2019 11:27 AM   Modules accepted: Orders

## 2019-02-27 NOTE — Telephone Encounter (Signed)
Reported GI symptoms - diarrhea predominant. Blood sugar is under good control.  Reports weight loss. Blood Pressure 160 / 68-75 despite multi-drug regimen including torsemide 10mg  and spironolactone.  Pulse rate was reported as 70.   Plan to follow up with her calling me in 2 days.

## 2019-02-27 NOTE — Telephone Encounter (Signed)
Called in follow up of reported blood pressure elevation and cold symptoms.

## 2019-02-28 DIAGNOSIS — R197 Diarrhea, unspecified: Secondary | ICD-10-CM | POA: Diagnosis not present

## 2019-03-01 ENCOUNTER — Telehealth: Payer: Self-pay | Admitting: Pharmacist

## 2019-03-01 DIAGNOSIS — R197 Diarrhea, unspecified: Secondary | ICD-10-CM | POA: Diagnosis not present

## 2019-03-01 NOTE — Telephone Encounter (Signed)
Called with complaint of feeling flushed, hot and pain in neck.    When call was returned in later PM - all symptoms had resolved.  She is concerned her blood pressure was 161/60 with this episode.  Advised that we should NOT take any of her old medication (amlodipine).

## 2019-03-01 NOTE — Telephone Encounter (Signed)
Patient called to share news that she had GI visit yesterday and she supplied stool samples for evaluation.  Her blood pressure was elevated at that visit BUT she had a much improved blood pressure of 148/59 this AM.   She states she was feeling somewhat better this AM.    Following lengthy discussion we agreed to follow-up PRN if her GI symptoms worsen.   Total time in Phone conversation: 12 minutes.

## 2019-03-02 ENCOUNTER — Telehealth: Payer: Self-pay | Admitting: Pharmacist

## 2019-03-02 MED ORDER — AMLODIPINE BESYLATE 2.5 MG PO TABS: 2.5000 mg | ORAL_TABLET | Freq: Every day | ORAL | Status: DC

## 2019-03-02 NOTE — Telephone Encounter (Signed)
Phone call from patient - states diarrhea is resolving - less yesterday.   Please REstart amlodipine 2.5mg  once daily until next week.   For elevated BP to 927-639 systolic.   I stated clearly to NOT use any dose higher due to risk of orthostatic hypotension.   I hope that the diarrhea resolution will help you with your symptoms.

## 2019-03-02 NOTE — Telephone Encounter (Signed)
That would be great.  I will be at the COVID-19 talk today and will be in clinic all day next Tuesday if any of those times would work for you.  Thanks again for your help with her.

## 2019-03-04 ENCOUNTER — Other Ambulatory Visit: Payer: Self-pay | Admitting: Family Medicine

## 2019-03-04 ENCOUNTER — Other Ambulatory Visit: Payer: Self-pay | Admitting: Internal Medicine

## 2019-03-04 DIAGNOSIS — F419 Anxiety disorder, unspecified: Secondary | ICD-10-CM

## 2019-03-05 ENCOUNTER — Other Ambulatory Visit: Payer: Self-pay

## 2019-03-05 MED ORDER — PANTOPRAZOLE SODIUM 40 MG PO TBEC: 40.0000 mg | DELAYED_RELEASE_TABLET | Freq: Two times a day (BID) | ORAL | 3 refills | Status: DC

## 2019-03-08 ENCOUNTER — Ambulatory Visit: Payer: Medicare Other | Admitting: Pharmacist

## 2019-03-08 ENCOUNTER — Other Ambulatory Visit: Payer: Self-pay

## 2019-03-08 ENCOUNTER — Telehealth: Payer: Self-pay | Admitting: Pharmacist

## 2019-03-08 DIAGNOSIS — I10 Essential (primary) hypertension: Secondary | ICD-10-CM | POA: Diagnosis not present

## 2019-03-08 DIAGNOSIS — I110 Hypertensive heart disease with heart failure: Secondary | ICD-10-CM

## 2019-03-08 DIAGNOSIS — I5032 Chronic diastolic (congestive) heart failure: Secondary | ICD-10-CM

## 2019-03-08 MED ORDER — INDAPAMIDE 1.25 MG PO TABS: 1.2500 mg | ORAL_TABLET | Freq: Every day | ORAL | 5 refills | Status: DC

## 2019-03-08 NOTE — Patient Instructions (Addendum)
It was nice to see you today!  Please STOP taking amlodipine completely.   Please START taking indapamide 1.25 mg once daily.  Please continue your other medicines as prescribed.  Please only use torsemide AS NEEDED for leg swelling.  Try to check your blood pressure twice daily - ONCE AFTER your medicines in the morning, if possible, and ONCE in the evening  We will call you early next week to follow up with you and see how you are doing.

## 2019-03-08 NOTE — Telephone Encounter (Signed)
Contact to determine need/prefernece for in-person visit today.  Patient desired to be seen due to continued high blood pressure readings - noted that she had EMS come to her house for a systolic reading of 828 yesterday.     She took some extra blood pressure medicine and did NOT receive any medication from EMS.    She was asked to bring all medications with her to the visit today at 1:30  She accepted that plan.

## 2019-03-08 NOTE — Progress Notes (Signed)
S:    Patient arrives accompanied by her son, Merry Proud, and is ambulating with a cane. Presents to the clinic for hypertension evaluation, counseling, and management. Patient was referred by Dr. Shan Levans. Patient was last seen by Dr. Jeannine Kitten on 2/26. Patient had visit by EMS on 3/18 for SBP 175. She did not receive any medication by EMS. Patient states she has been having a sore throat and congestion and believes this is related to high blood pressure.  Current BP Medications include:  Amlodipine 10 mg daily, carvedilol 3.125 mg BID, spironolactone 25 mg once daily, torsemide 10 mg daily PRN, valsartan 320 mg daily   -Patient reports taking torsemide 10 mg every day -Patient reports taking amlodipine 10 mg daily plus an extra 2.5-10 mg PRN when her BP is high on her home meter -Patient reports adherence with all other medications  Antihypertensives tried in the past include: hydrochlorothiazide (uric acid elevation and gout), benazepril (cough), metoprolol (cough), losartan (diarrhea)  Home BP readings: SBP - 160s on home BP monitor  BP manual check today: 136/62. Patient was concerned because she felt like her BP was higher. BP rechecked with patient's home meter and it was 166/99. BP manual checked again and it was 148/62. Final check with clinic's automated BP monitor was 146/66.   O:  Physical Exam Vitals signs reviewed.  HENT:     Nose: Congestion present.  Musculoskeletal:        General: No swelling.  Neurological:     Mental Status: She is alert.    Review of Systems  Cardiovascular: Negative for leg swelling.  Gastrointestinal: Negative for diarrhea.  Neurological: Positive for dizziness.  Psychiatric/Behavioral: The patient is nervous/anxious.     Last 3 Office BP readings: BP Readings from Last 3 Encounters:  03/08/19 136/62  02/14/19 (!) 133/55  01/24/19 (!) 142/66    BMET    Component Value Date/Time   NA 141 01/24/2019 1640   K 4.4 01/24/2019 1640   CL 103  01/24/2019 1640   CO2 21 01/24/2019 1640   GLUCOSE 102 (H) 01/24/2019 1640   GLUCOSE 201 (H) 04/08/2018 1628   BUN 21 01/24/2019 1640   CREATININE 0.80 01/24/2019 1640   CREATININE 0.93 09/16/2016 1410   CALCIUM 9.8 01/24/2019 1640   GFRNONAA 70 01/24/2019 1640   GFRNONAA 70 02/06/2015 1418   GFRAA 81 01/24/2019 1640   GFRAA 81 02/06/2015 1418   Uric Acid = 5.4 (04/2017)  Renal function: CrCl cannot be calculated (Patient's most recent lab result is older than the maximum 21 days allowed.).  Clinical ASCVD: No  The 10-year ASCVD risk score Mikey Bussing DC Jr., et al., 2013) is: 53.4%   Values used to calculate the score:     Age: 78 years     Sex: Female     Is Non-Hispanic African American: No     Diabetic: Yes     Tobacco smoker: No     Systolic Blood Pressure: 194 mmHg     Is BP treated: Yes     HDL Cholesterol: 35 mg/dL     Total Cholesterol: 154 mg/dL   A/P: Hypertension uncontrolled due to not being optimally managed on current medications and underlying anxiety. BP in clinic today is at goal <140/90 mmHg. Patient is not taking amlodipine as prescribed. -Stop amlodipine due to limited benefit at maximum dose -Start indapamide 1.25 mg once daily -Continue taking carvedilol, spironolactone, and valsartan  -Instructed to only take torsemide 10 mg daily PRN for  edema and discouraged taking this every day without symptoms of volume overload -Counseled patient that home meter may be overestimating BP based on manual, home BP, and automated device readings in clinic today. -Counseled on lifestyle modifications for blood pressure control including increased physical activity  Seasonal allergies currently uncontrolled due to congestion and sore throat symptoms. -Instructed patient these symptoms are likely related to allergies and less likely related to high BP -Encouraged to continue using saline nasal spray and loratidine (Claritin) for symptom relief -Counseled to avoid using  decongestants, such as pseudoephedrine (Sudafed) as these products can also increase BP   Results reviewed and written information provided.   Total time in face-to-face counseling 52 minutes.   F/U via phone in 1 week to check progress.  Patient seen with Vertis Kelch, PharmD, PGY-1 resident. and Catie Darnelle Maffucci, PharmD,  PGY2 Pharmacy Resident.

## 2019-03-11 ENCOUNTER — Encounter: Payer: Self-pay | Admitting: Pharmacist

## 2019-03-11 DIAGNOSIS — E161 Other hypoglycemia: Secondary | ICD-10-CM | POA: Diagnosis not present

## 2019-03-11 DIAGNOSIS — I959 Hypotension, unspecified: Secondary | ICD-10-CM | POA: Diagnosis not present

## 2019-03-11 DIAGNOSIS — R0602 Shortness of breath: Secondary | ICD-10-CM | POA: Diagnosis not present

## 2019-03-11 DIAGNOSIS — E162 Hypoglycemia, unspecified: Secondary | ICD-10-CM | POA: Diagnosis not present

## 2019-03-11 DIAGNOSIS — R42 Dizziness and giddiness: Secondary | ICD-10-CM | POA: Diagnosis not present

## 2019-03-11 NOTE — Assessment & Plan Note (Signed)
Hypertension uncontrolled due to not being optimally managed on current medications and underlying anxiety. BP in clinic today is at goal <140/90 mmHg. Patient is not taking amlodipine as prescribed. -Stop amlodipine due to limited benefit at maximum dose -Start indapamide 1.25 mg once daily -Continue taking carvedilol, spironolactone, and valsartan  -Instructed to only take torsemide 10 mg daily PRN for edema and discouraged taking this every day without symptoms of volume overload -Counseled patient that home meter may be overestimating BP based on manual, home BP, and automated device readings in clinic today. -Counseled on lifestyle modifications for blood pressure control including increased physical activity

## 2019-03-12 ENCOUNTER — Ambulatory Visit: Payer: Medicare Other | Admitting: Podiatry

## 2019-03-12 ENCOUNTER — Telehealth: Payer: Self-pay | Admitting: Internal Medicine

## 2019-03-12 NOTE — Telephone Encounter (Signed)
Spoke to patient  She is being followed in [facility] clinic for BP   Changes being made No chest tightness.  Will cancel appt on Friday    I am available in future if needed   Family medicine to refer.    I do not want to add confusion/problems with BP control nor should she risk exposure to infection

## 2019-03-12 NOTE — Progress Notes (Signed)
Patient ID: Rebecca Walter, female   DOB: 1941/06/06, 78 y.o.   MRN: [id number] Reviewed: Agree with Dr. Graylin Shiver documentation and management.

## 2019-03-13 NOTE — Telephone Encounter (Signed)
Patient contacted in follow-up of visit from last week.   Reports blood pressure 150-160 / 60-70. Denies readings consistently below 60 at this time.   She denies symptomatic hypotension/dizziness.  Stomach/nausea/diarrhea - denies vomiting.   Reports her stomach continues to be problematic and is preventing her from sleeping well.   She reports that walking is more challenging as she feels weaker.    Patient reported getting to doctor visits has been challenging.

## 2019-03-13 NOTE — Telephone Encounter (Signed)
Appointment cancelled for 3/27, sent to COVID 19 r/s pool.

## 2019-03-14 ENCOUNTER — Other Ambulatory Visit: Payer: Self-pay | Admitting: Family Medicine

## 2019-03-14 ENCOUNTER — Telehealth: Payer: Self-pay | Admitting: Family Medicine

## 2019-03-14 MED ORDER — ONDANSETRON HCL 4 MG PO TABS: 4.0000 mg | ORAL_TABLET | Freq: Three times a day (TID) | ORAL | 0 refills | Status: DC | PRN

## 2019-03-14 NOTE — Telephone Encounter (Signed)
Contacted pt and gave her the below information and she stated that she would still like for PCP to call her tomorrow.  Routing to PCP. Betul Brisky Zimmerman Rumple, CMA

## 2019-03-14 NOTE — Telephone Encounter (Signed)
Please let Rebecca Walter know that I have sent in Zofran to her [facility] for her nausea.  I out of the office today but I can call her tomorrow if she would like for me to do so.  Tell her I hope that she feels better.

## 2019-03-14 NOTE — Telephone Encounter (Signed)
Pt called and would like some nausea medication called in.Please call her to discuss jw

## 2019-03-15 ENCOUNTER — Other Ambulatory Visit: Payer: Self-pay | Admitting: *Deleted

## 2019-03-15 NOTE — Telephone Encounter (Signed)
Spoke with Rebecca Walter regarding her anxiety and her blood pressure.  Says that the BuSpar is no longer helping her anxiety, and she wonders if it would be okay to take some Ativan for this, which she has at her house already.  Said that it was okay to take 1 Ativan as needed as long as she understands that this does increase her risk for falls.  Since it seems like her anxiety is causing her significant distress, this seems like an appropriate short-term course of action.  We discussed that she may need to consider a better medication such as an SSRI to help calm her anxiety as well.  She says that she is stopped the amlodipine and has started to take indapamide, but her blood pressure has now been even higher.  She says that her systolics have been in the 190s.  We agreed to restart amlodipine 10 mg once per day.  Her blood pressure medic measurements also increase her anxiety, so hopefully lowering her blood pressure closer to normal will also help her anxiety.  She continues to have nausea and now has constipation after having some diarrhea earlier.  I would not be surprised if these are linked to her anxiety.  I related that to her.  I asked if she would like for me to call her instead of her coming in to the office on Tuesday for her appointment, and she was agreeable to this.  I will plan to call her at around her appointment time on 3/31.

## 2019-03-16 ENCOUNTER — Other Ambulatory Visit: Payer: Self-pay

## 2019-03-16 ENCOUNTER — Telehealth (INDEPENDENT_AMBULATORY_CARE_PROVIDER_SITE_OTHER): Payer: Medicare Other | Admitting: Family Medicine

## 2019-03-16 ENCOUNTER — Ambulatory Visit: Payer: Medicare Other | Admitting: Internal Medicine

## 2019-03-16 ENCOUNTER — Other Ambulatory Visit: Payer: Self-pay | Admitting: Family Medicine

## 2019-03-16 DIAGNOSIS — R42 Dizziness and giddiness: Secondary | ICD-10-CM

## 2019-03-16 MED ORDER — GLUCOSE BLOOD VI STRP: ORAL_STRIP | 3 refills | Status: DC

## 2019-03-16 MED ORDER — MECLIZINE HCL 12.5 MG PO TABS: 12.5000 mg | ORAL_TABLET | Freq: Three times a day (TID) | ORAL | 0 refills | Status: DC | PRN

## 2019-03-16 NOTE — Progress Notes (Signed)
[facility] Visit  Patient consented to have visit conducted via telephone.  Encounter participants: Patient: Rebecca Walter  Provider: Bernita Raisin Meccariello  Others (if applicable): Dr. Mingo Amber  Chief Complaint: motion sickness  HPI: Rebecca Walter in the clinic today complaining of "motion sickness."  She states that this started yesterday and that her "head feels wobbly."  She states that she walks with a walker at baseline and has been grabbing onto the wall more, but states "I feel like my feet are steady, just my head is wobbly."  She also notes that her head feels very "full."  She states that she thinks that she is somewhat congested.  She denies any fevers, cough, shortness of breath.  She denies any tinnitus, changes in vision, numbness or tingling, focal weakness, falls.  She denies changes in hearing other than "sometimes my ears feel full."  She states that this started yesterday upon waking, she did take a baclofen yesterday at about 2:30 in the afternoon which she stated improved the symptoms.  She states that they came back last night.  The symptoms seem to get better when she lays down, but get worse when she gets up.  She also states is worse with movement of the head.  She has a history of vertigo and was seen in the clinic at the end of February for this.  She was also seen in the emergency room in September 2019 for vertigo and was given a prescription of meclizine, but patient states that she did not take it.  ROS: As noted in HPI  Pertinent PMHx: Hypertension, hyperlipidemia, diabetes, aortic stenosis, vertigo  Assessment/Plan:  Vertigo Given patient's description of symptoms, suspect this is likely vertigo.  She also has some congestion, therefore this could be viral labyrinthitis.  Reassured that she has not had any falls.  Given the symptoms started yesterday morning, would be out of window for intervention if CVA was because of  dizziness.  Will attempt to see if meclizine will improve the patient's symptoms.  Patient was given appropriate return precautions and advised that if she does not improve in the next day or 2, or if she worsens that she should come to the ED.  Also advised the patient that we have on-call physicians answering the phones over the weekend, therefore she can call if she has further concerns before coming to the ED. -Meclizine 12.5 mg 3 times daily as needed for dizziness -Advised patient that she can continue to use baclofen 5 mg up to 3 times a day as needed for her pain, but advised her to only use if dizziness was not controlled with meclizine -Return precautions given to patient, but she voiced understanding -Educated patient on Epley's maneuver and advised her where to find it    Time spent on phone with patient: 13 minutes

## 2019-03-16 NOTE — Assessment & Plan Note (Signed)
Given patient's description of symptoms, suspect this is likely vertigo.  She also has some congestion, therefore this could be viral labyrinthitis.  Reassured that she has not had any falls.  Given the symptoms started yesterday morning, would be out of window for intervention if CVA was because of dizziness.  Will attempt to see if meclizine will improve the patient's symptoms.  Patient was given appropriate return precautions and advised that if she does not improve in the next day or 2, or if she worsens that she should come to the ED.  Also advised the patient that we have on-call physicians answering the phones over the weekend, therefore she can call if she has further concerns before coming to the ED. -Meclizine 12.5 mg 3 times daily as needed for dizziness -Advised patient that she can continue to use baclofen 5 mg up to 3 times a day as needed for her pain, but advised her to only use if dizziness was not controlled with meclizine -Return precautions given to patient, but she voiced understanding -Educated patient on Epley's maneuver and advised her where to find it

## 2019-03-19 ENCOUNTER — Other Ambulatory Visit: Payer: Self-pay

## 2019-03-20 ENCOUNTER — Telehealth: Payer: Medicare Other

## 2019-03-20 ENCOUNTER — Other Ambulatory Visit (INDEPENDENT_AMBULATORY_CARE_PROVIDER_SITE_OTHER): Payer: Medicare Other | Admitting: Family Medicine

## 2019-03-20 ENCOUNTER — Other Ambulatory Visit: Payer: Self-pay

## 2019-03-20 DIAGNOSIS — F419 Anxiety disorder, unspecified: Secondary | ICD-10-CM

## 2019-03-20 DIAGNOSIS — I10 Essential (primary) hypertension: Secondary | ICD-10-CM | POA: Diagnosis not present

## 2019-03-20 MED ORDER — GLUCOSE BLOOD VI STRP: ORAL_STRIP | 3 refills | Status: DC

## 2019-03-20 NOTE — Telephone Encounter (Signed)
I spoke to Rebecca Walter.  She indicated that she has decided that she would like to start a medication for her anxiety.  Dr. Maudie Flakes note indicated SSRI or SNRI.  I offered to send in an RX.  She declined.  "Dr. Shan Levans mentioned several medications.  I would like to talk to her about the prescription."  Patient aware Dr. Shan Levans will call tomorrow at the earliest.  Forward note to Dr. Shan Levans.

## 2019-03-20 NOTE — Assessment & Plan Note (Addendum)
This continues to be a significant issue for her.  We discussed ways to mitigate anxiety, including reading and enjoyable book and calling her family members if she needs someone to talk to.  She understands that, while there are not many openings with me in the near future, she can call the clinic and I will return her calls as soon as I can.  We agreed to not make any changes right now, but she will consider the addition of an SSRI or SNRI and call the clinic if she decides to make this change.  We discussed that Ativan is only a short-term measure for her anxiety and is not indicated in patients her age.

## 2019-03-20 NOTE — Telephone Encounter (Signed)
Patient this afternoon returning doctors call relating to her anxiety medication. Please give patient a call back.

## 2019-03-20 NOTE — Assessment & Plan Note (Signed)
We will continue patient's amlodipine, indapamide, and valsartan for now.  Blood pressure appears to be at her baseline.  She does have a wide pulse pressure, but this is a chronic issue for her.

## 2019-03-20 NOTE — Telephone Encounter (Signed)
[facility] Telemedicine Visit  Patient consented to have visit conducted via telephone.  Encounter participants: Patient: Rebecca Walter  Provider: Kathrene Alu  Others (if applicable): none  Chief Complaint: headache, hypertension, and anxiety  HPI:  Called Rebecca Walter this afternoon to discuss her high blood pressure and anxiety.  She reports that in the last few days, she has had a headache, which she attributes to Zofran.  She also feels stuffy in her nose and in her ears.  She thinks these symptoms are due to allergies and continues to take Claritin and Flonase daily.  Although meclizine was called in to her pharmacy on Friday, March 27 for her reported dizziness, she has not taken this medication due to her fear that this will interact with her other medications.  For her hypertension, she says that her blood pressure continues to run in the 150s/60s, which is about her baseline.  She continues to take amlodipine, indapamide, and valsartan.  She feels like her anxiety is a significant problem for her.  She is taking Ativan as needed and takes BuSpar every now and then, although she feels like BuSpar is not helpful for her.  We discussed that an SSRI or even an SNRI may be helpful for her, but she says that she has taken Prozac and Lexapro in the past without any relief and with significant side effects, including frequent crying.  She does say that she took these medications so long ago that she cannot remember what the exact side effects were, but she said that they did not work well for her.  She is afraid of changing any of her anxiety medicines at this time since her anxiety might worsen as a product of these changes.  ROS: no fevers, no confusion  Pertinent PMHx: hypertension, anxiety, type 2 diabetes  Exam:  Respiratory: Patient spoke in full sentences without audible shortness of breath.  No coughing was heard during our  conversation.  Assessment/Plan:  Hypertension We will continue patient's amlodipine, indapamide, and valsartan for now.  Blood pressure appears to be at her baseline.  She does have a wide pulse pressure, but this is a chronic issue for her.  Anxiety This continues to be a significant issue for her.  We discussed ways to mitigate anxiety, including reading and enjoyable book and calling her family members if she needs someone to talk to.  She understands that, while there are not many openings with me in the near future, she can call the clinic and I will return her calls as soon as I can.  We agreed to not make any changes right now, but she will consider the addition of an SSRI or SNRI and call the clinic if she decides to make this change.  We discussed that Ativan is only a short-term measure for her anxiety and is not indicated in patients her age.   Note: At her next visit, patient needs a TSH with free T4 measurement since previous TSH was high enough at a level of 20 to warrant treatment even though her T4 was within normal limits.  Time spent on phone with patient: 16 minutes

## 2019-03-21 ENCOUNTER — Telehealth: Payer: Self-pay | Admitting: Pharmacist

## 2019-03-21 ENCOUNTER — Other Ambulatory Visit: Payer: Self-pay | Admitting: *Deleted

## 2019-03-21 ENCOUNTER — Telehealth: Payer: Self-pay | Admitting: Family Medicine

## 2019-03-21 ENCOUNTER — Other Ambulatory Visit: Payer: Self-pay | Admitting: Family Medicine

## 2019-03-21 MED ORDER — MIRTAZAPINE 15 MG PO TABS: 15.0000 mg | ORAL_TABLET | Freq: Every day | ORAL | 0 refills | Status: DC

## 2019-03-21 NOTE — Telephone Encounter (Signed)
Called Ms. Kutsch to follow-up on her interested in starting an SSRI or SNRI for her anxiety.  Patient says that she is interested in controlling her anxiety but also wants something that will help her sleep.  We discussed possibly starting Zoloft, since this seems to be 1 of the better tolerated medications and works well in the geriatric population.  However, she was worried that she would have to wait too long for this medication to reach its full effect, and she says that she really wants something to help her sleep immediately.  Therefore, we decided to start mirtazapine 15 mg nightly.  She will also reduce the frequency and her Ativan and will take it every other night for a few times before stopping it completely.  I told her that we can increase this dose to 30 mg per night after a few weeks if she feels like she needs more of this medication.  I discussed with her that side effects can include dry mouth and increase in appetite, sometimes causing weight gain.  Patient expressed understanding.  Medication was sent to her pharmacy.  We will plan to call in a few days to update me on how it is working.

## 2019-03-21 NOTE — Telephone Encounter (Signed)
Patient reports she is not doing well.  She reports that she has had anxiety and has been prescribed a new anxiety medication today (mirtazepine).   I reinforced that this medication should help.   Her blood pressure readings are reported as 150-160 / 65-70.   We agreed to make no changed to her blood pressure medications at this time.   I agreed to follow up with her in 1 week.  She was appreciative of the call.

## 2019-03-22 ENCOUNTER — Telehealth: Payer: Self-pay | Admitting: Family Medicine

## 2019-03-22 MED ORDER — TORSEMIDE 10 MG PO TABS: 10.0000 mg | ORAL_TABLET | Freq: Every day | ORAL | 0 refills | Status: DC | PRN

## 2019-03-22 NOTE — Telephone Encounter (Signed)
Patient called this morning and had a few questions about her new anxiety medication. Please call patient back.

## 2019-03-23 ENCOUNTER — Other Ambulatory Visit: Payer: Self-pay

## 2019-03-23 ENCOUNTER — Telehealth (INDEPENDENT_AMBULATORY_CARE_PROVIDER_SITE_OTHER): Payer: Medicare Other | Admitting: Family Medicine

## 2019-03-23 DIAGNOSIS — F419 Anxiety disorder, unspecified: Secondary | ICD-10-CM

## 2019-03-23 MED ORDER — TRAZODONE HCL 50 MG PO TABS: 50.0000 mg | ORAL_TABLET | Freq: Every day | ORAL | 0 refills | Status: DC

## 2019-03-23 NOTE — Telephone Encounter (Signed)
Returned Rebecca Walter's call regarding her new medication, mirtazapine.  She says that she is not feeling very well because of her anxiety.  She called the clinic earlier today, and spoke with Dr. Ardelia Mems, who recommended that she start trazodone and stop mirtazapine since mirtazapine has not been working for her.  She says that she took the mirtazapine 15 mg dose on Wednesday night and Thursday night.  On Thursday night, she took mirtazapine with Ativan and, although she felt foggy in the morning, she felt like herself the rest of the day because she said she slept better.  She is worried that trazodone will not help her anxiety.  We discussed that her sleep and her anxiety is well as her intermittent diarrhea are likely all connected, and any medication that helps her to sleep better will likely help her anxiety and vice versa.  We also discussed that medication often takes time to reduce anxiety, so she will need to be patient to allow these medications to do their job.  She also would benefit from doing more relaxing activities at home, whether that is reading, meditation, prayer, or talking with family members over the phone.  We also discussed sleep hygiene, including adding melatonin and not watching TV right before bed.  She says that she would like to stick with the mirtazapine but try the higher dose, and I was agreeable to that plan.  We will try mirtazapine 30 mg nightly and hold off on trazodone for now and continue weaning down her Ativan.  I reiterated that she needs to give this medication time to have its full effect.  I encouraged her that we will continue working on this together.

## 2019-03-23 NOTE — Assessment & Plan Note (Addendum)
Still a significant issue, with concomitant insomnia. Patient is perseverative on the phone about needing anxiety medication and sleep medication. I think these two issues are interrelated.   Had planned to increase buspar to three times daily, but patient called back and wants to try new medication for sleep instead. Will start trazodone 50mg  at night. Stop mirtazapine since it is not helpful to her. Continue buspar 10mg  twice daily. Follow up virtual visit scheduled for Monday morning at 8:30am. At that visit if anxiety still prominent can go up on buspar to 10mg  three times daily, but I do not want to make too many changes at once so we are leaving buspar dose at twice daily for now. Patient agreeable to this plan.   If anxiety continues to be a significant issue we could consider placing her back on low dose ativan before bedtime (patient took ativan for years previously), though would really like to avoid this if at all possible given her age. Discussed risk of falls with ativan with patient.

## 2019-03-23 NOTE — Progress Notes (Signed)
[facility] Telemedicine Visit  Patient consented to have visit conducted via telephone.  Encounter participants: Patient: Rebecca Walter  Provider: Chrisandra Netters  Others (if applicable): n/a  Chief Complaint: anxiety  HPI:  Patient recently started on new medication for sleep (mirtazapine 15mg  at night). She took this with 1mg  of ativan on Wednesday night and slept well but was too groggy the next day. Last night took just the mirtazapine and was not able to sleep. The ativan was some she had left over from previously taking it - used to take this for years and was able to wean off of it last year. Reports being unable to tolerate antidepressants in the past. Does take buspar 10mg  twice daily which has been helpful up until now, when anxiety has recently flared. She is not sure she is tolerating the mirtazapine very well. Head feels kind of heavy. Denies SI/HI. Lays in bed at night and is not able to sleep because her mind is racing. She is not able to tell me what she specifically feels anxious about. Lays in bed most of the day or watches TV.  ROS: no SI/HI, + anxiety, + trouble sleeping  Pertinent PMHx: anxiety, chronic dCHF, hypertension, type 2 diabetes, GERD, hyperlipidemia   Exam:  Respiratory: Patient speaking normally in full sentences throughout the phone encounter, without any respiratory distress evident.  Psych: speech normal in rate and volume. Patient somewhat perseverative around needing sleep and needing medication for anxiety. No SI/HI. Sounds fatigued.  Assessment/Plan:  Anxiety Still a significant issue, with concomitant insomnia. Patient is perseverative on the phone about needing anxiety medication and sleep medication. I think these two issues are interrelated.   Had planned to increase buspar to three times daily, but patient called back and wants to try new medication for sleep instead. Will start trazodone 50mg  at night. Stop  mirtazapine since it is not helpful to her. Continue buspar 10mg  twice daily. Follow up virtual visit scheduled for Monday morning at 8:30am. At that visit if anxiety still prominent can go up on buspar to 10mg  three times daily, but I do not want to make too many changes at once so we are leaving buspar dose at twice daily for now. Patient agreeable to this plan.   If anxiety continues to be a significant issue we could consider placing her back on low dose ativan before bedtime (patient took ativan for years previously), though would really like to avoid this if at all possible given her age. Discussed risk of falls with ativan with patient.     Time spent on phone with patient: 22 minutes

## 2019-03-26 ENCOUNTER — Observation Stay (HOSPITAL_COMMUNITY)
Admission: EM | Admit: 2019-03-26 | Discharge: 2019-03-28 | Disposition: A | Discharge status: Skilled Nursing Facility | Payer: Medicare Other | Attending: Family Medicine | Admitting: Family Medicine

## 2019-03-26 ENCOUNTER — Telehealth: Payer: Medicare Other

## 2019-03-26 ENCOUNTER — Encounter (HOSPITAL_COMMUNITY): Payer: Self-pay | Admitting: Emergency Medicine

## 2019-03-26 ENCOUNTER — Other Ambulatory Visit: Payer: Self-pay

## 2019-03-26 ENCOUNTER — Telehealth: Payer: Self-pay | Admitting: Family Medicine

## 2019-03-26 DIAGNOSIS — E039 Hypothyroidism, unspecified: Secondary | ICD-10-CM | POA: Insufficient documentation

## 2019-03-26 DIAGNOSIS — M8580 Other specified disorders of bone density and structure, unspecified site: Secondary | ICD-10-CM | POA: Diagnosis not present

## 2019-03-26 DIAGNOSIS — Z79899 Other long term (current) drug therapy: Secondary | ICD-10-CM | POA: Insufficient documentation

## 2019-03-26 DIAGNOSIS — E1165 Type 2 diabetes mellitus with hyperglycemia: Secondary | ICD-10-CM | POA: Diagnosis not present

## 2019-03-26 DIAGNOSIS — F419 Anxiety disorder, unspecified: Secondary | ICD-10-CM | POA: Diagnosis not present

## 2019-03-26 DIAGNOSIS — Z833 Family history of diabetes mellitus: Secondary | ICD-10-CM | POA: Diagnosis not present

## 2019-03-26 DIAGNOSIS — K219 Gastro-esophageal reflux disease without esophagitis: Secondary | ICD-10-CM | POA: Diagnosis not present

## 2019-03-26 DIAGNOSIS — I110 Hypertensive heart disease with heart failure: Secondary | ICD-10-CM | POA: Insufficient documentation

## 2019-03-26 DIAGNOSIS — Z794 Long term (current) use of insulin: Secondary | ICD-10-CM | POA: Diagnosis not present

## 2019-03-26 DIAGNOSIS — J302 Other seasonal allergic rhinitis: Secondary | ICD-10-CM | POA: Diagnosis not present

## 2019-03-26 DIAGNOSIS — Z7982 Long term (current) use of aspirin: Secondary | ICD-10-CM | POA: Insufficient documentation

## 2019-03-26 DIAGNOSIS — E785 Hyperlipidemia, unspecified: Secondary | ICD-10-CM | POA: Diagnosis not present

## 2019-03-26 DIAGNOSIS — R110 Nausea: Secondary | ICD-10-CM

## 2019-03-26 DIAGNOSIS — Z8249 Family history of ischemic heart disease and other diseases of the circulatory system: Secondary | ICD-10-CM | POA: Diagnosis not present

## 2019-03-26 DIAGNOSIS — R2681 Unsteadiness on feet: Secondary | ICD-10-CM | POA: Diagnosis not present

## 2019-03-26 DIAGNOSIS — M109 Gout, unspecified: Secondary | ICD-10-CM | POA: Diagnosis not present

## 2019-03-26 DIAGNOSIS — Z9181 History of falling: Secondary | ICD-10-CM | POA: Insufficient documentation

## 2019-03-26 DIAGNOSIS — E1143 Type 2 diabetes mellitus with diabetic autonomic (poly)neuropathy: Secondary | ICD-10-CM | POA: Diagnosis not present

## 2019-03-26 DIAGNOSIS — M6281 Muscle weakness (generalized): Secondary | ICD-10-CM | POA: Insufficient documentation

## 2019-03-26 DIAGNOSIS — N179 Acute kidney failure, unspecified: Secondary | ICD-10-CM | POA: Insufficient documentation

## 2019-03-26 DIAGNOSIS — R1013 Epigastric pain: Secondary | ICD-10-CM | POA: Insufficient documentation

## 2019-03-26 DIAGNOSIS — G4700 Insomnia, unspecified: Secondary | ICD-10-CM | POA: Diagnosis not present

## 2019-03-26 DIAGNOSIS — Z888 Allergy status to other drugs, medicaments and biological substances status: Secondary | ICD-10-CM | POA: Insufficient documentation

## 2019-03-26 DIAGNOSIS — G8929 Other chronic pain: Secondary | ICD-10-CM | POA: Diagnosis not present

## 2019-03-26 DIAGNOSIS — R739 Hyperglycemia, unspecified: Secondary | ICD-10-CM

## 2019-03-26 DIAGNOSIS — E871 Hypo-osmolality and hyponatremia: Principal | ICD-10-CM

## 2019-03-26 DIAGNOSIS — E860 Dehydration: Secondary | ICD-10-CM | POA: Diagnosis not present

## 2019-03-26 DIAGNOSIS — I5032 Chronic diastolic (congestive) heart failure: Secondary | ICD-10-CM | POA: Diagnosis not present

## 2019-03-26 LAB — COMPREHENSIVE METABOLIC PANEL
ALT: 16 U/L (ref 0–44)
AST: 19 U/L (ref 15–41)
Albumin: 3.9 g/dL (ref 3.5–5.0)
Alkaline Phosphatase: 43 U/L (ref 38–126)
Anion gap: 16 — ABNORMAL HIGH (ref 5–15)
BUN: 26 mg/dL — ABNORMAL HIGH (ref 8–23)
CO2: 19 mmol/L — ABNORMAL LOW (ref 22–32)
Calcium: 9.3 mg/dL (ref 8.9–10.3)
Chloride: 88 mmol/L — ABNORMAL LOW (ref 98–111)
Creatinine, Ser: 1.03 mg/dL — ABNORMAL HIGH (ref 0.44–1.00)
GFR calc Af Amer: 60 mL/min — ABNORMAL LOW (ref >60)
GFR calc non Af Amer: 52 mL/min — ABNORMAL LOW (ref >60)
Glucose, Bld: 264 mg/dL — ABNORMAL HIGH (ref 70–99)
Potassium: 3.7 mmol/L (ref 3.5–5.1)
Sodium: 123 mmol/L — ABNORMAL LOW (ref 135–145)
Total Bilirubin: 0.4 mg/dL (ref 0.3–1.2)
Total Protein: 7 g/dL (ref 6.5–8.1)

## 2019-03-26 LAB — TSH: TSH: 5.368 u[IU]/mL — ABNORMAL HIGH (ref 0.350–4.500)

## 2019-03-26 LAB — CBC WITH DIFFERENTIAL/PLATELET
Abs Immature Granulocytes: 0.06 10*3/uL (ref 0.00–0.07)
Basophils Absolute: 0 10*3/uL (ref 0.0–0.1)
Basophils Relative: 1 %
Eosinophils Absolute: 0.2 10*3/uL (ref 0.0–0.5)
Eosinophils Relative: 2 %
HCT: 36 % (ref 36.0–46.0)
Hemoglobin: 12.7 g/dL (ref 12.0–15.0)
Immature Granulocytes: 1 %
Lymphocytes Relative: 24 %
Lymphs Abs: 2 10*3/uL (ref 0.7–4.0)
MCH: 31.2 pg (ref 26.0–34.0)
MCHC: 35.3 g/dL (ref 30.0–36.0)
MCV: 88.5 fL (ref 80.0–100.0)
Monocytes Absolute: 1 10*3/uL (ref 0.1–1.0)
Monocytes Relative: 12 %
Neutro Abs: 5.1 10*3/uL (ref 1.7–7.7)
Neutrophils Relative %: 60 %
Platelets: 240 10*3/uL (ref 150–400)
RBC: 4.07 MIL/uL (ref 3.87–5.11)
RDW: 12.6 % (ref 11.5–15.5)
WBC: 8.4 10*3/uL (ref 4.0–10.5)
nRBC: 0 % (ref 0.0–0.2)

## 2019-03-26 LAB — GLUCOSE, CAPILLARY
Glucose-Capillary: 131 mg/dL — ABNORMAL HIGH (ref 70–99)
Glucose-Capillary: 165 mg/dL — ABNORMAL HIGH (ref 70–99)
Glucose-Capillary: 203 mg/dL — ABNORMAL HIGH (ref 70–99)

## 2019-03-26 LAB — BASIC METABOLIC PANEL
Anion gap: 10 (ref 5–15)
BUN: 20 mg/dL (ref 8–23)
CO2: 25 mmol/L (ref 22–32)
Calcium: 9.1 mg/dL (ref 8.9–10.3)
Chloride: 95 mmol/L — ABNORMAL LOW (ref 98–111)
Creatinine, Ser: 1.05 mg/dL — ABNORMAL HIGH (ref 0.44–1.00)
GFR calc Af Amer: 58 mL/min — ABNORMAL LOW (ref >60)
GFR calc non Af Amer: 50 mL/min — ABNORMAL LOW (ref >60)
Glucose, Bld: 166 mg/dL — ABNORMAL HIGH (ref 70–99)
Potassium: 3.9 mmol/L (ref 3.5–5.1)
Sodium: 130 mmol/L — ABNORMAL LOW (ref 135–145)

## 2019-03-26 LAB — TROPONIN I: Troponin I: <0.03 ng/mL (ref <0.03)

## 2019-03-26 LAB — HEMOGLOBIN A1C
Hgb A1c MFr Bld: 7 % — ABNORMAL HIGH (ref 4.8–5.6)
Mean Plasma Glucose: 154.2 mg/dL

## 2019-03-26 LAB — LIPASE, BLOOD: Lipase: 39 U/L (ref 11–51)

## 2019-03-26 LAB — CBG MONITORING, ED: Glucose-Capillary: 283 mg/dL — ABNORMAL HIGH (ref 70–99)

## 2019-03-26 LAB — T4, FREE: Free T4: 1 ng/dL (ref 0.82–1.77)

## 2019-03-26 MED ORDER — TORSEMIDE 20 MG PO TABS
10.0000 mg | ORAL_TABLET | Freq: Every day | ORAL | Status: DC
  Administered 2019-03-26 – 2019-03-28 (×3): 10 mg via ORAL
  Filled 2019-03-26 (×3): qty 1

## 2019-03-26 MED ORDER — INSULIN ASPART 100 UNIT/ML ~~LOC~~ SOLN
0.0000 [IU] | Freq: Every day | SUBCUTANEOUS | Status: DC
  Administered 2019-03-26: 2 [IU] via SUBCUTANEOUS

## 2019-03-26 MED ORDER — BUSPIRONE HCL 10 MG PO TABS: 10.0000 mg | ORAL_TABLET | Freq: Two times a day (BID) | ORAL | Status: DC

## 2019-03-26 MED ORDER — PRAVASTATIN SODIUM 40 MG PO TABS
40.0000 mg | ORAL_TABLET | Freq: Every day | ORAL | Status: DC
  Administered 2019-03-26 – 2019-03-27 (×2): 40 mg via ORAL
  Filled 2019-03-26 (×2): qty 1

## 2019-03-26 MED ORDER — IRBESARTAN 300 MG PO TABS
300.0000 mg | ORAL_TABLET | Freq: Every day | ORAL | Status: DC
  Administered 2019-03-26 – 2019-03-28 (×3): 300 mg via ORAL
  Filled 2019-03-26 (×3): qty 1

## 2019-03-26 MED ORDER — ALLOPURINOL 100 MG PO TABS
200.0000 mg | ORAL_TABLET | Freq: Every day | ORAL | Status: DC
  Administered 2019-03-26 – 2019-03-28 (×3): 200 mg via ORAL
  Filled 2019-03-26 (×3): qty 2

## 2019-03-26 MED ORDER — SPIRONOLACTONE 25 MG PO TABS
25.0000 mg | ORAL_TABLET | Freq: Every day | ORAL | Status: DC
  Administered 2019-03-26 – 2019-03-28 (×3): 25 mg via ORAL
  Filled 2019-03-26 (×3): qty 1

## 2019-03-26 MED ORDER — LORAZEPAM 2 MG/ML IJ SOLN
0.5000 mg | Freq: Once | INTRAMUSCULAR | Status: AC
  Administered 2019-03-26: 0.5 mg via INTRAVENOUS
  Filled 2019-03-26: qty 1

## 2019-03-26 MED ORDER — AMLODIPINE BESYLATE 5 MG PO TABS
10.0000 mg | ORAL_TABLET | Freq: Every day | ORAL | Status: DC
  Administered 2019-03-26 – 2019-03-28 (×3): 10 mg via ORAL
  Filled 2019-03-26 (×3): qty 2

## 2019-03-26 MED ORDER — INSULIN GLARGINE 100 UNIT/ML ~~LOC~~ SOLN
70.0000 [IU] | Freq: Every day | SUBCUTANEOUS | Status: DC
  Administered 2019-03-26 – 2019-03-28 (×3): 70 [IU] via SUBCUTANEOUS
  Filled 2019-03-26 (×4): qty 0.7

## 2019-03-26 MED ORDER — ACETAMINOPHEN 325 MG PO TABS: 325.0000 mg | ORAL_TABLET | Freq: Three times a day (TID) | ORAL | Status: DC | PRN

## 2019-03-26 MED ORDER — SODIUM CHLORIDE 0.9 % IV BOLUS
1000.0000 mL | Freq: Once | INTRAVENOUS | Status: AC
  Administered 2019-03-26: 1000 mL via INTRAVENOUS

## 2019-03-26 MED ORDER — ENOXAPARIN SODIUM 40 MG/0.4ML ~~LOC~~ SOLN
40.0000 mg | SUBCUTANEOUS | Status: DC
  Administered 2019-03-26 – 2019-03-28 (×3): 40 mg via SUBCUTANEOUS
  Filled 2019-03-26 (×4): qty 0.4

## 2019-03-26 MED ORDER — INDAPAMIDE 1.25 MG PO TABS: 1.2500 mg | ORAL_TABLET | Freq: Every day | ORAL | Status: DC

## 2019-03-26 MED ORDER — INSULIN ASPART 100 UNIT/ML ~~LOC~~ SOLN
0.0000 [IU] | Freq: Three times a day (TID) | SUBCUTANEOUS | Status: DC
  Administered 2019-03-26: 1 [IU] via SUBCUTANEOUS
  Administered 2019-03-26: 2 [IU] via SUBCUTANEOUS
  Administered 2019-03-27 (×2): 1 [IU] via SUBCUTANEOUS
  Administered 2019-03-27: 2 [IU] via SUBCUTANEOUS
  Administered 2019-03-28: 1 [IU] via SUBCUTANEOUS

## 2019-03-26 MED ORDER — BUSPIRONE HCL 5 MG PO TABS
15.0000 mg | ORAL_TABLET | Freq: Two times a day (BID) | ORAL | Status: DC
  Administered 2019-03-26 – 2019-03-28 (×4): 15 mg via ORAL
  Filled 2019-03-26 (×4): qty 3

## 2019-03-26 MED ORDER — PANTOPRAZOLE SODIUM 40 MG PO TBEC
40.0000 mg | DELAYED_RELEASE_TABLET | Freq: Two times a day (BID) | ORAL | Status: DC
  Administered 2019-03-26 – 2019-03-28 (×4): 40 mg via ORAL
  Filled 2019-03-26 (×4): qty 1

## 2019-03-26 MED ORDER — TRAZODONE HCL 50 MG PO TABS
50.0000 mg | ORAL_TABLET | Freq: Every day | ORAL | Status: DC
  Administered 2019-03-26 – 2019-03-27 (×2): 50 mg via ORAL
  Filled 2019-03-26 (×2): qty 1

## 2019-03-26 MED ORDER — INSULIN ASPART 100 UNIT/ML ~~LOC~~ SOLN
5.0000 [IU] | Freq: Once | SUBCUTANEOUS | Status: AC
  Administered 2019-03-26: 5 [IU] via SUBCUTANEOUS

## 2019-03-26 MED ORDER — ONDANSETRON HCL 4 MG/2ML IJ SOLN
4.0000 mg | Freq: Once | INTRAMUSCULAR | Status: AC
  Administered 2019-03-26: 08:00:00 4 mg via INTRAVENOUS
  Filled 2019-03-26: qty 2

## 2019-03-26 MED ORDER — CARVEDILOL 3.125 MG PO TABS
3.1250 mg | ORAL_TABLET | Freq: Two times a day (BID) | ORAL | Status: DC
  Administered 2019-03-26 – 2019-03-28 (×4): 3.125 mg via ORAL
  Filled 2019-03-26 (×4): qty 1

## 2019-03-26 MED ORDER — ASPIRIN 81 MG PO CHEW
81.0000 mg | CHEWABLE_TABLET | Freq: Every day | ORAL | Status: DC
  Administered 2019-03-26 – 2019-03-28 (×3): 81 mg via ORAL
  Filled 2019-03-26 (×3): qty 1

## 2019-03-26 MED ORDER — LORATADINE 10 MG PO TABS
10.0000 mg | ORAL_TABLET | Freq: Every day | ORAL | Status: DC
  Administered 2019-03-26 – 2019-03-28 (×3): 10 mg via ORAL
  Filled 2019-03-26 (×3): qty 1

## 2019-03-26 MED ORDER — SERTRALINE HCL 20 MG/ML PO CONC: 25.0000 mg | Freq: Every day | ORAL | Status: DC

## 2019-03-26 MED ORDER — FAMOTIDINE 20 MG PO TABS
20.0000 mg | ORAL_TABLET | Freq: Every day | ORAL | Status: DC
  Administered 2019-03-26 – 2019-03-28 (×3): 20 mg via ORAL
  Filled 2019-03-26 (×3): qty 1

## 2019-03-26 MED ORDER — SERTRALINE HCL 50 MG PO TABS
25.0000 mg | ORAL_TABLET | Freq: Every day | ORAL | Status: DC
  Administered 2019-03-26 – 2019-03-28 (×3): 25 mg via ORAL
  Filled 2019-03-26 (×3): qty 1

## 2019-03-26 MED ORDER — SODIUM CHLORIDE 0.9 % IV SOLN
INTRAVENOUS | Status: DC
  Administered 2019-03-26: 11:00:00 via INTRAVENOUS

## 2019-03-26 MED ORDER — ONDANSETRON HCL 4 MG PO TABS
4.0000 mg | ORAL_TABLET | Freq: Three times a day (TID) | ORAL | Status: DC | PRN
  Administered 2019-03-27 – 2019-03-28 (×2): 4 mg via ORAL
  Filled 2019-03-26 (×3): qty 1

## 2019-03-26 NOTE — ED Notes (Addendum)
ED TO INPATIENT HANDOFF REPORT  ED Nurse Name and Phone #:    S Name/Age/Gender Rebecca Walter 78 y.o. female Room/Bed: 029C/029C  Code Status   Code Status: Full Code  Home/SNF/Other Home Patient oriented to: self, place, time and situation Is this baseline? Yes   Triage Complete: Triage complete  Chief Complaint nausea; hyperglycemia   Triage Note Patient arrived from home via [facility] with reports nausea that started at 1am. She reports that the feeling woke her up from sleep. When asked to describe how she is feeling patient stated "just feeling sick" as she waves her hand over her abdomen. Used OTC and zofran without results,  Denies vomiting, denies fever, denies shob, denies chest pain. EMS reports hyperglycemia, stating her CBG was 364 On arrival to ED, cbg is 283   Allergies Allergies  Allergen Reactions  . Baclofen Other (See Comments)    Caused leg weakness  . Hctz [Hydrochlorothiazide] Other (See Comments)    Uric acid elevation and gout.     . Lotensin [Benazepril] Other (See Comments)    Dry cough   . Victoza [Liraglutide] Other (See Comments)    Tongue Glossitus  . Acyclovir And Related Diarrhea  . Beta Adrenergic Blockers Other (See Comments)    Occurred with metoprolol  REACTION: coughing  . Losartan Diarrhea    Level of Care/Admitting Diagnosis ED Disposition    ED Disposition Condition [facility] Area: [place] [[id number]]  Level of Care: Med-Surg [16]  Diagnosis: Dehydration [276.51.ICD-9-CM]  Admitting Physician: Kathrene Alu [[id number]]  Attending Physician: Erin Hearing, Herman Fiero L [[id number]  PT Class (Do Not Modify): Observation [104]  PT Acc Code (Do Not Modify): Observation [10022]       B Medical/Surgery History Past Medical History:  Diagnosis Date  . Anxiety   . Arthritis   . Chronic diastolic CHF (congestive heart failure) ([place])   . Diabetes mellitus   . GERD (gastroesophageal reflux disease)    . Gout   . Hyperlipidemia   . Hypertension   . Hypertensive heart disease with CHF (congestive heart failure) ([place])   . Mild aortic stenosis 06/2017  . Mild mitral regurgitation 06/2017  . Moderate pulmonary valve insufficiency 06/2017  . Osteopenia    Past Surgical History:  Procedure Laterality Date  . KIDNEY SURGERY    . KNEE SURGERY    . MENISECTOMY    . WRIST SURGERY       A IV Location/Drains/Wounds Patient Lines/Drains/Airways Status   Active Line/Drains/Airways    Name:   Placement date:   Placement time:   Site:   Days:   Peripheral IV 03/26/19 Left Hand   03/26/19    0752    Hand   less than 1          Intake/Output Last 24 hours No intake or output data in the 24 hours ending 03/26/19 1036  Labs/Imaging Results for orders placed or performed during the hospital encounter of 03/26/19 (from the past 48 hour(s))  CBG monitoring, ED     Status: Abnormal   Collection Time: 03/26/19  7:17 AM  Result Value Ref Range   Glucose-Capillary 283 (H) 70 - 99 mg/dL  Comprehensive metabolic panel     Status: Abnormal   Collection Time: 03/26/19  7:48 AM  Result Value Ref Range   Sodium 123 (L) 135 - 145 mmol/L   Potassium 3.7 3.5 - 5.1 mmol/L   Chloride 88 (L) 98 - 111  mmol/L   CO2 19 (L) 22 - 32 mmol/L   Glucose, Bld 264 (H) 70 - 99 mg/dL   BUN 26 (H) 8 - 23 mg/dL   Creatinine, Ser 1.03 (H) 0.44 - 1.00 mg/dL   Calcium 9.3 8.9 - 10.3 mg/dL   Total Protein 7.0 6.5 - 8.1 g/dL   Albumin 3.9 3.5 - 5.0 g/dL   AST 19 15 - 41 U/L   ALT 16 0 - 44 U/L   Alkaline Phosphatase 43 38 - 126 U/L   Total Bilirubin 0.4 0.3 - 1.2 mg/dL   GFR calc non Af Amer 52 (L) >60 mL/min   GFR calc Af Amer 60 (L) >60 mL/min   Anion gap 16 (H) 5 - 15    Comment: Performed at [facility] [address] [place], [place] [address]  CBC with Differential     Status: None   Collection Time: 03/26/19  7:48 AM  Result Value Ref Range   WBC 8.4 4.0 - 10.5 K/uL   RBC 4.07 3.87 - 5.11  MIL/uL   Hemoglobin 12.7 12.0 - 15.0 g/dL   HCT 36.0 36.0 - 46.0 %   MCV 88.5 80.0 - 100.0 fL   MCH 31.2 26.0 - 34.0 pg   MCHC 35.3 30.0 - 36.0 g/dL   RDW 12.6 11.5 - 15.5 %   Platelets 240 150 - 400 K/uL   nRBC 0.0 0.0 - 0.2 %   Neutrophils Relative % 60 %   Neutro Abs 5.1 1.7 - 7.7 K/uL   Lymphocytes Relative 24 %   Lymphs Abs 2.0 0.7 - 4.0 K/uL   Monocytes Relative 12 %   Monocytes Absolute 1.0 0.1 - 1.0 K/uL   Eosinophils Relative 2 %   Eosinophils Absolute 0.2 0.0 - 0.5 K/uL   Basophils Relative 1 %   Basophils Absolute 0.0 0.0 - 0.1 K/uL   Immature Granulocytes 1 %   Abs Immature Granulocytes 0.06 0.00 - 0.07 K/uL    Comment: Performed at [facility], [address], [place], [place] [address]  Troponin I - Once     Status: None   Collection Time: 03/26/19  7:48 AM  Result Value Ref Range   Troponin I <0.03 <0.03 ng/mL    Comment: Performed at [facility] [address], [place], [place] [address]  Lipase, blood     Status: None   Collection Time: 03/26/19  7:48 AM  Result Value Ref Range   Lipase 39 11 - 51 U/L    Comment: Performed at [facility] [address], [place],  [address]   No results found.  Pending Labs Unresulted Labs (From admission, onward)    Start     Ordered   03/27/19 9622  Basic metabolic panel  Tomorrow morning,   R     03/26/19 1006   03/26/19 2979  Basic metabolic panel  Once-Timed,   R     03/26/19 1006   03/26/19 1026  Hemoglobin A1c  Once,   R     03/26/19 1025   03/26/19 1003  TSH  Once,   R     03/26/19 1006   03/26/19 0912  Urinalysis, Routine w reflex microscopic  ONCE - STAT,   STAT     03/26/19 0911          Vitals/Pain Today's Vitals   03/26/19 0730 03/26/19 0800 03/26/19 0807 03/26/19 1000  BP: 104/71  (!) 139/50 Marland Kitchen)  152/66  Pulse: 63 60 (!) 59 60  Resp: 17 (!) 21 20 (!) 23  Temp:      TempSrc:      SpO2: 98% 97% 93% 95%  Weight:      Height:      PainSc:        Isolation  Precautions No active isolations  Medications Medications  acetaminophen (TYLENOL) tablet 325 mg (has no administration in time range)  allopurinol (ZYLOPRIM) tablet 200 mg (has no administration in time range)  aspirin chewable tablet 81 mg (has no administration in time range)  amLODipine (NORVASC) tablet 10 mg (has no administration in time range)  carvedilol (COREG) tablet 3.125 mg (has no administration in time range)  pravastatin (PRAVACHOL) tablet 40 mg (has no administration in time range)  indapamide (LOZOL) tablet 1.25 mg (has no administration in time range)  spironolactone (ALDACTONE) tablet 25 mg (has no administration in time range)  torsemide (DEMADEX) tablet 10 mg (has no administration in time range)  irbesartan (AVAPRO) tablet 300 mg (has no administration in time range)  busPIRone (BUSPAR) tablet 10 mg (has no administration in time range)  famotidine (PEPCID) tablet 20 mg (has no administration in time range)  ondansetron (ZOFRAN) tablet 4 mg (has no administration in time range)  pantoprazole (PROTONIX) EC tablet 40 mg (has no administration in time range)  loratadine (CLARITIN) tablet 10 mg (has no administration in time range)  enoxaparin (LOVENOX) injection 40 mg (has no administration in time range)  insulin aspart (novoLOG) injection 0-5 Units (has no administration in time range)  insulin aspart (novoLOG) injection 0-9 Units (has no administration in time range)  insulin glargine (LANTUS) injection 70 Units (has no administration in time range)  0.9 %  sodium chloride infusion (has no administration in time range)  sodium chloride 0.9 % bolus 1,000 mL (1,000 mLs Intravenous New Bag/Given 03/26/19 0752)  ondansetron (ZOFRAN) injection 4 mg (4 mg Intravenous Given 03/26/19 0809)  LORazepam (ATIVAN) injection 0.5 mg (0.5 mg Intravenous Given 03/26/19 0809)  insulin aspart (novoLOG) injection 5 Units (5 Units Subcutaneous Given 03/26/19 1002)    Mobility walks with  person assist Low fall risk               R Recommendations: See Admitting Provider Note  Report given to:   Additional Notes:   Patient presenting with nausea without vomiting that began last night, and hyperglycemia per EMS.  On arrival, patient is diaphoretic, however not in distress.  Vital signs are stable.  Abdomen is soft and nontender.  Patient is not complaining of chest pain or respiratory symptoms.  Hyperglycemic at 283.  Labs and EKG obtained.  IV fluids initiated.  Antiemetics.  CBC is reassuring without leukocytosis.  Metabolic panel does show electrolyte abnormalities consistent with dehydration versus early DKA.  Corrected sodium is low at 126.  Bicarb is 19, anion gap is 16.  Troponin is within normal limits.  Patient will be admitted to the family medicine service for IV hydration and electrolyte correction.

## 2019-03-26 NOTE — ED Triage Notes (Addendum)
Patient arrived from home via [facility] with reports nausea that started at 1am. She reports that the feeling woke her up from sleep. When asked to describe how she is feeling patient stated "just feeling sick" as she waves her hand over her abdomen. Used OTC and zofran without results,  Denies vomiting, denies fever, denies shob, denies chest pain. EMS reports hyperglycemia, stating her CBG was 364 On arrival to ED, cbg is 283

## 2019-03-26 NOTE — Telephone Encounter (Signed)
**  After Hours/ Emergency Line Call**  Received a call to report that Rebecca Walter nausea.  Patient states she feels sick to her stomach. Patient tried to use nausea medication but is unable to tolerate PO. Has not tolerated liquids or solids. Symptoms began yesterday evening and lasted throughout the night. Patient states no emesis but continues to have nausea. Pain is "in the middle" of abdomen. Denies radiation of pain. Denies fever. Denies chills or cough. Denies diarrhea. Denies sick contacts. Denies CP or SOB.  Discussed with patient importance of fluid intake. Patient is already on max dose reglan and zofran. Explained to patient that if she is unable to take PO she needs to come to clinic or ED to have IV hydration. Red flags discussed.  Will forward to PCP.    Caroline More, DO PGY-2, [facility] 03/26/2019 5:24 AM

## 2019-03-26 NOTE — H&P (Signed)
[facility] Admission History and Physical Service Pager: [phone]  Patient name: Rebecca Walter Medical record number: [id number] Date of birth: Sep 12, 1941 Age: 78 y.o. Gender: female  Primary Care Provider: Kathrene Alu, MD Consultants: none Code Status: FULL  Chief Complaint: Abdominal pain  Assessment and Plan: Rebecca Walter is a 78 y.o. female presenting with abdominal pain and poor p.o. intake. PMH is significant for insulin-dependent type 2 diabetes mellitus, HFpEF, hypertension, GERD, and anxiety.  Hyponatremia 2/2 abdominal pain and poor p.o. intake: Patient reports that her appetite and p.o. intake have been reduced over the last week, but she has been drinking plenty of water.  She reports a "grinding, aching" epigastric abdominal pain and nausea that have prevented her from feeling able to eat much, although she denies recent diarrhea or vomiting.  CMP notable for sodium of 123, corrected to 126 given glucose of 264, chloride 88, bicarb 19.  CBC, troponin, lipase are within normal limits.  Electrolyte abnormalities likely due to consumption of water without much consumption of much food over the last few days.  Hyponatremia could also possibly due to subclinical hypothyroidism since patient's last TSH was 20.  Patient's multiple diuretics could also be contributing to her hyponatremia, and indapamide was recently added to her regimen. -Admit to [facility], attending Dr. Erin Hearing -Status post normal saline 1 L bolus in the ED, start normal saline infusion 100 mL/h x 8 hours -Continue Zofran 4 mg every 8 hours as needed -Called [facility] regarding possible inpatient work-up of abdominal pain.  They will not do an endoscopy here and suggested ensuring that she takes Protonix 40 mg twice daily with meals and Pepcid at a separate time from her Protonix. -Measure TSH and free T3/free T4 -Follow-up BMP at 1700 and tomorrow morning -Patient counseled to  reduce free water intake  Hyperglycemia in setting of type 2 diabetes mellitus: Patient's diabetes is usually well controlled, although she says she will get measurements of her glucose up to 200 at home.  Last A1c was 6.5 in January 2020.  Patient takes Lantus 70 units each morning and NovoLog at home and is compliant with her medications.  Her last insulin administration was NovoLog 7 units last night, and she received NovoLog 5 units upon admission to the emergency department.  Although anion gap is 16, patient is unlikely to be in DKA since her sugars are not high enough, and she has been taking insulin regularly.  It is possible that patient's hyperglycemia could be -Administer home dose of Lantus 70 units daily -Sensitive sliding scale -Carb modified diet  Hypertension, controlled: Patient takes amlodipine 10 mg, indapamide 1.25 mg, carvedilol 3.125 mg twice daily, and valsartan 320 mg daily.  BP has ranged from 104/71-159/60 so far this admission. -Continue home medications  HFpEF: Echo in 2018 showed EF of 60 to 65% with grade 1 diastolic dysfunction.  No major valvular dysfunction.  Patient takes carvedilol and torsemide for her heart failure as well as the blood pressure medications listed above.  Patient does not appear volume overloaded on exam. -Continue torsemide 10 mg daily and medications listed above  Anxiety: Patient has significant anxiety that may manifest in IBS symptoms and has also caused her to have insomnia.  Patient takes BuSpar 10 mg twice daily.  She has recently tried mirtazapine 30 mg nightly for anxiety and sleep without any improvement. -Increase BuSpar dose to 15 mg twice daily -Start Zoloft 25 mg each morning -Start trazodone 50  mg nightly  GERD: Patient likely has functional dyspepsia given her acid reflux symptoms as well as the abdominal pain she is describing.  She takes Pepcid 20 mg daily and Protonix 40 mg twice daily.  Given her age, she would warrant an  endoscopy to further investigate this, but it is unsure whether this is needs to be done inpatient or outpatient, although patient would like further work-up inpatient.  Follows with [facility]. -Continue home Protonix and Pepcid -Follow-up GI recommendations  FEN/GI: Carb modified diet Prophylaxis: Lovenox  Disposition: Likely home  History of Present Illness:  Rebecca Walter is a 78 y.o. female presenting with hyponatremia in the setting of abdominal pain and poor p.o. intake.  She has had abdominal pain that she describes as a gnawing, aching pain in her epigastric region that is worse on empty stomach for about 1 week, although she has chronic abdominal pain as well.  The pain makes it difficult for her to eat and causes some slight nausea, but she denies recent vomiting or diarrhea.  She does have chronic diarrhea and constipation, but she has not had diarrhea in the last few days.  She says that she has had a poor appetite over the last few days but has had plenty of water.  She does say that she ate broccoli, peas, and some meat yesterday.  She is also struggled with significant anxiety and has not slept well.  She checks her blood sugars and blood pressure frequently at home and is compliant with her insulin regimen.  She took 7 units of NovoLog last night and took her daily Lantus 70 units yesterday morning.  She says that her blood sugars sometimes run up to 200 but are usually in the low to mid 100s at home.  Review Of Systems: Per HPI with the following additions:   Review of Systems  Constitutional: Negative for chills, diaphoresis, fever, malaise/fatigue and weight loss.  HENT: Positive for congestion.   Respiratory: Negative for cough, shortness of breath and wheezing.   Cardiovascular: Negative for chest pain, orthopnea and leg swelling.  Gastrointestinal: Positive for abdominal pain, heartburn and nausea. Negative for blood in stool, constipation, diarrhea and vomiting.   Genitourinary: Negative for dysuria.  Psychiatric/Behavioral: The patient is nervous/anxious and has insomnia.     Patient Active Problem List   Diagnosis Date Noted  . Diabetic autonomic neuropathy ([place]) 01/24/2019  . Generalized weakness 12/15/2018  . Chronically dry eyes, right 12/15/2018  . Hypertension 10/17/2018  . Elevated TSH 11/21/2017  . Hypertensive heart disease with CHF (congestive heart failure) ([place])   . Chronic diastolic CHF (congestive heart failure) ([place]) 07/15/2017  . Mild aortic stenosis 06/19/2017  . Mild mitral regurgitation 06/19/2017  . Moderate pulmonary valve insufficiency 06/19/2017  . Lumbar back pain with radiculopathy affecting right lower extremity 10/29/2016  . Vertigo 08/29/2013  . Allergic rhinitis 03/22/2011  . INSOMNIA 09/16/2009  . GASTROPARESIS 08/12/2009  . Anxiety 10/18/2008  . DYSKINESIA, ESOPHAGUS 09/13/2007  . CARCINOMA, BASAL CELL 05/26/2007  . Diabetes mellitus, type II ([place]) 02/16/2007  . HLD (hyperlipidemia) 02/16/2007  . OBESITY, NOS 02/16/2007  . GASTROESOPHAGEAL REFLUX, NO ESOPHAGITIS 02/16/2007  . OSTEOARTHRITIS, MULTI SITES 02/16/2007  . Osteopenia after menopause 02/16/2007  . Gout 02/16/2007    Past Medical History: Past Medical History:  Diagnosis Date  . Anxiety   . Arthritis   . Chronic diastolic CHF (congestive heart failure) ([place])   . Diabetes mellitus   . GERD (gastroesophageal reflux disease)   .  Gout   . Hyperlipidemia   . Hypertension   . Hypertensive heart disease with CHF (congestive heart failure) ([place])   . Mild aortic stenosis 06/2017  . Mild mitral regurgitation 06/2017  . Moderate pulmonary valve insufficiency 06/2017  . Osteopenia     Past Surgical History: Past Surgical History:  Procedure Laterality Date  . KIDNEY SURGERY    . KNEE SURGERY    . MENISECTOMY    . WRIST SURGERY      Social History: Social History   Tobacco Use  . Smoking status: Never Smoker  . Smokeless tobacco:  Never Used  Substance Use Topics  . Alcohol use: No  . Drug use: No   Additional social history:   Please also refer to relevant sections of EMR.  Family History: Family History  Problem Relation Age of Onset  . Stroke Mother   . Heart disease Father   . Stroke Father   . Hypertension Father   . Cancer Sister   . Cancer Brother   . Heart disease Brother   . Diabetes Maternal Uncle    (If not completed, MUST add something in)  Allergies and Medications: Allergies  Allergen Reactions  . Baclofen Other (See Comments)    Caused leg weakness  . Hctz [Hydrochlorothiazide] Other (See Comments)    Uric acid elevation and gout.     . Lotensin [Benazepril] Other (See Comments)    Dry cough   . Victoza [Liraglutide] Other (See Comments)    Tongue Glossitus  . Acyclovir And Related Diarrhea  . Beta Adrenergic Blockers Other (See Comments)    Occurred with metoprolol  REACTION: coughing  . Losartan Diarrhea   No current facility-administered medications on file prior to encounter.    Current Outpatient Medications on File Prior to Encounter  Medication Sig Dispense Refill  . acetaminophen (TYLENOL) 325 MG tablet Take 325 mg by mouth every 8 (eight) hours as needed (pain).     Marland Kitchen allopurinol (ZYLOPRIM) 100 MG tablet Take 2 tablets (200 mg total) by mouth daily. 60 tablet 8  . alum & mag hydroxide-simeth (MAALOX/MYLANTA) 200-200-20 MG/5ML suspension Take 30 mLs by mouth every 6 (six) hours as needed for indigestion or heartburn. (Patient not taking: Reported on 03/08/2019) 355 mL 0  . amLODipine (NORVASC) 10 MG tablet Take 10 mg by mouth daily.    Marland Kitchen amLODipine (NORVASC) 2.5 MG tablet TAKE 1 TABLET BY MOUTH EVERY DAY (Patient not taking: Reported on 03/08/2019) 90 tablet 0  . aspirin 81 MG chewable tablet Chew 1 tablet (81 mg total) by mouth daily. 30 tablet 5  . azelastine (ASTELIN) 0.1 % nasal spray Place 1 spray into both nostrils 2 (two) times daily. Use in each nostril as directed  (Patient not taking: Reported on 03/08/2019) 30 mL 3  . baclofen (LIORESAL) 10 MG tablet Take 10 mg by mouth 3 (three) times daily.    . Blood Glucose Monitoring Suppl (ACCU-CHEK AVIVA PLUS) w/Device KIT 1 Device by Does not apply route daily. 1 kit 3  . busPIRone (BUSPAR) 10 MG tablet TAKE 1 TABLET BY MOUTH TWICE A DAY 60 tablet 2  . Camphor-Eucalyptus-Menthol (VICKS VAPORUB EX) Place 1 application into both nostrils daily as needed (congestion).    . carvedilol (COREG) 3.125 MG tablet TAKE 1 TABLET (3.125 MG TOTAL) BY MOUTH 2 (TWO) TIMES DAILY WITH A MEAL. 60 tablet 3  . famotidine (PEPCID) 20 MG tablet Take 20 mg by mouth daily.    . fluticasone (FLONASE)  50 MCG/ACT nasal spray Place 2 sprays into both nostrils daily. (Patient not taking: Reported on 03/08/2019) 16 g 6  . glucose blood (ACCU-CHEK AVIVA PLUS) test strip CHECK BLOOD SUGAR FIRST  THING IN THE MORNING BEFORE EATING, AFTER LUNCH, AND  AFTER DINNER TOTAL 3 TIMES  DAILY 300 each 3  . indapamide (LOZOL) 1.25 MG tablet Take 1 tablet (1.25 mg total) by mouth daily. 30 tablet 5  . insulin aspart (NOVOLOG FLEXPEN) 100 UNIT/ML FlexPen Inject 9-18 Units into the skin See admin instructions. Inject 15 units subcutaneously daily with breakfast, inject 9 units with lunch as needed for CBG 180 or more, and inject 9 units with supper 15 mL 3  . Insulin Glargine (LANTUS SOLOSTAR) 100 UNIT/ML Solostar Pen INJECT 70 UNITS INTO THE SKIN EVERY MORNING. 15 mL 2  . Insulin Pen Needle (B-D ULTRAFINE III SHORT PEN) 31G X 8 MM MISC Use to inject insulin 4 times daily or as directed. 100 each 5  . loratadine (CLARITIN) 10 MG tablet Take 10 mg by mouth daily.    Marland Kitchen lovastatin (MEVACOR) 40 MG tablet Take 1 tablet (40 mg total) by mouth daily with supper. 90 tablet 3  . meclizine (ANTIVERT) 12.5 MG tablet Take 1 tablet (12.5 mg total) by mouth 3 (three) times daily as needed for dizziness. 30 tablet 0  . metFORMIN (GLUCOPHAGE-XR) 500 MG 24 hr tablet TAKE 1 TABLET  TWICE A DAY BEFORE MEALS 180 tablet 3  . metoCLOPramide (REGLAN) 5 MG tablet TAKE 1 TABLET 30 MIN BEFORE MEALS AND 1 TABLET AT BEDTIME. FOUR TIMES/DAY  4  . Multiple Vitamin (MULTIVITAMIN WITH MINERALS) TABS tablet Take 1 tablet by mouth daily.    . ondansetron (ZOFRAN) 4 MG tablet Take 1 tablet (4 mg total) by mouth every 8 (eight) hours as needed for nausea or vomiting. 20 tablet 0  . pantoprazole (PROTONIX) 40 MG tablet Take 1 tablet (40 mg total) by mouth 2 (two) times daily. 60 tablet 3  . Probiotic Product (PROBIOTIC PEARLS) CAPS Take 1 capsule by mouth daily with lunch.     . sodium chloride (OCEAN) 0.65 % SOLN nasal spray Place 1 spray into both nostrils daily.    Marland Kitchen spironolactone (ALDACTONE) 25 MG tablet TAKE 1 TABLET BY MOUTH EVERY DAY 30 tablet 2  . torsemide (DEMADEX) 10 MG tablet Take 1 tablet (10 mg total) by mouth daily as needed. 30 tablet 0  . traZODone (DESYREL) 50 MG tablet Take 1 tablet (50 mg total) by mouth at bedtime. 30 tablet 0  . valsartan (DIOVAN) 320 MG tablet Take 1 tablet (320 mg total) by mouth daily. 30 tablet 11  . vitamin E 400 UNIT capsule Take 400 Units by mouth daily.      Objective: BP (!) 139/50   Pulse (!) 59   Temp 97.7 F (36.5 C) (Oral)   Resp 20   Ht 5' (1.524 m)   Wt 87.1 kg   LMP  (LMP Unknown)   SpO2 93%   BMI 37.50 kg/m  Physical Exam Constitutional:      General: She is not in acute distress.    Appearance: Normal appearance. She is not ill-appearing or diaphoretic.  HENT:     Head: Normocephalic and atraumatic.  Cardiovascular:     Rate and Rhythm: Normal rate and regular rhythm.     Pulses: Normal pulses.     Heart sounds: Normal heart sounds.  Pulmonary:     Effort: Pulmonary effort is normal.  Breath sounds: Normal breath sounds.  Abdominal:     General: Abdomen is flat. Bowel sounds are normal. There is no distension.     Palpations: Abdomen is soft. There is no mass.     Tenderness: There is no abdominal tenderness.  There is no guarding or rebound.     Hernia: No hernia is present.  Musculoskeletal: Normal range of motion.        General: No swelling, tenderness or signs of injury.  Skin:    General: Skin is warm and dry.  Neurological:     Mental Status: She is alert and oriented to person, place, and time.  Psychiatric:        Mood and Affect: Mood is anxious.        Behavior: Behavior normal.        Thought Content: Thought content normal.      Labs and Imaging: CBC BMET  Recent Labs  Lab 03/26/19 0748  WBC 8.4  HGB 12.7  HCT 36.0  PLT 240   Recent Labs  Lab 03/26/19 0748  NA 123*  K 3.7  CL 88*  CO2 19*  BUN 26*  CREATININE 1.03*  GLUCOSE 264*  CALCIUM 9.3     No results found.   Kathrene Alu, MD 03/26/2019, 9:31 AM PGY-2, [facility] pager: [phone], text pages welcome

## 2019-03-26 NOTE — ED Provider Notes (Signed)
[facility] EMERGENCY DEPARTMENT Provider Note   CSN: [id number] Arrival date & time: 03/26/19  2202    History   Chief Complaint Chief Complaint  Patient presents with  . Nausea    "Just feeling sick"  . Hyperglycemia    HPI Rebecca Walter is a 78 y.o. female with past medical history of CHF, insulin-dependent type 2 diabetes, hypertension, hyperlipidemia, anxiety, presenting to the ED with complaint of nausea that began around 1am this morning. Nausea is constant and not improved with zofran (last dose at 3:15am).  She has not vomited.  On EMS arrival, CBG was 364. Last dose of insulin was 7u at bedtime. Denies associated cp, abd pain, diarrhea, constipation, urinary sx. Symptoms may feel similar to gastroparesis.  She states however she thinks it is mostly anxiety related.  PCP recently added mirtazipine QHS and instructed her to decrease ativan to every other night.    The history is provided by the patient and medical records.    Past Medical History:  Diagnosis Date  . Anxiety   . Arthritis   . Chronic diastolic CHF (congestive heart failure) ([place])   . Diabetes mellitus   . GERD (gastroesophageal reflux disease)   . Gout   . Hyperlipidemia   . Hypertension   . Hypertensive heart disease with CHF (congestive heart failure) ([place])   . Mild aortic stenosis 06/2017  . Mild mitral regurgitation 06/2017  . Moderate pulmonary valve insufficiency 06/2017  . Osteopenia     Patient Active Problem List   Diagnosis Date Noted  . Diabetic autonomic neuropathy ([place]) 01/24/2019  . Generalized weakness 12/15/2018  . Chronically dry eyes, right 12/15/2018  . Hypertension 10/17/2018  . Elevated TSH 11/21/2017  . Hypertensive heart disease with CHF (congestive heart failure) ([place])   . Chronic diastolic CHF (congestive heart failure) ([place]) 07/15/2017  . Mild aortic stenosis 06/19/2017  . Mild mitral regurgitation 06/19/2017  . Moderate pulmonary valve  insufficiency 06/19/2017  . Lumbar back pain with radiculopathy affecting right lower extremity 10/29/2016  . Vertigo 08/29/2013  . Allergic rhinitis 03/22/2011  . INSOMNIA 09/16/2009  . GASTROPARESIS 08/12/2009  . Anxiety 10/18/2008  . DYSKINESIA, ESOPHAGUS 09/13/2007  . CARCINOMA, BASAL CELL 05/26/2007  . Diabetes mellitus, type II ([place]) 02/16/2007  . HLD (hyperlipidemia) 02/16/2007  . OBESITY, NOS 02/16/2007  . GASTROESOPHAGEAL REFLUX, NO ESOPHAGITIS 02/16/2007  . OSTEOARTHRITIS, MULTI SITES 02/16/2007  . Osteopenia after menopause 02/16/2007  . Gout 02/16/2007    Past Surgical History:  Procedure Laterality Date  . KIDNEY SURGERY    . KNEE SURGERY    . MENISECTOMY    . WRIST SURGERY       OB History   No obstetric history on file.      Home Medications    Prior to Admission medications   Medication Sig Start Date End Date Taking? Authorizing Provider  acetaminophen (TYLENOL) 325 MG tablet Take 325 mg by mouth every 8 (eight) hours as needed (pain).     [provider]  allopurinol (ZYLOPRIM) 100 MG tablet Take 2 tablets (200 mg total) by mouth daily. 12/05/18   Kathrene Alu, MD  alum & mag hydroxide-simeth (MAALOX/MYLANTA) 200-200-20 MG/5ML suspension Take 30 mLs by mouth every 6 (six) hours as needed for indigestion or heartburn. Patient not taking: Reported on 03/08/2019 07/20/17   Nicolette Bang, DO  amLODipine (NORVASC) 10 MG tablet Take 10 mg by mouth daily. 03/04/19   [provider]  amLODipine ([place])  2.5 MG tablet TAKE 1 TABLET BY MOUTH EVERY DAY Patient not taking: Reported on 03/08/2019 03/05/19   Fay Records, MD  aspirin 81 MG chewable tablet Chew 1 tablet (81 mg total) by mouth daily. 03/22/11   Cletus Gash, MD  azelastine (ASTELIN) 0.1 % nasal spray Place 1 spray into both nostrils 2 (two) times daily. Use in each nostril as directed Patient not taking: Reported on 03/08/2019 09/18/18   Zenia Resides, MD  baclofen  (LIORESAL) 10 MG tablet Take 10 mg by mouth 3 (three) times daily. 01/28/19   [provider]  Blood Glucose Monitoring Suppl (ACCU-CHEK AVIVA PLUS) w/Device KIT 1 Device by Does not apply route daily. 11/02/18   Kathrene Alu, MD  busPIRone (BUSPAR) 10 MG tablet TAKE 1 TABLET BY MOUTH TWICE A DAY 03/05/19   [name], Alcario Drought, MD  Camphor-Eucalyptus-Menthol (VICKS VAPORUB EX) Place 1 application into both nostrils daily as needed (congestion).    [provider]  carvedilol (COREG) 3.125 MG tablet TAKE 1 TABLET (3.125 MG TOTAL) BY MOUTH 2 (TWO) TIMES DAILY WITH A MEAL. 11/30/18   Kathrene Alu, MD  famotidine (PEPCID) 20 MG tablet Take 20 mg by mouth daily.    [provider]  fluticasone (FLONASE) 50 MCG/ACT nasal spray Place 2 sprays into both nostrils daily. Patient not taking: Reported on 03/08/2019 04/21/18   Nicolette Bang, DO  glucose blood (ACCU-CHEK AVIVA PLUS) test strip CHECK BLOOD SUGAR FIRST  THING IN THE MORNING BEFORE EATING, AFTER LUNCH, AND  AFTER DINNER TOTAL 3 TIMES  DAILY 03/20/19   [name], Alcario Drought, MD  indapamide (LOZOL) 1.25 MG tablet Take 1 tablet (1.25 mg total) by mouth daily. 03/08/19   Zenia Resides, MD  insulin aspart (NOVOLOG FLEXPEN) 100 UNIT/ML FlexPen Inject 9-18 Units into the skin See admin instructions. Inject 15 units subcutaneously daily with breakfast, inject 9 units with lunch as needed for CBG 180 or more, and inject 9 units with supper 12/14/18   Alveda Reasons, MD  Insulin Glargine (LANTUS SOLOSTAR) 100 UNIT/ML Solostar Pen INJECT 70 UNITS INTO THE SKIN EVERY MORNING. 03/16/19   [name], Alcario Drought, MD  Insulin Pen Needle (B-D ULTRAFINE III SHORT PEN) 31G X 8 MM MISC Use to inject insulin 4 times daily or as directed. 01/01/19   Kathrene Alu, MD  loratadine (CLARITIN) 10 MG tablet Take 10 mg by mouth daily.    [provider]  lovastatin (MEVACOR) 40 MG tablet Take 1 tablet (40 mg total) by mouth daily  with supper. 06/30/18   Kathrene Alu, MD  meclizine (ANTIVERT) 12.5 MG tablet Take 1 tablet (12.5 mg total) by mouth 3 (three) times daily as needed for dizziness. 03/16/19   Meccariello, Bernita Raisin, DO  metFORMIN (GLUCOPHAGE-XR) 500 MG 24 hr tablet TAKE 1 TABLET TWICE A DAY BEFORE MEALS 08/22/18   [name], Alcario Drought, MD  metoCLOPramide (REGLAN) 5 MG tablet TAKE 1 TABLET 30 MIN BEFORE MEALS AND 1 TABLET AT BEDTIME. FOUR TIMES/DAY 09/04/17   [provider]  Multiple Vitamin (MULTIVITAMIN WITH MINERALS) TABS tablet Take 1 tablet by mouth daily.    [provider]  ondansetron (ZOFRAN) 4 MG tablet Take 1 tablet (4 mg total) by mouth every 8 (eight) hours as needed for nausea or vomiting. 03/14/19   [name], Alcario Drought, MD  pantoprazole (PROTONIX) 40 MG tablet Take 1 tablet (40 mg total) by mouth 2 (two) times daily. 03/05/19   Maia Breslow  C, MD  Probiotic Product (PROBIOTIC PEARLS) CAPS Take 1 capsule by mouth daily with lunch.     [provider]  sodium chloride (OCEAN) 0.65 % SOLN nasal spray Place 1 spray into both nostrils daily.    [provider]  spironolactone (ALDACTONE) 25 MG tablet TAKE 1 TABLET BY MOUTH EVERY DAY 12/28/18   Kathrene Alu, MD  torsemide (DEMADEX) 10 MG tablet Take 1 tablet (10 mg total) by mouth daily as needed. 03/22/19   Kathrene Alu, MD  traZODone (DESYREL) 50 MG tablet Take 1 tablet (50 mg total) by mouth at bedtime. 03/23/19   Leeanne Rio, MD  valsartan (DIOVAN) 320 MG tablet Take 1 tablet (320 mg total) by mouth daily. 12/15/18   Kathrene Alu, MD  vitamin E 400 UNIT capsule Take 400 Units by mouth daily.    [provider]    Family History Family History  Problem Relation Age of Onset  . Stroke Mother   . Heart disease Father   . Stroke Father   . Hypertension Father   . Cancer Sister   . Cancer Brother   . Heart disease Brother   . Diabetes Maternal Uncle     Social History Social History    Tobacco Use  . Smoking status: Never Smoker  . Smokeless tobacco: Never Used  Substance Use Topics  . Alcohol use: No  . Drug use: No     Allergies   Baclofen; Hctz [hydrochlorothiazide]; Lotensin [benazepril]; Victoza [liraglutide]; Acyclovir and related; Beta adrenergic blockers; and Losartan   Review of Systems Review of Systems  Constitutional: Negative for fever.  HENT: Negative for congestion.   Respiratory: Negative for cough.   Cardiovascular: Negative for chest pain and palpitations.  Gastrointestinal: Positive for nausea. Negative for abdominal pain, constipation, diarrhea and vomiting.  Endocrine: Negative for polyuria.  Genitourinary: Negative for dysuria and frequency.  All other systems reviewed and are negative.    Physical Exam Updated Vital Signs BP (!) 139/50   Pulse (!) 59   Temp 97.7 F (36.5 C) (Oral)   Resp 20   Ht 5' (1.524 m)   Wt 87.1 kg   LMP  (LMP Unknown)   SpO2 93%   BMI 37.50 kg/m   Physical Exam Vitals signs and nursing note reviewed.  Constitutional:      General: She is not in acute distress.    Appearance: She is well-developed. She is diaphoretic.  HENT:     Head: Normocephalic and atraumatic.     Mouth/Throat:     Mouth: Mucous membranes are moist.  Eyes:     Conjunctiva/sclera: Conjunctivae normal.  Cardiovascular:     Rate and Rhythm: Normal rate and regular rhythm.     Heart sounds: Murmur present.  Pulmonary:     Effort: Pulmonary effort is normal. No respiratory distress.     Breath sounds: Normal breath sounds.  Abdominal:     General: Bowel sounds are normal.     Palpations: Abdomen is soft.     Tenderness: There is no abdominal tenderness. There is no guarding or rebound.  Musculoskeletal:     Comments: Trace pretibial edema bilaterally  Skin:    General: Skin is warm.  Neurological:     Mental Status: She is alert.  Psychiatric:        Behavior: Behavior normal.      ED Treatments / Results   Labs (all labs ordered are listed, but only abnormal results are  displayed) Labs Reviewed  COMPREHENSIVE METABOLIC PANEL - Abnormal; Notable for the following components:      Result Value   Sodium 123 (*)    Chloride 88 (*)    CO2 19 (*)    Glucose, Bld 264 (*)    BUN 26 (*)    Creatinine, Ser 1.03 (*)    GFR calc non Af Amer 52 (*)    GFR calc Af Amer 60 (*)    Anion gap 16 (*)    All other components within normal limits  CBG MONITORING, ED - Abnormal; Notable for the following components:   Glucose-Capillary 283 (*)    All other components within normal limits  CBC WITH DIFFERENTIAL/PLATELET  TROPONIN I  LIPASE, BLOOD  URINALYSIS, ROUTINE W REFLEX MICROSCOPIC    EKG EKG Interpretation  Date/Time:  Monday March 26 2019 07:25:57 EDT Ventricular Rate:  62 PR Interval:    QRS Duration: 91 QT Interval:  436 QTC Calculation: 443 R Axis:   -12 Text Interpretation:  Sinus rhythm Prolonged PR interval Borderline repolarization abnormality No significant change since last tracing Confirmed by Duffy Bruce [phone]) on 03/26/2019 9:08:10 AM   Radiology No results found.  Procedures Procedures (including critical care time)  Medications Ordered in ED Medications  insulin aspart (novoLOG) injection 5 Units (has no administration in time range)  sodium chloride 0.9 % bolus 1,000 mL (1,000 mLs Intravenous New Bag/Given 03/26/19 0752)  ondansetron (ZOFRAN) injection 4 mg (4 mg Intravenous Given 03/26/19 0809)  LORazepam (ATIVAN) injection 0.5 mg (0.5 mg Intravenous Given 03/26/19 0809)     Initial Impression / Assessment and Plan / ED Course  I have reviewed the triage vital signs and the nursing notes.  Pertinent labs & imaging results that were available during my care of the patient were reviewed by me and considered in my medical decision making (see chart for details).  Clinical Course as of Mar 26 927  Mon Mar 26, 2019  0910 Dr. Grandville Silos with family medicine accepting  admission.   [JR]    Clinical Course User Index [JR] Amparo Donalson, [place] N, PA-C       Patient presenting with nausea without vomiting that began last night, and hyperglycemia per EMS.  On arrival, patient is diaphoretic, however not in distress.  Vital signs are stable.  Abdomen is soft and nontender.  Patient is not complaining of chest pain or respiratory symptoms.  Hyperglycemic at 283.  Labs and EKG obtained.  IV fluids initiated.  Antiemetics.  CBC is reassuring without leukocytosis.  Metabolic panel does show electrolyte abnormalities consistent with dehydration versus early DKA.  Corrected sodium is low at 126.  Bicarb is 19, anion gap is 16.  Troponin is within normal limits.  Patient will be admitted to the family medicine service for IV hydration and electrolyte correction.  Patient was discussed with and evaluated by Dr. Ellender Hose.  The patient appears reasonably stabilized for admission considering the current resources, flow, and capabilities available in the ED at this time, and I doubt any other [facility] requiring further screening and/or treatment in the ED prior to admission.   Final Clinical Impressions(s) / ED Diagnoses   Final diagnoses:  Dehydration  Nausea  Hyponatremia  Hyperglycemia    ED Discharge Orders    None       Erienne Spelman, [place] N, PA-C 03/26/19 9485    Duffy Bruce, MD 03/26/19 [phone]

## 2019-03-26 NOTE — Telephone Encounter (Signed)
Called patient for her scheduled virtual visit but no answer. On review of her chart, it seems she is currently in the ED. Will cancel virtual visit.  Leeanne Rio, MD

## 2019-03-26 NOTE — ED Notes (Signed)
ED Provider at bedside. 

## 2019-03-26 NOTE — Progress Notes (Signed)
New Admission Note:   Arrival Method: Stretcher from ED Mental Orientation: Alert and oriented x4  Telemetry: None  Assessment: Completed Skin: Intact IV: LH Normal Saline @100  ml Pain: 0 (o-10)  Tubes: None Safety Measures: Safety Fall Prevention Plan has been discussed  5 [place] Orientation: Patient has been orientated to the room, unit and staff.   Family: None   Orders to be reviewed and implemented. Will continue to monitor the patient. Call light has been placed within reach and bed alarm has been activated.   Baldo Ash, RN

## 2019-03-27 DIAGNOSIS — E871 Hypo-osmolality and hyponatremia: Secondary | ICD-10-CM | POA: Diagnosis not present

## 2019-03-27 LAB — BASIC METABOLIC PANEL
Anion gap: 8 (ref 5–15)
BUN: 25 mg/dL — ABNORMAL HIGH (ref 8–23)
CO2: 28 mmol/L (ref 22–32)
Calcium: 9.1 mg/dL (ref 8.9–10.3)
Chloride: 95 mmol/L — ABNORMAL LOW (ref 98–111)
Creatinine, Ser: 1.18 mg/dL — ABNORMAL HIGH (ref 0.44–1.00)
GFR calc Af Amer: 51 mL/min — ABNORMAL LOW (ref >60)
GFR calc non Af Amer: 44 mL/min — ABNORMAL LOW (ref >60)
Glucose, Bld: 117 mg/dL — ABNORMAL HIGH (ref 70–99)
Potassium: 4 mmol/L (ref 3.5–5.1)
Sodium: 131 mmol/L — ABNORMAL LOW (ref 135–145)

## 2019-03-27 LAB — GLUCOSE, CAPILLARY
Glucose-Capillary: 134 mg/dL — ABNORMAL HIGH (ref 70–99)
Glucose-Capillary: 171 mg/dL — ABNORMAL HIGH (ref 70–99)
Glucose-Capillary: 124 mg/dL — ABNORMAL HIGH (ref 70–99)
Glucose-Capillary: 166 mg/dL — ABNORMAL HIGH (ref 70–99)

## 2019-03-27 LAB — T3, FREE: T3, Free: 2.7 pg/mL (ref 2.0–4.4)

## 2019-03-27 MED ORDER — BUSPIRONE HCL 15 MG PO TABS: 15.0000 mg | ORAL_TABLET | Freq: Two times a day (BID) | ORAL | 2 refills | Status: DC

## 2019-03-27 MED ORDER — SERTRALINE HCL 25 MG PO TABS: 25.0000 mg | ORAL_TABLET | Freq: Every day | ORAL | 0 refills | Status: DC

## 2019-03-27 NOTE — TOC Transition Note (Signed)
Transition of Care [facility]) - CM/SW Discharge Note   Patient Details  Name: Rebecca Walter MRN: [id number] Date of Birth: 26-Jun-1941  [facility] CM/SW Contact:  Bartholomew Crews, RN Phone Number: [phone] 03/27/2019, 5:09 PM   Clinical Narrative:    Spoke with patient at bedside to discuss PT recommendations for short term rehab. Reviewed differences between the two and provided choice lists. Patient wants to discuss with her son, but she thinks that she may prefer to transition home with [facility] services. CM to follow up in the morning.      Barriers to Discharge: Continued Medical Work up   Patient Goals and CMS Choice        Discharge Placement                       Discharge Plan and Services In-house Referral: Clinical Social Work Discharge Planning Services: CM Consult                      Social Determinants of Health (SDOH) Interventions     Readmission Risk Interventions No flowsheet data found.

## 2019-03-27 NOTE — Evaluation (Signed)
Physical Therapy Evaluation Patient Details Name: Rebecca Walter MRN: [id number] DOB: [birth year] Today's Date: 03/27/2019   History of Present Illness  Pt is a 78 y/o female admitted secondary to worsening abdominal pain and hyponatremia. PMH includes DM, HTN, and anxiety.   Clinical Impression  Pt admitted secondary to problem above with deficits below. Pt anxious throughout session. Pt unsteady during gait with cane requiring min guard to min A. Pt reports she has had increased difficulty taking care of herself at home. Discussed SNF level therapy at d/c, and pt initially agreeable, however, at end of discussion reports she may want to transition back home. If pt decides to go home, will require increased assist at home to ensure safety with mobility tasks. Will continue to follow acutely to maximize functional mobility independence and safety.     Follow Up Recommendations SNF;Supervision for mobility/OOB    Equipment Recommendations  Rolling walker with 5" wheels    Recommendations for Other Services OT consult     Precautions / Restrictions Precautions Precautions: Fall Restrictions Weight Bearing Restrictions: No      Mobility  Bed Mobility Overal bed mobility: Needs Assistance Bed Mobility: Supine to Sit     Supine to sit: Supervision     General bed mobility comments: Supervision for safety.   Transfers Overall transfer level: Needs assistance Equipment used: Straight cane Transfers: Sit to/from Stand Sit to Stand: Min guard         General transfer comment: Min guard for steadying assist.   Ambulation/Gait Ambulation/Gait assistance: Min guard;Min assist Gait Distance (Feet): 20 Feet Assistive device: Straight cane Gait Pattern/deviations: Step-through pattern;Decreased stride length Gait velocity: Decreased   General Gait Details: Slow, hobble type gait. Unsteadiness noted throughout requiring min to min guard A. Pt anxious and wanting to stay in the  room for mobility tasks.   Stairs            Wheelchair Mobility    Modified Rankin (Stroke Patients Only)       Balance Overall balance assessment: Needs assistance Sitting-balance support: No upper extremity supported;Feet supported Sitting balance-Leahy Scale: Good     Standing balance support: Single extremity supported;During functional activity Standing balance-Leahy Scale: Poor Standing balance comment: Reliant on UE and external support                              Pertinent Vitals/Pain Pain Assessment: No/denies pain    Home Living Family/patient expects to be discharged to:: Private residence Living Arrangements: Alone Available Help at Discharge: Family;Available PRN/intermittently Type of Home: Apartment Home Access: Level entry     Home Layout: One level Home Equipment: Walker - 4 wheels;Cane - single point;Bedside commode;Shower seat      Prior Function Level of Independence: Independent with assistive device(s)         Comments: Pt reports she uses Rollator and cane depending on the day. Reports she usually takes a sink bath because she has fallen in her tub before.      Hand Dominance        Extremity/Trunk Assessment   Upper Extremity Assessment Upper Extremity Assessment: Defer to OT evaluation    Lower Extremity Assessment Lower Extremity Assessment: Generalized weakness    Cervical / Trunk Assessment Cervical / Trunk Assessment: Normal  Communication   Communication: No difficulties  Cognition Arousal/Alertness: Awake/alert Behavior During Therapy: Anxious Overall Cognitive Status: Within Functional Limits for tasks assessed  General Comments: Pt anxious throughout session, especially when discussing d/c disposition.       General Comments General comments (skin integrity, edema, etc.): Had lengthy conversation about SNF. Initially pt agreeable, stating she  doesn't feel safe being on her own. However, by end of discussion, pt reports "I can get around, I just don't know if I can do everything at home" and stated she may prefer to go home. Discussed current deficits and fall risk and need for assist at home if pt decides to go home.     Exercises     Assessment/Plan    PT Assessment Patient needs continued PT services  PT Problem List Decreased strength;Decreased balance;Decreased mobility;Decreased knowledge of use of DME;Decreased knowledge of precautions       PT Treatment Interventions DME instruction;Gait training;Functional mobility training;Therapeutic activities;Therapeutic exercise;Balance training;Patient/family education    PT Goals (Current goals can be found in the Care Plan section)  Acute Rehab PT Goals Patient Stated Goal: to be able to move better PT Goal Formulation: With patient Time For Goal Achievement: 04/10/19 Potential to Achieve Goals: Good    Frequency Min 3X/week   Barriers to discharge Decreased caregiver support      Co-evaluation               AM-PAC PT "6 Clicks" Mobility  Outcome Measure Help needed turning from your back to your side while in a flat bed without using bedrails?: A Little Help needed moving from lying on your back to sitting on the side of a flat bed without using bedrails?: A Little Help needed moving to and from a bed to a chair (including a wheelchair)?: A Little Help needed standing up from a chair using your arms (e.g., wheelchair or bedside chair)?: A Little Help needed to walk in hospital room?: A Little Help needed climbing 3-5 steps with a railing? : A Lot 6 Click Score: 17    End of Session Equipment Utilized During Treatment: Gait belt Activity Tolerance: Patient tolerated treatment well Patient left: in chair;with call bell/phone within reach;with chair alarm set Nurse Communication: Mobility status PT Visit Diagnosis: Unsteadiness on feet (R26.81);History of  falling (Z91.81);Muscle weakness (generalized) (M62.81)    Time: 2355-7322 PT Time Calculation (min) (ACUTE ONLY): 16 min   Charges:   PT Evaluation $PT Eval Low Complexity: [place], PT, DPT  Acute Rehabilitation Services  Pager: [phone] Office: [phone]   Rudean Hitt 03/27/2019, 5:21 PM

## 2019-03-27 NOTE — TOC Initial Note (Addendum)
Transition of Care [facility]) - Initial/Assessment Note    Patient Details  Name: Rebecca Walter MRN: [id number] Date of Birth: 04/08/1941  Transition of Care [facility]) CM/SW Contact:    Bartholomew Crews, RN Phone Number:  [phone] 03/27/2019, 11:23 AM  Clinical Narrative:                 Spoke with patient at bedside. Patient states that she lives alone in an apartment. Has an Engineer, production and Education officer, museum through [employer]. Patient asking about transitioning to independent living vs assisted living stating that she doesn't feel that she should be living alone - that she needs more help. Discussed that transitioning to assisted living is not something that can happen from the hospital. Discussed asking MD for PT/OT consult to assess current needs. Discussed talking to Education officer, museum at [employer] about housing options and applying for long term Medicaid.   Patient states that she does have a walker and a cane.   Verified PCP - states that someone gives her a ride whenever she has medical appointments or will pick up her medications.   States there son and/or daughter in law check in on her and pick up her groceries or prescriptions from pharmacy as needed.   CM to follow for transition of care needs.   Expected Discharge Plan: Skilled Nursing Facility Barriers to Discharge: Continued Medical Work up   Patient Goals and CMS Choice        Expected Discharge Plan and Services Expected Discharge Plan: [place] In-house Referral: Clinical Social Work Discharge Planning Services: CM Consult   Living arrangements for the past 2 months: Apartment                          Prior Living Arrangements/Services Living arrangements for the past 2 months: Apartment Lives with:: Self Patient language and need for interpreter reviewed:: Yes            Current home services: DME, Homehealth aide Criminal Activity/Legal Involvement Pertinent to  Current Situation/Hospitalization: No - Comment as needed  Activities of Daily Living Home Assistive Devices/Equipment: Other (Comment)(cane ) ADL Screening (condition at time of admission) Patient's cognitive ability adequate to safely complete daily activities?: Yes Is the patient deaf or have difficulty hearing?: No Does the patient have difficulty seeing, even when wearing glasses/contacts?: Yes Does the patient have difficulty concentrating, remembering, or making decisions?: No Patient able to express need for assistance with ADLs?: No Does the patient have difficulty dressing or bathing?: Yes Independently performs ADLs?: No Does the patient have difficulty walking or climbing stairs?: Yes Weakness of Legs: Both Weakness of Arms/Hands: Both  Permission Sought/Granted                  Emotional Assessment Appearance:: Appears younger than stated age Attitude/Demeanor/Rapport: Engaged Affect (typically observed): Accepting Orientation: : Oriented to Self, Oriented to Place, Oriented to Situation, Oriented to  Time   Psych Involvement: No (comment)  Admission diagnosis:  Dehydration [E86.0] Hyponatremia [E87.1] Nausea [R11.0] Hyperglycemia [R73.9] Patient Active Problem List   Diagnosis Date Noted  . Dehydration 03/26/2019  . Hyponatremia   . Diabetic autonomic neuropathy ([place]) 01/24/2019  . Generalized weakness 12/15/2018  . Chronically dry eyes, right 12/15/2018  . Hypertension 10/17/2018  . Elevated TSH 11/21/2017  . Hypertensive heart disease with CHF (congestive heart failure) ([place])   . Chronic diastolic CHF (congestive heart failure) ([place]) 07/15/2017  .  Mild aortic stenosis 06/19/2017  . Mild mitral regurgitation 06/19/2017  . Moderate pulmonary valve insufficiency 06/19/2017  . Lumbar back pain with radiculopathy affecting right lower extremity 10/29/2016  . Vertigo 08/29/2013  . Allergic rhinitis 03/22/2011  . INSOMNIA 09/16/2009  . GASTROPARESIS  08/12/2009  . Anxiety 10/18/2008  . DYSKINESIA, ESOPHAGUS 09/13/2007  . CARCINOMA, BASAL CELL 05/26/2007  . Diabetes mellitus, type II ([place]) 02/16/2007  . HLD (hyperlipidemia) 02/16/2007  . OBESITY, NOS 02/16/2007  . GASTROESOPHAGEAL REFLUX, NO ESOPHAGITIS 02/16/2007  . OSTEOARTHRITIS, MULTI SITES 02/16/2007  . Osteopenia after menopause 02/16/2007  . Gout 02/16/2007   PCP:  Kathrene Alu, MD Pharmacy:   [id number] [place], [place] [address] [place] [address] Phone: [phone] Fax: [phone]  [place], [place] [facility] [address] [place] [address] [place] [address] Phone: [phone] Fax: [phone]     Social Determinants of Health ([place]) Interventions    Readmission Risk Interventions No flowsheet data found.

## 2019-03-27 NOTE — Care Management Obs Status (Signed)
[place] NOTIFICATION   Patient Details  Name: Rebecca Walter MRN: [id number] Date of Birth: [birth year]   Medicare Observation Status Notification Given:  Yes    Bartholomew Crews, RN 03/27/2019, 11:13 AM

## 2019-03-27 NOTE — NC FL2 (Signed)
[place] LEVEL OF CARE SCREENING TOOL     IDENTIFICATION  Patient Name: Rebecca Walter Birthdate: 07/15/1941 Sex: female Admission Date (Current Location): 03/26/2019  [facility] and [place] Number:  Herbalist and Address:  The . [facility], [place] [address] [place], [place] [address]      Provider Number: [id number]  Attending Physician Name and Address:  Lind Covert, MD  Relative Name and Phone Number:  Joretta Bachelor  [phone]    Current Level of Care: Hospital Recommended Level of Care: Nursing Facility Prior Approval Number:    Date Approved/Denied:   PASRR Number:    Discharge Plan: SNF    Current Diagnoses: Patient Active Problem List   Diagnosis Date Noted  . Dehydration 03/26/2019  . Hyponatremia   . Diabetic autonomic neuropathy ([place]) 01/24/2019  . Generalized weakness 12/15/2018  . Chronically dry eyes, right 12/15/2018  . Hypertension 10/17/2018  . Elevated TSH 11/21/2017  . Hypertensive heart disease with CHF (congestive heart failure) ([place])   . Chronic diastolic CHF (congestive heart failure) ([place]) 07/15/2017  . Mild aortic stenosis 06/19/2017  . Mild mitral regurgitation 06/19/2017  . Moderate pulmonary valve insufficiency 06/19/2017  . Lumbar back pain with radiculopathy affecting right lower extremity 10/29/2016  . Vertigo 08/29/2013  . Allergic rhinitis 03/22/2011  . INSOMNIA 09/16/2009  . GASTROPARESIS 08/12/2009  . Anxiety 10/18/2008  . DYSKINESIA, ESOPHAGUS 09/13/2007  . CARCINOMA, BASAL CELL 05/26/2007  . Diabetes mellitus, type II ([place]) 02/16/2007  . HLD (hyperlipidemia) 02/16/2007  . OBESITY, NOS 02/16/2007  . GASTROESOPHAGEAL REFLUX, NO ESOPHAGITIS 02/16/2007  . OSTEOARTHRITIS, MULTI SITES 02/16/2007  . Osteopenia after menopause 02/16/2007  . Gout 02/16/2007    Orientation RESPIRATION BLADDER Height & Weight     Time, Self, Situation, Place  Normal Continent Weight: 87.8  kg Height:  5' (152.4 cm)  BEHAVIORAL SYMPTOMS/MOOD NEUROLOGICAL BOWEL NUTRITION STATUS      Continent Diet  AMBULATORY STATUS COMMUNICATION OF NEEDS Skin   Limited Assist Verbally Other (Comment)(MASD-sacrum)                       Personal Care Assistance Level of Assistance  Bathing, Dressing Bathing Assistance: Limited assistance   Dressing Assistance: Limited assistance     Functional Limitations Info  Sight, Hearing, Speech Sight Info: Adequate Hearing Info: Adequate Speech Info: Adequate    SPECIAL CARE FACTORS FREQUENCY  PT (By licensed PT), OT (By licensed OT)     PT Frequency: 03/27/2019 PT to eval and treat OT Frequency: 03/27/2019 OT pending            Contractures Contractures Info: Not present    Additional Factors Info  Allergies   Allergies Info: baclofen, hctz, lotensin, victoza, acyclovir, beta adrenergic blockers           Current Medications (03/27/2019):  This is the current hospital active medication list Current Facility-Administered Medications  Medication Dose Route Frequency Provider Last Rate Last Dose  . acetaminophen (TYLENOL) tablet 325 mg  325 mg Oral Q8H PRN Kathrene Alu, MD      . allopurinol (ZYLOPRIM) tablet 200 mg  200 mg Oral Daily Kathrene Alu, MD   200 mg at 03/27/19 0941  . amLODipine (NORVASC) tablet 10 mg  10 mg Oral Daily Kathrene Alu, MD   10 mg at 03/27/19 0941  . aspirin chewable tablet 81 mg  81 mg Oral Daily [name], Alcario Drought, MD   [phone]  mg at 03/27/19 0941  . busPIRone (BUSPAR) tablet 15 mg  15 mg Oral BID Bonnita Hollow, MD   15 mg at 03/27/19 0942  . carvedilol (COREG) tablet 3.125 mg  3.125 mg Oral BID WC Kathrene Alu, MD   3.125 mg at 03/27/19 0801  . enoxaparin (LOVENOX) injection 40 mg  40 mg Subcutaneous Q24H Maia Breslow C, MD   40 mg at 03/27/19 1150  . famotidine (PEPCID) tablet 20 mg  20 mg Oral Daily Kathrene Alu, MD   20 mg at 03/27/19 0942  . insulin aspart (novoLOG)  injection 0-5 Units  0-5 Units Subcutaneous QHS Kathrene Alu, MD   2 Units at 03/26/19 2222  . insulin aspart (novoLOG) injection 0-9 Units  0-9 Units Subcutaneous TID WC Kathrene Alu, MD   2 Units at 03/27/19 1150  . insulin glargine (LANTUS) injection 70 Units  70 Units Subcutaneous Daily Kathrene Alu, MD   70 Units at 03/27/19 0941  . irbesartan (AVAPRO) tablet 300 mg  300 mg Oral Daily Kathrene Alu, MD   300 mg at 03/27/19 0942  . loratadine (CLARITIN) tablet 10 mg  10 mg Oral Daily Kathrene Alu, MD   10 mg at 03/27/19 0941  . ondansetron (ZOFRAN) tablet 4 mg  4 mg Oral Q8H PRN Kathrene Alu, MD   4 mg at 03/27/19 0648  . pantoprazole (PROTONIX) EC tablet 40 mg  40 mg Oral BID Kathrene Alu, MD   40 mg at 03/27/19 0942  . pravastatin (PRAVACHOL) tablet 40 mg  40 mg Oral q1800 Kathrene Alu, MD   40 mg at 03/26/19 1705  . sertraline (ZOLOFT) tablet 25 mg  25 mg Oral Daily Kathrene Alu, MD   25 mg at 03/27/19 0942  . spironolactone (ALDACTONE) tablet 25 mg  25 mg Oral Daily Kathrene Alu, MD   25 mg at 03/27/19 0942  . torsemide (DEMADEX) tablet 10 mg  10 mg Oral Daily Kathrene Alu, MD   10 mg at 03/27/19 0942  . traZODone (DESYREL) tablet 50 mg  50 mg Oral QHS Bonnita Hollow, MD   50 mg at 03/26/19 2224     Discharge Medications: Please see discharge summary for a list of discharge medications.  Relevant Imaging Results:  Relevant Lab Results:   Additional Information SSN [id number]  Bartholomew Crews, RN

## 2019-03-27 NOTE — Progress Notes (Signed)
Family Medicine Teaching Service Daily Progress Note Intern Pager: [phone]  Patient name: Rebecca Walter record number: [id number] Date of birth: [birth year] Age: 78 y.o. Gender: female  Primary Care Provider: Kathrene Alu, MD Consultants: none Code Status: Full Code   Pt Overview and Major Events to Date:  Admitted: 03/26/2019 for CC: Nausea ("Just feeling sick") and Hyperglycemia  Hospital Day: 2 Assessment and Plan: Rebecca Walter is a 78 y.o. female admitted for poor PO intake and nausea found to have hyponatremia. Her chronic conditions include IDDM, HTN, HFpEF, Anxiety, GERD  # Hyponatremia  Na 131with glucose 117 on BMP this morning. 123 on admission. Most recent normal Na in Feb 2020 141. Patient is net negative -710.  Did not get urine on admission, unsure of ketonuria or glucosuria.   Strict IO   Continue Fluid restriction    # IDDM A1C 7.0, up from 6.5 in January. Glucose randing from 131-203 in the last 24 hours. 11 units aspart in last 24 hours.   Continue lantus 70u daily   sSSI QHS and TID WC  # Elevated TSH 0 Subclinical Hypothyroidism  TSH 5.368, T3 2.7 and T4 1.0. TSH in December 2019 10.250  Follow up as outpatient  #AKI  Creatine is 1.18, up from 1.05 on admission. Baseline Cr ~.8-.9. Possibly due to decrease H2O intake.   Will continue to monitor Cr.   # Nausea in setting of Chronic abdominal pain  Improved this morning with dose of zofran. Overall feeling better.   Zofran PRN    # HTN & HFpEF  Last echo 06/2017 60-65%  . BP ranging from 115-159/47-79. Last BP 158/58    Continue home 10mg  amlodipine, carvedilol 3.125 mg BID, sprinolactone 25mg  daily, torsemide 10mg  daily, ASA 81 mg   Consider holding Irbesartan 300mg  (on home valsartan 320mg  daily)  # Anxiety  Continue new sertraline 25mg  daily, trazodone 50mg  QHS   # GERD  Continue home PTX 40mg  BID  # Seasonal Allergies   Claritin 10mg  daily    # Hypertriglyceridemia   323 in October 2019  Continue home pravastatin 40mg  daily   #Gout  Continue home allopurinol daily   #FEN/GI:  . Fluids: none  . Nutrition: Carb Modified - Fluid Restriction 12104mL daily   Access: Left PIV  VTE prophylaxis: Lovenox Q24H  Disposition: Home pending improvement of electrolyte abnormalities.     Subjective:  NAEO.   Objective: BP (!) 158/58 (BP Location: Right Arm)   Pulse (!) 59   Temp 98.2 F (36.8 C) (Oral)   Resp 20   Ht 5' (1.524 m)   Wt 87.8 kg   LMP  (LMP Unknown)   SpO2 95%   BMI 37.80 kg/m  Intake/Output      04/06 0701 - 04/07 0700 04/07 0701 - 04/08 0700   P.O. 1240    I.V. (mL/kg) 200 (2.3)    Total Intake(mL/kg) 1440 (16.4)    Urine (mL/kg/hr) 2150    Total Output 2150    Net -710          Physical Exam: General: NAD, non-toxic, well-appearing, sitting comfortably in bed.  Cardiovascular: RRR, normal S1, S2. 2+ RP bilaterally Respiratory: CTAB. No IWOB.  Abdomen: + BS. NT, ND, soft to palpation.  Extremities: Warm and well perfused. Moving spontaneously.   Laboratory: I have personally read and reviewed all labs and imaging studies.  CBC: Recent Labs  Lab 03/26/19 0748  WBC 8.4  NEUTROABS 5.1  HGB 12.7  HCT 36.0  MCV 88.5  PLT 017   Basic Metabolic Panel: Recent Labs  Lab 03/26/19 0748 03/26/19 1623 03/27/19 0455  NA 123* 130* 131*  K 3.7 3.9 4.0  CL 88* 95* 95*  CO2 19* 25 28  GLUCOSE 264* 166* 117*  BUN 26* 20 25*  CREATININE 1.03* 1.05* 1.18*  CALCIUM 9.3 9.1 9.1   GFR: Estimated Creatinine Clearance: 38.1 mL/min (A) (by C-G formula based on SCr of 1.18 mg/dL (H)). Liver Function Tests: Recent Labs  Lab 03/26/19 0748  AST 19  ALT 16  ALKPHOS 43  BILITOT 0.4  PROT 7.0  ALBUMIN 3.9   Recent Labs  Lab 03/26/19 0748  LIPASE 39   No results for input(s): AMMONIA in the last 168 hours. Coagulation Profile: No results for input(s): INR, PROTIME in the last 168 hours. Cardiac Enzymes: Recent  Labs  Lab 03/26/19 0748  TROPONINI <0.03   BNP (last 3 results) No results for input(s): PROBNP in the last 8760 hours. HbA1C: Recent Labs    03/26/19 1117  HGBA1C 7.0*   CBG: Recent Labs  Lab 03/26/19 0717 03/26/19 1102 03/26/19 1633 03/26/19 2039 03/27/19 0624  GLUCAP 283* 131* 165* 203* 134*   Lipid Profile: No results for input(s): CHOL, HDL, LDLCALC, TRIG, CHOLHDL, LDLDIRECT in the last 72 hours. Thyroid Function Tests: Recent Labs    03/26/19 1117  TSH 5.368*  FREET4 1.00  T3FREE 2.7   Anemia Panel: No results for input(s): VITAMINB12, FOLATE, FERRITIN, TIBC, IRON, RETICCTPCT in the last 72 hours. Urine analysis:    Component Value Date/Time   COLORURINE STRAW (A) 07/14/2017 2247   APPEARANCEUR CLEAR 07/14/2017 2247   LABSPEC 1.006 07/14/2017 2247   PHURINE 7.0 07/14/2017 2247   GLUCOSEU NEGATIVE 07/14/2017 2247   HGBUR NEGATIVE 07/14/2017 2247   HGBUR negative 07/28/2010 1028   BILIRUBINUR NEGATIVE 07/14/2017 2247   BILIRUBINUR NEG 01/14/2017 1403   KETONESUR NEGATIVE 07/14/2017 2247   PROTEINUR 100 (A) 07/14/2017 2247   UROBILINOGEN 0.2 01/14/2017 1403   UROBILINOGEN 0.2 08/06/2012 1913   NITRITE NEGATIVE 07/14/2017 2247   LEUKOCYTESUR TRACE (A) 07/14/2017 2247   Sepsis Labs: No results for input(s): PROCALCITON in the last 168 hours.  Invalid input(s): LACTICIDVEN No results for input(s): PHVEN, PCO2VEN, HCO3 in the last 168 hours. No results for input(s): TROPIPOC in the last 168 hours.  No results found for this or any previous visit (from the past 240 hour(s)).   Imaging/Diagnostic Tests: No results found.  Wilber Oliphant, MD 03/27/2019, 8:51 AM PGY-1, [place] Intern pager: [phone], text pages welcome

## 2019-03-27 NOTE — Discharge Summary (Signed)
[facility] Discharge Summary  Patient name: Rebecca Walter Medical record number: [id number] Date of birth: [birth year] Age: 78 y.o. 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 AKI Diabetes Subclinical hypothyroidism AKI HTN  HFpEF Anxiety GERD Seasonal allergies Hyperlipidemia  Gout  Disposition: Discharge to SNF  Discharge Condition: Stable  Discharge Exam:  BP (!) 137/57 (BP Location: Right Arm)   Pulse 66   Temp 97.8 F (36.6 C) (Oral)   Resp 18   Ht 5' (1.524 m)   Wt 87.8 kg   LMP  (LMP Unknown)   SpO2 95%   BMI 37.80 kg/m    General: sitting up on side of bed in NAD, pleasant Cardiovascular: RRR, no murmurs Respiratory: CTAB, NWOB on RA Abdomen: soft, nondistended, + bowel sounds Extremities: WWP, no LE edema   Brief Hospital Course:  Rebecca Walter is a 78 y.o. female with past medical history significant for IDDM, HTN, HFpEF, anxiety, GERD, who presented to nausea and found to be hyponatremic to 126 corrected for glucose. Likely primary polydipsia in the setting of significant water intake at home due to dry mouth.  Patient responded well to 1 L bolus and 100 mL/h for 8 hours normal saline and free water restriction.  During the admission, patient's hyponatremia continued to improve to 133 and Phylicia was improved and stable by the day of discharge.  During her hospital stay she had a few other issues. TSH was slightly elevated at 5.368, however T3 and T4 were within normal limits and consistent with subclinical hypothyroidism.  Additionally patient was noted to be extremely anxious on admission and has history of anxiety that is treated by her PCP.  She was  switched to Zoloft 25 mg each morning and started trazodone 50 mg nightly with improvement.   Also BuSpar was increased to 50 mg twice daily.    Additionally, patient complained of abdominal pain in the setting of likely functional dyspepsia.  Patient requested CT abdomen pelvis during her admission, however, as this was not an urgent matter, patient is to follow with [facility] in May who will continue her abdominal pain work-up.   Issues for Follow Up:  1. Recheck BMP in 1 week for Na 2. Symptom improvement of Anxiety on new medication  3. Recheck TSH and T3 and T4 in 1 month for subclinical hypothyroidism 4. Needs [facility] follow up as planned for functional dyspepsia   Significant Procedures:  None   Significant Labs and Imaging:  Recent Labs  Lab 03/26/19 0748  WBC 8.4  HGB 12.7  HCT 36.0  PLT 240   Recent Labs  Lab 03/26/19 0748 03/26/19 1623 03/27/19 0455 03/28/19 0353  NA 123* 130* 131* 133*  K 3.7 3.9 4.0 4.0  CL 88* 95* 95* 97*  CO2 19* '25 28 26  ' GLUCOSE 264* 166* 117* 110*  BUN 26* 20 25* 25*  CREATININE 1.03* 1.05* 1.18* 1.14*  CALCIUM 9.3 9.1 9.1 9.6  ALKPHOS 43  --   --   --   AST 19  --   --   --   ALT 16  --   --   --   ALBUMIN 3.9  --   --   --    Results/Tests  Pending at Time of Discharge:  . none  Discharge Medications:  Allergies as of 03/28/2019      Reactions   Baclofen Other (See Comments)   Caused leg weakness   Hctz [hydrochlorothiazide] Other (See Comments)   Uric acid elevation and gout.      Lotensin [benazepril] Other (See Comments)   Dry cough   Victoza [liraglutide] Other (See Comments)   Tongue Glossitus   Acyclovir And Related Diarrhea   Beta Adrenergic Blockers Other (See Comments)   Occurred with metoprolol  REACTION: coughing   Losartan Diarrhea      Medication List    STOP taking these medications   azelastine 0.1 % nasal spray Commonly known as:  ASTELIN   indapamide 1.25 MG tablet Commonly known as:  LOZOL    meclizine 12.5 MG tablet Commonly known as:  ANTIVERT   vitamin E 400 UNIT capsule     TAKE these medications   Accu-Chek Aviva Plus w/Device Kit 1 Device by Does not apply route daily.   acetaminophen 325 MG tablet Commonly known as:  TYLENOL Take 325 mg by mouth every 8 (eight) hours as needed (pain).   allopurinol 100 MG tablet Commonly known as:  ZYLOPRIM Take 2 tablets (200 mg total) by mouth daily.   alum & mag hydroxide-simeth 200-200-20 MG/5ML suspension Commonly known as:  MAALOX/MYLANTA Take 30 mLs by mouth every 6 (six) hours as needed for indigestion or heartburn.   amLODipine 10 MG tablet Commonly known as:  NORVASC Take 10 mg by mouth daily. What changed:  Another medication with the same name was removed. Continue taking this medication, and follow the directions you see here.   aspirin 81 MG chewable tablet Chew 1 tablet (81 mg total) by mouth daily.   baclofen 10 MG tablet Commonly known as:  LIORESAL Take 10 mg by mouth 2 (two) times daily.   busPIRone 15 MG tablet Commonly known as:  BUSPAR Take 1 tablet (15 mg total) by mouth 2 (two) times daily. What changed:    medication strength  how much to take   carvedilol 3.125 MG tablet Commonly known as:  COREG TAKE 1 TABLET (3.125 MG TOTAL) BY MOUTH 2 (TWO) TIMES DAILY WITH A MEAL.   famotidine 20 MG tablet Commonly known as:  PEPCID Take 20 mg by mouth daily.   fluticasone 50 MCG/ACT nasal spray Commonly known as:  FLONASE Place 2 sprays into both nostrils daily.   glucose blood test strip Commonly known as:  Accu-Chek Aviva Plus CHECK BLOOD SUGAR FIRST  THING IN THE MORNING BEFORE EATING, AFTER LUNCH, AND  AFTER DINNER TOTAL 3 TIMES  DAILY   insulin aspart 100 UNIT/ML FlexPen Commonly known as:  NovoLOG FlexPen Inject 9-18 Units into the skin See admin instructions. Inject 15 units subcutaneously daily with breakfast, inject 9 units with lunch as needed for CBG 180 or more, and inject 9  units with supper   Insulin Glargine 100 UNIT/ML Solostar Pen Commonly known as:  Lantus SoloStar INJECT 70 UNITS INTO THE SKIN EVERY MORNING. What changed:    how much to take  how to take this  when to take this  additional instructions   Insulin Pen Needle 31G X 8 MM Misc Commonly known as:  B-D ULTRAFINE III SHORT PEN Use to inject insulin 4 times daily or as directed.   loratadine 10 MG tablet Commonly known as:  CLARITIN Take 10 mg by mouth daily.   lovastatin 40 MG tablet Commonly  known as:  MEVACOR Take 1 tablet (40 mg total) by mouth daily with supper.   metFORMIN 500 MG 24 hr tablet Commonly known as:  GLUCOPHAGE-XR TAKE 1 TABLET TWICE A DAY BEFORE MEALS What changed:    how much to take  how to take this  when to take this  additional instructions   metoCLOPramide 5 MG tablet Commonly known as:  REGLAN Take 5 mg by mouth See admin instructions. Take 1 tablet 30 mins before meals and 1 tablet at bedtime 4 times daily   multivitamin with minerals Tabs tablet Take 1 tablet by mouth daily.   ondansetron 4 MG tablet Commonly known as:  Zofran Take 1 tablet (4 mg total) by mouth every 8 (eight) hours as needed for nausea or vomiting.   pantoprazole 40 MG tablet Commonly known as:  PROTONIX Take 1 tablet (40 mg total) by mouth 2 (two) times daily.   Probiotic Pearls Caps Take 1 capsule by mouth daily with lunch.   sertraline 25 MG tablet Commonly known as:  ZOLOFT Take 1 tablet (25 mg total) by mouth daily.   spironolactone 25 MG tablet Commonly known as:  ALDACTONE TAKE 1 TABLET BY MOUTH EVERY DAY   torsemide 10 MG tablet Commonly known as:  DEMADEX Take 1 tablet (10 mg total) by mouth daily as needed. What changed:  reasons to take this   traZODone 50 MG tablet Commonly known as:  DESYREL Take 1 tablet (50 mg total) by mouth at bedtime.   valsartan 320 MG tablet Commonly known as:  DIOVAN Take 1 tablet (320 mg total) by mouth daily.    VICKS VAPORUB EX Place 1 application into both nostrils daily as needed (congestion).       Discharge Instructions: Please refer to Patient Instructions section of EMR for full details.  Patient was counseled important signs and symptoms that should prompt return to medical care, changes in medications, dietary instructions, activity restrictions, and follow up appointments.   Follow-Up Appointments:  Contact information for follow-up providers    [name], Alcario Drought, MD. Call.   Specialty:  Family Medicine Contact information: [address] [place] [address] [phone]            Contact information for after-discharge care    [facility] .   Service:  Skilled Nursing Contact information: [place], [place], DO 03/28/2019, 1:50 PM PGY-3, [place]

## 2019-03-28 DIAGNOSIS — Z7401 Bed confinement status: Secondary | ICD-10-CM | POA: Diagnosis not present

## 2019-03-28 DIAGNOSIS — Z9181 History of falling: Secondary | ICD-10-CM | POA: Diagnosis not present

## 2019-03-28 DIAGNOSIS — J302 Other seasonal allergic rhinitis: Secondary | ICD-10-CM | POA: Diagnosis not present

## 2019-03-28 DIAGNOSIS — Z833 Family history of diabetes mellitus: Secondary | ICD-10-CM | POA: Diagnosis not present

## 2019-03-28 DIAGNOSIS — Z7982 Long term (current) use of aspirin: Secondary | ICD-10-CM | POA: Diagnosis not present

## 2019-03-28 DIAGNOSIS — M8580 Other specified disorders of bone density and structure, unspecified site: Secondary | ICD-10-CM | POA: Diagnosis not present

## 2019-03-28 DIAGNOSIS — K219 Gastro-esophageal reflux disease without esophagitis: Secondary | ICD-10-CM | POA: Diagnosis not present

## 2019-03-28 DIAGNOSIS — N179 Acute kidney failure, unspecified: Secondary | ICD-10-CM | POA: Diagnosis not present

## 2019-03-28 DIAGNOSIS — E785 Hyperlipidemia, unspecified: Secondary | ICD-10-CM | POA: Diagnosis not present

## 2019-03-28 DIAGNOSIS — E1143 Type 2 diabetes mellitus with diabetic autonomic (poly)neuropathy: Secondary | ICD-10-CM | POA: Diagnosis not present

## 2019-03-28 DIAGNOSIS — I110 Hypertensive heart disease with heart failure: Secondary | ICD-10-CM | POA: Diagnosis not present

## 2019-03-28 DIAGNOSIS — Z89612 Acquired absence of left leg above knee: Secondary | ICD-10-CM | POA: Diagnosis not present

## 2019-03-28 DIAGNOSIS — M109 Gout, unspecified: Secondary | ICD-10-CM | POA: Diagnosis not present

## 2019-03-28 DIAGNOSIS — E1165 Type 2 diabetes mellitus with hyperglycemia: Secondary | ICD-10-CM | POA: Diagnosis not present

## 2019-03-28 DIAGNOSIS — M2550 Pain in unspecified joint: Secondary | ICD-10-CM | POA: Diagnosis not present

## 2019-03-28 DIAGNOSIS — Z8249 Family history of ischemic heart disease and other diseases of the circulatory system: Secondary | ICD-10-CM | POA: Diagnosis not present

## 2019-03-28 DIAGNOSIS — I69151 Hemiplegia and hemiparesis following nontraumatic intracerebral hemorrhage affecting right dominant side: Secondary | ICD-10-CM | POA: Diagnosis not present

## 2019-03-28 DIAGNOSIS — G8929 Other chronic pain: Secondary | ICD-10-CM | POA: Diagnosis not present

## 2019-03-28 DIAGNOSIS — Z794 Long term (current) use of insulin: Secondary | ICD-10-CM | POA: Diagnosis not present

## 2019-03-28 DIAGNOSIS — G4700 Insomnia, unspecified: Secondary | ICD-10-CM | POA: Diagnosis not present

## 2019-03-28 DIAGNOSIS — R110 Nausea: Secondary | ICD-10-CM | POA: Diagnosis not present

## 2019-03-28 DIAGNOSIS — I5032 Chronic diastolic (congestive) heart failure: Secondary | ICD-10-CM | POA: Diagnosis not present

## 2019-03-28 DIAGNOSIS — E039 Hypothyroidism, unspecified: Secondary | ICD-10-CM | POA: Diagnosis not present

## 2019-03-28 DIAGNOSIS — I10 Essential (primary) hypertension: Secondary | ICD-10-CM | POA: Diagnosis not present

## 2019-03-28 DIAGNOSIS — R1013 Epigastric pain: Secondary | ICD-10-CM | POA: Diagnosis not present

## 2019-03-28 DIAGNOSIS — M6281 Muscle weakness (generalized): Secondary | ICD-10-CM | POA: Diagnosis not present

## 2019-03-28 DIAGNOSIS — E860 Dehydration: Secondary | ICD-10-CM | POA: Diagnosis not present

## 2019-03-28 DIAGNOSIS — R2689 Other abnormalities of gait and mobility: Secondary | ICD-10-CM | POA: Diagnosis not present

## 2019-03-28 DIAGNOSIS — I5030 Unspecified diastolic (congestive) heart failure: Secondary | ICD-10-CM | POA: Diagnosis not present

## 2019-03-28 DIAGNOSIS — I959 Hypotension, unspecified: Secondary | ICD-10-CM | POA: Diagnosis not present

## 2019-03-28 DIAGNOSIS — R2681 Unsteadiness on feet: Secondary | ICD-10-CM | POA: Diagnosis not present

## 2019-03-28 DIAGNOSIS — E119 Type 2 diabetes mellitus without complications: Secondary | ICD-10-CM | POA: Diagnosis not present

## 2019-03-28 DIAGNOSIS — E871 Hypo-osmolality and hyponatremia: Secondary | ICD-10-CM | POA: Diagnosis not present

## 2019-03-28 DIAGNOSIS — Z79899 Other long term (current) drug therapy: Secondary | ICD-10-CM | POA: Diagnosis not present

## 2019-03-28 LAB — BASIC METABOLIC PANEL
Anion gap: 10 (ref 5–15)
BUN: 25 mg/dL — ABNORMAL HIGH (ref 8–23)
CO2: 26 mmol/L (ref 22–32)
Calcium: 9.6 mg/dL (ref 8.9–10.3)
Chloride: 97 mmol/L — ABNORMAL LOW (ref 98–111)
Creatinine, Ser: 1.14 mg/dL — ABNORMAL HIGH (ref 0.44–1.00)
GFR calc Af Amer: 53 mL/min — ABNORMAL LOW (ref >60)
GFR calc non Af Amer: 46 mL/min — ABNORMAL LOW (ref >60)
Glucose, Bld: 110 mg/dL — ABNORMAL HIGH (ref 70–99)
Potassium: 4 mmol/L (ref 3.5–5.1)
Sodium: 133 mmol/L — ABNORMAL LOW (ref 135–145)

## 2019-03-28 LAB — URINALYSIS, ROUTINE W REFLEX MICROSCOPIC
Bilirubin Urine: NEGATIVE
Glucose, UA: NEGATIVE mg/dL
Hgb urine dipstick: NEGATIVE
Ketones, ur: NEGATIVE mg/dL
Leukocytes,Ua: NEGATIVE
Nitrite: NEGATIVE
Protein, ur: NEGATIVE mg/dL
Specific Gravity, Urine: 1.003 — ABNORMAL LOW (ref 1.005–1.030)
pH: 5 (ref 5.0–8.0)

## 2019-03-28 LAB — GLUCOSE, CAPILLARY
Glucose-Capillary: 111 mg/dL — ABNORMAL HIGH (ref 70–99)
Glucose-Capillary: 149 mg/dL — ABNORMAL HIGH (ref 70–99)

## 2019-03-28 MED ORDER — ONDANSETRON HCL 4 MG PO TABS: 4.0000 mg | ORAL_TABLET | Freq: Three times a day (TID) | ORAL | 0 refills | Status: DC | PRN

## 2019-03-28 MED ORDER — TRAZODONE HCL 50 MG PO TABS: 50.0000 mg | ORAL_TABLET | Freq: Every day | ORAL | 0 refills | Status: DC

## 2019-03-28 MED ORDER — ONDANSETRON HCL 4 MG PO TABS
4.0000 mg | ORAL_TABLET | Freq: Once | ORAL | Status: AC
  Administered 2019-03-28: 4 mg via ORAL

## 2019-03-28 NOTE — TOC Progression Note (Signed)
Transition of Care [facility]) - Progression Note    Patient Details  Name: Rebecca Walter MRN: [id number] Date of Birth: 12/24/1940  Transition of Care [facility]) CM/SW Contact  Bartholomew Crews, RN Phone Number: [phone] 03/28/2019, 12:07 PM  Clinical Narrative:    PASRR# [id number] E received. Start date 03/28/2019 through 04/27/2019.   Expected Discharge Plan: Skilled Nursing Facility Barriers to Discharge: SNF Pending bed offer  Expected Discharge Plan and Services Expected Discharge Plan: [place] In-house Referral: Clinical Social Work Discharge Planning Services: CM Consult Post Acute Care Choice: [place] arrangements for the past 2 months: Apartment                 DME Arranged: N/A DME Agency: NA   HH Agency: NA   Social Determinants of Health (SDOH) Interventions    Readmission Risk Interventions No flowsheet data found.

## 2019-03-28 NOTE — Progress Notes (Signed)
Rebecca Walter to be D/C'd Skilled nursing facility per MD order.  Discussed prescriptions and follow up appointments with the patient. Prescriptions given to patient, medication list explained in detail. Pt verbalized understanding.  Allergies as of 03/28/2019      Reactions   Baclofen Other (See Comments)   Caused leg weakness   Hctz [hydrochlorothiazide] Other (See Comments)   Uric acid elevation and gout.      Lotensin [benazepril] Other (See Comments)   Dry cough   Victoza [liraglutide] Other (See Comments)   Tongue Glossitus   Acyclovir And Related Diarrhea   Beta Adrenergic Blockers Other (See Comments)   Occurred with metoprolol  REACTION: coughing   Losartan Diarrhea      Medication List    STOP taking these medications   azelastine 0.1 % nasal spray Commonly known as:  ASTELIN   indapamide 1.25 MG tablet Commonly known as:  LOZOL   meclizine 12.5 MG tablet Commonly known as:  ANTIVERT   vitamin E 400 UNIT capsule     TAKE these medications   Accu-Chek Aviva Plus w/Device Kit 1 Device by Does not apply route daily.   acetaminophen 325 MG tablet Commonly known as:  TYLENOL Take 325 mg by mouth every 8 (eight) hours as needed (pain).   allopurinol 100 MG tablet Commonly known as:  ZYLOPRIM Take 2 tablets (200 mg total) by mouth daily.   alum & mag hydroxide-simeth 200-200-20 MG/5ML suspension Commonly known as:  MAALOX/MYLANTA Take 30 mLs by mouth every 6 (six) hours as needed for indigestion or heartburn.   amLODipine 10 MG tablet Commonly known as:  NORVASC Take 10 mg by mouth daily. What changed:  Another medication with the same name was removed. Continue taking this medication, and follow the directions you see here.   aspirin 81 MG chewable tablet Chew 1 tablet (81 mg total) by mouth daily.   baclofen 10 MG tablet Commonly known as:  LIORESAL Take 10 mg by mouth 2 (two) times daily.   busPIRone 15 MG tablet Commonly known as:  BUSPAR Take 1  tablet (15 mg total) by mouth 2 (two) times daily. What changed:    medication strength  how much to take   carvedilol 3.125 MG tablet Commonly known as:  COREG TAKE 1 TABLET (3.125 MG TOTAL) BY MOUTH 2 (TWO) TIMES DAILY WITH A MEAL.   famotidine 20 MG tablet Commonly known as:  PEPCID Take 20 mg by mouth daily.   fluticasone 50 MCG/ACT nasal spray Commonly known as:  FLONASE Place 2 sprays into both nostrils daily.   glucose blood test strip Commonly known as:  Accu-Chek Aviva Plus CHECK BLOOD SUGAR FIRST  THING IN THE MORNING BEFORE EATING, AFTER LUNCH, AND  AFTER DINNER TOTAL 3 TIMES  DAILY   insulin aspart 100 UNIT/ML FlexPen Commonly known as:  NovoLOG FlexPen Inject 9-18 Units into the skin See admin instructions. Inject 15 units subcutaneously daily with breakfast, inject 9 units with lunch as needed for CBG 180 or more, and inject 9 units with supper   Insulin Glargine 100 UNIT/ML Solostar Pen Commonly known as:  Lantus SoloStar INJECT 70 UNITS INTO THE SKIN EVERY MORNING. What changed:    how much to take  how to take this  when to take this  additional instructions   Insulin Pen Needle 31G X 8 MM Misc Commonly known as:  B-D ULTRAFINE III SHORT PEN Use to inject insulin 4 times daily or as directed.  loratadine 10 MG tablet Commonly known as:  CLARITIN Take 10 mg by mouth daily.   lovastatin 40 MG tablet Commonly known as:  MEVACOR Take 1 tablet (40 mg total) by mouth daily with supper.   metFORMIN 500 MG 24 hr tablet Commonly known as:  GLUCOPHAGE-XR TAKE 1 TABLET TWICE A DAY BEFORE MEALS What changed:    how much to take  how to take this  when to take this  additional instructions   metoCLOPramide 5 MG tablet Commonly known as:  REGLAN Take 5 mg by mouth See admin instructions. Take 1 tablet 30 mins before meals and 1 tablet at bedtime 4 times daily   multivitamin with minerals Tabs tablet Take 1 tablet by mouth daily.    ondansetron 4 MG tablet Commonly known as:  Zofran Take 1 tablet (4 mg total) by mouth every 8 (eight) hours as needed for nausea or vomiting.   pantoprazole 40 MG tablet Commonly known as:  PROTONIX Take 1 tablet (40 mg total) by mouth 2 (two) times daily.   Probiotic Pearls Caps Take 1 capsule by mouth daily with lunch.   sertraline 25 MG tablet Commonly known as:  ZOLOFT Take 1 tablet (25 mg total) by mouth daily.   spironolactone 25 MG tablet Commonly known as:  ALDACTONE TAKE 1 TABLET BY MOUTH EVERY DAY   torsemide 10 MG tablet Commonly known as:  DEMADEX Take 1 tablet (10 mg total) by mouth daily as needed. What changed:  reasons to take this   traZODone 50 MG tablet Commonly known as:  DESYREL Take 1 tablet (50 mg total) by mouth at bedtime.   valsartan 320 MG tablet Commonly known as:  DIOVAN Take 1 tablet (320 mg total) by mouth daily.   VICKS VAPORUB EX Place 1 application into both nostrils daily as needed (congestion).       Vitals:   03/28/19 0451 03/28/19 0911  BP: (!) 131/45 (!) 137/57  Pulse: (!) 59 66  Resp: 17 18  Temp: 98.4 F (36.9 C) 97.8 F (36.6 C)  SpO2: 91% 95%    Skin clean, dry and intact without evidence of skin break down, no evidence of skin tears noted. IV catheter discontinued intact. Site without signs and symptoms of complications. Dressing and pressure applied. Pt denies pain at this time. No complaints noted.  An After Visit Summary was printed and given to the patient.Patient picked up by [facility] via stretcher, and D/C to Skilled Nursing facility.Marland Kitchen

## 2019-03-28 NOTE — TOC Progression Note (Signed)
Transition of Care [facility]) - Progression Note    Patient Details  Name: Rebecca Walter MRN: [id number] Date of Birth: [birth year]  Transition of Care [facility]) CM/SW Contact  Bartholomew Crews, RN Phone Number: 03/28/2019, 9:56 AM  Clinical Narrative:    Spoke with patient at bedside about plans to transition home. Patient stated that after speaking with her son she wants to pursue short-term rehab at a SNF. Will need FL-2 signed in Epic and 30-day note signed in shadow chart. CM to follow for transition of care needs.   Expected Discharge Plan: Skilled Nursing Facility Barriers to Discharge: SNF Pending bed offer  Expected Discharge Plan and Services Expected Discharge Plan: [place] In-house Referral: Clinical Social Work Discharge Planning Services: CM Consult Post Acute Care Choice: [place] arrangements for the past 2 months: Apartment                 DME Arranged: N/A DME Agency: NA   HH Agency: NA   Social Determinants of Health (SDOH) Interventions    Readmission Risk Interventions No flowsheet data found.

## 2019-03-28 NOTE — Discharge Instructions (Signed)
We made some changes to your blood pressure and anxiety medication while you were here. We will need to recheck bloodwork at some point after you leave the hospital but you have gotten a lot better which is very good.   It has been a pleasure taking care of you! Please call you primary care doctor with any questions.

## 2019-03-28 NOTE — Progress Notes (Addendum)
Family Medicine Teaching Service Daily Progress Note Intern Pager: [phone]  Patient name: Rebecca Walter Gardners record number: [id number] Date of birth: 10-16-1941 Age: 78 y.o. Gender: female  Primary Care Provider: Kathrene Alu, MD Consultants: none Code Status: Full Code   Pt Overview and Major Events to Date:  Admitted: 03/26/2019 for CC: Nausea ("Just feeling sick") and Hyperglycemia  Hospital Day: 3    Assessment and Plan: Rebecca Walter Abascal is a 78 y.o. female admitted for hyponatremia. Her chronic conditions include IDDM, HTN, HFpEF, Anxiety, GERD  Hyponatremia. Improving with fluid restriction. 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 Na 123 on admit>>133 this am - Strict I/Os  - Continue Fluid restriction    IDDM, controlled. A1C 7.0 - Continue lantus 70u daily  - sSSI QHS and TID WC - monitor CBGs  Subclinical Hypothyroidism, stable. TSH 5.368, T3 2.7 and T4 1.0. TSH in December 2019 10.250 - Recheck as outpatient  Elevated Cr. Improving. Peak 1.18 from BL 0.9, now 1.14 - monitor Cr.   Nausea in setting of Chronic abdominal pain. Stable, improved - Zofran PRN    HTN & HFpEF. Stable.   - Continue home amlodipine10mg  qd, carvedilol 3.125 mg BID, spironolactone 25mg  daily, torsemide 10mg  daily, irbesartan 300mg  qd   Anxiety - Continue sertraline 25mg  daily, trazodone 50mg  QHS   GERD - Continue PPI and pepcid  Seasonal Allergies - continue home Claritin 10mg  daily    Hyperlipidemia  - Continue home pravastatin 40mg  daily and ASA81 qd  Gout - Continue home allopurinol daily   FEN/GI: carb modified diet PPx: lovenox  Disposition: pending SNF vs HH. Patient is medically ready for DC.  Subjective:  Feels overall well today. Is leaning toward going home after working with OT who told her that she may do okay with HH. She lives alone and thinks she still needs help with some of her activities so still  considering SNF. Otherwise no complaints  Objective: Temp:  [98.1 F (36.7 Walter)-98.4 F (36.9 Walter)] 98.4 F (36.9 Walter) (04/08 0451) Pulse Rate:  [55-65] 59 (04/08 0451) Resp:  [16-21] 17 (04/08 0451) BP: (128-145)/(45-65) 131/45 (04/08 0451) SpO2:  [91 %-100 %] 91 % (04/08 0451) Weight:  [87.8 kg] 87.8 kg (04/07 2124) Physical Exam: General: sitting up on side of bed in NAD, pleasant Cardiovascular: RRR, no murmurs Respiratory: CTAB, NWOB on RA Abdomen: soft, nondistended, + bowel sounds Extremities: WWP, no LE edema  Laboratory: Recent Labs  Lab 03/26/19 0748  WBC 8.4  HGB 12.7  HCT 36.0  PLT 240   Recent Labs  Lab 03/26/19 0748 03/26/19 1623 03/27/19 0455 03/28/19 0353  NA 123* 130* 131* 133*  K 3.7 3.9 4.0 4.0  CL 88* 95* 95* 97*  CO2 19* 25 28 26   BUN 26* 20 25* 25*  CREATININE 1.03* 1.05* 1.18* 1.14*  CALCIUM 9.3 9.1 9.1 9.6  PROT 7.0  --   --   --   BILITOT 0.4  --   --   --   ALKPHOS 43  --   --   --   ALT 16  --   --   --   AST 19  --   --   --   GLUCOSE 264* 166* 117* 110*     Imaging/Diagnostic Tests: No results found.  Rebecca Lope, DO 03/28/2019, 8:33 AM PGY-3, [place] Intern pager: [phone], text pages welcome

## 2019-03-28 NOTE — Evaluation (Signed)
Occupational Therapy Evaluation Patient Details Name: Rebecca Walter MRN: [id number] DOB: Feb 10, 1941 Today's Date: 03/28/2019    History of Present Illness Pt is a 78 y/o female admitted secondary to worsening abdominal pain and hyponatremia. PMH includes DM, HTN, and anxiety.    Clinical Impression   Pt PTA: living alone in apartment, pt reports supportive family. Pt currently limited requiring increaed care for ADL, mobility and safety with current balance deficits. Pt set-upA for UB ADL and modA overall for LB ADL. MinguardA for transfers and short distances with SPC. Pt would greatly benefit from continued OT skilled services for ADL, mobility and safety in SNF setting. OT to follow acutely.  Discussion with pt on SNF vs HHOT, pt agreed to SNF.    Follow Up Recommendations  SNF;Supervision/Assistance - 24 hour    Equipment Recommendations  Tub/shower bench;Other (comment)(also to be determined)    Recommendations for Other Services       Precautions / Restrictions Precautions Precautions: Fall Restrictions Weight Bearing Restrictions: No      Mobility Bed Mobility Overal bed mobility: Needs Assistance Bed Mobility: Supine to Sit     Supine to sit: Supervision     General bed mobility comments: Supervision for safety.   Transfers Overall transfer level: Needs assistance Equipment used: Straight cane Transfers: Sit to/from Stand Sit to Stand: Min guard         General transfer comment: Min guard for steadying assist.     Balance Overall balance assessment: Needs assistance Sitting-balance support: No upper extremity supported;Feet supported Sitting balance-Leahy Scale: Good       Standing balance-Leahy Scale: Poor Standing balance comment: Reliant on UE and external support                            ADL either performed or assessed with clinical judgement   ADL Overall ADL's : Needs assistance/impaired Eating/Feeding: Modified  independent;Sitting   Grooming: Min guard;Standing   Upper Body Bathing: Min guard;Standing   Lower Body Bathing: Moderate assistance;Sitting/lateral leans;Sit to/from stand   Upper Body Dressing : Set up;Sitting   Lower Body Dressing: Moderate assistance;Sitting/lateral leans;Sit to/from stand   Toilet Transfer: Min guard;Rebecca height toilet;Grab bars   Toileting- Water quality scientist and Hygiene: Minimal assistance;Sitting/lateral lean;Sit to/from stand       Functional mobility during ADLs: Min guard;Cueing for safety;Cueing for sequencing(SPC ) General ADL Comments: Pt requiring increased assist with ADL overall and espeically LB as pt fatigued quickly with bathing task at EOB.     Vision Baseline Vision/History: No visual deficits Vision Assessment?: No apparent visual deficits     Perception     Praxis      Pertinent Vitals/Pain Pain Assessment: No/denies pain     Hand Dominance     Extremity/Trunk Assessment Upper Extremity Assessment Upper Extremity Assessment: Generalized weakness   Lower Extremity Assessment Lower Extremity Assessment: Defer to PT evaluation;Generalized weakness   Cervical / Trunk Assessment Cervical / Trunk Assessment: Normal   Communication Communication Communication: No difficulties   Cognition Arousal/Alertness: Awake/alert Behavior During Therapy: Anxious Overall Cognitive Status: Within Functional Limits for tasks assessed                                 General Comments: Pt anxious throughout session, especially when discussing d/c disposition.    General Comments  Discussion for disposition- pt stating that she would be alright  going to SNF for 2 weeks and her son reports to take care of her apartment while she is away. Pt is easily fatigued and requires increased assist beyond her current living situation. pt had aid M and F for IADLs and assist for LB bathing. Pt stating that she does not feel ready to go  home.    Exercises     Shoulder Instructions      Home Living Family/patient expects to be discharged to:: Private residence Living Arrangements: Alone Available Help at Discharge: Family;Available PRN/intermittently Type of Home: Apartment Home Access: Level entry     Home Layout: One level     Bathroom Shower/Tub: Teacher, early years/pre: Standard     Home Equipment: Environmental consultant - 4 wheels;Cane - single point;Bedside commode;Shower seat          Prior Functioning/Environment Level of Independence: Independent with assistive device(s)        Comments: Pt reports she uses Rollator and cane depending on the day. Reports she usually takes a sink bath because she has fallen in her tub before.         OT Problem List: Decreased strength;Decreased activity tolerance;Impaired balance (sitting and/or standing);Decreased coordination;Decreased safety awareness;Pain      OT Treatment/Interventions: Self-care/ADL training;Therapeutic exercise;Energy conservation;Neuromuscular education;DME and/or AE instruction;Therapeutic activities;Patient/family education;Balance training    OT Goals(Current goals can be found in the care plan section) Acute Rehab OT Goals Patient Stated Goal: to be able to move better OT Goal Formulation: With patient Time For Goal Achievement: 04/11/19 Potential to Achieve Goals: Good ADL Goals Pt Will Perform Grooming: with modified independence;standing Pt Will Perform Toileting - Clothing Manipulation and hygiene: with modified independence;sit to/from stand Pt/caregiver will Perform Home Exercise Program: Both right and left upper extremity;With written HEP provided;Independently Additional ADL Goal #1: pt will name 3 energy conservaation techniques to incorporate in ADL and IADL. Additional ADL Goal #2: Pt will state 3 fall risk strategies to reduce risk of falls in ADL and IADL tasks.  OT Frequency: Min 3X/week   Barriers to D/C:  Decreased caregiver support          Co-evaluation              AM-PAC OT "6 Clicks" Daily Activity     Outcome Measure Help from another person eating meals?: None Help from another person taking care of personal grooming?: A Little Help from another person toileting, which includes using toliet, bedpan, or urinal?: A Little Help from another person bathing (including washing, rinsing, drying)?: A Lot Help from another person to put on and taking off regular upper body clothing?: A Little Help from another person to put on and taking off regular lower body clothing?: A Lot 6 Click Score: 17   End of Session Equipment Utilized During Treatment: Other (comment)(SPC) Nurse Communication: Mobility status  Activity Tolerance: Patient limited by fatigue Patient left: in bed;with call bell/phone within reach(on EOB since pt does not like recliner)  OT Visit Diagnosis: Unsteadiness on feet (R26.81);Muscle weakness (generalized) (M62.81)                Time: 8527-7824 OT Time Calculation (min): 32 min Charges:  OT General Charges $OT Visit: 1 Visit OT Evaluation $OT Eval Moderate Complexity: 1 Mod OT Treatments $Self Care/Home Management : 8-22 mins  Rebecca Walter Rebecca Walter) Rebecca Walter OTR/L Acute Rehabilitation Services Pager: [phone] Office: [phone]   Rebecca Walter 03/28/2019, 9:30 AM

## 2019-03-28 NOTE — TOC Transition Note (Addendum)
Transition of Care [facility]) - CM/SW Discharge Note   Patient Details  Name: Rebecca Walter MRN: [id number] Date of Birth: [birth year]  [facility] CM/SW Contact:  Bartholomew Crews, RN Phone Number: 03/28/2019, 2:24 PM   Clinical Narrative:    Patient to transition to [facility]. Room 106. Nurse to call report to [phone]. [facility] to provide transport. Son notified of transition today.   Final next level of care: Skilled Nursing Facility Barriers to Discharge: No Barriers Identified   Patient Goals and CMS Choice   CMS Medicare.gov Compare Post Acute Care list provided to:: Patient Choice offered to / list presented to : Patient  Discharge Placement PASRR number recieved: 03/28/19            Patient chooses bed at: [facility]) Patient to be transferred to facility by: [facility] Name of family member notified: Jeneen Rinks (son) Patient and family notified of of transfer: 03/28/19  Discharge Plan and Services In-house Referral: Clinical Social Work Discharge Planning Services: CM Consult Post Acute Care Choice: [facility]          DME Arranged: N/A DME Agency: NA HH Arranged: NA HH Agency: NA   Social Determinants of Health (SDOH) Interventions     Readmission Risk Interventions No flowsheet data found.

## 2019-03-28 NOTE — Progress Notes (Signed)
Physical Therapy Treatment Patient Details Name: Rebecca Walter MRN: [id number] DOB: 03-16-1941 Today's Date: 03/28/2019    History of Present Illness Pt is a 78 y/o female admitted secondary to worsening abdominal pain and hyponatremia. PMH includes DM, HTN, and anxiety.     PT Comments    Patient ambulating short distances with SPC, unsteadiness and presents as a fall risk. Pt now agreeable for SNF, stating she does not feel well enough to return home. Agree with recs.    Follow Up Recommendations  SNF;Supervision for mobility/OOB     Equipment Recommendations  Rolling walker with 5" wheels    Recommendations for Other Services OT consult     Precautions / Restrictions Precautions Precautions: Fall Restrictions Weight Bearing Restrictions: No    Mobility  Bed Mobility Overal bed mobility: Needs Assistance Bed Mobility: Supine to Sit     Supine to sit: Supervision     General bed mobility comments: Supervision for safety.   Transfers Overall transfer level: Needs assistance Equipment used: Straight cane Transfers: Sit to/from Stand Sit to Stand: Min guard         General transfer comment: Min guard for steadying assist.   Ambulation/Gait Ambulation/Gait assistance: Min guard;Min assist Gait Distance (Feet): 25 Feet Assistive device: Straight cane Gait Pattern/deviations: Step-through pattern;Decreased stride length Gait velocity: decreased   General Gait Details: slow and unsteady, pt reports recent falls, woudl likely be safer with RW at this point.    Stairs             Wheelchair Mobility    Modified Rankin (Stroke Patients Only)       Balance Overall balance assessment: Needs assistance Sitting-balance support: No upper extremity supported;Feet supported Sitting balance-Leahy Scale: Good       Standing balance-Leahy Scale: Poor Standing balance comment: Reliant on UE and external support                              Cognition Arousal/Alertness: Awake/alert Behavior During Therapy: Anxious Overall Cognitive Status: Within Functional Limits for tasks assessed                                 General Comments: Pt anxious throughout session, especially when discussing d/c disposition.       Exercises      General Comments General comments (skin integrity, edema, etc.): Discussion for disposition- pt stating that she would be alright going to SNF for 2 weeks and her son reports to take care of her apartment while she is away. Pt is easily fatigued and requires increased assist beyond her current living situation. pt had aid M and F for IADLs and assist for LB bathing. Pt stating that she does not feel ready to go home.      Pertinent Vitals/Pain Pain Assessment: Faces Faces Pain Scale: Hurts a little bit Pain Location: stomach Pain Intervention(s): Limited activity within patient's tolerance;Monitored during session    [place] expects to be discharged to:: Private residence Living Arrangements: Alone Available Help at Discharge: Family;Available PRN/intermittently Type of Home: Apartment Home Access: Level entry   Home Layout: One level Home Equipment: Walker - 4 wheels;Cane - single point;Bedside commode;Shower seat      Prior Function Level of Independence: Independent with assistive device(s)      Comments: Pt reports she uses Rollator and cane depending on the day. Reports  she usually takes a sink bath because she has fallen in her tub before.    PT Goals (current goals can now be found in the care plan section) Acute Rehab PT Goals Patient Stated Goal: to be able to move better PT Goal Formulation: With patient Time For Goal Achievement: 04/10/19 Potential to Achieve Goals: Good Progress towards PT goals: Progressing toward goals    Frequency    Min 3X/week      PT Plan Current plan remains appropriate    Co-evaluation               AM-PAC PT "6 Clicks" Mobility   Outcome Measure  Help needed turning from your back to your side while in a flat bed without using bedrails?: A Little Help needed moving from lying on your back to sitting on the side of a flat bed without using bedrails?: A Little Help needed moving to and from a bed to a chair (including a wheelchair)?: A Little Help needed standing up from a chair using your arms (e.g., wheelchair or bedside chair)?: A Little Help needed to walk in hospital room?: A Little Help needed climbing 3-5 steps with a railing? : A Lot 6 Click Score: 17    End of Session Equipment Utilized During Treatment: Gait belt Activity Tolerance: Patient tolerated treatment well Patient left: in chair;with call bell/phone within reach;with chair alarm set Nurse Communication: Mobility status PT Visit Diagnosis: Unsteadiness on feet (R26.81);History of falling (Z91.81);Muscle weakness (generalized) (M62.81)     Time: 8295-6213 PT Time Calculation (min) (ACUTE ONLY): 11 min  Charges:  $Gait Training: 8-22 mins                     Reinaldo Berber, PT, DPT Acute Rehabilitation Services Pager: [phone] Office: [phone]     Reinaldo Berber 03/28/2019, 10:39 AM

## 2019-03-29 ENCOUNTER — Ambulatory Visit: Payer: Medicare Other | Admitting: Podiatry

## 2019-03-30 DIAGNOSIS — E871 Hypo-osmolality and hyponatremia: Secondary | ICD-10-CM | POA: Diagnosis not present

## 2019-03-30 DIAGNOSIS — E119 Type 2 diabetes mellitus without complications: Secondary | ICD-10-CM | POA: Diagnosis not present

## 2019-03-30 DIAGNOSIS — Z794 Long term (current) use of insulin: Secondary | ICD-10-CM | POA: Diagnosis not present

## 2019-03-30 DIAGNOSIS — I10 Essential (primary) hypertension: Secondary | ICD-10-CM | POA: Diagnosis not present

## 2019-04-02 DIAGNOSIS — E119 Type 2 diabetes mellitus without complications: Secondary | ICD-10-CM | POA: Diagnosis not present

## 2019-04-02 DIAGNOSIS — I10 Essential (primary) hypertension: Secondary | ICD-10-CM | POA: Diagnosis not present

## 2019-04-02 DIAGNOSIS — Z794 Long term (current) use of insulin: Secondary | ICD-10-CM | POA: Diagnosis not present

## 2019-04-02 DIAGNOSIS — I5030 Unspecified diastolic (congestive) heart failure: Secondary | ICD-10-CM | POA: Diagnosis not present

## 2019-04-03 ENCOUNTER — Other Ambulatory Visit: Payer: Self-pay | Admitting: Family Medicine

## 2019-04-03 ENCOUNTER — Telehealth: Payer: Self-pay | Admitting: Family Medicine

## 2019-04-03 DIAGNOSIS — I5032 Chronic diastolic (congestive) heart failure: Principal | ICD-10-CM

## 2019-04-03 DIAGNOSIS — I5030 Unspecified diastolic (congestive) heart failure: Secondary | ICD-10-CM | POA: Diagnosis not present

## 2019-04-03 DIAGNOSIS — I110 Hypertensive heart disease with heart failure: Secondary | ICD-10-CM

## 2019-04-03 DIAGNOSIS — E871 Hypo-osmolality and hyponatremia: Secondary | ICD-10-CM | POA: Diagnosis not present

## 2019-04-03 DIAGNOSIS — I10 Essential (primary) hypertension: Secondary | ICD-10-CM

## 2019-04-03 NOTE — Telephone Encounter (Signed)
Pt called to inform that she's leaving Nursing Home today, and will be returning home. Please give pt a call per Patient.

## 2019-04-04 ENCOUNTER — Other Ambulatory Visit: Payer: Self-pay | Admitting: Family Medicine

## 2019-04-04 MED ORDER — TRAZODONE HCL 50 MG PO TABS: 100.0000 mg | ORAL_TABLET | Freq: Every day | ORAL | 1 refills | Status: DC

## 2019-04-04 NOTE — Telephone Encounter (Signed)
Returned Rebecca Walter's call today.  We discussed her medication changes and her ongoing anxiety and diarrhea.  We also discussed follow-up after her discharge from [facility] for hyponatremia.  She continues to take her medications as prescribed, including Zoloft 25 mg, BuSpar 15 mg twice daily, and trazodone 50 mg nightly.  She says that the trazodone was initially helpful but has not made much of a difference in her ability to sleep over the last 5 or so nights.  She would like to increase this dose if possible.  She also thinks she would benefit from increasing her Zoloft dose in the future, although we discussed that we need to give this medication more time to achieve its maximum effect before increasing the dosage.  We did decide to increase her trazodone dose to 100 mg nightly.  She is focusing on drinking about half of the water that she used to drink and eating a varied diet to avoid hyponatremia.  I asked her if she would feel comfortable coming into the office to get blood work done to check her sodium level, and she says that she feels very tired right now after just getting home from the nursing home but will likely be able to come in sometime in the next week.  She plans to call the clinic to let us know what day she can come in for her lab, and I will place the order and the lab appointment at that time.  She continues to have loose stools about every other day which are improved with Imodium.  We discussed that this is likely related to IBS given her concomitant anxiety, and as long as they are occurring only intermittently and she is eating and drinking appropriately, she will likely not become dehydrated.  She will follow-up with GI hopefully in May.

## 2019-04-05 ENCOUNTER — Telehealth: Payer: Self-pay | Admitting: Family Medicine

## 2019-04-05 ENCOUNTER — Telehealth: Payer: Self-pay

## 2019-04-05 DIAGNOSIS — M8580 Other specified disorders of bone density and structure, unspecified site: Secondary | ICD-10-CM | POA: Diagnosis not present

## 2019-04-05 DIAGNOSIS — I088 Other rheumatic multiple valve diseases: Secondary | ICD-10-CM | POA: Diagnosis not present

## 2019-04-05 DIAGNOSIS — I5030 Unspecified diastolic (congestive) heart failure: Secondary | ICD-10-CM | POA: Diagnosis not present

## 2019-04-05 DIAGNOSIS — K219 Gastro-esophageal reflux disease without esophagitis: Secondary | ICD-10-CM | POA: Diagnosis not present

## 2019-04-05 DIAGNOSIS — M1990 Unspecified osteoarthritis, unspecified site: Secondary | ICD-10-CM | POA: Diagnosis not present

## 2019-04-05 DIAGNOSIS — E871 Hypo-osmolality and hyponatremia: Secondary | ICD-10-CM | POA: Diagnosis not present

## 2019-04-05 DIAGNOSIS — R631 Polydipsia: Secondary | ICD-10-CM | POA: Diagnosis not present

## 2019-04-05 DIAGNOSIS — E02 Subclinical iodine-deficiency hypothyroidism: Secondary | ICD-10-CM | POA: Diagnosis not present

## 2019-04-05 DIAGNOSIS — Z7982 Long term (current) use of aspirin: Secondary | ICD-10-CM | POA: Diagnosis not present

## 2019-04-05 DIAGNOSIS — E785 Hyperlipidemia, unspecified: Secondary | ICD-10-CM | POA: Diagnosis not present

## 2019-04-05 DIAGNOSIS — E119 Type 2 diabetes mellitus without complications: Secondary | ICD-10-CM | POA: Diagnosis not present

## 2019-04-05 DIAGNOSIS — M109 Gout, unspecified: Secondary | ICD-10-CM | POA: Diagnosis not present

## 2019-04-05 DIAGNOSIS — Z794 Long term (current) use of insulin: Secondary | ICD-10-CM | POA: Diagnosis not present

## 2019-04-05 DIAGNOSIS — I110 Hypertensive heart disease with heart failure: Secondary | ICD-10-CM | POA: Diagnosis not present

## 2019-04-05 NOTE — Telephone Encounter (Signed)
Pt called to see if her PCP could give her a call regarding an increase for her medications. Please give patient a call back.

## 2019-04-05 NOTE — Telephone Encounter (Signed)
Patient calling wondering if we can order a follow up blood test to be drawn at home  Left number [phone] but could not read name of nurse.  Called above number - [facility].  The do not do home health. Asked Ms Patti to talk with the nurse when she comes next and she can call for verbal orders if Dr Shan Levans would like to order any.   She has also not had a bowel movement since returned home has stopped imodium and will start a stool softner.    She is staying home

## 2019-04-05 NOTE — Telephone Encounter (Signed)
Rebecca Walter PT called nurse line requesting VO for PT 1x week for 4 weeks. Verbals can be called to [place].

## 2019-04-06 ENCOUNTER — Telehealth: Payer: Self-pay | Admitting: Family Medicine

## 2019-04-06 ENCOUNTER — Telehealth: Payer: Self-pay

## 2019-04-06 ENCOUNTER — Telehealth: Payer: Self-pay | Admitting: Physician Assistant

## 2019-04-06 NOTE — Telephone Encounter (Signed)
Returned Rebecca Walter's call regarding her medications.  She says that the 100 mg dose of trazodone made her feel very groggy the next day, so we decided to go back down to 50 mg nightly or she can cut 1 of the pills in half and take 75 mg nightly.  She says that she thinks that the recent medication changes, including starting Zoloft, increasing her BuSpar to 15 mg twice daily, and adding trazodone, have helped reduce her anxiety.  She is currently concerned that she is a little bit constipated.  Her last bowel movement was on Monday.  She has tried some MiraLAX and fiber supplements.  She denies abdominal pain.  We discussed that she can increase the amount of MiraLAX she is taking and she would benefit from eating fruits and vegetables, drinking an appropriate amount of water, and incorporating physical movement each day.  She is agreeable to this plan.  She says that she asked the home health nurse if she could draw her blood for her follow-up BMP at home, and the nurse said that she could do this.  I am unsure if we can make this work but I would be happy to supply verbal orders if this would be possible.

## 2019-04-06 NOTE — Telephone Encounter (Signed)
Rebecca Walter, with [facility], called nurse line requesting VO for skilled nursing for education.   1 week 3  [phone], ok to LVM

## 2019-04-06 NOTE — Telephone Encounter (Signed)
Thanks Page, no worries.  I called the number provided three times but got no answer, and voice mail was not set up.  Will try again later.

## 2019-04-06 NOTE — Telephone Encounter (Signed)
Hey Page, do the verbal orders need to be one time per week for three weeks or three times for one week?

## 2019-04-06 NOTE — Telephone Encounter (Signed)
Verbal orders given for PT 1x per week for 4 weeks.  Thanks.

## 2019-04-06 NOTE — Telephone Encounter (Signed)
Attempted to call as well... appears to be a fax number... She will call back eventually.

## 2019-04-06 NOTE — Telephone Encounter (Signed)
Spoke with patient who confirmed all demographics.  She prefers phone visit.  She has active My Chart. Virtual consent sent. She will try to have her vitals for the visit. She does not use e-mail and does not have a smart phone.

## 2019-04-06 NOTE — Telephone Encounter (Signed)
One time a week for 3 weeks.

## 2019-04-06 NOTE — Telephone Encounter (Signed)
Pt called to inform PCP that she is OK with increase in her medication. Please give pt a call back.

## 2019-04-10 ENCOUNTER — Telehealth: Payer: Self-pay | Admitting: Family Medicine

## 2019-04-10 NOTE — Telephone Encounter (Signed)
Pt called this morning requesting a call back regarding a few of her medications. Please give pt a call back.

## 2019-04-11 ENCOUNTER — Telehealth (INDEPENDENT_AMBULATORY_CARE_PROVIDER_SITE_OTHER): Payer: Medicare Other | Admitting: Physician Assistant

## 2019-04-11 ENCOUNTER — Telehealth: Payer: Self-pay | Admitting: Family Medicine

## 2019-04-11 ENCOUNTER — Encounter: Payer: Self-pay | Admitting: Family Medicine

## 2019-04-11 ENCOUNTER — Encounter: Payer: Self-pay | Admitting: Physician Assistant

## 2019-04-11 ENCOUNTER — Telehealth (INDEPENDENT_AMBULATORY_CARE_PROVIDER_SITE_OTHER): Payer: Medicare Other | Admitting: Family Medicine

## 2019-04-11 ENCOUNTER — Other Ambulatory Visit: Payer: Self-pay

## 2019-04-11 VITALS — BP 137/61 | HR 60 | Ht 60.0 in | Wt 182.0 lb

## 2019-04-11 DIAGNOSIS — Z7982 Long term (current) use of aspirin: Secondary | ICD-10-CM | POA: Diagnosis not present

## 2019-04-11 DIAGNOSIS — I088 Other rheumatic multiple valve diseases: Secondary | ICD-10-CM | POA: Diagnosis not present

## 2019-04-11 DIAGNOSIS — E02 Subclinical iodine-deficiency hypothyroidism: Secondary | ICD-10-CM | POA: Diagnosis not present

## 2019-04-11 DIAGNOSIS — Z794 Long term (current) use of insulin: Secondary | ICD-10-CM

## 2019-04-11 DIAGNOSIS — I10 Essential (primary) hypertension: Secondary | ICD-10-CM

## 2019-04-11 DIAGNOSIS — E119 Type 2 diabetes mellitus without complications: Secondary | ICD-10-CM | POA: Diagnosis not present

## 2019-04-11 DIAGNOSIS — T887XXA Unspecified adverse effect of drug or medicament, initial encounter: Secondary | ICD-10-CM

## 2019-04-11 DIAGNOSIS — R002 Palpitations: Secondary | ICD-10-CM

## 2019-04-11 DIAGNOSIS — I110 Hypertensive heart disease with heart failure: Secondary | ICD-10-CM | POA: Diagnosis not present

## 2019-04-11 DIAGNOSIS — K219 Gastro-esophageal reflux disease without esophagitis: Secondary | ICD-10-CM | POA: Diagnosis not present

## 2019-04-11 DIAGNOSIS — E785 Hyperlipidemia, unspecified: Secondary | ICD-10-CM | POA: Diagnosis not present

## 2019-04-11 DIAGNOSIS — I5032 Chronic diastolic (congestive) heart failure: Secondary | ICD-10-CM | POA: Diagnosis not present

## 2019-04-11 DIAGNOSIS — E871 Hypo-osmolality and hyponatremia: Secondary | ICD-10-CM | POA: Diagnosis not present

## 2019-04-11 DIAGNOSIS — E1143 Type 2 diabetes mellitus with diabetic autonomic (poly)neuropathy: Secondary | ICD-10-CM

## 2019-04-11 DIAGNOSIS — M8580 Other specified disorders of bone density and structure, unspecified site: Secondary | ICD-10-CM | POA: Diagnosis not present

## 2019-04-11 DIAGNOSIS — I5030 Unspecified diastolic (congestive) heart failure: Secondary | ICD-10-CM | POA: Diagnosis not present

## 2019-04-11 DIAGNOSIS — M1990 Unspecified osteoarthritis, unspecified site: Secondary | ICD-10-CM | POA: Diagnosis not present

## 2019-04-11 DIAGNOSIS — M109 Gout, unspecified: Secondary | ICD-10-CM | POA: Diagnosis not present

## 2019-04-11 DIAGNOSIS — R631 Polydipsia: Secondary | ICD-10-CM | POA: Diagnosis not present

## 2019-04-11 MED ORDER — AMLODIPINE BESYLATE 5 MG PO TABS: 10.0000 mg | ORAL_TABLET | Freq: Every day | ORAL | 1 refills | Status: DC

## 2019-04-11 MED ORDER — INSULIN GLARGINE 100 UNIT/ML SOLOSTAR PEN: PEN_INJECTOR | SUBCUTANEOUS | 2 refills | Status: DC

## 2019-04-11 MED ORDER — AMLODIPINE BESYLATE 5 MG PO TABS: 5.0000 mg | ORAL_TABLET | Freq: Every day | ORAL | 1 refills | Status: DC

## 2019-04-11 NOTE — Patient Instructions (Addendum)
Medication Instructions:  Your physician recommends that you continue on your current medications as directed. Please refer to the Current Medication list given to you today.  If you need a refill on your cardiac medications before your next appointment, please call your pharmacy.   Lab work: None ordered  If you have labs (blood work) drawn today and your tests are completely normal, you will receive your results only by: Marland Kitchen MyChart Message (if you have MyChart) OR . A paper copy in the mail If you have any lab test that is abnormal or we need to change your treatment, we will call you to review the results.  Testing/Procedures: None ordered   Follow-Up: At [facility], you and your health needs are our priority.  As part of our continuing mission to provide you with exceptional heart care, we have created designated Provider Care Teams.  These Care Teams include your primary Cardiologist (physician) and Advanced Practice Providers (APPs -  Physician Assistants and Nurse Practitioners) who all work together to provide you with the care you need, when you need it. You will need a follow up appointment in:  4 months.  Please call our office 2 months in advance to schedule this appointment.  You may see Dr. Harrington Challenger or one of the following Advanced Practice Providers on your designated Care Team: Richardson Dopp, PA-C [place], [place] . Daune Perch, NP  Any Other Special Instructions Will Be Listed Below (If Applicable).

## 2019-04-11 NOTE — Telephone Encounter (Signed)
Pt calls nurse line very anxious about her blood sugars and blood pressure. Pt stated her BP this morning was ~132/61, I reassured her this is fine and not to be worried right now. Pt stated her blood sugars have been in the 110s, which has made her feel "weak." Pt continuously asked to speak with [facility] or PCP. I offered her a telemedicine with [facility] and she agreed. Placed on her telemedicine schedule.

## 2019-04-11 NOTE — Telephone Encounter (Signed)
Attempted call.  Possible she is on Telemedicine call at this time.  I have worked with this patient for many years.  I am wiling to discuss options if needed.

## 2019-04-11 NOTE — Progress Notes (Signed)
Virtual Visit via Telephone Note   This visit type was conducted due to national recommendations for restrictions regarding the COVID-19 Pandemic (e.g. social distancing) in an effort to limit this patient's exposure and mitigate transmission in our community.  Due to her co-morbid illnesses, this patient is at least at moderate risk for complications without adequate follow up.  This format is felt to be most appropriate for this patient at this time.  The patient did not have access to video technology/had technical difficulties with video requiring transitioning to audio format only (telephone).  All issues noted in this document were discussed and addressed.  No physical exam could be performed with this format.  Please refer to the patient's chart for her  consent to telehealth for [facility].   Evaluation Performed:  Follow-up visit  Date:  04/11/2019   ID:  Rebecca Walter, DOB April 08, 1941, MRN [id number]  Patient Location: Home Provider Location: Home  PCP:  Kathrene Alu, MD  Cardiologist:  Dr. Harrington Challenger  Chief Complaint:  6 months follow up  History of Present Illness:    Rebecca Walter is a 78 y.o. female with a history of HTN, HL, DM, GERD, chronic distolic CHF and mild AI/MR, moderate PR seen for follow up.   She stopped her carvedilol due to shortness of breath. Clonidine stopped due to intolerance. Myovue 07/2017 with small area of mid anteroseptal and apical defect consistent with ischemia . Low risk LVEF 67%. No further work up.   Vague palpitation when last seen by me 09/2018. Felt mostly anxiety related. Dyspnea felt deconditioning related.  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 Changed anxiety medications.   Patient was seen by PCP this morning for hospital follow up after coming to home from nursing home. Intermittent dizziness. Has discontinued amlodipine. Continued BB, ARB, spironolactone, PRN torsemide.   She was anxious during my  conversation. She wants to seen by GI due to recent hospitalization 2nd to psychogenic polydispia. She denies chest pain, dyspnea, orthopnea, PND, syncope or LE edema. Has low appetite. Baseline she is very anxious person.     The patient does not have symptoms concerning for COVID-19 infection (fever, chills, cough, or new shortness of breath).    Past Medical History:  Diagnosis Date  . Anxiety   . Arthritis   . Chronic diastolic CHF (congestive heart failure) ([place])   . Diabetes mellitus   . GERD (gastroesophageal reflux disease)   . Gout   . Hyperlipidemia   . Hypertension   . Hypertensive heart disease with CHF (congestive heart failure) ([place])   . Mild aortic stenosis 06/2017  . Mild mitral regurgitation 06/2017  . Moderate pulmonary valve insufficiency 06/2017  . Osteopenia    Past Surgical History:  Procedure Laterality Date  . KIDNEY SURGERY    . KNEE SURGERY    . MENISECTOMY    . WRIST SURGERY       Current Meds  Medication Sig  . acetaminophen (TYLENOL) 325 MG tablet Take 325 mg by mouth every 8 (eight) hours as needed (pain).   Marland Kitchen allopurinol (ZYLOPRIM) 100 MG tablet Take 2 tablets (200 mg total) by mouth daily.  Marland Kitchen amLODipine (NORVASC) 5 MG tablet Take 1 tablet (5 mg total) by mouth daily.  Marland Kitchen aspirin 81 MG chewable tablet Chew 1 tablet (81 mg total) by mouth daily.  . baclofen (LIORESAL) 10 MG tablet Take 10 mg by mouth as needed for muscle spasms.  . Blood Glucose Monitoring  Suppl (ACCU-CHEK AVIVA PLUS) w/Device KIT 1 Device by Does not apply route daily.  . busPIRone (BUSPAR) 15 MG tablet Take 1 tablet (15 mg total) by mouth 2 (two) times daily.  . Camphor-Eucalyptus-Menthol (VICKS VAPORUB EX) Place 1 application into both nostrils daily as needed (congestion).  . carvedilol (COREG) 3.125 MG tablet TAKE 1 TABLET (3.125 MG TOTAL) BY MOUTH 2 (TWO) TIMES DAILY WITH A MEAL.  . famotidine (PEPCID) 20 MG tablet Take 20 mg by mouth daily.  Marland Kitchen glucose blood (ACCU-CHEK AVIVA  PLUS) test strip CHECK BLOOD SUGAR FIRST  THING IN THE MORNING BEFORE EATING, AFTER LUNCH, AND  AFTER DINNER TOTAL 3 TIMES  DAILY  . insulin aspart (NOVOLOG FLEXPEN) 100 UNIT/ML FlexPen Inject 9-18 Units into the skin See admin instructions. Inject 15 units subcutaneously daily with breakfast, inject 9 units with lunch as needed for CBG 180 or more, and inject 9 units with supper  . Insulin Glargine (LANTUS SOLOSTAR) 100 UNIT/ML Solostar Pen INJECT 55 UNITS INTO THE SKIN EVERY MORNING.  . Insulin Pen Needle (B-D ULTRAFINE III SHORT PEN) 31G X 8 MM MISC Use to inject insulin 4 times daily or as directed.  . loratadine (CLARITIN) 10 MG tablet Take 10 mg by mouth daily.  Marland Kitchen lovastatin (MEVACOR) 40 MG tablet Take 1 tablet (40 mg total) by mouth daily with supper.  . metFORMIN (GLUCOPHAGE-XR) 500 MG 24 hr tablet TAKE 1 TABLET TWICE A DAY BEFORE MEALS  . metoCLOPramide (REGLAN) 5 MG tablet Take 5 mg by mouth See admin instructions. Take 1 tablet 30 mins before meals and 1 tablet at bedtime 4 times daily  . Multiple Vitamin (MULTIVITAMIN WITH MINERALS) TABS tablet Take 1 tablet by mouth daily.  . ondansetron (ZOFRAN) 4 MG tablet Take 1 tablet (4 mg total) by mouth every 8 (eight) hours as needed for nausea or vomiting.  . pantoprazole (PROTONIX) 40 MG tablet Take 1 tablet (40 mg total) by mouth 2 (two) times daily.  . Probiotic Product (PROBIOTIC PEARLS) CAPS Take 1 capsule by mouth daily with lunch.   . sertraline (ZOLOFT) 25 MG tablet Take 1 tablet (25 mg total) by mouth daily.  Marland Kitchen spironolactone (ALDACTONE) 25 MG tablet TAKE 1 TABLET BY MOUTH EVERY DAY  . torsemide (DEMADEX) 10 MG tablet Take 1 tablet (10 mg total) by mouth daily as needed.  . traZODone (DESYREL) 50 MG tablet Take 2 tablets (100 mg total) by mouth at bedtime.  . valsartan (DIOVAN) 320 MG tablet Take 1 tablet (320 mg total) by mouth daily.     Allergies:   Baclofen; Hctz [hydrochlorothiazide]; Lotensin [benazepril]; Victoza  [liraglutide]; Acyclovir and related; Beta adrenergic blockers; and Losartan   Social History   Tobacco Use  . Smoking status: Never Smoker  . Smokeless tobacco: Never Used  Substance Use Topics  . Alcohol use: No  . Drug use: No     Family Hx: The patient's family history includes Cancer in her brother and sister; Diabetes in her maternal uncle; Heart disease in her brother and father; Hypertension in her father; Stroke in her father and mother.  ROS:   Please see the history of present illness.   All other systems reviewed and are negative.   Prior CV studies:   The following studies were reviewed today:  Echocardiogram: 06/2017 Study Conclusions  - Left ventricle: The cavity size was normal. Wall thickness was increased in a pattern of mild LVH. Systolic function was normal. The estimated ejection fraction was in the  range of 60% to 65%. Doppler parameters are consistent with abnormal left ventricular relaxation (grade 1 diastolic dysfunction). - Aortic valve: There was mild regurgitation. - Mitral valve: There was mild regurgitation. - Left atrium: The atrium was mildly dilated. - Pulmonic valve: There was moderate regurgitation. - Pulmonary arteries: PA peak pressure: 35 mm Hg (S). - Pericardium, extracardiac: A trivial pericardial effusion was identified.  Stress test 08/2017  Nuclear stress EF: 67%.  Defect 1: There is a small defect of mild severity present in the mid anteroseptal and apical septal location.  This is a low risk study.  Findings consistent with ischemia.  Low risk stress nuclear study with a small area of apicoseptal ischemia and normal left ventricular regional and global systolic function. The true apex is spared. Findings may signify distal stenosis in a small LAD artery, but cannot exclude shifting breast attenuation artifact.  Labs/Other Tests and Data Reviewed:    EKG:  No ECG reviewed.  Recent Labs: 03/26/2019: ALT 16;  Hemoglobin 12.7; Platelets 240; TSH 5.368 03/28/2019: BUN 25; Creatinine, Ser 1.14; Potassium 4.0; Sodium 133   Recent Lipid Panel Lab Results  Component Value Date/Time   CHOL 154 10/16/2018 02:15 PM   TRIG 323 (H) 10/16/2018 02:15 PM   HDL 35 (L) 10/16/2018 02:15 PM   CHOLHDL 4.4 10/16/2018 02:15 PM   CHOLHDL 4.2 07/16/2017 04:02 AM   LDLCALC 54 10/16/2018 02:15 PM   LDLDIRECT 103 (H) 03/11/2010 09:27 PM    Wt Readings from Last 3 Encounters:  04/11/19 182 lb (82.6 kg)  03/27/19 193 lb 9 oz (87.8 kg)  03/08/19 192 lb (87.1 kg)     Objective:    Vital Signs:  BP 137/61   Pulse 60   Ht 5' (1.524 m)   Wt 182 lb (82.6 kg)   LMP  (LMP Unknown)   BMI 35.54 kg/m    VITAL SIGNS:  reviewed GEN:  no acute distress NEURO:  alert and oriented x 3 PSYCH:  normal affect. Anxious.  ASSESSMENT & PLAN:    1. HTN -Blood pressure stable.  Agree with medication adjustment by PCP this morning.  2. Chronic diastolic CHF -No exacerbating symptoms.  Continue Spironolactone and PRN torsemide.  3.  Palpitation  -No recurrent symptoms.  4.  GI issue -Per primary care provider.  Pending evaluation by GI.  COVID-19 Education: The signs and symptoms of COVID-19 were discussed with the patient and how to seek care for testing (follow up with PCP or arrange E-visit). The importance of social distancing was discussed today.  Time:   Today, I have spent 10 minutes with the patient with telehealth technology discussing the above problems.     Medication Adjustments/Labs and Tests Ordered: Current medicines are reviewed at length with the patient today.  Concerns regarding medicines are outlined above.   Tests Ordered: No orders of the defined types were placed in this encounter.   Medication Changes: No orders of the defined types were placed in this encounter.   Disposition:  Follow up in 4 month(s)  Signed, Leanor Kail, PA  04/11/2019 2:16 PM    [facility]

## 2019-04-11 NOTE — Progress Notes (Signed)
[place] Telemedicine Visit  Patient consented to have virtual visit. Method of visit: Telephone  Encounter participants: Patient: Rebecca Walter - located at home Provider: Martyn Malay - located at [facility]   Chief Complaint: questions about BP and BG  HPI:  Rebecca Walter is a pleasant 78 y.o. woman with history of type 2 diabetes on insulin, hypertension, heart failure with a preserved ejection fraction and severe anxiety presenting via telephone visit for questions about her blood pressure and blood sugar   In regards to the patient's blood pressure she reports that since leaving the nursing facility to which she was discharged her blood pressures have ranged from 1 23-1 36.  Her diastolic number has ranged from 50-55.  With her diastolic blood pressure being below 55 she feels symptomatic.  She endorses dizziness upon standing and feeling like she is going to fall.  She has stopped taking her amlodipine.  Her medications for hypertension currently include spironolactone 25 mg, carvedilol at 3.25 mg twice daily, Diovan 320 and amlodipine 10.  She takes torsemide as needed.  Denies chest pain or palpitations.    The patient reports her AM fasting blood sugar has been 128-1 20 the last 3 days.  In the nursing home she was getting her blood sugar sugar checked very regularly.  Since coming home her blood sugars have gone down a little bit.  She reports she is eating well.  She has had no more diarrhea.  She had a normal bowel movement yesterday.  She has been eating her typical diet.  She feels her appetite is slightly reduced as she now has a dry mouth in the morning.  She feels when her blood sugars are below 138 she needs to eat something.  She has a history of symptomatic hypoglycemia she reports with blood sugars below this value.  She does not take any short acting insulin.  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  She is limiting her water intake as she was recommended.  She reports her anxiety is increased because in the nursing home she was being checked on constantly.  Now she is at home alone.  Her daughter-in-law is off of work and she does check in about once a day.  She has nursing, physical therapy and a home health aide coming in 4 to 5 days of the week.  She is alone the rest of the days.  She reports her anxiety is increased and her sleep is poor.  She is taking trazodone and is interested in increasing Zoloft to 50 mg.   ROS: per HPI  Pertinent PMHx: HFpEF Hyponatremia- thought to be psychogenic polydipsia HTN Type 2 DM  Anxiety   Exam:  Respiratory: Speaking in full sentences, no distress, talkative   Assessment/Plan: Diagnoses and all orders for this visit:  Essential hypertension, the patient has had a reduction in her appetite and is not drinking as much water now that she has been limited in how much she does drink.  Recommend decreasing amlodipine to 5 mg as she has already discontinued this medication entirely (would keep ARB due to DM, beta-blocker due to HF).  She will monitor her blood pressure.  Discussed with Dr. Valentina Lucks who will call the patient -     amLODipine (NORVASC) 5 MG tablet; Take 1 tablet (5 mg total) by mouth daily.  Type 2 diabetes  mellitus with diabetic autonomic neuropathy, with long-term current use of insulin ([place]) the patient has already self titrated down her Lantus to 60 units per night.  She still continues to feel hypoglycemic symptoms with blood glucoses in the 110s to 120s.  Recommended reducing to 55 units Dr. Valentina Lucks will check in with the patient again tomorrow -     Insulin Glargine (LANTUS SOLOSTAR) 100 UNIT/ML Solostar Pen; INJECT 55 UNITS INTO THE SKIN EVERY MORNING.  Hyponatremia thought to be due to psychogenic polydipsia although this is improved with fluid restriction and normal saline.   Multiple medications can also cause hyponatremia and she is on a number of these which can do so.  Would caution against too many serotonergic agents as SSRIs have been implicated in hyponatremia in the elderly.  Medication side effect, the patient reports a dry mouth in the morning and I am concerned that the trazodone is causing this.  She is taking 100 mg at night.  It appears Dr. Shan Levans went down on the dose earlier in the week. Given that the patient was recently admitted for hyponatremia related to psychogenic polydipsia the dry mouth could again cause an increase in water consumption. Would caution against increasing this medication or adding additional medications which have the side effect. No changes were made to this medication today.  Things Patient Would Like to Continue to Address: - Dry mouth (recommended monitoring at this time) - Sertraline (wants to know if she should increase dose)    Time spent during visit with patient: 15 minutes

## 2019-04-11 NOTE — Telephone Encounter (Signed)
Pt called informing that she Blood sugar has been dropping, and would like a call back from [place].

## 2019-04-12 ENCOUNTER — Telehealth: Payer: Self-pay | Admitting: Family Medicine

## 2019-04-12 ENCOUNTER — Other Ambulatory Visit: Payer: Self-pay | Admitting: Family Medicine

## 2019-04-12 ENCOUNTER — Telehealth: Payer: Self-pay | Admitting: *Deleted

## 2019-04-12 DIAGNOSIS — Z7982 Long term (current) use of aspirin: Secondary | ICD-10-CM | POA: Diagnosis not present

## 2019-04-12 DIAGNOSIS — K219 Gastro-esophageal reflux disease without esophagitis: Secondary | ICD-10-CM | POA: Diagnosis not present

## 2019-04-12 DIAGNOSIS — E119 Type 2 diabetes mellitus without complications: Secondary | ICD-10-CM | POA: Diagnosis not present

## 2019-04-12 DIAGNOSIS — I110 Hypertensive heart disease with heart failure: Secondary | ICD-10-CM | POA: Diagnosis not present

## 2019-04-12 DIAGNOSIS — M109 Gout, unspecified: Secondary | ICD-10-CM | POA: Diagnosis not present

## 2019-04-12 DIAGNOSIS — I5030 Unspecified diastolic (congestive) heart failure: Secondary | ICD-10-CM | POA: Diagnosis not present

## 2019-04-12 DIAGNOSIS — E02 Subclinical iodine-deficiency hypothyroidism: Secondary | ICD-10-CM | POA: Diagnosis not present

## 2019-04-12 DIAGNOSIS — Z794 Long term (current) use of insulin: Secondary | ICD-10-CM | POA: Diagnosis not present

## 2019-04-12 DIAGNOSIS — R631 Polydipsia: Secondary | ICD-10-CM | POA: Diagnosis not present

## 2019-04-12 DIAGNOSIS — M1990 Unspecified osteoarthritis, unspecified site: Secondary | ICD-10-CM | POA: Diagnosis not present

## 2019-04-12 DIAGNOSIS — E871 Hypo-osmolality and hyponatremia: Secondary | ICD-10-CM | POA: Diagnosis not present

## 2019-04-12 DIAGNOSIS — M8580 Other specified disorders of bone density and structure, unspecified site: Secondary | ICD-10-CM | POA: Diagnosis not present

## 2019-04-12 DIAGNOSIS — E785 Hyperlipidemia, unspecified: Secondary | ICD-10-CM | POA: Diagnosis not present

## 2019-04-12 DIAGNOSIS — I088 Other rheumatic multiple valve diseases: Secondary | ICD-10-CM | POA: Diagnosis not present

## 2019-04-12 MED ORDER — SERTRALINE HCL 50 MG PO TABS: 50.0000 mg | ORAL_TABLET | Freq: Every day | ORAL | 2 refills | Status: DC

## 2019-04-12 NOTE — Telephone Encounter (Signed)
Patient reports doing much better today.  Blood sugar was improved today with blood sugar 159.  Patient was comfortable with continuing lantus at 60 units (which she had already taken).   She reported reducing her Novolog to 3 units with meals.  She was comfortable with her regimen today.   She denied weakness and improved blood pressure reading today. Home Diastolic BP > 60 reported.  I agreed with continuation of lower dose amlodipine (5mg ) and NOT 10mg  currenlty.  She reported that her weight is down~ 10 pounds.  I reinforced need to avoid any PRN doses of TORSEMIDE due to her weight and  dry mouth at this time.    Patient desired call from Dr. Shan Levans at some point in the near future.  I did explain how the inpatient service was busy and the resident physicians were working more time in the hospital currently.  She was understanding that a call from Dr. Shan Levans may not be until next week.   Patient appreciated call.  She was reassured that she is doing better today.

## 2019-04-12 NOTE — Telephone Encounter (Signed)
Spoke with Rebecca Walter regarding her request for increasing her Zoloft dose.  Since that she has been on the 25 mg dose for almost 3 weeks, it is appropriate to increase this dose to 50 mg, especially since most efficacy is reported at 50 mg and above.  She is tolerating this medication well.  I sent in the new medication to her pharmacy.  We also discussed [facility] possibly getting labs at her home, which I told her I left verbal orders with the nurse that called our clinic today, which is hopefully what she needs to get this done.  [name] also talked about her intermittent diarrhea and constipation, and I reassured her that this does not pose an immediate danger to her and that she will get GI work-up whenever COVID restrictions are lifted.  She says that PT is going well and that overall she is improved.  I reiterated that she should be careful about drinking an appropriate amount of water to keep her from becoming hyponatremic again.  She expressed understanding.

## 2019-04-12 NOTE — Telephone Encounter (Signed)
Kennieth Rad calls because pt thought she was supposed to have some labs drawn by [facility] today.  Will forward to MD.  We can call Ki[place] back with orders if needed.  Christen Bame, CMA

## 2019-04-12 NOTE — Telephone Encounter (Signed)
Left VM with Kennieth Rad saying that I had not gotten a request for orders for labs, but it would be great for patient to get a BMP drawn by [facility], so I left verbal orders for this.  They can also call our clinic back if they need anything more from me to get this done.

## 2019-04-12 NOTE — Telephone Encounter (Signed)
Thank you for your help Dr. Valentina Lucks!  I will try calling her today.  Thanks for explaining to her that I've been on inpatient.

## 2019-04-18 DIAGNOSIS — M109 Gout, unspecified: Secondary | ICD-10-CM | POA: Diagnosis not present

## 2019-04-18 DIAGNOSIS — Z794 Long term (current) use of insulin: Secondary | ICD-10-CM | POA: Diagnosis not present

## 2019-04-18 DIAGNOSIS — I5030 Unspecified diastolic (congestive) heart failure: Secondary | ICD-10-CM | POA: Diagnosis not present

## 2019-04-18 DIAGNOSIS — E871 Hypo-osmolality and hyponatremia: Secondary | ICD-10-CM | POA: Diagnosis not present

## 2019-04-18 DIAGNOSIS — K219 Gastro-esophageal reflux disease without esophagitis: Secondary | ICD-10-CM | POA: Diagnosis not present

## 2019-04-18 DIAGNOSIS — E02 Subclinical iodine-deficiency hypothyroidism: Secondary | ICD-10-CM | POA: Diagnosis not present

## 2019-04-18 DIAGNOSIS — E785 Hyperlipidemia, unspecified: Secondary | ICD-10-CM | POA: Diagnosis not present

## 2019-04-18 DIAGNOSIS — R631 Polydipsia: Secondary | ICD-10-CM | POA: Diagnosis not present

## 2019-04-18 DIAGNOSIS — M1990 Unspecified osteoarthritis, unspecified site: Secondary | ICD-10-CM | POA: Diagnosis not present

## 2019-04-18 DIAGNOSIS — E119 Type 2 diabetes mellitus without complications: Secondary | ICD-10-CM | POA: Diagnosis not present

## 2019-04-18 DIAGNOSIS — I110 Hypertensive heart disease with heart failure: Secondary | ICD-10-CM | POA: Diagnosis not present

## 2019-04-18 DIAGNOSIS — I088 Other rheumatic multiple valve diseases: Secondary | ICD-10-CM | POA: Diagnosis not present

## 2019-04-18 DIAGNOSIS — M8580 Other specified disorders of bone density and structure, unspecified site: Secondary | ICD-10-CM | POA: Diagnosis not present

## 2019-04-18 DIAGNOSIS — Z7982 Long term (current) use of aspirin: Secondary | ICD-10-CM | POA: Diagnosis not present

## 2019-04-19 DIAGNOSIS — K219 Gastro-esophageal reflux disease without esophagitis: Secondary | ICD-10-CM | POA: Diagnosis not present

## 2019-04-19 DIAGNOSIS — E876 Hypokalemia: Secondary | ICD-10-CM | POA: Diagnosis not present

## 2019-04-19 DIAGNOSIS — E871 Hypo-osmolality and hyponatremia: Secondary | ICD-10-CM | POA: Diagnosis not present

## 2019-04-19 DIAGNOSIS — E119 Type 2 diabetes mellitus without complications: Secondary | ICD-10-CM | POA: Diagnosis not present

## 2019-04-19 DIAGNOSIS — M109 Gout, unspecified: Secondary | ICD-10-CM | POA: Diagnosis not present

## 2019-04-19 DIAGNOSIS — E02 Subclinical iodine-deficiency hypothyroidism: Secondary | ICD-10-CM | POA: Diagnosis not present

## 2019-04-19 DIAGNOSIS — I110 Hypertensive heart disease with heart failure: Secondary | ICD-10-CM | POA: Diagnosis not present

## 2019-04-19 DIAGNOSIS — Z794 Long term (current) use of insulin: Secondary | ICD-10-CM | POA: Diagnosis not present

## 2019-04-19 DIAGNOSIS — R631 Polydipsia: Secondary | ICD-10-CM | POA: Diagnosis not present

## 2019-04-19 DIAGNOSIS — I5030 Unspecified diastolic (congestive) heart failure: Secondary | ICD-10-CM | POA: Diagnosis not present

## 2019-04-19 DIAGNOSIS — E785 Hyperlipidemia, unspecified: Secondary | ICD-10-CM | POA: Diagnosis not present

## 2019-04-19 DIAGNOSIS — M1990 Unspecified osteoarthritis, unspecified site: Secondary | ICD-10-CM | POA: Diagnosis not present

## 2019-04-19 DIAGNOSIS — I088 Other rheumatic multiple valve diseases: Secondary | ICD-10-CM | POA: Diagnosis not present

## 2019-04-19 DIAGNOSIS — Z7982 Long term (current) use of aspirin: Secondary | ICD-10-CM | POA: Diagnosis not present

## 2019-04-19 DIAGNOSIS — M8580 Other specified disorders of bone density and structure, unspecified site: Secondary | ICD-10-CM | POA: Diagnosis not present

## 2019-04-19 NOTE — Telephone Encounter (Signed)
Patient called directly to office this AM concerned with LOW blood pressure readings and LOW heart rate.   (Note: She sounded calm, relaxed and not overly concerned with this complaint.)  Blood pressure readings reported yesterday:  Systolic [id number]    Diastolic 09-98 Heart Rate reported yesterday: 51-58  Blood Pressure this AM:  144/65 Heart Rate this AM:  55   States she has been taking: amlodipine 10mg  (not 5mg  - prescribed dose)  carvedilol 3.125mg  BID Valsartan spironolactone  Following discussion of options we agreed to:  Stop/Hold Carvedilol at this time.  This may need to be stopped long-term.  Hold the amlodipine until mid-day:   If blood pressure remains > 338 and > 60 diastolic she could take take amlodipine 5mg  once today.   I asked her to follow up by phone tomorrow 5/1 if she continues to have Blood Pressure control concerns.  (I will be out of the office tomorrow). I made her aware I would share with PCP ([name]) and Cardiologist Harrington Challenger).  She was appreciative of the update in her treatment plan.

## 2019-04-19 NOTE — Telephone Encounter (Signed)
-----   Message from Leavy Cella, [facility] sent at 04/12/2019 11:32 AM EDT ----- Regarding: DM  follow-up Check for DM control - anxiety and BP control

## 2019-04-20 ENCOUNTER — Other Ambulatory Visit: Payer: Self-pay

## 2019-04-20 NOTE — Patient Outreach (Signed)
[facility]) Care Management  04/20/2019  Vassie C Mathisen 1941-05-06 [id number]   Medication Adherence call to Mrs. Eiman Fonner [facility] spoke with patient she is due on Valsartan she is still taking 1 tablet daily and does not need any at this time patient did not want to engage . Mrs. Mansour is showing past due under [place].   [facility] Direct Dial [phone]  Fax [phone] [email or web address]

## 2019-04-23 ENCOUNTER — Other Ambulatory Visit: Payer: Self-pay | Admitting: Family Medicine

## 2019-04-23 ENCOUNTER — Other Ambulatory Visit: Payer: Self-pay | Admitting: Internal Medicine

## 2019-04-24 ENCOUNTER — Other Ambulatory Visit: Payer: Self-pay | Admitting: Family Medicine

## 2019-04-24 NOTE — Telephone Encounter (Signed)
Patient calls and requests a refill for amlodipine.  She reports taking either 2.5mg  or 5mg  depending on her blood pressure.  She also reports taking intermittent valsartan if her systolic blood pressure readings are < 130.   She reports adherence with daily spironolactone 25mg .  She denies use of torsemide PRN recently.   She denies orthostasis but does complain of dry mouth.   Reports NO doses of carvedilol since last week.  However, her heart rate has remained low (50-65) with majority of readings in the 50-59 range.   Blood sugar control is reported as excellent (CBGs low 100s).   She is only taking long-acting insulin.    Agreed to refill amlodipine at 2.5mg  pill size and allow dosing of 2.5-5mg  daily. (Rx as 2.5mg  BID).  I am hopeful we can use the amlodipine 2.5mg  daily as her chronic dose.  Reassess BP in 1 week.

## 2019-04-25 DIAGNOSIS — E02 Subclinical iodine-deficiency hypothyroidism: Secondary | ICD-10-CM | POA: Diagnosis not present

## 2019-04-25 DIAGNOSIS — E119 Type 2 diabetes mellitus without complications: Secondary | ICD-10-CM | POA: Diagnosis not present

## 2019-04-25 DIAGNOSIS — Z7982 Long term (current) use of aspirin: Secondary | ICD-10-CM | POA: Diagnosis not present

## 2019-04-25 DIAGNOSIS — E785 Hyperlipidemia, unspecified: Secondary | ICD-10-CM | POA: Diagnosis not present

## 2019-04-25 DIAGNOSIS — M109 Gout, unspecified: Secondary | ICD-10-CM | POA: Diagnosis not present

## 2019-04-25 DIAGNOSIS — I110 Hypertensive heart disease with heart failure: Secondary | ICD-10-CM | POA: Diagnosis not present

## 2019-04-25 DIAGNOSIS — E871 Hypo-osmolality and hyponatremia: Secondary | ICD-10-CM | POA: Diagnosis not present

## 2019-04-25 DIAGNOSIS — M1990 Unspecified osteoarthritis, unspecified site: Secondary | ICD-10-CM | POA: Diagnosis not present

## 2019-04-25 DIAGNOSIS — K219 Gastro-esophageal reflux disease without esophagitis: Secondary | ICD-10-CM | POA: Diagnosis not present

## 2019-04-25 DIAGNOSIS — R631 Polydipsia: Secondary | ICD-10-CM | POA: Diagnosis not present

## 2019-04-25 DIAGNOSIS — Z794 Long term (current) use of insulin: Secondary | ICD-10-CM | POA: Diagnosis not present

## 2019-04-25 DIAGNOSIS — I088 Other rheumatic multiple valve diseases: Secondary | ICD-10-CM | POA: Diagnosis not present

## 2019-04-25 DIAGNOSIS — I5030 Unspecified diastolic (congestive) heart failure: Secondary | ICD-10-CM | POA: Diagnosis not present

## 2019-04-25 DIAGNOSIS — M8580 Other specified disorders of bone density and structure, unspecified site: Secondary | ICD-10-CM | POA: Diagnosis not present

## 2019-04-26 ENCOUNTER — Telehealth: Payer: Self-pay

## 2019-04-26 DIAGNOSIS — E871 Hypo-osmolality and hyponatremia: Secondary | ICD-10-CM | POA: Diagnosis not present

## 2019-04-26 DIAGNOSIS — M1990 Unspecified osteoarthritis, unspecified site: Secondary | ICD-10-CM | POA: Diagnosis not present

## 2019-04-26 DIAGNOSIS — E119 Type 2 diabetes mellitus without complications: Secondary | ICD-10-CM | POA: Diagnosis not present

## 2019-04-26 DIAGNOSIS — Z794 Long term (current) use of insulin: Secondary | ICD-10-CM | POA: Diagnosis not present

## 2019-04-26 DIAGNOSIS — M8580 Other specified disorders of bone density and structure, unspecified site: Secondary | ICD-10-CM | POA: Diagnosis not present

## 2019-04-26 DIAGNOSIS — M109 Gout, unspecified: Secondary | ICD-10-CM | POA: Diagnosis not present

## 2019-04-26 DIAGNOSIS — E785 Hyperlipidemia, unspecified: Secondary | ICD-10-CM | POA: Diagnosis not present

## 2019-04-26 DIAGNOSIS — K219 Gastro-esophageal reflux disease without esophagitis: Secondary | ICD-10-CM | POA: Diagnosis not present

## 2019-04-26 DIAGNOSIS — R631 Polydipsia: Secondary | ICD-10-CM | POA: Diagnosis not present

## 2019-04-26 DIAGNOSIS — I5030 Unspecified diastolic (congestive) heart failure: Secondary | ICD-10-CM | POA: Diagnosis not present

## 2019-04-26 DIAGNOSIS — I110 Hypertensive heart disease with heart failure: Secondary | ICD-10-CM | POA: Diagnosis not present

## 2019-04-26 DIAGNOSIS — Z7982 Long term (current) use of aspirin: Secondary | ICD-10-CM | POA: Diagnosis not present

## 2019-04-26 DIAGNOSIS — E02 Subclinical iodine-deficiency hypothyroidism: Secondary | ICD-10-CM | POA: Diagnosis not present

## 2019-04-26 DIAGNOSIS — I088 Other rheumatic multiple valve diseases: Secondary | ICD-10-CM | POA: Diagnosis not present

## 2019-04-26 NOTE — Telephone Encounter (Signed)
Verbal orders in.  Thanks!

## 2019-04-26 NOTE — Telephone Encounter (Signed)
Tharon Aquas, PT with [facility], called nurse line requesting VO for continued physical therapy.   1x a week for one week 1x every other week for 4 weeks   [phone] ok to leave a VM.

## 2019-05-02 DIAGNOSIS — E785 Hyperlipidemia, unspecified: Secondary | ICD-10-CM | POA: Diagnosis not present

## 2019-05-02 DIAGNOSIS — E119 Type 2 diabetes mellitus without complications: Secondary | ICD-10-CM | POA: Diagnosis not present

## 2019-05-02 DIAGNOSIS — M8580 Other specified disorders of bone density and structure, unspecified site: Secondary | ICD-10-CM | POA: Diagnosis not present

## 2019-05-02 DIAGNOSIS — I110 Hypertensive heart disease with heart failure: Secondary | ICD-10-CM | POA: Diagnosis not present

## 2019-05-02 DIAGNOSIS — M109 Gout, unspecified: Secondary | ICD-10-CM | POA: Diagnosis not present

## 2019-05-02 DIAGNOSIS — Z794 Long term (current) use of insulin: Secondary | ICD-10-CM | POA: Diagnosis not present

## 2019-05-02 DIAGNOSIS — M1990 Unspecified osteoarthritis, unspecified site: Secondary | ICD-10-CM | POA: Diagnosis not present

## 2019-05-02 DIAGNOSIS — Z7982 Long term (current) use of aspirin: Secondary | ICD-10-CM | POA: Diagnosis not present

## 2019-05-02 DIAGNOSIS — I5030 Unspecified diastolic (congestive) heart failure: Secondary | ICD-10-CM | POA: Diagnosis not present

## 2019-05-02 DIAGNOSIS — K219 Gastro-esophageal reflux disease without esophagitis: Secondary | ICD-10-CM | POA: Diagnosis not present

## 2019-05-02 DIAGNOSIS — R631 Polydipsia: Secondary | ICD-10-CM | POA: Diagnosis not present

## 2019-05-02 DIAGNOSIS — E871 Hypo-osmolality and hyponatremia: Secondary | ICD-10-CM | POA: Diagnosis not present

## 2019-05-02 DIAGNOSIS — E02 Subclinical iodine-deficiency hypothyroidism: Secondary | ICD-10-CM | POA: Diagnosis not present

## 2019-05-02 DIAGNOSIS — I088 Other rheumatic multiple valve diseases: Secondary | ICD-10-CM | POA: Diagnosis not present

## 2019-05-08 ENCOUNTER — Telehealth: Payer: Self-pay | Admitting: Family Medicine

## 2019-05-08 NOTE — Telephone Encounter (Signed)
Pt called this morning stating that she'd like Dr. Shan Levans to give her a call regarding her traZODone. Please give pt a call back.

## 2019-05-10 DIAGNOSIS — E119 Type 2 diabetes mellitus without complications: Secondary | ICD-10-CM | POA: Diagnosis not present

## 2019-05-10 DIAGNOSIS — I110 Hypertensive heart disease with heart failure: Secondary | ICD-10-CM | POA: Diagnosis not present

## 2019-05-10 DIAGNOSIS — Z7982 Long term (current) use of aspirin: Secondary | ICD-10-CM | POA: Diagnosis not present

## 2019-05-10 DIAGNOSIS — M109 Gout, unspecified: Secondary | ICD-10-CM | POA: Diagnosis not present

## 2019-05-10 DIAGNOSIS — I5030 Unspecified diastolic (congestive) heart failure: Secondary | ICD-10-CM | POA: Diagnosis not present

## 2019-05-10 DIAGNOSIS — Z794 Long term (current) use of insulin: Secondary | ICD-10-CM | POA: Diagnosis not present

## 2019-05-10 DIAGNOSIS — E02 Subclinical iodine-deficiency hypothyroidism: Secondary | ICD-10-CM | POA: Diagnosis not present

## 2019-05-10 DIAGNOSIS — M1990 Unspecified osteoarthritis, unspecified site: Secondary | ICD-10-CM | POA: Diagnosis not present

## 2019-05-10 DIAGNOSIS — I088 Other rheumatic multiple valve diseases: Secondary | ICD-10-CM | POA: Diagnosis not present

## 2019-05-10 DIAGNOSIS — E871 Hypo-osmolality and hyponatremia: Secondary | ICD-10-CM | POA: Diagnosis not present

## 2019-05-10 DIAGNOSIS — M8580 Other specified disorders of bone density and structure, unspecified site: Secondary | ICD-10-CM | POA: Diagnosis not present

## 2019-05-10 DIAGNOSIS — R631 Polydipsia: Secondary | ICD-10-CM | POA: Diagnosis not present

## 2019-05-10 DIAGNOSIS — E785 Hyperlipidemia, unspecified: Secondary | ICD-10-CM | POA: Diagnosis not present

## 2019-05-10 DIAGNOSIS — K219 Gastro-esophageal reflux disease without esophagitis: Secondary | ICD-10-CM | POA: Diagnosis not present

## 2019-05-10 NOTE — Telephone Encounter (Signed)
Called Rebecca Walter to check on her sleep and anxiety after initiating Zoloft and trazodone within the last month.  Patient says that she thought that trazodone 50 mg was helping her at the beginning, but then it stopped helping her.  After trying the 100 mg dose, she felt too "drugged out" so she is back at the 50 mg dose.  She would like to restart Ativan because this works much better for her.  I told her that Ativan is not recommended in patients greater than 58 years of age and that it would increase her risk of falling and possible risk of confusion.  I also told her that this medication is a controlled substance that is difficult to stop.  Instead of restarting Ativan, I proposed that we check in in about 1 to 2 weeks to discuss whether she would like to increase her Zoloft to 100 mg daily, since she thinks that this has really helped improve her anxiety.  I also said that she could try cutting 1 of her trazodone pills in half and take 75 mg nightly.  I advised sleep hygiene techniques such as being active during the day, not to looking at a screen for an hour before bedtime, and having a relaxing nightly routine before bed.  I also recommend that she try melatonin again for her sleep.  Rebecca Hemrick C. Shan Levans, MD PGY-2, [facility] 05/10/2019 9:19 AM

## 2019-05-16 ENCOUNTER — Telehealth: Payer: Self-pay

## 2019-05-16 NOTE — Telephone Encounter (Signed)
Pt has an appt for an AWV tomorrow. She said she can talk to you about her sleep medication at that time. No need to call today. Ottis Stain, CMA

## 2019-05-16 NOTE — Telephone Encounter (Signed)
Pt is calling to let Dr. Shan Levans know that she has adjusted her medications like she recommended but she is still having issues being able to sleep.   Pt would like for Dr. Shan Levans to call her to discuss.

## 2019-05-17 ENCOUNTER — Other Ambulatory Visit: Payer: Self-pay

## 2019-05-17 ENCOUNTER — Encounter: Payer: Self-pay | Admitting: Family Medicine

## 2019-05-17 ENCOUNTER — Ambulatory Visit (INDEPENDENT_AMBULATORY_CARE_PROVIDER_SITE_OTHER): Payer: Medicare Other | Admitting: Family Medicine

## 2019-05-17 VITALS — BP 140/56 | Ht 60.0 in | Wt 188.0 lb

## 2019-05-17 DIAGNOSIS — Z0000 Encounter for general adult medical examination without abnormal findings: Secondary | ICD-10-CM | POA: Diagnosis not present

## 2019-05-17 MED ORDER — SERTRALINE HCL 100 MG PO TABS: 100.0000 mg | ORAL_TABLET | Freq: Every day | ORAL | 3 refills | Status: DC

## 2019-05-17 MED ORDER — DOXEPIN HCL 10 MG/ML PO CONC: 6.0000 mg | Freq: Every day | ORAL | 0 refills | Status: DC

## 2019-05-17 NOTE — Progress Notes (Signed)
Subjective:   Rebecca Walter is a 78 y.o. female who presents for Medicare Annual (Subsequent) preventive examination.  The patient consented to a virtual visit.  Review of Systems:  Review of Systems  Constitutional: Negative for fever, malaise/fatigue and weight loss.  HENT: Negative for congestion.   Respiratory: Negative for cough.   Cardiovascular: Negative for chest pain.  Gastrointestinal: Negative for diarrhea, nausea and vomiting.  Genitourinary: Negative for dysuria.  Musculoskeletal: Negative for myalgias.    Cardiac Risk Factors include: advanced age (>50mn, >>101women);diabetes mellitus;dyslipidemia;hypertension;obesity (BMI >30kg/m2);sedentary lifestyle     Objective:     Vitals: BP (!) 140/56   Ht 5' (1.524 m)   Wt 188 lb (85.3 kg)   LMP  (LMP Unknown)   BMI 36.72 kg/m   Body mass index is 36.72 kg/m.  Advanced Directives 05/17/2019 03/26/2019 02/14/2019 01/11/2019 12/29/2018 09/08/2018 05/16/2018  Does Patient Have a Medical Advance Directive? Yes _0  No;Yes  [place] of [place]ive [place] Does patient want to make changes to medical advance directive? No - Patient declined - - - - - -  Copy of [place] Chart? Yes - validated most recent copy scanned in chart (See row information) - - - - - No - copy requested  Would patient like information on creating a medical advance directive? - No - Patient declined No - Patient declined No - Patient declined No - Patient declined No - Patient declined No - Patient declined    Tobacco Social History   Tobacco Use  Smoking Status Never Smoker  Smokeless Tobacco Never Used     Counseling given: Not Answered   Clinical Intake:  Pre-visit preparation completed: No  Pain : No/denies pain     Nutritional Risks: None Diabetes: Yes CBG done?: Yes CBG resulted in Enter/ Edit results?: (128) Did pt. bring in  CBG monitor from home?: Yes Glucose Meter Downloaded?: No  How often do you need to have someone help you when you read instructions, pamphlets, or other written materials from your doctor or pharmacy?: 2 - Rarely What is the last grade level you completed in school?: 12th   Interpreter Needed?: No     Past Medical History:  Diagnosis Date  . Anxiety   . Arthritis   . Chronic diastolic CHF (congestive heart failure) ([place]   . Diabetes mellitus   . GERD (gastroesophageal reflux disease)   . Gout   . Hyperlipidemia   . Hypertension   . Hypertensive heart disease with CHF (congestive heart failure) ([place]   . Mild aortic stenosis 06/2017  . Mild mitral regurgitation 06/2017  . Moderate pulmonary valve insufficiency 06/2017  . Osteopenia    Past Surgical History:  Procedure Laterality Date  . KIDNEY SURGERY    . KNEE SURGERY    . MENISECTOMY    . WRIST SURGERY     Family History  Problem Relation Age of Onset  . Stroke Mother   . Heart disease Father   . Stroke Father   . Hypertension Father   . Cancer Sister   . Cancer Brother   . Heart disease Brother   . Diabetes Maternal Uncle    Social History   Socioeconomic History  . Marital status: Widowed    Spouse name: Not on file  . Number of children: 1  . Years of education: 130 . Highest  education level: Not on file  Occupational History  . Occupation: retired-office work  Scientific laboratory technician  . Financial resource strain: Not on file  . Food insecurity:    Worry: Not on file    Inability: Not on file  . Transportation needs:    Medical: Not on file    Non-medical: Not on file  Tobacco Use  . Smoking status: Never Smoker  . Smokeless tobacco: Never Used  Substance and Sexual Activity  . Alcohol use: No  . Drug use: No  . Sexual activity: Not on file  Lifestyle  . Physical activity:    Days per week: Not on file    Minutes per session: Not on file  . Stress: Not on file  Relationships  . Social connections:     Talks on phone: Not on file    Gets together: Not on file    Attends religious service: Not on file    Active member of club or organization: Not on file    Attends meetings of clubs or organizations: Not on file    Relationship status: Not on file  Other Topics Concern  . Not on file  Social History Narrative   Health Care POA:    Emergency Contact:    End of Life Plan:    Who lives with you: self   Any pets: none   Diet: Pt has a varied diet of protein, starch, and vegetables.   Exercise: Pt reports walking daily to community trash cans.    Seatbelts: Pt reports wearing seatbelt when in vehicles.    Hobbies: growing flowers          Outpatient Encounter Medications as of 05/17/2019  Medication Sig  . carvedilol (COREG) 3.125 MG tablet Take 3.125 mg by mouth daily.  Marland Kitchen torsemide (DEMADEX) 10 MG tablet Take 1 tablet (10 mg total) by mouth daily as needed.  Marland Kitchen acetaminophen (TYLENOL) 325 MG tablet Take 325 mg by mouth every 8 (eight) hours as needed (pain).   Marland Kitchen allopurinol (ZYLOPRIM) 100 MG tablet Take 2 tablets (200 mg total) by mouth daily.  Marland Kitchen amLODipine (NORVASC) 2.5 MG tablet Take 1 tablet (2.5 mg total) by mouth 2 (two) times a day. (Patient taking differently: Take 10 mg by mouth daily. Extra 2.5 mg each afternoon per pt preference)  . aspirin 81 MG chewable tablet Chew 1 tablet (81 mg total) by mouth daily.  . baclofen (LIORESAL) 10 MG tablet Take 10 mg by mouth as needed for muscle spasms.  . Blood Glucose Monitoring Suppl (ACCU-CHEK AVIVA PLUS) w/Device KIT 1 Device by Does not apply route daily.  . busPIRone (BUSPAR) 15 MG tablet Take 1 tablet (15 mg total) by mouth 2 (two) times daily.  . Camphor-Eucalyptus-Menthol (VICKS VAPORUB EX) Place 1 application into both nostrils daily as needed (congestion).  Marland Kitchen doxepin (SINEQUAN) 10 MG/ML solution Take 0.6 mLs (6 mg total) by mouth at bedtime.  . famotidine (PEPCID) 20 MG tablet Take 20 mg by mouth daily.  Marland Kitchen glucose blood  (ACCU-CHEK AVIVA PLUS) test strip CHECK BLOOD SUGAR FIRST  THING IN THE MORNING BEFORE EATING, AFTER LUNCH, AND  AFTER DINNER TOTAL 3 TIMES  DAILY  . insulin aspart (NOVOLOG FLEXPEN) 100 UNIT/ML FlexPen Inject 9-18 Units into the skin See admin instructions. Inject 15 units subcutaneously daily with breakfast, inject 9 units with lunch as needed for CBG 180 or more, and inject 9 units with supper (Patient not taking: Reported on 04/24/2019)  . Insulin Glargine (  LANTUS SOLOSTAR) 100 UNIT/ML Solostar Pen INJECT 55 UNITS INTO THE SKIN EVERY MORNING. (Patient taking differently: 70 Units daily. INJECT 70 UNITS INTO THE SKIN EVERY MORNING.)  . Insulin Pen Needle (B-D ULTRAFINE III SHORT PEN) 31G X 8 MM MISC Use to inject insulin 4 times daily or as directed.  . loratadine (CLARITIN) 10 MG tablet Take 10 mg by mouth daily.  Marland Kitchen lovastatin (MEVACOR) 40 MG tablet Take 1 tablet (40 mg total) by mouth daily with supper.  . metFORMIN (GLUCOPHAGE-XR) 500 MG 24 hr tablet TAKE 1 TABLET TWICE A DAY BEFORE MEALS  . Multiple Vitamin (MULTIVITAMIN WITH MINERALS) TABS tablet Take 1 tablet by mouth daily.  . pantoprazole (PROTONIX) 40 MG tablet Take 1 tablet (40 mg total) by mouth 2 (two) times daily.  . sertraline (ZOLOFT) 100 MG tablet Take 1 tablet (100 mg total) by mouth daily.  Marland Kitchen spironolactone (ALDACTONE) 25 MG tablet TAKE 1 TABLET BY MOUTH EVERY DAY  . traZODone (DESYREL) 50 MG tablet Take 2 tablets (100 mg total) by mouth at bedtime.  . valsartan (DIOVAN) 320 MG tablet Take 1 tablet (320 mg total) by mouth daily.  . [DISCONTINUED] metoCLOPramide (REGLAN) 5 MG tablet Take 5 mg by mouth See admin instructions. Take 1 tablet 30 mins before meals and 1 tablet at bedtime 4 times daily  . [DISCONTINUED] ondansetron (ZOFRAN) 4 MG tablet Take 1 tablet (4 mg total) by mouth every 8 (eight) hours as needed for nausea or vomiting. (Patient not taking: Reported on 05/17/2019)  . [DISCONTINUED] Probiotic Product (PROBIOTIC PEARLS)  CAPS Take 1 capsule by mouth daily with lunch.   . [DISCONTINUED] sertraline (ZOLOFT) 50 MG tablet Take 1 tablet (50 mg total) by mouth daily.   No facility-administered encounter medications on file as of 05/17/2019.     Activities of Daily Living In your present state of health, do you have any difficulty performing the following activities: 05/17/2019 03/26/2019  Hearing? Y N  Vision? Y Y  Difficulty concentrating or making decisions? N N  Walking or climbing stairs? Y Y  Dressing or bathing? N Y  Doing errands, shopping? N Y  Conservation officer, nature and eating ? N -  Using the Toilet? N -  In the past six months, have you accidently leaked urine? N -  Do you have problems with loss of bowel control? N -  Managing your Medications? N -  Managing your Finances? N -  Housekeeping or managing your Housekeeping? Y -  Some recent data might be hidden    Patient Care Team: Kathrene Alu, MD as PCP - General (Family Medicine) Fay Records, MD as PCP - Cardiology (Cardiology) Zadie Rhine Clent Demark, MD ([facility]) Earlean Polka, MD (Ophthalmology)    Assessment:   This is a routine wellness examination for Aleece.  Exercise Activities and Dietary recommendations Current Exercise Habits: The patient does not participate in regular exercise at present, Exercise limited by: cardiac condition(s)  Goals    . Exercise 3x per week (30 min per time)    . Weight (lb) < 180 lb (81.6 kg)       Fall Risk Fall Risk  05/17/2019 02/14/2019 01/11/2019 12/29/2018 10/16/2018  Falls in the past year? _0 0 No  Number falls in past yr: 0 1 1 - -  Injury with Fall? 0 1 1 - -  Risk for fall due to : History of fall(s);Impaired balance/gait - Other (Comment) - -  Risk for fall due to:  Comment - -  just fell in her living room  - -  Follow up Education provided;Falls prevention discussed Falls prevention discussed Falls prevention discussed - -   Is the patient's home free of loose throw rugs in  walkways, pet beds, electrical cords, etc?   yes      Grab bars in the bathroom? no      Handrails on the stairs?   yes      Adequate lighting?   yes  Patient rating of health (0-10) scale: 7   Depression Screen PHQ 2/9 Scores 05/17/2019 02/14/2019 01/11/2019 12/29/2018  PHQ - 2 Score 0 0 0 0  PHQ- 9 Score - - - -     Cognitive Function MMSE - Mini Mental State Exam 09/13/2012  Orientation to time 5  Orientation to Place 5  Registration 3  Attention/ Calculation 5  Recall 2  Language- name 2 objects 2  Language- repeat 1  Language- follow 3 step command 3  Language- read & follow direction 1  Write a sentence 1  Copy design 1  Total score 29     6CIT Screen 05/17/2019  What Year? 0 points  What month? 0 points  What time? 0 points  Count back from 20 0 points  Months in reverse 0 points  Repeat phrase 0 points  Total Score 0    Immunization History  Administered Date(s) Administered  . Influenza Split 11/05/2011, 09/20/2012  . Influenza Whole 11/15/2007, 10/24/2009, 09/25/2010  . Influenza, High Dose Seasonal PF 09/07/2018  . Influenza,inj,Quad PF,6+ Mos 10/08/2014, 10/10/2015, 09/26/2017  . Influenza-Unspecified 10/19/2013, 10/03/2016  . Pneumococcal Conjugate-13 09/26/2017  . Pneumococcal Polysaccharide-23 10/20/2004  . Td 10/20/2002  . Tdap 11/21/2017    Qualifies for Shingles Vaccine? yes  Screening Tests Health Maintenance  Topic Date Due  . OPHTHALMOLOGY EXAM  05/04/2019  . INFLUENZA VACCINE  07/21/2019  . HEMOGLOBIN A1C  09/25/2019  . FOOT EXAM  12/30/2019  . TETANUS/TDAP  11/22/2027  . DEXA SCAN  Completed  . PNA vac Low Risk Adult  Completed    Cancer Screenings: Lung: Low Dose CT Chest recommended if Age 78-80 years, 30 pack-year currently smoking OR have quit w/in 15years. Patient does not qualify. Breast:  Up to date on Mammogram? Yes   Up to date of Bone Density/Dexa? Yes Colorectal: yes  Additional Screenings: : Hepatitis C Screening:  not in age group     Plan:      I have personally reviewed and noted the following in the patient's chart:   . Medical and social history . Use of alcohol, tobacco or illicit drugs  . Current medications and supplements . Functional ability and status . Nutritional status . Physical activity . Advanced directives . List of other physicians . Hospitalizations, surgeries, and ER visits in previous 12 months . Vitals . Screenings to include cognitive, depression, and falls . Referrals and appointments  In addition, I have reviewed and discussed with patient certain preventive protocols, quality metrics, and best practice recommendations. A written personalized care plan for preventive services as well as general preventive health recommendations were provided to patient.    This visit was conducted virtually in the setting of the [place] pandemic.    Kathrene Alu, MD  05/18/2019

## 2019-05-18 ENCOUNTER — Telehealth: Payer: Self-pay

## 2019-05-18 NOTE — Telephone Encounter (Signed)
Received fax from pharmacy, PA needed on Doxepin HCI 10MG /ML.  Clinical questions submitted via Cover My Meds.  Waiting on response, could take up to 72 hours.  Cover My Meds info Key: [id number]

## 2019-05-18 NOTE — Patient Instructions (Signed)
Goals discussed today: you would like to have slow but steady weight loss, with goal weight <180 by the end of this year. You would also like to walk more and will use exercise for weight loss and conditioning. You need to get an eye exam this year.

## 2019-05-18 NOTE — Telephone Encounter (Signed)
The medication is approved through the end of the year. The pharmacy has been notified and so has the patient.

## 2019-05-21 ENCOUNTER — Telehealth: Payer: Self-pay | Admitting: Family Medicine

## 2019-05-21 ENCOUNTER — Other Ambulatory Visit: Payer: Self-pay | Admitting: Family Medicine

## 2019-05-21 MED ORDER — BACLOFEN 10 MG PO TABS: 10.0000 mg | ORAL_TABLET | ORAL | 2 refills | Status: DC | PRN

## 2019-05-21 NOTE — Telephone Encounter (Signed)
Pt called for a refill her baclofen. Please give pt a call back.

## 2019-05-23 DIAGNOSIS — Z7982 Long term (current) use of aspirin: Secondary | ICD-10-CM | POA: Diagnosis not present

## 2019-05-23 DIAGNOSIS — M109 Gout, unspecified: Secondary | ICD-10-CM | POA: Diagnosis not present

## 2019-05-23 DIAGNOSIS — I088 Other rheumatic multiple valve diseases: Secondary | ICD-10-CM | POA: Diagnosis not present

## 2019-05-23 DIAGNOSIS — R631 Polydipsia: Secondary | ICD-10-CM | POA: Diagnosis not present

## 2019-05-23 DIAGNOSIS — E871 Hypo-osmolality and hyponatremia: Secondary | ICD-10-CM | POA: Diagnosis not present

## 2019-05-23 DIAGNOSIS — I5030 Unspecified diastolic (congestive) heart failure: Secondary | ICD-10-CM | POA: Diagnosis not present

## 2019-05-23 DIAGNOSIS — M8580 Other specified disorders of bone density and structure, unspecified site: Secondary | ICD-10-CM | POA: Diagnosis not present

## 2019-05-23 DIAGNOSIS — E119 Type 2 diabetes mellitus without complications: Secondary | ICD-10-CM | POA: Diagnosis not present

## 2019-05-23 DIAGNOSIS — I110 Hypertensive heart disease with heart failure: Secondary | ICD-10-CM | POA: Diagnosis not present

## 2019-05-23 DIAGNOSIS — K219 Gastro-esophageal reflux disease without esophagitis: Secondary | ICD-10-CM | POA: Diagnosis not present

## 2019-05-23 DIAGNOSIS — E785 Hyperlipidemia, unspecified: Secondary | ICD-10-CM | POA: Diagnosis not present

## 2019-05-23 DIAGNOSIS — E02 Subclinical iodine-deficiency hypothyroidism: Secondary | ICD-10-CM | POA: Diagnosis not present

## 2019-05-23 DIAGNOSIS — M1990 Unspecified osteoarthritis, unspecified site: Secondary | ICD-10-CM | POA: Diagnosis not present

## 2019-05-23 DIAGNOSIS — Z794 Long term (current) use of insulin: Secondary | ICD-10-CM | POA: Diagnosis not present

## 2019-05-29 ENCOUNTER — Other Ambulatory Visit: Payer: Self-pay | Admitting: Family Medicine

## 2019-05-29 DIAGNOSIS — E1143 Type 2 diabetes mellitus with diabetic autonomic (poly)neuropathy: Secondary | ICD-10-CM

## 2019-05-29 DIAGNOSIS — Z794 Long term (current) use of insulin: Secondary | ICD-10-CM

## 2019-05-30 ENCOUNTER — Encounter: Payer: Self-pay | Admitting: Podiatry

## 2019-05-30 ENCOUNTER — Other Ambulatory Visit: Payer: Self-pay

## 2019-05-30 ENCOUNTER — Ambulatory Visit: Payer: Medicare Other | Admitting: Podiatry

## 2019-05-30 DIAGNOSIS — M79675 Pain in left toe(s): Secondary | ICD-10-CM | POA: Diagnosis not present

## 2019-05-30 DIAGNOSIS — B351 Tinea unguium: Secondary | ICD-10-CM | POA: Diagnosis not present

## 2019-05-30 DIAGNOSIS — M79674 Pain in right toe(s): Secondary | ICD-10-CM | POA: Diagnosis not present

## 2019-05-30 MED ORDER — AMLODIPINE BESYLATE 5 MG PO TABS: 5.0000 mg | ORAL_TABLET | Freq: Every day | ORAL | 1 refills | Status: DC

## 2019-05-30 NOTE — Telephone Encounter (Signed)
Pt called and would like a refill on her Amlodipine and her insulin called in. Jw

## 2019-06-02 ENCOUNTER — Other Ambulatory Visit: Payer: Self-pay | Admitting: Family Medicine

## 2019-06-02 DIAGNOSIS — I10 Essential (primary) hypertension: Secondary | ICD-10-CM

## 2019-06-04 ENCOUNTER — Telehealth: Payer: Self-pay | Admitting: *Deleted

## 2019-06-04 NOTE — Telephone Encounter (Signed)
Pt states that she take 10mg  of amlodipine.  Advised that 5mg  is on her med list so I would need to send a message to the MD. Christen Bame, CMA

## 2019-06-13 NOTE — Progress Notes (Signed)
Subjective:  Rebecca Walter presents to clinic today with cc of  painful, thick, discolored, elongated toenails 1-5 b/l that become tender and cannot cut because of thickness.  Pain is aggravated when wearing enclosed shoe gear.  Kathrene Alu, MD is her PCP and last visit was December 29, 2018.    Current Outpatient Medications:  .  acetaminophen (TYLENOL) 325 MG tablet, Take 325 mg by mouth every 8 (eight) hours as needed (pain). , Disp: , Rfl:  .  allopurinol (ZYLOPRIM) 100 MG tablet, Take 2 tablets (200 mg total) by mouth daily., Disp: 60 tablet, Rfl: 8 .  amLODipine (NORVASC) 5 MG tablet, Take 1 tablet (5 mg total) by mouth at bedtime., Disp: 30 tablet, Rfl: 1 .  aspirin 81 MG chewable tablet, Chew 1 tablet (81 mg total) by mouth daily., Disp: 30 tablet, Rfl: 5 .  baclofen (LIORESAL) 10 MG tablet, Take 1 tablet (10 mg total) by mouth as needed for muscle spasms., Disp: 30 each, Rfl: 2 .  Blood Glucose Monitoring Suppl (ACCU-CHEK AVIVA PLUS) w/Device KIT, 1 Device by Does not apply route daily., Disp: 1 kit, Rfl: 3 .  busPIRone (BUSPAR) 15 MG tablet, Take 1 tablet (15 mg total) by mouth 2 (two) times daily., Disp: 60 tablet, Rfl: 2 .  Camphor-Eucalyptus-Menthol (VICKS VAPORUB EX), Place 1 application into both nostrils daily as needed (congestion)., Disp: , Rfl:  .  carvedilol (COREG) 3.125 MG tablet, Take 3.125 mg by mouth daily. Daily per patient preference, Disp: , Rfl:  .  doxepin (SINEQUAN) 10 MG/ML solution, Take 0.6 mLs (6 mg total) by mouth at bedtime., Disp: 118 mL, Rfl: 0 .  famotidine (PEPCID) 20 MG tablet, Take 20 mg by mouth daily., Disp: , Rfl:  .  glucose blood (ACCU-CHEK AVIVA PLUS) test strip, CHECK BLOOD SUGAR FIRST  THING IN THE MORNING BEFORE EATING, AFTER LUNCH, AND  AFTER DINNER TOTAL 3 TIMES  DAILY, Disp: 300 each, Rfl: 3 .  insulin aspart (NOVOLOG FLEXPEN) 100 UNIT/ML FlexPen, Inject 9-18 Units into the skin See admin instructions. Inject 15 units subcutaneously  daily with breakfast, inject 9 units with lunch as needed for CBG 180 or more, and inject 9 units with supper, Disp: 15 mL, Rfl: 3 .  Insulin Glargine (LANTUS SOLOSTAR) 100 UNIT/ML Solostar Pen, Inject 55 Units into the skin daily. In the morning, Disp: 15 mL, Rfl: 2 .  Insulin Pen Needle (B-D ULTRAFINE III SHORT PEN) 31G X 8 MM MISC, Use to inject insulin 4 times daily or as directed., Disp: 100 each, Rfl: 5 .  loratadine (CLARITIN) 10 MG tablet, Take 10 mg by mouth daily., Disp: , Rfl:  .  lovastatin (MEVACOR) 40 MG tablet, Take 1 tablet (40 mg total) by mouth daily with supper., Disp: 90 tablet, Rfl: 3 .  metFORMIN (GLUCOPHAGE-XR) 500 MG 24 hr tablet, TAKE 1 TABLET TWICE A DAY BEFORE MEALS, Disp: 180 tablet, Rfl: 3 .  Multiple Vitamin (MULTIVITAMIN WITH MINERALS) TABS tablet, Take 1 tablet by mouth daily., Disp: , Rfl:  .  pantoprazole (PROTONIX) 40 MG tablet, Take 1 tablet (40 mg total) by mouth 2 (two) times daily., Disp: 60 tablet, Rfl: 3 .  sertraline (ZOLOFT) 100 MG tablet, Take 1 tablet (100 mg total) by mouth daily., Disp: 30 tablet, Rfl: 3 .  spironolactone (ALDACTONE) 25 MG tablet, TAKE 1 TABLET BY MOUTH EVERY DAY, Disp: 30 tablet, Rfl: 2 .  torsemide (DEMADEX) 10 MG tablet, TAKE 1 TABLET BY MOUTH EVERY DAY  AS NEEDED, Disp: 30 tablet, Rfl: 0 .  traZODone (DESYREL) 50 MG tablet, Take 2 tablets (100 mg total) by mouth at bedtime. (Patient taking differently: Take 50 mg by mouth at bedtime. ), Disp: 60 tablet, Rfl: 1 .  valsartan (DIOVAN) 320 MG tablet, Take 1 tablet (320 mg total) by mouth daily., Disp: 30 tablet, Rfl: 11 .  amLODipine (NORVASC) 10 MG tablet, TAKE 1 TABLET BY MOUTH EVERY DAY, Disp: 90 tablet, Rfl: 1   Allergies  Allergen Reactions  . Baclofen Other (See Comments)    Caused leg weakness  . Hctz [Hydrochlorothiazide] Other (See Comments)    Uric acid elevation and gout.     . Lotensin [Benazepril] Other (See Comments)    Dry cough   . Victoza [Liraglutide] Other (See  Comments)    Tongue Glossitus  . Acyclovir And Related Diarrhea  . Beta Adrenergic Blockers Other (See Comments)    Occurred with metoprolol  REACTION: coughing  . Losartan Diarrhea     Objective: There were no vitals filed for this visit.  Physical Examination:  Vascular Examination: Capillary refill time immediate x 10 digits.  Palpable DP/PT pulses b/l.  Digital hair present b/l.  No edema noted b/l.  Skin temperature gradient WNL b/l.  Dermatological Examination: Skin with normal turgor, texture and tone b/l.  No open wounds b/l.  No interdigital macerations noted b/l.  Elongated, thick, discolored brittle toenails with subungual debris and pain on dorsal palpation of nailbeds 1-5 b/l.  Musculoskeletal Examination: Muscle strength 5/5 to all muscle groups b/l.  No pain, crepitus or joint discomfort with active/passive ROM.  Neurological Examination: Sensation intact 5/5 b/l with 10 gram monofilament.  Vibratory sensation intact b/l.  Proprioceptive sensation intact b/l.  Assessment: Mycotic nail infection with pain 1-5 b/l  Plan: 1. Toenails 1-5 b/l were debrided in length and girth without iatrogenic laceration. 2.  Continue soft, supportive shoe gear daily. 3.  Report any pedal injuries to medical professional. 4.  Follow up 3 months. 5.  Patient/POA to call should there be a question/concern in there interim.

## 2019-06-20 ENCOUNTER — Other Ambulatory Visit: Payer: Self-pay | Admitting: Family Medicine

## 2019-06-25 ENCOUNTER — Other Ambulatory Visit: Payer: Self-pay | Admitting: *Deleted

## 2019-06-25 NOTE — Telephone Encounter (Signed)
Pt is calling for a refill on baclofen 10mg .  She states " I need 60 pills a month because I take 2 a day and also I think the amount needs to be more than 10mg "  Will forward to PCP to advise.  Christen Bame, CMA

## 2019-06-26 ENCOUNTER — Other Ambulatory Visit: Payer: Self-pay | Admitting: Family Medicine

## 2019-06-26 ENCOUNTER — Ambulatory Visit: Payer: Medicare Other | Admitting: Podiatry

## 2019-06-26 MED ORDER — BACLOFEN 10 MG PO TABS: 10.0000 mg | ORAL_TABLET | Freq: Two times a day (BID) | ORAL | 2 refills | Status: DC

## 2019-06-26 MED ORDER — BACLOFEN 10 MG PO TABS: 10.0000 mg | ORAL_TABLET | ORAL | 2 refills | Status: DC | PRN

## 2019-06-26 NOTE — Telephone Encounter (Signed)
Prescription was changed to twice daily dosing.  I am sorry for that error!

## 2019-06-26 NOTE — Telephone Encounter (Signed)
Pt calls again.  We changed the total number to #60 but the directions still say take 1 a day so that how the pharmacy has to fill it.   To PCP. Christen Bame, CMA

## 2019-06-26 NOTE — Telephone Encounter (Signed)
Please let Rebecca Walter know that I have given her 60 pills per month with some refills, but unfortunately 10 mg is the highest dose that is considered safe, so we should not increase her dose to greater than 10 mg.  Thanks!

## 2019-06-26 NOTE — Telephone Encounter (Signed)
Pt informed of below.Rebecca Walter, CMA ? ?

## 2019-06-26 NOTE — Telephone Encounter (Signed)
Pt called about her medication baclofen takes two a day so pt needs 60 tablets instead of 30 tablets. Please give pt a call.

## 2019-06-26 NOTE — Telephone Encounter (Signed)
This was handled in another phone note. Tab Rylee Zimmerman Rumple, CMA

## 2019-06-27 ENCOUNTER — Other Ambulatory Visit: Payer: Self-pay | Admitting: Family Medicine

## 2019-06-28 ENCOUNTER — Other Ambulatory Visit: Payer: Self-pay | Admitting: Gastroenterology

## 2019-06-28 ENCOUNTER — Other Ambulatory Visit: Payer: Self-pay | Admitting: Family Medicine

## 2019-06-28 DIAGNOSIS — R1310 Dysphagia, unspecified: Secondary | ICD-10-CM

## 2019-06-28 DIAGNOSIS — I10 Essential (primary) hypertension: Secondary | ICD-10-CM

## 2019-06-28 MED ORDER — LOVASTATIN 40 MG PO TABS: 40.0000 mg | ORAL_TABLET | Freq: Every day | ORAL | 3 refills | Status: DC

## 2019-06-29 ENCOUNTER — Telehealth: Payer: Self-pay | Admitting: Family Medicine

## 2019-06-29 NOTE — Telephone Encounter (Signed)
Pt would like for Dr. Shan Levans to give her a call when she has a chance. I offered a virtual appointment but pt will only see Dr. Shan Levans and she does not have anything until 07/17.   Pt said she needs to discuss some issues she has had going on and a test she is having done next week on Wednesday

## 2019-06-29 NOTE — Telephone Encounter (Signed)
Returned patient call.  Discussed patient's upcoming imaging for some dysphasia and discussed her concerns about feeling warm and congested.  She has not had a fever and has no other concerning symptoms such as fever, shortness of breath, or cough.  She has also stayed home, which significantly reduces her risk of contracting COVID or another viral URI.  Provided reassurance and encouraged her to continue taking Tylenol if she is worried about a fever.  I will watch for her imaging results from next week.

## 2019-07-04 ENCOUNTER — Ambulatory Visit
Admission: RE | Admit: 2019-07-04 | Discharge: 2019-07-04 | Disposition: A | Discharge status: Home / Self Care | Payer: Medicare Other | Source: Ambulatory Visit | Attending: Gastroenterology | Admitting: Gastroenterology

## 2019-07-04 ENCOUNTER — Other Ambulatory Visit: Payer: Self-pay

## 2019-07-04 ENCOUNTER — Telehealth (INDEPENDENT_AMBULATORY_CARE_PROVIDER_SITE_OTHER): Payer: Medicare Other | Admitting: Family Medicine

## 2019-07-04 VITALS — BP 152/58 | Wt 188.2 lb

## 2019-07-04 DIAGNOSIS — R29898 Other symptoms and signs involving the musculoskeletal system: Secondary | ICD-10-CM | POA: Insufficient documentation

## 2019-07-04 DIAGNOSIS — K224 Dyskinesia of esophagus: Secondary | ICD-10-CM | POA: Diagnosis not present

## 2019-07-04 DIAGNOSIS — R1310 Dysphagia, unspecified: Secondary | ICD-10-CM

## 2019-07-04 NOTE — Assessment & Plan Note (Signed)
Acute onset of perceived bilateral lower extremity weakness with associated fatigue, subjective fever.  Difficult to assess if experiencing objective weakness vs worsening fatigue/tiredness. Differential remains broad, considering structural lumbar/thoracic lesion, hyponatremia, myopathy, viral illness/infectious. Given concerning complaint, feel that it is important for patient to be evaluated in person to assess strength (as this will make quite a difference on work-up moving forward, including necessity for spine imaging) and labs to evaluate for recurrent hyponatremia vs anemia.  Minimal concern for precipitating UTI or pneumonia as she is without urinary symptoms or shortness of breath/coughing.  Additionally, low suspicion for COVID given high amount of self quarantine and would be an atypical presentation, however can always remain on the differential.  Diabetes well controlled, less likely contributing. - Schedule patient for an office visit at 1:30 PM tomorrow 7/16, would have liked her to be seen in the morning however patient states this was not an option for her - Consider BMP, CBC during visit tomorrow to assess above -Encouraged Tylenol, steam inhalation, rest for now and using cane/walker at all times - Discussed strict red flags for presentation to ED prior to visit tomorrow, including but not limited to worsening of weakness, falls, urinary/bowel incontinence/retention, numbness, confusion

## 2019-07-04 NOTE — Progress Notes (Signed)
Rebecca Walter Telemedicine Visit  Patient consented to have virtual visit. Method of visit: Telephone  Encounter participants: Patient: Rebecca Walter - located at home Provider: [name] - located at [facility] Others (if applicable): None  Chief Complaint: Weakness, congestion  HPI: [name] is a 78 year old female with a history of HFpEF, diabetes with neuropathy, and hyponatremia presenting via telephone to discuss the following:  Weakness: Endorses acute onset bilateral lower extremity weakness starting this past weekend, approximately 4 days ago.  Associated with fatigue and congestion, "just does not feel good ".  At baseline she walks with a cane or walker, she can still walk but feels quite wobbly. No falls. She does feel that this is true weakness in her bilateral lower extremities rather than due to feeling generally weak, no difficulties in upper extremity.  No improvement/worsening in weakness with certain movements, LE weak consistently all day. She normally takes baclofen nightly on a chronic basis, however did not take it last night to see if this to make a difference, no change noted.  No associated back pain, urine/bowel retention or incontinence, saddle anesthesia, numbness or paresthesias, facial droop, slurring of speech, rash, leg swelling, or confusion.  She endorses feeling warm, however has not had a fever (checked today and yesterday, 98/97). No dysuria, abdominal pains, N/V.  No known COVID exposures, has been staying at home with her son bring in groceries, lives alone.  Denies any shortness of breath or coughing.  Patient is concerned because she just feels "awful".  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  She tends to drink "a lot "of water throughout the day, recently told by her PCP to decrease this amount.  Does not like she is been drinking more than usual recently.   ROS: per HPI  Pertinent  PMHx: See above  Exam:  Respiratory: Able to speak in full sentences, unlabored breathing  Assessment/Plan:  Leg weakness, bilateral Acute onset of perceived bilateral lower extremity weakness with associated fatigue, subjective fever.  Difficult to assess if experiencing objective weakness vs worsening fatigue/tiredness. Differential remains broad, considering structural lumbar/thoracic lesion, hyponatremia, myopathy, viral illness/infectious. Given concerning complaint, feel that it is important for patient to be evaluated in person to assess strength (as this will make quite a difference on work-up moving forward, including necessity for spine imaging) and labs to evaluate for recurrent hyponatremia vs anemia.  Minimal concern for precipitating UTI or pneumonia as she is without urinary symptoms or shortness of breath/coughing.  Additionally, low suspicion for COVID given high amount of self quarantine and would be an atypical presentation, however can always remain on the differential.  Diabetes well controlled, less likely contributing. - Schedule patient for an office visit at 1:30 PM tomorrow 7/16, would have liked her to be seen in the morning however patient states this was not an option for her - Consider BMP, CBC during visit tomorrow to assess above -Encouraged Tylenol, steam inhalation, rest for now and using cane/walker at all times - Discussed strict red flags for presentation to ED prior to visit tomorrow, including but not limited to worsening of weakness, falls, urinary/bowel incontinence/retention, numbness, confusion     Time spent during visit with patient: 21 minutes

## 2019-07-05 ENCOUNTER — Ambulatory Visit: Payer: Medicare Other | Admitting: Family Medicine

## 2019-07-05 ENCOUNTER — Other Ambulatory Visit: Payer: Self-pay

## 2019-07-05 VITALS — BP 140/60 | HR 73

## 2019-07-05 DIAGNOSIS — R531 Weakness: Secondary | ICD-10-CM

## 2019-07-05 NOTE — Assessment & Plan Note (Addendum)
Patient neurologically intact and no true weakness on exam, which is reassuring.  No low back pain, no saddle anesthesia, therefore doubt cauda equina.  Lung exam with clear lung sounds and good air movement, therefore doubt pneumonia or CHF as cause of weakness.  No hypoglycemic episodes per patient and weakness appears constant and progressively worse, therefore doubt hypoglycemia as cause.  Patient does have a history of hyponatremia with prior hospitalization, believed to be 2/2 tea and toast diet, therefore could consider this as cause.  Could also consider anemia as cause.  No urinary symptoms and no suprapubic tenderness on exam, therefore doubt UTI.  No fevers, doubt infection.  Could consider allergies given increased "congestion."  Could also consider that swallowing issues are causing increased mucus production in throat.  Very low suspicion for COVID given lack of sick contacts, fever.   - BMP, CBC - f/u Monday - appropriate return precautions given, patient voiced understanding - advised to continue nasal saline spray and to gargle with warm salt water

## 2019-07-05 NOTE — Patient Instructions (Signed)
Thank you for coming to see me today. It was a pleasure. Today we talked about:   Your weakness: I want to get some blood work.  I'll call you with the results.    Make an appointment for Monday with follow up.  If you have any questions or concerns, please do not hesitate to call the office at [phone].  Best,   Arizona Constable, DO

## 2019-07-05 NOTE — Progress Notes (Signed)
Subjective: No chief complaint on file.    HPI: Rebecca Walter is a 78 y.o. presenting to clinic today to discuss the following:  1 Leg Weakness Notes that about 1 week ago, she started to have progressively weak in legs, arms, shoulders and overall "not feeling well."  Also notes that she has been having some shortness of breath with exertion.  Notes that she has been having a dry cough.  States, "I try to get stuff up, but I can't."  Feels warm, but has not had a temperature at home.  States that temp yesterday was 98.1, was also 97.9 a few days ago.  Has been having some congestion and is clearing her throat a lot.  Denies sick contacts and states that she has not been leaving her house.  Went yesterday to get swallow test per GI doctor.  She states that "food and pills have been getting stuck."    No bowel incontinence.  States "my bladder has been dripping more the last few days, but I can always feel that and it happens a lot anyway". No numbness or tingling, no saddle anesthesia.  Last fall in January.  Uses cane or walker at home.  In wheelchair today because she is "so weak."  Driven here by her son.  Has history of hyponatremia, but notes that during previous hiospitalization she only had abdominal pain, no weakness. Has been trying to decrease her water intake.  Denies abdominal pain at present.  Has DM.  Notes that her highest has been 171 yesterday, but otherwise, in her normal range.  Denies hypoglycemia.  Denies headaches.  Notes that her right eye seems more "fuzzy" in the last week.  No slurring of speech, no facial droop. No focal weakness.  Also having some mid-back pain for the last week that is occasional, comes and goes, sharp in character, no worsened by movement.  ROS noted in HPI.   Past Medical, Surgical, Social, and Family History Reviewed & Updated per EMR.   Pertinent Historical Findings include:   Social History   Tobacco Use  Smoking Status Never  Smoker  Smokeless Tobacco Never Used      Objective: BP 140/60   Pulse 73   LMP  (LMP Unknown)   SpO2 95%  Vitals and nursing notes reviewed  Physical Exam:  General: 78 y.o. female in NAD HEENT: EOMI, PERRL Neck: supple, no cervical LAD Cardio: RRR, grade II systolic murmur LUSB Lungs: CTAB, no wheezing, no rhonchi, no crackles, no IWOB on RA Abdomen: Soft, non-tender to palpation, non-distended, positive bowel sounds Skin: warm and dry Extremities: No edema, 5/5 strength BUE/BLE, no TTP of length or spine Neuro: CN II-XII grossly intact, sensation intact throughout   No results found. However, due to the size of the patient record, not all encounters were searched. Please check Results Review for a complete set of results.  Assessment/Plan:  Generalized weakness Patient neurologically intact and no true weakness on exam, which is reassuring.  No low back pain, no saddle anesthesia, therefore doubt cauda equina.  Lung exam with clear lung sounds and good air movement, therefore doubt pneumonia or CHF as cause of weakness.  No hypoglycemic episodes per patient and weakness appears constant and progressively worse, therefore doubt hypoglycemia as cause.  Patient does have a history of hyponatremia with prior hospitalization, believed to be 2/2 tea and toast diet, therefore could consider this as cause.  Could also consider anemia as cause.  No  urinary symptoms and no suprapubic tenderness on exam, therefore doubt UTI.  No fevers, doubt infection.  Could consider allergies given increased "congestion."  Could also consider that swallowing issues are causing increased mucus production in throat.  Very low suspicion for COVID given lack of sick contacts, fever.   - BMP, CBC - f/u Monday - appropriate return precautions given, patient voiced understanding - advised to continue nasal saline spray and to gargle with warm salt water    PATIENT EDUCATION PROVIDED: See AVS    Diagnosis  and plan along with any newly prescribed medication(s) were discussed in detail with this patient today. The patient verbalized understanding and agreed with the plan. Patient advised if symptoms worsen return to clinic or ER.    Orders Placed This Encounter  Procedures  . Basic Metabolic Panel  . CBC with Differential    No orders of the defined types were placed in this encounter.    [place] Constable, DO 07/05/2019, 3:28 PM PGY-2 [place]

## 2019-07-06 ENCOUNTER — Telehealth: Payer: Self-pay | Admitting: Family Medicine

## 2019-07-06 DIAGNOSIS — R7989 Other specified abnormal findings of blood chemistry: Secondary | ICD-10-CM

## 2019-07-06 LAB — BASIC METABOLIC PANEL
BUN/Creatinine Ratio: 21 (ref 12–28)
BUN: 18 mg/dL (ref 8–27)
CO2: 23 mmol/L (ref 20–29)
Calcium: 9.7 mg/dL (ref 8.7–10.3)
Chloride: 91 mmol/L — ABNORMAL LOW (ref 96–106)
Creatinine, Ser: 0.87 mg/dL (ref 0.57–1.00)
GFR calc Af Amer: 73 mL/min/{1.73_m2} (ref >59)
GFR calc non Af Amer: 64 mL/min/{1.73_m2} (ref >59)
Glucose: 144 mg/dL — ABNORMAL HIGH (ref 65–99)
Potassium: 5 mmol/L (ref 3.5–5.2)
Sodium: 129 mmol/L — ABNORMAL LOW (ref 134–144)

## 2019-07-06 LAB — CBC WITH DIFFERENTIAL/PLATELET
Basophils Absolute: 0.1 10*3/uL (ref 0.0–0.2)
Basos: 1 %
EOS (ABSOLUTE): 0.2 10*3/uL (ref 0.0–0.4)
Eos: 2 %
Hematocrit: 34.8 % (ref 34.0–46.6)
Hemoglobin: 12.2 g/dL (ref 11.1–15.9)
Immature Grans (Abs): 0.1 10*3/uL (ref 0.0–0.1)
Immature Granulocytes: 1 %
Lymphocytes Absolute: 1.6 10*3/uL (ref 0.7–3.1)
Lymphs: 16 %
MCH: 30.8 pg (ref 26.6–33.0)
MCHC: 35.1 g/dL (ref 31.5–35.7)
MCV: 88 fL (ref 79–97)
Monocytes Absolute: 1.1 10*3/uL — ABNORMAL HIGH (ref 0.1–0.9)
Monocytes: 11 %
Neutrophils Absolute: 6.7 10*3/uL (ref 1.4–7.0)
Neutrophils: 69 %
Platelets: 230 10*3/uL (ref 150–450)
RBC: 3.96 x10E6/uL (ref 3.77–5.28)
RDW: 13.4 % (ref 11.7–15.4)
WBC: 9.6 10*3/uL (ref 3.4–10.8)

## 2019-07-06 NOTE — Telephone Encounter (Signed)
**  After Hours/ Emergency Line Call*  Patient calls in because she is concerned about her Low sodium. She is also feeling hot. Worse with exertion. Checked temperature- no fevers. 97-something usually.  Denies N/V, palpitations, flutter, syncope, other sick symptoms.  Patient endorses having troubles with anxiety that could be contributing to her state. She has a lot of medical issues now  Glucose recently 145.  She has been trying to limit her fluid intake but has to drink a lot to stay cool and to take her pills because she has a hard time swallowing. Drinks about 4 16oz bottles per day. I recommended she do the best she can to stay cool, drink about 1227mL/day and supplement her diet with salt. Also asked her to follow up in office on Monday.

## 2019-07-06 NOTE — Telephone Encounter (Signed)
**  After hours emergency call**  Patient spoke with Dr. Ouida Sills earlier this evening about her hyponatremia from recent office visit on 7/16.  She recalled tonight as she was confused if this was serious or not and wanted more information.  Na 129 on 7/16.  Baseline around 130, most recent value prior to this on 03/28/2019 was 133.  Discussed through basic pathophysiology of hyponatremia and reinforced discussion and recommendations for fluid restriction.  Compromised at three 16 ounce bottles of water daily, from her previous 4+.  Discussed her diet, appears to receive adequate nutrition through this, may add a little bit of table salt to 1 meal (discussed may be to her eggs in the morning, previously has been following a very "low" soft diet due to hypertension).   She also states she is felt very overheated for the past few days, however has not been febrile.  No acute increase tonight.  Has some congestion.  Denies any diaphoresis, flushing, changes in bowel movements, change in appetite, nausea/vomiting.  CBC, BMP on 7/16 unremarkable with the exception of hyponatremia as above.  During our entire conversation with her sodium and about her overheating concern, patient appears to be very anxious about her health.  Discussed rechecking her thyroid labs, history of subclinical hypothyroidism, which she could do at follow-up.  It is unlikely she is now hyperthyroid, however patient is convinced this is the etiology of her "feeling warm" recently. Let her know she could discuss this further at her follow-up visit on 7/21, however patient states she cannot wait that long.  Will schedule a lab visit on 7/20 for her to obtain thyroid studies.  Instructed patient to present to urgent care or the ED if symptoms worsen, develops weakness, confusion, vision changes, shortness of breath.  Patient endorsed understanding.  Lab visit 1:45 PM on 7/20, telemedicine on 7/21 at 945.  Would also consider further follow-up for  anxiety as I suspect this is heightening her concerns about her health.  Will route to PCP and physician she is following up with on 7/21.  [name], DO

## 2019-07-09 ENCOUNTER — Telehealth: Payer: Self-pay | Admitting: Family Medicine

## 2019-07-09 ENCOUNTER — Other Ambulatory Visit: Payer: Self-pay

## 2019-07-09 ENCOUNTER — Other Ambulatory Visit: Payer: Medicare Other

## 2019-07-09 DIAGNOSIS — R7989 Other specified abnormal findings of blood chemistry: Secondary | ICD-10-CM

## 2019-07-09 DIAGNOSIS — E039 Hypothyroidism, unspecified: Secondary | ICD-10-CM | POA: Diagnosis not present

## 2019-07-09 NOTE — Telephone Encounter (Signed)
Pt called stating that she is going to have an procedure within the next 2 weeks, and would her PCP to give her a call, pt stated that she will be making an apt for her procedure today.

## 2019-07-09 NOTE — Telephone Encounter (Signed)
Patient called the after-hours emergency line, she is concerned about her reduction in urine output.  She has no symptoms and feels well otherwise.  She notes that she was also told she has low sodium from a visit on the 16th.  She was told to cut down her water intake to 316 ounce bottles of water from the of normal 5 that she drinks.  She says that she has consistently follow that instruction and since cutting down her water intake by 2 bottles per day she is noticed a reduction in her urine and that concerns her.  She said that she had kidney stones in the past and she is afraid that this will give her kidney stones.  She is insistent that she needs to pee that she is "heading for trouble.  "  She says that she is weight herself and she is down approximately 2 pounds from the 16th.  She said she was 188 then and then spent 2 days weighing 187 and his weight 186 this morning.  She has a phone follow-up with Dr. Zoila Shutter LO.  I told her that her story sounds like her plan is working as intended and that if she is worried about her sodium, she can ask the doctor to order another lab tests for her when they talk on the phone tomorrow.  Dr. Criss Rosales

## 2019-07-10 ENCOUNTER — Encounter: Payer: Self-pay | Admitting: Family Medicine

## 2019-07-10 ENCOUNTER — Telehealth (INDEPENDENT_AMBULATORY_CARE_PROVIDER_SITE_OTHER): Payer: Medicare Other | Admitting: Family Medicine

## 2019-07-10 DIAGNOSIS — E871 Hypo-osmolality and hyponatremia: Secondary | ICD-10-CM

## 2019-07-10 DIAGNOSIS — R531 Weakness: Secondary | ICD-10-CM | POA: Diagnosis not present

## 2019-07-10 LAB — T4, FREE: Free T4: 1.41 ng/dL (ref 0.82–1.77)

## 2019-07-10 LAB — TSH: TSH: 4.27 u[IU]/mL (ref 0.450–4.500)

## 2019-07-10 NOTE — Assessment & Plan Note (Signed)
Reassured as CBC was WNL and she has been improving with decreasing water consumption.  Suspect that hyponatremia was contributing.  Patient also with worsening anxiety, which is supported by her numerous calls to the after hours line.  She was reassured that her thyroid levels were normal and there was no evidence of infection or anemia on her CBC.  Suspect that her anxiety is also playing a large part in her presentation.  Plan for follow up with PCP on 7/24 to discuss this.  UOP also seems to be adequate, and reassured patient of this, given that UOP will decrease with a decrease in oral intake.  Cr was WNL on 7/16.

## 2019-07-10 NOTE — Assessment & Plan Note (Signed)
Doing well with decreasing water intake.  Should repeat Na within 1 week.

## 2019-07-10 NOTE — Progress Notes (Addendum)
[place] Telemedicine Visit  Patient consented to have virtual visit. Method of visit: Telephone  Encounter participants: Patient: Rebecca Walter - located at home Provider: Cleophas Dunker - located at [facility] Others (if applicable): none  Chief Complaint: follow up from appointment 7/16  HPI: Patient seen in office on 7/16 for weakness.  Also noted to have some increased mucus production in her throat and "congestion."  Na was low at 129, therefore patient was contacted on 7/17 to decrease her water intake, as she has a history of hyponatremia 2/2 increased water consumption.  She has been doing well with this and drinking 3-16oz bottles of water a day.  She notes that her weakness has been improving since her last office visit.  She was nervous however, because she has been having less UOP and called the after hours line regarding this.  She reports that she has still been urinating multiple times a day, and that she had a large urine volume this AM.  Patient also called the after hours line on 7/17 and asked for thyroid studies to be ordered for 7/20, given that she has been getting warm and was concerned something was wrong with her thyroid.  These labs were normal and discussed with patient.  She was reassured by this.  Continues to deny fever.  She notes that she continues to have some congestion, but that it is improving.  She also notes that she thinks her anxiety is currently not well controlled.  She is planning to discuss this on 7/24 with her PCP.    ROS: per HPI  Pertinent PMHx: HFpEF, diabetes with neuropathy, and hyponatremia  Exam:  Respiratory: Speaking in complete sentences without evidence of respiratory distress  Assessment/Plan:  Generalized weakness Reassured as CBC was WNL and she has been improving with decreasing water consumption.  Suspect that hyponatremia was contributing.  Patient also with worsening anxiety, which is supported by her  numerous calls to the after hours line.  She was reassured that her thyroid levels were normal and there was no evidence of infection or anemia on her CBC.  Suspect that her anxiety is also playing a large part in her presentation.  Plan for follow up with PCP on 7/24 to discuss this.  UOP also seems to be adequate, and reassured patient of this, given that UOP will decrease with a decrease in oral intake.  Cr was WNL on 7/16.  Hyponatremia Doing well with decreasing water intake.  Should repeat Na within 1 week.    Time spent during visit with patient: 14 minutes

## 2019-07-10 NOTE — Addendum Note (Signed)
Addended by: Cleophas Dunker on: 07/10/2019 09:07 AM   Modules accepted: Orders

## 2019-07-13 ENCOUNTER — Other Ambulatory Visit: Payer: Self-pay

## 2019-07-13 ENCOUNTER — Telehealth (INDEPENDENT_AMBULATORY_CARE_PROVIDER_SITE_OTHER): Payer: Medicare Other | Admitting: Family Medicine

## 2019-07-13 DIAGNOSIS — E871 Hypo-osmolality and hyponatremia: Secondary | ICD-10-CM

## 2019-07-13 DIAGNOSIS — F419 Anxiety disorder, unspecified: Secondary | ICD-10-CM

## 2019-07-13 MED ORDER — SERTRALINE HCL 50 MG PO TABS: 50.0000 mg | ORAL_TABLET | Freq: Every day | ORAL | 3 refills | Status: DC

## 2019-07-13 NOTE — Assessment & Plan Note (Signed)
Will increase patient's Zoloft to 150 mg divided 100 mg in the morning and 50 mg in the afternoon due to patient preference.  Reassured patient that we can continue to increase this dose slowly if needed in the future.  We will also plan to check in once per week and she will plan on writing down her concerns and questions each week to share them all at once with me at our appointment.  We will try to meet in person or over the phone once weekly to discuss her concerns.  She would like to have Dr. Higinio Plan as her next doctor after I graduate because she has had a very good experience with her.

## 2019-07-13 NOTE — Assessment & Plan Note (Signed)
A lab appointment was made for Rebecca Walter on 7/31 to check her BMP in order to assess her kidney function and sodium level.

## 2019-07-13 NOTE — Progress Notes (Signed)
[place] Telemedicine Visit  Patient consented to have virtual visit. Method of visit: Telephone  Encounter participants: Patient: Rebecca Walter - located at home Provider: Kathrene Alu - located at [facility] Others (if applicable): none  Chief Complaint: anxiety  HPI: Rebecca Walter reports that she has been struggling with increased anxiety lately.  She says that she will have episodes of feeling very hot, which she was initially concerned it was due to her thyroid, but those tests came back normal.  She now thinks that these hot episodes are from her anxiety.  She is interested in increasing her dose of Zoloft to 150 mg daily and dividing the dose as 100 mg in the morning and 50 mg in the afternoon since her anxiety worsens throughout the day.  She has upcoming appointments with her eye doctor and with her GI doctor.  Her GI doctor will be doing a study of her esophagus that will require anesthesia, and she is little nervous about this since she was told that she had some difficulty with anesthesia in 2017, although she does not remember having a bad experience at that time.  She was told that her kidney function was a bit lower and thinks it is due to the contrast that she swallowed from her swallow study.  She is also trying not to drink a high quantity of water each day in order to keep her sodium within normal limits.  ROS: per HPI  Pertinent PMHx: Anxiety, diastolic CHF, hypertension, GERD, gastroparesis  Exam:  Respiratory: No shortness of breath or cough heard during our conversation  Assessment/Plan:  Anxiety Will increase patient's Zoloft to 150 mg divided 100 mg in the morning and 50 mg in the afternoon due to patient preference.  Reassured patient that we can continue to increase this dose slowly if needed in the future.  We will also plan to check in once per week and she will plan on writing down her concerns and questions each week to share them  all at once with me at our appointment.  We will try to meet in person or over the phone once weekly to discuss her concerns.  She would like to have Dr. Higinio Plan as her next doctor after I graduate because she has had a very good experience with her.  Hyponatremia A lab appointment was made for Rebecca Walter on 7/31 to check her BMP in order to assess her kidney function and sodium level.    Time spent during visit with patient: 18 minutes

## 2019-07-20 ENCOUNTER — Other Ambulatory Visit (INDEPENDENT_AMBULATORY_CARE_PROVIDER_SITE_OTHER): Payer: Medicare Other

## 2019-07-20 ENCOUNTER — Telehealth (INDEPENDENT_AMBULATORY_CARE_PROVIDER_SITE_OTHER): Payer: Medicare Other | Admitting: Family Medicine

## 2019-07-20 ENCOUNTER — Encounter: Payer: Self-pay | Admitting: Family Medicine

## 2019-07-20 ENCOUNTER — Other Ambulatory Visit: Payer: Self-pay | Admitting: Family Medicine

## 2019-07-20 ENCOUNTER — Other Ambulatory Visit: Payer: Self-pay

## 2019-07-20 VITALS — BP 148/52 | HR 60 | Wt 185.6 lb

## 2019-07-20 DIAGNOSIS — R531 Weakness: Secondary | ICD-10-CM

## 2019-07-20 DIAGNOSIS — E871 Hypo-osmolality and hyponatremia: Secondary | ICD-10-CM | POA: Diagnosis not present

## 2019-07-20 DIAGNOSIS — F419 Anxiety disorder, unspecified: Secondary | ICD-10-CM | POA: Diagnosis not present

## 2019-07-20 DIAGNOSIS — H04123 Dry eye syndrome of bilateral lacrimal glands: Secondary | ICD-10-CM | POA: Diagnosis not present

## 2019-07-20 DIAGNOSIS — E1143 Type 2 diabetes mellitus with diabetic autonomic (poly)neuropathy: Secondary | ICD-10-CM

## 2019-07-20 DIAGNOSIS — Z794 Long term (current) use of insulin: Secondary | ICD-10-CM

## 2019-07-20 LAB — POCT GLYCOSYLATED HEMOGLOBIN (HGB A1C): HbA1c, POC (controlled diabetic range): 6.2 % (ref 0.0–7.0)

## 2019-07-21 LAB — CBC
Hematocrit: 36.3 % (ref 34.0–46.6)
Hemoglobin: 12.1 g/dL (ref 11.1–15.9)
MCH: 30.3 pg (ref 26.6–33.0)
MCHC: 33.3 g/dL (ref 31.5–35.7)
MCV: 91 fL (ref 79–97)
Platelets: 251 10*3/uL (ref 150–450)
RBC: 4 x10E6/uL (ref 3.77–5.28)
RDW: 13.4 % (ref 11.7–15.4)
WBC: 9 10*3/uL (ref 3.4–10.8)

## 2019-07-21 LAB — BASIC METABOLIC PANEL
BUN/Creatinine Ratio: 21 (ref 12–28)
BUN: 22 mg/dL (ref 8–27)
CO2: 21 mmol/L (ref 20–29)
Calcium: 9.8 mg/dL (ref 8.7–10.3)
Chloride: 101 mmol/L (ref 96–106)
Creatinine, Ser: 1.06 mg/dL — ABNORMAL HIGH (ref 0.57–1.00)
GFR calc Af Amer: 58 mL/min/{1.73_m2} — ABNORMAL LOW (ref >59)
GFR calc non Af Amer: 50 mL/min/{1.73_m2} — ABNORMAL LOW (ref >59)
Glucose: 107 mg/dL — ABNORMAL HIGH (ref 65–99)
Potassium: 5.2 mmol/L (ref 3.5–5.2)
Sodium: 139 mmol/L (ref 134–144)

## 2019-07-21 LAB — VITAMIN B12: Vitamin B-12: 542 pg/mL (ref 232–1245)

## 2019-07-21 NOTE — Assessment & Plan Note (Signed)
This is a chronic issue for the patient, so we are checking a CBC and vitamin B12 to ensure that these are not causes.  TSH was normal at her recent check.

## 2019-07-21 NOTE — Assessment & Plan Note (Signed)
Plan to check BMP this afternoon and will follow up on this with the patient early next week.

## 2019-07-21 NOTE — Progress Notes (Signed)
[place] Telemedicine Visit  Patient consented to have virtual visit. Method of visit: Telephone  Encounter participants: Patient: Rebecca Walter - located at home Provider: Kathrene Alu - located at [facility] Others (if applicable): none  Chief Complaint: Follow-up of anxiety, hyponatremia  HPI:  Anxiety Patient reports that her new dose of Zoloft 100 mg in the morning and 50 mg in the afternoon is working very well for her anxiety.  She is feels better than she has felt in a long time.  She is tolerating this increase in her dose well.  Hyponatremia Patient plans to get a BMP checked at our lab this afternoon.  She would also like a CBC and vitamin B12 level since she wants to know whether she should take vitamin B12 supplement.  ROS: per HPI  Pertinent PMHx: Anxiety, type 2 diabetes, CHF, hyponatremia, hypertension  Exam:  Respiratory: Patient able to speak in full sentences without shortness of breath, no cough  Assessment/Plan:  Hyponatremia Plan to check BMP this afternoon and will follow up on this with the patient early next week.  Anxiety Currently much improved on Zoloft.  We will continue this current dose.  I am pleased that patient has found success so far with this medicine.  Generalized weakness This is a chronic issue for the patient, so we are checking a CBC and vitamin B12 to ensure that these are not causes.  TSH was normal at her recent check.    Time spent during visit with patient: 14 minutes

## 2019-07-21 NOTE — Assessment & Plan Note (Signed)
Currently much improved on Zoloft.  We will continue this current dose.  I am pleased that patient has found success so far with this medicine.

## 2019-07-22 ENCOUNTER — Other Ambulatory Visit: Payer: Self-pay | Admitting: Family Medicine

## 2019-07-23 ENCOUNTER — Telehealth: Payer: Self-pay

## 2019-07-23 NOTE — Telephone Encounter (Signed)
Patient LVM on nurse line requesting results from Friday. Please advise.

## 2019-07-24 NOTE — Telephone Encounter (Signed)
LVM on pt home answering machine with information below from Dr. Shan Levans. Ottis Stain, CMA

## 2019-07-24 NOTE — Telephone Encounter (Signed)
-----   Message from Kathrene Alu, MD sent at 07/24/2019  7:55 AM EDT ----- Would you call Rebecca Walter and let her know that all of her lab results are normal?  (Includes sodium level, blood count, B12 level, Hemoglobin A1c and kidney function.)  Her A1c is 6.2, which is the lowest it has been in a while.  Thanks!

## 2019-07-25 ENCOUNTER — Telehealth: Payer: Medicare Other | Admitting: Family Medicine

## 2019-07-26 DIAGNOSIS — Z1159 Encounter for screening for other viral diseases: Secondary | ICD-10-CM | POA: Diagnosis not present

## 2019-07-30 ENCOUNTER — Other Ambulatory Visit: Payer: Self-pay | Admitting: Family Medicine

## 2019-07-30 ENCOUNTER — Other Ambulatory Visit: Payer: Self-pay

## 2019-07-30 ENCOUNTER — Telehealth (INDEPENDENT_AMBULATORY_CARE_PROVIDER_SITE_OTHER): Payer: Medicare Other | Admitting: Family Medicine

## 2019-07-30 DIAGNOSIS — F419 Anxiety disorder, unspecified: Secondary | ICD-10-CM

## 2019-07-30 MED ORDER — SERTRALINE HCL 50 MG PO TABS: 50.0000 mg | ORAL_TABLET | Freq: Every day | ORAL | 3 refills | Status: DC

## 2019-07-30 NOTE — Assessment & Plan Note (Signed)
Currently well controlled.  Will continue Zoloft 100 mg each morning and 50 mg each afternoon.  I have sent in more of the 50 mg tablets [facility] since she says that she is about to run out.  Have scheduled her to check in with me again in 1 week.  Reviewed her lab results with her and discussed that her A1c is very low at 6.2, which means she could decrease her insulin requirement, since such tight control is not recommended in patients greater than 70.  Patient says that she wants to continue her insulin requirement as it currently is, which was her opinion the last time we discussed the subject.

## 2019-07-30 NOTE — Progress Notes (Signed)
[place] Telemedicine Visit  Patient consented to have virtual visit. Method of visit: Telephone  Encounter participants: Patient: Rebecca Walter - located at home Provider: Kathrene Alu - located at [facility] Others (if applicable): none  Chief Complaint: f/u of anxiety  HPI: Patient reports that overall, she is doing well.  She continues to take Zoloft 100 mg each morning and 50 mg each afternoon, and she says that this is working well for her.  She has her esophageal motility study scheduled for tomorrow, 8/11.  Going to cornea doctor on Friday, new Muro eye drops seem to be helpful, Sustain eye drops have also been helpful.  She had some neck pain after taking the Muro eyedrops, but after that went away, she says that she did see better.  ROS: per HPI  Pertinent PMHx: Anxiety, hyponatremia, type 2 diabetes, CHF, hypertension  Exam:  Respiratory: Able to speak in complete sentences without shortness of breath, no coughing  Assessment/Plan:  Anxiety Currently well controlled.  Will continue Zoloft 100 mg each morning and 50 mg each afternoon.  I have sent in more of the [facility] mg [facility] since she says that she is about to run out.  Have scheduled her to check in with me again in 1 week.  Reviewed her lab results with her and discussed that her A1c is very low at 6.2, which means she could decrease her insulin requirement, since such tight control is not recommended in patients greater than 70.  Patient says that she wants to continue her insulin requirement as it currently is, which was her opinion the last time we discussed the subject.    Time spent during visit with patient: 14 minutes

## 2019-07-31 DIAGNOSIS — R1310 Dysphagia, unspecified: Secondary | ICD-10-CM | POA: Diagnosis not present

## 2019-08-03 DIAGNOSIS — H18453 Nodular corneal degeneration, bilateral: Secondary | ICD-10-CM | POA: Diagnosis not present

## 2019-08-03 DIAGNOSIS — Z961 Presence of intraocular lens: Secondary | ICD-10-CM | POA: Diagnosis not present

## 2019-08-06 ENCOUNTER — Telehealth (INDEPENDENT_AMBULATORY_CARE_PROVIDER_SITE_OTHER): Payer: Medicare Other | Admitting: Family Medicine

## 2019-08-06 ENCOUNTER — Other Ambulatory Visit: Payer: Self-pay

## 2019-08-06 DIAGNOSIS — F419 Anxiety disorder, unspecified: Secondary | ICD-10-CM

## 2019-08-06 DIAGNOSIS — M170 Bilateral primary osteoarthritis of knee: Secondary | ICD-10-CM

## 2019-08-06 NOTE — Progress Notes (Signed)
[place] Telemedicine Visit  Patient consented to have virtual visit. Method of visit: Telephone  Encounter participants: Patient: Rebecca Walter - located at home Provider: Kathrene Alu - located at [facility] Others (if applicable): none  Chief Complaint: f/u anxiety  HPI: Had an endoscopy last week, which went well.  Patient is relieved that she did not have any side effects from the anesthesia.  The GI doctor did not find anything concerning on the endoscopy.  Patient says that she has had very minimal to no symptoms for a while.  Saw an ophthalmologist on Friday, August 14.  She has corneal scarring due to chronic dry eye affecting R eye > L eye.  She will likely get a procedure to polish the R eye at [facility].  She is continuing to use three drops per day.  Anxiety is well controlled - needs Zoloft 50 mg released from the pharmacy because the refill was called in sooner than it normally would be since she went through these faster due to taking 2 to make 100 mg earlier.   Has had some knee pain over the weekend, but this has resolved on its own.  She takes Tylenol only for pain.  ROS: per HPI  Pertinent PMHx: Heart failure with preserved ejection fraction, type 2 diabetes, anxiety  Exam:  Respiratory: Speaks in full sentences without shortness of breath  Assessment/Plan:  Anxiety We will continue Zoloft 100 mg each morning and 50 mg each afternoon.  We will also continue meeting over the telephone once per week.  This is working well for this patient.  Osteoarthritis of both knees Encouraged patient to consider buying Vol[facility]ren gel [facility]  Discussed that oral NSAIDs are contraindicated due to her kidney disease, and patient understands this.  We will continue to use Tylenol as well for episodes of future pain, although it is encouraging that her pain has improved on its own.    Time spent during visit with patient: 12  minutes

## 2019-08-06 NOTE — Assessment & Plan Note (Signed)
Encouraged patient to consider buying Voltaren gel and [facility]  Discussed that oral NSAIDs are contraindicated due to her kidney disease, and patient understands this.  We will continue to use Tylenol as well for episodes of future pain, although it is encouraging that her pain has improved on its own.

## 2019-08-06 NOTE — Assessment & Plan Note (Signed)
We will continue Zoloft 100 mg each morning and 50 mg each afternoon.  We will also continue meeting over the telephone once per week.  This is working well for this patient.

## 2019-08-07 ENCOUNTER — Telehealth: Payer: Self-pay

## 2019-08-07 ENCOUNTER — Other Ambulatory Visit: Payer: Self-pay | Admitting: Family Medicine

## 2019-08-07 DIAGNOSIS — E1143 Type 2 diabetes mellitus with diabetic autonomic (poly)neuropathy: Secondary | ICD-10-CM

## 2019-08-07 DIAGNOSIS — Z794 Long term (current) use of insulin: Secondary | ICD-10-CM

## 2019-08-07 NOTE — Telephone Encounter (Signed)

## 2019-08-08 ENCOUNTER — Other Ambulatory Visit: Payer: Self-pay

## 2019-08-08 ENCOUNTER — Encounter: Payer: Self-pay | Admitting: Cardiology

## 2019-08-08 ENCOUNTER — Telehealth (INDEPENDENT_AMBULATORY_CARE_PROVIDER_SITE_OTHER): Payer: Medicare Other | Admitting: Cardiology

## 2019-08-08 VITALS — BP 148/61 | HR 64 | Ht 60.0 in | Wt 189.0 lb

## 2019-08-08 DIAGNOSIS — F419 Anxiety disorder, unspecified: Secondary | ICD-10-CM | POA: Diagnosis not present

## 2019-08-08 DIAGNOSIS — I5032 Chronic diastolic (congestive) heart failure: Secondary | ICD-10-CM

## 2019-08-08 DIAGNOSIS — I10 Essential (primary) hypertension: Secondary | ICD-10-CM

## 2019-08-08 DIAGNOSIS — I110 Hypertensive heart disease with heart failure: Secondary | ICD-10-CM | POA: Diagnosis not present

## 2019-08-08 DIAGNOSIS — R002 Palpitations: Secondary | ICD-10-CM | POA: Diagnosis not present

## 2019-08-08 NOTE — Progress Notes (Signed)
Virtual Visit via Telephone Note   This visit type was conducted due to national recommendations for restrictions regarding the COVID-19 Pandemic (e.g. social distancing) in an effort to limit this patient's exposure and mitigate transmission in our community.  Due to her co-morbid illnesses, this patient is at least at moderate risk for complications without adequate follow up.  This format is felt to be most appropriate for this patient at this time.  The patient did not have access to video technology/had technical difficulties with video requiring transitioning to audio format only (telephone).  All issues noted in this document were discussed and addressed.  No physical exam could be performed with this format.  Please refer to the patient's chart for her  consent to telehealth for [facility].   Date:  08/08/2019   ID:  Rebecca Walter, DOB 1941/11/07, MRN [id number]  Patient Location: Home Provider Location: Home  PCP:  Kathrene Alu, MD  Cardiologist:  Dorris Carnes, MD  Electrophysiologist:  None   Evaluation Performed:  Follow-Up Visit  Chief Complaint: Follow-up hypertension, chronic diastolic CHF  History of Present Illness:    Rebecca Walter is a 78 y.o. female with hypertension, hyperlipidemia, DM type II, GERD, chronic diastolic CHF, mild AI/MR, moderate PR and significant anxiety.  She had previously stopped her carvedilol due to shortness of breath.  Clonidine stopped due to intolerance.  Myoview 07/2017 with small area of mid anteroseptal and apical defect consistent with ischemia, true apex was spared.  Low risk LVEF 67%.  No further work-up.  She has also had vague palpitations felt related to anxiety.  Dyspnea felt related to deconditioning.  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  Anxiety medications were adjusted.  She was last seen by tele-visit on 04/11/2019 by Robbie Lis, PA at which time the patient was stable but having  anxiety.  Amlodipine had just been discontinued earlier that day by her PCP due to complaints of intermittent dizziness.  She was continued on beta-blocker, ARB, Spironolactone and as needed torsemide.  Today she is doing well.  She says that she has back to taking amlodipine 10 mg daily with an occasional extra 2.5 mg at bedtime as needed.  She denies chest pain, shortness of breath, orthopnea, or edema. She elevates her head with several pillows due to acid reflux. She just had EGD and she says esophagus did not need to be stretched.  She denies lightheadedness or syncope.  No palpitations.  Wt has been stable within a pound or two.   She is having some mucous related to allergies.   Home BPs 140's/ 60's. Occ up to 150's.   The patient does not have symptoms concerning for COVID-19 infection (fever, chills, cough, or new shortness of breath).    Past Medical History:  Diagnosis Date  . Anxiety   . Arthritis   . Chronic diastolic CHF (congestive heart failure) ([place])   . Diabetes mellitus   . GERD (gastroesophageal reflux disease)   . Gout   . Hyperlipidemia   . Hypertension   . Hypertensive heart disease with CHF (congestive heart failure) ([place])   . Mild aortic stenosis 06/2017  . Mild mitral regurgitation 06/2017  . Moderate pulmonary valve insufficiency 06/2017  . Osteopenia    Past Surgical History:  Procedure Laterality Date  . KIDNEY SURGERY    . KNEE SURGERY    . MENISECTOMY    . WRIST SURGERY  Current Meds  Medication Sig  . acetaminophen (TYLENOL) 325 MG tablet Take 325 mg by mouth every 8 (eight) hours as needed (pain).   Marland Kitchen allopurinol (ZYLOPRIM) 100 MG tablet Take 2 tablets (200 mg total) by mouth daily.  Marland Kitchen amLODipine (NORVASC) 10 MG tablet TAKE 1 TABLET BY MOUTH EVERY DAY  . amLODipine (NORVASC) 5 MG tablet Take 1 tablet (5 mg total) by mouth at bedtime.  Marland Kitchen aspirin 81 MG chewable tablet Chew 1 tablet (81 mg total) by mouth daily.  . Blood Glucose  Monitoring Suppl (ACCU-CHEK AVIVA PLUS) w/Device KIT 1 Device by Does not apply route daily.  . busPIRone (BUSPAR) 15 MG tablet TAKE 1 TABLET (15 MG TOTAL) BY MOUTH 2 (TWO) TIMES DAILY.  Marland Kitchen Camphor-Eucalyptus-Menthol (VICKS VAPORUB EX) Place 1 application into both nostrils daily as needed (congestion).  . famotidine (PEPCID) 20 MG tablet Take 20 mg by mouth daily.  Marland Kitchen glucose blood (ACCU-CHEK AVIVA PLUS) test strip CHECK BLOOD SUGAR FIRST  THING IN THE MORNING BEFORE EATING, AFTER LUNCH, AND  AFTER DINNER TOTAL 3 TIMES  DAILY  . insulin aspart (NOVOLOG FLEXPEN) 100 UNIT/ML FlexPen Inject 9-18 Units into the skin See admin instructions. Inject 15 units subcutaneously daily with breakfast, inject 9 units with lunch as needed for CBG 180 or more, and inject 9 units with supper  . Insulin Pen Needle (B-D ULTRAFINE III SHORT PEN) 31G X 8 MM MISC Use to inject insulin 4 times daily or as directed.  Marland Kitchen LANTUS SOLOSTAR 100 UNIT/ML Solostar Pen INJECT 55 UNITS INTO THE SKIN DAILY. IN THE MORNING (Patient taking differently: 65 Units. )  . loratadine (CLARITIN) 10 MG tablet Take 10 mg by mouth daily.  Marland Kitchen lovastatin (MEVACOR) 40 MG tablet Take 1 tablet (40 mg total) by mouth daily with supper.  . metFORMIN (GLUCOPHAGE-XR) 500 MG 24 hr tablet TAKE 1 TABLET TWICE A DAY BEFORE MEALS  . Multiple Vitamin (MULTIVITAMIN WITH MINERALS) TABS tablet Take 1 tablet by mouth daily.  . pantoprazole (PROTONIX) 40 MG tablet TAKE 1 TABLET BY MOUTH TWICE A DAY  . sertraline (ZOLOFT) 100 MG tablet Take 1 tablet (100 mg total) by mouth daily.  . sertraline (ZOLOFT) 50 MG tablet Take 1 tablet (50 mg total) by mouth daily.  Marland Kitchen spironolactone (ALDACTONE) 25 MG tablet TAKE 1 TABLET BY MOUTH EVERY DAY  . torsemide (DEMADEX) 10 MG tablet TAKE 1 TABLET BY MOUTH EVERY DAY AS NEEDED  . traZODone (DESYREL) 50 MG tablet Take 2 tablets (100 mg total) by mouth at bedtime. (Patient taking differently: Take 50 mg by mouth at bedtime. )  .  valsartan (DIOVAN) 320 MG tablet Take 1 tablet (320 mg total) by mouth daily.  . [DISCONTINUED] amLODipine (NORVASC) 2.5 MG tablet Take 1 tablet (2.5 mg total) by mouth 2 (two) times a day. (Patient taking differently: Take 10 mg by mouth daily. Extra 2.5 mg each afternoon per pt preference)     Allergies:   Baclofen, Hctz [hydrochlorothiazide], Lotensin [benazepril], Victoza [liraglutide], Acyclovir and related, Beta adrenergic blockers, Losartan, and Other   Social History   Tobacco Use  . Smoking status: Never Smoker  . Smokeless tobacco: Never Used  Substance Use Topics  . Alcohol use: No  . Drug use: No     Family Hx: The patient's family history includes Cancer in her brother and sister; Diabetes in her maternal uncle; Heart disease in her brother and father; Hypertension in her father; Stroke in her father and mother.  ROS:  Please see the history of present illness.     All other systems reviewed and are negative.   Prior CV studies:   The following studies were reviewed today:  Echocardiogram:06/2017 Study Conclusions  - Left ventricle: The cavity size was normal. Wall thickness was increased in a pattern of mild LVH. Systolic function was normal. The estimated ejection fraction was in the range of 60% to 65%. Doppler parameters are consistent with abnormal left ventricular relaxation (grade 1 diastolic dysfunction). - Aortic valve: There was mild regurgitation. - Mitral valve: There was mild regurgitation. - Left atrium: The atrium was mildly dilated. - Pulmonic valve: There was moderate regurgitation. - Pulmonary arteries: PA peak pressure: 35 mm Hg (S). - Pericardium, extracardiac: A trivial pericardial effusion was identified.  Stress test 08/2017  Nuclear stress EF: 67%.  Defect 1: There is a small defect of mild severity present in the mid anteroseptal and apical septal location.  This is a low risk study.  Findings consistent with  ischemia.  Low risk stress nuclear study with a small area of apicoseptal ischemia and normal left ventricular regional and global systolic function. The true apex is spared. Findings may signify distal stenosis in a small LAD artery, but cannot exclude shifting breast attenuation artifact.   Labs/Other Tests and Data Reviewed:    EKG:  No ECG reviewed.  Recent Labs: 03/26/2019: ALT 16 07/09/2019: TSH 4.270 07/20/2019: BUN 22; Creatinine, Ser 1.06; Hemoglobin 12.1; Platelets 251; Potassium 5.2; Sodium 139   Recent Lipid Panel Lab Results  Component Value Date/Time   CHOL 154 10/16/2018 02:15 PM   TRIG 323 (H) 10/16/2018 02:15 PM   HDL 35 (L) 10/16/2018 02:15 PM   CHOLHDL 4.4 10/16/2018 02:15 PM   CHOLHDL 4.2 07/16/2017 04:02 AM   LDLCALC 54 10/16/2018 02:15 PM   LDLDIRECT 103 (H) 03/11/2010 09:27 PM    Wt Readings from Last 3 Encounters:  08/08/19 189 lb (85.7 kg)  07/20/19 185 lb 9.6 oz (84.2 kg)  07/04/19 188 lb 3.2 oz (85.4 kg)     Objective:    Vital Signs:  BP (!) 148/61   Pulse 64   Ht 5' (1.524 m)   Wt 189 lb (85.7 kg)   LMP  (LMP Unknown)   BMI 36.91 kg/m    VITAL SIGNS:  reviewed GEN:  no acute distress RESPIRATORY:  Patient able to speak in complete sentences.  No audible wheezes or cough over the phone. NEURO:  alert and oriented x 3, no obvious focal deficit PSYCH:  normal affect  ASSESSMENT & PLAN:    Essential hypertension -Amlodipine 10 mg, with an occasional extra 2.5 mg as needed. Spironolactone 25 mg daily, valsartan 320 mg daily and torsemide as needed- very rarely  -Blood pressure fairly well controlled. -DASH diet, reviewed limiting salt intake.   Chronic diastolic CHF -No symptoms of volume overload at this time.  Reports stable weight. -Torsemide as needed, only uses rarely  Palpitations -No recent symptoms.   Anxiety -Seems to be well controlled on current medication, Zoloft added in April and she feels that this has helped.      COVID-19 Education: The signs and symptoms of COVID-19 were discussed with the patient and how to seek care for testing (follow up with PCP or arrange E-visit).  The importance of social distancing was discussed today.  Time:   Today, I have spent 16 minutes with the patient with telehealth technology discussing the above problems.     Medication Adjustments/Labs and  Tests Ordered: Current medicines are reviewed at length with the patient today.  Concerns regarding medicines are outlined above.   Tests Ordered: No orders of the defined types were placed in this encounter.   Medication Changes: No orders of the defined types were placed in this encounter.   Follow Up:  Virtual Visit or In Person in 1 year(s)  Signed, Daune Perch, NP  08/08/2019 11:55 AM    [place]

## 2019-08-08 NOTE — Patient Instructions (Addendum)
Medication Instructions:  Your physician recommends that you continue on your current medications as directed. Please refer to the Current Medication list given to you today.  If you need a refill on your cardiac medications before your next appointment, please call your pharmacy.   Lab work: None   If you have labs (blood work) drawn today and your tests are completely normal, you will receive your results only by: Marland Kitchen MyChart Message (if you have MyChart) OR . A paper copy in the mail If you have any lab test that is abnormal or we need to change your treatment, we will call you to review the results.  Testing/Procedures: None   Follow-Up: At [facility], you and your health needs are our priority.  As part of our continuing mission to provide you with exceptional heart care, we have created designated Provider Care Teams.  These Care Teams include your primary Cardiologist (physician) and Advanced Practice Providers (APPs -  Physician Assistants and Nurse Practitioners) who all work together to provide you with the care you need, when you need it. You will need a follow up appointment in:  12 months.  Please call our office 2 months in advance to schedule this appointment.  You may see Dorris Carnes, MD or one of the following Advanced Practice Providers on your designated Care Team: Richardson Dopp, PA-C [place], [place] . Daune Perch, NP  Any Other Special Instructions Will Be Listed Below (If Applicable).   Lifestyle Modifications to Prevent and Treat Heart Disease -Recommend heart healthy/Mediterranean diet, with whole grains, fruits, vegetables, fish, lean meats, nuts, olive oil and avocado oil.  -Limit salt intake to less than 1500 mg per day.  -Recommend moderate walking, starting slowly with a few minutes and working up to 3-5 times/week for 30-50 minutes each session. Aim for at least 150 minutes.week. Goal should be pace of 3 miles/hours, or walking 1.5 miles in 30  minutes -Recommend avoidance of tobacco products. Avoid excess alcohol. -Keep blood pressure well controlled, ideally less than 130/80.     DASH Eating Plan DASH stands for "Dietary Approaches to Stop Hypertension." The DASH eating plan is a healthy eating plan that has been shown to reduce high blood pressure (hypertension). It may also reduce your risk for type 2 diabetes, heart disease, and stroke. The DASH eating plan may also help with weight loss. What are tips for following this plan?  General guidelines  Avoid eating more than 2,300 mg (milligrams) of salt (sodium) a day. If you have hypertension, you may need to reduce your sodium intake to 1,500 mg a day.  Limit alcohol intake to no more than 1 drink a day for nonpregnant women and 2 drinks a day for men. One drink equals 12 oz of beer, 5 oz of wine, or 1 oz of hard liquor.  Work with your health care provider to maintain a healthy body weight or to lose weight. Ask what an ideal weight is for you.  Get at least 30 minutes of exercise that causes your heart to beat faster (aerobic exercise) most days of the week. Activities may include walking, swimming, or biking.  Work with your health care provider or diet and nutrition specialist (dietitian) to adjust your eating plan to your individual calorie needs. Reading food labels   Check food labels for the amount of sodium per serving. Choose foods with less than 5 percent of the Daily Value of sodium. Generally, foods with less than 300 mg of sodium  per serving fit into this eating plan.  To find whole grains, look for the word "whole" as the first word in the ingredient list. Shopping  Buy products labeled as "low-sodium" or "no salt added."  Buy fresh foods. Avoid canned foods and premade or frozen meals. Cooking  Avoid adding salt when cooking. Use salt-free seasonings or herbs instead of table salt or sea salt. Check with your health care provider or pharmacist before  using salt substitutes.  Do not fry foods. Cook foods using healthy methods such as baking, boiling, grilling, and broiling instead.  Cook with heart-healthy oils, such as olive, canola, soybean, or sunflower oil. Meal planning  Eat a balanced diet that includes: ? 5 or more servings of fruits and vegetables each day. At each meal, try to fill half of your plate with fruits and vegetables. ? Up to 6-8 servings of whole grains each day. ? Less than 6 oz of lean meat, poultry, or fish each day. A 3-oz serving of meat is about the same size as a deck of cards. One egg equals 1 oz. ? 2 servings of low-fat dairy each day. ? A serving of nuts, seeds, or beans 5 times each week. ? Heart-healthy fats. Healthy fats called Omega-3 fatty acids are found in foods such as flaxseeds and coldwater fish, like sardines, salmon, and mackerel.  Limit how much you eat of the following: ? Canned or prepackaged foods. ? Food that is high in trans fat, such as fried foods. ? Food that is high in saturated fat, such as fatty meat. ? Sweets, desserts, sugary drinks, and other foods with added sugar. ? Full-fat dairy products.  Do not salt foods before eating.  Try to eat at least 2 vegetarian meals each week.  Eat more home-cooked food and less restaurant, buffet, and fast food.  When eating at a restaurant, ask that your food be prepared with less salt or no salt, if possible. What foods are recommended? The items listed may not be a complete list. Talk with your dietitian about what dietary choices are best for you. Grains Whole-grain or whole-wheat bread. Whole-grain or whole-wheat pasta. Brown rice. Modena Morrow. Bulgur. Whole-grain and low-sodium cereals. Pita bread. Low-fat, low-sodium crackers. Whole-wheat flour tortillas. Vegetables Fresh or frozen vegetables (raw, steamed, roasted, or grilled). Low-sodium or reduced-sodium tomato and vegetable juice. Low-sodium or reduced-sodium tomato sauce and  tomato paste. Low-sodium or reduced-sodium canned vegetables. Fruits All fresh, dried, or frozen fruit. Canned fruit in natural juice (without added sugar). Meat and other protein foods Skinless chicken or [place]. Ground chicken or [place]. Pork with fat trimmed off. Fish and seafood. Egg whites. Dried beans, peas, or lentils. Unsalted nuts, nut butters, and seeds. Unsalted canned beans. Lean cuts of beef with fat trimmed off. Low-sodium, lean deli meat. Dairy Low-fat (1%) or fat-free (skim) milk. Fat-free, low-fat, or reduced-fat cheeses. Nonfat, low-sodium ricotta or cottage cheese. Low-fat or nonfat yogurt. Low-fat, low-sodium cheese. Fats and oils Soft margarine without trans fats. Vegetable oil. Low-fat, reduced-fat, or light mayonnaise and salad dressings (reduced-sodium). Canola, safflower, olive, soybean, and sunflower oils. Avocado. Seasoning and other foods Herbs. Spices. Seasoning mixes without salt. Unsalted popcorn and pretzels. Fat-free sweets. What foods are not recommended? The items listed may not be a complete list. Talk with your dietitian about what dietary choices are best for you. Grains Baked goods made with fat, such as croissants, muffins, or some breads. Dry pasta or rice meal packs. Vegetables Creamed or fried vegetables. Vegetables  in a cheese sauce. Regular canned vegetables (not low-sodium or reduced-sodium). Regular canned tomato sauce and paste (not low-sodium or reduced-sodium). Regular tomato and vegetable juice (not low-sodium or reduced-sodium). Angie Fava. Olives. Fruits Canned fruit in a light or heavy syrup. Fried fruit. Fruit in cream or butter sauce. Meat and other protein foods Fatty cuts of meat. Ribs. Fried meat. Berniece Salines. Sausage. Bologna and other processed lunch meats. Salami. Fatback. Hotdogs. Bratwurst. Salted nuts and seeds. Canned beans with added salt. Canned or smoked fish. Whole eggs or egg yolks. Chicken or [place] with skin. Dairy Whole or 2%  milk, cream, and half-and-half. Whole or full-fat cream cheese. Whole-fat or sweetened yogurt. Full-fat cheese. Nondairy creamers. Whipped toppings. Processed cheese and cheese spreads. Fats and oils Butter. Stick margarine. Lard. Shortening. Ghee. Bacon fat. Tropical oils, such as coconut, palm kernel, or palm oil. Seasoning and other foods Salted popcorn and pretzels. Onion salt, garlic salt, seasoned salt, table salt, and sea salt. Worcestershire sauce. Tartar sauce. Barbecue sauce. Teriyaki sauce. Soy sauce, including reduced-sodium. Steak sauce. Canned and packaged gravies. Fish sauce. Oyster sauce. Cocktail sauce. Horseradish that you find on the shelf. Ketchup. Mustard. Meat flavorings and tenderizers. Bouillon cubes. Hot sauce and Tabasco sauce. Premade or packaged marinades. Premade or packaged taco seasonings. Relishes. Regular salad dressings. Where to find more information:  National Heart, Lung, and [place]: https://wilson-eaton.com/  American Heart Association: www.heart.org Summary  The DASH eating plan is a healthy eating plan that has been shown to reduce high blood pressure (hypertension). It may also reduce your risk for type 2 diabetes, heart disease, and stroke.  With the DASH eating plan, you should limit salt (sodium) intake to 2,300 mg a day. If you have hypertension, you may need to reduce your sodium intake to 1,500 mg a day.  When on the DASH eating plan, aim to eat more fresh fruits and vegetables, whole grains, lean proteins, low-fat dairy, and heart-healthy fats.  Work with your health care provider or diet and nutrition specialist (dietitian) to adjust your eating plan to your individual calorie needs. This information is not intended to replace advice given to you by your health care provider. Make sure you discuss any questions you have with your health care provider. Document Released: 11/25/2011 Document Revised: 11/18/2017 Document Reviewed:  11/29/2016 Elsevier Patient Education  2020 [employer].

## 2019-08-12 ENCOUNTER — Other Ambulatory Visit: Payer: Self-pay | Admitting: Family Medicine

## 2019-08-13 ENCOUNTER — Other Ambulatory Visit: Payer: Self-pay

## 2019-08-13 ENCOUNTER — Telehealth (INDEPENDENT_AMBULATORY_CARE_PROVIDER_SITE_OTHER): Payer: Medicare Other | Admitting: Family Medicine

## 2019-08-13 DIAGNOSIS — F419 Anxiety disorder, unspecified: Secondary | ICD-10-CM

## 2019-08-13 NOTE — Progress Notes (Signed)
[facility] Telemedicine Visit  Patient consented to have virtual visit. Method of visit: Telephone  Encounter participants: Patient: Rebecca Walter - located at home Provider: Kathrene Alu - located at [facility] Others (if applicable): none  Chief Complaint: f/u anxiety  HPI:  Patient reports that her anxiety is under control, and she is doing well on current Zoloft prescription Is applying for the [employer] bus and will send me the paperwork soon.  [employer] fax number: [phone] Continues to think about whether to have eye surgery to help dry, scratchy left eye, may have some risk of worsening vision afterward Would like to continue receiving weekly phone calls to check in  ROS: per HPI  Pertinent PMHx: Type 2 diabetes, anxiety, CHF, hypertension  Exam:  Respiratory: speaks in complete sentences without shortness of breath  Assessment/Plan:  Anxiety Currently well controlled.  We will continue Zoloft 100 mg each morning and 50 mg each afternoon.  We will plan to call patient weekly even if I cannot schedule an appointment due to being on inpatient during the next month, since this seems to help Rebecca Walter to control her anxiety.  Discussed she should take her time in making this decision about the eye surgery and to ask both her eye doctor and the cornea specialist any other questions she can think of in order to make her decision.    Time spent during visit with patient: 12 minutes

## 2019-08-14 NOTE — Assessment & Plan Note (Signed)
Currently well controlled.  We will continue Zoloft 100 mg each morning and 50 mg each afternoon.  We will plan to call patient weekly even if I cannot schedule an appointment due to being on inpatient during the next month, since this seems to help Rebecca Walter to control her anxiety.  Discussed she should take her time in making this decision about the eye surgery and to ask both her eye doctor and the cornea specialist any other questions she can think of in order to make her decision.

## 2019-08-18 ENCOUNTER — Other Ambulatory Visit: Payer: Self-pay | Admitting: Family Medicine

## 2019-08-20 ENCOUNTER — Telehealth: Payer: Self-pay | Admitting: Family Medicine

## 2019-08-20 NOTE — Telephone Encounter (Signed)
Patients son came into office to drop off pt's [employer] forms for transportation. Pt's last apt in office was on 07-05-19, and forms were placed in white team folder.

## 2019-08-21 ENCOUNTER — Other Ambulatory Visit: Payer: Self-pay

## 2019-08-22 NOTE — Telephone Encounter (Signed)
Clinical info completed on [employer] form.  Place form in Dr. Maudie Flakes box for completion.  Ottis Stain, CMA

## 2019-08-23 ENCOUNTER — Telehealth: Payer: Self-pay | Admitting: Family Medicine

## 2019-08-23 NOTE — Telephone Encounter (Signed)
Pt received a letter in the mail informing her that Metform has been recalled. It did have lot numbers but she is not sure how to tell if this affects her. jw

## 2019-08-23 NOTE — Telephone Encounter (Signed)
Patient contacted RE Metformin recall letter.   She states letter was from [facility].  We agreed she would contact her local pharmacy and inquire if they would "replace" her supply of Metformin.  She accepted that plan.    Her self reported blood glucose readings appear to be at goal. (low 100s).  Denies hypoglycemia.   Also reports she is doing much better with anxiety since starting the sertraline. She reports tolerating the higher dose very well.

## 2019-08-28 ENCOUNTER — Other Ambulatory Visit: Payer: Self-pay | Admitting: Family Medicine

## 2019-08-29 ENCOUNTER — Other Ambulatory Visit: Payer: Self-pay | Admitting: Family Medicine

## 2019-08-29 ENCOUNTER — Telehealth: Payer: Self-pay | Admitting: Family Medicine

## 2019-08-29 MED ORDER — SERTRALINE HCL 50 MG PO TABS: 50.0000 mg | ORAL_TABLET | Freq: Two times a day (BID) | ORAL | 3 refills | Status: DC

## 2019-08-29 MED ORDER — GABAPENTIN 100 MG PO CAPS: 100.0000 mg | ORAL_CAPSULE | Freq: Every day | ORAL | 0 refills | Status: DC

## 2019-08-29 NOTE — Telephone Encounter (Signed)
Rebecca Walter calls to discuss the numbness in her hands and legs but she experienced 2 days ago.  She says that this is slowly getting better.  CBGs have ranged from 110-190.  Her weight was up to 190 pounds, so she took 1 dose of torsemide 20 mg, which is higher than her normal dose of torsemide 10 mg daily.  Her weight has returned to his dry weight at 187 pounds.  She is interested in something to help the numbness she is feeling.  She has never tried gabapentin before and is willing to try this medication.  I advised that this medication can make her sleepy, so we are starting her on the lowest dose, and she should take it nightly if needed.  She also would like to add sertraline 50 mg nightly in addition to sertraline 100 mg in the morning and 50 mg in the afternoon.  I advised the patient that it likely would be easier to do 200 mg daily or 100 mg twice daily since this medication is long-acting, but she requests the split dosing she described, which is fine with me.  Told her that the highest dose for Zoloft would be 250 mg daily, so she is aware that we cannot go to up to much more on this medication.  We will send gabapentin 100 mg 30 tabs and Zoloft 50 mg to her pharmacy.  Amanda C. Shan Levans, MD PGY-3, [facility] 08/29/2019 2:33 PM

## 2019-08-29 NOTE — Telephone Encounter (Signed)
Patient would like [name] to call her to discuss her sugars. Please advise.

## 2019-08-31 NOTE — Telephone Encounter (Signed)
Form placed in to be faxed pile.  Lenka Zhao,CMA

## 2019-08-31 NOTE — Telephone Encounter (Signed)
[employer] form completed and placed in RN folder.  Patient would like the form faxed.  Thanks.

## 2019-09-04 ENCOUNTER — Other Ambulatory Visit: Payer: Self-pay | Admitting: Family Medicine

## 2019-09-08 ENCOUNTER — Other Ambulatory Visit: Payer: Self-pay | Admitting: Family Medicine

## 2019-09-10 ENCOUNTER — Telehealth (INDEPENDENT_AMBULATORY_CARE_PROVIDER_SITE_OTHER): Payer: Medicare Other | Admitting: Family Medicine

## 2019-09-10 ENCOUNTER — Other Ambulatory Visit: Payer: Self-pay

## 2019-09-10 DIAGNOSIS — F419 Anxiety disorder, unspecified: Secondary | ICD-10-CM | POA: Diagnosis not present

## 2019-09-10 NOTE — Assessment & Plan Note (Addendum)
Patient continues to do well on Zoloft 200 mg total per day in divided doses as well as BuSpar and meeting weekly with me over the telephone.  We will continue this plan.  I have made an appointment for her to have her next virtual visit with me on October 8, and I will plan to call her outside of clinic next week.

## 2019-09-10 NOTE — Progress Notes (Signed)
[facility] Telemedicine Visit  Patient consented to have virtual visit. Method of visit: Telephone  Encounter participants: Patient: Rebecca Walter - located at home Provider: Kathrene Alu - located at [facility] Others (if applicable): none  Chief Complaint: anxiety follow up  HPI:  Ms. Hains reports that she is feeling well overall and that her anxiety is well controlled.  She has been staying busy and recently had her 78th birthday, where she got to see many several members of her family.  She is also tolerating the gabapentin 100 mg nightly nightly and thinks it may be helping with some of her leg pain.  She would like to continue our weekly virtual visits.  ROS: per HPI  Pertinent PMHx: Anxiety, type 2 diabetes, peripheral neuropathy  Exam:  Respiratory: Able to speak in full sentences without shortness of breath  Assessment/Plan:  Anxiety Patient continues to do well on Zoloft 200 mg total per day in divided doses as well as BuSpar and meeting weekly with me over the telephone.  We will continue this plan.  I have made an appointment for her to have her next virtual visit with me on October 8, and I will plan to call her outside of clinic next week.    Time spent during visit with patient: 12 minutes

## 2019-09-11 ENCOUNTER — Encounter: Payer: Self-pay | Admitting: Podiatry

## 2019-09-11 ENCOUNTER — Ambulatory Visit (INDEPENDENT_AMBULATORY_CARE_PROVIDER_SITE_OTHER): Payer: Medicare Other | Admitting: Podiatry

## 2019-09-11 ENCOUNTER — Other Ambulatory Visit: Payer: Self-pay

## 2019-09-11 DIAGNOSIS — M79675 Pain in left toe(s): Secondary | ICD-10-CM | POA: Diagnosis not present

## 2019-09-11 DIAGNOSIS — M79674 Pain in right toe(s): Secondary | ICD-10-CM

## 2019-09-11 DIAGNOSIS — B351 Tinea unguium: Secondary | ICD-10-CM | POA: Diagnosis not present

## 2019-09-11 NOTE — Patient Instructions (Signed)
Diabetic Neuropathy Diabetic neuropathy refers to nerve damage that is caused by diabetes (diabetes mellitus). Over time, people with diabetes can develop nerve damage throughout the body. There are several types of diabetic neuropathy:  Peripheral neuropathy. This is the most common type of diabetic neuropathy. It causes damage to nerves that carry signals between the spinal cord and other parts of the body (peripheral nerves). This usually affects nerves in the feet and legs first, and may eventually affect the hands and arms. The damage affects the ability to sense touch or temperature.  Autonomic neuropathy. This type causes damage to nerves that control involuntary functions (autonomic nerves). These nerves carry signals that control: ? Heartbeat. ? Body temperature. ? Blood pressure. ? Urination. ? Digestion. ? Sweating. ? Sexual function. ? Response to changing blood sugar (glucose) levels.  Focal neuropathy. This type of nerve damage affects one area of the body, such as an arm, a leg, or the face. The injury may involve one nerve or a small group of nerves. Focal neuropathy can be painful and unpredictable, and occurs most often in older adults with diabetes. This often develops suddenly, but usually improves over time and does not cause long-term problems.  Proximal neuropathy. This type of nerve damage affects the nerves of the thighs, hips, buttocks, or legs. It causes severe pain, weakness, and muscle death (atrophy), usually in the thigh muscles. It is more common among older men and people who have type 2 diabetes. The length of recovery time may vary. What are the causes? Peripheral, autonomic, and focal neuropathies are caused by diabetes that is not well controlled with treatment. The cause of proximal neuropathy is not known, but it may be caused by inflammation related to uncontrolled blood glucose levels. What are the signs or symptoms? Peripheral neuropathy Peripheral  neuropathy develops slowly over time. When the nerves of the feet and legs no longer work, you may experience:  Burning, stabbing, or aching pain in the legs or feet.  Pain or cramping in the legs or feet.  Loss of feeling (numbness) and inability to feel pressure or pain in the feet. This can lead to: ? Thick calluses or sores on areas of constant pressure. ? Ulcers. ? Reduced ability to feel temperature changes.  Foot deformities.  Muscle weakness.  Loss of balance or coordination. Autonomic neuropathy The symptoms of autonomic neuropathy vary depending on which nerves are affected. Symptoms may include:  Problems with digestion, such as: ? Nausea or vomiting. ? Poor appetite. ? Bloating. ? Diarrhea or constipation. ? Trouble swallowing. ? Losing weight without trying to.  Problems with the heart, blood and lungs, such as: ? Dizziness, especially when standing up. ? Fainting. ? Shortness of breath. ? Irregular heartbeat.  Bladder problems, such as: ? Trouble starting or stopping urination. ? Leaking urine. ? Trouble emptying the bladder. ? Urinary tract infections (UTIs).  Problems with other body functions, such as: ? Sweat. You may sweat too much or too little. ? Temperature. You might get hot easily. Or, you might feel cold more than usual. ? Sexual function. Men may not be able to get or maintain an erection. Women may have vaginal dryness and difficulty with arousal. Focal neuropathy Symptoms affect only one area of the body. Common symptoms include:  Numbness.  Tingling.  Burning pain.  Prickling feeling.  Very sensitive skin.  Weakness.  Inability to move (paralysis).  Muscle twitching.  Muscles getting smaller (wasting).  Poor coordination.  Double or blurred vision. Proximal   neuropathy  Sudden, severe pain in the hip, thigh, or buttocks. Pain may spread from the back into the legs (sciatica).  Pain and numbness in the arms and legs.   Tingling.  Loss of bladder control or bowel control.  Weakness and wasting of thigh muscles.  Difficulty getting up from a seated position.  Abdominal swelling.  Unexplained weight loss. How is this diagnosed? Diagnosis usually involves reviewing your medical history and any symptoms you have. Diagnosis varies depending on the type of neuropathy your health care provider suspects. Peripheral neuropathy Your health care provider will check areas that are affected by your nervous system (neurologic exam), such as your reflexes, how you move, and what you can feel. You may have other tests, such as:  Blood tests.  Removal and examination of fluid that surrounds the spinal cord (lumbar puncture).  CT scan.  MRI.  A test to check the nerves that control muscles (electromyogram, EMG).  Tests of how quickly messages pass through your nerves (nerve conduction velocity tests).  Removal of a small piece of nerve to be examined under a microscope (biopsy). Autonomic neuropathy You may have tests, such as:  Tests to measure your blood pressure and heart rate. This may include monitoring you while you are safely secured to an exam table that moves you from a lying position to an upright position (table tilt test).  Breathing tests to check your lungs.  Tests to check how food moves through the digestive system (gastric emptying tests).  Blood, sweat, or urine tests.  Ultrasound of your bladder.  Spinal fluid tests. Focal neuropathy This condition may be diagnosed with:  A neurologic exam.  CT scan.  MRI.  EMG.  Nerve conduction velocity tests. Proximal neuropathy There is no test to diagnose this type of neuropathy. You may have tests to rule out other possible causes of this type of neuropathy. Tests may include:  X-rays of your spine and lumbar region.  Lumbar puncture.  MRI. How is this treated? The goal of treatment is to keep nerve damage from getting worse.  The most important part of treatment is keeping your blood glucose level and your A1C level within your target range by following your diabetes management plan. Over time, maintaining lower blood glucose levels helps lessen symptoms. In some cases, you may need prescription pain medicine. Follow these instructions at home:  Lifestyle   Do not use any products that contain nicotine or tobacco, such as cigarettes and e-cigarettes. If you need help quitting, ask your health care provider.  Be physically active every day. Include strength training and balance exercises.  Follow a healthy meal plan.  Work with your health care provider to manage your blood pressure. General instructions  Follow your diabetes management plan as directed. ? Check your blood glucose levels as directed by your health care provider. ? Keep your blood glucose in your target range as directed by your health care provider. ? Have your A1C level checked at least two times a year, or as often as told by your health care provider.  Take over the counter and prescription medicines only as told by your health care provider. This includes insulin and diabetes medicine.  Do not drive or use heavy machinery while taking prescription pain medicines.  Check your skin and feet every day for cuts, bruises, redness, blisters, or sores.  Keep all follow up visits as told by your health care provider. This is important. Contact a health care provider if:    You have burning, stabbing, or aching pain in your legs or feet.  You are unable to feel pressure or pain in your feet.  You develop problems with digestion, such as: ? Nausea. ? Vomiting. ? Bloating. ? Constipation. ? Diarrhea. ? Abdominal pain.  You have difficulty with urination, such as inability: ? To control when you urinate (incontinence). ? To completely empty the bladder (retention).  You have palpitations.  You feel dizzy, weak, or faint when you stand  up. Get help right away if:  You cannot urinate.  You have sudden weakness or loss of coordination.  You have trouble speaking.  You have pain or pressure in your chest.  You have an irregular heart beat.  You have sudden inability to move a part of your body. Summary  Diabetic neuropathy refers to nerve damage that is caused by diabetes. It can affect nerves throughout the entire body, causing numbness and pain in the arms, legs, digestive tract, heart, and other body systems.  Keep your blood glucose level and your blood pressure in your target range, as directed by your health care provider. This can help prevent neuropathy from getting worse.  Check your skin and feet every day for cuts, bruises, redness, blisters, or sores.  Do not use any products that contain nicotine or tobacco, such as cigarettes and e-cigarettes. If you need help quitting, ask your health care provider. This information is not intended to replace advice given to you by your health care provider. Make sure you discuss any questions you have with your health care provider. Document Released: 02/14/2002 Document Revised: 01/18/2018 Document Reviewed: 01/10/2017 Elsevier Patient Education  2020 [employer]  Onychomycosis/Fungal Toenails  WHAT IS IT? An infection that lies within the keratin of your nail plate that is caused by a fungus.  WHY ME? Fungal infections affect all ages, sexes, races, and creeds.  There may be many factors that predispose you to a fungal infection such as age, coexisting medical conditions such as diabetes, or an autoimmune disease; stress, medications, fatigue, genetics, etc.  Bottom line: fungus thrives in a warm, moist environment and your shoes offer such a location.  IS IT CONTAGIOUS? Theoretically, yes.  You do not want to share shoes, nail clippers or files with someone who has fungal toenails.  Walking around barefoot in the same room or sleeping in the same bed is unlikely  to transfer the organism.  It is important to realize, however, that fungus can spread easily from one nail to the next on the same foot.  HOW DO WE TREAT THIS?  There are several ways to treat this condition.  Treatment may depend on many factors such as age, medications, pregnancy, liver and kidney conditions, etc.  It is best to ask your doctor which options are available to you.  1. No treatment.   Unlike many other medical concerns, you can live with this condition.  However for many people this can be a painful condition and may lead to ingrown toenails or a bacterial infection.  It is recommended that you keep the nails cut short to help reduce the amount of fungal nail. 2. Topical treatment.  These range from herbal remedies to prescription strength nail lacquers.  About 40-50% effective, topicals require twice daily application for approximately 9 to 12 months or until an entirely new nail has grown out.  The most effective topicals are medical grade medications available through physicians offices. 3. Oral antifungal medications.  With an 80-90% cure   rate, the most common oral medication requires 3 to 4 months of therapy and stays in your system for a year as the new nail grows out.  Oral antifungal medications do require blood work to make sure it is a safe drug for you.  A liver function panel will be performed prior to starting the medication and after the first month of treatment.  It is important to have the blood work performed to avoid any harmful side effects.  In general, this medication safe but blood work is required. 4. Laser Therapy.  This treatment is performed by applying a specialized laser to the affected nail plate.  This therapy is noninvasive, fast, and non-painful.  It is not covered by insurance and is therefore, out of pocket.  The results have been very good with a 80-95% cure rate.  [facility] is the only practice in the area to offer this therapy. 5. Permanent  Nail Avulsion.  Removing the entire nail so that a new nail will not grow back. 

## 2019-09-16 NOTE — Progress Notes (Signed)
Subjective: Rebecca Walter is seen today for follow up painful, elongated, thickened toenails 1-5 b/l feet that she cannot cut. Pain interferes with daily activities. Aggravating factor includes wearing enclosed shoe gear and relieved with periodic debridement.  Current Outpatient Medications on File Prior to Visit  Medication Sig  . acetaminophen (TYLENOL) 325 MG tablet Take 325 mg by mouth every 8 (eight) hours as needed (pain).   Marland Kitchen allopurinol (ZYLOPRIM) 100 MG tablet TAKE 2 TABLETS BY MOUTH EVERY DAY  . amLODipine (NORVASC) 10 MG tablet TAKE 1 TABLET BY MOUTH EVERY DAY  . amLODipine (NORVASC) 2.5 MG tablet Take 2.5 mg by mouth 2 (two) times daily.  Marland Kitchen amLODipine (NORVASC) 5 MG tablet TAKE 1 TABLET BY MOUTH EVERYDAY AT BEDTIME  . aspirin 81 MG chewable tablet Chew 1 tablet (81 mg total) by mouth daily.  . baclofen (LIORESAL) 10 MG tablet Take 10 mg by mouth 2 (two) times daily.  . Blood Glucose Monitoring Suppl (ACCU-CHEK AVIVA PLUS) w/Device KIT 1 Device by Does not apply route daily.  . busPIRone (BUSPAR) 15 MG tablet TAKE 1 TABLET (15 MG TOTAL) BY MOUTH 2 (TWO) TIMES DAILY.  Marland Kitchen Camphor-Eucalyptus-Menthol (VICKS VAPORUB EX) Place 1 application into both nostrils daily as needed (congestion).  . famotidine (PEPCID) 20 MG tablet Take 20 mg by mouth daily.  Marland Kitchen gabapentin (NEURONTIN) 100 MG capsule Take 1 capsule (100 mg total) by mouth at bedtime.  Marland Kitchen glucose blood (ACCU-CHEK AVIVA PLUS) test strip CHECK BLOOD SUGAR FIRST  THING IN THE MORNING BEFORE EATING, AFTER LUNCH, AND  AFTER DINNER TOTAL 3 TIMES  DAILY  . insulin aspart (NOVOLOG FLEXPEN) 100 UNIT/ML FlexPen Inject 9-18 Units into the skin See admin instructions. Inject 15 units subcutaneously daily with breakfast, inject 9 units with lunch as needed for CBG 180 or more, and inject 9 units with supper  . Insulin Pen Needle (B-D ULTRAFINE III SHORT PEN) 31G X 8 MM MISC Use to inject insulin 4 times daily or as directed.  Marland Kitchen LANTUS SOLOSTAR 100  UNIT/ML Solostar Pen INJECT 55 UNITS INTO THE SKIN DAILY. IN THE MORNING (Patient taking differently: 65 Units. )  . loratadine (CLARITIN) 10 MG tablet Take 10 mg by mouth daily.  Marland Kitchen lovastatin (MEVACOR) 40 MG tablet Take 1 tablet (40 mg total) by mouth daily with supper.  . metFORMIN (GLUCOPHAGE-XR) 500 MG 24 hr tablet TAKE 1 TABLET TWICE A DAY BEFORE MEALS  . Multiple Vitamin (MULTIVITAMIN WITH MINERALS) TABS tablet Take 1 tablet by mouth daily.  . pantoprazole (PROTONIX) 40 MG tablet TAKE 1 TABLET BY MOUTH TWICE A DAY  . sertraline (ZOLOFT) 100 MG tablet Take 1 tablet (100 mg total) by mouth daily.  . sertraline (ZOLOFT) 50 MG tablet Take 1 tablet (50 mg total) by mouth 2 (two) times daily. One dose in the afternoon and a second before bed.  . spironolactone (ALDACTONE) 25 MG tablet TAKE 1 TABLET BY MOUTH EVERY DAY  . torsemide (DEMADEX) 10 MG tablet TAKE 1 TABLET BY MOUTH EVERY DAY AS NEEDED  . traZODone (DESYREL) 50 MG tablet Take 1 tablet (50 mg total) by mouth at bedtime.  . valsartan (DIOVAN) 320 MG tablet Take 1 tablet (320 mg total) by mouth daily.  . [DISCONTINUED] amLODipine (NORVASC) 2.5 MG tablet Take 1 tablet (2.5 mg total) by mouth 2 (two) times a day. (Patient taking differently: Take 10 mg by mouth daily. Extra 2.5 mg each afternoon per pt preference)  . [DISCONTINUED] busPIRone (BUSPAR) 15 MG  tablet Take 1 tablet (15 mg total) by mouth 2 (two) times daily.   No current facility-administered medications on file prior to visit.      Allergies  Allergen Reactions  . Baclofen Other (See Comments)    Caused leg weakness  . Hctz [Hydrochlorothiazide] Other (See Comments)    Uric acid elevation and gout.     . Lotensin [Benazepril] Other (See Comments)    Dry cough   . Victoza [Liraglutide] Other (See Comments)    Tongue Glossitus  . Acyclovir And Related Diarrhea  . Beta Adrenergic Blockers Other (See Comments)    Occurred with metoprolol  REACTION: coughing  . Losartan  Diarrhea  . Other Diarrhea and Other (See Comments)    Occurred with metoprolol  REACTION: coughing     Objective:  Vascular Examination: Capillary refill time immediate x 10 digits.  Dorsalis pedis present b/l.  Posterior tibial pulses present b/l.  Digital hair present x 10 digits.  Skin temperature gradient WNL b/l.   Dermatological Examination: Skin with normal turgor, texture and tone b/l.  Toenails 1-5 b/l discolored, thick, dystrophic with subungual debris and pain with palpation to nailbeds due to thickness of nails.   Incurvated nailplate b/l great toes with tenderness to palpation. No erythema, no edema, no drainage noted.  Musculoskeletal: Muscle strength 5/5 to all LE muscle groups b/l.  No gross bony deformities b/l.  No pain, crepitus or joint limitation noted with ROM.   Neurological Examination: Protective sensation intact  5/5 with 10 gram monofilament bilaterally.  Epicritic sensation present bilaterally.  Vibratory sensation intact bilaterally.   Assessment: Painful onychomycosis toenails 1-5 b/l  Ingrown toenails b/l hallux, noninfected  Plan: 1. Toenails 1-5 b/l were debrided in length and girth without iatrogenic bleeding. Offending nail borders debrided and curretaged b/l great toes. Borders cleansed with alcohol. Antibiotic ointment applied. Ms Brubeck instructed to apply antibiotic ointment to both great toes once daily for one week. Call if she has any problems. 2. Patient to continue soft, supportive shoe gear daily. 3. Patient to report any pedal injuries to medical professional immediately. 4. Follow up 3 months.  5. Patient/POA to call should there be a concern in the interim.

## 2019-09-18 ENCOUNTER — Other Ambulatory Visit: Payer: Self-pay | Admitting: Family Medicine

## 2019-09-19 ENCOUNTER — Other Ambulatory Visit: Payer: Self-pay | Admitting: Family Medicine

## 2019-09-21 ENCOUNTER — Telehealth: Payer: Self-pay | Admitting: Family Medicine

## 2019-09-21 NOTE — Telephone Encounter (Signed)
Patient came into office to drop off her [employer] forms to be completed by her PCP. Patients last apt was on 09-10-19, forms were placed in white team folder.

## 2019-09-21 NOTE — Telephone Encounter (Signed)
Called Ms. Lurie to have her weekly check-in.  She says that she is doing well overall.  Her anxiety continues to be well controlled.  She had a mild burn from the oven but this is healing well.  She has also had some weakness and fatigue after she has walked for a while, so she is going to work on walking more and walking with her walker to provide extra safety.  I encouraged her to do this.  We will continue our weekly check-in since these seem to be working well for Ms. Dahlem.

## 2019-09-24 ENCOUNTER — Other Ambulatory Visit: Payer: Self-pay | Admitting: Family Medicine

## 2019-09-25 NOTE — Telephone Encounter (Signed)
Clinical info completed on [employer] form.  Place form in Dr. Maudie Flakes box for completion.  Rebecca Walter, CMA

## 2019-09-27 ENCOUNTER — Telehealth (INDEPENDENT_AMBULATORY_CARE_PROVIDER_SITE_OTHER): Payer: Medicare Other | Admitting: Family Medicine

## 2019-09-27 ENCOUNTER — Other Ambulatory Visit: Payer: Self-pay

## 2019-09-27 DIAGNOSIS — F419 Anxiety disorder, unspecified: Secondary | ICD-10-CM

## 2019-09-27 NOTE — Progress Notes (Signed)
[place] Telemedicine Visit  Patient consented to have virtual visit. Method of visit: Telephone  Encounter participants: Patient: Rebecca Walter - located at home Provider: Kathrene Alu - located at [facility] Others (if applicable): None  Chief Complaint: Follow-up of anxiety  HPI:  Called Rebecca Walter for our weekly check in.  She says that her anxiety is very well controlled, and she has no other concerns.  She has been going through her house and deep cleaning this week, which has kept her very busy.  She says that she has been able to tolerate this increase in activity and has enjoyed it.  She is resting today but thinks that she will continue her project tomorrow.  She would like to continue having virtual visits weekly since these have worked very well for her.  She does say that she stopped taking the gabapentin because it made her feel "off" but has no other medication needs currently.  ROS: per HPI  Pertinent PMHx: Anxiety, type 2 diabetes, hypertension  Exam:  Respiratory: No shortness of breath during our conversation  Assessment/Plan:  Anxiety Continues to be well controlled on buspirone and Zoloft.  We will continue these medications.  An appointment was made for next week to continue checking and weekly since this seems to work well for Rebecca Walter.  Gabapentin was removed from her medication list.    Time spent during visit with patient: 15 minutes

## 2019-09-27 NOTE — Assessment & Plan Note (Signed)
Continues to be well controlled on buspirone and Zoloft.  We will continue these medications.  An appointment was made for next week to continue checking and weekly since this seems to work well for Rebecca Walter.  Gabapentin was removed from her medication list.

## 2019-09-28 ENCOUNTER — Other Ambulatory Visit: Payer: Self-pay | Admitting: *Deleted

## 2019-09-28 DIAGNOSIS — I10 Essential (primary) hypertension: Secondary | ICD-10-CM

## 2019-09-28 MED ORDER — SPIRONOLACTONE 25 MG PO TABS: 25.0000 mg | ORAL_TABLET | Freq: Every day | ORAL | 3 refills | Status: DC

## 2019-09-28 NOTE — Telephone Encounter (Signed)
Completed [employer] form and placed in nurse folder.

## 2019-09-28 NOTE — Telephone Encounter (Signed)
Faxed form to appropriate number.

## 2019-10-03 ENCOUNTER — Telehealth (INDEPENDENT_AMBULATORY_CARE_PROVIDER_SITE_OTHER): Payer: Medicare Other | Admitting: Family Medicine

## 2019-10-03 ENCOUNTER — Other Ambulatory Visit: Payer: Self-pay

## 2019-10-03 ENCOUNTER — Other Ambulatory Visit: Payer: Self-pay | Admitting: Family Medicine

## 2019-10-03 ENCOUNTER — Encounter: Payer: Self-pay | Admitting: Family Medicine

## 2019-10-03 DIAGNOSIS — R29898 Other symptoms and signs involving the musculoskeletal system: Secondary | ICD-10-CM

## 2019-10-03 NOTE — Progress Notes (Signed)
[place] Telemedicine Visit  Patient consented to have virtual visit. Method of visit: Telephone  Encounter participants: Patient: Rebecca Walter - located at home Provider: Wilber Oliphant - located at [facility] Others (if applicable): none  Chief Complaint: weakness in lower legs   HPI: At around 11:30 AM yesterday, patient reports that she was getting up and rushing to the door to answer the door for the [employer] people and reported feeling weird.  She said that she just had a little bit of weakness in her legs but did not feel dizzy at all.  She reports that she has not been taking her torsemide for the last couple of days.  She is not having shortness of breath or chest pain.  She did not fall.  She thinks it is because she just stood up too quickly.  Patient reports that she does not have these episodes often.  ROS: per HPI  Pertinent PMHx: Anxiety CHF, osteoarthritis, IDDM, generalized weakness, history of hyponatremia  Exam:  Respiratory: No increased work of breathing, shortness of breath over the phone.  Assessment/Plan:  Leg weakness, bilateral patient reports a short episode of bilateral leg weakness that occurred yesterday after getting up quickly to answer her door. patient is not in any acute distress at this time.  She is not have any chest pain, shortness of breath, numbness, unilateral weakness.  This does not feel like an episode that she has had before.  Patient thinks that it was because she stood up too quickly and rushed to the door.  She is not concerned about any acute abnormality.  Patient reports, "I guess I could have waited to talk to Dr. Shan Levans tomorrow about this."  Patient given return precautions and instructed to call if she has any other acute complaints.  No further work-up at this time.  Episode sounds benign and patient does not have much concerned about this episode.  We will continue to monitor.  Advised patient to stand up  slowly and walk slowly or any comfortable rate.   Time spent during visit with patient: 10 minutes.   Wilber Oliphant, MD

## 2019-10-03 NOTE — Assessment & Plan Note (Addendum)
patient reports a short episode of bilateral leg weakness that occurred yesterday after getting up quickly to answer her door. patient is not in any acute distress at this time.  She is not have any chest pain, shortness of breath, numbness, unilateral weakness.  This does not feel like an episode that she has had before.  Patient thinks that it was because she stood up too quickly and rushed to the door.  She is not concerned about any acute abnormality.  Patient reports, "I guess I could have waited to talk to Dr. Shan Levans tomorrow about this."  Patient given return precautions and instructed to call if she has any other acute complaints.  No further work-up at this time.  Episode sounds benign and patient does not have much concerned about this episode.  We will continue to monitor.  Advised patient to stand up slowly and walk slowly or any comfortable rate.

## 2019-10-04 ENCOUNTER — Telehealth: Payer: Self-pay | Admitting: Family Medicine

## 2019-10-04 ENCOUNTER — Other Ambulatory Visit: Payer: Self-pay

## 2019-10-04 ENCOUNTER — Telehealth (INDEPENDENT_AMBULATORY_CARE_PROVIDER_SITE_OTHER): Payer: Medicare Other | Admitting: Family Medicine

## 2019-10-04 DIAGNOSIS — R197 Diarrhea, unspecified: Secondary | ICD-10-CM | POA: Diagnosis not present

## 2019-10-04 NOTE — Assessment & Plan Note (Signed)
Reassured patient that since her symptoms have now resolved and she has continued to have good p.o. intake and does not have cough or shortness of breath.  However, she would still like to have COVID testing, so this was ordered.  She was instructed to be in line at [facility] by 3 PM today or tomorrow to have this done.  Will continue to call her once weekly to check and even though I do not have appointments available for the next few weeks.  She is aware of this.

## 2019-10-04 NOTE — Telephone Encounter (Signed)
Patient had virtual appt today but feels she should come in for labwork soon.  Please call her to advise her about what she needs and when she can come in for this, thanks.

## 2019-10-04 NOTE — Progress Notes (Signed)
[place] Telemedicine Visit  Patient consented to have virtual visit. Method of visit: Telephone  Encounter participants: Patient: Rebecca Walter - located at home Provider: Kathrene Alu - located at [facility] Others (if applicable): None  Chief Complaint: Diarrhea, chills  HPI:  Patient reports that on Saturday of this past weekend, she developed loose and watery stools.  This went away with Imodium after 1 day, and she did not have nausea, vomiting, or loss of appetite.  However, she had intermittent chills and took her temperature several times over the past few days.  Her temperature was around 97 degrees, which made her worried that she was too cold.  She also had a bout of lower extremity weakness discussed at an earlier visit that occurred when she got up quickly to answer the door.  She is worried that her bout of diarrhea and chills could mean that she has COVID.  She denies cough and shortness of breath.  ROS: per HPI  Pertinent PMHx: Anxiety, type 2 diabetes, hypertension, diastolic heart failure  Exam:  Respiratory: No shortness of breath or cough during our conversation  Assessment/Plan:  Diarrhea Reassured patient that since her symptoms have now resolved and she has continued to have good p.o. intake and does not have cough or shortness of breath.  However, she would still like to have COVID testing, so this was ordered.  She was instructed to be in line at [facility] by 3 PM today or tomorrow to have this done.  Will continue to call her once weekly to check and even though I do not have appointments available for the next few weeks.  She is aware of this.    Time spent during visit with patient: 13 minutes

## 2019-10-05 ENCOUNTER — Telehealth: Payer: Self-pay | Admitting: Family Medicine

## 2019-10-05 ENCOUNTER — Other Ambulatory Visit: Payer: Self-pay

## 2019-10-05 DIAGNOSIS — Z20822 Contact with and (suspected) exposure to covid-19: Secondary | ICD-10-CM

## 2019-10-05 NOTE — Telephone Encounter (Signed)
Clinical info completed on SCAT form.  Place form in Dr. Maudie Flakes box for completion.  Trestan Vahle Zimmerman Rumple, CMA

## 2019-10-05 NOTE — Telephone Encounter (Signed)
Pt dropped off SCAT form for completion. Forms placed in white team folder at the front desk. jw

## 2019-10-06 ENCOUNTER — Other Ambulatory Visit: Payer: Self-pay | Admitting: Family Medicine

## 2019-10-06 DIAGNOSIS — R7989 Other specified abnormal findings of blood chemistry: Secondary | ICD-10-CM

## 2019-10-06 DIAGNOSIS — Z794 Long term (current) use of insulin: Secondary | ICD-10-CM

## 2019-10-06 DIAGNOSIS — E1143 Type 2 diabetes mellitus with diabetic autonomic (poly)neuropathy: Secondary | ICD-10-CM

## 2019-10-06 NOTE — Telephone Encounter (Signed)
Please let Ms. Hopple know that I will order BMP to check her electrolytes and a TSH to check her thyroid level.  She can call to schedule a lab appointment to get these labs drawn.

## 2019-10-07 LAB — NOVEL CORONAVIRUS, NAA: SARS-CoV-2, NAA: NOT DETECTED

## 2019-10-08 ENCOUNTER — Other Ambulatory Visit: Payer: Medicare Other

## 2019-10-09 ENCOUNTER — Telehealth: Payer: Self-pay | Admitting: Family Medicine

## 2019-10-09 NOTE — Telephone Encounter (Signed)
Patient would like to get the results of her Covid test she had done.  Please call her asap as she is anxious about this, at [phone].

## 2019-10-09 NOTE — Telephone Encounter (Signed)
Pt informed of negative results on her Covid test.Amire Leazer Katharina Caper, CMA

## 2019-10-10 ENCOUNTER — Other Ambulatory Visit: Payer: Self-pay | Admitting: Family Medicine

## 2019-10-10 ENCOUNTER — Telehealth: Payer: Self-pay | Admitting: Family Medicine

## 2019-10-10 DIAGNOSIS — R531 Weakness: Secondary | ICD-10-CM

## 2019-10-10 NOTE — Telephone Encounter (Signed)
SCAT form completed and placed in RN folder.

## 2019-10-10 NOTE — Telephone Encounter (Signed)
Called Rebecca Walter to check in with her regarding her anxiety.  She says that she is doing well overall, although she does feel chilled every now and then.  She says that she was a little too active yesterday and is resting more today.  She is anxious to get her labs drawn on Friday.  She understands that I will be gone next week on vacation and then on nights the 2 weeks after that.  We agreed that, although it is not appropriate for Dr. Higinio Plan to call her after office hours since she is not her PCP, we can make a virtual appointment with Dr. Higinio Plan to call and check in on her next week.  That appointment was made.  We will plan on getting a CBC, TSH, and BMP on Friday, October 23, and one of my colleagues will inform her of these results.  That lab appointment was made today.

## 2019-10-11 ENCOUNTER — Other Ambulatory Visit: Payer: Self-pay | Admitting: Family Medicine

## 2019-10-11 ENCOUNTER — Other Ambulatory Visit: Payer: Self-pay

## 2019-10-11 NOTE — Patient Outreach (Signed)
[facility]) Care Management  10/11/2019  Luticia C Agostinelli 1941/01/04 [id number]   Medication Adherence call to Mrs. Teddy Morton  Spoke with patient she is past due on Lovastatin 40 mg,patient ask to call back at a later time or day. Mrs. Spease is showing past due under [place].   [facility] Direct Dial [phone]  Fax [phone] [email or web address]

## 2019-10-11 NOTE — Telephone Encounter (Signed)
Form faxed to appropriate number. Patient contacted and informed she can come and get the original copy.

## 2019-10-12 ENCOUNTER — Other Ambulatory Visit: Payer: Medicare Other

## 2019-10-12 ENCOUNTER — Other Ambulatory Visit: Payer: Self-pay

## 2019-10-12 DIAGNOSIS — R531 Weakness: Secondary | ICD-10-CM | POA: Diagnosis not present

## 2019-10-12 DIAGNOSIS — E1143 Type 2 diabetes mellitus with diabetic autonomic (poly)neuropathy: Secondary | ICD-10-CM | POA: Diagnosis not present

## 2019-10-12 DIAGNOSIS — Z794 Long term (current) use of insulin: Secondary | ICD-10-CM | POA: Diagnosis not present

## 2019-10-12 DIAGNOSIS — R7989 Other specified abnormal findings of blood chemistry: Secondary | ICD-10-CM

## 2019-10-13 LAB — BASIC METABOLIC PANEL
BUN/Creatinine Ratio: 28 (ref 12–28)
BUN: 29 mg/dL — ABNORMAL HIGH (ref 8–27)
CO2: 21 mmol/L (ref 20–29)
Calcium: 9.8 mg/dL (ref 8.7–10.3)
Chloride: 98 mmol/L (ref 96–106)
Creatinine, Ser: 1.03 mg/dL — ABNORMAL HIGH (ref 0.57–1.00)
GFR calc Af Amer: 59 mL/min/{1.73_m2} — ABNORMAL LOW (ref >59)
GFR calc non Af Amer: 51 mL/min/{1.73_m2} — ABNORMAL LOW (ref >59)
Glucose: 155 mg/dL — ABNORMAL HIGH (ref 65–99)
Potassium: 4.4 mmol/L (ref 3.5–5.2)
Sodium: 135 mmol/L (ref 134–144)

## 2019-10-13 LAB — CBC
Hematocrit: 38.3 % (ref 34.0–46.6)
Hemoglobin: 12.7 g/dL (ref 11.1–15.9)
MCH: 30.5 pg (ref 26.6–33.0)
MCHC: 33.2 g/dL (ref 31.5–35.7)
MCV: 92 fL (ref 79–97)
Platelets: 245 10*3/uL (ref 150–450)
RBC: 4.17 x10E6/uL (ref 3.77–5.28)
RDW: 13.5 % (ref 11.7–15.4)
WBC: 8.6 10*3/uL (ref 3.4–10.8)

## 2019-10-13 LAB — TSH: TSH: 7.49 u[IU]/mL — ABNORMAL HIGH (ref 0.450–4.500)

## 2019-10-16 ENCOUNTER — Telehealth (INDEPENDENT_AMBULATORY_CARE_PROVIDER_SITE_OTHER): Payer: Medicare Other | Admitting: Family Medicine

## 2019-10-16 ENCOUNTER — Other Ambulatory Visit: Payer: Self-pay

## 2019-10-16 DIAGNOSIS — F419 Anxiety disorder, unspecified: Secondary | ICD-10-CM

## 2019-10-16 DIAGNOSIS — R7989 Other specified abnormal findings of blood chemistry: Secondary | ICD-10-CM

## 2019-10-16 NOTE — Assessment & Plan Note (Signed)
Chronic history of intermittent TSH elevations with consistently normal T4 in the past.  TSH elevated to 7.4 within labs on 10/23, unable to see if the T4 was added to those labs after resulted.  Do note that her TSH has been as high as 20 in 2019, has never been on Synthroid.  As she intermittently expresses some hypothyroid symptomatology with TSH>10 previously, this could be an indication to consider trialing Synthroid treatment in the future.

## 2019-10-16 NOTE — Progress Notes (Signed)
[facility] Telemedicine Visit  Patient consented to have virtual visit. Method of visit: Telephone  Encounter participants: Patient: Rebecca Walter - located at Home  Provider: [name] - located at [facility]  Others (if applicable): None   Chief Complaint: F/u anxiety   HPI: [name] is an 78 year old female presenting for follow-up of her anxiety.  She normally follows up with her primary care provider on a weekly basis due to her anxiety.  Today she says she has recently been doing quite well and has no complaints.  She feels she is well adjusted to the pandemic lifestyle and regularly meets with her church group via [place] for Bible study.  She recently got an iPhone from her niece and is excited to try to find all the new features compared to her old flip phone, however says the battery life is terrible.  Currently on Zoloft and BuSpar, well controlled at this time.  She also recently had labs done on 10/23 for which she had not heard these results yet.  We discussed these in depth including her stable renal function, normal CBC, and mildly elevated TSH.  ROS: per HPI  Pertinent PMHx: CHF, hypertension, GERD, T2DM, anxiety, subclinical hypothyroidism  Exam:  Respiratory: Unlabored breathing, speaking full sentences  Assessment/Plan:  Anxiety Currently well controlled on BuSpar and Zoloft.  Follows up on a weekly basis with her primary care provider to minimize anxiety surrounding her complex medical history.  Recommend continued regimen.  Elevated TSH Chronic history of intermittent TSH elevations with consistently normal T4 in the past.  TSH elevated to 7.4 within labs on 10/23, unable to see if the T4 was added to those labs after resulted.  Do note that her TSH has been as high as 20 in 2019, has never been on Synthroid.  As she intermittently expresses some hypothyroid symptomatology with TSH>10 previously, this could be an indication to consider  trialing Synthroid treatment in the future.    Time spent during visit with patient: 15 minutes  [name], DO

## 2019-10-16 NOTE — Assessment & Plan Note (Signed)
Currently well controlled on BuSpar and Zoloft.  Follows up on a weekly basis with her primary care provider to minimize anxiety surrounding her complex medical history.  Recommend continued regimen.

## 2019-10-19 ENCOUNTER — Other Ambulatory Visit: Payer: Self-pay | Admitting: Family Medicine

## 2019-10-19 DIAGNOSIS — Z794 Long term (current) use of insulin: Secondary | ICD-10-CM

## 2019-10-19 DIAGNOSIS — E1143 Type 2 diabetes mellitus with diabetic autonomic (poly)neuropathy: Secondary | ICD-10-CM

## 2019-10-31 ENCOUNTER — Other Ambulatory Visit: Payer: Self-pay | Admitting: Family Medicine

## 2019-10-31 DIAGNOSIS — I10 Essential (primary) hypertension: Secondary | ICD-10-CM

## 2019-11-01 ENCOUNTER — Telehealth: Payer: Self-pay | Admitting: Family Medicine

## 2019-11-01 NOTE — Telephone Encounter (Signed)
Called Rebecca Walter for our weekly phone meeting.  Unfortunately, she did not pick up the phone, so I left a message explaining that she can call her after-hours line if she is concerned about anything, but otherwise we will plan on talking next week.  I let her know that her labs were also not concerning.

## 2019-11-02 ENCOUNTER — Other Ambulatory Visit: Payer: Self-pay

## 2019-11-02 MED ORDER — PANTOPRAZOLE SODIUM 40 MG PO TBEC: 40.0000 mg | DELAYED_RELEASE_TABLET | Freq: Two times a day (BID) | ORAL | 3 refills | Status: DC

## 2019-11-14 ENCOUNTER — Other Ambulatory Visit: Payer: Self-pay | Admitting: *Deleted

## 2019-11-19 MED ORDER — METFORMIN HCL ER 500 MG PO TB24: ORAL_TABLET | ORAL | 3 refills | Status: DC

## 2019-11-22 ENCOUNTER — Telehealth: Payer: Self-pay | Admitting: Family Medicine

## 2019-11-22 NOTE — Telephone Encounter (Signed)
Pt is calling and would like to know if Dr. Shan Levans could give her a call. She said they have weekley phone calls and she has not heard from her yet.   The best call back is [phone]

## 2019-11-23 NOTE — Telephone Encounter (Signed)
I called Rebecca Walter and checked in with how she has been doing lately.  Specifically, we discussed her blood pressure and anxiety.  I recommended taking her amlodipine only once a day and at night, although she says that she feels better and taking 10 mg once followed by either 2.5 or 5 mg later in the day because it seems to help reduce her blood pressure.  Given that she has not experienced any side effects from this and this seems to help her feel less anxious about her blood pressure, I told her that it is okay with me if she continues this, although it is not medically indicated.  I congratulated on her recent use of breathing techniques when she felt more anxious.  She continues to do well with Zoloft.  I will plan on checking in with her again on Tuesday afternoon during her virtual appointment.

## 2019-11-27 ENCOUNTER — Other Ambulatory Visit: Payer: Self-pay

## 2019-11-27 ENCOUNTER — Telehealth (INDEPENDENT_AMBULATORY_CARE_PROVIDER_SITE_OTHER): Payer: Medicare Other | Admitting: Family Medicine

## 2019-11-27 DIAGNOSIS — M8580 Other specified disorders of bone density and structure, unspecified site: Secondary | ICD-10-CM

## 2019-11-27 DIAGNOSIS — R490 Dysphonia: Secondary | ICD-10-CM

## 2019-11-27 DIAGNOSIS — I10 Essential (primary) hypertension: Secondary | ICD-10-CM | POA: Diagnosis not present

## 2019-11-27 DIAGNOSIS — F419 Anxiety disorder, unspecified: Secondary | ICD-10-CM | POA: Diagnosis not present

## 2019-11-27 DIAGNOSIS — Z780 Asymptomatic menopausal state: Secondary | ICD-10-CM

## 2019-11-27 DIAGNOSIS — K224 Dyskinesia of esophagus: Secondary | ICD-10-CM | POA: Diagnosis not present

## 2019-11-27 HISTORY — DX: Dysphonia: R49.0

## 2019-11-27 NOTE — Assessment & Plan Note (Signed)
Encouraged patient to reduce her dosing of Protonix from twice daily to daily and possibly eventually stop her PPI altogether.  Reviewed with patient that this increases her risk of osteoporotic fracture.

## 2019-11-27 NOTE — Assessment & Plan Note (Signed)
We discussed that although her systolic blood pressure can be in the Q000111Q, her diastolic measurement is often in the 60s, so we do not want to add a new agent that could cause her to feel dizzy and possibly increase her risk of falls.  I counseled her that blood pressure is more than just the systolic measurement and that if we averaged her pressures they would look quite normal.

## 2019-11-27 NOTE — Assessment & Plan Note (Signed)
We will continue her current medication dosing.  I will plan to check in with her again in 1 week since this seems to help control her anxiety as well.

## 2019-11-27 NOTE — Assessment & Plan Note (Signed)
Encouraged patient to continue using her sugar-free cough drops, take sips of water throughout the day, and possibly get a humidifier for her apartment.  If this continues to be a problem for her, we could always consider referral to ENT.

## 2019-11-27 NOTE — Progress Notes (Signed)
[facility] Telemedicine Visit  Patient consented to have virtual visit. Method of visit: Telephone  Encounter participants: Patient: Rebecca Walter - located at home Provider: Kathrene Alu - located at [facility] Others (if applicable): none  Chief Complaint: follow up anxiety, medication review, hoarseness  HPI:  [name] says that her anxiety is moderately well controlled.  She was wondering about increasing her Zoloft dose by 50 mg, but since we are already at 200 mg/day, she is reticent to do this.  She continues to take BuSpar 15 mg twice daily.  She reports that Dr. Valentina Lucks had encouraged her to reduce her Protonix dosing from twice daily to deal with those, but she has not yet done this.  She reports hoarseness which she thinks may be due to her chronic dry mouth.  She is using sugar-free cough drops frequently.  This is a chronic problem.  ROS: per HPI  Pertinent PMHx: Anxiety, type 2 diabetes, GERD  Exam:  Respiratory: Able to speak in full sentences without labored breathing, no coughing heard  Assessment/Plan:  Hypertension We discussed that although her systolic blood pressure can be in the Q000111Q, her diastolic measurement is often in the 60s, so we do not want to add a new agent that could cause her to feel dizzy and possibly increase her risk of falls.  I counseled her that blood pressure is more than just the systolic measurement and that if we averaged her pressures they would look quite normal.  Anxiety We will continue her current medication dosing.  I will plan to check in with her again in 1 week since this seems to help control her anxiety as well.  Osteopenia after menopause Encouraged patient to reduce her dosing of Protonix from twice daily to daily and possibly eventually stop her PPI altogether.  Reviewed with patient that this increases her risk of osteoporotic fracture.  Hoarseness Encouraged patient to continue using her  sugar-free cough drops, take sips of water throughout the day, and possibly get a humidifier for her apartment.  If this continues to be a problem for her, we could always consider referral to ENT.    Time spent during visit with patient: 18 minutes

## 2019-12-01 ENCOUNTER — Telehealth: Payer: Self-pay | Admitting: Family Medicine

## 2019-12-01 NOTE — Telephone Encounter (Signed)
Received a call to the after emergency line from patient complaining of anxious breathing.  She said this was consistent with the anxiety issues she was having when she had her telemedicine visit with Dr. Shan Levans earlier this week.  She noted that she has an appointment with Dr. Shan Levans on the 16th to discuss anxiety medication and wanted to know if she could move that up.  We did move it up to the 15th at her request and that is now on her schedule with Dr. Shan Levans.  Of note we discussed that she might benefit from discussing talk therapy with Dr. Shan Levans as medication with therapy is often more helpful to some patients and she seems to be still having issues.  We also discussed her concern for her heart diagnosis which she was mistakenly referring to as cardiac arrest, we discussed that she has a diagnosis of CHF.  She says she is not having shortness of breath, she is not having significant swelling of her legs, she is able to lay flat and sleep and does not have any orthopnea at this time.  Do not think she is in a CHF crisis.  We told her that if she gets significant chest pain or shortness of breath that she could always go to the emergency department but that we would be unable to evaluate those complaints over the phone on the emergency line.  Patient reassured [place] that she thinks this is just her normal anxiety and plans on her telephone visit with Dr. Shan Levans on the 15th  Dr. Criss Rosales

## 2019-12-03 ENCOUNTER — Telehealth: Payer: Self-pay | Admitting: *Deleted

## 2019-12-03 NOTE — Telephone Encounter (Addendum)
Patient has concerns about an episode she had on Saturday with breathing.. all day she felt like she couldn't get enough air.  She called her primary emergency provider and discussed this.  She turned off the furnace and turned on the air and breathing was better by Sunday.  She has torsemide which she takes based on weight or swelling. Has not used.  Breathing fine today.  Sleeps on 3 pillows but always does due to acid reflux.  Would feel better being checked out by Dr. Harrington Challenger.  Requesting virtual.  Scheduled 12/07/19.  Pt appreciative for assistance.

## 2019-12-03 NOTE — Telephone Encounter (Signed)
Patient called in about her cpap machine then she ask to speak to Dr Harrington Challenger nurse about her breathing concerns. I let her know I would get a message to the nurse to give her a call.

## 2019-12-03 NOTE — Telephone Encounter (Signed)
-----   Message from Sueanne Margarita, MD sent at 12/03/2019 10:37 AM EST ----- Regarding: RE: Pressure change Change to auto PAP from 4 to 18cm H2O and get a download in 2 weeks  Traci ----- Message ----- From: Freada Bergeron, CMA Sent: 12/03/2019  10:24 AM EST To: Sueanne Margarita, MD Subject: FW: Pressure change                            She is set on 9 cm ----- Message ----- From: Freada Bergeron, CMA Sent: 12/03/2019  10:21 AM EST To: Sueanne Margarita, MD Subject: Pressure change                                Patient states she feels like she needs more air pressure. Feels like she is not getting enough air. Please advise, Gae Bon

## 2019-12-03 NOTE — Telephone Encounter (Signed)
I have received an order to change pts. pressures from autocpap 6-9 to auto CPAP 4-18 cmH20. I have made the changes via modem and notified the pt.

## 2019-12-04 ENCOUNTER — Other Ambulatory Visit: Payer: Self-pay | Admitting: Family Medicine

## 2019-12-04 ENCOUNTER — Telehealth (INDEPENDENT_AMBULATORY_CARE_PROVIDER_SITE_OTHER): Payer: Medicare Other | Admitting: Family Medicine

## 2019-12-04 ENCOUNTER — Other Ambulatory Visit: Payer: Self-pay

## 2019-12-04 DIAGNOSIS — Z794 Long term (current) use of insulin: Secondary | ICD-10-CM

## 2019-12-04 DIAGNOSIS — E1143 Type 2 diabetes mellitus with diabetic autonomic (poly)neuropathy: Secondary | ICD-10-CM

## 2019-12-04 DIAGNOSIS — F419 Anxiety disorder, unspecified: Secondary | ICD-10-CM

## 2019-12-04 DIAGNOSIS — G4733 Obstructive sleep apnea (adult) (pediatric): Secondary | ICD-10-CM

## 2019-12-04 NOTE — Assessment & Plan Note (Signed)
Agreed with patient's plan to speak with her cardiologist regarding updating her CPAP machine and continuing to use her CPAP.  Her shortness of breath does not appear to be infectious or cardiac in nature and it does seem to be improved.

## 2019-12-04 NOTE — Assessment & Plan Note (Signed)
Counseled patient that it is not a safe option to restart Ativan, and she expressed understanding.  We will continue to hold off on escalating her Zoloft dose with the knowledge that we can always do this in the future.  Other as needed anxiety medication such as hydroxyzine have side effects that would cause her difficulty, such as dry mouth and increased risk of falls, so we will hold off on prescribing these.  I will continue to call her about every week, although she knows I cannot call her over the Christmas holiday.  I will plan to call her in the evenings during my night shift during the first 2 weeks of January, and patient is aware and expressed gratitude for this.

## 2019-12-04 NOTE — Progress Notes (Signed)
[facility] Telemedicine Visit  Patient consented to have virtual visit. Method of visit: Telephone  Encounter participants: Patient: Rebecca Walter - located at home Provider: Kathrene Alu - located at [facility] Others (if applicable): none  Chief Complaint: Follow-up breathing difficulty  HPI:  Began having trouble breathing, especially during inhalation, on 12/12 and spoke with one of our residents regarding this issue.  She said that she thought it was anxiety at the time, but since then she now believes it was due to her lack of CPAP use.  She had stopped her CPAP due to a bruise under her breast that was exacerbated by the hosing in January of this year and just never restarted it.  She has called her heart doctor to get CPAP supplies and will do a virtual visit with her later this week.  In the meantime, she has restarted her current CPAP machine, which has helped her breathing.  She denies weight gain or excess edema in abdomen or legs.  She also denies systemic symptoms such as fever and fatigue and denies cough.  She feels like her anxiety has improved now that her breathing has improved, although it still can be a problem for her.  She would like to hold off on increasing her Zoloft since she only has 50 more milligrams before maxing out on the daily dose of this medication.  She would like to restart Ativan if possible.  ROS: per HPI  Pertinent PMHx: Diastolic CHF, anxiety, OSA  Exam:  Respiratory: Speaks in full sentences without shortness of breath, no cough  Assessment/Plan:  Obstructive sleep apnea Agreed with patient's plan to speak with her cardiologist regarding updating her CPAP machine and continuing to use her CPAP.  Her shortness of breath does not appear to be infectious or cardiac in nature and it does seem to be improved.  Anxiety Counseled patient that it is not a safe option to restart Ativan, and she expressed understanding.  We will  continue to hold off on escalating her Zoloft dose with the knowledge that we can always do this in the future.  Other as needed anxiety medication such as hydroxyzine have side effects that would cause her difficulty, such as dry mouth and increased risk of falls, so we will hold off on prescribing these.  I will continue to call her about every week, although she knows I cannot call her over the Christmas holiday.  I will plan to call her in the evenings during my night shift during the first 2 weeks of January, and patient is aware and expressed gratitude for this.    Time spent during visit with patient: 15 minutes

## 2019-12-05 ENCOUNTER — Telehealth: Payer: Medicare Other | Admitting: Family Medicine

## 2019-12-06 NOTE — Progress Notes (Signed)
Cardiology Office Note   Date:  12/07/2019   ID:  Rebecca Walter, DOB 1941-06-05, MRN [id number]  PCP:  Kathrene Alu, MD  Cardiologist:   Dorris Carnes, MD   F/U of HTN     History of Present Illness: Rebecca Walter is a 78 y.o. female with a history of HTN, HL, DM, GERD, diastolic CHF  Pt had stress test remotely    Hx of hypertensive emergency in pasat    Last echo  2018 LVEF 60 to 03%  Mild diastolic dysfunction.   Myovue low risk in 2018 No significant ischemia   I saw her in 2019   She was seen by B Bhagat and M supple since  Last by N Hmmond in Aug 2020    The pt denies CP   She does get some SOB  She did have a spell yesterday when vision bcame blurry   Weak   Got up DId not faint   No focal weakness  Denied syncope   Denied palpitations   Current Meds  Medication Sig  . acetaminophen (TYLENOL) 325 MG tablet Take 325 mg by mouth every 8 (eight) hours as needed (pain).   Marland Kitchen allopurinol (ZYLOPRIM) 100 MG tablet TAKE 2 TABLETS BY MOUTH EVERY DAY  . amLODipine (NORVASC) 10 MG tablet TAKE 1 TABLET BY MOUTH EVERY DAY  . amLODipine (NORVASC) 2.5 MG tablet TAKE 1 TABLET BY MOUTH TWICE A DAY  . amLODipine (NORVASC) 5 MG tablet TAKE 1 TABLET BY MOUTH EVERYDAY AT BEDTIME  . aspirin 81 MG chewable tablet Chew 1 tablet (81 mg total) by mouth daily.  . baclofen (LIORESAL) 10 MG tablet Take 10 mg by mouth 2 (two) times daily.  . Blood Glucose Monitoring Suppl (ACCU-CHEK AVIVA PLUS) w/Device KIT 1 Device by Does not apply route daily.  . busPIRone (BUSPAR) 15 MG tablet TAKE 1 TABLET (15 MG TOTAL) BY MOUTH 2 (TWO) TIMES DAILY.  Marland Kitchen Camphor-Eucalyptus-Menthol (VICKS VAPORUB EX) Place 1 application into both nostrils daily as needed (congestion).  . famotidine (PEPCID) 20 MG tablet Take 20 mg by mouth daily.  Marland Kitchen glucose blood (ACCU-CHEK AVIVA PLUS) test strip CHECK BLOOD SUGAR FIRST  THING IN THE MORNING BEFORE EATING, AFTER LUNCH, AND  AFTER DINNER TOTAL 3 TIMES  DAILY  . insulin aspart  (NOVOLOG FLEXPEN) 100 UNIT/ML FlexPen Inject 9-18 Units into the skin See admin instructions. Inject 15 units subcutaneously daily with breakfast, inject 9 units with lunch as needed for CBG 180 or more, and inject 9 units with supper  . Insulin Pen Needle (B-D ULTRAFINE III SHORT PEN) 31G X 8 MM MISC USE TO INJECT INSULIN 4 TIMES DAILY OR AS DIRECTED.  Marland Kitchen LANTUS SOLOSTAR 100 UNIT/ML Solostar Pen INJECT 55 UNITS INTO THE SKIN DAILY. IN THE MORNING  . loratadine (CLARITIN) 10 MG tablet Take 10 mg by mouth daily.  Marland Kitchen lovastatin (MEVACOR) 40 MG tablet Take 1 tablet (40 mg total) by mouth daily with supper.  . metFORMIN (GLUCOPHAGE-XR) 500 MG 24 hr tablet TAKE 1 TABLET TWICE A DAY BEFORE MEALS  . Multiple Vitamin (MULTIVITAMIN WITH MINERALS) TABS tablet Take 1 tablet by mouth daily.  . pantoprazole (PROTONIX) 40 MG tablet Take 1 tablet (40 mg total) by mouth 2 (two) times daily.  . sertraline (ZOLOFT) 100 MG tablet TAKE 1 TABLET BY MOUTH EVERY DAY  . sertraline (ZOLOFT) 50 MG tablet Take 1 tablet (50 mg total) by mouth 2 (two) times daily. One dose in the  afternoon and a second before bed.  . spironolactone (ALDACTONE) 25 MG tablet Take 1 tablet (25 mg total) by mouth daily.  Marland Kitchen torsemide (DEMADEX) 10 MG tablet TAKE 1 TABLET BY MOUTH EVERY DAY AS NEEDED  . traZODone (DESYREL) 50 MG tablet Take 1 tablet (50 mg total) by mouth at bedtime.  . valsartan (DIOVAN) 320 MG tablet Take 1 tablet (320 mg total) by mouth daily.     Allergies:   Baclofen, Hctz [hydrochlorothiazide], Lotensin [benazepril], Victoza [liraglutide], Acyclovir and related, Beta adrenergic blockers, Losartan, and Other   Past Medical History:  Diagnosis Date  . Anxiety   . Arthritis   . Chronic diastolic CHF (congestive heart failure) ([place])   . Diabetes mellitus   . GERD (gastroesophageal reflux disease)   . Gout   . Hyperlipidemia   . Hypertension   . Hypertensive heart disease with CHF (congestive heart failure) ([place])   . Mild  aortic stenosis 06/2017  . Mild mitral regurgitation 06/2017  . Moderate pulmonary valve insufficiency 06/2017  . Osteopenia     Past Surgical History:  Procedure Laterality Date  . KIDNEY SURGERY    . KNEE SURGERY    . MENISECTOMY    . WRIST SURGERY       Social History:  The patient  reports that she has never smoked. She has never used smokeless tobacco. She reports that she does not drink alcohol or use drugs.   Family History:  The patient's family history includes Cancer in her brother and sister; Diabetes in her maternal uncle; Heart disease in her brother and father; Hypertension in her father; Stroke in her father and mother.    ROS:  Please see the history of present illness. All other systems are reviewed and  Negative to the above problem except as noted.    PHYSICAL EXAM: VS:  BP (!) 128/56   Pulse 64   Ht 5' (1.524 m)   Wt 194 lb 3.2 oz (88.1 kg)   LMP  (LMP Unknown)   SpO2 95%   BMI 37.93 kg/m    GEN: Morbidly obese 78 yo , in no acute distress  HEENT: normal  Neck: JVP normal  Cardiac: RRR; no murmurs, Respiratory:  clear to auscultation bilaterally, normal work of breathing GI: soft, MS:  Moving all extremities   Skin: warm  Neuro:  CN II to XII grossly intact   No gross abnormalties    Psych: euthymic mood, full affect   EKG:  EKG is not ordered today.   Lipid Panel    Component Value Date/Time   CHOL 154 10/16/2018 1415   TRIG 323 (H) 10/16/2018 1415   HDL 35 (L) 10/16/2018 1415   CHOLHDL 4.4 10/16/2018 1415   CHOLHDL 4.2 07/16/2017 0402   VLDL 50 (H) 07/16/2017 0402   LDLCALC 54 10/16/2018 1415   LDLDIRECT 103 (H) 03/11/2010 2127      Wt Readings from Last 3 Encounters:  12/07/19 194 lb 3.2 oz (88.1 kg)  08/08/19 189 lb (85.7 kg)  07/20/19 185 lb 9.6 oz (84.2 kg)      ASSESSMENT AND PLAN:  1  Spells   Not clear if spells represent orthostasis, but begin before standing   ? TIA Will set up for carotid USN   Will also set up  for event monitor to try to capture arrhythmia Counselled pt to be careful   If has another spell, not to just get up and try to move around  2  HTN  BP is controlled  3  HL  Keep on lovastatin  LDL in 2019 was 54     4  Diastolic CHF  Volume status is OK  Keep on same meds  Labs were OK    1/u in 1 month virtually to review results, symptoms     Current medicines are reviewed at length with the patient today.  The patient does not have concerns regarding medicines.  Signed, Dorris Carnes, MD  12/07/2019 10:58 AM    [address], [place], [place]  [address] Phone: [phone]; Fax: [phone]

## 2019-12-07 ENCOUNTER — Telehealth: Payer: Self-pay | Admitting: Radiology

## 2019-12-07 ENCOUNTER — Other Ambulatory Visit: Payer: Self-pay

## 2019-12-07 ENCOUNTER — Ambulatory Visit: Payer: Medicare Other | Admitting: Internal Medicine

## 2019-12-07 ENCOUNTER — Encounter: Payer: Self-pay | Admitting: Internal Medicine

## 2019-12-07 VITALS — BP 128/56 | HR 64 | Ht 60.0 in | Wt 194.2 lb

## 2019-12-07 DIAGNOSIS — R42 Dizziness and giddiness: Secondary | ICD-10-CM

## 2019-12-07 DIAGNOSIS — E785 Hyperlipidemia, unspecified: Secondary | ICD-10-CM | POA: Diagnosis not present

## 2019-12-07 DIAGNOSIS — I10 Essential (primary) hypertension: Secondary | ICD-10-CM

## 2019-12-07 NOTE — Patient Instructions (Signed)
Medication Instructions:  *If you need a refill on your cardiac medications before your next appointment, please call your pharmacy*  Lab Work:  If you have labs (blood work) drawn today and your tests are completely normal, you will receive your results only by: Marland Kitchen MyChart Message (if you have MyChart) OR . A paper copy in the mail If you have any lab test that is abnormal or we need to change your treatment, we will call you to review the results.  Testing/Procedures: Your physician has requested that you have a carotid duplex. This test is an ultrasound of the carotid arteries in your neck. It looks at blood flow through these arteries that supply the brain with blood. Allow one hour for this exam. There are no restrictions or special instructions.  Your physician has recommended that you wear an event monitor. Event monitors are medical devices that record the heart's electrical activity. Doctors most often [place] these monitors to diagnose arrhythmias. Arrhythmias are problems with the speed or rhythm of the heartbeat. The monitor is a small, portable device. You can wear one while you do your normal daily activities. This is usually used to diagnose what is causing palpitations/syncope (passing out).    Follow-Up: At [facility], you and your health needs are our priority.  As part of our continuing mission to provide you with exceptional heart care, we have created designated Provider Care Teams.  These Care Teams include your primary Cardiologist (physician) and Advanced Practice Providers (APPs -  Physician Assistants and Nurse Practitioners) who all work together to provide you with the care you need, when you need it.  Your next appointment:   1 month(s)  The format for your next appointment:   Virtual Visit   Provider:   You may see Dorris Carnes, MD or one of the following Advanced Practice Providers on your designated Care Team:    Richardson Dopp, PA-C  [place],  [place]  Daune Perch, [place]

## 2019-12-07 NOTE — Telephone Encounter (Signed)
Enrolled patient for a 14 day Zio monitor to be mailed to patients home.  

## 2019-12-10 ENCOUNTER — Other Ambulatory Visit: Payer: Self-pay

## 2019-12-10 MED ORDER — TRAZODONE HCL 50 MG PO TABS: 50.0000 mg | ORAL_TABLET | Freq: Every day | ORAL | 11 refills | Status: DC

## 2019-12-11 ENCOUNTER — Encounter: Payer: Self-pay | Admitting: Podiatry

## 2019-12-11 ENCOUNTER — Ambulatory Visit (INDEPENDENT_AMBULATORY_CARE_PROVIDER_SITE_OTHER): Payer: Medicare Other | Admitting: Podiatry

## 2019-12-11 ENCOUNTER — Other Ambulatory Visit: Payer: Self-pay

## 2019-12-11 ENCOUNTER — Other Ambulatory Visit: Payer: Self-pay | Admitting: Internal Medicine

## 2019-12-11 DIAGNOSIS — E1143 Type 2 diabetes mellitus with diabetic autonomic (poly)neuropathy: Secondary | ICD-10-CM | POA: Diagnosis not present

## 2019-12-11 DIAGNOSIS — B351 Tinea unguium: Secondary | ICD-10-CM

## 2019-12-11 DIAGNOSIS — M79675 Pain in left toe(s): Secondary | ICD-10-CM | POA: Diagnosis not present

## 2019-12-11 DIAGNOSIS — Z794 Long term (current) use of insulin: Secondary | ICD-10-CM

## 2019-12-11 DIAGNOSIS — R42 Dizziness and giddiness: Secondary | ICD-10-CM

## 2019-12-11 DIAGNOSIS — M79674 Pain in right toe(s): Secondary | ICD-10-CM

## 2019-12-11 NOTE — Progress Notes (Signed)
Subjective: Rebecca Walter is seen today for preventative diabetic foot care follow up painful, elongated, thickened toenails bilateral feet that she cannot cut. Pain interferes with daily activities. Aggravating factor includes wearing enclosed shoe gear and relieved with periodic debridement.  She relates she has gotten a new pair of New Balance sneakers and inserts.  She is scheduled to have a carotid ultrasound on tomorrow.   Medications reviewed in chart.  Allergies  Allergen Reactions  . Baclofen Other (See Comments)    Caused leg weakness  . Hctz [Hydrochlorothiazide] Other (See Comments)    Uric acid elevation and gout.     . Lotensin [Benazepril] Other (See Comments)    Dry cough   . Victoza [Liraglutide] Other (See Comments)    Tongue Glossitus  . Acyclovir And Related Diarrhea  . Beta Adrenergic Blockers Other (See Comments)    Occurred with metoprolol  REACTION: coughing  . Losartan Diarrhea  . Other Diarrhea and Other (See Comments)    Occurred with metoprolol  REACTION: coughing     Objective:  Vascular Examination: Capillary refill time immediate b/l.  Dorsalis pedis present b/l.  Posterior tibial pulses present b/l.  Digital hair present b/l.   Skin temperature gradient WNL b/l.   Dermatological Examination: Skin with normal turgor, texture and tone b/l.  Toenails 1-5 b/l discolored, thick, dystrophic with subungual debris and pain with palpation to nailbeds due to thickness of nails.  Musculoskeletal: Muscle strength 5/5 to all LE muscle groups b/l.  Crossover hammertoe deformity b/l 2nd digits.  No pain, crepitus or joint limitation noted with ROM.   Neurological Examination: Protective sensation intact with 10 gram monofilament bilaterally.  Epicritic sensation present bilaterally.  Hemoglobin A1C Latest Ref Rng & Units 07/20/2019 03/26/2019 12/29/2018  HGBA1C 0.0 - 7.0 % 6.2 7.0(H) 6.5  Some recent data might be hidden    Assessment: Painful onychomycosis toenails 1-5 b/l  NIDDM on long term insulin  Plan: 1. No new findings. No new orders. 2. Toenails 1-5 b/l were debrided in length and girth without iatrogenic bleeding. 3. Patient to continue soft, supportive shoe gear daily. 4. Patient to report any pedal injuries to medical professional immediately. 5. Follow up 3 months.  6. Patient/POA to call should there be a concern in the interim.

## 2019-12-11 NOTE — Patient Instructions (Signed)

## 2019-12-12 ENCOUNTER — Other Ambulatory Visit: Payer: Self-pay

## 2019-12-12 ENCOUNTER — Ambulatory Visit (HOSPITAL_COMMUNITY)
Admission: RE | Admit: 2019-12-12 | Discharge: 2019-12-12 | Disposition: A | Discharge status: Home / Self Care | Payer: Medicare Other | Source: Ambulatory Visit | Attending: Cardiovascular Disease | Admitting: Cardiovascular Disease

## 2019-12-12 DIAGNOSIS — R42 Dizziness and giddiness: Secondary | ICD-10-CM | POA: Diagnosis not present

## 2019-12-12 MED ORDER — TRAZODONE HCL 50 MG PO TABS: 50.0000 mg | ORAL_TABLET | Freq: Every day | ORAL | 3 refills | Status: DC

## 2019-12-12 NOTE — Telephone Encounter (Signed)
Requesting 90 day supply for patient's trazodone.  Resend script to pharmacy.   Rebecca Walter,CMA

## 2019-12-12 NOTE — Addendum Note (Signed)
Addended by: Valerie Roys on: 12/12/2019 03:21 PM   Modules accepted: Orders

## 2019-12-13 ENCOUNTER — Ambulatory Visit (INDEPENDENT_AMBULATORY_CARE_PROVIDER_SITE_OTHER): Payer: Medicare Other

## 2019-12-13 DIAGNOSIS — G4733 Obstructive sleep apnea (adult) (pediatric): Secondary | ICD-10-CM | POA: Diagnosis not present

## 2019-12-13 DIAGNOSIS — I10 Essential (primary) hypertension: Secondary | ICD-10-CM | POA: Diagnosis not present

## 2019-12-13 DIAGNOSIS — E785 Hyperlipidemia, unspecified: Secondary | ICD-10-CM | POA: Diagnosis not present

## 2019-12-13 DIAGNOSIS — R42 Dizziness and giddiness: Secondary | ICD-10-CM

## 2019-12-24 ENCOUNTER — Telehealth: Payer: Self-pay | Admitting: Family Medicine

## 2019-12-24 NOTE — Telephone Encounter (Signed)
Pt is calling and would like to speak with Dr. Shan Levans. Dr. Shan Levans does not have any appointments available so she would like for her to call her to discuss recent nose bleeds.   The best CB number is [phone]

## 2019-12-24 NOTE — Telephone Encounter (Signed)
Ms. Patten reports that on Friday, January 1, she had a light bloody nose from the right nostril.  She thinks this was due to "crustiness" and irritation in her nose.  She said she put a cold, wet paper towel underneath her nostril and the bleeding stopped within a few minutes.  She has not had any other episodes of epistaxis since then.  I told Ms. Shareef that the vessels in the nose are quite fragile and can easily bleed especially when the area is dry, and this is likely what happened in her case.  I reviewed with her techniques for stopping a nosebleed.  She is also concerned that her abdomen appears to be bigger than it usually is.  She says that her pants are not fitting tighter and her weight has not gone up, however.  She says that she spoke with her GI doctor about this and they said did continue to monitor it.  She denies any abdominal pain or changes in appetite.  She is worried that she is having fluid in her abdomen due to a liver problem.  I reassured Ms. Chasteen that her LFTs have been normal, and her liver is unlikely to be causing the symptoms.  It could be due to some fluid overload from her heart failure, but since she has not been gaining weight and has not had pedal edema, this is less likely.  I encouraged her to let me or her GI physician know if she starts experiencing abdominal pain, her abdominal girth continues to increase, or if she begins to feel unwell at all, and she may need to come in for a physical exam and discussion of further work-up.  I will continue to check in with her weekly to keep her anxiety under control and answer any questions she has.  Mariyam Remington C. Shan Levans, MD PGY-3, [facility] 12/24/2019 9:13 PM

## 2019-12-26 ENCOUNTER — Ambulatory Visit (INDEPENDENT_AMBULATORY_CARE_PROVIDER_SITE_OTHER): Payer: Medicare Other

## 2019-12-26 ENCOUNTER — Other Ambulatory Visit: Payer: Self-pay

## 2019-12-26 VITALS — BP 148/59 | Ht 60.0 in | Wt 186.0 lb

## 2019-12-26 DIAGNOSIS — Z0000 Encounter for general adult medical examination without abnormal findings: Secondary | ICD-10-CM

## 2019-12-26 NOTE — Patient Instructions (Addendum)
You spoke to Dorna Bloom, Rebecca Walter over the phone for your annual wellness visit.  We discussed goals: Goals    . Exercise 3x per week (30 min per time)     Patient states its too cold to walk outside. I encouraged her to walk around her home.    . Weight (lb) < 180 lb (81.6 kg)      We also discussed recommended health maintenance. As discussed, you are up to date with pretty much everything! You have an eye apt scheduled for July 2021 and PCP apt for diabetes on 01/23/2020.  Health Maintenance  Topic Date Due  . OPHTHALMOLOGY EXAM  05/04/2019  . FOOT EXAM  12/30/2019  . HEMOGLOBIN A1C  01/20/2020  . TETANUS/TDAP  11/22/2027  . INFLUENZA VACCINE  Completed  . DEXA SCAN  Completed  . PNA vac Low Risk Adult  Completed   Continue to work on diet to reach goal weight of 180 and less. I know its too cold to walk outside, however you can walk the perimeter of your apartment daily. I scheduled you an apt for diabetes with PCP on 01/23/2020 '@10' :30am. Sign up for the Covid Vaccine!   Preventive Care 37 Years and Older, Female Preventive care refers to lifestyle choices and visits with your health care provider that can promote health and wellness. This includes:  A yearly physical exam. This is also called an annual well check.  Regular dental and eye exams.  Immunizations.  Screening for certain conditions.  Healthy lifestyle choices, such as diet and exercise. What can I expect for my preventive care visit? Physical exam Your health care provider will check:  Height and weight. These may be used to calculate body mass index (BMI), which is a measurement that tells if you are at a healthy weight.  Heart rate and blood pressure.  Your skin for abnormal spots. Counseling Your health care provider may ask you questions about:  Alcohol, tobacco, and drug use.  Emotional well-being.  Home and relationship well-being.  Sexual activity.  Eating habits.  History of  falls.  Memory and ability to understand (cognition).  Work and work Statistician.  Pregnancy and menstrual history. What immunizations do I need?  Influenza (flu) vaccine  This is recommended every year. Tetanus, diphtheria, and pertussis (Tdap) vaccine  You may need a Td booster every 10 years. Varicella (chickenpox) vaccine  You may need this vaccine if you have not already been vaccinated. Zoster (shingles) vaccine  You may need this after age 81. Pneumococcal conjugate (PCV13) vaccine  One dose is recommended after age 81. Pneumococcal polysaccharide (PPSV23) vaccine  One dose is recommended after age 81. Measles, mumps, and rubella (MMR) vaccine  You may need at least one dose of MMR if you were born in 1957 or later. You may also need a second dose. Meningococcal conjugate (MenACWY) vaccine  You may need this if you have certain conditions. Hepatitis A vaccine  You may need this if you have certain conditions or if you travel or work in places where you may be exposed to hepatitis A. Hepatitis B vaccine  You may need this if you have certain conditions or if you travel or work in places where you may be exposed to hepatitis B. Haemophilus influenzae type b (Hib) vaccine  You may need this if you have certain conditions. You may receive vaccines as individual doses or as more than one vaccine together in one shot (combination vaccines). Talk with your health  care provider about the risks and benefits of combination vaccines. What tests do I need? Blood tests  Lipid and cholesterol levels. These may be checked every 5 years, or more frequently depending on your overall health.  Hepatitis C test.  Hepatitis B test. Screening  Lung cancer screening. You may have this screening every year starting at age 72 if you have a 30-pack-year history of smoking and currently smoke or have quit within the past 15 years.  Colorectal cancer screening. All adults should  have this screening starting at age 5 and continuing until age 19. Your health care provider may recommend screening at age 12 if you are at increased risk. You will have tests every 1-10 years, depending on your results and the type of screening test.  Diabetes screening. This is done by checking your blood sugar (glucose) after you have not eaten for a while (fasting). You may have this done every 1-3 years.  Mammogram. This may be done every 1-2 years. Talk with your health care provider about how often you should have regular mammograms.  BRCA-related cancer screening. This may be done if you have a family history of breast, ovarian, tubal, or peritoneal cancers. Other tests  Sexually transmitted disease (STD) testing.  Bone density scan. This is done to screen for osteoporosis. You may have this done starting at age 81. Follow these instructions at home: Eating and drinking  Eat a diet that includes fresh fruits and vegetables, whole grains, lean protein, and low-fat dairy products. Limit your intake of foods with high amounts of sugar, saturated fats, and salt.  Take vitamin and mineral supplements as recommended by your health care provider.  Do not drink alcohol if your health care provider tells you not to drink.  If you drink alcohol: ? Limit how much you have to 0-1 drink a day. ? Be aware of how much alcohol is in your drink. In the [place] one drink equals one 12 oz bottle of beer (355 mL), one 5 oz glass of wine (148 mL), or one 1 oz glass of hard liquor (44 mL). Lifestyle  Take daily care of your teeth and gums.  Stay active. Exercise for at least 30 minutes on 5 or more days each week.  Do not use any products that contain nicotine or tobacco, such as cigarettes, e-cigarettes, and chewing tobacco. If you need help quitting, ask your health care provider.  If you are sexually active, practice safe sex. Use a condom or other form of protection in order to prevent STIs  (sexually transmitted infections).  Talk with your health care provider about taking a low-dose aspirin or statin. What's next?  Go to your health care provider once a year for a well check visit.  Ask your health care provider how often you should have your eyes and teeth checked.  Stay up to date on all vaccines. This information is not intended to replace advice given to you by your health care provider. Make sure you discuss any questions you have with your health care provider. Document Revised: 11/30/2018 Document Reviewed: 11/30/2018 Elsevier Patient Education  [place].   Diet Recommendations for Diabetes   1. Eat at least 3 meals and 1-2 snacks per day. Never go more than 4-5 hours while awake without eating. Eat breakfast within the first hour of getting up.   2. Limit starchy foods to TWO per meal and ONE per snack. ONE portion of a starchy  food is equal to  the following:   - ONE slice of bread (or its equivalent, such as half of a hamburger bun).   - 1/2 cup of a "scoopable" starchy food such as potatoes or rice.   - 15 grams of Total Carbohydrate as shown on food label.  3. Include at every meal: a protein food, a carb food, and vegetables and/or fruit.   - Obtain twice the volume of vegetables as protein or carbohydrate foods for both lunch and dinner.   - Fresh or frozen vegetables are best.   - Keep frozen vegetables on hand for a quick vegetable serving.       Starchy (carb) foods: Bread, rice, pasta, potatoes, corn, cereal, grits, crackers, bagels, muffins, all baked goods.  (Fruits, milk, and yogurt also have carbohydrate, but most of these foods will not spike your blood sugar as most starchy foods will.)  A few fruits do cause high blood sugars; use small portions of bananas (limit to 1/2 at a time), grapes, watermelon, oranges, and most tropical fruits.    Protein foods: Meat, fish, poultry, eggs, dairy foods, and beans such as pinto and kidney beans  (beans also provide carbohydrate).      Our clinic's number is [phone]. Please call with questions or concerns about what we discussed today.

## 2019-12-26 NOTE — Progress Notes (Addendum)
Subjective:   Rebecca Walter is a 79 y.o. female who presents for Medicare Annual (Subsequent) preventive examination.  The patient consented to a virtual visit.  Review of Systems: Defer to PCP  Cardiac Risk Factors include: advanced age (>47mn, >>28women);obesity (BMI >30kg/m2);diabetes mellitus;hypertension  Objective:   Vitals: BP (!) 148/59 Comment: Patient reported  Ht 5' (1.524 m)   Wt 186 lb (84.4 kg)   LMP  (LMP Unknown)   BMI 36.33 kg/m   Body mass index is 36.33 kg/m.  Advanced Directives 12/26/2019 12/26/2019 05/17/2019 03/26/2019 02/14/2019 01/11/2019 12/29/2018  Does Patient Have a Medical Advance Directive? - Yes Yes No No No No  Type of AIndustrial/product designerof AFreescale SemiconductorPower of Attorney - - - -  Does patient want to make changes to medical advance directive? - - No - Patient declined - - - -  Copy of HHannahin Chart? Yes - validated most recent copy scanned in chart (See row information) - Yes - validated most recent copy scanned in chart (See row information) - - - -  Would patient like information on creating a medical advance directive? - - - No - Patient declined No - Patient declined No - Patient declined No - Patient declined   Tobacco Social History   Tobacco Use  Smoking Status Never Smoker  Smokeless Tobacco Never Used     Clinical Intake:  Pre-visit preparation completed: Yes  How often do you need to have someone help you when you read instructions, pamphlets, or other written materials from your doctor or pharmacy?: 2 - Rarely What is the last grade level you completed in school?: High School  Past Medical History:  Diagnosis Date  . Anxiety   . Arthritis   . Chronic diastolic CHF (congestive heart failure) ([place]   . Diabetes mellitus   . GERD (gastroesophageal reflux disease)   . Gout   . Hyperlipidemia   . Hypertension   . Hypertensive heart disease with CHF  (congestive heart failure) ([place]   . Mild aortic stenosis 06/2017  . Mild mitral regurgitation 06/2017  . Moderate pulmonary valve insufficiency 06/2017  . Osteopenia    Past Surgical History:  Procedure Laterality Date  . KIDNEY SURGERY    . KNEE SURGERY    . MENISECTOMY    . WRIST SURGERY     Family History  Problem Relation Age of Onset  . Stroke Mother   . Heart disease Father   . Stroke Father   . Hypertension Father   . Cancer Sister   . Cancer Brother   . Heart disease Brother   . Diabetes Maternal Uncle    Social History   Socioeconomic History  . Marital status: Widowed    Spouse name: Not on file  . Number of children: 1  . Years of education: 189 . Highest education level: Not on file  Occupational History  . Occupation: retired-office work  Tobacco Use  . Smoking status: Never Smoker  . Smokeless tobacco: Never Used  Substance and Sexual Activity  . Alcohol use: No  . Drug use: No  . Sexual activity: Not Currently  Other Topics Concern  . Not on file  Social History Narrative   [place].    Patient has one son who checks in on her daily and helps with various things.   Patient enjoys watching TV, facebook, plants,  and cooking.    Patient has an aide who comes Mondays and Fridays.       Social Determinants of Health   Financial Resource Strain: Low Risk   . Difficulty of Paying Living Expenses: Not hard at all  Food Insecurity: No Food Insecurity  . Worried About Charity fundraiser in the Last Year: Never true  . Ran Out of Food in the Last Year: Never true  Transportation Needs: No Transportation Needs  . Lack of Transportation (Medical): No  . Lack of Transportation (Non-Medical): No  Physical Activity: Insufficiently Active  . Days of Exercise per Week: 1 day  . Minutes of Exercise per Session: 20 min  Stress: No Stress Concern Present  . Feeling of Stress : Only a little  Social Connections:  Somewhat Isolated  . Frequency of Communication with Friends and Family: More than three times a week  . Frequency of Social Gatherings with Friends and Family: More than three times a week  . Attends Religious Services: More than 4 times per year  . Active Member of Clubs or Organizations: No  . Attends Archivist Meetings: Never  . Marital Status: Widowed   Outpatient Encounter Medications as of 12/26/2019  Medication Sig  . acetaminophen (TYLENOL) 325 MG tablet Take 325 mg by mouth every 8 (eight) hours as needed (pain).   Marland Kitchen allopurinol (ZYLOPRIM) 100 MG tablet TAKE 2 TABLETS BY MOUTH EVERY DAY  . amLODipine (NORVASC) 10 MG tablet TAKE 1 TABLET BY MOUTH EVERY DAY  . amLODipine (NORVASC) 2.5 MG tablet TAKE 1 TABLET BY MOUTH TWICE A DAY  . amLODipine (NORVASC) 5 MG tablet TAKE 1 TABLET BY MOUTH EVERYDAY AT BEDTIME  . aspirin 81 MG chewable tablet Chew 1 tablet (81 mg total) by mouth daily.  . baclofen (LIORESAL) 10 MG tablet Take 10 mg by mouth 2 (two) times daily.  . Blood Glucose Monitoring Suppl (ACCU-CHEK AVIVA PLUS) w/Device KIT 1 Device by Does not apply route daily.  . busPIRone (BUSPAR) 15 MG tablet TAKE 1 TABLET (15 MG TOTAL) BY MOUTH 2 (TWO) TIMES DAILY.  Marland Kitchen Camphor-Eucalyptus-Menthol (VICKS VAPORUB EX) Place 1 application into both nostrils daily as needed (congestion).  . famotidine (PEPCID) 20 MG tablet Take 20 mg by mouth daily.  Marland Kitchen glucose blood (ACCU-CHEK AVIVA PLUS) test strip CHECK BLOOD SUGAR FIRST  THING IN THE MORNING BEFORE EATING, AFTER LUNCH, AND  AFTER DINNER TOTAL 3 TIMES  DAILY  . insulin aspart (NOVOLOG FLEXPEN) 100 UNIT/ML FlexPen Inject 9-18 Units into the skin See admin instructions. Inject 15 units subcutaneously daily with breakfast, inject 9 units with lunch as needed for CBG 180 or more, and inject 9 units with supper  . Insulin Pen Needle (B-D ULTRAFINE III SHORT PEN) 31G X 8 MM MISC USE TO INJECT INSULIN 4 TIMES DAILY OR AS DIRECTED.  Marland Kitchen LANTUS  SOLOSTAR 100 UNIT/ML Solostar Pen INJECT 55 UNITS INTO THE SKIN DAILY. IN THE MORNING  . loratadine (CLARITIN) 10 MG tablet Take 10 mg by mouth daily.  Marland Kitchen lovastatin (MEVACOR) 40 MG tablet Take 1 tablet (40 mg total) by mouth daily with supper.  . metFORMIN (GLUCOPHAGE-XR) 500 MG 24 hr tablet TAKE 1 TABLET TWICE A DAY BEFORE MEALS  . Multiple Vitamin (MULTIVITAMIN WITH MINERALS) TABS tablet Take 1 tablet by mouth daily.  . pantoprazole (PROTONIX) 40 MG tablet Take 1 tablet (40 mg total) by mouth 2 (two) times daily.  . sertraline (ZOLOFT) 100 MG tablet TAKE 1  TABLET BY MOUTH EVERY DAY  . sertraline (ZOLOFT) 50 MG tablet Take 1 tablet (50 mg total) by mouth 2 (two) times daily. One dose in the afternoon and a second before bed.  . spironolactone (ALDACTONE) 25 MG tablet Take 1 tablet (25 mg total) by mouth daily.  Marland Kitchen torsemide (DEMADEX) 10 MG tablet TAKE 1 TABLET BY MOUTH EVERY DAY AS NEEDED  . traZODone (DESYREL) 50 MG tablet Take 1 tablet (50 mg total) by mouth at bedtime.  . valsartan (DIOVAN) 320 MG tablet Take 1 tablet (320 mg total) by mouth daily.  . [DISCONTINUED] busPIRone (BUSPAR) 15 MG tablet Take 1 tablet (15 mg total) by mouth 2 (two) times daily.  . [DISCONTINUED] busPIRone (BUSPAR) 15 MG tablet TAKE 1 TABLET (15 MG TOTAL) BY MOUTH 2 (TWO) TIMES DAILY.  . [DISCONTINUED] torsemide (DEMADEX) 10 MG tablet TAKE 1 TABLET BY MOUTH EVERY DAY AS NEEDED   No facility-administered encounter medications on file as of 12/26/2019.   Activities of Daily Living In your present state of health, do you have any difficulty performing the following activities: 12/26/2019 05/17/2019  Hearing? N Y  Vision? N Y  Difficulty concentrating or making decisions? N N  Walking or climbing stairs? Y Y  Comment uses a walker -  Dressing or bathing? N N  Doing errands, shopping? N N  Preparing Food and eating ? N N  Using the Toilet? N N  In the past six months, have you accidently leaked urine? Y N  Comment  depends -  Do you have problems with loss of bowel control? Y N  Comment depends -  Managing your Medications? N N  Managing your Finances? N N  Housekeeping or managing your Housekeeping? Tempie Donning  Comment aide does housekeeping -  Some recent data might be hidden   Patient Care Team: Kathrene Alu, MD as PCP - General (Family Medicine) Fay Records, MD as PCP - Cardiology (Cardiology) Zadie Rhine Clent Demark, MD ([facility]) Wonda Horner, MD as Consulting Physician (Gastroenterology) Marzetta Board, DPM as Consulting Physician (Podiatry) Danice Goltz, MD as Consulting Physician (Ophthalmology)    Assessment:   This is a routine wellness examination for Rebecca Walter.  Exercise Activities and Dietary recommendations Current Exercise Habits: Home exercise routine, Type of exercise: walking, Time (Minutes): 15, Frequency (Times/Week): 1, Weekly Exercise (Minutes/Week): 15, Intensity: Mild, Exercise limited by: orthopedic condition(s);psychological condition(s);respiratory conditions(s)  Goals    . Exercise 3x per week (30 min per time)     Patient states its too cold to walk outside. I encouraged her to walk around her home.    . Weight (lb) < 180 lb (81.6 kg)      Fall Risk Fall Risk  12/26/2019 05/17/2019 02/14/2019 01/11/2019 12/29/2018  Falls in the past year? 0 _0 0  Number falls in past yr: - 0 1 1 -  Injury with Fall? - 0 1 1 -  Risk for fall due to : - History of fall(s);Impaired balance/gait - Other (Comment) -  Risk for fall due to: Comment - - -  just fell in her living room  -  Follow up - Education provided;Falls prevention discussed Falls prevention discussed Falls prevention discussed -   Is the patient's home free of loose throw rugs in walkways, pet beds, electrical cords, etc?   yes      Grab bars in the bathroom? yes      Handrails on the stairs?   yes  Adequate lighting?   yes  Patient rating of health (0-10) scale: 7  Depression Screen PHQ  2/9 Scores 12/26/2019 05/17/2019 02/14/2019 01/11/2019  PHQ - 2 Score 1 0 0 0  PHQ- 9 Score - - - -   *Patient stated she is feeling better on Zoloft*  Cognitive Function MMSE - Mini Mental State Exam 09/13/2012  Orientation to time 5  Orientation to Place 5  Registration 3  Attention/ Calculation 5  Recall 2  Language- name 2 objects 2  Language- repeat 1  Language- follow 3 step command 3  Language- read & follow direction 1  Write a sentence 1  Copy design 1  Total score 29   6CIT Screen 12/26/2019 05/17/2019  What Year? 0 points 0 points  What month? 0 points 0 points  What time? 0 points 0 points  Count back from 20 0 points 0 points  Months in reverse 0 points 0 points  Repeat phrase 0 points 0 points  Total Score 0 0   Immunization History  Administered Date(s) Administered  . Influenza Split 11/05/2011, 09/20/2012  . Influenza Whole 11/15/2007, 10/24/2009, 09/25/2010  . Influenza, High Dose Seasonal PF 09/07/2018  . Influenza,inj,Quad PF,6+ Mos 10/08/2014, 10/10/2015, 09/26/2017  . Influenza-Unspecified 10/19/2013, 10/03/2016, 10/26/2019  . Pneumococcal Conjugate-13 09/26/2017  . Pneumococcal Polysaccharide-23 10/20/2004  . Td 10/20/2002  . Tdap 11/21/2017   Screening Tests Health Maintenance  Topic Date Due  . OPHTHALMOLOGY EXAM  05/04/2019  . FOOT EXAM  12/30/2019  . HEMOGLOBIN A1C  01/20/2020  . TETANUS/TDAP  11/22/2027  . INFLUENZA VACCINE  Completed  . DEXA SCAN  Completed  . PNA vac Low Risk Adult  Completed   Cancer Screenings: Lung: Low Dose CT Chest recommended if Age 15-80 years, 30 pack-year currently smoking OR have quit w/in 15years. Patient does not qualify. Breast:  Up to date on Mammogram? Yes   Up to date of Bone Density/Dexa? Yes Colorectal: UTD  Additional Screenings: PNA Vaccine: Completed Flu Vaccine: Completed   Plan:  Continue to work on diet to reach goal weight of 180 and less. I know its too cold to walk outside, however you  can walk the perimeter of your apartment daily. I scheduled you an apt for diabetes with PCP on 01/23/2020 _0 :30am. Sign up for the Covid Vaccine!   I have personally reviewed and noted the following in the patient's chart:   . Medical and social history . Use of alcohol, tobacco or illicit drugs  . Current medications and supplements . Functional ability and status . Nutritional status . Physical activity . Advanced directives . List of other physicians . Hospitalizations, surgeries, and ER visits in previous 12 months . Vitals . Screenings to include cognitive, depression, and falls . Referrals and appointments  In addition, I have reviewed and discussed with patient certain preventive protocols, quality metrics, and best practice recommendations. A written personalized care plan for preventive services as well as general preventive health recommendations were provided to patient.  This visit was conducted virtually in the setting of the [place] pandemic.    Dorna Bloom, [place]  12/26/2019   I have reviewed this visit and agree with the documentation.   Amanda C. Shan Levans, MD PGY-3, [facility] 12/26/2019 9:40 PM

## 2020-01-03 ENCOUNTER — Other Ambulatory Visit: Payer: Self-pay

## 2020-01-03 MED ORDER — SERTRALINE HCL 100 MG PO TABS: 100.0000 mg | ORAL_TABLET | Freq: Every day | ORAL | 11 refills | Status: DC

## 2020-01-04 ENCOUNTER — Telehealth: Payer: Self-pay | Admitting: Family Medicine

## 2020-01-04 NOTE — Telephone Encounter (Signed)
Called Ms. Canterbury for our weekly check-in today.  She reports that she has gotten her new CPAP supplies and this is working well for her, but she is having difficulty going to sleep every night.  She continues to take trazodone), and Zoloft for her anxiety.  She has taken Tylenol PM with some success in the past but struggles with dry mouth and dry eyes.  I told her that the Benadryl component of Tylenol can make dry mouth and dry eyes worse and she says that she did not realize that.  We reviewed sleep hygiene techniques, but the patient was requesting for another medication to help her relax before bedtime.  I prescribed doxepin for this reason last year, but patient says that she was too nervous to try it at that time.  She still has some leftover, so I recommended that she try this medication.  I did warn her that it can cause dry mouth and dry eyes as well so she should stop it if this occurs.  I also gave her the 2 websites that she can look for COVID-19 vaccine registration slots.  She says that she will look into registering for a vaccine.  Rhona Fusilier C. Shan Levans, MD PGY-3, [facility] 01/04/2020 8:29 PM

## 2020-01-10 ENCOUNTER — Telehealth: Payer: Self-pay | Admitting: Family Medicine

## 2020-01-10 NOTE — Telephone Encounter (Signed)
**  After Hours/ Emergency Line Call**  Received a call to report that Rebecca Walter calling for concern of bright yellow urine.    Patient reports her urine is "bright yellow" and has been like that x2 days. Denies back pain. Denies dysuria, frequency, urgency. Denies hematuria. No fevers. Advised she be seen in clinic for possible UA and/or culture. Appointment with PCP made for 01/11/20. Red flags discussed.  Will forward to PCP.  Caroline More, DO PGY-3, [facility] 01/10/2020 11:31 PM

## 2020-01-10 NOTE — Progress Notes (Signed)
Virtual Visit via Telephone Note   This visit type was conducted due to national recommendations for restrictions regarding the COVID-19 Pandemic (e.g. social distancing) in an effort to limit this patient's exposure and mitigate transmission in our community.  Due to her co-morbid illnesses, this patient is at least at moderate risk for complications without adequate follow up.  This format is felt to be most appropriate for this patient at this time.  The patient did not have access to video technology/had technical difficulties with video requiring transitioning to audio format only (telephone).  All issues noted in this document were discussed and addressed.  No physical exam could be performed with this format.  Please refer to the patient's chart for her  consent to telehealth for [facility].   Date:  01/11/2020   ID:  Rebecca Walter, DOB Aug 21, 1941, MRN [id number]  Patient Location: Home Provider Location: Home  PCP:  Kathrene Alu, MD  Cardiologist:  Dorris Carnes, MD   Electrophysiologist:  None   Evaluation Performed:  Follow-Up Visit  Chief Complaint:  Follow-up (blurry vision) and Shortness of Breath    History of Present Illness:    Rebecca Walter is a 79 y.o. female with    Chronic Diastolic CHF  Valvular Heart Disease  Mild AI, mild MR, mod PI  Hypertension   Hx of intol to carvedilol, clonidine   Takes prn Amlodipine 2.5 mg in add'n to usual dose of 10 mg for elevated BP  Hyperlipidemia   Diabetes mellitus type 2   Anxiety   Hyponatremia  2/2 primary polydipsia   L vertebral artery stenosis  R subclavian stenosis   She was last seen by Dr. Harrington Challenger in 11/2019 for evaluation of transient blurry vision.  The patient was set up for a carotid US and event monitor.  The ultrasound showed minimal bilateral carotid plaque.  The monitor showed no significant arrhythmia.    Today she notes she has not had any further episodes of blurry vision.  She has  noted shortness of breath.  She really describes 2 types of shortness of breath.  She has 1 type of shortness of breath that seems to be related to anxiety.  She does "breathing exercises" with improved symptoms.  She also notes dyspnea with minimal activity.  This has been a more chronic symptom.  She sleeps on 3 pillows.  She uses CPAP at night.  She has not had leg swelling or weight changes.  She has not had syncope or chest pain.  She sees her primary care provider later today for dark urine color.    Past Medical History:  Diagnosis Date  . Anxiety   . Arthritis   . Chronic diastolic CHF (congestive heart failure) ([place])   . Diabetes mellitus   . GERD (gastroesophageal reflux disease)   . Gout   . Hyperlipidemia   . Hypertension   . Hypertensive heart disease with CHF (congestive heart failure) ([place])   . Mild aortic stenosis 06/2017  . Mild mitral regurgitation 06/2017  . Moderate pulmonary valve insufficiency 06/2017  . Osteopenia    Past Surgical History:  Procedure Laterality Date  . KIDNEY SURGERY    . KNEE SURGERY    . MENISECTOMY    . WRIST SURGERY       Current Meds  Medication Sig  . acetaminophen (TYLENOL) 325 MG tablet Take 325 mg by mouth every 8 (eight) hours as needed (pain).   Marland Kitchen allopurinol (ZYLOPRIM) 100 MG  tablet TAKE 2 TABLETS BY MOUTH EVERY DAY  . amLODipine (NORVASC) 10 MG tablet TAKE 1 TABLET BY MOUTH EVERY DAY  . amLODipine (NORVASC) 2.5 MG tablet Take 2.5 mg by mouth 2 (two) times daily as needed (elevated blood pressure).  Marland Kitchen amLODipine (NORVASC) 5 MG tablet Take 5 mg by mouth daily as needed (elevated blood pressure).  Marland Kitchen aspirin 81 MG chewable tablet Chew 1 tablet (81 mg total) by mouth daily.  . baclofen (LIORESAL) 10 MG tablet Take 10 mg by mouth 2 (two) times daily.  . Blood Glucose Monitoring Suppl (ACCU-CHEK AVIVA PLUS) w/Device KIT 1 Device by Does not apply route daily.  . busPIRone (BUSPAR) 15 MG tablet TAKE 1 TABLET (15 MG TOTAL) BY MOUTH 2  (TWO) TIMES DAILY.  Marland Kitchen Camphor-Eucalyptus-Menthol (VICKS VAPORUB EX) Place 1 application into both nostrils daily as needed (congestion).  . famotidine (PEPCID) 20 MG tablet Take 20 mg by mouth daily.  Marland Kitchen glucose blood (ACCU-CHEK AVIVA PLUS) test strip CHECK BLOOD SUGAR FIRST  THING IN THE MORNING BEFORE EATING, AFTER LUNCH, AND  AFTER DINNER TOTAL 3 TIMES  DAILY  . insulin aspart (NOVOLOG FLEXPEN) 100 UNIT/ML FlexPen Inject 9-18 Units into the skin See admin instructions. Inject 15 units subcutaneously daily with breakfast, inject 9 units with lunch as needed for CBG 180 or more, and inject 9 units with supper  . Insulin Glargine (LANTUS SOLOSTAR) 100 UNIT/ML Solostar Pen Inject 65 Units into the skin daily.  . Insulin Pen Needle (B-D ULTRAFINE III SHORT PEN) 31G X 8 MM MISC USE TO INJECT INSULIN 4 TIMES DAILY OR AS DIRECTED.  Marland Kitchen loratadine (CLARITIN) 10 MG tablet Take 10 mg by mouth daily.  Marland Kitchen lovastatin (MEVACOR) 40 MG tablet Take 1 tablet (40 mg total) by mouth daily with supper.  . metFORMIN (GLUCOPHAGE-XR) 500 MG 24 hr tablet Take 500 mg by mouth daily with breakfast.  . Multiple Vitamin (MULTIVITAMIN WITH MINERALS) TABS tablet Take 1 tablet by mouth daily.  . pantoprazole (PROTONIX) 40 MG tablet Take 1 tablet (40 mg total) by mouth 2 (two) times daily.  . sertraline (ZOLOFT) 100 MG tablet Take 1 tablet (100 mg total) by mouth daily.  . sertraline (ZOLOFT) 50 MG tablet Take 1 tablet (50 mg total) by mouth 2 (two) times daily. One dose in the afternoon and a second before bed.  . spironolactone (ALDACTONE) 25 MG tablet Take 1 tablet (25 mg total) by mouth daily.  Marland Kitchen torsemide (DEMADEX) 10 MG tablet TAKE 1 TABLET BY MOUTH EVERY DAY AS NEEDED  . traZODone (DESYREL) 50 MG tablet Take 1 tablet (50 mg total) by mouth at bedtime.  . valsartan (DIOVAN) 320 MG tablet Take 1 tablet (320 mg total) by mouth daily.  . [DISCONTINUED] amLODipine (NORVASC) 2.5 MG tablet TAKE 1 TABLET BY MOUTH TWICE A DAY  .  [DISCONTINUED] amLODipine (NORVASC) 5 MG tablet TAKE 1 TABLET BY MOUTH EVERYDAY AT BEDTIME  . [DISCONTINUED] LANTUS SOLOSTAR 100 UNIT/ML Solostar Pen INJECT 55 UNITS INTO THE SKIN DAILY. IN THE MORNING  . [DISCONTINUED] metFORMIN (GLUCOPHAGE-XR) 500 MG 24 hr tablet TAKE 1 TABLET TWICE A DAY BEFORE MEALS     Allergies:   Baclofen, Hctz [hydrochlorothiazide], Lotensin [benazepril], Victoza [liraglutide], Acyclovir and related, Beta adrenergic blockers, Losartan, and Other   Social History   Tobacco Use  . Smoking status: Never Smoker  . Smokeless tobacco: Never Used  Substance Use Topics  . Alcohol use: No  . Drug use: No  Family Hx: The patient's family history includes Cancer in her brother and sister; Diabetes in her maternal uncle; Heart disease in her brother and father; Hypertension in her father; Stroke in her father and mother.  ROS:   Please see the history of present illness.      Prior CV studies:   The following studies were reviewed today:  LT Cardiac Monitor 12/2019 Sinus rhythm 28 to 160 bpm  Average 59 bpm    Occasional PVCs, PAC  Rare burst NSVT   Longest 8 beats   Carotid US 12/12/2019 Summary: Right Carotid: Velocities in the right ICA are consistent with a 1-39% stenosis.                Non-hemodynamically significant plaque <50% noted in the CCA. Left Carotid: Velocities in the left ICA are consistent with a 1-39% stenosis. Vertebrals:  Right vertebral artery demonstrates antegrade flow. Left vertebral              artery demonstrates high resistant flow. Subclavians: Right subclavian artery was stenotic. Normal flow hemodynamics were              seen in the left subclavian artery.  Myoview 09/06/17  Nuclear stress EF: 67%.  Defect 1: There is a small defect of mild severity present in the mid anteroseptal and apical septal location.  This is a low risk study.  Findings consistent with ischemia. Low risk stress nuclear study with a small area of  apicoseptal ischemia and normal left ventricular regional and global systolic function. The true apex is spared. Findings may signify distal stenosis in a small LAD artery, but cannot exclude shifting breast attenuation artifact.  Echocardiogram 07/16/17 Mild LVH, EF 60-65, Gr 1 DD, mild AI, mild MR, mild LAE, mod PI, PASP 35 Trivial eff  Labs/Other Tests and Data Reviewed:    EKG:  No ECG reviewed.  Recent Labs: 03/26/2019: ALT 16 10/12/2019: BUN 29; Creatinine, Ser 1.03; Hemoglobin 12.7; Platelets 245; Potassium 4.4; Sodium 135; TSH 7.490   Recent Lipid Panel Lab Results  Component Value Date/Time   CHOL 154 10/16/2018 02:15 PM   TRIG 323 (H) 10/16/2018 02:15 PM   HDL 35 (L) 10/16/2018 02:15 PM   CHOLHDL 4.4 10/16/2018 02:15 PM   CHOLHDL 4.2 07/16/2017 04:02 AM   LDLCALC 54 10/16/2018 02:15 PM   LDLDIRECT 103 (H) 03/11/2010 09:27 PM    Wt Readings from Last 3 Encounters:  01/11/20 185 lb (83.9 kg)  12/26/19 186 lb (84.4 kg)  12/07/19 194 lb 3.2 oz (88.1 kg)     Objective:    Vital Signs:  BP (!) 161/63   Pulse 61   Ht 5' (1.524 m)   Wt 185 lb (83.9 kg)   LMP  (LMP Unknown)   BMI 36.13 kg/m    VITAL SIGNS:  reviewed GEN:  no acute distress RESPIRATORY:  No labored breathing NEURO:  Alert and oriented PSYCH:  Normal mood  ASSESSMENT & PLAN:    1. Transient vision disturbance She had an episode of blurry vision that she reported to Dr. Harrington Challenger at her last visit.  Carotid Dopplers demonstrated mild bilateral carotid plaque but no significant obstruction.  She also had an event monitor that did not demonstrate any significant arrhythmias.  She has not had any further symptoms.  Should she have recurrent symptoms in the future, we may need to refer her to neurology for further evaluation.  Continue aspirin.  2. Chronic diastolic CHF (congestive heart failure) ([place])  3. Shortness of breath She takes torsemide as needed.  Her weights have been stable and she has not had  lower extremity swelling.  However, she does note shortness of breath with minimal activities.  She is NYHA III.  It is difficult to assess her volume status via telemedicine.  She has a history of mild aortic insufficiency and mitral regurgitation and moderate pulmonic insufficiency by echocardiogram in 2018.  Her activity level has decreased somewhat since COVID-19 restrictions started.  Her dyspnea may all be related to deconditioning.  I have recommended that we obtain labs and a follow-up echocardiogram to further assess her shortness of breath.    -Obtain BMET, CBC, BNP  -If BNP significantly elevated, adjust torsemide  -Obtain follow-up echocardiogram  -Follow-up with Dr. Harrington Challenger or me in 8 weeks  4. Valvular heart disease As noted, she had mild aortic insufficiency and mitral regurgitation and moderate pulmonic insufficiency by echocardiogram in 2019.  She describes fairly significant shortness of breath.  Obtain follow-up echocardiogram as noted.  5. Essential hypertension Uncontrolled.  She has intolerances to multiple medications.  I have recommended starting hydralazine 10 mg 3 times a day.  Continue current dose of spironolactone, amlodipine and valsartan.    Time:   Today, I have spent 22 minutes with the patient with telehealth technology discussing the above problems.     Medication Adjustments/Labs and Tests Ordered: Current medicines are reviewed at length with the patient today.  Concerns regarding medicines are outlined above.   Tests Ordered: Orders Placed This Encounter  Procedures  . ECHOCARDIOGRAM COMPLETE    Medication Changes: Meds ordered this encounter  Medications  . hydrALAZINE (APRESOLINE) 10 MG tablet    Sig: Take 1 tablet (10 mg total) by mouth 3 (three) times daily.    Dispense:  270 tablet    Refill:  3    Follow Up:  In Person in 8 week(s)  Signed, Richardson Dopp, PA-C  01/11/2020 11:44 AM    [place]

## 2020-01-11 ENCOUNTER — Encounter: Payer: Self-pay | Admitting: Family Medicine

## 2020-01-11 ENCOUNTER — Other Ambulatory Visit: Payer: Self-pay

## 2020-01-11 ENCOUNTER — Ambulatory Visit (INDEPENDENT_AMBULATORY_CARE_PROVIDER_SITE_OTHER): Payer: Medicare Other | Admitting: Family Medicine

## 2020-01-11 ENCOUNTER — Telehealth (INDEPENDENT_AMBULATORY_CARE_PROVIDER_SITE_OTHER): Payer: Medicare Other | Admitting: Physician Assistant

## 2020-01-11 ENCOUNTER — Telehealth: Payer: Self-pay

## 2020-01-11 ENCOUNTER — Encounter: Payer: Self-pay | Admitting: Physician Assistant

## 2020-01-11 VITALS — BP 140/60 | HR 82 | Wt 191.6 lb

## 2020-01-11 VITALS — BP 161/63 | HR 61 | Ht 60.0 in | Wt 185.0 lb

## 2020-01-11 DIAGNOSIS — E1143 Type 2 diabetes mellitus with diabetic autonomic (poly)neuropathy: Secondary | ICD-10-CM | POA: Diagnosis not present

## 2020-01-11 DIAGNOSIS — Z794 Long term (current) use of insulin: Secondary | ICD-10-CM

## 2020-01-11 DIAGNOSIS — H539 Unspecified visual disturbance: Secondary | ICD-10-CM | POA: Diagnosis not present

## 2020-01-11 DIAGNOSIS — R82998 Other abnormal findings in urine: Secondary | ICD-10-CM | POA: Diagnosis not present

## 2020-01-11 DIAGNOSIS — R0602 Shortness of breath: Secondary | ICD-10-CM

## 2020-01-11 DIAGNOSIS — I5032 Chronic diastolic (congestive) heart failure: Secondary | ICD-10-CM | POA: Diagnosis not present

## 2020-01-11 DIAGNOSIS — F419 Anxiety disorder, unspecified: Secondary | ICD-10-CM

## 2020-01-11 DIAGNOSIS — R7989 Other specified abnormal findings of blood chemistry: Secondary | ICD-10-CM

## 2020-01-11 DIAGNOSIS — I38 Endocarditis, valve unspecified: Secondary | ICD-10-CM

## 2020-01-11 DIAGNOSIS — I10 Essential (primary) hypertension: Secondary | ICD-10-CM

## 2020-01-11 LAB — POCT GLYCOSYLATED HEMOGLOBIN (HGB A1C): HbA1c, POC (controlled diabetic range): 6.6 % (ref 0.0–7.0)

## 2020-01-11 LAB — POCT URINALYSIS DIP (MANUAL ENTRY)
Bilirubin, UA: NEGATIVE
Blood, UA: NEGATIVE
Glucose, UA: NEGATIVE mg/dL
Ketones, POC UA: NEGATIVE mg/dL
Nitrite, UA: NEGATIVE
Protein Ur, POC: NEGATIVE mg/dL
Spec Grav, UA: <=1.005 — AB (ref 1.010–1.025)
Urobilinogen, UA: 0.2 E.U./dL
pH, UA: 5.5 (ref 5.0–8.0)

## 2020-01-11 LAB — POCT UA - MICROSCOPIC ONLY

## 2020-01-11 MED ORDER — HYDRALAZINE HCL 10 MG PO TABS: 10.0000 mg | ORAL_TABLET | Freq: Three times a day (TID) | ORAL | 3 refills | Status: DC

## 2020-01-11 NOTE — Telephone Encounter (Signed)
Patient was seen by Richardson Dopp at [facility]. The visit was conducted via telephone. Kathlen Mody saw where patient is being seen today in our office. He is requesting the following labs to be drawn if possible.  BMET CBC BNP  They will be able to access results.

## 2020-01-11 NOTE — Progress Notes (Signed)
   Subjective:    Rebecca Walter - 79 y.o. female MRN [id number]  Date of birth: 1941/05/20  CC:  Rebecca Walter is here for abnormal appearance of urine.  HPI: No changes in fluid intake Bright yellow for the last few days No dysuria, frequency, or urgency Loose stools x 2 in the last few days More anxious in the last week due to concerns about a man she has been messaging on Facebook  Health Maintenance:  Health Maintenance Due  Topic Date Due  . OPHTHALMOLOGY EXAM  05/04/2019  . FOOT EXAM  12/30/2019    -  reports that she has never smoked. She has never used smokeless tobacco. - Review of Systems: Per HPI. - Past Medical History: Patient Active Problem List   Diagnosis Date Noted  . Obstructive sleep apnea 12/04/2019  . Hoarseness 11/27/2019  . Leg weakness, bilateral 07/04/2019  . Dehydration 03/26/2019  . Hyponatremia   . Diabetic autonomic neuropathy ([place]) 01/24/2019  . Generalized weakness 12/15/2018  . Chronically dry eyes, right 12/15/2018  . Hypertension 10/17/2018  . Elevated TSH 11/21/2017  . Hypertensive heart disease with CHF (congestive heart failure) ([place])   . Chronic diastolic CHF (congestive heart failure) ([place]) 07/15/2017  . Mild aortic stenosis 06/19/2017  . Mild mitral regurgitation 06/19/2017  . Moderate pulmonary valve insufficiency 06/19/2017  . Lumbar back pain with radiculopathy affecting right lower extremity 10/29/2016  . Vertigo 08/29/2013  . Diarrhea 01/12/2013  . Allergic rhinitis 03/22/2011  . INSOMNIA 09/16/2009  . GASTROPARESIS 08/12/2009  . Anxiety 10/18/2008  . DYSKINESIA, ESOPHAGUS 09/13/2007  . CARCINOMA, BASAL CELL 05/26/2007  . Diabetes mellitus, type II ([place]) 02/16/2007  . HLD (hyperlipidemia) 02/16/2007  . OBESITY, NOS 02/16/2007  . GASTROESOPHAGEAL REFLUX, NO ESOPHAGITIS 02/16/2007  . Osteoarthritis of both knees 02/16/2007  . Osteopenia after menopause 02/16/2007  . Gout 02/16/2007   - Medications: reviewed and  updated   Objective:   Physical Exam BP 140/60   Pulse 82   Wt 191 lb 9.6 oz (86.9 kg)   LMP  (LMP Unknown)   SpO2 97%   BMI 37.42 kg/m  Gen: NAD, alert, cooperative with exam, well-appearing CV: RRR, good S1/S2, no murmur, no edema Resp: CTABL, no wheezes, non-labored Abd: SNTND, BS present, no guarding or organomegaly, no CVA tenderness, no suprapubic tenderness Psych: good insight, alert and oriented, appropriate mood and affect    Assessment & Plan:   Anxiety Patient displays no urinary symptoms indicative of a UTI or nephrolithiasis.  UA was collected which was unremarkable.  Patient's concern about her urine appearance is likely a manifestation of her anxiety which has been brought on by recent stressors.  Advised patient that while it is good for her to have companionship, she should be very careful when conversing online and should avoid revealing any personal identifying information or financial information.  Patient was in agreement and expressed understanding with this.   Elevated TSH Has had subclinical hypothyroidism over the past year.  We will repeat TSH today.  Diabetes mellitus, type II ([place]) We will obtain hemoglobin A1c today.  Chronic diastolic CHF (congestive heart failure) ([facility]) We will obtain a CMP, CBC, and BNP at cardiology's request today since they will not be able to see the patient due to her appointment with me.    Rebecca Walter, M.D. 01/13/2020, 7:31 PM PGY-3, [place]

## 2020-01-11 NOTE — Patient Instructions (Signed)
It was nice seeing you today Ms. Parady!  Your urine appeared normal today, so you do not have an infection.  We will get several labs including your hemoglobin A1c, liver function tests, electrolytes, blood level, and a test to check your heart function.  If you have any questions or concerns, please feel free to call the clinic.   Be well,  Dr. Shan Levans

## 2020-01-11 NOTE — Patient Instructions (Addendum)
Medication Instructions:   Your physician has recommended you make the following change in your medication:   1) Start Hydralazine 10 mg, 1 tablet by mouth three times a day  *If you need a refill on your cardiac medications before your next appointment, please call your pharmacy*  Lab Work:  You will have labs drawn today: BMET, CBC, BNP (you will have these done at your primary care physicians office today when you go for your appointment)  If you have labs (blood work) drawn today and your tests are completely normal, you will receive your results only by: Marland Kitchen MyChart Message (if you have MyChart) OR . A paper copy in the mail If you have any lab test that is abnormal or we need to change your treatment, we will call you to review the results.  Testing/Procedures:  Your physician has requested that you have an echocardiogram on 01/29/20 at 11:35. Echocardiography is a painless test that uses sound waves to create images of your heart. It provides your doctor with information about the size and shape of your heart and how well your heart's chambers and valves are working. This procedure takes approximately one hour. There are no restrictions for this procedure.    Follow-Up: At [facility], you and your health needs are our priority.  As part of our continuing mission to provide you with exceptional heart care, we have created designated Provider Care Teams.  These Care Teams include your primary Cardiologist (physician) and Advanced Practice Providers (APPs -  Physician Assistants and Nurse Practitioners) who all work together to provide you with the care you need, when you need it.  Your next appointment:    On 03/11/20 at 12:15PM

## 2020-01-11 NOTE — Telephone Encounter (Signed)
Pt call ing back to talk about her torsemide. Please advise. Airelle Everding Kennon Holter, CMA

## 2020-01-12 LAB — COMPREHENSIVE METABOLIC PANEL
ALT: 21 IU/L (ref 0–32)
AST: 18 IU/L (ref 0–40)
Albumin/Globulin Ratio: 1.7 (ref 1.2–2.2)
Albumin: 4.6 g/dL (ref 3.7–4.7)
Alkaline Phosphatase: 55 IU/L (ref 39–117)
BUN/Creatinine Ratio: 37 — ABNORMAL HIGH (ref 12–28)
BUN: 40 mg/dL — ABNORMAL HIGH (ref 8–27)
Bilirubin Total: 0.2 mg/dL (ref 0.0–1.2)
CO2: 23 mmol/L (ref 20–29)
Calcium: 9.5 mg/dL (ref 8.7–10.3)
Chloride: 100 mmol/L (ref 96–106)
Creatinine, Ser: 1.09 mg/dL — ABNORMAL HIGH (ref 0.57–1.00)
GFR calc Af Amer: 55 mL/min/{1.73_m2} — ABNORMAL LOW (ref >59)
GFR calc non Af Amer: 48 mL/min/{1.73_m2} — ABNORMAL LOW (ref >59)
Globulin, Total: 2.7 g/dL (ref 1.5–4.5)
Glucose: 148 mg/dL — ABNORMAL HIGH (ref 65–99)
Potassium: 4.5 mmol/L (ref 3.5–5.2)
Sodium: 137 mmol/L (ref 134–144)
Total Protein: 7.3 g/dL (ref 6.0–8.5)

## 2020-01-12 LAB — CBC
Hematocrit: 35.3 % (ref 34.0–46.6)
Hemoglobin: 12.2 g/dL (ref 11.1–15.9)
MCH: 31.5 pg (ref 26.6–33.0)
MCHC: 34.6 g/dL (ref 31.5–35.7)
MCV: 91 fL (ref 79–97)
Platelets: 223 10*3/uL (ref 150–450)
RBC: 3.87 x10E6/uL (ref 3.77–5.28)
RDW: 13.6 % (ref 11.7–15.4)
WBC: 9.6 10*3/uL (ref 3.4–10.8)

## 2020-01-12 LAB — BRAIN NATRIURETIC PEPTIDE: BNP: 17.5 pg/mL (ref 0.0–100.0)

## 2020-01-12 LAB — TSH: TSH: 4.96 u[IU]/mL — ABNORMAL HIGH (ref 0.450–4.500)

## 2020-01-13 NOTE — Assessment & Plan Note (Signed)
Has had subclinical hypothyroidism over the past year.  We will repeat TSH today.

## 2020-01-13 NOTE — Assessment & Plan Note (Signed)
We will obtain a CMP, CBC, and BNP at cardiology's request today since they will not be able to see the patient due to her appointment with me.

## 2020-01-13 NOTE — Assessment & Plan Note (Signed)
We will obtain hemoglobin A1c today.

## 2020-01-13 NOTE — Assessment & Plan Note (Signed)
Patient displays no urinary symptoms indicative of a UTI or nephrolithiasis.  UA was collected which was unremarkable.  Patient's concern about her urine appearance is likely a manifestation of her anxiety which has been brought on by recent stressors.  Advised patient that while it is good for her to have companionship, she should be very careful when conversing online and should avoid revealing any personal identifying information or financial information.  Patient was in agreement and expressed understanding with this.

## 2020-01-14 ENCOUNTER — Other Ambulatory Visit: Payer: Self-pay | Admitting: *Deleted

## 2020-01-14 MED ORDER — TORSEMIDE 10 MG PO TABS: 10.0000 mg | ORAL_TABLET | Freq: Every day | ORAL | 0 refills | Status: DC | PRN

## 2020-01-16 ENCOUNTER — Telehealth: Payer: Self-pay | Admitting: *Deleted

## 2020-01-16 NOTE — Telephone Encounter (Signed)
-----   Message from Kathrene Alu, MD sent at 01/15/2020  8:43 AM EST ----- Would you call Ms. Penman to let her know that her lab results are stable and look reassuring?  Thank you!

## 2020-01-16 NOTE — Telephone Encounter (Signed)
Pt informed of below and she did say that Dr. Shan Levans is supposed to call me this week. Dinorah Masullo Zimmerman Rumple, CMA

## 2020-01-17 ENCOUNTER — Telehealth: Payer: Self-pay | Admitting: Family Medicine

## 2020-01-17 NOTE — Telephone Encounter (Signed)
I called Ms. Coletti for our weekly check-in.  She says that her urination has improved, but she experiences acute on chronic hoarseness as well as intermittent shortness of breath, which is also chronic.  She says that her sleep quality waxes and wanes.  She is interested in further work-up of her hoarseness.  We discussed that this may be due to allergies, but she is already taking Flonase and Claritin.  We agreed that she will call the ENT office that she previously saw in 2018, and if they see that she needs a referral from me, I will send that in.  We discussed how to take hydralazine since she has been taking 2 pills at once occasionally.  I also discussed that hydralazine may make her pulse a little higher, which she says it has been in the 70s and 80s recently while it usually is in the 7s and 60s.  I will plan to call Ms. Newingham next week as well.

## 2020-01-22 ENCOUNTER — Other Ambulatory Visit: Payer: Self-pay

## 2020-01-22 ENCOUNTER — Telehealth (INDEPENDENT_AMBULATORY_CARE_PROVIDER_SITE_OTHER): Payer: Medicare Other | Admitting: Family Medicine

## 2020-01-22 DIAGNOSIS — G4700 Insomnia, unspecified: Secondary | ICD-10-CM

## 2020-01-22 MED ORDER — ROPINIROLE HCL 0.25 MG PO TABS: 0.2500 mg | ORAL_TABLET | Freq: Every day | ORAL | 2 refills | Status: DC

## 2020-01-23 ENCOUNTER — Ambulatory Visit: Payer: Medicare Other | Admitting: Family Medicine

## 2020-01-23 DIAGNOSIS — G2581 Restless legs syndrome: Secondary | ICD-10-CM | POA: Insufficient documentation

## 2020-01-23 NOTE — Assessment & Plan Note (Signed)
Acute on chronic.  Sleep hygiene was again reviewed with the patient to see if she can incorporate any more of these principles.  She was also encouraged to try melatonin.  She is taking many medications, several of which are psychotropic.  Due to her age and polypharmacy, I would like to avoid prescribing more medications and especially any medications on the beers list.  We will however try Requip 0.25 mg nightly to see if this helps her possible restless leg syndrome.  I will check in again with the patient next week.

## 2020-01-23 NOTE — Progress Notes (Signed)
[facility] Telemedicine Visit  Patient consented to have virtual visit. Method of visit: Telephone  Encounter participants: Patient: Rebecca Walter - located at home Provider: Kathrene Alu - located at [facility] Others (if applicable): none  Chief Complaint: Difficulty going to sleep  HPI:  Patient reports acute on chronic difficulties going to sleep each night.  Says that her feet twitch and she feels like her brain will not slow down.  She has tried sleep hygiene measures that we have discussed previously with limited effectiveness.  She would like to try medication to help with her difficulties going to sleep.  She feels that overall her anxiety is well controlled on Zoloft and BuSpar.  ROS: per HPI  Pertinent PMHx: Anxiety, CHF, type 2 diabetes  Exam:  Respiratory: No cough or shortness of breath during our conversation  Assessment/Plan:  INSOMNIA Acute on chronic.  Sleep hygiene was again reviewed with the patient to see if she can incorporate any more of these principles.  She was also encouraged to try melatonin.  She is taking many medications, several of which are psychotropic.  Due to her age and polypharmacy, I would like to avoid prescribing more medications and especially any medications on the beers list.  We will however try Requip 0.25 mg nightly to see if this helps her possible restless leg syndrome.  I will check in again with the patient next week.    Time spent during visit with patient: 13 minutes

## 2020-01-25 ENCOUNTER — Other Ambulatory Visit: Payer: Self-pay | Admitting: Family Medicine

## 2020-01-25 ENCOUNTER — Telehealth: Payer: Self-pay | Admitting: *Deleted

## 2020-01-25 MED ORDER — LANTUS SOLOSTAR 100 UNIT/ML ~~LOC~~ SOPN: 65.0000 [IU] | PEN_INJECTOR | Freq: Every day | SUBCUTANEOUS | 6 refills | Status: DC

## 2020-01-25 NOTE — Telephone Encounter (Signed)
Refilled. Thanks!

## 2020-01-29 ENCOUNTER — Other Ambulatory Visit: Payer: Self-pay

## 2020-01-29 ENCOUNTER — Encounter: Payer: Self-pay | Admitting: Physician Assistant

## 2020-01-29 ENCOUNTER — Ambulatory Visit (HOSPITAL_COMMUNITY): Payer: Medicare Other | Attending: Cardiology

## 2020-01-29 DIAGNOSIS — R0602 Shortness of breath: Secondary | ICD-10-CM | POA: Diagnosis not present

## 2020-01-29 DIAGNOSIS — I5032 Chronic diastolic (congestive) heart failure: Secondary | ICD-10-CM | POA: Diagnosis not present

## 2020-01-30 ENCOUNTER — Other Ambulatory Visit: Payer: Self-pay | Admitting: *Deleted

## 2020-01-30 ENCOUNTER — Telehealth: Payer: Self-pay | Admitting: Family Medicine

## 2020-01-30 MED ORDER — SERTRALINE HCL 50 MG PO TABS: 50.0000 mg | ORAL_TABLET | Freq: Two times a day (BID) | ORAL | 3 refills | Status: DC

## 2020-01-30 NOTE — Telephone Encounter (Signed)
Pt called to schedule a virtual appointment with Dr. Shan Levans to discuss having random nose bleeds. The appointment is 02/04/20 but she would like to ask Dr. Shan Levans to call her before then if possible.   The best call back is [phone]

## 2020-02-01 NOTE — Telephone Encounter (Signed)
Returned Rebecca Walter's call.  She was on the other line, so we agreed to talk again at her upcoming virtual appointment.

## 2020-02-04 ENCOUNTER — Other Ambulatory Visit: Payer: Self-pay

## 2020-02-04 ENCOUNTER — Encounter: Payer: Self-pay | Admitting: Family Medicine

## 2020-02-04 ENCOUNTER — Telehealth (INDEPENDENT_AMBULATORY_CARE_PROVIDER_SITE_OTHER): Payer: Medicare Other | Admitting: Family Medicine

## 2020-02-04 VITALS — BP 161/64 | HR 63 | Wt 183.0 lb

## 2020-02-04 DIAGNOSIS — G2581 Restless legs syndrome: Secondary | ICD-10-CM | POA: Diagnosis not present

## 2020-02-04 DIAGNOSIS — R040 Epistaxis: Secondary | ICD-10-CM | POA: Diagnosis not present

## 2020-02-04 DIAGNOSIS — I10 Essential (primary) hypertension: Secondary | ICD-10-CM

## 2020-02-04 HISTORY — DX: Epistaxis: R04.0

## 2020-02-04 NOTE — Assessment & Plan Note (Signed)
Counseled patient that amlodipine has a maximum dose of 10 mg daily, so 12.5 mg likely is not having more of an effect than the 10 mg dose would.  Patient understands this but wants to continue taking 12.5 mg daily.  We decided to increase her hydralazine from 1 to 2 pills 3 times a day, which would put her at 20 mg 3 times daily.  If she does not experience any orthostatic symptoms or presyncope and notes an improvement in her blood pressure, we will change the hydralazine dose to 25 mg 3 times daily.

## 2020-02-04 NOTE — Assessment & Plan Note (Signed)
Reassured patient that epistaxis is not concerning if it does not worsen over time and if episodes resolve quickly.  Encourage patient to use Vaseline to moisturize the mucosa of her nose, avoid rubbing or blowing her nose if she has had a recent nosebleed, and use a humidifier in the home.

## 2020-02-04 NOTE — Assessment & Plan Note (Signed)
Will increase Requip to 0.5 mg nightly.  Patient will double up on the 0.25 mg dose currently.  Warned patient that Requip may not solve her difficulties with sleep, but we can slowly titrate it up to see if this has any effect.

## 2020-02-04 NOTE — Progress Notes (Signed)
[place] Telemedicine Visit  Patient consented to have virtual visit. Method of visit: Telephone  Encounter participants: Patient: Rebecca Walter - located at home Provider: Kathrene Alu - located at [facility] Others (if applicable): none  Chief Complaint: blood pressure, epistaxis, and sleep follow-up  HPI:  Blood pressure Systolic [id number] with highest diastolic of 72.  Continues to take valsartan 320 mg daily, hydralazine 10 mg 3 times daily, amlodipine 12.5 mg daily, spironolactone 25 mg daily.  Has not taken torsemide because she has not had any edema recently.  Denies blurred vision, headache, dizziness.  Sleep difficulty Has started Requip 0.25 mg once nightly but has not noted an improvement yet.  Continues to have restless feet at the beginning of the night.  Epistaxis We will have infrequent, short-lived epistaxis that she thinks is triggered from blowing her nose and dry air.  Her epistaxis resolves quickly after applying pressure under the nasal bone.  ROS: per HPI  Pertinent PMHx: Hypertension, sleep difficulty, diastolic CHF  Exam:  Respiratory: No shortness of breath or cough during conversation  Assessment/Plan:  Hypertension Counseled patient that amlodipine has a maximum dose of 10 mg daily, so 12.5 mg likely is not having more of an effect than the 10 mg dose would.  Patient understands this but wants to continue taking 12.5 mg daily.  We decided to increase her hydralazine from 1 to 2 pills 3 times a day, which would put her at 20 mg 3 times daily.  If she does not experience any orthostatic symptoms or presyncope and notes an improvement in her blood pressure, we will change the hydralazine dose to 25 mg 3 times daily.  Restless leg syndrome Will increase Requip to 0.5 mg nightly.  Patient will double up on the 0.25 mg dose currently.  Warned patient that Requip may not solve her difficulties with sleep, but we can slowly titrate it up  to see if this has any effect.  Epistaxis Reassured patient that epistaxis is not concerning if it does not worsen over time and if episodes resolve quickly.  Encourage patient to use Vaseline to moisturize the mucosa of her nose, avoid rubbing or blowing her nose if she has had a recent nosebleed, and use a humidifier in the home.   Time spent during visit with patient: 18 minutes

## 2020-02-06 ENCOUNTER — Other Ambulatory Visit: Payer: Self-pay | Admitting: Family Medicine

## 2020-02-06 ENCOUNTER — Other Ambulatory Visit: Payer: Self-pay | Admitting: *Deleted

## 2020-02-06 MED ORDER — BACLOFEN 10 MG PO TABS: 10.0000 mg | ORAL_TABLET | Freq: Two times a day (BID) | ORAL | 2 refills | Status: DC

## 2020-02-13 ENCOUNTER — Telehealth: Payer: Medicare Other | Admitting: Family Medicine

## 2020-02-14 ENCOUNTER — Other Ambulatory Visit: Payer: Self-pay

## 2020-02-14 ENCOUNTER — Telehealth (INDEPENDENT_AMBULATORY_CARE_PROVIDER_SITE_OTHER): Payer: Medicare Other | Admitting: Family Medicine

## 2020-02-14 DIAGNOSIS — R21 Rash and other nonspecific skin eruption: Secondary | ICD-10-CM | POA: Diagnosis not present

## 2020-02-14 DIAGNOSIS — B372 Candidiasis of skin and nail: Secondary | ICD-10-CM

## 2020-02-14 DIAGNOSIS — I10 Essential (primary) hypertension: Secondary | ICD-10-CM

## 2020-02-14 HISTORY — DX: Candidiasis of skin and nail: B37.2

## 2020-02-14 MED ORDER — CLOTRIMAZOLE 1 % EX CREA: 1.0000 "application " | TOPICAL_CREAM | Freq: Two times a day (BID) | CUTANEOUS | 0 refills | Status: DC

## 2020-02-14 MED ORDER — HYDRALAZINE HCL 25 MG PO TABS: 25.0000 mg | ORAL_TABLET | Freq: Three times a day (TID) | ORAL | 3 refills | Status: DC

## 2020-02-14 MED ORDER — HYDROCORTISONE 2.5 % EX CREA: TOPICAL_CREAM | Freq: Two times a day (BID) | CUTANEOUS | 0 refills | Status: DC

## 2020-02-14 NOTE — Assessment & Plan Note (Signed)
We will increase patient's dose to hydralazine 25 mg 3 times daily since patient was told giving the increased dose well.  Patient was counseled on letting me know if she develops any symptoms of orthostatic hypotension.

## 2020-02-14 NOTE — Progress Notes (Signed)
[place] Telemedicine Visit  Patient consented to have virtual visit. Method of visit: Telephone  Encounter participants: Patient: Rebecca Walter - located at home Provider: Kathrene Alu - located at [facility] Others (if applicable): none  Chief Complaint: hypertension follow up, rash  HPI:  Hypertension Reports that she has tolerated the increased dose of hydralazine 20 mg 3 times daily well.  She says that her systolic BP has been [id number] while her diastolic has been around 65.  Her pulse has been in the 70s.  She has not experienced any dizziness or lightheadedness.  She is interested in switching her dose from hydralazine 10 mg 3 times daily to hydralazine 25 mg 3 times daily since she is currently doubling the 10 mg tablets.  Rash Notes that during the night, her neck is often wet from sweat.  She uses her CPAP and thinks that sweat collects in the folds of her neck.  She noticed redness and some bumps in the area recently, and it has been itchy.  She had a similar rash under her breasts few years ago.  ROS: per HPI  Pertinent PMHx: Hypertension, anxiety, sleep apnea  Exam:  Respiratory: No cough or shortness of breath heard on exam  Assessment/Plan:  Hypertension We will increase patient's dose to hydralazine 25 mg 3 times daily since patient was told giving the increased dose well.  Patient was counseled on letting me know if she develops any symptoms of orthostatic hypotension.  Candidiasis of skin Likely either candidal infection or contact dermatitis due to moisture in patients skin folds.  Will prescribe clotrimazole cream and hydrocortisone cream for relief.    Time spent during visit with patient: 12 minutes

## 2020-02-14 NOTE — Assessment & Plan Note (Signed)
Likely either candidal infection or contact dermatitis due to moisture in patients skin folds.  Will prescribe clotrimazole cream and hydrocortisone cream for relief.

## 2020-02-18 ENCOUNTER — Other Ambulatory Visit: Payer: Self-pay | Admitting: Family Medicine

## 2020-02-19 ENCOUNTER — Other Ambulatory Visit: Payer: Self-pay | Admitting: *Deleted

## 2020-02-20 MED ORDER — PANTOPRAZOLE SODIUM 40 MG PO TBEC: 40.0000 mg | DELAYED_RELEASE_TABLET | Freq: Two times a day (BID) | ORAL | 3 refills | Status: DC

## 2020-02-21 ENCOUNTER — Telehealth (INDEPENDENT_AMBULATORY_CARE_PROVIDER_SITE_OTHER): Payer: Medicare Other | Admitting: Family Medicine

## 2020-02-21 ENCOUNTER — Other Ambulatory Visit: Payer: Self-pay

## 2020-02-21 DIAGNOSIS — F419 Anxiety disorder, unspecified: Secondary | ICD-10-CM | POA: Diagnosis not present

## 2020-02-21 DIAGNOSIS — I10 Essential (primary) hypertension: Secondary | ICD-10-CM

## 2020-02-21 NOTE — Progress Notes (Signed)
[place] Telemedicine Visit  Patient consented to have virtual visit. Method of visit: Telephone  Encounter participants: Patient: Rebecca Walter - located at home Provider: Kathrene Alu - located at [facility] Others (if applicable): none  Chief Complaint: Anxiety and hypertension  HPI:  Anxiety Patient notes that she will occasionally experience moments where she feels like her throat is tightening up.  She feels short of breath at these times.  She thinks that this is connected to her anxiety, and she has tried deep breathing techniques to reduce her symptoms.  These have been quite effective.  These episodes occur randomly and are nonexertional.  She denies any new weight gain or edema.  She denies difficulties swallowing.  She is wondering if I know of any other medication we could try to help reduce her anxiety.  Hypertension Patient takes her blood pressure multiple times per day and notes that it continues to be in the [id number] systolic and around the 0000000 diastolic.  She has been taking hydralazine 25 mg 3 times daily and does not think that it has had any effect on her blood pressure.  She denies any lightheadedness or orthostatic symptoms.  ROS: per HPI  Pertinent PMHx: anxiety, hypertension, HFpEF, T2DM  Exam:  Respiratory: No shortness of breath or cough during conversation today.  Patient able to speak in full sentences without difficulty.  Assessment/Plan:  Anxiety We discussed that even with high doses of Zoloft and aspirin, patient is likely to have some symptoms of anxiety because this is part of her personality.  I congratulated her on her efforts to do breathing exercises and her ability to recognize when symptoms may be due to her anxiety.  We decided that she may benefit from having a therapist since patients tend to do the best when they are on antidepressant and seeing a therapist at the same time.  I gave her the website of psychology  today.com which will allow her to filter for psychologist that would suit her needs and insurance.  We will follow-up in 1 week.  Hypertension Patient's blood pressure does not seem to be responding to hydralazine.  I would like to either reduce or remove this medication if she is not getting any benefit from it to reduce polypharmacy and possible side effects.  We agreed to watch her blood pressure for 1 more week before deciding whether to change this medication.    Time spent during visit with patient: 15 minutes

## 2020-02-21 NOTE — Assessment & Plan Note (Signed)
We discussed that even with high doses of Zoloft and aspirin, patient is likely to have some symptoms of anxiety because this is part of her personality.  I congratulated her on her efforts to do breathing exercises and her ability to recognize when symptoms may be due to her anxiety.  We decided that she may benefit from having a therapist since patients tend to do the best when they are on antidepressant and seeing a therapist at the same time.  I gave her the website of psychology today.com which will allow her to filter for psychologist that would suit her needs and insurance.  We will follow-up in 1 week.

## 2020-02-21 NOTE — Assessment & Plan Note (Signed)
Patient's blood pressure does not seem to be responding to hydralazine.  I would like to either reduce or remove this medication if she is not getting any benefit from it to reduce polypharmacy and possible side effects.  We agreed to watch her blood pressure for 1 more week before deciding whether to change this medication.

## 2020-02-22 ENCOUNTER — Ambulatory Visit: Payer: Medicare Other | Attending: Internal Medicine

## 2020-02-22 DIAGNOSIS — Z23 Encounter for immunization: Secondary | ICD-10-CM | POA: Insufficient documentation

## 2020-02-22 NOTE — Progress Notes (Signed)
   Covid-19 Vaccination Clinic  Name:  Rebecca Walter    MRN: [id number] DOB: 05/19/1941  02/22/2020  [name] was observed post Covid-19 immunization for 15 minutes without incident. She was provided with Vaccine Information Sheet and instruction to access the V-Safe system.   [name] was instructed to call 911 with any severe reactions post vaccine: Marland Kitchen Difficulty breathing  . Swelling of face and throat  . A fast heartbeat  . A bad rash all over body  . Dizziness and weakness   Immunizations Administered    Name Date Dose VIS Date Route   Pfizer COVID-19 Vaccine 02/22/2020  3:28 PM 0.3 mL 11/30/2019 Intramuscular   Manufacturer: [place]   Lot: [id number]   [place]: [id number]

## 2020-02-27 ENCOUNTER — Other Ambulatory Visit: Payer: Self-pay | Admitting: Family Medicine

## 2020-02-27 ENCOUNTER — Telehealth (INDEPENDENT_AMBULATORY_CARE_PROVIDER_SITE_OTHER): Payer: Medicare Other | Admitting: Family Medicine

## 2020-02-27 ENCOUNTER — Other Ambulatory Visit: Payer: Self-pay

## 2020-02-27 ENCOUNTER — Encounter: Payer: Self-pay | Admitting: Family Medicine

## 2020-02-27 DIAGNOSIS — I10 Essential (primary) hypertension: Secondary | ICD-10-CM | POA: Diagnosis not present

## 2020-02-27 NOTE — Assessment & Plan Note (Signed)
After discussion, we decided to stop her hydralazine since it is a 3 times daily medication and does not seem to be improving patient's blood pressure.  I reiterated to her that a wide pulse pressure, with a higher than average systolic and lower than average diastolic measurement, is often seen in patients as they get older since their vasculature is less compliant.  I reassured her that blood pressure readings like hers do not always warrant further intervention.  She will continue her other medications, and we will not add any new antihypertensives at this time.

## 2020-02-27 NOTE — Progress Notes (Signed)
[place] Telemedicine Visit  Patient consented to have virtual visit. Method of visit: Telephone  Encounter participants: Patient: Rebecca Walter - located at home Provider: Kathrene Alu - located at [facility] Others (if applicable): none  Chief Complaint: hypertension follow up  HPI:  Hypertension Patient reports that her blood pressures continue to be around 150/70 and have not seemed to respond to hydralazine.  She denies lightheadedness, dizziness, headaches, vision changes.  She is interested in trying something else for her blood pressure if possible.  She continues to take valsartan, spironolactone, torsemide, and amlodipine.  She recently got [employer] vaccine and is doing well.  She is excited to have received this vaccine.  She says that her anxiety is well controlled currently.  She is having some difficulties obtaining blood on her CBG checks but has had outliers in her latest CBGs..  ROS: per HPI  Pertinent PMHx: Hypertension, anxiety, type 2 diabetes, well controlled  Exam:  Respiratory: No shortness of breath or cough heard during our conversation  Assessment/Plan:  Hypertension After discussion, we decided to stop her hydralazine since it is a 3 times daily medication and does not seem to be improving patient's blood pressure.  I reiterated to her that a wide pulse pressure, with a higher than average systolic and lower than average diastolic measurement, is often seen in patients as they get older since their vasculature is less compliant.  I reassured her that blood pressure readings like hers do not always warrant further intervention.  She will continue her other medications, and we will not add any new antihypertensives at this time.    Time spent during visit with patient: 20 minutes

## 2020-02-29 ENCOUNTER — Telehealth: Payer: Self-pay

## 2020-02-29 NOTE — Telephone Encounter (Signed)
Patient calls nurse line stating she had 3 episodes of diarrhea last night. Patient stated she ate some cabbage, nothing out of the ordinary, she did have the same dish the night before with no issues. Patient stated she been tired and weak all day today. Patient has not had any more diarrhea, she did take an imodium earlier. Patient stated her BP has been in normal limits. Patient denies any fever or chills. Patient advised she may have a touch of a "bug." Patient stated to only take imodium if she continues to have diarrhea and to stay plenty hydrated. I will forward to PCP for any further recommendations. ED precautions given to patient.

## 2020-03-03 ENCOUNTER — Other Ambulatory Visit: Payer: Self-pay | Admitting: Family Medicine

## 2020-03-03 ENCOUNTER — Other Ambulatory Visit: Payer: Self-pay | Admitting: *Deleted

## 2020-03-03 MED ORDER — VALSARTAN 320 MG PO TABS: 320.0000 mg | ORAL_TABLET | Freq: Every day | ORAL | 11 refills | Status: DC

## 2020-03-05 ENCOUNTER — Telehealth (INDEPENDENT_AMBULATORY_CARE_PROVIDER_SITE_OTHER): Payer: Medicare Other | Admitting: Family Medicine

## 2020-03-05 ENCOUNTER — Other Ambulatory Visit: Payer: Self-pay

## 2020-03-05 ENCOUNTER — Other Ambulatory Visit: Payer: Self-pay | Admitting: Family Medicine

## 2020-03-05 ENCOUNTER — Other Ambulatory Visit: Payer: Self-pay | Admitting: *Deleted

## 2020-03-05 DIAGNOSIS — F419 Anxiety disorder, unspecified: Secondary | ICD-10-CM

## 2020-03-05 DIAGNOSIS — K591 Functional diarrhea: Secondary | ICD-10-CM | POA: Diagnosis not present

## 2020-03-05 MED ORDER — SERTRALINE HCL 100 MG PO TABS: 100.0000 mg | ORAL_TABLET | Freq: Two times a day (BID) | ORAL | 11 refills | Status: DC

## 2020-03-05 MED ORDER — TORSEMIDE 10 MG PO TABS: 10.0000 mg | ORAL_TABLET | Freq: Every day | ORAL | 11 refills | Status: DC | PRN

## 2020-03-05 MED ORDER — SERTRALINE HCL 100 MG PO TABS: 100.0000 mg | ORAL_TABLET | Freq: Every day | ORAL | 11 refills | Status: DC

## 2020-03-05 NOTE — Assessment & Plan Note (Addendum)
Reassured Ms. Jose that 2 episodes of diarrhea in 1 week is not necessarily abnormal, and that should not be dangerous to her as long as she is able to eat and drink like normal.  This does not appear to be infectious.  Encouraged her to continue using Imodium as needed and if her symptoms do not improve or if they worsen over the next few weeks, she should come in for an in person appointment so that we can do a full exam and get labs if needed.

## 2020-03-05 NOTE — Assessment & Plan Note (Addendum)
Counseled patient that I do not feel that adding another medication for her anxiety is warranted at this time, since she is already on Zoloft and BuSpar.  We discussed the possibility of switching her antidepressant to Celexa since she has tried Prozac, Lexapro, and Effexor in the past, but since Zoloft has worked well for her, we will continue with this medication.  It is also possible that diarrhea is from Zoloft, but less likely since she has been on Zoloft for a year without any significant diarrhea until recently.  I encouraged Rebecca Walter to research a psychologist who can on psychologytoday.com as we discussed previously since patients with anxiety have been shown to do best when they are treated with therapy and medications.

## 2020-03-05 NOTE — Progress Notes (Addendum)
Re[facility] Telemedicine Visit  Patient consented to have virtual visit. Method of visit: Telephone  Encounter participants: Patient: Rebecca Walter - located at home Provider: Kathrene Walter - located at [facility] Others (if applicable): none  Chief Complaint: diarrhea, anxiety  HPI:  Diarrhea, anxiety Patient reports that she has had 2 episodes of loose stools, one on Thursday night and 1 last night.  She says that around Thursday, she also felt weak, which self resolved but returned last night.  She also feels like her throat closes up when she feels weak.  She has been eating, drinking, and urinating normally.  She has not eaten anything out of that norm for her.  Her diarrhea has responded well to Imodium both times.  She thinks that this could be due to her anxiety and would like to try increasing her Zoloft dose if possible.  ROS: per HPI  Pertinent PMHx: anxiety, episodic diarrhea, CHF, hypertension  Exam:  Respiratory: No shortness of breath or cough during our conversation  Assessment/Plan:  Diarrhea Reassured Rebecca Walter that 2 episodes of diarrhea in 1 week is not necessarily abnormal, and that should not be dangerous to her as long as she is able to eat and drink like normal.  This does not appear to be infectious.  Encouraged her to continue using Imodium as needed and if her symptoms do not improve or if they worsen over the next few weeks, she should come in for an in person appointment so that we can do a full exam and get labs if needed.  Anxiety Counseled patient that I do not feel that adding another medication for her anxiety is warranted at this time, since she is already on Zoloft and BuSpar.  We discussed the possibility of switching her antidepressant to Celexa since she has tried Prozac, Lexapro, and Effexor in the past, but since Zoloft has worked well for her, we will continue with this medication.  It is also possible that diarrhea is  from Zoloft, but less likely since she has been on Zoloft for a year without any significant diarrhea until recently.  I encouraged Rebecca Walter to research a psychologist who can on psychologytoday.com as we discussed previously since patients with anxiety have been shown to do best when they are treated with therapy and medications.    Time spent during visit with patient: 15 minutes

## 2020-03-11 ENCOUNTER — Other Ambulatory Visit: Payer: Self-pay

## 2020-03-11 ENCOUNTER — Ambulatory Visit: Payer: Medicare Other | Admitting: Physician Assistant

## 2020-03-11 ENCOUNTER — Encounter: Payer: Self-pay | Admitting: Podiatry

## 2020-03-11 ENCOUNTER — Ambulatory Visit: Payer: Medicare Other | Admitting: Podiatry

## 2020-03-11 VITALS — Temp 97.2°F

## 2020-03-11 DIAGNOSIS — B351 Tinea unguium: Secondary | ICD-10-CM | POA: Diagnosis not present

## 2020-03-11 DIAGNOSIS — E1143 Type 2 diabetes mellitus with diabetic autonomic (poly)neuropathy: Secondary | ICD-10-CM

## 2020-03-11 DIAGNOSIS — M2041 Other hammer toe(s) (acquired), right foot: Secondary | ICD-10-CM | POA: Diagnosis not present

## 2020-03-11 DIAGNOSIS — M79675 Pain in left toe(s): Secondary | ICD-10-CM | POA: Diagnosis not present

## 2020-03-11 DIAGNOSIS — E119 Type 2 diabetes mellitus without complications: Secondary | ICD-10-CM

## 2020-03-11 DIAGNOSIS — M2042 Other hammer toe(s) (acquired), left foot: Secondary | ICD-10-CM

## 2020-03-11 DIAGNOSIS — Z794 Long term (current) use of insulin: Secondary | ICD-10-CM

## 2020-03-11 DIAGNOSIS — M79674 Pain in right toe(s): Secondary | ICD-10-CM | POA: Diagnosis not present

## 2020-03-11 NOTE — Progress Notes (Deleted)
Cardiology Office Note:    Date:  03/11/2020   ID:  Rebecca Walter, DOB 12-Jan-1941, MRN [id number]  PCP:  Kathrene Alu, MD  Cardiologist:  Dorris Carnes, MD *** Electrophysiologist:  None   Referring MD: Kathrene Alu, MD   Chief Complaint:  No chief complaint on file.    Patient Profile:    Rebecca Walter is a 79 y.o. female with:  ***  Chronic Diastolic CHF  Valvular Heart Disease ? Mild AI, mild MR, mod PI  Hypertension  ? Hx of intol to carvedilol, clonidine  ? 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 The true apex is spared. Findings may signify distal stenosis in a small LAD artery, but cannot exclude shifting breast attenuation artifact.  Echocardiogram 07/16/17 Mild LVH, EF 60-65, Gr 1 DD, mild AI, mild MR, mild LAE, mod PI, PASP 35 Trivial eff  History of Present Illness:    Ms. Holling was last seen via Telemedicine by me in 12/2019.  She noted shortness of breath.  I had her get a follow up echocardiogram that demonstrated normal ejection fraction.  A BNP was normal.  The DICTATELATER SmartLink is not supported in this context. ***   Past Medical History:  Diagnosis Date  . Anxiety   . Arthritis   . Chronic diastolic CHF (congestive heart failure) (HCC)    Echocardiogram 01/2020: EF 70-75, no RWMA, Gr 1 DD, normal RVSF, RVSP 45.8, mild to mod LAE, trivial MR, mild PI  . Diabetes mellitus   . GERD (gastroesophageal reflux disease)   . Gout   . Hyperlipidemia   . Hypertension   . Hypertensive heart disease with CHF (congestive heart failure) ([name])   . Mild aortic stenosis 06/2017  . Mild mitral regurgitation 06/2017  . Moderate pulmonary valve insufficiency 06/2017  . Osteopenia     Current Medications: No outpatient medications have been marked as taking for the 03/12/20 encounter (Appointment) with Richardson Dopp T, PA-C.     Allergies:   Baclofen, Hctz [hydrochlorothiazide], Lotensin [benazepril], Victoza [liraglutide], Acyclovir and related, Beta adrenergic blockers, Losartan, and Other   Social History   Tobacco Use  . Smoking status: Never Smoker  . Smokeless tobacco: Never Used  Substance Use Topics  . Alcohol use: No  . Drug use: No     Family Hx: The patient's family history includes Cancer in her brother and sister; Diabetes in her maternal uncle; Heart disease in her brother and father; Hypertension in her father; Stroke in her father and mother.  ROS   EKGs/Labs/Other Test Reviewed:    EKG:  EKG is *** ordered today.  The ekg ordered today demonstrates ***  Recent Labs: 01/11/2020: ALT 21; BNP 17.5; BUN 40; Creatinine, Ser 1.09; Hemoglobin 12.2; Platelets  223; Potassium 4.5; Sodium 137; TSH 4.960   Recent Lipid Panel Lab Results  Component Value Date/Time   CHOL 154 10/16/2018 02:15 PM   TRIG 323 (H) 10/16/2018 02:15 PM   HDL 35 (L) 10/16/2018 02:15 PM   CHOLHDL 4.4 10/16/2018 02:15 PM   CHOLHDL 4.2 07/16/2017 04:02 AM   LDLCALC 54 10/16/2018 02:15 PM   LDLDIRECT 103 (H) 03/11/2010 09:27 PM    Physical Exam:    VS:  LMP  (LMP Unknown)     Wt Readings from Last 3 Encounters:  02/27/20 187 lb (84.8 kg)  02/04/20 183 lb (83 kg)  01/11/20 191 lb 9.6 oz (86.9 kg)     Physical Exam ***  ASSESSMENT &  PLAN:    *** 1. Transient vision disturbance She had an episode of blurry vision that she reported to Dr. Harrington Challenger at her last visit.  Carotid Dopplers demonstrated mild bilateral carotid plaque but no significant obstruction.  She also had an event monitor that did not demonstrate any significant arrhythmias.  She has not had any further symptoms.  Should she have recurrent symptoms in the future, we may need to refer her to neurology for further evaluation.  Continue aspirin.  2. Chronic diastolic CHF (congestive heart failure) ([place]) 3. Shortness of breath She takes torsemide as needed.  Her weights have been stable and she has not had lower extremity swelling.  However, she does note shortness of breath with minimal activities.  She is NYHA III.  It is difficult to assess her volume status via telemedicine.  She has a history of mild aortic insufficiency and mitral regurgitation and moderate pulmonic insufficiency by echocardiogram in 2018.  Her activity level has decreased somewhat since COVID-19 restrictions started.  Her dyspnea may all be related to deconditioning.  I have recommended that we obtain labs and a follow-up echocardiogram to further assess her shortness of breath.               -Obtain BMET, CBC, BNP             -If BNP significantly elevated, adjust torsemide             -Obtain follow-up echocardiogram             -Follow-up with Dr. Harrington Challenger or me in 8 weeks  4. Valvular heart disease As noted, she had mild aortic insufficiency and mitral regurgitation and moderate pulmonic insufficiency by echocardiogram in 2019.  She describes fairly significant shortness of breath.  Obtain follow-up echocardiogram as noted.  5. Essential hypertension Uncontrolled.  She has intolerances to multiple medications.  I have recommended starting hydralazine 10 mg 3 times a day.  Continue current dose of spironolactone, amlodipine and valsartan.   Dispo:  No follow-ups on file.   Medication  Adjustments/Labs and Tests Ordered: Current medicines are reviewed at length with the patient today.  Concerns regarding medicines are outlined above.  Tests Ordered: No orders of the defined types were placed in this encounter.  Medication Changes: No orders of the defined types were placed in this encounter.   Signed, Richardson Dopp, PA-C  03/11/2020 5:38 PM    [facility] [address], [place], [place]  [address] Phone: [phone]; Fax: [phone]

## 2020-03-11 NOTE — Patient Instructions (Signed)
Peripheral Neuropathy Peripheral neuropathy is a type of nerve damage. It affects nerves that carry signals between the spinal cord and the arms, legs, and the rest of the body (peripheral nerves). It does not affect nerves in the spinal cord or brain. In peripheral neuropathy, one nerve or a group of nerves may be damaged. Peripheral neuropathy is a broad category that includes many specific nerve disorders, like diabetic neuropathy, hereditary neuropathy, and carpal tunnel syndrome. What are the causes? This condition may be caused by:  Diabetes. This is the most common cause of peripheral neuropathy.  Nerve injury.  Pressure or stress on a nerve that lasts a long time.  Lack (deficiency) of B vitamins. This can result from alcoholism, poor diet, or a restricted diet.  Infections.  Autoimmune diseases, such as rheumatoid arthritis and systemic lupus erythematosus.  Nerve diseases that are passed from parent to child (inherited).  Some medicines, such as cancer medicines (chemotherapy).  Poisonous (toxic) substances, such as lead and mercury.  Too little blood flowing to the legs.  Kidney disease.  Thyroid disease. In some cases, the cause of this condition is not known. What are the signs or symptoms? Symptoms of this condition depend on which of your nerves is damaged. Common symptoms include:  Loss of feeling (numbness) in the feet, hands, or both.  Tingling in the feet, hands, or both.  Burning pain.  Very sensitive skin.  Weakness.  Not being able to move a part of the body (paralysis).  Muscle twitching.  Clumsiness or poor coordination.  Loss of balance.  Not being able to control your bladder.  Feeling dizzy.  Sexual problems. How is this diagnosed? Diagnosing and finding the cause of peripheral neuropathy can be difficult. Your health care provider will take your medical history and do a physical exam. A neurological exam will also be done. This  involves checking things that are affected by your brain, spinal cord, and nerves (nervous system). For example, your health care provider will check your reflexes, how you move, and what you can feel. You may have other tests, such as:  Blood tests.  Electromyogram (EMG) and nerve conduction tests. These tests check nerve function and how well the nerves are controlling the muscles.  Imaging tests, such as CT scans or MRI to rule out other causes of your symptoms.  Removing a small piece of nerve to be examined in a lab (nerve biopsy). This is rare.  Removing and examining a small amount of the fluid that surrounds the brain and spinal cord (lumbar puncture). This is rare. How is this treated? Treatment for this condition may involve:  Treating the underlying cause of the neuropathy, such as diabetes, kidney disease, or vitamin deficiencies.  Stopping medicines that can cause neuropathy, such as chemotherapy.  Medicine to relieve pain. Medicines may include: ? Prescription or over-the-counter pain medicine. ? Antiseizure medicine. ? Antidepressants. ? Pain-relieving patches that are applied to painful areas of skin.  Surgery to relieve pressure on a nerve or to destroy a nerve that is causing pain.  Physical therapy to help improve movement and balance.  Devices to help you move around (assistive devices). Follow these instructions at home: Medicines  Take over-the-counter and prescription medicines only as told by your health care provider. Do not take any other medicines without first asking your health care provider.  Do not drive or use heavy machinery while taking prescription pain medicine. Lifestyle   Do not use any products that contain nicotine   or tobacco, such as cigarettes and e-cigarettes. Smoking keeps blood from reaching damaged nerves. If you need help quitting, ask your health care provider.  Avoid or limit alcohol. Too much alcohol can cause a vitamin B  deficiency, and vitamin B is needed for healthy nerves.  Eat a healthy diet. This includes: ? Eating foods that are high in fiber, such as fresh fruits and vegetables, whole grains, and beans. ? Limiting foods that are high in fat and processed sugars, such as fried or sweet foods. General instructions   If you have diabetes, work closely with your health care provider to keep your blood sugar under control.  If you have numbness in your feet: ? Check every day for signs of injury or infection. Watch for redness, warmth, and swelling. ? Wear padded socks and comfortable shoes. These help protect your feet.  Develop a good support system. Living with peripheral neuropathy can be stressful. Consider talking with a mental health specialist or joining a support group.  Use assistive devices and attend physical therapy as told by your health care provider. This may include using a walker or a cane.  Keep all follow-up visits as told by your health care provider. This is important. Contact a health care provider if:  You have new signs or symptoms of peripheral neuropathy.  You are struggling emotionally from dealing with peripheral neuropathy.  Your pain is not well-controlled. Get help right away if:  You have an injury or infection that is not healing normally.  You develop new weakness in an arm or leg.  You fall frequently. Summary  Peripheral neuropathy is when the nerves in the arms, or legs are damaged, resulting in numbness, weakness, or pain.  There are many causes of peripheral neuropathy, including diabetes, pinched nerves, vitamin deficiencies, autoimmune disease, and hereditary conditions.  Diagnosing and finding the cause of peripheral neuropathy can be difficult. Your health care provider will take your medical history, do a physical exam, and do tests, including blood tests and nerve function tests.  Treatment involves treating the underlying cause of the  neuropathy and taking medicines to help control pain. Physical therapy and assistive devices may also help. This information is not intended to replace advice given to you by your health care provider. Make sure you discuss any questions you have with your health care provider. Document Revised: 11/18/2017 Document Reviewed: 02/14/2017 Elsevier Patient Education  2020 [employer]  Diabetes Mellitus and Foot Care Foot care is an important part of your health, especially when you have diabetes. Diabetes may cause you to have problems because of poor blood flow (circulation) to your feet and legs, which can cause your skin to:  Become thinner and drier.  Break more easily.  Heal more slowly.  Peel and crack. You may also have nerve damage (neuropathy) in your legs and feet, causing decreased feeling in them. This means that you may not notice minor injuries to your feet that could lead to more serious problems. Noticing and addressing any potential problems early is the best way to prevent future foot problems. How to care for your feet Foot hygiene  Wash your feet daily with warm water and mild soap. Do not use hot water. Then, pat your feet and the areas between your toes until they are completely dry. Do not soak your feet as this can dry your skin.  Trim your toenails straight across. Do not dig under them or around the cuticle. File the edges of your   nails with an emery board or nail file.  Apply a moisturizing lotion or petroleum jelly to the skin on your feet and to dry, brittle toenails. Use lotion that does not contain alcohol and is unscented. Do not apply lotion between your toes. Shoes and socks  Wear clean socks or stockings every day. Make sure they are not too tight. Do not wear knee-high stockings since they may decrease blood flow to your legs.  Wear shoes that fit properly and have enough cushioning. Always look in your shoes before you put them on to be sure there are  no objects inside.  To break in new shoes, wear them for just a few hours a day. This prevents injuries on your feet. Wounds, scrapes, corns, and calluses  Check your feet daily for blisters, cuts, bruises, sores, and redness. If you cannot see the bottom of your feet, use a mirror or ask someone for help.  Do not cut corns or calluses or try to remove them with medicine.  If you find a minor scrape, cut, or break in the skin on your feet, keep it and the skin around it clean and dry. You may clean these areas with mild soap and water. Do not clean the area with peroxide, alcohol, or iodine.  If you have a wound, scrape, corn, or callus on your foot, look at it several times a day to make sure it is healing and not infected. Check for: ? Redness, swelling, or pain. ? Fluid or blood. ? Warmth. ? Pus or a bad smell. General instructions  Do not cross your legs. This may decrease blood flow to your feet.  Do not use heating pads or hot water bottles on your feet. They may burn your skin. If you have lost feeling in your feet or legs, you may not know this is happening until it is too late.  Protect your feet from hot and cold by wearing shoes, such as at the beach or on hot pavement.  Schedule a complete foot exam at least once a year (annually) or more often if you have foot problems. If you have foot problems, report any cuts, sores, or bruises to your health care provider immediately. Contact a health care provider if:  You have a medical condition that increases your risk of infection and you have any cuts, sores, or bruises on your feet.  You have an injury that is not healing.  You have redness on your legs or feet.  You feel burning or tingling in your legs or feet.  You have pain or cramps in your legs and feet.  Your legs or feet are numb.  Your feet always feel cold.  You have pain around a toenail. Get help right away if:  You have a wound, scrape, corn, or callus  on your foot and: ? You have pain, swelling, or redness that gets worse. ? You have fluid or blood coming from the wound, scrape, corn, or callus. ? Your wound, scrape, corn, or callus feels warm to the touch. ? You have pus or a bad smell coming from the wound, scrape, corn, or callus. ? You have a fever. ? You have a red line going up your leg. Summary  Check your feet every day for cuts, sores, red spots, swelling, and blisters.  Moisturize feet and legs daily.  Wear shoes that fit properly and have enough cushioning.  If you have foot problems, report any cuts, sores, or bruises to   your health care provider immediately.  Schedule a complete foot exam at least once a year (annually) or more often if you have foot problems. This information is not intended to replace advice given to you by your health care provider. Make sure you discuss any questions you have with your health care provider. Document Revised: 08/29/2019 Document Reviewed: 01/07/2017 Elsevier Patient Education  2020 Elsevier Inc.  

## 2020-03-12 ENCOUNTER — Ambulatory Visit: Payer: Medicare Other | Admitting: Physician Assistant

## 2020-03-14 NOTE — Progress Notes (Signed)
Subjective: Rebecca Walter presents today for preventative diabetic foot care, for diabetic foot evaluation and painful mycotic nails b/l that are difficult to trim. Pain interferes with ambulation. Aggravating factors include wearing enclosed shoe gear. Pain is relieved with periodic professional debridement.  Past Medical History:  Diagnosis Date  . Anxiety   . Arthritis   . Chronic diastolic CHF (congestive heart failure) (HCC)    Echocardiogram 01/2020: EF 70-75, no RWMA, Gr 1 DD, normal RVSF, RVSP 45.8, mild to mod LAE, trivial MR, mild PI  . Diabetes mellitus   . GERD (gastroesophageal reflux disease)   . Gout   . Hyperlipidemia   . Hypertension   . Hypertensive heart disease with CHF (congestive heart failure) ([place])   . Mild aortic stenosis 06/2017  . Mild mitral regurgitation 06/2017  . Moderate pulmonary valve insufficiency 06/2017  . Osteopenia     Patient Active Problem List   Diagnosis Date Noted  . Candidiasis of skin 02/14/2020  . Epistaxis 02/04/2020  . Restless leg syndrome 01/23/2020  . Obstructive sleep apnea 12/04/2019  . Hoarseness 11/27/2019  . Leg weakness, bilateral 07/04/2019  . Dehydration 03/26/2019  . Hyponatremia   . Diabetic autonomic neuropathy ([place]) 01/24/2019  . Generalized weakness 12/15/2018  . Chronically dry eyes, right 12/15/2018  . Hypertension 10/17/2018  . Elevated TSH 11/21/2017  . Hypertensive heart disease with CHF (congestive heart failure) ([place])   . Chronic diastolic CHF (congestive heart failure) ([place]) 07/15/2017  . Mild aortic stenosis 06/19/2017  . Mild mitral regurgitation 06/19/2017  . Moderate pulmonary valve insufficiency 06/19/2017  . Lumbar back pain with radiculopathy affecting right lower extremity 10/29/2016  . Vertigo 08/29/2013  . Diarrhea 01/12/2013  . Allergic rhinitis 03/22/2011  . INSOMNIA 09/16/2009  . GASTROPARESIS 08/12/2009  . Anxiety 10/18/2008  . DYSKINESIA, ESOPHAGUS 09/13/2007  . CARCINOMA, BASAL  CELL 05/26/2007  . Diabetes mellitus, type II ([place]) 02/16/2007  . HLD (hyperlipidemia) 02/16/2007  . OBESITY, NOS 02/16/2007  . GASTROESOPHAGEAL REFLUX, NO ESOPHAGITIS 02/16/2007  . Osteoarthritis of both knees 02/16/2007  . Osteopenia after menopause 02/16/2007  . Gout 02/16/2007    Past Surgical History:  Procedure Laterality Date  . KIDNEY SURGERY    . KNEE SURGERY    . MENISECTOMY    . WRIST SURGERY      Current Outpatient Medications on File Prior to Visit  Medication Sig Dispense Refill  . ACCU-CHEK AVIVA PLUS test strip CHECK BLOOD SUGAR FIRST  THING IN THE MORNING BEFORE EATING, AFTER LUNCH, AND  AFTER DINNER  TOTAL 3 TIMES DAILY 300 strip 3  . acetaminophen (TYLENOL) 325 MG tablet Take 325 mg by mouth every 8 (eight) hours as needed (pain).     Marland Kitchen allopurinol (ZYLOPRIM) 100 MG tablet TAKE 2 TABLETS BY MOUTH EVERY DAY 60 tablet 8  . amLODipine (NORVASC) 10 MG tablet TAKE 1 TABLET BY MOUTH EVERY DAY 90 tablet 3  . amLODipine (NORVASC) 2.5 MG tablet Take 2.5 mg by mouth 2 (two) times daily as needed (elevated blood pressure).    Marland Kitchen amLODipine (NORVASC) 5 MG tablet Take 5 mg by mouth daily as needed (elevated blood pressure).    Marland Kitchen aspirin 81 MG chewable tablet Chew 1 tablet (81 mg total) by mouth daily. 30 tablet 5  . baclofen (LIORESAL) 10 MG tablet Take 1 tablet (10 mg total) by mouth 2 (two) times daily. 30 each 2  . Blood Glucose Monitoring Suppl (ACCU-CHEK AVIVA PLUS) w/Device KIT 1 Device by Does not apply  route daily. 1 kit 3  . busPIRone (BUSPAR) 15 MG tablet TAKE 1 TABLET (15 MG TOTAL) BY MOUTH 2 (TWO) TIMES DAILY. 60 tablet 11  . Camphor-Eucalyptus-Menthol (VICKS VAPORUB EX) Place 1 application into both nostrils daily as needed (congestion).    . clotrimazole (LOTRIMIN) 1 % cream Apply 1 application topically 2 (two) times daily. 30 g 0  . famotidine (PEPCID) 20 MG tablet Take 20 mg by mouth daily.    . hydrocortisone 2.5 % cream Apply topically 2 (two) times daily. 30  g 0  . insulin aspart (NOVOLOG FLEXPEN) 100 UNIT/ML FlexPen Inject 9-18 Units into the skin See admin instructions. Inject 15 units subcutaneously daily with breakfast, inject 9 units with lunch as needed for CBG 180 or more, and inject 9 units with supper 15 mL 3  . Insulin Glargine (LANTUS SOLOSTAR) 100 UNIT/ML Solostar Pen Inject 65 Units into the skin daily. 15 mL 6  . Insulin Pen Needle (B-D ULTRAFINE III SHORT PEN) 31G X 8 MM MISC USE TO INJECT INSULIN 4 TIMES DAILY OR AS DIRECTED. 100 each 5  . loratadine (CLARITIN) 10 MG tablet Take 10 mg by mouth daily.    Marland Kitchen lovastatin (MEVACOR) 40 MG tablet Take 1 tablet (40 mg total) by mouth daily with supper. 90 tablet 3  . metFORMIN (GLUCOPHAGE-XR) 500 MG 24 hr tablet Take 500 mg by mouth daily with breakfast.    . metoCLOPramide (REGLAN) 5 MG tablet Take 5 mg by mouth 2 (two) times daily.    . Multiple Vitamin (MULTIVITAMIN WITH MINERALS) TABS tablet Take 1 tablet by mouth daily.    . pantoprazole (PROTONIX) 40 MG tablet Take 1 tablet (40 mg total) by mouth 2 (two) times daily. 60 tablet 3  . rOPINIRole (REQUIP) 0.25 MG tablet Take 1 tablet (0.25 mg total) by mouth at bedtime. 30 tablet 2  . sertraline (ZOLOFT) 100 MG tablet Take 1 tablet (100 mg total) by mouth daily. 30 tablet 11  . sertraline (ZOLOFT) 50 MG tablet Take 1 tablet (50 mg total) by mouth 2 (two) times daily. One dose in the afternoon and a second before bed. 60 tablet 3  . spironolactone (ALDACTONE) 25 MG tablet Take 1 tablet (25 mg total) by mouth daily. 90 tablet 3  . torsemide (DEMADEX) 10 MG tablet Take 1 tablet (10 mg total) by mouth daily as needed. 30 tablet 11  . traZODone (DESYREL) 50 MG tablet Take 1 tablet (50 mg total) by mouth at bedtime. 90 tablet 3  . valsartan (DIOVAN) 320 MG tablet Take 1 tablet (320 mg total) by mouth daily. 30 tablet 11  . [DISCONTINUED] busPIRone (BUSPAR) 15 MG tablet Take 1 tablet (15 mg total) by mouth 2 (two) times daily. 60 tablet 2  .  [DISCONTINUED] busPIRone (BUSPAR) 15 MG tablet TAKE 1 TABLET (15 MG TOTAL) BY MOUTH 2 (TWO) TIMES DAILY. 60 tablet 2  . [DISCONTINUED] glucose blood (ACCU-CHEK AVIVA PLUS) test strip CHECK BLOOD SUGAR FIRST  THING IN THE MORNING BEFORE EATING, AFTER LUNCH, AND  AFTER DINNER TOTAL 3 TIMES  DAILY 300 each 3  . [DISCONTINUED] torsemide (DEMADEX) 10 MG tablet TAKE 1 TABLET BY MOUTH EVERY DAY AS NEEDED 30 tablet 0  . [DISCONTINUED] torsemide (DEMADEX) 10 MG tablet Take 1 tablet (10 mg total) by mouth daily as needed. 30 tablet 0   No current facility-administered medications on file prior to visit.     Allergies  Allergen Reactions  . Baclofen Other (See Comments)  Caused leg weakness  . Hctz [Hydrochlorothiazide] Other (See Comments)    Uric acid elevation and gout.     . Lotensin [Benazepril] Other (See Comments)    Dry cough   . Victoza [Liraglutide] Other (See Comments)    Tongue Glossitus  . Acyclovir And Related Diarrhea  . Beta Adrenergic Blockers Other (See Comments)    Occurred with metoprolol  REACTION: coughing  . Losartan Diarrhea  . Other Diarrhea and Other (See Comments)    Occurred with metoprolol  REACTION: coughing    Social History   Occupational History  . Occupation: retired-office work  Tobacco Use  . Smoking status: Never Smoker  . Smokeless tobacco: Never Used  Substance and Sexual Activity  . Alcohol use: No  . Drug use: No  . Sexual activity: Not Currently    Family History  Problem Relation Age of Onset  . Stroke Mother   . Heart disease Father   . Stroke Father   . Hypertension Father   . Cancer Sister   . Cancer Brother   . Heart disease Brother   . Diabetes Maternal Uncle     Immunization History  Administered Date(s) Administered  . Influenza Split 11/05/2011, 09/20/2012  . Influenza Whole 11/15/2007, 10/24/2009, 09/25/2010  . Influenza, High Dose Seasonal PF 09/07/2018  . Influenza,inj,Quad PF,6+ Mos 10/08/2014, 10/10/2015,  09/26/2017  . Influenza-Unspecified 10/19/2013, 10/03/2016, 10/26/2019  . PFIZER SARS-COV-2 Vaccination 02/22/2020  . Pneumococcal Conjugate-13 09/26/2017  . Pneumococcal Polysaccharide-23 10/20/2004  . Td 10/20/2002  . Tdap 11/21/2017     Objective: Vitals:   03/11/20 1356  Temp: (!) 97.2 F (36.2 C)    Rebecca Walter is a/an 79 y.o. female  IN NAD. AAO X 3.  Capillary refill time to digits immediate b/l. Palpable DP pulses b/l. Palpable PT pulses b/l. Pedal hair present b/l. Skin temperature gradient within normal limits b/l.  Pedal skin with normal turgor, texture and tone bilaterally. No open wounds bilaterally. No interdigital macerations bilaterally. Toenails 1-5 b/l elongated, dystrophic, thickened, crumbly with subungual debris and tenderness to dorsal palpation.  Normal muscle strength 5/5 to all lower extremity muscle groups bilaterally, no pain crepitus or joint limitation noted with ROM b/l and hammertoes noted to the  L 2nd toe and R 2nd toe.  Protective sensation intact 5/5 intact bilaterally with 10g monofilament b/l Vibratory sensation intact b/l Proprioception intact bilaterally.  Hemoglobin A1C Latest Ref Rng & Units 01/11/2020 07/20/2019 03/26/2019  HGBA1C 0.0 - 7.0 % 6.6 6.2 7.0(H)  Some recent data might be hidden   Assessment: 1. Pain due to onychomycosis of toenails of both feet   2. Acquired hammertoes of both feet   3. Type 2 diabetes mellitus with diabetic autonomic neuropathy, with long-term current use of insulin ([place])   4. Encounter for diabetic foot exam ([place])     Plan: -Diabetic foot examination performed on today's visit. -Continue diabetic foot care principles. Literature dispensed on today.  -Toenails 1-5 b/l were debrided in length and girth with sterile nail nippers and dremel without iatrogenic bleeding.  -Patient to continue soft, supportive shoe gear daily. -Patient to report any pedal injuries to medical professional  immediately. -Patient/POA to call should there be question/concern in the interim.  Return in about 3 months (around 06/11/2020) for diabetic nail trim.

## 2020-03-25 ENCOUNTER — Ambulatory Visit: Payer: Medicare Other | Attending: Internal Medicine

## 2020-03-25 DIAGNOSIS — Z23 Encounter for immunization: Secondary | ICD-10-CM

## 2020-03-25 NOTE — Progress Notes (Signed)
   Covid-19 Vaccination Clinic  Name:  Rebecca Walter    MRN: [id number] DOB: 1941/11/24  03/25/2020  Rebecca Walter was observed post Covid-19 immunization for 15 minutes without incident. She was provided with Vaccine Information Sheet and instruction to access the V-Safe system.   Rebecca Walter was instructed to call 911 with any severe reactions post vaccine: Marland Kitchen Difficulty breathing  . Swelling of face and throat  . A fast heartbeat  . A bad rash all over body  . Dizziness and weakness   Immunizations Administered    Name Date Dose VIS Date Route   Pfizer COVID-19 Vaccine 03/25/2020  2:12 PM 0.3 mL 11/30/2019 Intramuscular   Manufacturer: [place]   Lot: [id number]   [place]: [id number]

## 2020-03-26 ENCOUNTER — Other Ambulatory Visit: Payer: Self-pay

## 2020-03-26 ENCOUNTER — Telehealth (INDEPENDENT_AMBULATORY_CARE_PROVIDER_SITE_OTHER): Payer: Medicare Other | Admitting: Physician Assistant

## 2020-03-26 ENCOUNTER — Encounter: Payer: Self-pay | Admitting: Physician Assistant

## 2020-03-26 VITALS — BP 151/58 | HR 56 | Ht 60.0 in | Wt 186.0 lb

## 2020-03-26 DIAGNOSIS — I2729 Other secondary pulmonary hypertension: Secondary | ICD-10-CM | POA: Diagnosis not present

## 2020-03-26 DIAGNOSIS — I110 Hypertensive heart disease with heart failure: Secondary | ICD-10-CM

## 2020-03-26 DIAGNOSIS — I5032 Chronic diastolic (congestive) heart failure: Secondary | ICD-10-CM | POA: Diagnosis not present

## 2020-03-26 DIAGNOSIS — I10 Essential (primary) hypertension: Secondary | ICD-10-CM

## 2020-03-26 NOTE — Patient Instructions (Signed)
Medication Instructions:   Your physician recommends that you continue on your current medications as directed. Please refer to the Current Medication list given to you today.  *If you need a refill on your cardiac medications before your next appointment, please call your pharmacy*  Lab Work:  None ordered today  Testing/Procedures:  None ordered today  Follow-Up: At [facility], you and your health needs are our priority.  As part of our continuing mission to provide you with exceptional heart care, we have created designated Provider Care Teams.  These Care Teams include your primary Cardiologist (physician) and Advanced Practice Providers (APPs -  Physician Assistants and Nurse Practitioners) who all work together to provide you with the care you need, when you need it.  We recommend signing up for the patient portal called "MyChart".  Sign up information is provided on this After Visit Summary.  MyChart is used to connect with patients for Virtual Visits (Telemedicine).  Patients are able to view lab/test results, encounter notes, upcoming appointments, etc.  Non-urgent messages can be sent to your provider as well.   To learn more about what you can do with MyChart, go to [email or web address].    Your next appointment:   6 month(s)  The format for your next appointment:   In Person  Provider:   You may see Dorris Carnes, MD or Richardson Dopp, PA-C

## 2020-03-26 NOTE — Progress Notes (Signed)
Virtual Visit via Telephone Note   This visit type was conducted due to national recommendations for restrictions regarding the COVID-19 Pandemic (e.g. social distancing) in an effort to limit this patient's exposure and mitigate transmission in our community.  Due to her co-morbid illnesses, this patient is at least at moderate risk for complications without adequate follow up.  This format is felt to be most appropriate for this patient at this time.  The patient did not have access to video technology/had technical difficulties with video requiring transitioning to audio format only (telephone).  All issues noted in this document were discussed and addressed.  No physical exam could be performed with this format.  Please refer to the patient's chart for her  consent to telehealth for [facility].   The patient was identified using 2 identifiers.  Date:  03/26/2020   ID:  Rebecca Walter, DOB [birth year], MRN [id number]  Patient Location: Home Provider Location: Home  PCP:  Rebecca Alu, MD  Cardiologist:  Dorris Carnes, MD   Electrophysiologist:  None   Evaluation Performed:  Follow-Up Visit  Chief Complaint:  CHF, Hypertension   Patient Profile: Rebecca Walter is a 79 y.o. female with    Chronic Diastolic CHF  Echocardiogram 01/2020: EF 70-75, Gr 1 DD  Valvular Heart Disease ? Mild AI, mild MR, mod PI  Pulmonary Hypertension   Echocardiogram 01/2020: RVSP 45.8  Hypertension  ? Hx of intol to carvedilol, clonidine  ? 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   L vertebral artery stenosis  R subclavian stenosis  Transient blurry vision 2020  Monitor: no sig arrhythmia; Carotid US: mild bilat plaque (no sig dz)  Myoview 9/18: Low Risk, small ant-apical ischemia vs shifting breast atten; asymptomatic >> Med Rx  Prior CV  Studies: Echocardiogram 01/29/2020 EF 70-75, no RWMA, Gr 1 DD, mild RVE, normal RVSF, RVSP 45.8, mild to mod LAE, trivial MR, mild PI  LT Cardiac Monitor 12/2019 Sinus rhythm 28 to 160 bpm Average 59 bpm Occasional PVCs, PAC Rare burst NSVT Longest 8 beats   Carotid US 12/12/2019 Summary: Right Carotid: Velocities in the right ICA are consistent with a 1-39% stenosis. Non-hemodynamically significant plaque <50% noted in the CCA. Left Carotid: Velocities in the left ICA are consistent with a 1-39% stenosis. Vertebrals: Right vertebral artery demonstrates antegrade flow. Left vertebral artery demonstrates high resistant flow. Subclavians: Right subclavian artery was stenotic. Normal flow hemodynamics were seen in the left subclavian artery.  Myoview 09/06/17  Nuclear stress EF: 67%.  Defect 1: There is a small defect of mild severity present in the mid anteroseptal and apical septal location.  This is a low risk study.  Findings consistent with ischemia. Low risk stress nuclear study with a small area of apicoseptal ischemia and normal left ventricular regional and global systolic function. The true apex is spared. Findings may signify distal stenosis in a small LAD artery, but cannot exclude shifting breast attenuation artifact.  Echocardiogram 07/16/17 Mild LVH, EF 60-65, Gr 1 DD, mild AI, mild MR, mild LAE, mod PI, PASP 35 Trivial eff  History of Present Illness:    Rebecca Walter was last seen via Telemedicine in 12/2019. She has previously been eval by Dr. Harrington Challenger for transient visual disturbance.  She had a neg workup and no further symptoms. When last seen  she noted shortness of breath.  An echocardiogram was obtained and demonstrated normal EF, mild diastolic dysfunction and mod pulmonary hypertension.  She missed her last visit (thought it was virtual but it was in person).  She was rescheduled to a virtual visit today.  Today, she  continues to note dyspnea on exertion.  This seems to be stable.  She has not had chest pain, syncope. She sleeps on 3 pillows chronically due to GERD.  She has not had leg swelling.  Her weights have been stable.  She did not have much improvement with her BP on Hydralazine and her PCP stopped this.  She received her 2nd COVID-19 vaccine last week.   Past Medical History:  Diagnosis Date  . Anxiety   . Arthritis   . Chronic diastolic CHF (congestive heart failure) (HCC)    Echocardiogram 01/2020: EF 70-75, no RWMA, Gr 1 DD, normal RVSF, RVSP 45.8, mild to mod LAE, trivial MR, mild PI  . Diabetes mellitus   . GERD (gastroesophageal reflux disease)   . Gout   . Hyperlipidemia   . Hypertension   . Hypertensive heart disease with CHF (congestive heart failure) ([place])   . Mild aortic stenosis 06/2017  . Mild mitral regurgitation 06/2017  . Moderate pulmonary valve insufficiency 06/2017  . Osteopenia    Past Surgical History:  Procedure Laterality Date  . KIDNEY SURGERY    . KNEE SURGERY    . MENISECTOMY    . WRIST SURGERY       Current Meds  Medication Sig  . ACCU-CHEK AVIVA PLUS test strip CHECK BLOOD SUGAR FIRST  THING IN THE MORNING BEFORE EATING, AFTER LUNCH, AND  AFTER DINNER  TOTAL 3 TIMES DAILY  . acetaminophen (TYLENOL) 325 MG tablet Take 325 mg by mouth every 8 (eight) hours as needed (pain).   Marland Kitchen allopurinol (ZYLOPRIM) 100 MG tablet TAKE 2 TABLETS BY MOUTH EVERY DAY  . amLODipine (NORVASC) 10 MG tablet TAKE 1 TABLET BY MOUTH EVERY DAY  . amLODipine (NORVASC) 2.5 MG tablet Take 2.5 mg by mouth 2 (two) times daily as needed (elevated blood pressure).  Marland Kitchen aspirin 81 MG chewable tablet Chew 1 tablet (81 mg total) by mouth daily.  . baclofen (LIORESAL) 10 MG tablet Take 1 tablet (10 mg total) by mouth 2 (two) times daily.  . Blood Glucose Monitoring Suppl (ACCU-CHEK AVIVA PLUS) w/Device KIT 1 Device by Does not apply route daily.  . busPIRone (BUSPAR) 15 MG tablet TAKE 1 TABLET (15  MG TOTAL) BY MOUTH 2 (TWO) TIMES DAILY.  Marland Kitchen Camphor-Eucalyptus-Menthol (VICKS VAPORUB EX) Place 1 application into both nostrils daily as needed (congestion).  . famotidine (PEPCID) 20 MG tablet Take 20 mg by mouth daily.  . hydrocortisone 2.5 % cream Apply topically 2 (two) times daily.  . insulin aspart (NOVOLOG FLEXPEN) 100 UNIT/ML FlexPen Inject 9-18 Units into the skin See admin instructions. Inject 15 units subcutaneously daily with breakfast, inject 9 units with lunch as needed for CBG 180 or more, and inject 9 units with supper  . Insulin Glargine (LANTUS SOLOSTAR) 100 UNIT/ML Solostar Pen Inject 65 Units into the skin daily.  . Insulin Pen Needle (B-D ULTRAFINE III SHORT PEN) 31G X 8 MM MISC USE TO INJECT INSULIN 4 TIMES DAILY OR AS DIRECTED.  Marland Kitchen loratadine (CLARITIN) 10 MG tablet Take 10 mg by mouth daily.  Marland Kitchen lovastatin (MEVACOR) 40 MG tablet Take 1 tablet (40 mg total) by mouth daily with supper.  . metFORMIN (GLUCOPHAGE-XR) 500  MG 24 hr tablet Take 500 mg by mouth daily with breakfast.  . Multiple Vitamin (MULTIVITAMIN WITH MINERALS) TABS tablet Take 1 tablet by mouth daily.  . pantoprazole (PROTONIX) 40 MG tablet Take 1 tablet (40 mg total) by mouth 2 (two) times daily.  . sertraline (ZOLOFT) 100 MG tablet Take 1 tablet (100 mg total) by mouth daily.  . sertraline (ZOLOFT) 50 MG tablet Take 1 tablet (50 mg total) by mouth 2 (two) times daily. One dose in the afternoon and a second before bed.  . spironolactone (ALDACTONE) 25 MG tablet Take 1 tablet (25 mg total) by mouth daily.  Marland Kitchen torsemide (DEMADEX) 10 MG tablet Take 1 tablet (10 mg total) by mouth daily as needed.  . traZODone (DESYREL) 50 MG tablet Take 1 tablet (50 mg total) by mouth at bedtime.  . valsartan (DIOVAN) 320 MG tablet Take 1 tablet (320 mg total) by mouth daily.     Allergies:   Baclofen, Hctz [hydrochlorothiazide], Lotensin [benazepril], Victoza [liraglutide], Acyclovir and related, Beta adrenergic blockers, Losartan,  and Other   Social History   Tobacco Use  . Smoking status: Never Smoker  . Smokeless tobacco: Never Used  Substance Use Topics  . Alcohol use: No  . Drug use: No     Family Hx: The patient's family history includes Cancer in her brother and sister; Diabetes in her maternal uncle; Heart disease in her brother and father; Hypertension in her father; Stroke in her father and mother.  ROS:   Please see the history of present illness.      Labs/Other Tests and Data Reviewed:    EKG:  No ECG reviewed.  Recent Labs: 01/11/2020: ALT 21; BNP 17.5; BUN 40; Creatinine, Ser 1.09; Hemoglobin 12.2; Platelets 223; Potassium 4.5; Sodium 137; TSH 4.960   Recent Lipid Panel Lab Results  Component Value Date/Time   CHOL 154 10/16/2018 02:15 PM   TRIG 323 (H) 10/16/2018 02:15 PM   HDL 35 (L) 10/16/2018 02:15 PM   CHOLHDL 4.4 10/16/2018 02:15 PM   CHOLHDL 4.2 07/16/2017 04:02 AM   LDLCALC 54 10/16/2018 02:15 PM   LDLDIRECT 103 (H) 03/11/2010 09:27 PM    Wt Readings from Last 3 Encounters:  03/26/20 186 lb (84.4 kg)  02/27/20 187 lb (84.8 kg)  02/04/20 183 lb (83 kg)     Objective:    Vital Signs:  BP (!) 151/58   Pulse (!) 56   Ht 5' (1.524 m)   Wt 186 lb (84.4 kg)   LMP  (LMP Unknown)   BMI 36.33 kg/m    VITAL SIGNS:  reviewed GEN:  no acute distress RESPIRATORY:  no labored breathing NEURO:  alert and oriented  PSYCH:  normal affect  ASSESSMENT & PLAN:    1. Chronic diastolic CHF (congestive heart failure) (HCC) Echocardiogram 01/2020 with EF 40-08 and mild diastolic dysfunction.  Recent BNP was normal.  Her volume seems stable.  I suspect her dyspnea on exertion is multifactorial and related to deconditioning, obesity and CHF.  Continue current medications.   2. Essential hypertension Fair control.  She had hyperdynamic LVF on echocardiogram.  A low dose beta-blocker may help her pressure some and improve some of her dyspnea.  However, her chart indicates she has an  intol to beta-blockers in the past.  Continue current regimen which includes Amlodipine, Spironolactone, Valsartan.   3. Other secondary pulmonary hypertension (HCC) Likely related to age and CHF.  No further workup is indicated at this time.  Time:   Today, I have spent 16 minutes with the patient with telehealth technology discussing the above problems.     Medication Adjustments/Labs and Tests Ordered: Current medicines are reviewed at length with the patient today.  Concerns regarding medicines are outlined above.   Tests Ordered: No orders of the defined types were placed in this encounter.   Medication Changes: No orders of the defined types were placed in this encounter.   Follow Up:  In Person in 6 month(s)  Signed, Richardson Dopp, PA-C  03/26/2020 5:29 PM    [facility]

## 2020-03-27 ENCOUNTER — Other Ambulatory Visit: Payer: Self-pay

## 2020-03-27 ENCOUNTER — Telehealth (INDEPENDENT_AMBULATORY_CARE_PROVIDER_SITE_OTHER): Payer: Medicare Other | Admitting: Family Medicine

## 2020-03-27 DIAGNOSIS — F419 Anxiety disorder, unspecified: Secondary | ICD-10-CM | POA: Diagnosis not present

## 2020-03-27 DIAGNOSIS — I10 Essential (primary) hypertension: Secondary | ICD-10-CM | POA: Diagnosis not present

## 2020-03-27 NOTE — Progress Notes (Signed)
[place] Telemedicine Visit  Patient consented to have virtual visit. Method of visit: Telephone  Encounter participants: Patient: Rebecca Walter - located at home Provider: Kathrene Alu - located at [facility] Others (if applicable): none  Chief Complaint: f/u anxiety  HPI:  Rebecca Walter reports that her anxiety has been better controlled lately.  She says that she sometimes talks with the nurse online through [employer] and she has built some good relationships with those nurses.  She has not looked into the therapist that we talked about last time because she says that she would rather rely on prior and the nurses at [employer].  She continues to take Zoloft without difficulty.  She thinks that her high blood pressure is related to her anxiety and says that it continues to be about 150/60 on average.  Her diarrhea has improved.  She takes Imodium prior to each meal and finds that this prevents diarrhea.  ROS: per HPI  Pertinent PMHx: Anxiety, hypertension, controlled type 2 diabetes  Exam:  Respiratory: No shortness of breath or cough during conversation, patient able to speak in complete sentences without difficulty  Assessment/Plan:  Anxiety Well-controlled recently.  I discussed with her that she may want to consider increasing the intervals between her phone calls since I will be graduating in 3 months and her new doctor may be unable to continue weekly phone calls, but she says that she would like to continue weekly phone calls until I graduate.  We will continue to check in with her once weekly.  An appointment was made for next week.  We will try a video visit at that time.  Hypertension Reassured patient that her blood pressure does not need to be further tightened.  We will continue to withhold hydralazine since this was not helpful for her.    Time spent during visit with patient: 13 minutes

## 2020-03-27 NOTE — Assessment & Plan Note (Signed)
Reassured patient that her blood pressure does not need to be further tightened.  We will continue to withhold hydralazine since this was not helpful for her.

## 2020-03-27 NOTE — Assessment & Plan Note (Signed)
Well-controlled recently.  I discussed with her that she may want to consider increasing the intervals between her phone calls since I will be graduating in 3 months and her new doctor may be unable to continue weekly phone calls, but she says that she would like to continue weekly phone calls until I graduate.  We will continue to check in with her once weekly.  An appointment was made for next week.  We will try a video visit at that time.

## 2020-03-30 ENCOUNTER — Telehealth: Payer: Self-pay | Admitting: Family Medicine

## 2020-03-30 MED ORDER — ACCU-CHEK AVIVA PLUS VI STRP: ORAL_STRIP | 3 refills | Status: DC

## 2020-03-30 NOTE — Telephone Encounter (Signed)
**  After Hours/ Emergency Line Call**  Received a page to call [phone]) [phone].  Patient: Rebecca Walter  Caller: Self  Confirmed name & DOB of patient with caller  Subjective:  Caller reports that she is out of test strips at this time.  She called her to the pharmacy who suggested she call the family practice to get a refill of her strips to [facility] as she is not going to receive a delivery from [facility] in the near future.  She does not have any other concerns or questions at this time.  Objective:  Observations: NAD   Assessment & Plan  Rebecca Walter is a 79 y.o. female with PMHx of diabetes who calls with the following complaints and concerns: Out of test trips.  Sent to strips [facility]ills [facility].  Patient has no other questions or concerns.  Wilber Oliphant, M.D.  PGY-2  [facility] 03/30/2020 8:15 PM

## 2020-04-02 ENCOUNTER — Other Ambulatory Visit: Payer: Self-pay

## 2020-04-02 ENCOUNTER — Telehealth (INDEPENDENT_AMBULATORY_CARE_PROVIDER_SITE_OTHER): Payer: Medicare Other | Admitting: Family Medicine

## 2020-04-02 DIAGNOSIS — R490 Dysphonia: Secondary | ICD-10-CM | POA: Diagnosis not present

## 2020-04-02 DIAGNOSIS — Z794 Long term (current) use of insulin: Secondary | ICD-10-CM

## 2020-04-02 DIAGNOSIS — E1143 Type 2 diabetes mellitus with diabetic autonomic (poly)neuropathy: Secondary | ICD-10-CM

## 2020-04-02 DIAGNOSIS — F419 Anxiety disorder, unspecified: Secondary | ICD-10-CM | POA: Diagnosis not present

## 2020-04-02 NOTE — Progress Notes (Addendum)
[place] Telemedicine Visit  Patient consented to have virtual visit and was identified by name and date of birth. Method of visit: Video  Encounter participants: Patient: Rebecca Walter - located at home Provider: Kathrene Alu - located at [facility] Others (if applicable): none  Chief Complaint: f/u anxiety  HPI:  Ms. Franklyn reports that her anxiety has been overall well controlled and stable over the past week.  She says that it is normally not a concern unless something upsets her, and then she will try to think about something else and play music.  She also uses prayer to help alleviate her anxiety.  She plans to go on a walk with a friend this Friday and is looking forward to that.  She would like to see ENT regarding her hoarseness and difficulty swallowing.  She thinks that GI had already taken care of everything they can from the standpoint.  She saw ENT in the last 2 years and is hoping that she can call them without needing a referral.  She received her second Covid vaccine last week.  ROS: per HPI  Pertinent PMHx: Type 2 diabetes, diastolic heart failure, hypertension, anxiety  Exam:  BP (!) 157/64   Pulse (!) 58   LMP  (LMP Unknown)   Respiratory: No difficulty breathing or cough during exam, able to speak in full sentences without difficulty.  Assessment/Plan:  Anxiety Well-controlled currently.  Congratulated patient on her constructive coping mechanisms.  We will follow-up next week via video.  Hoarseness Encouraged patient to let me know if she needs a referral to ENT.  Diabetes mellitus, type II ([place]) Is due for a lipid panel.  Patient was encouraged to make a lab appointment to get this done when she is ready.  She will continue to think about when she might come in and she will let me know so that I can place the order and make the appointment.  She plans to see her eye doctor in July.    Time spent during visit with patient: 12  minutes

## 2020-04-02 NOTE — Assessment & Plan Note (Addendum)
Is due for a lipid panel.  Patient was encouraged to make a lab appointment to get this done when she is ready.  She will continue to think about when she might come in and she will let me know so that I can place the order and make the appointment.  She plans to see her eye doctor in July.

## 2020-04-02 NOTE — Progress Notes (Signed)
Patient states that no temp or weight was taken.  Blood sugar was 192.  Rebecca Walter, [place]

## 2020-04-02 NOTE — Assessment & Plan Note (Signed)
Well-controlled currently.  Congratulated patient on her constructive coping mechanisms.  We will follow-up next week via video.

## 2020-04-02 NOTE — Assessment & Plan Note (Signed)
Encouraged patient to let me know if she needs a referral to ENT.

## 2020-04-10 ENCOUNTER — Other Ambulatory Visit: Payer: Self-pay

## 2020-04-10 ENCOUNTER — Telehealth (INDEPENDENT_AMBULATORY_CARE_PROVIDER_SITE_OTHER): Payer: Medicare Other | Admitting: Family Medicine

## 2020-04-10 DIAGNOSIS — F419 Anxiety disorder, unspecified: Secondary | ICD-10-CM

## 2020-04-10 NOTE — Progress Notes (Signed)
Attempted to call Rebecca Walter twice and left messages each time.  We will plan on speaking again next week.  I encouraged her to call if she has any questions or concerns.

## 2020-04-14 ENCOUNTER — Other Ambulatory Visit: Payer: Self-pay | Admitting: Family Medicine

## 2020-04-16 ENCOUNTER — Other Ambulatory Visit: Payer: Self-pay

## 2020-04-16 ENCOUNTER — Encounter: Payer: Self-pay | Admitting: Family Medicine

## 2020-04-16 ENCOUNTER — Telehealth (INDEPENDENT_AMBULATORY_CARE_PROVIDER_SITE_OTHER): Payer: Medicare Other | Admitting: Family Medicine

## 2020-04-16 VITALS — BP 147/58 | HR 59 | Wt 187.0 lb

## 2020-04-16 DIAGNOSIS — F419 Anxiety disorder, unspecified: Secondary | ICD-10-CM

## 2020-04-16 DIAGNOSIS — R296 Repeated falls: Secondary | ICD-10-CM | POA: Diagnosis not present

## 2020-04-17 DIAGNOSIS — R296 Repeated falls: Secondary | ICD-10-CM | POA: Insufficient documentation

## 2020-04-17 NOTE — Progress Notes (Signed)
[place] Telemedicine Visit  Patient consented to have virtual visit and was identified by name and date of birth. Method of visit: Video  Encounter participants: Patient: Rebecca Walter - located at [employer] Provider: Kathrene Alu - located at [facility] Others (if applicable): none  Chief Complaint: Follow-up on anxiety, recent fall  HPI:  Anxiety Patient says that her anxiety is pretty well controlled currently.  She is working on lifestyle modifications and breathing exercises.  She continues on her Zoloft as well.  She uses prayer as well to help her cope with her anxiety.  Recent fall Patient reports that she tripped over something on the floor and fell about 1 week ago, hurting her right leg in the process.  She says that she had some bruising but was able to bear weight and has minimal pain now.  She did not hit her head or lose consciousness.  She denies feeling lightheaded at the time of her fall.  She is hoping to go outside to walk but is worried that she may trip over uneven ground.  ROS: per HPI  Pertinent PMHx: Type 2 diabetes, CHF, hypertension, anxiety  Exam:  BP (!) 147/58   Pulse (!) 59   Wt 187 lb (84.8 kg)   LMP  (LMP Unknown)   BMI 36.52 kg/m   Respiratory: Normal work of breathing, no cough, able to speak in long sentences without shortness of breath  Assessment/Plan:  Anxiety Well-controlled currently on Zoloft and buspirone.  Patient has also improved on her use of lifestyle modifications and coping mechanisms.  Frequent falls Reassured that this was a mechanical fall.  Encourage patient to walk with a friend to allow for greater support in case the surface is uneven, but encouraged her to get outside when she is able as long as she feels safe.   She will follow up with me virtually in about 2 weeks and will follow up in the office for labs in about 1 month.  Time spent during visit with patient: 12 minutes

## 2020-04-17 NOTE — Assessment & Plan Note (Signed)
Reassured that this was a mechanical fall.  Encourage patient to walk with a friend to allow for greater support in case the surface is uneven, but encouraged her to get outside when she is able as long as she feels safe.

## 2020-04-17 NOTE — Assessment & Plan Note (Deleted)
Well-controlled currently.  Congratulated patient on her continued use of coping mechanisms.

## 2020-04-17 NOTE — Assessment & Plan Note (Signed)
Well-controlled currently on Zoloft and buspirone.  Patient has also improved on her use of lifestyle modifications and coping mechanisms.

## 2020-04-30 ENCOUNTER — Telehealth (INDEPENDENT_AMBULATORY_CARE_PROVIDER_SITE_OTHER): Payer: Medicare Other | Admitting: Family Medicine

## 2020-04-30 DIAGNOSIS — Z91199 Patient's noncompliance with other medical treatment and regimen due to unspecified reason: Secondary | ICD-10-CM

## 2020-04-30 DIAGNOSIS — Z5329 Procedure and treatment not carried out because of patient's decision for other reasons: Secondary | ICD-10-CM

## 2020-05-01 NOTE — Progress Notes (Signed)
No answer or return call after 2 attempts.

## 2020-05-14 ENCOUNTER — Ambulatory Visit: Payer: Medicare Other | Admitting: Family Medicine

## 2020-05-14 ENCOUNTER — Telehealth: Payer: Self-pay | Admitting: Family Medicine

## 2020-05-14 NOTE — Telephone Encounter (Signed)
Left messages on Ms. Koehl's home and cell numbers asking to make sure that she is okay since that she did not show for her appointment this afternoon, which is unusual for her.  She also was not available for her previous virtual appointment with me.  I asked her to call back, and we can reschedule at that time.  I also called her emergency contact, which is her son.  He says that she texted him today, so he does not believe anything is amiss and that she probably just forgot about this appointment.  He will call to check on her as well.

## 2020-05-20 ENCOUNTER — Ambulatory Visit (INDEPENDENT_AMBULATORY_CARE_PROVIDER_SITE_OTHER): Payer: Medicare Other | Admitting: Family Medicine

## 2020-05-20 ENCOUNTER — Other Ambulatory Visit: Payer: Self-pay

## 2020-05-20 VITALS — BP 142/80 | HR 83 | Ht 60.0 in | Wt 192.2 lb

## 2020-05-20 DIAGNOSIS — Z794 Long term (current) use of insulin: Secondary | ICD-10-CM

## 2020-05-20 DIAGNOSIS — E1143 Type 2 diabetes mellitus with diabetic autonomic (poly)neuropathy: Secondary | ICD-10-CM | POA: Diagnosis not present

## 2020-05-20 DIAGNOSIS — G4700 Insomnia, unspecified: Secondary | ICD-10-CM

## 2020-05-20 LAB — POCT GLYCOSYLATED HEMOGLOBIN (HGB A1C): HbA1c, POC (controlled diabetic range): 6.5 % (ref 0.0–7.0)

## 2020-05-20 MED ORDER — GABAPENTIN 100 MG PO CAPS: 100.0000 mg | ORAL_CAPSULE | Freq: Every day | ORAL | 0 refills | Status: DC

## 2020-05-20 NOTE — Assessment & Plan Note (Signed)
Acute on chronic.  We will start gabapentin 100 mg nightly since this medication has a relatively low side effect profile, and it has plenty of room to increase the dose in the future.

## 2020-05-20 NOTE — Patient Instructions (Signed)
It was nice meeting you today Ms. Micheals!  If you have any questions or concerns, please feel free to call the clinic.   Be well,  Dr. Shan Levans

## 2020-05-20 NOTE — Progress Notes (Signed)
Patient given PHQ2 , PHQ9 and Gad 7.  Provider aware.  Ozella Almond, [place]

## 2020-05-20 NOTE — Assessment & Plan Note (Addendum)
Continues to be well controlled today with an A1c of 6.5.  I reviewed with her that hemoglobin A1c's are recommended to be below 8 rather than 7 in patients her age, and there is little evidence that tight control improves outcomes in geriatric patients.  I also spoke with her about the benefits of an SGLT2 inhibitor if she would like to try this.  However, patient wants to continue her regimen as it is because she feels better when her glucose is tightly controlled.  I agreed that we can continue with this since she has not experienced any low blood sugars, and keeping her anxiety under control is also important.

## 2020-05-20 NOTE — Progress Notes (Signed)
SUBJECTIVE:   CHIEF COMPLAINT / HPI:   Difficulty sleeping Has trouble falling and staying asleep.  She says that it takes her several hours to fall asleep every night, and she wakes up in the middle of the night several times.  She only needs to urinate once per night.  She has tried trazodone and melatonin, neither of which were helpful.  She says that her mind races at night, although she is not necessarily anxious.  She continues to watch TV before bedtime and spends a lot of her time during the day in her bedroom.  She has a CPAP but does not always wear it because it is uncomfortable and makes getting up at night to urinate more difficult.  Type 2 Diabetes Mellitus: CBGs: ranges from low to mid 100s fasting and during the day Medications: Lantus 65 units daily, metformin 500 mg once daily, Novolog 7-8 units  If blood sugar is 200 Hypo/hyperglycemic symptoms: none Diet and exercise: walking, eating plenty of vegetables  PERTINENT  PMH / PSH: Anxiety, type 2 diabetes, hypertension, HFpEF  OBJECTIVE:   BP (!) 142/80   Pulse 83   Ht 5' (1.524 m)   Wt 192 lb 3.2 oz (87.2 kg)   LMP  (LMP Unknown)   SpO2 92%   BMI 37.54 kg/m   General: well appearing, appears stated age Cardiac: RRR, 1/6 systolic murmur Respiratory: CTAB, no rhonchi, rales, or wheezing, normal work of breathing Skin: no rashes or other lesions, warm and well perfused Psych: appropriate mood and affect  Depression screen [facility] 2/9 05/20/2020  Decreased Interest 0  Down, Depressed, Hopeless 0  PHQ - 2 Score 0  Altered sleeping 1  Tired, decreased energy 1  Change in appetite 0  Feeling bad or failure about yourself  0  Trouble concentrating 0  Moving slowly or fidgety/restless 0  Suicidal thoughts 0  PHQ-9 Score 2  Difficult doing work/chores Not difficult at all  Some recent data might be hidden   GAD 7 : Generalized Anxiety Score 05/20/2020 05/16/2018 04/24/2018 05/25/2016  Nervous, Anxious, on Edge 1 1 1  0    Control/stop worrying 0 1 1 1   Worry too much - different things 1 1 1 1   Trouble relaxing 1 2 2 1   Restless 0 0 0 0  Easily annoyed or irritable 0 1 1 0  Afraid - awful might happen 0 0 0 0  Total GAD 7 Score 3 6 6 3   Anxiety Difficulty Not difficult at all Not difficult at all Not difficult at all Somewhat difficult    ASSESSMENT/PLAN:   Diabetes mellitus, type II (HCC) Continues to be well controlled today with an A1c of 6.5.  I reviewed with her that hemoglobin A1c's are recommended to be below 8 rather than 7 in patients her age, and there is little evidence that tight control improves outcomes in geriatric patients.  I also spoke with her about the benefits of an SGLT2 inhibitor if she would like to try this.  However, patient wants to continue her regimen as it is because she feels better when her glucose is tightly controlled.  I agreed that we can continue with this since she has not experienced any low blood sugars, and keeping her anxiety under control is also important.  INSOMNIA Acute on chronic.  We will start gabapentin 100 mg nightly since this medication has a relatively low side effect profile, and it has plenty of room to increase the dose in  the future.   Will check a lipid panel today.  Kathrene Alu, MD [place]

## 2020-05-21 LAB — LIPID PANEL
Chol/HDL Ratio: 3.9 ratio (ref 0.0–4.4)
Cholesterol, Total: 197 mg/dL (ref 100–199)
HDL: 51 mg/dL (ref >39)
LDL Chol Calc (NIH): 115 mg/dL — ABNORMAL HIGH (ref 0–99)
Triglycerides: 176 mg/dL — ABNORMAL HIGH (ref 0–149)
VLDL Cholesterol Cal: 31 mg/dL (ref 5–40)

## 2020-05-22 ENCOUNTER — Other Ambulatory Visit: Payer: Self-pay | Admitting: Family Medicine

## 2020-05-22 DIAGNOSIS — R490 Dysphonia: Secondary | ICD-10-CM

## 2020-05-22 MED ORDER — LOVASTATIN ER 60 MG PO TB24: 60.0000 mg | ORAL_TABLET | Freq: Every day | ORAL | 3 refills | Status: DC

## 2020-05-22 NOTE — Progress Notes (Signed)
Discussed with Rebecca Walter that her cholesterol is slightly elevated and that increasing her lovastatin dose would reduce her stroke/heart attack risk according to the ASCVD calculator.  She agrees to increase her lovastatin dose to 60 mg daily.

## 2020-05-23 ENCOUNTER — Ambulatory Visit: Payer: Medicare Other | Admitting: Family Medicine

## 2020-05-26 ENCOUNTER — Other Ambulatory Visit: Payer: Self-pay | Admitting: Family Medicine

## 2020-05-27 ENCOUNTER — Telehealth: Payer: Self-pay

## 2020-05-27 NOTE — Telephone Encounter (Signed)
Patient calls nurse line regarding increased price of lovastatin. Due to the increase from 40 to 60 mg, patient reports issues with being able to afford medication. Spoke with Ailene Ravel at the pharmacy. Lovastatin is not made in 60 mg, therefore, name brand has to be dispensed. Patient states that it will cost her $135 for a 3 month supply. Patient has already picked up most recent rx. Please advise if there is another statin that patient could switch to that is more cost efficient.   To PCP  Talbot Grumbling, RN

## 2020-05-28 ENCOUNTER — Telehealth: Payer: Self-pay | Admitting: Family Medicine

## 2020-05-28 NOTE — Telephone Encounter (Signed)
[facility] Emergency After Hours Line:  Patient calling because she is having shortness of breath starting since last night. She notes that this comes and goes usually tied to her anxiety. She has tried breathing exercises which helps some. She also has acid reflux which she feels is also contributing. Patient has PMH of CHF. She sleeps on 3 pillows and this is unchanged. She denies any LE swelling. Denies PND. Patient notes she weight herself this AM - baseline 186lbs, weight today 188. Denies palpitations or chest pain. Denies any worsening exertional dyspnea. Patient has history of anxiety. She notes that she had an episode of difficulty swallowing which is chronic and occurs occasionally with her reflux. When this happens she gets anxious making her feel out of breath. She notes that talking to the provider is already making her feel better. She notes she often has to speak to her PCP in order to calm her nerves.  Assessment/Plan:  SOB appears to be related to her chronic anxiety. No red flag symptoms to indicate need for urgent evaluation. Plan as below. - continue breathing exercises and relaxing techniques  - continue Pepcid/Protonix - may try Pepto, Tums, or Gaviscon for break through reflux - For her anxiety, can take an additional Buspar  - strict return precautions discussed at length including chest pain, worsening SOB, worsening orthopnea/PND or >3lb weight gain. Patient voiced understanding and agreement with plan - will send message to PCP to reach out to patient (per patient request) at [facility] convenience

## 2020-05-28 NOTE — Telephone Encounter (Signed)
Covering for Dr. Shan Levans while she is out for the week.  Patient may increase lovastatin to 80 mg by taking 2 of the 40 mg pills so that it is more affordable.  Otherwise, I recommend waiting until Dr. Shan Levans returns next week.  [place] Daryle Amis, DO PGY-3, [facility]

## 2020-05-29 ENCOUNTER — Ambulatory Visit (HOSPITAL_COMMUNITY)
Admission: EM | Admit: 2020-05-29 | Discharge: 2020-05-29 | Disposition: A | Discharge status: Home / Self Care | Payer: Medicare Other | Attending: Urgent Care | Admitting: Urgent Care

## 2020-05-29 ENCOUNTER — Other Ambulatory Visit: Payer: Self-pay

## 2020-05-29 ENCOUNTER — Encounter (HOSPITAL_COMMUNITY): Payer: Self-pay | Admitting: Emergency Medicine

## 2020-05-29 DIAGNOSIS — R070 Pain in throat: Secondary | ICD-10-CM | POA: Diagnosis not present

## 2020-05-29 DIAGNOSIS — K219 Gastro-esophageal reflux disease without esophagitis: Secondary | ICD-10-CM

## 2020-05-29 DIAGNOSIS — K224 Dyskinesia of esophagus: Secondary | ICD-10-CM

## 2020-05-29 MED ORDER — LIDOCAINE VISCOUS HCL 2 % MT SOLN
OROMUCOSAL | Status: AC
  Filled 2020-05-29: qty 15

## 2020-05-29 MED ORDER — ALUM & MAG HYDROXIDE-SIMETH 200-200-20 MG/5ML PO SUSP
ORAL | Status: AC
  Filled 2020-05-29: qty 30

## 2020-05-29 MED ORDER — ALUMINUM-MAGNESIUM-SIMETHICONE 200-200-20 MG/5ML PO SUSP: 15.0000 mL | Freq: Two times a day (BID) | ORAL | 0 refills | Status: DC | PRN

## 2020-05-29 MED ORDER — LIDOCAINE VISCOUS HCL 2 % MT SOLN
15.0000 mL | Freq: Two times a day (BID) | OROMUCOSAL | 0 refills | Status: DC | PRN
Start: 2020-05-29 — End: 2020-06-17

## 2020-05-29 MED ORDER — ALUM & MAG HYDROXIDE-SIMETH 200-200-20 MG/5ML PO SUSP
30.0000 mL | Freq: Once | ORAL | Status: AC
  Administered 2020-05-29: 30 mL via ORAL

## 2020-05-29 MED ORDER — LIDOCAINE VISCOUS HCL 2 % MT SOLN
15.0000 mL | Freq: Once | OROMUCOSAL | Status: AC
  Administered 2020-05-29: 15 mL via ORAL

## 2020-05-29 NOTE — Discharge Instructions (Addendum)
Contact your GI doctor for a recheck. In the meantime, keep using your regular medications. If you develop the same symptoms again, then try the mix of Maalox with viscous lidocaine. Should total to 52mL between the two. Use this only if you need it after a meal. Otherwise, do not use it. If you develop chest pain, shortness of breath, facial swelling, oral swelling then report to the ER.

## 2020-05-29 NOTE — ED Provider Notes (Signed)
[place]   MRN: [id number] DOB: 06/28/1941  Subjective:   Rebecca Walter is a 79 y.o. female presenting for acute onset today of throat discomfort after she ate her lunch.  Patient states that she has a history of GERD and feels like she cannot swallow correctly.  Has had difficulty with this in the past.  She takes famotidine and Protonix consistently.  Has a history of esophageal dyskinesia.  Has had endoscopies that were reassuring.  Denies fever, inability to control secretions, chest pain, shortness of breath, nausea, vomiting, belly pain.  No current facility-administered medications for this encounter.  Current Outpatient Medications:  .  acetaminophen (TYLENOL) 325 MG tablet, Take 325 mg by mouth every 8 (eight) hours as needed (pain). , Disp: , Rfl:  .  allopurinol (ZYLOPRIM) 100 MG tablet, TAKE 2 TABLETS BY MOUTH EVERY DAY, Disp: 60 tablet, Rfl: 8 .  amLODipine (NORVASC) 10 MG tablet, TAKE 1 TABLET BY MOUTH EVERY DAY, Disp: 90 tablet, Rfl: 3 .  amLODipine (NORVASC) 2.5 MG tablet, Take 2.5 mg by mouth 2 (two) times daily as needed (elevated blood pressure)., Disp: , Rfl:  .  aspirin 81 MG chewable tablet, Chew 1 tablet (81 mg total) by mouth daily., Disp: 30 tablet, Rfl: 5 .  baclofen (LIORESAL) 10 MG tablet, Take 1 tablet (10 mg total) by mouth 2 (two) times daily., Disp: 30 each, Rfl: 2 .  Blood Glucose Monitoring Suppl (ACCU-CHEK AVIVA PLUS) w/Device KIT, 1 Device by Does not apply route daily., Disp: 1 kit, Rfl: 3 .  busPIRone (BUSPAR) 15 MG tablet, TAKE 1 TABLET (15 MG TOTAL) BY MOUTH 2 (TWO) TIMES DAILY., Disp: 60 tablet, Rfl: 11 .  Camphor-Eucalyptus-Menthol (VICKS VAPORUB EX), Place 1 application into both nostrils daily as needed (congestion)., Disp: , Rfl:  .  clotrimazole (LOTRIMIN) 1 % cream, Apply 1 application topically 2 (two) times daily., Disp: 30 g, Rfl: 0 .  famotidine (PEPCID) 20 MG tablet, Take 20 mg by mouth daily., Disp: , Rfl:  .  gabapentin  (NEURONTIN) 100 MG capsule, Take 1 capsule (100 mg total) by mouth at bedtime., Disp: 90 capsule, Rfl: 0 .  glucose blood (ACCU-CHEK AVIVA PLUS) test strip, CHECK BLOOD SUGAR FIRST  THING IN THE MORNING BEFORE EATING, AFTER LUNCH, AND  AFTER DINNER  TOTAL 3 TIMES DAILY, Disp: 300 strip, Rfl: 3 .  hydrocortisone 2.5 % cream, Apply topically 2 (two) times daily., Disp: 30 g, Rfl: 0 .  insulin aspart (NOVOLOG FLEXPEN) 100 UNIT/ML FlexPen, Inject 9-18 Units into the skin See admin instructions. Inject 15 units subcutaneously daily with breakfast, inject 9 units with lunch as needed for CBG 180 or more, and inject 9 units with supper, Disp: 15 mL, Rfl: 3 .  Insulin Glargine (LANTUS SOLOSTAR) 100 UNIT/ML Solostar Pen, Inject 65 Units into the skin daily., Disp: 15 mL, Rfl: 6 .  Insulin Pen Needle (B-D ULTRAFINE III SHORT PEN) 31G X 8 MM MISC, USE TO INJECT INSULIN 4 TIMES DAILY OR AS DIRECTED., Disp: 100 each, Rfl: 5 .  loratadine (CLARITIN) 10 MG tablet, Take 10 mg by mouth daily., Disp: , Rfl:  .  lovastatin (ALTOPREV) 60 MG 24 hr tablet, Take 1 tablet (60 mg total) by mouth at bedtime., Disp: 90 tablet, Rfl: 3 .  metFORMIN (GLUCOPHAGE-XR) 500 MG 24 hr tablet, Take 500 mg by mouth daily with breakfast., Disp: , Rfl:  .  Multiple Vitamin (MULTIVITAMIN WITH MINERALS) TABS tablet, Take 1 tablet by mouth daily.,  Disp: , Rfl:  .  pantoprazole (PROTONIX) 40 MG tablet, TAKE 1 TABLET BY MOUTH TWICE A DAY, Disp: 180 tablet, Rfl: 1 .  rOPINIRole (REQUIP) 0.25 MG tablet, TAKE 1 TABLET (0.25 MG TOTAL) BY MOUTH AT BEDTIME., Disp: 90 tablet, Rfl: 0 .  sertraline (ZOLOFT) 100 MG tablet, Take 1 tablet (100 mg total) by mouth daily., Disp: 30 tablet, Rfl: 11 .  sertraline (ZOLOFT) 50 MG tablet, TAKE 1 TABLET BY MOUTH 2 (TWO) TIMES DAILY. ONE DOSE IN THE AFTERNOON AND A SECOND BEFORE BED., Disp: 60 tablet, Rfl: 3 .  spironolactone (ALDACTONE) 25 MG tablet, Take 1 tablet (25 mg total) by mouth daily., Disp: 90 tablet, Rfl:  3 .  torsemide (DEMADEX) 10 MG tablet, Take 1 tablet (10 mg total) by mouth daily as needed., Disp: 30 tablet, Rfl: 11 .  valsartan (DIOVAN) 320 MG tablet, Take 1 tablet (320 mg total) by mouth daily., Disp: 30 tablet, Rfl: 11   Allergies  Allergen Reactions  . Baclofen Other (See Comments)    Caused leg weakness  . Hctz [Hydrochlorothiazide] Other (See Comments)    Uric acid elevation and gout.     . Lotensin [Benazepril] Other (See Comments)    Dry cough   . Victoza [Liraglutide] Other (See Comments)    Tongue Glossitus  . Acyclovir And Related Diarrhea  . Beta Adrenergic Blockers Other (See Comments)    Occurred with metoprolol  REACTION: coughing  . Losartan Diarrhea  . Other Diarrhea and Other (See Comments)    Occurred with metoprolol  REACTION: coughing    Past Medical History:  Diagnosis Date  . Anxiety   . Arthritis   . Chronic diastolic CHF (congestive heart failure) (HCC)    Echocardiogram 01/2020: EF 70-75, no RWMA, Gr 1 DD, normal RVSF, RVSP 45.8, mild to mod LAE, trivial MR, mild PI  . Diabetes mellitus   . GERD (gastroesophageal reflux disease)   . Gout   . Hyperlipidemia   . Hypertension   . Hypertensive heart disease with CHF (congestive heart failure) ([place])   . Mild aortic stenosis 06/2017  . Mild mitral regurgitation 06/2017  . Moderate pulmonary valve insufficiency 06/2017  . Osteopenia      Past Surgical History:  Procedure Laterality Date  . KIDNEY SURGERY    . KNEE SURGERY    . MENISECTOMY    . WRIST SURGERY      Family History  Problem Relation Age of Onset  . Stroke Mother   . Heart disease Father   . Stroke Father   . Hypertension Father   . Cancer Sister   . Cancer Brother   . Heart disease Brother   . Diabetes Maternal Uncle     Social History   Tobacco Use  . Smoking status: Never Smoker  . Smokeless tobacco: Never Used  Vaping Use  . Vaping Use: Never used  Substance Use Topics  . Alcohol use: No  . Drug use: No     ROS   Objective:   Vitals: BP (!) 168/68 (BP Location: Left Arm)   Pulse 80   Temp 98.5 F (36.9 C) (Oral)   Resp 18   LMP  (LMP Unknown)   SpO2 96%   Physical Exam Constitutional:      General: She is not in acute distress.    Appearance: Normal appearance. She is well-developed and normal weight. She is not ill-appearing, toxic-appearing or diaphoretic.  HENT:     Head: Normocephalic and atraumatic.  Right Ear: External ear normal.     Left Ear: External ear normal.     Nose: Nose normal.     Mouth/Throat:     Mouth: Mucous membranes are moist.     Pharynx: Oropharynx is clear.  Eyes:     General: No scleral icterus.    Extraocular Movements: Extraocular movements intact.     Pupils: Pupils are equal, round, and reactive to light.  Cardiovascular:     Rate and Rhythm: Normal rate and regular rhythm.     Pulses: Normal pulses.     Heart sounds: Normal heart sounds. No murmur heard.  No friction rub. No gallop.   Pulmonary:     Effort: Pulmonary effort is normal. No accessory muscle usage or respiratory distress.     Breath sounds: Normal breath sounds. No stridor. No decreased breath sounds, wheezing, rhonchi or rales.     Comments: No stridor. Abdominal:     General: Bowel sounds are normal. There is no distension.     Palpations: Abdomen is soft. There is no mass.     Tenderness: There is no abdominal tenderness. There is no right CVA tenderness, left CVA tenderness, guarding or rebound.  Skin:    General: Skin is warm and dry.     Coloration: Skin is not pale.     Findings: No rash.  Neurological:     General: No focal deficit present.     Mental Status: She is alert and oriented to person, place, and time.  Psychiatric:        Mood and Affect: Mood normal.        Behavior: Behavior normal.        Thought Content: Thought content normal.        Judgment: Judgment normal.      Assessment and Plan :   PDMP not reviewed this encounter.  1.  Throat discomfort   2. Esophageal dyskinesia   3. Gastroesophageal reflux disease, unspecified whether esophagitis present   4. Esophageal spasm     Patient given GI cocktail with dramatic improvement.  Recommended supportive care at home, counseled on appropriate use of the GI cocktail at home.  Emphasized need for follow-up as soon as possible with her GI doctor.  Strict ER precautions. Counseled patient on potential for adverse effects with medications prescribed today, patient verbalized understanding.    Jaynee Eagles, PA-C 05/29/20 1710

## 2020-05-29 NOTE — ED Triage Notes (Signed)
Patient presents to urgent care today with symptoms of SOB and GERD. Symptoms began on this afternoon after eating lunch. Patient states she felt like her throat was closing and had some anxiety.

## 2020-05-30 NOTE — Telephone Encounter (Signed)
Patient contacted and informed to take (2) 40mg , instead of (1) 60mg , to save on costs. Patient agrees with plan and will call us when she needs a refill on 40mg .

## 2020-06-04 ENCOUNTER — Other Ambulatory Visit: Payer: Self-pay | Admitting: *Deleted

## 2020-06-04 DIAGNOSIS — E118 Type 2 diabetes mellitus with unspecified complications: Secondary | ICD-10-CM

## 2020-06-04 DIAGNOSIS — Z794 Long term (current) use of insulin: Secondary | ICD-10-CM

## 2020-06-04 MED ORDER — NOVOLOG FLEXPEN 100 UNIT/ML ~~LOC~~ SOPN: 9.0000 [IU] | PEN_INJECTOR | SUBCUTANEOUS | 3 refills | Status: DC

## 2020-06-13 ENCOUNTER — Telehealth: Payer: Self-pay | Admitting: *Deleted

## 2020-06-13 NOTE — Telephone Encounter (Signed)
Received fax requesting refill on trazadone 50 mg tablet but I did not see on current med list. Anahla Bevis Katharina Caper, CMA

## 2020-06-16 NOTE — Telephone Encounter (Signed)
Second request received for the following:  Trazodone 50 MG tab Take 1 tablet by mouth everyday at bedtime Qty prescribed: 90 Refills: 3  Ottis Stain, CMA

## 2020-06-16 NOTE — Progress Notes (Signed)
Cardiology Office Note:    Date:  06/17/2020   ID:  Rebecca Walter, DOB [birth year], MRN [id number]  PCP:  Rebecca Alu, MD  Cardiologist:  Rebecca Carnes, MD   Electrophysiologist:  None   Referring MD: Rebecca Alu, MD   Chief Complaint:  Shortness of Breath    Patient Profile:    Rebecca Walter is a 79 y.o. female with:   Chronic Diastolic CHF ? Echocardiogram 01/2020: EF 70-75, Gr 1 DD  Valvular Heart Disease ? Mild AI, mild MR, mod PI  Pulmonary Hypertension  ? Echocardiogram 01/2020: RVSP 45.8  Hypertension  ? Hx of intol to carvedilol, clonidine  ? 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 Monitor: no sig arrhythmia; Carotid US: mild bilat plaque (no sig dz)  Myoview 9/18: Low Risk, small ant-apical ischemia vs shifting breast atten; asymptomatic >> Med Rx  Prior CV Studies: Echocardiogram 01/29/2020 EF 70-75, no RWMA, Gr 1 DD, mild RVE, normal RVSF, RVSP 45.8, mild to mod LAE, trivial MR, mild PI  LT Cardiac Monitor 12/2019 Sinus rhythm 28 to 160 bpm Average 59 bpm Occasional PVCs, PAC Rare burst NSVT Longest 8 beats  Carotid US 12/12/2019 Bilat ICA 1-39; L vertebral artery stenosis, R subclavian stenosis  Myoview 09/06/17 EF 67, small area of apicoseptal ischemia; low risk  Echocardiogram 07/16/17 Mild LVH, EF 60-65, Gr 1 DD, mild AI, mild MR, mild LAE, mod PI, PASP 35 Trivial eff  History of Present Illness:    Rebecca Walter was last seen via Telemedicine in 03/2020.  She returns for the evaluation of shortness of breath.  She is here alone.  It is not entirely clear how long she has noted worsening shortness of breath.  This seems to occur with any type of activity.  She has not had chest discomfort.  She sleeps on 3  pillows chronically.  She has not had PND.  She has had a 3 pound weight gain recently.  She has taken extra torsemide for this.  She has not had syncope.  She does note a dry cough.  She has been to the urgent care for hoarseness and has an appointment pending with ENT.  She also has a lot of acid reflux symptoms.  These seem to be controlled on her current medical regimen.  Past Medical History:  Diagnosis Date  . Anxiety   . Arthritis   . Chronic diastolic CHF (congestive heart failure) (HCC)    Echocardiogram 01/2020: EF 70-75, no RWMA, Gr 1 DD, normal RVSF, RVSP 45.8, mild to mod LAE, trivial MR, mild PI  . Diabetes mellitus   . GERD (gastroesophageal reflux disease)   . Gout   . Hyperlipidemia   . Hypertension   . Hypertensive heart disease with CHF (congestive heart failure) ([facility])   . Mild aortic stenosis 06/2017  . Mild mitral regurgitation 06/2017  . Moderate pulmonary valve insufficiency 06/2017  . Osteopenia     Current Medications: Current Meds  Medication Sig  . acetaminophen (TYLENOL) 325 MG tablet Take 325 mg by mouth every 8 (eight) hours as needed (pain).   Marland Kitchen allopurinol (ZYLOPRIM) 100 MG tablet TAKE 2 TABLETS BY MOUTH EVERY DAY  . aluminum-magnesium hydroxide-simethicone (MAALOX) 254-270-62 MG/5ML SUSP Take 15  mLs by mouth 2 (two) times daily as needed.  Marland Kitchen amLODipine (NORVASC) 10 MG tablet TAKE 1 TABLET BY MOUTH EVERY DAY  . amLODipine (NORVASC) 2.5 MG tablet Take 2.5 mg by mouth 2 (two) times daily as needed (elevated blood pressure).  Marland Kitchen aspirin 81 MG chewable tablet Chew 1 tablet (81 mg total) by mouth daily.  . baclofen (LIORESAL) 10 MG tablet Take 1 tablet (10 mg total) by mouth 2 (two) times daily.  . busPIRone (BUSPAR) 15 MG tablet TAKE 1 TABLET (15 MG TOTAL) BY MOUTH 2 (TWO) TIMES DAILY.  Marland Kitchen Camphor-Eucalyptus-Menthol (VICKS VAPORUB EX) Place 1 application into both nostrils daily as needed (congestion).  . famotidine (PEPCID) 20 MG tablet Take 20 mg by mouth  daily.  . hydrocortisone 2.5 % cream Apply topically 2 (two) times daily.  . insulin aspart (NOVOLOG FLEXPEN) 100 UNIT/ML FlexPen Inject 9-18 Units into the skin See admin instructions. Inject 15 units subcutaneously daily with breakfast, inject 9 units with lunch as needed for CBG 180 or more, and inject 9 units with supper  . Insulin Glargine (LANTUS SOLOSTAR) 100 UNIT/ML Solostar Pen Inject 65 Units into the skin daily.  Marland Kitchen loratadine (CLARITIN) 10 MG tablet Take 10 mg by mouth daily.  Marland Kitchen lovastatin (ALTOPREV) 60 MG 24 hr tablet Take 1 tablet (60 mg total) by mouth at bedtime.  . metFORMIN (GLUCOPHAGE-XR) 500 MG 24 hr tablet Take 500 mg by mouth daily with breakfast.  . Multiple Vitamin (MULTIVITAMIN WITH MINERALS) TABS tablet Take 1 tablet by mouth daily.  . pantoprazole (PROTONIX) 40 MG tablet TAKE 1 TABLET BY MOUTH TWICE A DAY  . rOPINIRole (REQUIP) 0.25 MG tablet TAKE 1 TABLET (0.25 MG TOTAL) BY MOUTH AT BEDTIME.  Marland Kitchen sertraline (ZOLOFT) 100 MG tablet Take 1 tablet (100 mg total) by mouth daily.  . sertraline (ZOLOFT) 50 MG tablet Take 50 mg by mouth at bedtime.  Marland Kitchen spironolactone (ALDACTONE) 25 MG tablet Take 1 tablet (25 mg total) by mouth daily.  Marland Kitchen torsemide (DEMADEX) 10 MG tablet Take 1 tablet (10 mg total) by mouth daily as needed.  . valsartan (DIOVAN) 320 MG tablet Take 1 tablet (320 mg total) by mouth daily.     Allergies:   Baclofen, Hctz [hydrochlorothiazide], Lotensin [benazepril], Victoza [liraglutide], Acyclovir and related, Beta adrenergic blockers, Losartan, and Other   Social History   Tobacco Use  . Smoking status: Never Smoker  . Smokeless tobacco: Never Used  Vaping Use  . Vaping Use: Never used  Substance Use Topics  . Alcohol use: No  . Drug use: No     Family Hx: The patient's family history includes Cancer in her brother and sister; Diabetes in her maternal uncle; Heart disease in her brother and father; Hypertension in her father; Stroke in her father and  mother.  ROS   EKGs/Labs/Other Test Reviewed:    EKG:  EKG is  ordered today.  The ekg ordered today demonstrates normal sinus rhythm, heart rate 64, normal axis, nonspecific ST-T wave changes, QTC 412, no change from prior tracing  Recent Labs: 01/11/2020: ALT 21; BNP 17.5; BUN 40; Creatinine, Ser 1.09; Hemoglobin 12.2; Platelets 223; Potassium 4.5; Sodium 137; TSH 4.960   Recent Lipid Panel Lab Results  Component Value Date/Time   CHOL 197 05/20/2020 05:28 PM   TRIG 176 (H) 05/20/2020 05:28 PM   HDL 51 05/20/2020 05:28 PM   CHOLHDL 3.9 05/20/2020 05:28 PM   CHOLHDL 4.2 07/16/2017 04:02 AM   LDLCALC 115 (H) 05/20/2020  05:28 PM   LDLDIRECT 103 (H) 03/11/2010 09:27 PM    Physical Exam:    VS:  BP (!) 116/50   Pulse 64   Ht 5' (1.524 m)   Wt 193 lb (87.5 kg)   LMP  (LMP Unknown)   BMI 37.69 kg/m     Wt Readings from Last 3 Encounters:  06/17/20 193 lb (87.5 kg)  05/20/20 192 lb 3.2 oz (87.2 kg)  04/16/20 187 lb (84.8 kg)     Constitutional:      Appearance: Healthy appearance. Not in distress.  Neck:     Lymphadenopathy: No cervical adenopathy.  Pulmonary:     Breath sounds: No wheezing. No rales.     Comments: Decreased breath sounds Cardiovascular:     Normal rate. Regular rhythm. Normal S1. Normal S2.     Murmurs: There is a grade 1/6 early systolic murmur at the URSB.  Edema:    Pretibial: bilateral trace edema of the pretibial area. Abdominal:     Palpations: Abdomen is soft.  Musculoskeletal:     Cervical back: Neck supple. Skin:    General: Skin is warm and dry.  Neurological:     General: No focal deficit present.     Mental Status: Alert and oriented to person, place and time.       ASSESSMENT & PLAN:    1. Shortness of breath 2. Cough The patient presents for further evaluation of shortness of breath.  It is really not clear how long she has been more short of breath than usual.  She is fairly sedentary.  However, her symptoms seem to mainly  occur with any type of activity.  Overall, her volume status appears stable.  She has had a nonproductive cough.  She has had some hoarseness and has an evaluation with ENT soon.  She does have a CAD equivalent with diabetes mellitus.  It has been 3 years since her last stress test.  Differential diagnosis includes ischemia versus CHF versus infection versus deconditioning.  We did discuss the possibility of medical therapy if her stress test is low to intermediate risk.  -Obtain BMET, BNP, CBC  -Arrange Lexiscan Myoview to rule out ischemia  -Obtain chest x-ray  -Follow-up 4 weeks via virtual visit  3. Chronic diastolic CHF (congestive heart failure) (HCC) Echocardiogram in February 2021 with EF 73-53 and mild diastolic dysfunction.  Overall, her volume status appears stable.  Obtain BNP as outlined above.  If her BNP is significant elevated, I will adjust her torsemide further.  4. Essential hypertension The patient's blood pressure is controlled on her current regimen.  Continue current therapy.    Dispo:  Return in about 4 weeks (around 07/15/2020) for Follow up after testing, w/ Dr. Harrington Challenger, or Richardson Dopp, PA-C, via Telemedicine.   Medication Adjustments/Labs and Tests Ordered: Current medicines are reviewed at length with the patient today.  Concerns regarding medicines are outlined above.  Tests Ordered: Orders Placed This Encounter  Procedures  . DG Chest 2 View  . Basic metabolic panel  . CBC  . Pro b natriuretic peptide (BNP)  . MYOCARDIAL PERFUSION IMAGING  . EKG 12-Lead   Medication Changes: No orders of the defined types were placed in this encounter.   Signed, Richardson Dopp, PA-C  06/17/2020 5:24 PM    [facility] [address], [place], [place]  [address] Phone: [phone]; Fax: [phone]

## 2020-06-17 ENCOUNTER — Encounter: Payer: Self-pay | Admitting: Physician Assistant

## 2020-06-17 ENCOUNTER — Telehealth: Payer: Medicare Other | Admitting: Family Medicine

## 2020-06-17 ENCOUNTER — Other Ambulatory Visit: Payer: Self-pay | Admitting: Family Medicine

## 2020-06-17 ENCOUNTER — Telehealth (INDEPENDENT_AMBULATORY_CARE_PROVIDER_SITE_OTHER): Payer: Medicare Other | Admitting: Family Medicine

## 2020-06-17 ENCOUNTER — Other Ambulatory Visit: Payer: Self-pay

## 2020-06-17 ENCOUNTER — Ambulatory Visit: Payer: Medicare Other | Admitting: Physician Assistant

## 2020-06-17 ENCOUNTER — Ambulatory Visit
Admission: RE | Admit: 2020-06-17 | Discharge: 2020-06-17 | Disposition: A | Discharge status: Home / Self Care | Payer: Medicare Other | Source: Ambulatory Visit | Attending: Physician Assistant | Admitting: Physician Assistant

## 2020-06-17 VITALS — BP 116/50 | HR 64 | Ht 60.0 in | Wt 193.0 lb

## 2020-06-17 DIAGNOSIS — I5032 Chronic diastolic (congestive) heart failure: Secondary | ICD-10-CM | POA: Diagnosis not present

## 2020-06-17 DIAGNOSIS — I10 Essential (primary) hypertension: Secondary | ICD-10-CM

## 2020-06-17 DIAGNOSIS — R05 Cough: Secondary | ICD-10-CM

## 2020-06-17 DIAGNOSIS — R0602 Shortness of breath: Secondary | ICD-10-CM | POA: Diagnosis not present

## 2020-06-17 DIAGNOSIS — R059 Cough, unspecified: Secondary | ICD-10-CM

## 2020-06-17 MED ORDER — TRAZODONE HCL 50 MG PO TABS: 25.0000 mg | ORAL_TABLET | Freq: Every evening | ORAL | 3 refills | Status: DC | PRN

## 2020-06-17 NOTE — Progress Notes (Signed)
No vitals taken at home.  .Rebecca Walter Bellany Elbaum, CMA  

## 2020-06-17 NOTE — Progress Notes (Signed)
Called patient twice at her appointment time without answer.  Left a voice message.

## 2020-06-17 NOTE — Patient Instructions (Addendum)
Medication Instructions:  Your physician recommends that you continue on your current medications as directed. Please refer to the Current Medication list given to you today.  *If you need a refill on your cardiac medications before your next appointment, please call your pharmacy*   Lab Work: BMET, BNP, CBC Today  If you have labs (blood work) drawn today and your tests are completely normal, you will receive your results only by: Marland Kitchen MyChart Message (if you have MyChart) OR . A paper copy in the mail If you have any lab test that is abnormal or we need to change your treatment, we will call you to review the results.   Testing/Procedures: Your physician has requested that you have a lexiscan myoview. For further information please visit [email or web address]. Please follow instruction sheet, as given.  A chest x-ray takes a picture of the organs and structures inside the chest, including the heart, lungs, and blood vessels. This test can show several things, including, whether the heart is enlarges; whether fluid is building up in the lungs; and whether pacemaker / defibrillator leads are still in place.    Follow-Up: At [facility] you and your health needs are our priority.  As part of our continuing mission to provide you with exceptional heart care, we have created designated Provider Care Teams.  These Care Teams include your primary Cardiologist (physician) and Advanced Practice Providers (APPs -  Physician Assistants and Nurse Practitioners) who all work together to provide you with the care you need, when you need it.  We recommend signing up for the patient portal called "MyChart".  Sign up information is provided on this After Visit Summary.  MyChart is used to connect with patients for Virtual Visits (Telemedicine).  Patients are able to view lab/test results, encounter notes, upcoming appointments, etc.  Non-urgent messages can be sent to your provider as well.   To learn more  about what you can do with MyChart, go to [email or web address].    Your next appointment:   07/21/2020 @ 11:15 with Richardson Dopp, PA  Other Instructions  Chest X-ray Instructions:    1. You may have this done at the [facility], located in the [facility] on the 1st floor.    2. You do no have to have an appointment.    3. [place], [place] [address]        [phone]        Monday - Friday  8:00 am - 5:00 pm

## 2020-06-18 ENCOUNTER — Encounter: Payer: Self-pay | Admitting: Family Medicine

## 2020-06-18 ENCOUNTER — Telehealth (INDEPENDENT_AMBULATORY_CARE_PROVIDER_SITE_OTHER): Payer: Medicare Other | Admitting: Family Medicine

## 2020-06-18 DIAGNOSIS — I5032 Chronic diastolic (congestive) heart failure: Secondary | ICD-10-CM

## 2020-06-18 DIAGNOSIS — F419 Anxiety disorder, unspecified: Secondary | ICD-10-CM | POA: Diagnosis not present

## 2020-06-18 LAB — CBC
Hematocrit: 38.1 % (ref 34.0–46.6)
Hemoglobin: 13 g/dL (ref 11.1–15.9)
MCH: 30.4 pg (ref 26.6–33.0)
MCHC: 34.1 g/dL (ref 31.5–35.7)
MCV: 89 fL (ref 79–97)
Platelets: 250 10*3/uL (ref 150–450)
RBC: 4.27 x10E6/uL (ref 3.77–5.28)
RDW: 13.4 % (ref 11.7–15.4)
WBC: 8.9 10*3/uL (ref 3.4–10.8)

## 2020-06-18 LAB — BASIC METABOLIC PANEL
BUN/Creatinine Ratio: 22 (ref 12–28)
BUN: 26 mg/dL (ref 8–27)
CO2: 26 mmol/L (ref 20–29)
Calcium: 9.7 mg/dL (ref 8.7–10.3)
Chloride: 98 mmol/L (ref 96–106)
Creatinine, Ser: 1.18 mg/dL — ABNORMAL HIGH (ref 0.57–1.00)
GFR calc Af Amer: 50 mL/min/{1.73_m2} — ABNORMAL LOW (ref >59)
GFR calc non Af Amer: 44 mL/min/{1.73_m2} — ABNORMAL LOW (ref >59)
Glucose: 100 mg/dL — ABNORMAL HIGH (ref 65–99)
Potassium: 5 mmol/L (ref 3.5–5.2)
Sodium: 140 mmol/L (ref 134–144)

## 2020-06-18 LAB — PRO B NATRIURETIC PEPTIDE: NT-Pro BNP: 180 pg/mL (ref 0–738)

## 2020-06-18 NOTE — Assessment & Plan Note (Signed)
According to the imaging and labs conducted at patient's cardiologist office, she does not appear to be in a heart failure exacerbation today.  Discussed with her that she should talk with her cardiologist about torsemide versus Demadex, although I did tell her that these medications are the same.

## 2020-06-18 NOTE — Assessment & Plan Note (Signed)
Appears to be well controlled today.  Encouraged patient to reach out to her new doctor and work together with her to find a schedule for checking in that will help keep her anxiety under control and will work for her new doctor.

## 2020-06-18 NOTE — Progress Notes (Signed)
[facility] Telemedicine Visit  Patient consented to have virtual visit and was identified by name and date of birth. Method of visit: Telephone  Encounter participants: Patient: Rebecca Walter - located at home Provider: Kathrene Alu - located at [facility] Others (if applicable): none  Chief Complaint: f/u shortness of breath, anxiety  HPI:  Shortness of breath Notes that she is having increased shortness of breath and her abdomen is increased in size.  This has been occurring for several months and seems to be getting worse.  She recently saw her cardiologist, and she had several tests performed, which were all normal.  She says that the brand name of torsemide worked better for her in the past and is interested in taking this again if possible.  Anxiety Patient says that her anxiety is doing all right currently.  She continues with sertraline 200 mg daily.  She says that it is important to her that her next doctor knows that she is trustworthy and taking her medications and knows her medical history well.  ROS: per HPI  Pertinent PMHx: Anxiety, diabetes, hypertension  Exam:  BP (!) 160/64 Comment: patient reported  Wt 188 lb (85.3 kg)   LMP  (LMP Unknown)   BMI 36.72 kg/m   Respiratory: No shortness of breath during conversation today, able to speak full sentences without difficulty  Assessment/Plan:  Anxiety Appears to be well controlled today.  Encouraged patient to reach out to her new doctor and work together with her to find a schedule for checking in that will help keep her anxiety under control and will work for her new doctor.  Chronic diastolic CHF (congestive heart failure) (HCC) According to the imaging and labs conducted at patient's cardiologist office, she does not appear to be in a heart failure exacerbation today.  Discussed with her that she should talk with her cardiologist about torsemide versus Demadex, although I did tell her that  these medications are the same.    Time spent during visit with patient: 20 minutes

## 2020-06-19 DIAGNOSIS — K219 Gastro-esophageal reflux disease without esophagitis: Secondary | ICD-10-CM | POA: Insufficient documentation

## 2020-06-24 ENCOUNTER — Telehealth (HOSPITAL_COMMUNITY): Payer: Self-pay | Admitting: *Deleted

## 2020-06-24 NOTE — Telephone Encounter (Signed)
Patient given detailed instructions per Myocardial Perfusion Study Information Sheet for the test on 06/26/20 at 10:30. Patient notified to arrive 15 minutes early and that it is imperative to arrive on time for appointment to keep from having the test rescheduled.  If you need to cancel or reschedule your appointment, please call the office within 24 hours of your appointment. . Patient verbalized understanding.Rebecca Walter

## 2020-06-25 ENCOUNTER — Other Ambulatory Visit: Payer: Self-pay | Admitting: Family Medicine

## 2020-06-26 ENCOUNTER — Other Ambulatory Visit: Payer: Self-pay

## 2020-06-26 ENCOUNTER — Encounter: Payer: Self-pay | Admitting: Physician Assistant

## 2020-06-26 ENCOUNTER — Ambulatory Visit (HOSPITAL_COMMUNITY): Payer: Medicare Other | Attending: Cardiovascular Disease

## 2020-06-26 DIAGNOSIS — R0602 Shortness of breath: Secondary | ICD-10-CM | POA: Insufficient documentation

## 2020-06-26 LAB — MYOCARDIAL PERFUSION IMAGING
LV dias vol: 83 mL (ref 46–106)
LV sys vol: 21 mL
Peak HR: 83 {beats}/min
Rest HR: 62 {beats}/min
SDS: 0
SRS: 0
SSS: 0
TID: 1.08

## 2020-06-26 MED ORDER — TECHNETIUM TC 99M TETROFOSMIN IV KIT
30.6000 | PACK | Freq: Once | INTRAVENOUS | Status: AC | PRN
  Administered 2020-06-26: 30.6 via INTRAVENOUS
  Filled 2020-06-26: qty 31

## 2020-06-26 MED ORDER — REGADENOSON 0.4 MG/5ML IV SOLN
0.4000 mg | Freq: Once | INTRAVENOUS | Status: AC
Start: 2020-06-26 — End: 2020-06-26
  Administered 2020-06-26: 0.4 mg via INTRAVENOUS

## 2020-06-26 MED ORDER — TECHNETIUM TC 99M TETROFOSMIN IV KIT
10.7000 | PACK | Freq: Once | INTRAVENOUS | Status: AC | PRN
  Administered 2020-06-26: 10.7 via INTRAVENOUS
  Filled 2020-06-26: qty 11

## 2020-06-27 ENCOUNTER — Telehealth: Payer: Self-pay | Admitting: Internal Medicine

## 2020-06-27 NOTE — Telephone Encounter (Signed)
New message   Patient states that she is nauseated , diarrhea , weakness since the patient had a mycardial perfusion done on yesterday. Please advise.

## 2020-06-27 NOTE — Telephone Encounter (Signed)
Spoke with the patient who states that she had a Lexiscan Myoview yesterday. Last night she started feeling weak. She states that she was also nauseous and was having diarrhea. When she woke up this morning she still was not feeling great but was able to drink some water and eat breakfast. She states that now she is feeling a lot better. Denies any chest pain or SOB. Advised patient to stay hydrated.  Results from stress test were also reviewed with patient. She verbalized understanding.

## 2020-07-02 ENCOUNTER — Encounter: Payer: Self-pay | Admitting: Podiatry

## 2020-07-02 ENCOUNTER — Other Ambulatory Visit: Payer: Self-pay

## 2020-07-02 ENCOUNTER — Ambulatory Visit: Payer: Medicare Other | Admitting: Podiatry

## 2020-07-02 DIAGNOSIS — M79675 Pain in left toe(s): Secondary | ICD-10-CM

## 2020-07-02 DIAGNOSIS — M2041 Other hammer toe(s) (acquired), right foot: Secondary | ICD-10-CM

## 2020-07-02 DIAGNOSIS — M79674 Pain in right toe(s): Secondary | ICD-10-CM | POA: Diagnosis not present

## 2020-07-02 DIAGNOSIS — E1143 Type 2 diabetes mellitus with diabetic autonomic (poly)neuropathy: Secondary | ICD-10-CM

## 2020-07-02 DIAGNOSIS — B351 Tinea unguium: Secondary | ICD-10-CM

## 2020-07-02 DIAGNOSIS — Z794 Long term (current) use of insulin: Secondary | ICD-10-CM

## 2020-07-02 DIAGNOSIS — M2042 Other hammer toe(s) (acquired), left foot: Secondary | ICD-10-CM

## 2020-07-04 ENCOUNTER — Other Ambulatory Visit: Payer: Self-pay | Admitting: Family Medicine

## 2020-07-05 NOTE — Progress Notes (Signed)
Subjective: Rebecca Walter presents today for preventative diabetic foot care and painful thick toenails that are difficult to trim. Pain interferes with ambulation. Aggravating factors include wearing enclosed shoe gear. Pain is relieved with periodic professional debridement.   She voices no new pedal concerns on today's visit.  Past Medical History:  Diagnosis Date  . Anxiety   . Arthritis   . Chronic diastolic CHF (congestive heart failure) (HCC)    Echocardiogram 01/2020: EF 70-75, no RWMA, Gr 1 DD, normal RVSF, RVSP 45.8, mild to mod LAE, trivial MR, mild PI  . Diabetes mellitus   . GERD (gastroesophageal reflux disease)   . Gout   . History of nuclear stress test    Myoview 06/2020: EF 75, no ischemia or infarction, low risk  . Hyperlipidemia   . Hypertension   . Hypertensive heart disease with CHF (congestive heart failure) ([place])   . Mild aortic stenosis 06/2017  . Mild mitral regurgitation 06/2017  . Moderate pulmonary valve insufficiency 06/2017  . Osteopenia     Patient Active Problem List   Diagnosis Date Noted  . Frequent falls 04/17/2020  . Candidiasis of skin 02/14/2020  . Epistaxis 02/04/2020  . Restless leg syndrome 01/23/2020  . Obstructive sleep apnea 12/04/2019  . Hoarseness 11/27/2019  . Leg weakness, bilateral 07/04/2019  . Dehydration 03/26/2019  . Hyponatremia   . Diabetic autonomic neuropathy ([place]) 01/24/2019  . Generalized weakness 12/15/2018  . Chronically dry eyes, right 12/15/2018  . Hypertension 10/17/2018  . Elevated TSH 11/21/2017  . Hypertensive heart disease with CHF (congestive heart failure) ([place])   . Chronic diastolic CHF (congestive heart failure) ([place]) 07/15/2017  . Mild aortic stenosis 06/19/2017  . Mild mitral regurgitation 06/19/2017  . Moderate pulmonary valve insufficiency 06/19/2017  . Lumbar back pain with radiculopathy affecting right lower extremity 10/29/2016  . Vertigo 08/29/2013  . Diarrhea 01/12/2013  . Allergic  rhinitis 03/22/2011  . INSOMNIA 09/16/2009  . GASTROPARESIS 08/12/2009  . Anxiety 10/18/2008  . DYSKINESIA, ESOPHAGUS 09/13/2007  . CARCINOMA, BASAL CELL 05/26/2007  . Diabetes mellitus, type II ([place]) 02/16/2007  . HLD (hyperlipidemia) 02/16/2007  . OBESITY, NOS 02/16/2007  . GASTROESOPHAGEAL REFLUX, NO ESOPHAGITIS 02/16/2007  . Osteoarthritis of both knees 02/16/2007  . Osteopenia after menopause 02/16/2007  . Gout 02/16/2007    Past Surgical History:  Procedure Laterality Date  . KIDNEY SURGERY    . KNEE SURGERY    . MENISECTOMY    . WRIST SURGERY      Current Outpatient Medications on File Prior to Visit  Medication Sig Dispense Refill  . amLODipine (NORVASC) 2.5 MG tablet Take 1 tablet (2.5 mg total) by mouth 2 (two) times daily as needed (For elevated blood pressure systolic >628 only). 60 tablet 1  . hydrALAZINE (APRESOLINE) 25 MG tablet Take 1 tablet by mouth 3 (three) times daily.    Marland Kitchen lidocaine (XYLOCAINE) 2 % solution     . acetaminophen (TYLENOL) 325 MG tablet Take 325 mg by mouth every 8 (eight) hours as needed (pain).     Marland Kitchen allopurinol (ZYLOPRIM) 100 MG tablet TAKE 2 TABLETS BY MOUTH EVERY DAY 180 tablet 2  . aluminum-magnesium hydroxide-simethicone (MAALOX) 315-176-16 MG/5ML SUSP Take 15 mLs by mouth 2 (two) times daily as needed. 500 mL 0  . amLODipine (NORVASC) 10 MG tablet TAKE 1 TABLET BY MOUTH EVERY DAY 90 tablet 3  . aspirin 81 MG chewable tablet Chew 1 tablet (81 mg total) by mouth daily. 30 tablet 5  .  baclofen (LIORESAL) 10 MG tablet Take 1 tablet (10 mg total) by mouth 2 (two) times daily. 30 each 2  . Blood Glucose Monitoring Suppl (ACCU-CHEK AVIVA PLUS) w/Device KIT 1 Device by Does not apply route daily. 1 kit 3  . busPIRone (BUSPAR) 15 MG tablet TAKE 1 TABLET (15 MG TOTAL) BY MOUTH 2 (TWO) TIMES DAILY. 60 tablet 11  . Camphor-Eucalyptus-Menthol (VICKS VAPORUB EX) Place 1 application into both nostrils daily as needed (congestion).    . famotidine  (PEPCID) 20 MG tablet Take 20 mg by mouth daily.    Marland Kitchen glucose blood (ACCU-CHEK AVIVA PLUS) test strip CHECK BLOOD SUGAR FIRST  THING IN THE MORNING BEFORE EATING, AFTER LUNCH, AND  AFTER DINNER  TOTAL 3 TIMES DAILY 300 strip 3  . hydrocortisone 2.5 % cream Apply topically 2 (two) times daily. 30 g 0  . insulin aspart (NOVOLOG FLEXPEN) 100 UNIT/ML FlexPen Inject 9-18 Units into the skin See admin instructions. Inject 15 units subcutaneously daily with breakfast, inject 9 units with lunch as needed for CBG 180 or more, and inject 9 units with supper 15 mL 3  . loratadine (CLARITIN) 10 MG tablet Take 10 mg by mouth daily.    Marland Kitchen lovastatin (ALTOPREV) 60 MG 24 hr tablet Take 1 tablet (60 mg total) by mouth at bedtime. 90 tablet 3  . metFORMIN (GLUCOPHAGE-XR) 500 MG 24 hr tablet Take 500 mg by mouth daily with breakfast.    . Multiple Vitamin (MULTIVITAMIN WITH MINERALS) TABS tablet Take 1 tablet by mouth daily.    . pantoprazole (PROTONIX) 40 MG tablet TAKE 1 TABLET BY MOUTH TWICE A DAY 180 tablet 1  . rOPINIRole (REQUIP) 0.25 MG tablet TAKE 1 TABLET (0.25 MG TOTAL) BY MOUTH AT BEDTIME. 90 tablet 0  . sertraline (ZOLOFT) 100 MG tablet Take 1 tablet (100 mg total) by mouth daily. 30 tablet 11  . sertraline (ZOLOFT) 50 MG tablet Take 50 mg by mouth at bedtime.    Marland Kitchen spironolactone (ALDACTONE) 25 MG tablet Take 1 tablet (25 mg total) by mouth daily. 90 tablet 3  . torsemide (DEMADEX) 10 MG tablet Take 1 tablet (10 mg total) by mouth daily as needed. 30 tablet 11  . traZODone (DESYREL) 50 MG tablet TAKE 1/2 1 TABLET BY MOUTH AT BEDTIME AS NEEDED FOR SLEEP    . valsartan (DIOVAN) 320 MG tablet Take 1 tablet (320 mg total) by mouth daily. 30 tablet 11  . [DISCONTINUED] busPIRone (BUSPAR) 15 MG tablet Take 1 tablet (15 mg total) by mouth 2 (two) times daily. 60 tablet 2  . [DISCONTINUED] busPIRone (BUSPAR) 15 MG tablet TAKE 1 TABLET (15 MG TOTAL) BY MOUTH 2 (TWO) TIMES DAILY. 60 tablet 2  . [DISCONTINUED]  glucose blood (ACCU-CHEK AVIVA PLUS) test strip CHECK BLOOD SUGAR FIRST  THING IN THE MORNING BEFORE EATING, AFTER LUNCH, AND  AFTER DINNER TOTAL 3 TIMES  DAILY 300 each 3  . [DISCONTINUED] sertraline (ZOLOFT) 50 MG tablet Take 1 tablet (50 mg total) by mouth 2 (two) times daily. One dose in the afternoon and a second before bed. 60 tablet 3  . [DISCONTINUED] torsemide (DEMADEX) 10 MG tablet TAKE 1 TABLET BY MOUTH EVERY DAY AS NEEDED 30 tablet 0  . [DISCONTINUED] torsemide (DEMADEX) 10 MG tablet Take 1 tablet (10 mg total) by mouth daily as needed. 30 tablet 0   No current facility-administered medications on file prior to visit.     Allergies  Allergen Reactions  . Baclofen Other (See  Comments)    Caused leg weakness  . Hctz [Hydrochlorothiazide] Other (See Comments)    Uric acid elevation and gout.     . Lotensin [Benazepril] Other (See Comments)    Dry cough   . Victoza [Liraglutide] Other (See Comments)    Tongue Glossitus  . Acyclovir And Related Diarrhea  . Beta Adrenergic Blockers Other (See Comments)    Occurred with metoprolol  REACTION: coughing  . Losartan Diarrhea  . Other Diarrhea and Other (See Comments)    Occurred with metoprolol  REACTION: coughing    Social History   Occupational History  . Occupation: retired-office work  Tobacco Use  . Smoking status: Never Smoker  . Smokeless tobacco: Never Used  Vaping Use  . Vaping Use: Never used  Substance and Sexual Activity  . Alcohol use: No  . Drug use: No  . Sexual activity: Not Currently    Family History  Problem Relation Age of Onset  . Stroke Mother   . Heart disease Father   . Stroke Father   . Hypertension Father   . Cancer Sister   . Cancer Brother   . Heart disease Brother   . Diabetes Maternal Uncle     Immunization History  Administered Date(s) Administered  . Influenza Split 11/05/2011, 09/20/2012  . Influenza Whole 11/15/2007, 10/24/2009, 09/25/2010  . Influenza, High Dose Seasonal  PF 09/07/2018  . Influenza,inj,Quad PF,6+ Mos 10/08/2014, 10/10/2015, 09/26/2017  . Influenza-Unspecified 10/19/2013, 10/03/2016, 10/26/2019  . PFIZER SARS-COV-2 Vaccination 02/22/2020, 03/25/2020  . Pneumococcal Conjugate-13 09/26/2017  . Pneumococcal Polysaccharide-23 10/20/2004  . Td 10/20/2002  . Tdap 11/21/2017     Objective: There were no vitals filed for this visit.  Kajuana C Kuehne is a pleasant 79 y.o. Caucasian female  IN NAD. AAO X 3.  Capillary refill time to digits immediate b/l. Palpable DP pulses b/l. Palpable PT pulses b/l. Pedal hair present b/l. Skin temperature gradient within normal limits b/l.  Pedal skin with normal turgor, texture and tone bilaterally. No open wounds bilaterally. No interdigital macerations bilaterally. Toenails 1-5 b/l elongated, dystrophic, thickened, crumbly with subungual debris and tenderness to dorsal palpation.  Normal muscle strength 5/5 to all lower extremity muscle groups bilaterally, no pain crepitus or joint limitation noted with ROM b/l and hammertoes noted to the  L 2nd toe and R 2nd toe.  Protective sensation intact 5/5 intact bilaterally with 10g monofilament b/l Vibratory sensation intact b/l Proprioception intact bilaterally.  Hemoglobin A1C Latest Ref Rng & Units 05/20/2020 01/11/2020 07/20/2019  HGBA1C 0.0 - 7.0 % 6.5 6.6 6.2  Some recent data might be hidden   Assessment: 1. Pain due to onychomycosis of toenails of both feet   2. Acquired hammertoes of both feet   3. Type 2 diabetes mellitus with diabetic autonomic neuropathy, with long-term current use of insulin ([place])     Plan: -Examined patient. -No new findings. No new orders. -Continue diabetic foot care principles. -Toenails 1-5 b/l were debrided in length and girth with sterile nail nippers and dremel without iatrogenic bleeding.  -Patient to report any pedal injuries to medical professional immediately. -Patient to continue soft, supportive shoe gear  daily. -Patient/POA to call should there be question/concern in the interim.  Return in about 3 months (around 10/02/2020) for diabetic nail trim.

## 2020-07-10 ENCOUNTER — Other Ambulatory Visit: Payer: Self-pay | Admitting: Family Medicine

## 2020-07-18 ENCOUNTER — Other Ambulatory Visit: Payer: Self-pay | Admitting: Family Medicine

## 2020-07-18 LAB — HM DIABETES EYE EXAM

## 2020-07-20 NOTE — Progress Notes (Signed)
Virtual Visit via Telephone Note   This visit type was conducted due to national recommendations for restrictions regarding the COVID-19 Pandemic (e.g. social distancing) in an effort to limit this patient's exposure and mitigate transmission in our community.  Due to her co-morbid illnesses, this patient is at least at moderate risk for complications without adequate follow up.  This format is felt to be most appropriate for this patient at this time.  The patient did not have access to video technology/had technical difficulties with video requiring transitioning to audio format only (telephone).  All issues noted in this document were discussed and addressed.  No physical exam could be performed with this format.  Please refer to the patient's chart for her  consent to telehealth for [facility].    Date:  07/21/2020   ID:  Rebecca Walter, DOB Mar 03, 1941, MRN [id number] The patient was identified using 2 identifiers.  Patient Location: Home Provider Location: Office/Clinic  PCP:  Rebecca Clan, DO  Cardiologist:  Rebecca Carnes, MD   Electrophysiologist:  None   Evaluation Performed:  Follow-Up Visit  Chief Complaint:  CHF, shortness of breath   Patient Profile: Rebecca Walter is a 79 y.o. female with:  Chronic Diastolic CHF ? Echocardiogram 01/2020: EF 70-75, Gr 1 DD  Valvular Heart Disease ? Mild AI, mild MR, mod PI  Pulmonary Hypertension  ? Echocardiogram 01/2020: RVSP 45.8  Hypertension  ? Hx of intol to carvedilol, clonidine  ? 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 Monitor: no sig arrhythmia; Carotid US: mild bilat plaque (no sig dz)  Myoview 9/18: Low Risk, small ant-apical ischemia vs shifting breast atten; asymptomatic >> Med  Rx  Myoview 7/21: No ischemia, low risk   Prior CV Studies: Myoview 06/26/20 Apical thinning, no ischemia or infarction, EF > 65; low risk   Echocardiogram 01/29/2020 EF 70-75, no RWMA, Gr 1 DD, mild RVE, normal RVSF, RVSP 45.8, mild to mod LAE, trivial MR, mild PI  LT Cardiac Monitor 12/2019 Sinus rhythm 28 to 160 bpm Average 59 bpm Occasional PVCs, PAC Rare burst NSVT Longest 8 beats  Carotid US 12/12/2019 Bilat ICA 1-39; L vertebral artery stenosis, R subclavian stenosis  Myoview 09/06/17 EF 67, small area of apicoseptal ischemia; low risk  Echocardiogram 07/16/17 Mild LVH, EF 60-65, Gr 1 DD, mild AI, mild MR, mild LAE, mod PI, PASP 35 Trivial eff   History of Present Illness:   Rebecca Walter was last seen 06/17/20 for evaluation of shortness of breath.  A CXR was unremarkable.  A BNP was normal.  A Myoview was low risk without ischemia.  She is seen for follow up.  We discussed the results of her tests.  She still feels short of breath.  She has been started on PPI for acid reflux.  She saw ENT for hoarseness.  This was felt to be due to GERD.  She has not had chest pain, syncope, leg swelling.  She sleeps on 3 pillows chronically.  Her weight hovers about 187-190 lbs.   She feels bloated at times.  She has noted she is not urinating as much as she thought she would.     Past Medical History:  Diagnosis Date  . Anxiety   .  Arthritis   . Chronic diastolic CHF (congestive heart failure) (HCC)    Echocardiogram 01/2020: EF 70-75, no RWMA, Gr 1 DD, normal RVSF, RVSP 45.8, mild to mod LAE, trivial MR, mild PI  . Diabetes mellitus   . GERD (gastroesophageal reflux disease)   . Gout   . History of nuclear stress test    Myoview 06/2020: EF 75, no ischemia or infarction, low risk  . Hyperlipidemia   . Hypertension   . Hypertensive heart disease with CHF (congestive heart failure) ([place])   . Mild aortic stenosis 06/2017  . Mild mitral regurgitation 06/2017  . Moderate  pulmonary valve insufficiency 06/2017  . Osteopenia    Past Surgical History:  Procedure Laterality Date  . KIDNEY SURGERY    . KNEE SURGERY    . MENISECTOMY    . WRIST SURGERY       Current Meds  Medication Sig  . acetaminophen (TYLENOL) 325 MG tablet Take 325 mg by mouth every 8 (eight) hours as needed (pain).   Marland Kitchen allopurinol (ZYLOPRIM) 100 MG tablet TAKE 2 TABLETS BY MOUTH EVERY DAY  . aluminum-magnesium hydroxide-simethicone (MAALOX) 144-315-40 MG/5ML SUSP Take 15 mLs by mouth 2 (two) times daily as needed.  Marland Kitchen amLODipine (NORVASC) 10 MG tablet TAKE 1 TABLET BY MOUTH EVERY DAY  . amLODipine (NORVASC) 2.5 MG tablet TAKE 1 TABLET 2 (TWO) TIMES DAILY AS NEEDED (FOR ELEVATED BLOOD PRESSURE SYSTOLIC >086 ONLY).  Marland Kitchen aspirin 81 MG chewable tablet Chew 1 tablet (81 mg total) by mouth daily.  . B-D ULTRAFINE III SHORT PEN 31G X 8 MM MISC USE TO INJECT INSULIN 4 TIMES DAILY OR AS DIRECTED.  Marland Kitchen baclofen (LIORESAL) 10 MG tablet Take 10 mg by mouth daily as needed for muscle spasms.  . Blood Glucose Monitoring Suppl (ACCU-CHEK AVIVA PLUS) w/Device KIT 1 Device by Does not apply route daily.  . busPIRone (BUSPAR) 15 MG tablet TAKE 1 TABLET (15 MG TOTAL) BY MOUTH 2 (TWO) TIMES DAILY.  Marland Kitchen Camphor-Eucalyptus-Menthol (VICKS VAPORUB EX) Place 1 application into both nostrils daily as needed (congestion).  . famotidine (PEPCID) 20 MG tablet Take 20 mg by mouth daily.  Marland Kitchen glucose blood (ACCU-CHEK AVIVA PLUS) test strip CHECK BLOOD SUGAR FIRST  THING IN THE MORNING BEFORE EATING, AFTER LUNCH, AND  AFTER DINNER  TOTAL 3 TIMES DAILY  . hydrocortisone 2.5 % cream Apply topically 2 (two) times daily.  . insulin aspart (NOVOLOG FLEXPEN) 100 UNIT/ML FlexPen Inject 9-18 Units into the skin See admin instructions. Inject 15 units subcutaneously daily with breakfast, inject 9 units with lunch as needed for CBG 180 or more, and inject 9 units with supper  . LANTUS SOLOSTAR 100 UNIT/ML Solostar Pen INJECT 65 UNITS INTO  THE SKIN DAILY.  Marland Kitchen lidocaine (XYLOCAINE) 2 % solution   . loratadine (CLARITIN) 10 MG tablet Take 10 mg by mouth daily.  Marland Kitchen lovastatin (ALTOPREV) 60 MG 24 hr tablet Take 1 tablet (60 mg total) by mouth at bedtime.  . metFORMIN (GLUCOPHAGE-XR) 500 MG 24 hr tablet Take 500 mg by mouth daily with breakfast.  . Multiple Vitamin (MULTIVITAMIN WITH MINERALS) TABS tablet Take 1 tablet by mouth daily.  . pantoprazole (PROTONIX) 40 MG tablet TAKE 1 TABLET BY MOUTH TWICE A DAY  . rOPINIRole (REQUIP) 0.25 MG tablet TAKE 1 TABLET (0.25 MG TOTAL) BY MOUTH AT BEDTIME.  Marland Kitchen sertraline (ZOLOFT) 100 MG tablet Take 1 tablet (100 mg total) by mouth daily.  . sertraline (ZOLOFT) 50 MG tablet Take 50  mg by mouth in the morning and at bedtime. One dose in the afternoon, one dose at bedtime  . spironolactone (ALDACTONE) 25 MG tablet Take 1 tablet (25 mg total) by mouth daily.  Marland Kitchen torsemide (DEMADEX) 10 MG tablet Take 1 tablet (10 mg total) by mouth daily as needed.  . traZODone (DESYREL) 50 MG tablet TAKE 1/2 1 TABLET BY MOUTH AT BEDTIME AS NEEDED FOR SLEEP  . valsartan (DIOVAN) 320 MG tablet Take 1 tablet (320 mg total) by mouth daily.  . [DISCONTINUED] baclofen (LIORESAL) 10 MG tablet TAKE 1 TABLET BY MOUTH TWICE A DAY (Patient taking differently: 10 mg as needed. )  . [DISCONTINUED] sertraline (ZOLOFT) 50 MG tablet Take 1 tablet (50 mg total) by mouth 2 (two) times daily. One dose in the afternoon and a second before bed.     Allergies:   Baclofen, Hctz [hydrochlorothiazide], Lotensin [benazepril], Victoza [liraglutide], Acyclovir and related, Beta adrenergic blockers, Losartan, and Other   Social History   Tobacco Use  . Smoking status: Never Smoker  . Smokeless tobacco: Never Used  Vaping Use  . Vaping Use: Never used  Substance Use Topics  . Alcohol use: No  . Drug use: No     Family Hx: The patient's family history includes Cancer in her brother and sister; Diabetes in her maternal uncle; Heart disease  in her brother and father; Hypertension in her father; Stroke in her father and mother.  ROS:   Please see the history of present illness.    Labs/Other Tests and Data Reviewed:    EKG:  No ECG reviewed.  Recent Labs: 01/11/2020: ALT 21; BNP 17.5; TSH 4.960 06/17/2020: BUN 26; Creatinine, Ser 1.18; Hemoglobin 13.0; NT-Pro BNP 180; Platelets 250; Potassium 5.0; Sodium 140   Recent Lipid Panel Lab Results  Component Value Date/Time   CHOL 197 05/20/2020 05:28 PM   TRIG 176 (H) 05/20/2020 05:28 PM   HDL 51 05/20/2020 05:28 PM   CHOLHDL 3.9 05/20/2020 05:28 PM   CHOLHDL 4.2 07/16/2017 04:02 AM   LDLCALC 115 (H) 05/20/2020 05:28 PM   LDLDIRECT 103 (H) 03/11/2010 09:27 PM    Wt Readings from Last 3 Encounters:  07/21/20 190 lb (86.2 kg)  06/26/20 188 lb (85.3 kg)  06/18/20 188 lb (85.3 kg)     Objective:    Vital Signs:  BP (!) 152/61   Pulse 64   Ht 5' (1.524 m)   Wt 190 lb (86.2 kg)   LMP  (LMP Unknown)   BMI 37.11 kg/m    VITAL SIGNS:  reviewed GEN:  no acute distress RESPIRATORY:  no labored breathing noted.  ASSESSMENT & PLAN:    1. Shortness of breath 2. Chronic diastolic CHF (congestive heart failure) ([place]) She continues to note shortness of breath.  I suspect her shortness of breath is multifactorial and related to deconditioning, acid reflux and CHF.  Her volume status has seemed stable.  Recent BNP was normal and CXR was normal.  Her Myoview was low risk.  I have suggested she take an extra Torsemide 20 mg on days she notes a 3lb weight gain or worsening swelling.  We also discussed the importance of limiting salt.  I have asked her to be as active as possible and to try to walk daily.  FU with Dr. Harrington Challenger in 3 mos.   Time:   Today, I have spent 17 minutes with the patient with telehealth technology discussing the above problems.     Medication Adjustments/Labs and  Tests Ordered: Current medicines are reviewed at length with the patient today.  Concerns  regarding medicines are outlined above.   Tests Ordered: No orders of the defined types were placed in this encounter.   Medication Changes: No orders of the defined types were placed in this encounter.   Follow Up:  In Person in 3 month(s)  Signed, Richardson Dopp, PA-C  07/21/2020 4:00 PM    [facility]

## 2020-07-21 ENCOUNTER — Telehealth: Payer: Self-pay | Admitting: Physician Assistant

## 2020-07-21 ENCOUNTER — Other Ambulatory Visit: Payer: Self-pay | Admitting: *Deleted

## 2020-07-21 ENCOUNTER — Encounter: Payer: Self-pay | Admitting: Physician Assistant

## 2020-07-21 ENCOUNTER — Other Ambulatory Visit: Payer: Self-pay

## 2020-07-21 ENCOUNTER — Telehealth (INDEPENDENT_AMBULATORY_CARE_PROVIDER_SITE_OTHER): Payer: Medicare Other | Admitting: Physician Assistant

## 2020-07-21 VITALS — BP 152/61 | HR 64 | Ht 60.0 in | Wt 190.0 lb

## 2020-07-21 DIAGNOSIS — R0602 Shortness of breath: Secondary | ICD-10-CM

## 2020-07-21 DIAGNOSIS — I5032 Chronic diastolic (congestive) heart failure: Secondary | ICD-10-CM | POA: Diagnosis not present

## 2020-07-21 NOTE — Patient Instructions (Addendum)
Medication Instructions:  Your physician recommends that you continue on your current medications as directed. Please refer to the Current Medication list given to you today.  *If you need a refill on your cardiac medications before your next appointment, please call your pharmacy*  Lab Work: None ordered today  Testing/Procedures: None ordered today  Follow-Up:  On 10/30/20 at 1:20PM with Dorris Carnes, MD

## 2020-07-21 NOTE — Telephone Encounter (Signed)
New Message  Patient calling in to inform Rebecca Walter that he my chart is working now and she can see the video for the video visit. Called Pod I, no answer. Please give patient a call back to assist.

## 2020-07-21 NOTE — Telephone Encounter (Signed)
Appointment changed to telephone call

## 2020-07-22 ENCOUNTER — Telehealth: Payer: Self-pay | Admitting: Family Medicine

## 2020-07-22 MED ORDER — BUSPIRONE HCL 15 MG PO TABS: 15.0000 mg | ORAL_TABLET | Freq: Two times a day (BID) | ORAL | 11 refills | Status: DC

## 2020-07-22 NOTE — Telephone Encounter (Signed)
Patient is calling and would like for Dr. Higinio Plan to call her to discuss some issues she is having with her Potassium. She was dehydrated over the weekend and trying to get better. I offered patient an appointment but she states she just cant right now. She will schedule in the future.   The best call back is [phone]

## 2020-07-23 ENCOUNTER — Other Ambulatory Visit: Payer: Self-pay | Admitting: Family Medicine

## 2020-07-25 ENCOUNTER — Telehealth: Payer: Self-pay | Admitting: Family Medicine

## 2020-07-25 NOTE — Telephone Encounter (Signed)
Called patient to discuss potassium. She just wanted to make sure that her potassium would still be fine after recent episode of diarrhea earlier this week due to eating too many cough drops (enjoys the taste). Resolved now, keeping herself hydrated.   Recent K 5.0 in 6/29, always seems to run on the high normal side. Discussed potassium likely still normal encouraged to maintain hydration.  She additionally received many check-in calls through her previous primary provider, Dr. Shan Levans.  She was hoping to see if we could set up a similar schedule.  Will try to call and touch base every couple of weeks.  [name], DO

## 2020-08-13 ENCOUNTER — Other Ambulatory Visit: Payer: Self-pay | Admitting: Family Medicine

## 2020-08-14 ENCOUNTER — Telehealth: Payer: Self-pay | Admitting: *Deleted

## 2020-08-14 ENCOUNTER — Other Ambulatory Visit (HOSPITAL_COMMUNITY): Payer: Self-pay | Admitting: Family Medicine

## 2020-08-14 DIAGNOSIS — F419 Anxiety disorder, unspecified: Secondary | ICD-10-CM

## 2020-08-14 MED ORDER — SERTRALINE HCL 100 MG PO TABS: 100.0000 mg | ORAL_TABLET | Freq: Every day | ORAL | 11 refills | Status: DC

## 2020-08-14 MED ORDER — SERTRALINE HCL 50 MG PO TABS: 50.0000 mg | ORAL_TABLET | Freq: Two times a day (BID) | ORAL | 3 refills | Status: DC

## 2020-08-14 NOTE — Telephone Encounter (Signed)
Sent in refill.  [name], DO

## 2020-08-14 NOTE — Telephone Encounter (Signed)
Patient calls to straighten out her zoloft.  She takes 100mg  in the am, 50 at lunch and 50 at night.  She is out of 100mg  tablets and will need a refill.  She is confused because the pharmacy is telling her that she has not had a 100mg  script since May.  Christen Bame, CMA

## 2020-08-19 ENCOUNTER — Telehealth: Payer: Self-pay | Admitting: Family Medicine

## 2020-08-19 NOTE — Telephone Encounter (Signed)
I have openings in AT[facility] on Thursday, 9/2, afternoon. Can we see if she would be interested in doing a virtual appointment?   [name], DO

## 2020-08-19 NOTE — Telephone Encounter (Signed)
Patient requesting Dr. Higinio Plan call her and discuss her sleeping issues. She says she's having trouble sleeping, even with the sleep medication. Offered an in person visit since she hadn't met Dr. Higinio Plan yet, but patient declined.

## 2020-08-20 NOTE — Telephone Encounter (Signed)
Virtual appointment made for 09/01/2020 at 1530.  Patient does not want to come in office and prefers phone only.  Ozella Almond, [place]

## 2020-08-24 ENCOUNTER — Other Ambulatory Visit: Payer: Self-pay | Admitting: Family Medicine

## 2020-08-28 ENCOUNTER — Telehealth: Payer: Self-pay

## 2020-08-28 NOTE — Telephone Encounter (Signed)
Great news! Hopefully she is feeling better as well, if she is feeling significantly weak with any chest pain or difficulty breathing she does need to be evaluated prior to Monday.   [name], DO

## 2020-08-28 NOTE — Telephone Encounter (Signed)
Patient LVM on nurse line stating did an at home test and negative. Patient will call us if she needs [place], otherwise she is happy with result.

## 2020-08-28 NOTE — Telephone Encounter (Signed)
Patient calls nurse line regarding receiving COVID testing. Patient reports loss of taste and weakness. Denies cough, fever, SHOB or chest pain.  Patient is going to receive a COVID test from [facility] and will return call to office once she has results.   FYI to PCP  Talbot Grumbling, RN

## 2020-09-01 ENCOUNTER — Other Ambulatory Visit: Payer: Self-pay | Admitting: Gastroenterology

## 2020-09-01 ENCOUNTER — Telehealth (INDEPENDENT_AMBULATORY_CARE_PROVIDER_SITE_OTHER): Payer: Medicare Other | Admitting: Family Medicine

## 2020-09-01 DIAGNOSIS — G4700 Insomnia, unspecified: Secondary | ICD-10-CM

## 2020-09-01 DIAGNOSIS — M170 Bilateral primary osteoarthritis of knee: Secondary | ICD-10-CM | POA: Diagnosis not present

## 2020-09-01 DIAGNOSIS — F419 Anxiety disorder, unspecified: Secondary | ICD-10-CM | POA: Diagnosis not present

## 2020-09-01 DIAGNOSIS — R140 Abdominal distension (gaseous): Secondary | ICD-10-CM

## 2020-09-01 DIAGNOSIS — R6881 Early satiety: Secondary | ICD-10-CM

## 2020-09-01 DIAGNOSIS — I10 Essential (primary) hypertension: Secondary | ICD-10-CM

## 2020-09-01 DIAGNOSIS — E1143 Type 2 diabetes mellitus with diabetic autonomic (poly)neuropathy: Secondary | ICD-10-CM

## 2020-09-01 DIAGNOSIS — Z794 Long term (current) use of insulin: Secondary | ICD-10-CM

## 2020-09-01 MED ORDER — DICLOFENAC SODIUM 1 % EX GEL: 4.0000 g | Freq: Four times a day (QID) | CUTANEOUS | 3 refills | Status: DC | PRN

## 2020-09-01 MED ORDER — AMLODIPINE BESYLATE 10 MG PO TABS: 10.0000 mg | ORAL_TABLET | Freq: Every day | ORAL | 3 refills | Status: DC

## 2020-09-01 MED ORDER — RAMELTEON 8 MG PO TABS: 8.0000 mg | ORAL_TABLET | Freq: Every day | ORAL | 0 refills | Status: DC

## 2020-09-01 MED ORDER — DICLOFENAC SODIUM 1 % EX GEL
4.0000 g | Freq: Four times a day (QID) | CUTANEOUS | Status: DC
Start: 2020-09-01 — End: 2020-09-01

## 2020-09-01 NOTE — Progress Notes (Signed)
[place] Telemedicine Visit  Patient consented to have virtual visit and was identified by name and date of birth. Method of visit: Telephone  Encounter participants: Patient: Rebecca Walter - located at home Provider: [name] - located at [facility] Others (if applicable): None   Chief Complaint: Check in   HPI: Ms. Kulak is an 79 year old female presenting via phone for follow-up.  She states she is overall doing okay and feeling better than a few days ago when she had a suspected viral URI.  Says that her knees have been acting up with weather changes, no weakness, locking, or numbness.  She reports she recently saw her GI physician who ordered a CT abdomen to further evaluate for her constant abdominal bloating and early satiety.  She notes over time she has been eating less but continues to feel distended.  Her weight has been consistent, most recently 189 pounds at home.  She also reports continued insomnia.  She has difficulty falling asleep and staying asleep.  Previously on Xanax to calm her nerves prior to bedtime, however was weaned off of this.  Since then she has tried trazodone, gabapentin, and melatonin without success.  She has been using a muscle relaxer to help calm her prior to bedtime.  She is following with cardiology for her blood pressure.  Currently taking Norvasc 10 mg, Aldactone, valsartan, Demadex and a 2.5 mg Norvasc tablet as needed when her systolic is over 268.   ROS: per HPI  Pertinent PMHx: CHF, hypertension, OSA, GERD, severe anxiety, type 2 diabetes, history of hyponatremia, restless leg syndrome  Exam:  LMP  (LMP Unknown)   Respiratory: Normal work of breathing, speaking in full sentences  Assessment/Plan:  INSOMNIA Chronic, uncontrolled with several regimen failure.  In effort to avoid benzodiazepines, will try ramelteon at bedtime.  Discussed trialing half tablet first given her age, counseled on common side effects.   Already following appropriate sleep hygiene otherwise.  Anxiety Reasonably well-controlled currently.  Continue regimen as is with Zoloft and BuSpar.  Early satiety Further evaluation per GI, will follow up CT abdomen scheduled for 9/24.  Diabetes mellitus, type II ([place]) Due for A1c, well-controlled with A1c of 6.5 in 05/2020.  Stable on current regimen of Lantus, SSI, and Metformin without any hypoglycemia.  Will keep regimen as is per patient preference instability.  Osteoarthritis of both knees Rx'd Voltaren gel to try alternating with her topical lidocaine and heat/ice PRN.     Follow-up in 1 month in person for A1c check, sooner if needed.  Time spent during visit with patient: 30 minutes  [name], DO

## 2020-09-04 ENCOUNTER — Encounter: Payer: Self-pay | Admitting: Family Medicine

## 2020-09-04 DIAGNOSIS — R6881 Early satiety: Secondary | ICD-10-CM | POA: Insufficient documentation

## 2020-09-04 HISTORY — DX: Early satiety: R68.81

## 2020-09-04 NOTE — Assessment & Plan Note (Signed)
Chronic, uncontrolled with several regimen failure.  In effort to avoid benzodiazepines, will try ramelteon at bedtime.  Discussed trialing half tablet first given her age, counseled on common side effects.  Already following appropriate sleep hygiene otherwise.

## 2020-09-04 NOTE — Assessment & Plan Note (Signed)
Further evaluation per GI, will follow up CT abdomen scheduled for 9/24.

## 2020-09-04 NOTE — Assessment & Plan Note (Signed)
Due for A1c, well-controlled with A1c of 6.5 in 05/2020.  Stable on current regimen of Lantus, SSI, and Metformin without any hypoglycemia.  Will keep regimen as is per patient preference instability.

## 2020-09-04 NOTE — Assessment & Plan Note (Signed)
Reasonably well-controlled currently.  Continue regimen as is with Zoloft and BuSpar.

## 2020-09-04 NOTE — Assessment & Plan Note (Signed)
Rx'd Voltaren gel to try alternating with her topical lidocaine and heat/ice PRN.

## 2020-09-05 ENCOUNTER — Other Ambulatory Visit: Payer: Self-pay | Admitting: Family Medicine

## 2020-09-09 ENCOUNTER — Other Ambulatory Visit: Payer: Self-pay

## 2020-09-09 MED ORDER — LOVASTATIN ER 60 MG PO TB24: 60.0000 mg | ORAL_TABLET | Freq: Every day | ORAL | 3 refills | Status: DC

## 2020-09-12 ENCOUNTER — Other Ambulatory Visit: Payer: Self-pay

## 2020-09-12 ENCOUNTER — Ambulatory Visit
Admission: RE | Admit: 2020-09-12 | Discharge: 2020-09-12 | Disposition: A | Discharge status: Home / Self Care | Payer: Medicare Other | Source: Ambulatory Visit | Attending: Gastroenterology | Admitting: Gastroenterology

## 2020-09-12 DIAGNOSIS — R140 Abdominal distension (gaseous): Secondary | ICD-10-CM

## 2020-09-12 MED ORDER — IOPAMIDOL (ISOVUE-300) INJECTION 61%
100.0000 mL | Freq: Once | INTRAVENOUS | Status: AC | PRN
  Administered 2020-09-12: 80 mL via INTRAVENOUS

## 2020-09-18 ENCOUNTER — Other Ambulatory Visit: Payer: Self-pay

## 2020-09-18 ENCOUNTER — Encounter: Payer: Self-pay | Admitting: Family Medicine

## 2020-09-18 ENCOUNTER — Telehealth (INDEPENDENT_AMBULATORY_CARE_PROVIDER_SITE_OTHER): Payer: Medicare Other | Admitting: Family Medicine

## 2020-09-18 VITALS — BP 152/64 | HR 61 | Ht 60.0 in | Wt 191.0 lb

## 2020-09-18 DIAGNOSIS — M170 Bilateral primary osteoarthritis of knee: Secondary | ICD-10-CM

## 2020-09-18 DIAGNOSIS — E1143 Type 2 diabetes mellitus with diabetic autonomic (poly)neuropathy: Secondary | ICD-10-CM | POA: Diagnosis not present

## 2020-09-18 DIAGNOSIS — G4700 Insomnia, unspecified: Secondary | ICD-10-CM | POA: Diagnosis not present

## 2020-09-18 DIAGNOSIS — Z794 Long term (current) use of insulin: Secondary | ICD-10-CM

## 2020-09-18 DIAGNOSIS — I110 Hypertensive heart disease with heart failure: Secondary | ICD-10-CM | POA: Diagnosis not present

## 2020-09-18 DIAGNOSIS — I5032 Chronic diastolic (congestive) heart failure: Secondary | ICD-10-CM

## 2020-09-18 DIAGNOSIS — I340 Nonrheumatic mitral (valve) insufficiency: Secondary | ICD-10-CM

## 2020-09-18 MED ORDER — DEMADEX 10 MG PO TABS: 10.0000 mg | ORAL_TABLET | Freq: Every day | ORAL | 2 refills | Status: DC

## 2020-09-18 NOTE — Progress Notes (Signed)
[place] Telemedicine Visit  Patient consented to have virtual visit and was identified by name and date of birth. Method of visit: Telephone  Encounter participants: Patient: Rebecca Walter - located at home, [place] Provider: [name] - located at [facility] Others (if applicable): None   Chief Complaint: Follow up   HPI: Rebecca Walter is an 79 year old female presenting via telephone discussed the following:  Insomnia: Discussed this during her last visit with several regimen failure in the past.  Rx ramelteon during last virtual visit.  She was unable to break it in half, took a full tablet and felt very drowsy for the next day.  She has not tried it since.  Does not have a pill cutter.  She is very worried about having something calm her down prior to bedtime.  She is interested in trying a muscle relaxer instead.  Osteoarthritis: Significantly better with the Voltaren gel on her knees and back.  Will continue using this.  Chronic abdominal fullness: Followed by GI.  Recently got a CT abdomen/pelvis which showed no evidence of cause of abdominal pain/bloatedness.  Did show a uterine fibroid, however she believes this has been here for quite some time.  No vaginal bleeding or pelvic discomfort.  ROS: per HPI  Pertinent PMHx: CHF, hypertension, OSA, GERD, severe anxiety, type 2 diabetes, history of hyponatremia, restless leg syndrome  Exam:  BP (!) 152/64   Pulse 61   Ht 5' (1.524 m)   Wt 191 lb (86.6 kg)   LMP  (LMP Unknown)   BMI 37.30 kg/m   Respiratory: speaking in full sentences   Assessment/Plan:  Hypertensive heart disease with CHF (congestive heart failure) (HCC) Takes torsemide 10 mg as needed for fluid retention at home.  She is requesting the brand name as generic has not seem to have any effect for her.  Will Rx Demadex and assess if improvement.  INSOMNIA Chronic, poorly controlled.  Did not tolerate full ramelteon dose, she will be  obtaining a pill cutter and trialing half tablet prior to bedtime.  Encouraged appropriate sleep hygiene and discussed a warm bath/shower with lavender and music prior to bed for relaxation.  Diabetes mellitus, type II ([place]) Well-controlled.  Will obtain updated A1c on office visit scheduled for 10/21.  Osteoarthritis of both knees Continue Voltaren gel, topical lidocaine, ice/heat as needed.    Time spent during visit with patient: 26 minutes  Office appointment scheduled for 10/21 during visit, follow-up sooner if needed.  [name], DO

## 2020-09-18 NOTE — Assessment & Plan Note (Signed)
Well-controlled.  Will obtain updated A1c on office visit scheduled for 10/21.

## 2020-09-18 NOTE — Assessment & Plan Note (Signed)
Continue Voltaren gel, topical lidocaine, ice/heat as needed.

## 2020-09-18 NOTE — Assessment & Plan Note (Signed)
Takes torsemide 10 mg as needed for fluid retention at home.  She is requesting the brand name as generic has not seem to have any effect for her.  Will Rx Demadex and assess if improvement.

## 2020-09-18 NOTE — Assessment & Plan Note (Signed)
Chronic, poorly controlled.  Did not tolerate full ramelteon dose, she will be obtaining a pill cutter and trialing half tablet prior to bedtime.  Encouraged appropriate sleep hygiene and discussed a warm bath/shower with lavender and music prior to bed for relaxation.

## 2020-09-21 ENCOUNTER — Other Ambulatory Visit: Payer: Self-pay | Admitting: Family Medicine

## 2020-09-24 ENCOUNTER — Other Ambulatory Visit: Payer: Self-pay | Admitting: Family Medicine

## 2020-09-24 DIAGNOSIS — G4700 Insomnia, unspecified: Secondary | ICD-10-CM

## 2020-09-24 NOTE — Telephone Encounter (Signed)
Patient calls nurse line regarding medications. Informed patient that refill for lovastatin was sent on 9/21 for a 3 month supply with three refills. Patient will contact pharmacy for refill.   Patient also states the Demadex was supposed to be written for two tablets daily. Currently rx is written for patient to take 1 tablet daily.   Please advise how patient should be taking Demadex. If patient is to be taking 2 pills daily, please update rx.   To PCP  Talbot Grumbling, RN

## 2020-09-26 ENCOUNTER — Other Ambulatory Visit: Payer: Self-pay | Admitting: Family Medicine

## 2020-09-26 DIAGNOSIS — I340 Nonrheumatic mitral (valve) insufficiency: Secondary | ICD-10-CM

## 2020-09-26 MED ORDER — DEMADEX 10 MG PO TABS: 20.0000 mg | ORAL_TABLET | Freq: Every day | ORAL | 2 refills | Status: DC | PRN

## 2020-09-26 NOTE — Telephone Encounter (Signed)
Sent in prescription to say 20 mg as needed via review of most recent cardiology note in 07/2020.  [name], DO

## 2020-09-30 ENCOUNTER — Other Ambulatory Visit: Payer: Self-pay

## 2020-10-01 MED ORDER — PANTOPRAZOLE SODIUM 40 MG PO TBEC: 40.0000 mg | DELAYED_RELEASE_TABLET | Freq: Two times a day (BID) | ORAL | 1 refills | Status: DC

## 2020-10-02 ENCOUNTER — Other Ambulatory Visit: Payer: Self-pay | Admitting: *Deleted

## 2020-10-02 DIAGNOSIS — G4700 Insomnia, unspecified: Secondary | ICD-10-CM

## 2020-10-02 NOTE — Telephone Encounter (Signed)
Rx request for ropinirole hcl 0.25mg  tablet. Not on med list please advise. Rebecca Walter, CMA

## 2020-10-04 ENCOUNTER — Other Ambulatory Visit: Payer: Self-pay | Admitting: Family Medicine

## 2020-10-06 ENCOUNTER — Telehealth: Payer: Self-pay | Admitting: *Deleted

## 2020-10-06 NOTE — Telephone Encounter (Signed)
Rx request for gabapentin 100mg  caps. Please advise. Mischa Pollard Kennon Holter, CMA

## 2020-10-08 ENCOUNTER — Other Ambulatory Visit: Payer: Self-pay | Admitting: Family Medicine

## 2020-10-08 ENCOUNTER — Encounter: Payer: Self-pay | Admitting: Podiatry

## 2020-10-08 ENCOUNTER — Ambulatory Visit: Payer: Medicare Other | Admitting: Podiatry

## 2020-10-08 ENCOUNTER — Other Ambulatory Visit: Payer: Self-pay

## 2020-10-08 DIAGNOSIS — M2041 Other hammer toe(s) (acquired), right foot: Secondary | ICD-10-CM

## 2020-10-08 DIAGNOSIS — M2042 Other hammer toe(s) (acquired), left foot: Secondary | ICD-10-CM

## 2020-10-08 DIAGNOSIS — M79674 Pain in right toe(s): Secondary | ICD-10-CM

## 2020-10-08 DIAGNOSIS — F419 Anxiety disorder, unspecified: Secondary | ICD-10-CM

## 2020-10-08 DIAGNOSIS — E1143 Type 2 diabetes mellitus with diabetic autonomic (poly)neuropathy: Secondary | ICD-10-CM

## 2020-10-08 DIAGNOSIS — M79675 Pain in left toe(s): Secondary | ICD-10-CM

## 2020-10-08 DIAGNOSIS — I10 Essential (primary) hypertension: Secondary | ICD-10-CM

## 2020-10-08 DIAGNOSIS — Z794 Long term (current) use of insulin: Secondary | ICD-10-CM

## 2020-10-08 DIAGNOSIS — B351 Tinea unguium: Secondary | ICD-10-CM

## 2020-10-08 MED ORDER — LOVASTATIN ER 60 MG PO TB24: 60.0000 mg | ORAL_TABLET | Freq: Every day | ORAL | 3 refills | Status: DC

## 2020-10-08 NOTE — Telephone Encounter (Signed)
Called patient to discuss request for gabapentin and ropinirole as it appeared she was no longer taking these via chart review. She additionally reports that she does not take these anymore and that [facility] sent these request automatically. Will not send rx.   [name], DO

## 2020-10-09 ENCOUNTER — Ambulatory Visit: Payer: Medicare Other | Admitting: Family Medicine

## 2020-10-09 VITALS — BP 132/80 | HR 80 | Ht 61.0 in | Wt 197.8 lb

## 2020-10-09 DIAGNOSIS — Z794 Long term (current) use of insulin: Secondary | ICD-10-CM | POA: Diagnosis not present

## 2020-10-09 DIAGNOSIS — I10 Essential (primary) hypertension: Secondary | ICD-10-CM

## 2020-10-09 DIAGNOSIS — G4700 Insomnia, unspecified: Secondary | ICD-10-CM | POA: Diagnosis not present

## 2020-10-09 DIAGNOSIS — E1143 Type 2 diabetes mellitus with diabetic autonomic (poly)neuropathy: Secondary | ICD-10-CM

## 2020-10-09 DIAGNOSIS — I340 Nonrheumatic mitral (valve) insufficiency: Secondary | ICD-10-CM

## 2020-10-09 DIAGNOSIS — Z0000 Encounter for general adult medical examination without abnormal findings: Secondary | ICD-10-CM

## 2020-10-09 DIAGNOSIS — I5032 Chronic diastolic (congestive) heart failure: Secondary | ICD-10-CM | POA: Diagnosis not present

## 2020-10-09 DIAGNOSIS — Z23 Encounter for immunization: Secondary | ICD-10-CM | POA: Diagnosis not present

## 2020-10-09 LAB — POCT GLYCOSYLATED HEMOGLOBIN (HGB A1C): Hemoglobin A1C: 6.3 % — AB (ref 4.0–5.6)

## 2020-10-09 NOTE — Progress Notes (Signed)
SUBJECTIVE:   CHIEF COMPLAINT / HPI: Follow-up  Rebecca Walter is an 79 year old female presenting discussed the following:  Insomnia: Discussed sleep hygiene and half tablet of ramelteon during last visit.  She previously has tried melatonin, trazodone, gabapentin without success.  She has been using baclofen to help her sleep as well.  Says the half tablet of ramelteon has been helping.  Type 2 diabetes: Last A1c 6.5.  She has been using NovoLog 18 units after breakfast, Lantus 65 units, and Metformin 500 mg.  Her previous PCP had had discussions on decreasing regimen since her A1c has been well controlled for quite some time, however she feels comfortable with this regimen.  She reports recently having a CBG of 70 and 50 several weeks ago a few hours after taking her breakfast NovoLog.  She would feel fatigued during that time but denied any lightheadedness/dizziness with it.  Weight gain: Feels like she has gained several pounds in the last week, feels edema around her abdomen.  Reports that the torsemide 20 mg no longer gives her any diuresis.  She has been drinking more water to urinate more frequently.  She has not made any changes to her diet or physical activity.  Denies any new onset orthopnea, shortness of breath, chest pain.  She has felt chronically short of breath and has not progressed recently.  Had a normal Lexiscan Myoview 06/2020.  She has a known history of moderate pulmonary valve insufficiency, mild mitral regurg, and diastolic dysfunction.  Follows with cardiology.  PERTINENT  PMH / PSH: CHF, hypertension, OSA, GERD,severe anxiety, type 2 diabetes, history of hyponatremia, restless leg syndrome  OBJECTIVE:   BP 132/80    Pulse 80    Ht 5\' 1"  (1.549 m)    Wt 197 lb 12.8 oz (89.7 kg)    LMP  (LMP Unknown)    SpO2 98%    BMI 37.37 kg/m   General: Alert, NAD HEENT: NCAT, MMM Cardiac: RRR, 2/6 holosystolic murmur best heard on the left sternal border Lungs: Clear  bilaterally, no increased WOB on room air without any crackles heard Abdomen: soft, non-tender without any pitting on lateral aspect of abdomen bilaterally Msk: Moves all extremities spontaneously, uses cane for gait Ext: Warm, dry, 2+ distal pulses, +1 pitting edema to midshin bilaterally  ASSESSMENT/PLAN:   Diabetes mellitus, type II (HCC) A1c 6.3, goal around 7-8.  While she has felt very comfortable with her insulin regimen, given few episodes of hypoglycemia after NovoLog administration adamantly discussed decreasing from 18 to 10U following breakfast.  Additionally discussed that we could likely go down on her Lantus as well, however she would like to keep as it is which is reasonable for now.  Continue monitoring CBGs, if any hypoglycemia s/sx or CBG <70, will decrease novolog +/- lantus further.   Chronic diastolic CHF (congestive heart failure) (HCC) Up 6 pounds since 9/30 and through her home measurements with worsening edema, reassuringly breathing comfortably.  Endorses minimal diuresis with torsemide 20 mg, will increase to 40 mg daily over the weekend (next 4 days) to assess for improvement.  Sent in Demadex refill. Check BMP today and additionally next week given diuretic changes.  Already has follow-up with her cardiologist in early November.  Will additionally encourage compression stockings and conservative management for edema only.  INSOMNIA Doing better with low-dose ramelteon.  Will monitor.  Healthcare maintenance Given Covid booster vaccine today. Monitored for 15 minutes without concern.  Counseled on common side effects including  but not limited to arm soreness/malaise/headaches.  Plans on getting her flu shot next week.    Follow-up next week for repeat labs, then in 1 month with myself or sooner if needed.  Rebecca Walter, Rebecca Walter

## 2020-10-09 NOTE — Patient Instructions (Addendum)
Change to novolog (short acting insulin) to 10 Units after breakfast.   You can try to increase your torsemide to 40 mg to see if you get some urine output from this and can help with swelling.  Please measure your weight every day.  If so, then use the 40 mg for the next 3 days.  Afterwards then use 40 mg on an as-needed basis if you have over 3 pound weight gain in 1 day or over 5 pound weight gain in 3 days.  Follow-up in 1 week for repeat labs.

## 2020-10-10 ENCOUNTER — Telehealth: Payer: Self-pay

## 2020-10-10 ENCOUNTER — Other Ambulatory Visit: Payer: Self-pay

## 2020-10-10 DIAGNOSIS — I10 Essential (primary) hypertension: Secondary | ICD-10-CM

## 2020-10-10 LAB — BASIC METABOLIC PANEL
BUN/Creatinine Ratio: 26 (ref 12–28)
BUN: 27 mg/dL (ref 8–27)
CO2: 22 mmol/L (ref 20–29)
Calcium: 9.1 mg/dL (ref 8.7–10.3)
Chloride: 100 mmol/L (ref 96–106)
Creatinine, Ser: 1.03 mg/dL — ABNORMAL HIGH (ref 0.57–1.00)
GFR calc Af Amer: 59 mL/min/{1.73_m2} — ABNORMAL LOW (ref >59)
GFR calc non Af Amer: 51 mL/min/{1.73_m2} — ABNORMAL LOW (ref >59)
Glucose: 112 mg/dL — ABNORMAL HIGH (ref 65–99)
Potassium: 4.3 mmol/L (ref 3.5–5.2)
Sodium: 138 mmol/L (ref 134–144)

## 2020-10-10 MED ORDER — DEMADEX 10 MG PO TABS: 20.0000 mg | ORAL_TABLET | Freq: Every day | ORAL | 2 refills | Status: DC | PRN

## 2020-10-10 MED ORDER — LOVASTATIN ER 60 MG PO TB24: 60.0000 mg | ORAL_TABLET | Freq: Every day | ORAL | 3 refills | Status: DC

## 2020-10-10 MED ORDER — SPIRONOLACTONE 25 MG PO TABS: 25.0000 mg | ORAL_TABLET | Freq: Every day | ORAL | 3 refills | Status: DC

## 2020-10-10 NOTE — Telephone Encounter (Signed)
Patient calls nurse line regarding prescription refills. Patient states that pharmacy never received lovastatin or torsemide.  Called pharmacy and verified that they never received either rx.   Pharmacist states that they have an old rx for the torsemide, however, per OV note, patient was supposed to increase to 40 mg.   [facility] pres[facility]ptions [facility] [address].   Talbot Grumbling, RN

## 2020-10-11 ENCOUNTER — Encounter: Payer: Self-pay | Admitting: Family Medicine

## 2020-10-11 DIAGNOSIS — Z0000 Encounter for general adult medical examination without abnormal findings: Secondary | ICD-10-CM

## 2020-10-11 HISTORY — DX: Encounter for general adult medical examination without abnormal findings: Z00.00

## 2020-10-11 NOTE — Assessment & Plan Note (Signed)
A1c 6.3, goal around 7-8.  While she has felt very comfortable with her insulin regimen, given few episodes of hypoglycemia after NovoLog administration adamantly discussed decreasing from 18 to 10U following breakfast.  Additionally discussed that we could likely go down on her Lantus as well, however she would like to keep as it is which is reasonable for now.  Continue monitoring CBGs, if any hypoglycemia s/sx or CBG <70, will decrease novolog +/- lantus further.

## 2020-10-11 NOTE — Assessment & Plan Note (Signed)
Given Covid booster vaccine today. Monitored for 15 minutes without concern.  Counseled on common side effects including but not limited to arm soreness/malaise/headaches.  Plans on getting her flu shot next week.

## 2020-10-11 NOTE — Telephone Encounter (Signed)
Sent in refills of both medications. Will not change the dosing of the demadex as the 40mg  is temporary.   [name], DO

## 2020-10-11 NOTE — Assessment & Plan Note (Signed)
Up 6 pounds since 9/30 and through her home measurements with worsening edema, reassuringly breathing comfortably.  Endorses minimal diuresis with torsemide 20 mg, will increase to 40 mg daily over the weekend (next 4 days) to assess for improvement.  Sent in Demadex refill. Check BMP today and additionally next week given diuretic changes.  Already has follow-up with her cardiologist in early November.  Will additionally encourage compression stockings and conservative management for edema only.

## 2020-10-11 NOTE — Assessment & Plan Note (Signed)
Doing better with low-dose ramelteon.  Will monitor.

## 2020-10-12 NOTE — Progress Notes (Signed)
Subjective: Rebecca Walter presents today for preventative diabetic foot care and painful thick toenails that are difficult to trim. Pain interferes with ambulation. Aggravating factors include wearing enclosed shoe gear. Pain is relieved with periodic professional debridement.   She voices no new pedal concerns on today's visit.  Past Medical History:  Diagnosis Date  . Anxiety   . Arthritis   . Candidiasis of skin 02/14/2020  . Chronic diastolic CHF (congestive heart failure) (HCC)    Echocardiogram 01/2020: EF 70-75, no RWMA, Gr 1 DD, normal RVSF, RVSP 45.8, mild to mod LAE, trivial MR, mild PI  . Diabetes mellitus   . Epistaxis 02/04/2020  . GERD (gastroesophageal reflux disease)   . Gout   . History of nuclear stress test    Myoview 06/2020: EF 75, no ischemia or infarction, low risk  . Hyperlipidemia   . Hypertension   . Hypertensive heart disease with CHF (congestive heart failure) ([place])   . Mild aortic stenosis 06/2017  . Mild mitral regurgitation 06/2017  . Moderate pulmonary valve insufficiency 06/2017  . Osteopenia     Patient Active Problem List   Diagnosis Date Noted  . Healthcare maintenance 10/11/2020  . Early satiety 09/04/2020  . Laryngopharyngeal reflux (LPR) 06/19/2020  . Frequent falls 04/17/2020  . Restless leg syndrome 01/23/2020  . Obstructive sleep apnea 12/04/2019  . Hoarseness 11/27/2019  . Hyponatremia   . Diabetic autonomic neuropathy ([place]) 01/24/2019  . Generalized weakness 12/15/2018  . Chronically dry eyes, right 12/15/2018  . Hypertension 10/17/2018  . Elevated TSH 11/21/2017  . Chronic diastolic CHF (congestive heart failure) ([place]) 07/15/2017  . Mild aortic stenosis 06/19/2017  . Mild mitral regurgitation 06/19/2017  . Moderate pulmonary valve insufficiency 06/19/2017  . Lumbar back pain with radiculopathy affecting right lower extremity 10/29/2016  . Vertigo 08/29/2013  . Allergic rhinitis 03/22/2011  . INSOMNIA 09/16/2009  .  GASTROPARESIS 08/12/2009  . Anxiety 10/18/2008  . DYSKINESIA, ESOPHAGUS 09/13/2007  . CARCINOMA, BASAL CELL 05/26/2007  . Diabetes mellitus, type II ([place]) 02/16/2007  . HLD (hyperlipidemia) 02/16/2007  . OBESITY, NOS 02/16/2007  . GASTROESOPHAGEAL REFLUX, NO ESOPHAGITIS 02/16/2007  . Osteoarthritis of both knees 02/16/2007  . Osteopenia after menopause 02/16/2007  . Gout 02/16/2007    Past Surgical History:  Procedure Laterality Date  . KIDNEY SURGERY    . KNEE SURGERY    . MENISECTOMY    . WRIST SURGERY      Current Outpatient Medications on File Prior to Visit  Medication Sig Dispense Refill  . acetaminophen (TYLENOL) 325 MG tablet Take 325 mg by mouth every 8 (eight) hours as needed (pain).     Marland Kitchen allopurinol (ZYLOPRIM) 100 MG tablet TAKE 2 TABLETS BY MOUTH EVERY DAY 180 tablet 2  . aluminum-magnesium hydroxide-simethicone (MAALOX) 254-270-62 MG/5ML SUSP Take 15 mLs by mouth 2 (two) times daily as needed. 500 mL 0  . amLODipine (NORVASC) 10 MG tablet Take 1 tablet (10 mg total) by mouth daily. 90 tablet 3  . amLODipine (NORVASC) 2.5 MG tablet TAKE 1 TABLET 2 (TWO) TIMES DAILY AS NEEDED (FOR ELEVATED BLOOD PRESSURE SYSTOLIC >376 ONLY). 60 tablet 3  . aspirin 81 MG chewable tablet Chew 1 tablet (81 mg total) by mouth daily. 30 tablet 5  . B-D ULTRAFINE III SHORT PEN 31G X 8 MM MISC USE TO INJECT INSULIN 4 TIMES DAILY OR AS DIRECTED. 100 each 5  . baclofen (LIORESAL) 10 MG tablet TAKE 1 TABLET BY MOUTH TWICE A DAY 60 tablet 2  .  Blood Glucose Monitoring Suppl (ACCU-CHEK AVIVA PLUS) w/Device KIT 1 Device by Does not apply route daily. 1 kit 3  . busPIRone (BUSPAR) 15 MG tablet Take 1 tablet (15 mg total) by mouth 2 (two) times daily. 60 tablet 11  . diclofenac Sodium (VOLTAREN) 1 % GEL Apply 4 g topically 4 (four) times daily as needed. 100 g 3  . famotidine (PEPCID) 20 MG tablet Take 20 mg by mouth daily.    Marland Kitchen glucose blood (ACCU-CHEK AVIVA PLUS) test strip CHECK BLOOD SUGAR FIRST   THING IN THE MORNING BEFORE EATING, AFTER LUNCH, AND  AFTER DINNER  TOTAL 3 TIMES DAILY 300 strip 3  . hydrocortisone 2.5 % cream Apply topically 2 (two) times daily. 30 g 0  . insulin aspart (NOVOLOG FLEXPEN) 100 UNIT/ML FlexPen Inject 9-18 Units into the skin See admin instructions. Inject 15 units subcutaneously daily with breakfast, inject 9 units with lunch as needed for CBG 180 or more, and inject 9 units with supper 15 mL 3  . ketorolac (ACULAR) 0.5 % ophthalmic solution     . LANTUS SOLOSTAR 100 UNIT/ML Solostar Pen INJECT 65 UNITS INTO THE SKIN DAILY. 15 mL 6  . lidocaine (XYLOCAINE) 2 % solution SMARTSIG:By Mouth    . loratadine (CLARITIN) 10 MG tablet Take 10 mg by mouth daily.    . metFORMIN (GLUCOPHAGE-XR) 500 MG 24 hr tablet Take 500 mg by mouth daily with breakfast.    . moxifloxacin (VIGAMOX) 0.5 % ophthalmic solution     . Multiple Vitamin (MULTIVITAMIN WITH MINERALS) TABS tablet Take 1 tablet by mouth daily.    . pantoprazole (PROTONIX) 40 MG tablet Take 1 tablet (40 mg total) by mouth 2 (two) times daily. 180 tablet 1  . prednisoLONE acetate (PRED FORTE) 1 % ophthalmic suspension     . ramelteon (ROZEREM) 8 MG tablet Take 0.5 tablets (4 mg total) by mouth at bedtime. 30 tablet 0  . sertraline (ZOLOFT) 100 MG tablet Take 1 tablet (100 mg total) by mouth daily. 30 tablet 11  . traZODone (DESYREL) 50 MG tablet TAKE 1/2 1 TABLET BY MOUTH AT BEDTIME AS NEEDED FOR SLEEP    . valsartan (DIOVAN) 320 MG tablet Take 1 tablet (320 mg total) by mouth daily. 30 tablet 11  . [DISCONTINUED] busPIRone (BUSPAR) 15 MG tablet Take 1 tablet (15 mg total) by mouth 2 (two) times daily. 60 tablet 2  . [DISCONTINUED] busPIRone (BUSPAR) 15 MG tablet TAKE 1 TABLET (15 MG TOTAL) BY MOUTH 2 (TWO) TIMES DAILY. 60 tablet 2  . [DISCONTINUED] glucose blood (ACCU-CHEK AVIVA PLUS) test strip CHECK BLOOD SUGAR FIRST  THING IN THE MORNING BEFORE EATING, AFTER LUNCH, AND  AFTER DINNER TOTAL 3 TIMES  DAILY 300 each 3   . [DISCONTINUED] torsemide (DEMADEX) 10 MG tablet TAKE 1 TABLET BY MOUTH EVERY DAY AS NEEDED 30 tablet 0  . [DISCONTINUED] torsemide (DEMADEX) 10 MG tablet Take 1 tablet (10 mg total) by mouth daily as needed. 30 tablet 0   No current facility-administered medications on file prior to visit.     Allergies  Allergen Reactions  . Baclofen Other (See Comments)    Caused leg weakness  . Hctz [Hydrochlorothiazide] Other (See Comments)    Uric acid elevation and gout.     . Lotensin [Benazepril] Other (See Comments)    Dry cough   . Victoza [Liraglutide] Other (See Comments)    Tongue Glossitus  . Acyclovir And Related Diarrhea  . Beta Adrenergic Blockers Other (  See Comments)    Occurred with metoprolol  REACTION: coughing  . Losartan Diarrhea  . Other Diarrhea and Other (See Comments)    Occurred with metoprolol  REACTION: coughing    Social History   Occupational History  . Occupation: retired-office work  Tobacco Use  . Smoking status: Never Smoker  . Smokeless tobacco: Never Used  Vaping Use  . Vaping Use: Never used  Substance and Sexual Activity  . Alcohol use: No  . Drug use: No  . Sexual activity: Not Currently    Family History  Problem Relation Age of Onset  . Stroke Mother   . Heart disease Father   . Stroke Father   . Hypertension Father   . Cancer Sister   . Cancer Brother   . Heart disease Brother   . Diabetes Maternal Uncle     Immunization History  Administered Date(s) Administered  . Influenza Split 11/05/2011, 09/20/2012  . Influenza Whole 11/15/2007, 10/24/2009, 09/25/2010  . Influenza, High Dose Seasonal PF 09/07/2018  . Influenza,inj,Quad PF,6+ Mos 10/08/2014, 10/10/2015, 09/26/2017  . Influenza-Unspecified 10/19/2013, 10/03/2016, 10/26/2019  . PFIZER SARS-COV-2 Vaccination 02/22/2020, 03/25/2020, 10/09/2020  . Pneumococcal Conjugate-13 09/26/2017  . Pneumococcal Polysaccharide-23 10/20/2004  . Td 10/20/2002  . Tdap 11/21/2017      Objective: There were no vitals filed for this visit.  Rebecca Walter is a pleasant 79 y.o. Caucasian female  IN NAD. AAO X 3.  Capillary refill time to digits immediate b/l. Palpable DP pulses b/l. Palpable PT pulses b/l. Pedal hair present b/l. Skin temperature gradient within normal limits b/l.  Pedal skin with normal turgor, texture and tone bilaterally. No open wounds bilaterally. No interdigital macerations bilaterally. Toenails 1-5 b/l elongated, dystrophic, thickened, crumbly with subungual debris and tenderness to dorsal palpation.  Normal muscle strength 5/5 to all lower extremity muscle groups bilaterally, no pain crepitus or joint limitation noted with ROM b/l and hammertoes noted to the  L 2nd toe and R 2nd toe.  Protective sensation intact 5/5 intact bilaterally with 10g monofilament b/l Vibratory sensation intact b/l Proprioception intact bilaterally.  Hemoglobin A1C Latest Ref Rng & Units 10/09/2020 05/20/2020 01/11/2020  HGBA1C 4.0 - 5.6 % 6.3(A) 6.5 6.6  Some recent data might be hidden   Assessment: 1. Pain due to onychomycosis of toenails of both feet   2. Acquired hammertoes of both feet   3. Type 2 diabetes mellitus with diabetic autonomic neuropathy, with long-term current use of insulin ([place])     Plan: -Examined patient. -No new findings. No new orders. -Continue diabetic foot care principles. -Toenails 1-5 b/l were debrided in length and girth with sterile nail nippers and dremel without iatrogenic bleeding.  -Patient to report any pedal injuries to medical professional immediately. -Patient to continue soft, supportive shoe gear daily. -Patient/POA to call should there be question/concern in the interim.  Return in about 3 months (around 01/08/2021).

## 2020-10-17 ENCOUNTER — Other Ambulatory Visit: Payer: Self-pay

## 2020-10-17 ENCOUNTER — Other Ambulatory Visit: Payer: Self-pay | Admitting: Family Medicine

## 2020-10-17 ENCOUNTER — Other Ambulatory Visit: Payer: Medicare Other

## 2020-10-17 DIAGNOSIS — I5032 Chronic diastolic (congestive) heart failure: Secondary | ICD-10-CM

## 2020-10-18 ENCOUNTER — Other Ambulatory Visit: Payer: Self-pay | Admitting: Family Medicine

## 2020-10-18 DIAGNOSIS — I340 Nonrheumatic mitral (valve) insufficiency: Secondary | ICD-10-CM

## 2020-10-18 LAB — BASIC METABOLIC PANEL
BUN/Creatinine Ratio: 25 (ref 12–28)
BUN: 26 mg/dL (ref 8–27)
CO2: 24 mmol/L (ref 20–29)
Calcium: 9.9 mg/dL (ref 8.7–10.3)
Chloride: 103 mmol/L (ref 96–106)
Creatinine, Ser: 1.04 mg/dL — ABNORMAL HIGH (ref 0.57–1.00)
GFR calc Af Amer: 58 mL/min/{1.73_m2} — ABNORMAL LOW (ref >59)
GFR calc non Af Amer: 51 mL/min/{1.73_m2} — ABNORMAL LOW (ref >59)
Glucose: 104 mg/dL — ABNORMAL HIGH (ref 65–99)
Potassium: 4.5 mmol/L (ref 3.5–5.2)
Sodium: 146 mmol/L — ABNORMAL HIGH (ref 134–144)

## 2020-10-20 ENCOUNTER — Other Ambulatory Visit: Payer: Self-pay | Admitting: Family Medicine

## 2020-10-29 ENCOUNTER — Telehealth (INDEPENDENT_AMBULATORY_CARE_PROVIDER_SITE_OTHER): Payer: Medicare Other | Admitting: Family Medicine

## 2020-10-29 ENCOUNTER — Other Ambulatory Visit: Payer: Self-pay

## 2020-10-29 ENCOUNTER — Encounter: Payer: Self-pay | Admitting: Family Medicine

## 2020-10-29 VITALS — BP 160/67 | HR 61 | Ht 61.0 in | Wt 190.0 lb

## 2020-10-29 DIAGNOSIS — I5032 Chronic diastolic (congestive) heart failure: Secondary | ICD-10-CM | POA: Diagnosis not present

## 2020-10-29 DIAGNOSIS — Z794 Long term (current) use of insulin: Secondary | ICD-10-CM

## 2020-10-29 DIAGNOSIS — E1143 Type 2 diabetes mellitus with diabetic autonomic (poly)neuropathy: Secondary | ICD-10-CM | POA: Diagnosis not present

## 2020-10-29 DIAGNOSIS — R7989 Other specified abnormal findings of blood chemistry: Secondary | ICD-10-CM

## 2020-10-29 DIAGNOSIS — M170 Bilateral primary osteoarthritis of knee: Secondary | ICD-10-CM

## 2020-10-29 NOTE — Progress Notes (Signed)
Cardiology Office Note   Date:  10/30/2020   ID:  Rebecca Walter, DOB 01/07/1941, MRN [id number]  PCP:  [name], DO  Cardiologist:   Dorris Carnes, MD   F/U of HTN    Patient Profile: Rebecca Walter is a 79 y.o. female with:  Chronic Diastolic CHF ? Echocardiogram 01/2020: EF 70-75, Gr 1 DD  Valvular Heart Disease ? Mild AI, mild MR, mod PI  Pulmonary Hypertension  ? Echocardiogram 01/2020: RVSP 45.8  Hypertension  ? Hx of intol to carvedilol, clonidine  ? 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 Monitor: no sig arrhythmia; Carotid US: mild bilat plaque (no sig dz)  Myoview 9/18: Low Risk, small ant-apical ischemia vs shifting breast atten; asymptomatic >> Med Rx  Myoview 7/21: No ischemia, low risk   Prior CV Studies: Myoview 06/26/20 Apical thinning, no ischemia or infarction, EF > 65; low risk   Echocardiogram 01/29/2020 EF 70-75, no RWMA, Gr 1 DD, mild RVE, normal RVSF, RVSP 45.8, mild to mod LAE, trivial MR, mild PI  LT Cardiac Monitor 12/2019 Sinus rhythm 28 to 160 bpm Average 59 bpm Occasional PVCs, PAC Rare burst NSVT Longest 8 beats  Carotid US 12/12/2019 Bilat ICA 1-39; L vertebral artery stenosis, R subclavian stenosis  Myoview 09/06/17 EF 67, small area of apicoseptal ischemia; low risk  Echocardiogram 07/16/17 Mild LVH, EF 60-65, Gr 1 DD, mild AI, mild MR, mild LAE, mod PI, PASP 35 Trivial eff   History of Present Illness: Rebecca Walter is a 79 y.o. female with a history of HTN, HL, DM, GERD, diastolic CHF  Pt had stress test remotely    Hx of hypertensive emergency in past  Last echo  2018 LVEF 60 to 75%  Mild diastolic dysfunction.   Myovue low risk in 2018 No significant ischemia   I saw there in Dec 2020    She was then seen by Kathleen Argue in [place] 2021  He ordered a CXR for SOB  Unremarkable, BNP normal; Myvoue without ischemia   Seen in Follow up  Rx PPI for GERD    The pt says she still gets SOB with walking   No change since she was seen   She denies CP     Current Meds  Medication Sig  . acetaminophen (TYLENOL) 325 MG tablet Take 325 mg by mouth every 8 (eight) hours as needed (pain).   Marland Kitchen allopurinol (ZYLOPRIM) 100 MG tablet TAKE 2 TABLETS BY MOUTH EVERY DAY  . aluminum-magnesium hydroxide-simethicone (MAALOX) 916-384-66 MG/5ML SUSP Take 15 mLs by mouth 2 (two) times daily as needed.  Marland Kitchen amLODipine (NORVASC) 10 MG tablet Take 1 tablet (10 mg total) by mouth daily.  Marland Kitchen amLODipine (NORVASC) 2.5 MG tablet TAKE 1 TABLET 2 (TWO) TIMES DAILY AS NEEDED (FOR ELEVATED BLOOD PRESSURE SYSTOLIC >599 ONLY).  Marland Kitchen aspirin 81 MG chewable tablet Chew 1 tablet (81 mg total) by mouth daily.  . B-D ULTRAFINE III SHORT PEN 31G X 8 MM MISC USE TO INJECT INSULIN 4 TIMES DAILY OR AS DIRECTED.  Marland Kitchen baclofen (LIORESAL) 10 MG tablet TAKE 1 TABLET BY MOUTH TWICE A DAY  . Blood Glucose Monitoring Suppl (ACCU-CHEK AVIVA PLUS) w/Device KIT 1 Device by Does not apply  route daily.  . busPIRone (BUSPAR) 15 MG tablet Take 1 tablet (15 mg total) by mouth 2 (two) times daily.  . diclofenac Sodium (VOLTAREN) 1 % GEL Apply 4 g topically 4 (four) times daily as needed.  . famotidine (PEPCID) 20 MG tablet Take 20 mg by mouth daily.  Marland Kitchen glucose blood (ACCU-CHEK AVIVA PLUS) test strip CHECK BLOOD SUGAR FIRST  THING IN THE MORNING BEFORE EATING, AFTER LUNCH, AND  AFTER DINNER  TOTAL 3 TIMES DAILY  . hydrocortisone 2.5 % cream Apply topically 2 (two) times daily.  . insulin aspart (NOVOLOG FLEXPEN) 100 UNIT/ML FlexPen Inject 9-18 Units into the skin See admin instructions. Inject 15 units subcutaneously daily with breakfast, inject 9 units with lunch as needed for CBG 180 or more, and inject 9 units with supper  . ketorolac (ACULAR) 0.5 %  ophthalmic solution Place 1 drop into both eyes 4 (four) times daily.   Marland Kitchen LANTUS SOLOSTAR 100 UNIT/ML Solostar Pen INJECT 65 UNITS INTO THE SKIN DAILY.  Marland Kitchen lidocaine (XYLOCAINE) 2 % solution SMARTSIG:By Mouth  . loratadine (CLARITIN) 10 MG tablet Take 10 mg by mouth daily.  Marland Kitchen lovastatin (ALTOPREV) 60 MG 24 hr tablet Take 1 tablet (60 mg total) by mouth at bedtime. (Patient taking differently: Take 40 mg by mouth at bedtime. )  . metFORMIN (GLUCOPHAGE-XR) 500 MG 24 hr tablet Take 500 mg by mouth daily with breakfast.  . moxifloxacin (VIGAMOX) 0.5 % ophthalmic solution Place 1 drop into both eyes 3 (three) times daily.   . Multiple Vitamin (MULTIVITAMIN WITH MINERALS) TABS tablet Take 1 tablet by mouth daily.  . pantoprazole (PROTONIX) 40 MG tablet Take 1 tablet (40 mg total) by mouth 2 (two) times daily.  . prednisoLONE acetate (PRED FORTE) 1 % ophthalmic suspension Place 1 drop into both eyes as directed.   . ramelteon (ROZEREM) 8 MG tablet Take 0.5 tablets (4 mg total) by mouth at bedtime.  . sertraline (ZOLOFT) 100 MG tablet Take 1 tablet (100 mg total) by mouth daily.  . sertraline (ZOLOFT) 50 MG tablet TAKE 1 TABLET IN THE AFTERNOON, ONE DOSE AT BEDTIME  . spironolactone (ALDACTONE) 25 MG tablet Take 1 tablet (25 mg total) by mouth daily.  Marland Kitchen torsemide (DEMADEX) 10 MG tablet TAKE 2 TABLETS (20 MG TOTAL) BY MOUTH DAILY AS NEEDED (EDEMA, >3LB WEIGHT GAIN).  . valsartan (DIOVAN) 320 MG tablet Take 1 tablet (320 mg total) by mouth daily.     Allergies:   Baclofen, Hctz [hydrochlorothiazide], Lotensin [benazepril], Victoza [liraglutide], Acyclovir and related, Beta adrenergic blockers, Losartan, and Other   Past Medical History:  Diagnosis Date  . Anxiety   . Arthritis   . Candidiasis of skin 02/14/2020  . Chronic diastolic CHF (congestive heart failure) (HCC)    Echocardiogram 01/2020: EF 70-75, no RWMA, Gr 1 DD, normal RVSF, RVSP 45.8, mild to mod LAE, trivial MR, mild PI  . Diabetes  mellitus   . Epistaxis 02/04/2020  . GERD (gastroesophageal reflux disease)   . Gout   . History of nuclear stress test    Myoview 06/2020: EF 75, no ischemia or infarction, low risk  . Hyperlipidemia   . Hypertension   . Hypertensive heart disease with CHF (congestive heart failure) ([place])   . Mild aortic stenosis 06/2017  . Mild mitral regurgitation 06/2017  . Moderate pulmonary valve insufficiency 06/2017  . Osteopenia     Past Surgical History:  Procedure Laterality Date  . KIDNEY SURGERY    . KNEE SURGERY    .  MENISECTOMY    . WRIST SURGERY       Social History:  The patient  reports that she has never smoked. She has never used smokeless tobacco. She reports that she does not drink alcohol and does not use drugs.   Family History:  The patient's family history includes Cancer in her brother and sister; Diabetes in her maternal uncle; Heart disease in her brother and father; Hypertension in her father; Stroke in her father and mother.    ROS:  Please see the history of present illness. All other systems are reviewed and  Negative to the above problem except as noted.    PHYSICAL EXAM: VS:  BP 132/60   Pulse 70   Ht _0  (1.549 m)   Wt 197 lb (89.4 kg)   LMP  (LMP Unknown)   SpO2 95%   BMI 37.22 kg/m    GEN: Morbidly obese 79 yo , in no acute distress  HEENT: normal  Neck: JVP normal  Cardiac: RRR; no murmurs,   No LE edema   Respiratory:  clear to auscultation bilaterally GI: Obese  MS:  Moving all extremities   Skin: warm  Neuro:  CN II to XII grossly intact   No gross abnormalties    Psych: euthymic mood, full affect   EKG:  EKG is not ordered today.   Lipid Panel    Component Value Date/Time   CHOL 197 05/20/2020 1728   TRIG 176 (H) 05/20/2020 1728   HDL 51 05/20/2020 1728   CHOLHDL 3.9 05/20/2020 1728   CHOLHDL 4.2 07/16/2017 0402   VLDL 50 (H) 07/16/2017 0402   LDLCALC 115 (H) 05/20/2020 1728   LDLDIRECT 103 (H) 03/11/2010 2127      Wt  Readings from Last 3 Encounters:  10/30/20 197 lb (89.4 kg)  10/29/20 190 lb (86.2 kg)  10/09/20 197 lb 12.8 oz (89.7 kg)      ASSESSMENT AND PLAN:  1  Dyspnea   Chronic   No change since last seen   Myovue recently showed no ischemia    On exam volume appears OK I would continue to follow   2  HTN  BP is controlled  Continue current regimen    3  HL   L  Continue statin  Last lipids LDL 115   HDL 51  4  Diastolic CHF  Volume status is OK  Keep on same meds  Labs were OK     Current medicines are reviewed at length with the patient today.  The patient does not have concerns regarding medicines.  Signed, Dorris Carnes, MD  10/30/2020 2:05 PM    [address], [place], [place]  [id number] Phone: [phone]; Fax: [phone]

## 2020-10-29 NOTE — Progress Notes (Signed)
[facility] Telemedicine Visit  Patient consented to have virtual visit and was identified by name and date of birth. Method of visit: Telephone  Encounter participants: Patient: Rebecca Walter - located at home Provider: [name] - located at [facility] Others (if applicable): None   Chief Complaint: F/u   HPI: Rebecca Walter is an 79 year old female presenting via phone for follow-up and to generally check in.  She states she overall is doing well. She recently had cornea surgery on her left eye, recovering well.   Diabetes: Recent A1c 6.3.  Recently decreased her morning NovoLog from 18 to 10 units following breakfast given hypoglycemic readings. She has been giving anywhere from 10-14 U pending her sugar after breakfast. Fasting glucose usually around 90-100. After meals around 130-140. Denies any further low sugars (<70), denies any lightheadedness/dizziness, sweating, or acute fatigue episodes.   She has follow up with her cardiologist tomorrow. She would to clarify diuretic dose, feels like she has fluid on her belly all the time.   She has not gotten her flu shot yet. Tolerated her covid booster well.    ROS: per HPI  Pertinent PMHx: CHF, hypertension, OSA, GERD,severe anxiety, type 2 diabetes, history of hyponatremia, restless leg syndrome, insomnia   Exam:  BP (!) 160/67   Pulse 61   Ht 5\' 1"  (1.549 m)   Wt 190 lb (86.2 kg)   LMP  (LMP Unknown)   BMI 35.90 kg/m   Respiratory: No increased WOB , speaking in full sentences   Assessment/Plan:  Diabetes mellitus, type II (HCC) A1c 6.3, given her age and comorbidities would actually strive for a less stringent goal.  Reassuringly no hypoglycemic episodes currently.  Recommended decreasing Lantus to 60U from 65, may continue breakfast NovoLog around 10U and metformin as is.  Suspect in the future could likely continue to go down on Lantus, however will decrease in stepwise fashion.  Elevated  TSH Will recheck TSH in 12/2020.   Chronic diastolic CHF (congestive heart failure) ([place]) Has follow-up with her cardiologist tomorrow, 11/11.  Will clarify diuretic dosing appropriate for her, previously has been dosed as PRN however she has been taking this on a daily basis.  Osteoarthritis of both knees Discussed increasing physical activity as she possibly can.  Recommend trialing water aerobics to off put weightbearing.    Scheduled virtual follow up 11/26/2020 with myself during visit.    Time spent during visit with patient: 20 minutes  [name], DO

## 2020-10-29 NOTE — Assessment & Plan Note (Addendum)
Has follow-up with her cardiologist tomorrow, 11/11.  Will clarify diuretic dosing appropriate for her, previously has been dosed as PRN however she has been taking this on a daily basis.

## 2020-10-29 NOTE — Assessment & Plan Note (Signed)
Discussed increasing physical activity as she possibly can.  Recommend trialing water aerobics to off put weightbearing.

## 2020-10-29 NOTE — Assessment & Plan Note (Signed)
Will recheck TSH in 12/2020.

## 2020-10-29 NOTE — Assessment & Plan Note (Signed)
A1c 6.3, given her age and comorbidities would actually strive for a less stringent goal.  Reassuringly no hypoglycemic episodes currently.  Recommended decreasing Lantus to 60U from 65, may continue breakfast NovoLog around 10U and metformin as is.  Suspect in the future could likely continue to go down on Lantus, however will decrease in stepwise fashion.

## 2020-10-30 ENCOUNTER — Other Ambulatory Visit: Payer: Self-pay

## 2020-10-30 ENCOUNTER — Other Ambulatory Visit: Payer: Self-pay | Admitting: Family Medicine

## 2020-10-30 ENCOUNTER — Other Ambulatory Visit: Payer: Self-pay | Admitting: *Deleted

## 2020-10-30 ENCOUNTER — Ambulatory Visit: Payer: Medicare Other | Admitting: Internal Medicine

## 2020-10-30 ENCOUNTER — Encounter: Payer: Self-pay | Admitting: Internal Medicine

## 2020-10-30 VITALS — BP 132/60 | HR 70 | Ht 61.0 in | Wt 197.0 lb

## 2020-10-30 DIAGNOSIS — F419 Anxiety disorder, unspecified: Secondary | ICD-10-CM

## 2020-10-30 DIAGNOSIS — E782 Mixed hyperlipidemia: Secondary | ICD-10-CM

## 2020-10-30 DIAGNOSIS — R0600 Dyspnea, unspecified: Secondary | ICD-10-CM | POA: Diagnosis not present

## 2020-10-30 MED ORDER — BUSPIRONE HCL 15 MG PO TABS: 15.0000 mg | ORAL_TABLET | Freq: Two times a day (BID) | ORAL | 11 refills | Status: DC

## 2020-10-30 NOTE — Patient Instructions (Signed)
Medication Instructions:  No changes *If you need a refill on your cardiac medications before your next appointment, please call your pharmacy*   Lab Work: none If you have labs (blood work) drawn today and your tests are completely normal, you will receive your results only by: . MyChart Message (if you have MyChart) OR . A paper copy in the mail If you have any lab test that is abnormal or we need to change your treatment, we will call you to review the results.   Testing/Procedures: none   Follow-Up: At [facility], you and your health needs are our priority.  As part of our continuing mission to provide you with exceptional heart care, we have created designated Provider Care Teams.  These Care Teams include your primary Cardiologist (physician) and Advanced Practice Providers (APPs -  Physician Assistants and Nurse Practitioners) who all work together to provide you with the care you need, when you need it.   Your next appointment:   6 month(s)  The format for your next appointment:   In Person  Provider:   You may see Paula Ross, MD or one of the following Advanced Practice Providers on your designated Care Team:    Scott Weaver, PA-C  Vin Bhagat, PA-C   Other Instructions   

## 2020-11-06 ENCOUNTER — Other Ambulatory Visit: Payer: Self-pay | Admitting: Family Medicine

## 2020-11-06 DIAGNOSIS — I340 Nonrheumatic mitral (valve) insufficiency: Secondary | ICD-10-CM

## 2020-11-10 MED ORDER — BUSPIRONE HCL 15 MG PO TABS: 15.0000 mg | ORAL_TABLET | Freq: Two times a day (BID) | ORAL | 11 refills | Status: DC

## 2020-11-12 ENCOUNTER — Telehealth: Payer: Self-pay

## 2020-11-12 NOTE — Telephone Encounter (Signed)
Patient calls nurse line regarding tingling in fingers and hands. Patient reports that this is primarily in the right side with occasional discomfort in left hand and fingers. Patient reports blood sugar of 159 this AM, after breakfast 214, self administered Novolog at this time.   Patient denies any other symptoms currently. Patient would like to speak to Dr. Higinio Plan regarding how she should proceed. Patient has tried gabapentin in the past with little success.   Patient is scheduled for next available visit with PCP on 12/8.   Please advise additional recommendations for patient.   Strict return/ ED precautions given.   Talbot Grumbling, RN

## 2020-11-19 NOTE — Telephone Encounter (Signed)
I unfortunately have not been able to contact [name] due to consistent night shifts. Can we please check in to assess if this is worsening at all? I see she has a follow up appointment on 12/8, however I will try to give her a call later this week once back on regular schedule.    Rebecca Clan, Rebecca Walter

## 2020-11-20 ENCOUNTER — Other Ambulatory Visit: Payer: Self-pay | Admitting: Family Medicine

## 2020-11-20 NOTE — Telephone Encounter (Signed)
Spoke with pt and she says it happens in the morning when she is texting. Its her right hand and first 2 fingers. Sometimes she runs them under warm water. Is ok with keeping appt on 12/8. Shreyansh Tiffany Kennon Holter, CMA

## 2020-11-21 ENCOUNTER — Telehealth: Payer: Self-pay | Admitting: *Deleted

## 2020-11-21 ENCOUNTER — Other Ambulatory Visit: Payer: Self-pay | Admitting: Family Medicine

## 2020-11-21 MED ORDER — GABAPENTIN 100 MG PO CAPS
100.0000 mg | ORAL_CAPSULE | Freq: Every day | ORAL | 0 refills | Status: DC
Start: 2020-11-21 — End: 2020-12-22

## 2020-11-21 NOTE — Telephone Encounter (Signed)
Sent a 30 day supply of gabapentin as she is re-trialing this for neuropathy. Will further discuss on 12/8.   [name], DO

## 2020-11-21 NOTE — Telephone Encounter (Signed)
Rx request for gabapentin 100mg   Caps. Not on med list. Please advise. Abbygail [place] Kennon Holter, CMA

## 2020-11-26 ENCOUNTER — Telehealth (INDEPENDENT_AMBULATORY_CARE_PROVIDER_SITE_OTHER): Payer: Medicare Other | Admitting: Family Medicine

## 2020-11-26 ENCOUNTER — Other Ambulatory Visit: Payer: Self-pay

## 2020-11-26 ENCOUNTER — Encounter: Payer: Self-pay | Admitting: Family Medicine

## 2020-11-26 DIAGNOSIS — E1143 Type 2 diabetes mellitus with diabetic autonomic (poly)neuropathy: Secondary | ICD-10-CM | POA: Diagnosis not present

## 2020-11-26 DIAGNOSIS — Z794 Long term (current) use of insulin: Secondary | ICD-10-CM

## 2020-11-26 DIAGNOSIS — G5601 Carpal tunnel syndrome, right upper limb: Secondary | ICD-10-CM | POA: Insufficient documentation

## 2020-11-26 HISTORY — DX: Carpal tunnel syndrome, right upper limb: G56.01

## 2020-11-26 MED ORDER — WRIST SPLINT/COCK-UP/RIGHT SM MISC: 0 refills | Status: DC

## 2020-11-26 NOTE — Assessment & Plan Note (Signed)
Well-controlled.  Will continue to monitor CBGs with low threshold to decrease Lantus down to 55 units if any further hypoglycemic episodes, she would like to continue 60U as is for now which is reasonable.

## 2020-11-26 NOTE — Progress Notes (Signed)
[place] Telemedicine Visit  Patient consented to have virtual visit and was identified by name and date of birth. Method of visit: Telephone  Encounter participants: Patient: Rebecca Walter - located at home  Provider: [name] - located at [facility] Others (if applicable): Leonia Corona, CMA  Chief Complaint: Hand tingling  HPI: Rebecca Walter is an 79 year old female presenting via telephone to discuss the following:  Hand tingling: This is been present intermittently for approximately the past 2 weeks, has noticed it more constant for the past week.  She has had this in the past however would go away more quickly than now.  Predominantly her right hand, every once a while will have a tingle in her left but not significant to her.  Notices it the most when she is texting on her phone in the morning.  Has woken her up at night and she has to shake out her hand.  Tingling present at her thumb, pointer finger, and middle finger only.  Does not have any tingling on the rest of her arm.  Hand does feel more tired and stiff than usual, has had a more difficult time gripping things and has lost her hand grip on occasion.  She denies any new acute neck pain, facial droop, extremity weakness, lightheadedness/dizziness.  She is worried that this is related to peripheral neuropathy and her anxiety.  Diabetes: Reports she is doing well with a decreased Lantus.  She has had one low sugar of 59 and reports that was because she hadn't eaten in a while, otherwise fasting has been around 130-140.  ROS: per HPI  Pertinent PMHx: CHF, hypertension, OSA, GERD,severe anxiety, type 2 diabetes, history of hyponatremia, restless leg syndrome, insomnia   Exam:  LMP  (LMP Unknown)   Respiratory: No increased work of breathing, speaking in full sentences  Assessment/Plan:  Carpal tunnel syndrome of right wrist Suspected by symptoms described over the phone, in the setting of repetitive  texting motion.  Rx'd cock up splint to wear at least nightly. Encouraged decreasing repetitive motion and can continue arthritic management as is for likely contributory arthritis.  Given her presentation, recommended that she would benefit from a formal evaluation in person to ensure no additional contributor or alternative etiology, thus scheduled in office on 12/15 with myself.  Diabetes mellitus, type II ([place]) Well-controlled.  Will continue to monitor CBGs with low threshold to decrease Lantus down to 55 units if any further hypoglycemic episodes, she would like to continue 60U as is for now which is reasonable.    Follow-up visit scheduled for 12/15 while on the phone.  Instructed she may call our clinic to reschedule to a later time in the day if needed as she relies on her son for transportation.  Time spent during visit with patient: 25 minutes  [name], DO

## 2020-11-26 NOTE — Assessment & Plan Note (Signed)
Suspected by symptoms described over the phone, in the setting of repetitive texting motion.  Rx'd cock up splint to wear at least nightly. Encouraged decreasing repetitive motion and can continue arthritic management as is for likely contributory arthritis.  Given her presentation, recommended that she would benefit from a formal evaluation in person to ensure no additional contributor or alternative etiology, thus scheduled in office on 12/15 with myself.

## 2020-12-03 ENCOUNTER — Other Ambulatory Visit: Payer: Self-pay

## 2020-12-03 ENCOUNTER — Ambulatory Visit: Payer: Medicare Other | Admitting: Family Medicine

## 2020-12-03 ENCOUNTER — Encounter: Payer: Self-pay | Admitting: Family Medicine

## 2020-12-03 VITALS — BP 130/70 | HR 69 | Ht 61.0 in | Wt 199.0 lb

## 2020-12-03 DIAGNOSIS — Z23 Encounter for immunization: Secondary | ICD-10-CM

## 2020-12-03 DIAGNOSIS — R7989 Other specified abnormal findings of blood chemistry: Secondary | ICD-10-CM

## 2020-12-03 DIAGNOSIS — G5601 Carpal tunnel syndrome, right upper limb: Secondary | ICD-10-CM

## 2020-12-03 DIAGNOSIS — Z0000 Encounter for general adult medical examination without abnormal findings: Secondary | ICD-10-CM | POA: Diagnosis not present

## 2020-12-03 MED ORDER — WRIST SPLINT/COCK-UP/RIGHT SM MISC: 0 refills | Status: DC

## 2020-12-03 NOTE — Patient Instructions (Addendum)
[facility]: Cock-up wrist splint : start wearing this nightly and minimize texting. Follow up if any weakness or numbness.   Debrox Ear cleaning solution to help soften ear wax.

## 2020-12-03 NOTE — Progress Notes (Signed)
    SUBJECTIVE:   CHIEF COMPLAINT / HPI: F/u hand tingling   Rebecca Walter is a 79 yo female presenting for follow up hand tingling. This was recently discussed via telephone on 12/8 and felt to be from carpal tunnel syndrome, however recommended in-person evaluation.   Today she reports the tingling in her thumb, pointer finger, and part of middle finger has improved. She seems to mainly notice it now when she is texting frequently in the morning and occasionally waking up at night to shake out her hand. She has cut down on her texting to help. She hasn't got the splint yet, pharmacy didn't have it. She still is occasionally losing hand grip. Denies any lightheadedness/dizziness, numbness or extremity weakness elsewhere, facial droop, or change in speech. No new neck pain.   She would like her flu shot and recheck TSH with known history of subclinical hypothyroid.   PERTINENT  PMH / PSH: CHF, hypertension, OSA, GERD,severe anxiety, type 2 diabetes, history of hyponatremia, restless leg syndrome, insomnia  OBJECTIVE:   BP 130/70   Pulse 69   Ht 5\' 1"  (1.549 m)   Wt 199 lb (90.3 kg)   LMP  (LMP Unknown)   SpO2 95%   BMI 37.60 kg/m   General: Alert, NAD HEENT: NCAT, MMM Lungs: No increased WOB  Msk: Moves all extremities spontaneously  Ext: Warm, dry, 2+ radial distal pulses Neuro: Alert and oriented. Full ROM of cervical spine, shoulder joint, and wrists bilaterally. No thenar wasting seen b/l. Negative phalen's and tinel's at wrist and elbow. Negative Finkelstein's test. 5/5 upper extremity strength through hand grip, finger ad/abduction, F/E, wrist F/E, arm F/E/ab/adduction. Sensation to light touch intact throughout upper extremity. 5/5 BLE strength. Negative spurling's test.   ASSESSMENT/PLAN:   Carpal tunnel syndrome of right wrist Acute, improving, with classic distribution and presentation. No additional s/sx on exam concerning for more proximal etiology and reassuringly with  intact strength. Printed Rx of splint and provided information to buy online as alternative option, wear at least nightly. Minimize repetitive movements. F/u if not improving in the next few months or sooner if worsening numbness/perceived weakness.   Elevated TSH History of subclinical hypothyroidism not on replacement therapy. Last checked in 12/2019, will recheck today.   Healthcare maintenance Received flu shot today.     F/u in 1 month for above concerns/chronic conditions, or sooner if needed.   [name], [place]

## 2020-12-04 ENCOUNTER — Encounter: Payer: Self-pay | Admitting: Family Medicine

## 2020-12-04 LAB — TSH+FREE T4
Free T4: 0.95 ng/dL (ref 0.82–1.77)
TSH: 4.54 u[IU]/mL — ABNORMAL HIGH (ref 0.450–4.500)

## 2020-12-04 NOTE — Assessment & Plan Note (Signed)
Acute, improving, with classic distribution and presentation. No additional s/sx on exam concerning for more proximal etiology and reassuringly with intact strength. Printed Rx of splint and provided information to buy online as alternative option, wear at least nightly. Minimize repetitive movements. F/u if not improving in the next few months or sooner if worsening numbness/perceived weakness.

## 2020-12-04 NOTE — Assessment & Plan Note (Signed)
History of subclinical hypothyroidism not on replacement therapy. Last checked in 12/2019, will recheck today.

## 2020-12-04 NOTE — Assessment & Plan Note (Signed)
Received flu shot today. 

## 2020-12-13 ENCOUNTER — Other Ambulatory Visit: Payer: Self-pay | Admitting: Family Medicine

## 2020-12-16 ENCOUNTER — Telehealth: Payer: Self-pay

## 2020-12-16 NOTE — Telephone Encounter (Signed)
Please call and schedule her for a virtual appointment at her convenience. Thank you!   Allayne Stack, DO

## 2020-12-16 NOTE — Telephone Encounter (Signed)
Pt called requesting that doctor call her back to schedule a virtual apt.

## 2020-12-17 ENCOUNTER — Other Ambulatory Visit: Payer: Self-pay

## 2020-12-17 NOTE — Telephone Encounter (Signed)
Opened in error.   Nashalie Sallis C Nike Southwell, RN  

## 2020-12-19 ENCOUNTER — Other Ambulatory Visit: Payer: Self-pay | Admitting: Family Medicine

## 2020-12-22 ENCOUNTER — Other Ambulatory Visit: Payer: Self-pay | Admitting: Family Medicine

## 2020-12-22 ENCOUNTER — Other Ambulatory Visit: Payer: Self-pay | Admitting: *Deleted

## 2020-12-22 MED ORDER — VALSARTAN 320 MG PO TABS
320.0000 mg | ORAL_TABLET | Freq: Every day | ORAL | 2 refills | Status: DC
Start: 2020-12-22 — End: 2021-09-11

## 2020-12-23 ENCOUNTER — Other Ambulatory Visit: Payer: Self-pay | Admitting: *Deleted

## 2020-12-23 DIAGNOSIS — I340 Nonrheumatic mitral (valve) insufficiency: Secondary | ICD-10-CM

## 2020-12-24 MED ORDER — TORSEMIDE 10 MG PO TABS: ORAL_TABLET | ORAL | 2 refills | Status: DC

## 2020-12-24 MED ORDER — ALLOPURINOL 100 MG PO TABS
200.0000 mg | ORAL_TABLET | Freq: Every day | ORAL | 2 refills | Status: DC
Start: 2020-12-24 — End: 2022-07-16

## 2020-12-25 ENCOUNTER — Other Ambulatory Visit: Payer: Self-pay

## 2020-12-25 ENCOUNTER — Telehealth (INDEPENDENT_AMBULATORY_CARE_PROVIDER_SITE_OTHER): Payer: Medicare Other | Admitting: Family Medicine

## 2020-12-25 ENCOUNTER — Encounter: Payer: Self-pay | Admitting: Family Medicine

## 2020-12-25 VITALS — BP 151/65 | HR 60 | Wt 195.0 lb

## 2020-12-25 DIAGNOSIS — M8580 Other specified disorders of bone density and structure, unspecified site: Secondary | ICD-10-CM

## 2020-12-25 DIAGNOSIS — G5601 Carpal tunnel syndrome, right upper limb: Secondary | ICD-10-CM

## 2020-12-25 DIAGNOSIS — R7989 Other specified abnormal findings of blood chemistry: Secondary | ICD-10-CM

## 2020-12-25 DIAGNOSIS — F419 Anxiety disorder, unspecified: Secondary | ICD-10-CM | POA: Diagnosis not present

## 2020-12-25 DIAGNOSIS — Z780 Asymptomatic menopausal state: Secondary | ICD-10-CM

## 2020-12-25 NOTE — Progress Notes (Signed)
[facility] Telemedicine Visit  Patient consented to have virtual visit and was identified by name and date of birth. Method of visit: Telephone  Encounter participants: Patient: Rebecca Walter - located at home  Provider: Allayne Stack - located at [facility] Others (if applicable): None   Chief Complaint: Check in   HPI: Ms Bortner is an 80 year old female presenting for a virtual follow up.  She reports that she is doing well and has no specific concerns today.  During our conversation, she also has her home health nurse over checking all of her medications.  She was recently seen in person on 12/17 due to tingling in her right thumb/pointer finger/part of middle finger felt to be secondary to carpal tunnel.  She has been using the brace intermittently.  Feels like this has gotten significantly better and will continue to use her brace as needed and avoid repetitive motion.  She also reports that she has done well with the 60 units of Lantus and no hypoglycemic episodes, however is going to monitor closely.  She also says back again December she did have a fall where her foot got caught on a cord/table.  She scratched a piece of her inner thigh which she has been having her home health RN place wound care whenever they visit.  She has no pain in this area or elsewhere, no preceding loss of consciousness, lightheadedness/dizziness, chest pain, or shortness of breath.  Walking is normal.  She denies seeing any surrounding erythema, warmth to touch, or drainage from her cut that she reports is healing nicely.  ROS: per HPI  Pertinent PMHx: CHF, hypertension, OSA, GERD,severe anxiety, type 2 diabetes, history of hyponatremia, restless leg syndrome, insomnia  Exam:  BP (!) 151/65   Pulse 60   Wt 195 lb (88.5 kg)   LMP  (LMP Unknown)   BMI 36.84 kg/m   Respiratory: Breathing comfortably in full sentences  Assessment/Plan:  Anxiety Appears to be reasonly well  controlled currently on Zoloft/BuSpar regimen in addition to frequent check ins.  Scheduled follow-up appointment virtually for next week to check in.  Carpal tunnel syndrome of right wrist Improving.  Will continue brace nightly/as needed and avoiding repetitive motion.  Osteopenia after menopause Last DEXA scan showing osteopenia in 2009 (T score ~2), has never received treatment in the past.  Discussed obtaining DEXA scan to evaluate for underlying osteoporosis, however patient would like to wait until current COVID surge has calm down.  No previous pathologic fractures.  We will discuss on future visits.  Encouraged weightbearing when possible and calcium/vitamin D, can discuss in more detail on follow-up.  Elevated TSH Reviewed recent TSH results showing consistent subclinical hypothyroidism.  No indication for treatment at this time.  Will monitor yearly or sooner if needed.    Time spent during visit with patient: 25 minutes  Follow-up scheduled virtually with myself on 1/12 to check in.   Allayne Stack, DO

## 2020-12-26 ENCOUNTER — Encounter: Payer: Self-pay | Admitting: Family Medicine

## 2020-12-26 NOTE — Assessment & Plan Note (Signed)
Last DEXA scan showing osteopenia in 2009 (T score ~2), has never received treatment in the past.  Discussed obtaining DEXA scan to evaluate for underlying osteoporosis, however patient would like to wait until current COVID surge has calm down.  No previous pathologic fractures.  We will discuss on future visits.  Encouraged weightbearing when possible and calcium/vitamin D, can discuss in more detail on follow-up.

## 2020-12-26 NOTE — Assessment & Plan Note (Signed)
Appears to be reasonly well controlled currently on Zoloft/BuSpar regimen in addition to frequent check ins.  Scheduled follow-up appointment virtually for next week to check in.

## 2020-12-26 NOTE — Assessment & Plan Note (Signed)
Reviewed recent TSH results showing consistent subclinical hypothyroidism.  No indication for treatment at this time.  Will monitor yearly or sooner if needed.

## 2020-12-26 NOTE — Assessment & Plan Note (Signed)
Improving.  Will continue brace nightly/as needed and avoiding repetitive motion.

## 2020-12-31 ENCOUNTER — Other Ambulatory Visit: Payer: Self-pay

## 2020-12-31 ENCOUNTER — Encounter: Payer: Self-pay | Admitting: Family Medicine

## 2020-12-31 ENCOUNTER — Telehealth (INDEPENDENT_AMBULATORY_CARE_PROVIDER_SITE_OTHER): Payer: Medicare Other | Admitting: Family Medicine

## 2020-12-31 VITALS — BP 170/65 | HR 65 | Wt 193.0 lb

## 2020-12-31 DIAGNOSIS — R200 Anesthesia of skin: Secondary | ICD-10-CM

## 2020-12-31 NOTE — Assessment & Plan Note (Addendum)
Acute (possibly recurrent), associated with reported bilateral leg weakness and fatigue.  Unclear etiology and can certainly be multifactorial, given her complaint certainly will bring her in the office tomorrow for formal evaluation.  Suspect most likely related to diabetic neuropathy as she has had difficulty with this in the past, however could also consider contributing leg swelling, anxiety, PAD, and knee osteoarthritis.  MRI lumbar in 2017 showing evidence of spinal stenosis, pending evaluation tomorrow, 1/13, will consider repeating MRI to rule out spinal stenosis as etiology.  In the interim, encouraged elevating her legs with compression stockings, Tylenol, and Voltaren gel for her OA.

## 2020-12-31 NOTE — Progress Notes (Signed)
[place] Telemedicine Visit  Patient consented to have virtual visit and was identified by name and date of birth. Method of visit: Telephone  Encounter participants: Patient: Rebecca Walter - located at home Provider: Patriciaann Walter - located at [facility] Others (if applicable): None   Chief Complaint: Check in   HPI: Ms Rebecca Walter is a 80 year old female presenting via phone to discuss the following:   Numbness: Bilateral lower legs and feet, started last week or so but has had previously (diagnosed with diabetic neuropathy in the past). Feels like her legs (upper and lower) feel weak/fatigued as well, using her walker more. She also states that her home health nurse told her that her legs were more swollen last week as well. L>R, always swells more on this side from previous injury many years ago. Reports good UOP, torsemide 20mg  daily. Wearing her compression stockings daily. Worse in the morning and gets better through the day. She reports knee arthritis and low back pain, worse recently with the cold weather especially in the past week. She also has been anxious more recently. Denies any new urinary/bowel incontinence, or saddle anesthesia.  ROS: per HPI  Pertinent PMHx: CHF, hypertension, OSA, GERD,severe anxiety, type 2 diabetes, history of hyponatremia, restless leg syndrome, insomnia  Exam:  BP (!) 170/65 Comment: per patient  Pulse 65   Wt 193 lb (87.5 kg) Comment: per patient  LMP  (LMP Unknown)   BMI 36.47 kg/m   Respiratory: Speaking in full sentences without difficulty  Assessment/Plan:  Bilateral leg numbness Acute (possibly recurrent), associated with reported bilateral leg weakness and fatigue.  Unclear etiology and can certainly be multifactorial, given her complaint certainly will bring her in the office tomorrow for formal evaluation.  Suspect most likely related to diabetic neuropathy as she has had difficulty with this in the past, however  could also consider contributing leg swelling, anxiety, PAD, and knee osteoarthritis.  MRI lumbar in 2017 showing evidence of spinal stenosis, pending evaluation tomorrow, 1/13, will consider repeating MRI to rule out spinal stenosis as etiology.  In the interim, encouraged elevating her legs with compression stockings, Tylenol, and Voltaren gel for her OA.    Scheduled with myself for 1/13 at 1:50 PM.  Discussed ED precautions if worsening weakness, bowel/bladder incontinence, or saddle anesthesia.  Time spent during visit with patient: 16 minutes  Rebecca Clan, DO

## 2021-01-01 ENCOUNTER — Other Ambulatory Visit: Payer: Self-pay

## 2021-01-01 ENCOUNTER — Ambulatory Visit: Payer: Medicare Other | Admitting: Family Medicine

## 2021-01-01 VITALS — BP 142/64 | HR 77 | Wt 201.4 lb

## 2021-01-01 DIAGNOSIS — G959 Disease of spinal cord, unspecified: Secondary | ICD-10-CM | POA: Diagnosis not present

## 2021-01-01 DIAGNOSIS — R29898 Other symptoms and signs involving the musculoskeletal system: Secondary | ICD-10-CM

## 2021-01-01 DIAGNOSIS — R200 Anesthesia of skin: Secondary | ICD-10-CM

## 2021-01-01 DIAGNOSIS — M4807 Spinal stenosis, lumbosacral region: Secondary | ICD-10-CM | POA: Diagnosis not present

## 2021-01-01 DIAGNOSIS — R9089 Other abnormal findings on diagnostic imaging of central nervous system: Secondary | ICD-10-CM

## 2021-01-01 NOTE — Patient Instructions (Signed)
Wonderful to see you!   The numbness is likely from diabetic neuropathy. Continue to wear your compression stockings, elevate your legs, and try to stay active as you can. You can increase your gabapentin nightly to 2-3 tablets (start with 2 to see how you handle this) to help with this discomfort. Cont voltaren gel on your knees.   For your back, we are going to get you scheduled for MRI of low and middle back. Barbera Setters will call to get this scheduled.   If you have sudden weakness, worsening numbness, or changes in bowel/bladder please go to the ED/call us.

## 2021-01-01 NOTE — Addendum Note (Signed)
Addended by: Owens Shark, Cheral Cappucci on: 01/01/2021 08:54 AM   Modules accepted: Level of Service

## 2021-01-01 NOTE — Progress Notes (Signed)
SUBJECTIVE:   CHIEF COMPLAINT / HPI: Bilateral leg numbness/fatigue   Ms. Pennie is a 80 year old female presenting for evaluation of worsening bilateral leg numbness/perceived weakness over the past week. Discussed over the phone yesterday, 1/13, and recommended in person evaluation.   Today, reports that her legs are feeling better, the sensation seems to improve throughout the day.  She notices the numbness in her legs improves whenever she takes her insulin.  Reports her numbness will start in her toes and go up to her knees bilaterally.  She also reports a lot of knee discomfort with her known osteoarthritis due to the weather changes.  In reference to her thighs, she states has been quite sometime that they will feel fatigued with activity, better at rest. Seen in 11/2016 by Dr. Marlou Sa and Dr. Lorin Mercy due to high grade stenosis at L4/L5, discussed surgical intervention and recommended following up to discuss further however she did not follow-up.  Discussed epidural injection, however opted against this at that time due to her diabetes, however her diabetes has been very well controlled for quite some time now.  PERTINENT  PMH / PSH: CHF, chronic low back pain, hypertension, OSA, GERD,severe anxiety, type 2 diabetes, history of hyponatremia, restless leg syndrome, insomnia  OBJECTIVE:   BP (!) 142/64   Pulse 77   Wt 201 lb 6.4 oz (91.4 kg)   LMP  (LMP Unknown)   SpO2 94%   BMI 38.05 kg/m   General: Alert, NAD HEENT: NCAT, MMM Lungs: No increased WOB  Ext: Warm, dry, 2+ DP pulses bilaterally, 1+ pitting edema to mid shin bilaterally MSK: No significant deformity, rash, or knee effusion present.  Palpation throughout bilateral lower extremity nontender with the exception of some lumbar paraspinal musculature tenderness.  Sensation to light touch intact throughout including soles of bilateral feet.  5/5 bilateral lower extremity strength.  Gait normal with assistance of  cane.  Diabetic Foot Exam - Simple   Simple Foot Form Diabetic Foot exam was performed with the following findings: Yes 01/01/2021 12:27 PM  Visual Inspection No deformities, no ulcerations, no other skin breakdown bilaterally: Yes Sensation Testing Intact to touch and monofilament testing bilaterally: Yes Pulse Check Posterior Tibialis and Dorsalis pulse intact bilaterally: Yes Comments       ASSESSMENT/PLAN:   Bilateral leg numbness Bilateral lower legs/feet only, suspect likely related to diabetic neuropathy, reassuringly neurologically intact today.  However, she also has atypical features, do believe that anxiety may also play a role.  No significant LE edema on exam above her baseline.  Already using gabapentin, recommended increasing to 200-300 nightly to see if this impacts her morning symptoms.  Continue diabetic control.  Leg fatigue Bilateral thighs, with activity and standing, for the past several years without any increase in severity.  Appears consistent with neurogenic claudication especially given her known history of high-grade stenosis around L4-L5.  No s/sx consistent with cauda equina.  Doubt PAD, warm/dry extremities with palpable pulses.  Previously evaluated for epidural injection/surgical intervention, however did not follow-up.  Will obtain updated MRI lumbar spine to assess and consider reevaluation for injection as this would likely improve her functional status.  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  Radiologist suspected benign etiology, however recommended MRI thoracic spine that ultimately was not obtained.  Will add on MRI thoracic in addition to lumbar as above to assess.    Follow up in 2-3 weeks to  check in or sooner if needed.    [name], [place]

## 2021-01-03 ENCOUNTER — Encounter: Payer: Self-pay | Admitting: Family Medicine

## 2021-01-03 DIAGNOSIS — R29898 Other symptoms and signs involving the musculoskeletal system: Secondary | ICD-10-CM

## 2021-01-03 DIAGNOSIS — R9089 Other abnormal findings on diagnostic imaging of central nervous system: Secondary | ICD-10-CM | POA: Insufficient documentation

## 2021-01-03 HISTORY — DX: Other symptoms and signs involving the musculoskeletal system: R29.898

## 2021-01-03 NOTE — Assessment & Plan Note (Signed)
Bilateral lower legs/feet only, suspect likely related to diabetic neuropathy, reassuringly neurologically intact today.  However, she also has atypical features, do believe that anxiety may also play a role.  No significant LE edema on exam above her baseline.  Already using gabapentin, recommended increasing to 200-300 nightly to see if this impacts her morning symptoms.  Continue diabetic control.

## 2021-01-03 NOTE — Assessment & Plan Note (Signed)
5 mm intrathecal mass around T11 present on MRI lumbar spine 2017.  Radiologist suspected benign etiology, however recommended MRI thoracic spine that ultimately was not obtained.  Will add on MRI thoracic in addition to lumbar as above to assess.

## 2021-01-03 NOTE — Assessment & Plan Note (Addendum)
Bilateral thighs, with activity and standing, for the past several years without any increase in severity.  Appears consistent with neurogenic claudication especially given her known history of high-grade stenosis around L4-L5.  No s/sx consistent with cauda equina.  Doubt PAD, warm/dry extremities with palpable pulses.  Previously evaluated for epidural injection/surgical intervention, however did not follow-up.  Will obtain updated MRI lumbar spine to assess and consider reevaluation for injection as this would likely improve her functional status.

## 2021-01-08 ENCOUNTER — Telehealth: Payer: Self-pay

## 2021-01-08 NOTE — Telephone Encounter (Signed)
Called [facility] to schedule MRI's. They will call patient in order to ask her screening questions and to determine what machine they need to use.  Ozella Almond, [place]

## 2021-01-14 ENCOUNTER — Other Ambulatory Visit: Payer: Self-pay | Admitting: Family Medicine

## 2021-01-14 ENCOUNTER — Ambulatory Visit: Payer: Medicare Other | Admitting: Family Medicine

## 2021-01-14 ENCOUNTER — Telehealth: Payer: Self-pay | Admitting: Family Medicine

## 2021-01-14 NOTE — Telephone Encounter (Signed)
Patient is calling to reschedule the appointment she cancelled for today. She would like to know if Dr. Higinio Plan could please call her today when she gets a chance.   The best call back is [phone]

## 2021-01-14 NOTE — Telephone Encounter (Signed)
I unfortunately will likely not be able to call her until Friday. Does she have any specific concern?   [name], DO

## 2021-01-16 ENCOUNTER — Encounter: Payer: Self-pay | Admitting: Podiatry

## 2021-01-16 ENCOUNTER — Ambulatory Visit: Payer: Medicare Other | Admitting: Podiatry

## 2021-01-16 ENCOUNTER — Telehealth: Payer: Self-pay | Admitting: Family Medicine

## 2021-01-16 ENCOUNTER — Other Ambulatory Visit: Payer: Self-pay

## 2021-01-16 DIAGNOSIS — Z794 Long term (current) use of insulin: Secondary | ICD-10-CM

## 2021-01-16 DIAGNOSIS — M79674 Pain in right toe(s): Secondary | ICD-10-CM

## 2021-01-16 DIAGNOSIS — B351 Tinea unguium: Secondary | ICD-10-CM | POA: Diagnosis not present

## 2021-01-16 DIAGNOSIS — R197 Diarrhea, unspecified: Secondary | ICD-10-CM | POA: Insufficient documentation

## 2021-01-16 DIAGNOSIS — M2041 Other hammer toe(s) (acquired), right foot: Secondary | ICD-10-CM

## 2021-01-16 DIAGNOSIS — M2042 Other hammer toe(s) (acquired), left foot: Secondary | ICD-10-CM

## 2021-01-16 DIAGNOSIS — E1143 Type 2 diabetes mellitus with diabetic autonomic (poly)neuropathy: Secondary | ICD-10-CM

## 2021-01-16 DIAGNOSIS — M79675 Pain in left toe(s): Secondary | ICD-10-CM | POA: Diagnosis not present

## 2021-01-16 HISTORY — DX: Diarrhea, unspecified: R19.7

## 2021-01-16 NOTE — Telephone Encounter (Signed)
Call patient discuss concerns.  Reports that she is doing well and just want to talk.  See that she did schedule her MRI for 2/16, has follow-up with myself on 2/11.  [name], DO

## 2021-01-18 NOTE — Progress Notes (Signed)
Subjective: Rebecca Walter presents today for preventative diabetic foot care and painful thick toenails that are difficult to trim. Pain interferes with ambulation. Aggravating factors include wearing enclosed shoe gear. Pain is relieved with periodic professional debridement.   She states her blood glucose was 90 mg/dl this morning. She voices no new pedal concerns on today's visit.  Past Medical History:  Diagnosis Date  . Anxiety   . Arthritis   . Candidiasis of skin 02/14/2020  . CARCINOMA, BASAL CELL 05/26/2007   Qualifier: History of  By: Barbra Sarks MD, Apolonio Schneiders    . Chronic diastolic CHF (congestive heart failure) (HCC)    Echocardiogram 01/2020: EF 70-75, no RWMA, Gr 1 DD, normal RVSF, RVSP 45.8, mild to mod LAE, trivial MR, mild PI  . Diabetes mellitus   . Epistaxis 02/04/2020  . Generalized weakness 12/15/2018  . GERD (gastroesophageal reflux disease)   . Gout   . History of nuclear stress test    Myoview 06/2020: EF 75, no ischemia or infarction, low risk  . Hyperlipidemia   . Hypertension   . Hypertensive heart disease with CHF (congestive heart failure) ([place])   . Hyponatremia   . Mild aortic stenosis 06/2017  . Mild mitral regurgitation 06/2017  . Moderate pulmonary valve insufficiency 06/2017  . OBESITY, NOS 02/16/2007   Qualifier: Diagnosis of  By: Drucie Ip    . Osteopenia     Patient Active Problem List   Diagnosis Date Noted  . Diarrhea of presumed infectious origin 01/16/2021  . Leg fatigue 01/03/2021  . Abnormal MRI, spinal cord 01/03/2021  . Bilateral leg numbness 12/31/2020  . Carpal tunnel syndrome of right wrist 11/26/2020  . Early satiety 09/04/2020  . Laryngopharyngeal reflux (LPR) 06/19/2020  . Frequent falls 04/17/2020  . Restless leg syndrome 01/23/2020  . Obstructive sleep apnea 12/04/2019  . Hoarseness 11/27/2019  . Diabetic autonomic neuropathy ([place]) 01/24/2019  . Chronically dry eyes, right 12/15/2018  . Hypertension 10/17/2018  .  Elevated TSH 11/21/2017  . Chronic diastolic CHF (congestive heart failure) ([place]) 07/15/2017  . Mild aortic stenosis 06/19/2017  . Mild mitral regurgitation 06/19/2017  . Moderate pulmonary valve insufficiency 06/19/2017  . Lumbar back pain with radiculopathy affecting right lower extremity 10/29/2016  . Vertigo 08/29/2013  . Allergic rhinitis 03/22/2011  . INSOMNIA 09/16/2009  . GASTROPARESIS 08/12/2009  . Anxiety 10/18/2008  . DYSKINESIA, ESOPHAGUS 09/13/2007  . Diabetes mellitus, type II ([place]) 02/16/2007  . HLD (hyperlipidemia) 02/16/2007  . GASTROESOPHAGEAL REFLUX, NO ESOPHAGITIS 02/16/2007  . Osteoarthritis of both knees 02/16/2007  . Osteopenia after menopause 02/16/2007  . Gout 02/16/2007    Past Surgical History:  Procedure Laterality Date  . KIDNEY SURGERY    . KNEE SURGERY    . MENISECTOMY    . WRIST SURGERY      Current Outpatient Medications on File Prior to Visit  Medication Sig Dispense Refill  . liver oil-zinc oxide (DESITIN) 40 % ointment See admin instructions.    Marland Kitchen acetaminophen (TYLENOL) 325 MG tablet Take 325 mg by mouth every 8 (eight) hours as needed (pain).     Marland Kitchen allopurinol (ZYLOPRIM) 100 MG tablet Take 2 tablets (200 mg total) by mouth daily. 180 tablet 2  . allopurinol (ZYLOPRIM) 100 MG tablet 2 tablets    . aluminum-magnesium hydroxide-simethicone (MAALOX) 008-676-19 MG/5ML SUSP Take 15 mLs by mouth 2 (two) times daily as needed. 500 mL 0  . amLODipine (NORVASC) 10 MG tablet Take 1 tablet (10 mg total) by mouth daily. [place]  tablet 3  . amLODipine (NORVASC) 10 MG tablet 1 tablet    . amLODipine (NORVASC) 2.5 MG tablet TAKE 1 TABLET 2 (TWO) TIMES DAILY AS NEEDED (FOR ELEVATED BLOOD PRESSURE SYSTOLIC >785 ONLY). 180 tablet 1  . aspirin (ASPIRIN 81) 81 MG chewable tablet 1 tablet    . aspirin 81 MG chewable tablet Chew 1 tablet (81 mg total) by mouth daily. 30 tablet 5  . B-D ULTRAFINE III SHORT PEN 31G X 8 MM MISC USE TO INJECT INSULIN 4 TIMES DAILY OR AS  DIRECTED. 100 each 5  . baclofen (LIORESAL) 10 MG tablet TAKE 1 TABLET BY MOUTH TWICE A DAY 180 tablet 0  . Blood Glucose Monitoring Suppl (ACCU-CHEK AVIVA PLUS) w/Device KIT 1 Device by Does not apply route daily. 1 kit 3  . Blood Glucose Monitoring Suppl (ACCU-CHEK AVIVA PLUS) w/Device KIT See admin instructions.    . busPIRone (BUSPAR) 15 MG tablet TAKE 1 TABLET BY MOUTH 2 TIMES DAILY. 180 tablet 1  . busPIRone (BUSPAR) 15 MG tablet 1 tablet    . Camphor-Eucalyptus-Menthol (VICKS VAPORUB) 4.73-1.2-2.6 % OINT See admin instructions.    . candesartan (ATACAND) 32 MG tablet 1 tablet    . carvedilol (COREG) 3.125 MG tablet 1 tablet    . clotrimazole (LOTRIMIN) 1 % cream 1 application    . colchicine (COLCRYS) 0.6 MG tablet 1 tablet    . diclofenac Sodium (VOLTAREN) 1 % GEL Apply 4 g topically 4 (four) times daily as needed. 100 g 3  . Elastic Bandages & Supports (WRIST SPLINT/COCK-UP/RIGHT SM) MISC Please place on right wrist nightly and during day as needed. 1 each 0  . famotidine (PEPCID) 20 MG tablet Take 20 mg by mouth daily.    . famotidine (PEPCID) 20 MG tablet 1 tablet at bedtime as needed    . gabapentin (NEURONTIN) 100 MG capsule TAKE 1 CAPSULE BY MOUTH AT BEDTIME. 30 capsule 0  . glucose blood (ACCU-CHEK AVIVA PLUS) test strip CHECK BLOOD SUGAR FIRST  THING IN THE MORNING BEFORE EATING, AFTER LUNCH, AND  AFTER DINNER  TOTAL 3 TIMES DAILY 300 strip 3  . glucose blood test strip Use to check blood sugars    . hydrocortisone 2.5 % cream Apply topically 2 (two) times daily. 30 g 0  . Ibuprofen (ADVIL) 200 MG CAPS 1 capsule    . insulin aspart (NOVOLOG FLEXPEN) 100 UNIT/ML FlexPen Inject 9-18 Units into the skin See admin instructions. Inject 15 units subcutaneously daily with breakfast, inject 9 units with lunch as needed for CBG 180 or more, and inject 9 units with supper 15 mL 3  . insulin glargine (LANTUS SOLOSTAR) 100 UNIT/ML Solostar Pen Inject 60 Units into the skin daily. 30 mL 2  .  Insulin Pen Needle (BD ULTRA-FINE PEN NEEDLES) 29G X 12.7MM MISC     . ketorolac (ACULAR) 0.5 % ophthalmic solution Place 1 drop into both eyes 4 (four) times daily.     Marland Kitchen lidocaine (XYLOCAINE) 2 % solution SMARTSIG:By Mouth    . loperamide (IMODIUM A-D) 2 MG tablet 1 tablet as needed    . loratadine (CLARITIN) 10 MG tablet Take 10 mg by mouth daily.    Marland Kitchen loratadine (CLARITIN) 10 MG tablet 1 tablet    . lovastatin (ALTOPREV) 60 MG 24 hr tablet Take 1 tablet (60 mg total) by mouth at bedtime. (Patient taking differently: Take 40 mg by mouth at bedtime. ) 90 tablet 3  . lovastatin (MEVACOR) 40 MG tablet 1  tablet with a meal    . metFORMIN (GLUCOPHAGE-XR) 500 MG 24 hr tablet Take 500 mg by mouth daily with breakfast.    . metFORMIN (GLUCOPHAGE-XR) 500 MG 24 hr tablet 1 tablet    . moxifloxacin (VIGAMOX) 0.5 % ophthalmic solution Place 1 drop into both eyes 3 (three) times daily.     . Multiple Vitamin (M.V.I. ADULT IV) 1 tab    . Multiple Vitamin (MULTI VITAMIN) TABS 1 tablet    . Multiple Vitamin (MULTIVITAMIN WITH MINERALS) TABS tablet Take 1 tablet by mouth daily.    . pantoprazole (PROTONIX) 40 MG tablet Take 1 tablet (40 mg total) by mouth 2 (two) times daily. 180 tablet 1  . prednisoLONE acetate (PRED FORTE) 1 % ophthalmic suspension Place 1 drop into both eyes as directed.     . Probiotic Product (PROBIOTIC BLEND PO) 1 capsule    . ramelteon (ROZEREM) 8 MG tablet Take 0.5 tablets (4 mg total) by mouth at bedtime. 30 tablet 0  . rOPINIRole (REQUIP) 0.25 MG tablet 1 tablet 1 to 3 hours before bedtime    . sertraline (ZOLOFT) 100 MG tablet TAKE 1 TABLET BY MOUTH EVERY DAY 90 tablet 4  . sertraline (ZOLOFT) 50 MG tablet TAKE 1 TABLET IN THE AFTERNOON, ONE DOSE AT BEDTIME 180 tablet 2  . sodium chloride (OCEAN NASAL SPRAY) 0.65 % nasal spray 2 sprays in each nostril as needed    . spironolactone (ALDACTONE) 25 MG tablet Take 1 tablet (25 mg total) by mouth daily. 90 tablet 3  . torsemide  (DEMADEX) 10 MG tablet TAKE 2 TABLETS (20 MG TOTAL) BY MOUTH DAILY AS NEEDED (EDEMA, >3LB WEIGHT GAIN). 60 tablet 2  . traZODone (DESYREL) 50 MG tablet     . valsartan (DIOVAN) 320 MG tablet Take 1 tablet (320 mg total) by mouth daily. 90 tablet 2  . Vitamin E 400 units TABS 1 tablet     No current facility-administered medications on file prior to visit.     Allergies  Allergen Reactions  . Baclofen Other (See Comments)    Caused leg weakness Other reaction(s): caused leg weakness  . Hydrochlorothiazide Other (See Comments)    Uric acid elevation and gout.    Other reaction(s): uric acid elevation and gout  . Liraglutide Other (See Comments)    Tongue Glossitus Other reaction(s): tongue glossitus  . Lotensin [Benazepril] Other (See Comments)    Dry cough   . Acyclovir     Other reaction(s): diarrhea  . Losartan Potassium     Other reaction(s): diarrhea  . Acyclovir And Related Diarrhea  . Beta Adrenergic Blockers Other (See Comments)    Occurred with metoprolol  REACTION: coughing  . Losartan Diarrhea  . Other Diarrhea and Other (See Comments)    Occurred with metoprolol  REACTION: coughing Other reaction(s): dry cough Other reaction(s): coughing    Social History   Occupational History  . Occupation: retired-office work  Tobacco Use  . Smoking status: Never Smoker  . Smokeless tobacco: Never Used  Vaping Use  . Vaping Use: Never used  Substance and Sexual Activity  . Alcohol use: No  . Drug use: No  . Sexual activity: Not Currently    Family History  Problem Relation Age of Onset  . Stroke Mother   . Heart disease Father   . Stroke Father   . Hypertension Father   . Cancer Sister   . Cancer Brother   . Heart disease Brother   .  Diabetes Maternal Uncle     Immunization History  Administered Date(s) Administered  . Influenza Split 11/05/2011, 09/20/2012  . Influenza Whole 11/15/2007, 10/24/2009, 09/25/2010  . Influenza, High Dose Seasonal PF  09/07/2018  . Influenza,inj,Quad PF,6+ Mos 10/08/2014, 10/10/2015, 09/26/2017, 12/03/2020  . Influenza-Unspecified 10/19/2013, 10/03/2016, 10/26/2019  . PFIZER(Purple Top)SARS-COV-2 Vaccination 02/22/2020, 03/25/2020, 10/09/2020  . Pneumococcal Conjugate-13 09/26/2017  . Pneumococcal Polysaccharide-23 10/20/2004  . Td 10/20/2002  . Tdap 11/21/2017     Objective: There were no vitals filed for this visit.  Rebecca Walter is a pleasant 80 y.o. Caucasian female  IN NAD. AAO X 3.  Capillary refill time to digits immediate b/l. Palpable DP pulses b/l. Palpable PT pulses b/l. Pedal hair present b/l. Skin temperature gradient within normal limits b/l.  Pedal skin with normal turgor, texture and tone bilaterally. No open wounds bilaterally. No interdigital macerations bilaterally. Toenails 1-5 b/l elongated, dystrophic, thickened, crumbly with subungual debris and tenderness to dorsal palpation.  Normal muscle strength 5/5 to all lower extremity muscle groups bilaterally, no pain crepitus or joint limitation noted with ROM b/l and hammertoes noted to the  L 2nd toe and R 2nd toe.  Protective sensation intact 5/5 intact bilaterally with 10g monofilament b/l Vibratory sensation intact b/l Proprioception intact bilaterally.  Hemoglobin A1C Latest Ref Rng & Units 10/09/2020 05/20/2020  HGBA1C 4.0 - 5.6 % 6.3(A) 6.5  Some recent data might be hidden   Assessment: 1. Pain due to onychomycosis of toenails of both feet   2. Acquired hammertoes of both feet   3. Type 2 diabetes mellitus with diabetic autonomic neuropathy, with long-term current use of insulin ([place])     Plan: -Examined patient. -No new findings. No new orders. -Continue diabetic foot care principles. -Toenails 1-5 b/l were debrided in length and girth with sterile nail nippers and dremel without iatrogenic bleeding.  -Patient to report any pedal injuries to medical professional immediately. -Patient to continue soft, supportive  shoe gear daily. -Patient/POA to call should there be question/concern in the interim.  Return in about 3 months (around 04/16/2021).

## 2021-01-30 ENCOUNTER — Other Ambulatory Visit: Payer: Self-pay

## 2021-01-30 ENCOUNTER — Ambulatory Visit: Payer: Medicare Other | Admitting: Family Medicine

## 2021-01-30 ENCOUNTER — Encounter: Payer: Self-pay | Admitting: Family Medicine

## 2021-01-30 VITALS — BP 136/78 | HR 82 | Wt 201.2 lb

## 2021-01-30 DIAGNOSIS — M8580 Other specified disorders of bone density and structure, unspecified site: Secondary | ICD-10-CM

## 2021-01-30 DIAGNOSIS — E1143 Type 2 diabetes mellitus with diabetic autonomic (poly)neuropathy: Secondary | ICD-10-CM | POA: Diagnosis not present

## 2021-01-30 DIAGNOSIS — Z794 Long term (current) use of insulin: Secondary | ICD-10-CM

## 2021-01-30 DIAGNOSIS — Z23 Encounter for immunization: Secondary | ICD-10-CM

## 2021-01-30 DIAGNOSIS — Z85820 Personal history of malignant melanoma of skin: Secondary | ICD-10-CM

## 2021-01-30 DIAGNOSIS — R9089 Other abnormal findings on diagnostic imaging of central nervous system: Secondary | ICD-10-CM

## 2021-01-30 DIAGNOSIS — Z780 Asymptomatic menopausal state: Secondary | ICD-10-CM

## 2021-01-30 LAB — POCT GLYCOSYLATED HEMOGLOBIN (HGB A1C): Hemoglobin A1C: 6.6 % — AB (ref 4.0–5.6)

## 2021-01-30 NOTE — Progress Notes (Signed)
    SUBJECTIVE:   CHIEF COMPLAINT / HPI: Follow-up  Ms. Downen is an 80 year old female presenting to check in.  She reports she is overall doing well.  She would like a skin lesion on her upper back evaluated today as it started bleeding a little bit yesterday morning.  She previously had skin melanoma removed from here by Dr. Allyson Sabal, dermatology.  She reports a history of melanoma in several different areas of her body including leg, face, and upper shoulders.  States she has been doing well in terms of diabetic control.  Still taking Lantus 60 units with NovoLog sliding scale.  Denies any hypoglycemic symptoms or CBGs under 70.  Believes she had a recent diabetic eye exam by Dr. Manuella Ghazi.   PERTINENT  PMH / PSH: CHF, chronic low back pain, hypertension, OSA, GERD,severe anxiety, type 2 diabetes, history of hyponatremia, restless leg syndrome, insomnia  OBJECTIVE:   BP 136/78   Pulse 82   Wt 201 lb 3.2 oz (91.3 kg)   LMP  (LMP Unknown)   SpO2 95%   BMI 38.02 kg/m   General: Alert, NAD HEENT: NCAT, MMM Cardiac: RRR  Lungs: Clear bilaterally, no increased WOB  Msk: Moves all extremities spontaneously, gait with rolling walker   Ext: Warm, dry, 2+ distal pulses Derm: Lesion present on left upper shoulder. Approximately 5.5 x 4 cm.      ASSESSMENT/PLAN:   History of melanoma Reports several locations of previously removed melanoma.  Additionally evaluated area of previous melanoma present on left upper shoulder, unclear as to what this region look like after removal however current appearance is very suspicious for recurrent melanoma. No s/sx of infection. Recommended scheduling appointment with her dermatologist ASAP, provided contact information.  Call our office if needs new referral as she has not seen them in a few years.  Diabetes mellitus, type II ([place]) Well-controlled, A1c 6.6.  No episodes of hypoglycemia.  Will maintain Lantus 60U and NovoLog sliding scale as  is.  Need for shingles vaccine Shingles vaccine prescription sent.  Previous history of mild shingles per patient.  Abnormal MRI, spinal cord Upcoming MRI lumbar/thoracic spine on 2/16.  Will follow results.  Osteopenia after menopause Rediscussed repeating DEXA scan, last in 2009 showing osteopenia.  She would like to wait until MRIs as above.  Discussed again on follow-up.    Follow-up in approximately 1 month, or sooner per patient's preference.  Can be virtual.  [name], [place]

## 2021-01-30 NOTE — Patient Instructions (Signed)
[facility]:  [address] [place], [place] [address] [phone]  Continue to consider the bone density scan in the future.

## 2021-01-31 ENCOUNTER — Encounter: Payer: Self-pay | Admitting: Family Medicine

## 2021-01-31 DIAGNOSIS — Z85820 Personal history of malignant melanoma of skin: Secondary | ICD-10-CM

## 2021-01-31 DIAGNOSIS — Z23 Encounter for immunization: Secondary | ICD-10-CM | POA: Insufficient documentation

## 2021-01-31 HISTORY — DX: Personal history of malignant melanoma of skin: Z85.820

## 2021-01-31 MED ORDER — ZOSTER VAC RECOMB ADJUVANTED 50 MCG/0.5ML IM SUSR: 0.5000 mL | Freq: Once | INTRAMUSCULAR | 0 refills | Status: AC

## 2021-01-31 NOTE — Assessment & Plan Note (Signed)
Reports several locations of previously removed melanoma.  Additionally evaluated area of previous melanoma present on left upper shoulder, unclear as to what this region look like after removal however current appearance is very suspicious for recurrent melanoma. No s/sx of infection. Recommended scheduling appointment with her dermatologist ASAP, provided contact information.  Call our office if needs new referral as she has not seen them in a few years.

## 2021-01-31 NOTE — Assessment & Plan Note (Signed)
Well-controlled, A1c 6.6.  No episodes of hypoglycemia.  Will maintain Lantus 60U and NovoLog sliding scale as is.

## 2021-01-31 NOTE — Assessment & Plan Note (Signed)
Rediscussed repeating DEXA scan, last in 2009 showing osteopenia.  She would like to wait until MRIs as above.  Discussed again on follow-up.

## 2021-01-31 NOTE — Assessment & Plan Note (Signed)
Upcoming MRI lumbar/thoracic spine on 2/16.  Will follow results.

## 2021-01-31 NOTE — Assessment & Plan Note (Signed)
Shingles vaccine prescription sent.  Previous history of mild shingles per patient.

## 2021-02-03 ENCOUNTER — Other Ambulatory Visit: Payer: Self-pay

## 2021-02-03 MED ORDER — ACCU-CHEK AVIVA PLUS VI STRP: ORAL_STRIP | 3 refills | Status: DC

## 2021-02-04 ENCOUNTER — Other Ambulatory Visit: Payer: Medicare Other

## 2021-02-06 ENCOUNTER — Telehealth (INDEPENDENT_AMBULATORY_CARE_PROVIDER_SITE_OTHER): Payer: Medicare Other | Admitting: Family Medicine

## 2021-02-06 ENCOUNTER — Other Ambulatory Visit: Payer: Self-pay

## 2021-02-06 DIAGNOSIS — F419 Anxiety disorder, unspecified: Secondary | ICD-10-CM

## 2021-02-06 NOTE — Progress Notes (Signed)
[facility] Telemedicine Visit  Patient consented to have virtual visit and was identified by name and date of birth. Method of visit: Telephone  Encounter participants: Patient: Rebecca Walter - located at home Provider: [name] - located at [facility] Others (if applicable): None  Chief Complaint: Check in   HPI: Rebecca Walter is an 80 year old female presenting via phone to check-in.  She reports she has had a rough day, states she has gotten herself worked up and overstressed due to troubles with losing her Internet/phone connection.  Feeling anxious.  After several attempts she finally got it fixed through the provider on Wednesday.  Feels like her body is hurting all over and nauseous.  Having some upper back pain.  Deep breaths with prayer last night seem to make it better, but it flared back up today.  Denies difficulty breathing/chest pain.  She called her dermatologist and got an appointment for early/mid March to follow-up on her back.  States that was the earliest appointment they had available.  ROS: per HPI  Pertinent PMHx: CHF,chronic low back pain,hypertension, OSA, GERD,severe anxiety, type 2 diabetes, history of hyponatremia, restless leg syndrome, insomnia  Exam:  LMP  (LMP Unknown)   Respiratory: Normal work of breathing, speaking in full sentences  Assessment/Plan:  Severe anxiety: Recent stressors causing increased stress today.  During our appointment time discussion, recommended repeating her breathing techniques and placing a heating pad on her back and that I would call back after clinic to check in.  Called back approximately 1.5 hours later to check-in, she reports she is feeling better. Continued to talk and provided supportive listening/relaxation which she endorsed significant reduction in her anxiety.  Recommended continued heating pad as needed, breathing techniques, speaking with her son for comfort.  Follow-up in  approximately 2 weeks to check in for patient preference or sooner if needed.  ED precautions discussed including any difficulty breathing or chest pain.  Time spent during visit with patient: 30 minutes  [name], DO

## 2021-02-07 ENCOUNTER — Encounter: Payer: Self-pay | Admitting: Family Medicine

## 2021-02-17 ENCOUNTER — Other Ambulatory Visit: Payer: Self-pay | Admitting: Family Medicine

## 2021-02-18 ENCOUNTER — Telehealth (INDEPENDENT_AMBULATORY_CARE_PROVIDER_SITE_OTHER): Payer: Medicare Other | Admitting: Family Medicine

## 2021-02-18 ENCOUNTER — Encounter: Payer: Self-pay | Admitting: Family Medicine

## 2021-02-18 DIAGNOSIS — R197 Diarrhea, unspecified: Secondary | ICD-10-CM

## 2021-02-18 NOTE — Progress Notes (Signed)
[facility] Telemedicine Visit  Patient consented to have virtual visit and was identified by name and date of birth. Method of visit: Telephone  Encounter participants: Patient: Rebecca Walter - located at home Provider: [name] - located at [facility] Others (if applicable):  Chief Complaint: stomach issue  HPI: [name] is an 80 year old female presenting via phone to discuss recent stomach concerns that have since resolved.    She reports over the weekend she started experiencing brown diarrhea.  She reports she was having several episodes a day and started taking Imodium.  Fortunately, reports that she has not had any diarrhea since Monday, 2/28.  She started feeling fatigued with this, however now that she is started drinking more water she is feeling better.  Denied any associated fever, abdominal pain, vomiting, or nausea.  She denies any new foods or medications.  Her CBGs have been at baseline around 100-200 and taking her medications as prescribed.  She has been in the low bit more stress than usual and states that she will often have stomach upset with this.  Remainder of discussion revolving around her anxiety and general life updates.  ROS: per HPI  Pertinent PMHx: CHF,chronic low back pain,hypertension, OSA, GERD,severe anxiety, type 2 diabetes, history of hyponatremia, restless leg syndrome, insomnia  Exam:  LMP  (LMP Unknown)   Respiratory: No increased WOB  Assessment/Plan:  Acute nonbloody diarrhea: Resolved. May have been due to a mild viral enteritis vs functionally related to her anxiety, likely combination of the two.  Recommended continued adequate hydration and monitoring for any further symptoms.  Does not need to continue with Imodium.  Follow-up scheduled during appointment for 3/14 at 4:10 PM, sooner if needed.  Time spent during visit with patient: 21 minutes  [name], DO

## 2021-02-20 ENCOUNTER — Telehealth: Payer: Medicare Other | Admitting: Family Medicine

## 2021-02-26 ENCOUNTER — Other Ambulatory Visit: Payer: Medicare Other

## 2021-03-02 ENCOUNTER — Other Ambulatory Visit: Payer: Self-pay

## 2021-03-02 ENCOUNTER — Encounter: Payer: Self-pay | Admitting: Family Medicine

## 2021-03-02 ENCOUNTER — Telehealth: Payer: Self-pay | Admitting: *Deleted

## 2021-03-02 ENCOUNTER — Telehealth (INDEPENDENT_AMBULATORY_CARE_PROVIDER_SITE_OTHER): Payer: Medicare Other | Admitting: Family Medicine

## 2021-03-02 VITALS — BP 152/64 | HR 74

## 2021-03-02 DIAGNOSIS — R9089 Other abnormal findings on diagnostic imaging of central nervous system: Secondary | ICD-10-CM | POA: Diagnosis not present

## 2021-03-02 DIAGNOSIS — C4491 Basal cell carcinoma of skin, unspecified: Secondary | ICD-10-CM | POA: Insufficient documentation

## 2021-03-02 HISTORY — DX: Basal cell carcinoma of skin, unspecified: C44.91

## 2021-03-02 NOTE — Telephone Encounter (Signed)
Tried to contact pt to let her know that the doctor was running behind. Lendell Gallick Zimmerman Rumple, CMA

## 2021-03-02 NOTE — Assessment & Plan Note (Signed)
Present on her left shoulder and forearm, awaiting surgical removal by dermatology.

## 2021-03-02 NOTE — Progress Notes (Signed)
[facility] Telemedicine Visit  Patient consented to have virtual visit and was identified by name and date of birth. Method of visit: Telephone  Encounter participants: Patient: Rebecca Walter - located at Home Provider: Patriciaann Walter - located at [facility] Others (if applicable): None   Chief Complaint: Check in   HPI: [name] is a 80 year old female presenting via phone to check in.   She saw dermatology last week for her skin lesion. Biopsy of that area showed [place]. She also had several other small regions on forearm/back/hand that were BCC and SCC. Awaiting call from surgical center to schedule removal.    She also notes that she has to reschedule her MRI thoracic/lumbar spine.  On the morning that she planned to go for these images, she was feeling weak and under the weather.  Back to her baseline now, feels like she had increased anxiety to get these completed.  She has been a call the imaging center tomorrow to get this rescheduled.  Remainder of discussion in reference to her anxiety and general life updates.  ROS: per HPI  Pertinent PMHx: CHF,chronic low back pain,hypertension, OSA, GERD,severe anxiety, type 2 diabetes, history of hyponatremia, restless leg syndrome, insomnia  Exam:  BP (!) 152/64   Pulse 74   LMP  (LMP Unknown)   Respiratory: Normal WOB   Assessment/Plan:  Basal cell carcinoma Present on her left shoulder and forearm, awaiting surgical removal by dermatology.  Abnormal MRI, spinal cord Instructed to call the imaging center tomorrow to reschedule both MRI lumbar and thoracic spine.  Will Rx low-dose Ativan prior to images given severe anxiety, instructed to call our office when she has rescheduled so that I can send medicine at appropriate timing.    Time spent during visit with patient: 20 minutes  Will likely check in with her in the next several weeks or sooner if needed.  Rebecca Clan, DO

## 2021-03-02 NOTE — Assessment & Plan Note (Signed)
Instructed to call the imaging center tomorrow to reschedule both MRI lumbar and thoracic spine.  Will Rx low-dose Ativan prior to images given severe anxiety, instructed to call our office when she has rescheduled so that I can send medicine at appropriate timing.

## 2021-03-04 ENCOUNTER — Other Ambulatory Visit: Payer: Self-pay | Admitting: Family Medicine

## 2021-03-23 ENCOUNTER — Telehealth: Payer: Self-pay | Admitting: Family Medicine

## 2021-03-23 NOTE — Telephone Encounter (Signed)
Patient has appointment ([facility]) on :Friday but is saying she is really needing to talk to doctor. She just had surgery on her hand and is having a few more surgeries. She is wanting doctor to call her. Please advise. Thanks!

## 2021-03-24 NOTE — Telephone Encounter (Signed)
It is unlikely that I will be able to call her today or tomorrow, but will try to call her prior to her appointment on Friday.  [name], DO

## 2021-03-26 ENCOUNTER — Other Ambulatory Visit: Payer: Self-pay | Admitting: Family Medicine

## 2021-03-27 ENCOUNTER — Encounter: Payer: Self-pay | Admitting: Family Medicine

## 2021-03-27 ENCOUNTER — Telehealth (INDEPENDENT_AMBULATORY_CARE_PROVIDER_SITE_OTHER): Payer: Medicare Other | Admitting: Family Medicine

## 2021-03-27 DIAGNOSIS — C4491 Basal cell carcinoma of skin, unspecified: Secondary | ICD-10-CM | POA: Diagnosis not present

## 2021-03-27 DIAGNOSIS — R1319 Other dysphagia: Secondary | ICD-10-CM

## 2021-03-27 NOTE — Progress Notes (Signed)
[place] Telemedicine Visit  Patient consented to have virtual visit and was identified by name and date of birth. Method of visit: Telephone  Encounter participants: Patient: Rebecca Walter - located at Home  Provider: [name] - located at [facility]  Others (if applicable): None   Chief Complaint: "Check in"   HPI: Ms. Keeble is an 80 yo female presenting via phone to check in.  SCC on the hand removed last Friday by her dermatologist. Martin Majestic well.  Scheduled to get the [facility] removed from her left on 4/18, then the other [facility] on her forearm 5/1.  She was not anxious prior to procedure, however felt anxious afterwards due to the pain in her forearm when the numbing wore off.  She used ibuprofen/Tylenol every 3 hours which helped some.  She is hopeful to have a few tablets of Ativan to calm her nerves after her surgery, especially she has to coming up.  Additionally she endorses her known esophageal dysphagia/globus sensation seems to be worse recently.  She is already on Pepcid, Protonix, and a GI cocktail as needed which she does not feel like mages much of a difference.  Sometimes feels like food gets stuck and then has to take a little bit before it comes out.  No difficulty initiating swallowing with liquids or solids.  Does notice this sensation is worse when she is feeling anxious, but can happen when she is not anxious as well.  Last saw her GI physician in 2021, follows with [facility].   ROS: per HPI  Pertinent PMHx: CHF,chronic low back pain,hypertension, OSA, GERD,severe anxiety, type 2 diabetes, history of hyponatremia, restless leg syndrome, insomnia  Exam:  LMP  (LMP Unknown)   Respiratory: normal WOB   Assessment/Plan:  Esophageal dysphagia Chronic, worsening.  Esophageal study in 06/2019 showing moderate/severe esophageal dysmotility in the setting of reflux and possible mild peptic stricture.  Already on full dose Pepcid and Protonix without  much improvement.  Suspect multifactorial with abnormalities as above and contributing anxiety.  Recommended scheduling follow-up with her GI physician at [facility] to assess if repeat studies need to be performed given progression.    Basal cell carcinoma Upcoming skin lesion removal surgeries on 4/18 and 5/1.  Scheduled appointment with myself on 4/20 to assess how she is handling postoperative pain.  Discussed that I am extremely hesitant to restart Ativan for anxiety control, however will assess if there is an alternative medication that can be used on a brief period for her increased anxiety surrounding surgical procedures.   Follow-up 4/20 at 11:15 AM virtually or sooner if needed.  Time spent during visit with patient: 40 minutes  [name], DO

## 2021-03-27 NOTE — Assessment & Plan Note (Signed)
Upcoming skin lesion removal surgeries on 4/18 and 5/1.  Scheduled appointment with myself on 4/20 to assess how she is handling postoperative pain.  Discussed that I am extremely hesitant to restart Ativan for anxiety control, however will assess if there is an alternative medication that can be used on a brief period for her increased anxiety surrounding surgical procedures.

## 2021-03-27 NOTE — Assessment & Plan Note (Signed)
Chronic, worsening.  Esophageal study in 06/2019 showing moderate/severe esophageal dysmotility in the setting of reflux and possible mild peptic stricture.  Already on full dose Pepcid and Protonix without much improvement.  Suspect multifactorial with abnormalities as above and contributing anxiety.  Recommended scheduling follow-up with her GI physician at [facility] to assess if repeat studies need to be performed given progression.

## 2021-03-30 ENCOUNTER — Other Ambulatory Visit: Payer: Self-pay | Admitting: Gastroenterology

## 2021-03-30 DIAGNOSIS — R1310 Dysphagia, unspecified: Secondary | ICD-10-CM

## 2021-04-01 ENCOUNTER — Other Ambulatory Visit: Payer: Self-pay | Admitting: Family Medicine

## 2021-04-08 ENCOUNTER — Telehealth (INDEPENDENT_AMBULATORY_CARE_PROVIDER_SITE_OTHER): Payer: Medicare Other | Admitting: Family Medicine

## 2021-04-08 ENCOUNTER — Encounter: Payer: Self-pay | Admitting: Family Medicine

## 2021-04-08 ENCOUNTER — Other Ambulatory Visit: Payer: Self-pay

## 2021-04-08 VITALS — BP 150/71 | HR 60 | Ht 60.0 in

## 2021-04-08 DIAGNOSIS — C4491 Basal cell carcinoma of skin, unspecified: Secondary | ICD-10-CM

## 2021-04-08 DIAGNOSIS — F419 Anxiety disorder, unspecified: Secondary | ICD-10-CM | POA: Diagnosis not present

## 2021-04-08 DIAGNOSIS — R1319 Other dysphagia: Secondary | ICD-10-CM | POA: Diagnosis not present

## 2021-04-08 NOTE — Progress Notes (Signed)
[place] Telemedicine Visit  Patient consented to have virtual visit and was identified by name and date of birth. Method of visit: Telephone  Encounter participants: Patient: Rebecca Walter - located at home  Provider: [name] - located at [facility] Others (if applicable): None   Chief Complaint: Check in   HPI: [name] is an 80 year old female presenting via telephone to check-in.  She reports that her [place] skin removal surgery on her left shoulder went well on Monday, 4/18.  She has a bandage in place and will take off in 7 days per the recommendations of her dermatologist.  Pain is well controlled, alternating Tylenol and ibuprofen as needed.  She has her next surgery for the [facility] on her upper forearm scheduled for 5/2.  She also reached out to her GI specialist for her dysphagia.  Has a DG esophagus scheduled for 4/29.  They restarted her Reglan and probiotic.  She reports her symptoms are already much better, thinks the Reglan is helping.  Overall feels like her anxiety is doing okay.  Feels less anxious after the surgery compared to the first.   ROS: per HPI  Pertinent PMHx: CHF,chronic low back pain,hypertension, OSA, GERD,severe anxiety, type 2 diabetes, history of hyponatremia, restless leg syndrome, insomnia  Exam:  BP (!) 150/71   Pulse 60   Ht 5' (1.524 m)   LMP  (LMP Unknown)   BMI 39.29 kg/m   Respiratory: Normal WOB   Assessment/Plan:  Esophageal dysphagia Significantly improved with Reglan, likely component of diabetic gastroparesis.  Following with GI, DG esophagus scheduled for 4/29 for further evaluation.  Basal cell carcinoma Recent removal on 4/18 went well.  Next scheduled for 5/2 to remove [facility] on her left forearm via her dermatologist.  Anxiety Severe with reasonable control currently.  Continue Zoloft/BuSpar as is.  She previously had requested restarting Ativan intermittently for her nerves after surgery, however  feel the risks would outweigh the benefit.  Discussed trial of milk thistle vs ashwagandha intermittently as needed.    Next virtual appointment scheduled with Dr. Jeani Hawking on 4/29, then with myself on 5/9.  Time spent during visit with patient: 35 minutes  [name], DO

## 2021-04-08 NOTE — Assessment & Plan Note (Signed)
Recent removal on 4/18 went well.  Next scheduled for 5/2 to remove [facility] on her left forearm via her dermatologist.

## 2021-04-08 NOTE — Assessment & Plan Note (Signed)
Severe with reasonable control currently.  Continue Zoloft/BuSpar as is.  She previously had requested restarting Ativan intermittently for her nerves after surgery, however feel the risks would outweigh the benefit.  Discussed trial of milk thistle vs ashwagandha intermittently as needed.

## 2021-04-08 NOTE — Assessment & Plan Note (Signed)
Significantly improved with Reglan, likely component of diabetic gastroparesis.  Following with GI, DG esophagus scheduled for 4/29 for further evaluation.

## 2021-04-10 ENCOUNTER — Telehealth: Payer: Self-pay

## 2021-04-10 NOTE — Telephone Encounter (Signed)
Patient calls nurse line regarding abnormal blood pressure readings. Patient reports that BP has gotten up to 233 systolic and diastolic in the 00T.   Most recent BP was 156/70. Patient denies headache, blurry vision or chest pain. Patient reports compliance with medications.   Provided with ED precautions.   Please advise any additional recommendations.

## 2021-04-12 NOTE — Patient Instructions (Signed)
It was wonderful to meet you today. Thank you for allowing me to be a part of your care. Below is a short summary of what we discussed at your visit today:  Basal Cell Carcinoma  I am glad to hear your surgical site from the cancer removal is healing well. Please follow up with the surgeon for suture removal.   Dysphagia Your esophageal imaging with [facility] this morning demonstrated a small hiatal hernia, a very small amount of esophageal reflux, stomach, and poor esophageal motility.  You will need to follow-up with your GI doctor as they will be the specialists to help with the plan going forward for this.  Continue to do a great job of taking in smaller food portions and chewing food very finely.  This will reduce your chance of choking or having that sensation of a lump in your throat.  Anxiety It sounds like you are managing the anxiety very well with the buspirone and sertraline as is.  Based on what you telling, it seems that you are doing everything at home that you want to be doing and are not inhibited by anxiety.  Please bring all of your medications to every appointment!  If you have any questions or concerns, please do not hesitate to contact us via phone or MyChart message.   [name], MD

## 2021-04-12 NOTE — Progress Notes (Signed)
    Virtual Visit via Video Note I connected with Anairis C Gladson on 04/17/21 at 5:15pm via telephone and verified that I am speaking with the correct person using two identifiers.  Location: Patient: [name], located at home Provider: Dr. [name], located at [place]   History of Present Illness: [name] is a pleasant 80 year old woman with past medical history of CHF,chronic low back pain,hypertension, OSA, GERD,severe anxiety, type 2 diabetes, history of hyponatremia, restless leg syndrome, insomnia.   Discussed her recent removal of basal cell carcinoma. Believes the surgical site is clean and without infection. No concerns. It is hard for her to change the bandage because of its location. She believes her son will come by soon to help her change it and keep it clean. Due for appointment next week to have stitches out.   Dysphagia is improving. Has a method for smaller food boluses and chewing food finer. These have resulted in improvements and less choking.  Reports choking on a jolly rancher about 3 weeks ago; was worried that it led to damage, as she felt it was still stuck. Has been eating and drinking normally since then without an increase in choking.   Anxiety is doing "okay". Believes this regimen is working well for her. Current regimen "is maxed out". When asked about being able to complete ADLs without being impeded by anxiety, she reports she has a lot of help around the house but is able to do what she wants around the house. Has an aide that comes in MWF, another that comes in Thursdays. Daughter helps with the laundry. Patient doesn't do much cleaning. She does do some cooking for breakfast and dinner. Denies negative side effects such as GI upset and excessive grogginess or sleepiness.   Observations/Objective: Speaking in full sentences No respiratory distress No coughing, hoarseness of voice  Assessment and Plan:  Basal  cell carcinoma Doing well s/p removal. Due for another appointment to have stiches removed next week.   Dysphagia DG esophagus today with [facility]. Demonstrated a small hiatal hernia with trivia esophageal reflux, no stricture, poor esophageal motility, and large osteophyte at C5-6 indenting the proximal esophagus. Will defer to GI for management. Recommend continued small boluses with finely chewed food.   Anxiety Currently on buspirone 15 mg, sertraline 100 mg. Doing well on this regimen, no changes at this time.    Follow Up Instructions: Virtual visit Monday 04/27/2021 at 4:10pm with Dr. Darrelyn Hillock.   I discussed the assessment and treatment plan with the patient. The patient was provided an opportunity to ask questions and all were answered. The patient agreed with the plan and demonstrated an understanding of the instructions.   The patient was advised to call back or seek an in-person evaluation if the symptoms worsen or if the condition fails to improve as anticipated.  I provided 35 minutes of non-face-to-face time during this encounter.   [name], MD

## 2021-04-13 NOTE — Telephone Encounter (Signed)
Informed patient of advice from Dr. Higinio Plan.  Patient verbalized understanding.  Rebecca Walter, [place]

## 2021-04-13 NOTE — Telephone Encounter (Signed)
Her blood pressure usually is in the 275'T systolic. Please have her continue intermittently monitoring at home and can discuss in appointment later this week if she remains asymptomatic. No medication changes for now.   [name], DO

## 2021-04-17 ENCOUNTER — Telehealth: Payer: Self-pay | Admitting: Family Medicine

## 2021-04-17 ENCOUNTER — Telehealth (INDEPENDENT_AMBULATORY_CARE_PROVIDER_SITE_OTHER): Payer: Medicare Other | Admitting: Family Medicine

## 2021-04-17 ENCOUNTER — Encounter: Payer: Self-pay | Admitting: Family Medicine

## 2021-04-17 ENCOUNTER — Ambulatory Visit
Admission: RE | Admit: 2021-04-17 | Discharge: 2021-04-17 | Disposition: A | Discharge status: Home / Self Care | Payer: Medicare Other | Source: Ambulatory Visit | Attending: Gastroenterology | Admitting: Gastroenterology

## 2021-04-17 DIAGNOSIS — R1310 Dysphagia, unspecified: Secondary | ICD-10-CM

## 2021-04-17 DIAGNOSIS — R1319 Other dysphagia: Secondary | ICD-10-CM

## 2021-04-17 DIAGNOSIS — I10 Essential (primary) hypertension: Secondary | ICD-10-CM | POA: Diagnosis not present

## 2021-04-17 NOTE — Addendum Note (Signed)
Addended by: Owens Shark, Chassity Ludke on: 04/17/2021 07:38 PM   Modules accepted: Level of Service

## 2021-04-17 NOTE — Telephone Encounter (Signed)
Running behind schedule today. RN called Ms. Zarling closer to 4:15pm to let her know I would be calling later than originally planned.   Attempting to call Ms. Saia now. Left VM.   Will try again in about 5 minutes.   [name], MD

## 2021-04-21 NOTE — Progress Notes (Signed)
This visit was conducted via telephone only.   [name], MD

## 2021-04-27 ENCOUNTER — Encounter: Payer: Self-pay | Admitting: Family Medicine

## 2021-04-27 ENCOUNTER — Telehealth (INDEPENDENT_AMBULATORY_CARE_PROVIDER_SITE_OTHER): Payer: Medicare Other | Admitting: Family Medicine

## 2021-04-27 ENCOUNTER — Other Ambulatory Visit: Payer: Self-pay

## 2021-04-27 DIAGNOSIS — F419 Anxiety disorder, unspecified: Secondary | ICD-10-CM

## 2021-04-27 DIAGNOSIS — C4491 Basal cell carcinoma of skin, unspecified: Secondary | ICD-10-CM

## 2021-04-27 DIAGNOSIS — R1319 Other dysphagia: Secondary | ICD-10-CM

## 2021-04-27 NOTE — Assessment & Plan Note (Signed)
Symptomatically improved currently.  Poor esophageal motility noted on recent esophagus study.  Following up with GI next week.

## 2021-04-27 NOTE — Assessment & Plan Note (Signed)
Severe with reasonable control currently.  Will continue with frequent phone visits to check-in.  Continue Zoloft/BuSpar as prescribed.

## 2021-04-27 NOTE — Assessment & Plan Note (Signed)
Most recent forearm lesion removed on 5/2.  No further surgical procedures planned and has tolerated all removals well.  Most regions continue to heal, she has been doing local wound care with the help of her home health RN.  She will maintain follow-up with her dermatologist yearly or sooner if needed.

## 2021-04-27 NOTE — Progress Notes (Signed)
[place] Telemedicine Visit  Patient consented to have virtual visit and was identified by name and date of birth. Method of visit: Telephone  Encounter participants: Patient: Rebecca Walter - located at home  Provider: [name] - located at [facility]   Chief Complaint: Check in    HPI:  Ms. Weems is an 80 year old female presenting via telephone to check-in.  She recently had a conversation with Dr. Jeani Hawking and enjoyed getting to know her.  She most recently had her last skin cancer removal surgery on 5/2, this was her final surgery planned and removed the [facility] on her left forearm.  Reports this went extremely well and is already healing without concern.  The one on her upper left shoulder is continuing to take a slow time to heal, however is improving every day.  She has been using Vaseline with the help of her home health RN in this area.  She additionally reports that her dysphagia has been doing much better than recent times.  She does not have any difficulty with pills, got into a good routine of drinking plenty of water with pills.  She is looking forward to following up with her GI specialist in the next few weeks to talk about her recent esophagus study.  ROS: per HPI  Pertinent PMHx: CHF,chronic low back pain,hypertension, OSA, GERD,severe anxiety, type 2 diabetes, history of hyponatremia, restless leg syndrome, insomnia  Exam:  LMP  (LMP Unknown)   Respiratory: Speaking in full sentences   Assessment/Plan:  Basal cell carcinoma Most recent forearm lesion removed on 5/2.  No further surgical procedures planned and has tolerated all removals well.  Most regions continue to heal, she has been doing local wound care with the help of her home health RN.  She will maintain follow-up with her dermatologist yearly or sooner if needed.  Anxiety Severe with reasonable control currently.  Will continue with frequent phone visits to check-in.  Continue  Zoloft/BuSpar as prescribed.  Esophageal dysphagia Symptomatically improved currently.  Poor esophageal motility noted on recent esophagus study.  Following up with GI next week.    Scheduled next appointment with myself on 6/3, follow-up sooner if needed.  Time spent during visit with patient: 29 minutes  [name], DO

## 2021-04-29 ENCOUNTER — Other Ambulatory Visit: Payer: Self-pay

## 2021-04-29 ENCOUNTER — Encounter: Payer: Self-pay | Admitting: Podiatry

## 2021-04-29 ENCOUNTER — Ambulatory Visit: Payer: Medicare Other | Admitting: Podiatry

## 2021-04-29 DIAGNOSIS — E1143 Type 2 diabetes mellitus with diabetic autonomic (poly)neuropathy: Secondary | ICD-10-CM | POA: Diagnosis not present

## 2021-04-29 DIAGNOSIS — Z794 Long term (current) use of insulin: Secondary | ICD-10-CM | POA: Diagnosis not present

## 2021-04-29 DIAGNOSIS — M79674 Pain in right toe(s): Secondary | ICD-10-CM | POA: Diagnosis not present

## 2021-04-29 DIAGNOSIS — M2041 Other hammer toe(s) (acquired), right foot: Secondary | ICD-10-CM

## 2021-04-29 DIAGNOSIS — M79675 Pain in left toe(s): Secondary | ICD-10-CM

## 2021-04-29 DIAGNOSIS — B351 Tinea unguium: Secondary | ICD-10-CM

## 2021-04-29 DIAGNOSIS — L84 Corns and callosities: Secondary | ICD-10-CM

## 2021-04-29 DIAGNOSIS — M2042 Other hammer toe(s) (acquired), left foot: Secondary | ICD-10-CM

## 2021-05-05 NOTE — Progress Notes (Signed)
Subjective:  Patient ID: Rebecca Walter, female    DOB: 02-19-1941,  MRN: [id number]  80 y.o. female presents at risk foot care with history of diabetic neuropathy and painful thick toenails that are difficult to trim. Pain interferes with ambulation. Aggravating factors include wearing enclosed shoe gear. Pain is relieved with periodic professional debridement.   Her blood glucose was 173 mg/dl today.  She voices no new pedal concerns on today's visit. She is requesting toe cap for one of her toes.  PCP is Dr. Darrelyn Hillock and last visit was 04/27/2021.  Allergies  Allergen Reactions  . Baclofen Other (See Comments)    Caused leg weakness Other reaction(s): caused leg weakness Other reaction(s): caused leg weakness  . Hydrochlorothiazide Other (See Comments)    Uric acid elevation and gout.    Other reaction(s): uric acid elevation and gout Other reaction(s): uric acid elevation and gout  . Liraglutide Other (See Comments)    Tongue Glossitus Other reaction(s): tongue glossitus Other reaction(s): tongue glossitus  . Lotensin [Benazepril] Other (See Comments)    Dry cough   . Acyclovir     Other reaction(s): diarrhea Other reaction(s): diarrhea  . Losartan Potassium     Other reaction(s): diarrhea Other reaction(s): diarrhea  . Beta Adrenergic Blockers Other (See Comments)    Occurred with metoprolol  REACTION: coughing  . Other Diarrhea and Other (See Comments)    Occurred with metoprolol  REACTION: coughing Other reaction(s): dry cough Other reaction(s): coughing Other reaction(s): dry cough    Review of Systems: Negative except as noted in the HPI.   Objective:   Constitutional Pt is a pleasant 80 y.o. Caucasian female morbidly obese in NAD. AAO x 3.   Vascular Capillary refill time to digits immediate b/l. Palpable pedal pulses b/l LE. Pedal hair present. Lower extremity skin temperature gradient within normal limits.  Neurologic Pt has subjective symptoms of  neuropathy. Protective sensation intact 5/5 intact bilaterally with 10g monofilament b/l. Vibratory sensation intact b/l.  Dermatologic Pedal skin with normal turgor, texture and tone bilaterally. No open wounds bilaterally. No interdigital macerations bilaterally. Toenails 1-5 b/l elongated, discolored, dystrophic, thickened, crumbly with subungual debris and tenderness to dorsal palpation. Hyperkeratotic lesion(s) R hallux.  No erythema, no edema, no drainage, no fluctuance.  Orthopedic: Normal muscle strength 5/5 to all lower extremity muscle groups bilaterally. No pain crepitus or joint limitation noted with ROM b/l. Hammertoes noted to the L 2nd toe and R 2nd toe.   Radiographs: None Assessment:   1. Pain due to onychomycosis of toenails of both feet   2. Acquired hammertoes of both feet   3. Callus   4. Type 2 diabetes mellitus with diabetic autonomic neuropathy, with long-term current use of insulin ([place])    Plan:  Patient was evaluated and treated and all questions answered.  Onychomycosis with pain -Nails palliatively debridement as below. -Educated on self-care  Procedure: Nail Debridement Rationale: Pain Type of Debridement: manual, sharp debridement. Instrumentation: Nail nipper, rotary burr. Number of Nails: 10  -Examined patient. -Continue diabetic foot care principles. -Patient to continue soft, supportive shoe gear daily. -Toenails 1-5 b/l were debrided in length and girth with sterile nail nippers and dremel without iatrogenic bleeding.  -Callus(es) R hallux pared utilizing sterile scalpel blade without complication or incident. Total number debrided =1. -Patient to report any pedal injuries to medical professional immediately. -Patient/POA to call should there be question/concern in the interim.  Return in about 3 months (around 07/30/2021).  Stephani Police  Elisha Ponder, DPM

## 2021-05-11 ENCOUNTER — Other Ambulatory Visit: Payer: Self-pay | Admitting: Family Medicine

## 2021-05-11 DIAGNOSIS — I10 Essential (primary) hypertension: Secondary | ICD-10-CM

## 2021-05-11 DIAGNOSIS — F419 Anxiety disorder, unspecified: Secondary | ICD-10-CM

## 2021-05-22 ENCOUNTER — Telehealth (INDEPENDENT_AMBULATORY_CARE_PROVIDER_SITE_OTHER): Payer: Medicare Other | Admitting: Family Medicine

## 2021-05-22 ENCOUNTER — Encounter: Payer: Self-pay | Admitting: Family Medicine

## 2021-05-22 ENCOUNTER — Other Ambulatory Visit: Payer: Self-pay

## 2021-05-22 DIAGNOSIS — F419 Anxiety disorder, unspecified: Secondary | ICD-10-CM

## 2021-05-22 DIAGNOSIS — C4491 Basal cell carcinoma of skin, unspecified: Secondary | ICD-10-CM

## 2021-05-22 NOTE — Progress Notes (Signed)
[place] Telemedicine Visit  Patient consented to have virtual visit and was identified by name and date of birth. Method of visit: Telephone  Encounter participants: Patient: Rebecca Walter - located at Home Provider: [name] - located at [facility] Others (if applicable): none   Chief Complaint: check in   HPI: [name] is a an 80 year old female presenting via telephone to check-in.  She reports she overall is doing well, still little tired after the numerous surgeries she has had for her skin cancers.  Feels like her upper back skin is so tight after having the large BCC removed, feels like she cannot do as much because of this.  No concerns today otherwise, no she needs to follow-up in the office for her A1c here soon.  Glucose has been reasonable at home.  ROS: per HPI  Pertinent PMHx: CHF,chronic low back pain,hypertension, OSA, GERD,severe anxiety, type 2 diabetes, history of hyponatremia, restless leg syndrome, insomnia  Exam:  LMP  (LMP Unknown)   Respiratory: Speaking in full sentences without concern  Assessment/Plan:  Basal cell carcinoma In 3 separate locations, required 3 dermatologic outpatient procedures for removal, last on 5/2.  Encouraged her to slowly stretch her back throughout the day to ensure the skin does not stay consistently tight.  Anxiety Severe with reasonable control currently.  Managed with frequent phone visits to check-in and talk.  Continue Zoloft/BuSpar as prescribed.    Virtual follow-up scheduled with myself on 6/13 and then in the office on 6/21 for A1c.  Time spent during visit with patient: 25 minutes  [name], DO

## 2021-05-22 NOTE — Assessment & Plan Note (Signed)
Severe with reasonable control currently.  Managed with frequent phone visits to check-in and talk.  Continue Zoloft/BuSpar as prescribed.

## 2021-05-22 NOTE — Assessment & Plan Note (Signed)
In 3 separate locations, required 3 dermatologic outpatient procedures for removal, last on 5/2.  Encouraged her to slowly stretch her back throughout the day to ensure the skin does not stay consistently tight.

## 2021-05-27 LAB — HM DIABETES EYE EXAM

## 2021-06-01 ENCOUNTER — Other Ambulatory Visit: Payer: Self-pay

## 2021-06-01 ENCOUNTER — Telehealth (INDEPENDENT_AMBULATORY_CARE_PROVIDER_SITE_OTHER): Payer: Medicare Other | Admitting: Family Medicine

## 2021-06-01 ENCOUNTER — Encounter: Payer: Self-pay | Admitting: Family Medicine

## 2021-06-01 DIAGNOSIS — R1319 Other dysphagia: Secondary | ICD-10-CM

## 2021-06-01 DIAGNOSIS — E1143 Type 2 diabetes mellitus with diabetic autonomic (poly)neuropathy: Secondary | ICD-10-CM

## 2021-06-01 DIAGNOSIS — Z794 Long term (current) use of insulin: Secondary | ICD-10-CM

## 2021-06-01 NOTE — Progress Notes (Signed)
[place] Telemedicine Visit  Patient consented to have virtual visit and was identified by name and date of birth. Method of visit: Telephone  Encounter participants: Patient: Rebecca Walter - located at home Provider: [name] - located at [facility] Others (if applicable): none  Chief Complaint: Check in  HPI: Rebecca Walter is a an 80 year old female presenting via telephone to check-in.  She reports she overall is doing well, enjoying the summer weather.  She recently was seen by her GI specialist and restarted on metoclopramide.  Feels like her dysphagia is doing better right now.  She recently had her annual eye exam and feels like she has a film covering over her right eye.  Her ophthalmologist is sending her to her cornea specialist as she has previously had surgery on her cornea.  ROS: per HPI  Pertinent PMHx: CHF, chronic low back pain, hypertension, OSA, GERD, severe anxiety, type 2 diabetes, history of hyponatremia, restless leg syndrome, insomnia  Exam:  LMP  (LMP Unknown)   Respiratory: speaking in full sentences without concern  Assessment/Plan:  Esophageal dysphagia Symptomatically improved currently.  Known poor esophageal motility.  Following with GI.  Diabetes mellitus, type II ([place]) Needs repeat A1c on office visit next week.    History of hyponatremia: Will check BMP at office visit next week per patient preference.  Office visit already scheduled for 6/21.  Time spent during visit with patient: 15 minutes   [name], DO

## 2021-06-01 NOTE — Assessment & Plan Note (Signed)
Needs repeat A1c on office visit next week.

## 2021-06-01 NOTE — Assessment & Plan Note (Signed)
Symptomatically improved currently.  Known poor esophageal motility.  Following with GI.

## 2021-06-02 LAB — HM DIABETES EYE EXAM

## 2021-06-03 ENCOUNTER — Encounter: Payer: Self-pay | Admitting: Family Medicine

## 2021-06-09 ENCOUNTER — Ambulatory Visit: Payer: Medicare Other | Admitting: Family Medicine

## 2021-06-09 ENCOUNTER — Other Ambulatory Visit: Payer: Self-pay

## 2021-06-09 ENCOUNTER — Encounter: Payer: Self-pay | Admitting: Family Medicine

## 2021-06-09 ENCOUNTER — Ambulatory Visit (INDEPENDENT_AMBULATORY_CARE_PROVIDER_SITE_OTHER): Payer: Medicare Other

## 2021-06-09 ENCOUNTER — Other Ambulatory Visit: Payer: Self-pay | Admitting: Family Medicine

## 2021-06-09 VITALS — BP 142/60 | HR 84 | Ht 60.0 in | Wt 189.0 lb

## 2021-06-09 DIAGNOSIS — M109 Gout, unspecified: Secondary | ICD-10-CM

## 2021-06-09 DIAGNOSIS — I10 Essential (primary) hypertension: Secondary | ICD-10-CM

## 2021-06-09 DIAGNOSIS — Z794 Long term (current) use of insulin: Secondary | ICD-10-CM | POA: Diagnosis not present

## 2021-06-09 DIAGNOSIS — E1143 Type 2 diabetes mellitus with diabetic autonomic (poly)neuropathy: Secondary | ICD-10-CM

## 2021-06-09 DIAGNOSIS — Z23 Encounter for immunization: Secondary | ICD-10-CM

## 2021-06-09 DIAGNOSIS — E871 Hypo-osmolality and hyponatremia: Secondary | ICD-10-CM

## 2021-06-09 DIAGNOSIS — E785 Hyperlipidemia, unspecified: Secondary | ICD-10-CM

## 2021-06-09 DIAGNOSIS — I340 Nonrheumatic mitral (valve) insufficiency: Secondary | ICD-10-CM

## 2021-06-09 LAB — POCT GLYCOSYLATED HEMOGLOBIN (HGB A1C): HbA1c, POC (controlled diabetic range): 7.3 % — AB (ref 0.0–7.0)

## 2021-06-09 NOTE — Progress Notes (Signed)
    SUBJECTIVE:   CHIEF COMPLAINT / HPI: Check in   Rebecca Walter is a 80 year old female presenting for follow-up.  Reports today she overall is doing well.  Diabetes:  Fasting: usually 150 Before dinner: 130's Lowest she has seen was 70, asymptomatic. No other low sugars like previous.  Taking 60U lantus, Novolog SSI (About 12 with breakfast, sometimes with lunch (a few units), and rarely with dinner).  She has not been taking a statin anymore with previous refill of difficulties in the past.  She would like to recheck her cholesterol before considering restarting.  Would like her covid booster today.  Additionally she wonders if she needs to be on allopurinol 200 mg daily, she has not had a gout flare in several years.  PERTINENT  PMH / PSH: CHF, chronic low back pain, hypertension, OSA, GERD, severe anxiety, type 2 diabetes, history of hyponatremia, restless leg syndrome, insomnia  OBJECTIVE:   BP (!) 142/60   Pulse 84   Ht 5' (1.524 m)   Wt 189 lb (85.7 kg)   LMP  (LMP Unknown)   SpO2 96%   BMI 36.91 kg/m   General: Alert, NAD HEENT: NCAT, MMM Cardiac: RRR, 2/6 systolic murmur heard Lungs: Clear bilaterally, no increased WOB  Msk: Moves all extremities spontaneously  Ext: Warm, dry, 2+radial pulses   ASSESSMENT/PLAN:   Diabetes mellitus, type II (HCC) A1c 7.3% today.  Very pleased with this range, with a goal of around 7-8 for her to encourage overall safety given history of recurrent hypoglycemia on a daily insulin.  Fortunately no hypoglycemic episodes recently, will continue Lantus/NovoLog as is.  HLD (hyperlipidemia) Recheck lipid panel today per patient preference.  Would like to restart a statin if she can tolerate.  Gout No flare for several years.  Will recheck uric acid level to assess if at goal.  Could likely taper down on allopurinol, however will await for labs.  Hyponatremia History of this, most recent BMP 6 months ago with normal sodium.  Recheck BMP per  patient preference.    Follow-up with new PCP in the next 2 weeks or sooner if needed.  [name], [place]

## 2021-06-09 NOTE — Patient Instructions (Signed)
Wonderful to see you!   We will check your kidney function and sodium today, in addition to your uric acid and cholesterol.   Please follow up in 2-3 weeks to meet with PCP.

## 2021-06-10 ENCOUNTER — Encounter: Payer: Self-pay | Admitting: Family Medicine

## 2021-06-10 LAB — LIPID PANEL
Chol/HDL Ratio: 5.9 ratio — ABNORMAL HIGH (ref 0.0–4.4)
Cholesterol, Total: 207 mg/dL — ABNORMAL HIGH (ref 100–199)
HDL: 35 mg/dL — ABNORMAL LOW (ref >39)
LDL Chol Calc (NIH): 110 mg/dL — ABNORMAL HIGH (ref 0–99)
Triglycerides: 362 mg/dL — ABNORMAL HIGH (ref 0–149)
VLDL Cholesterol Cal: 62 mg/dL — ABNORMAL HIGH (ref 5–40)

## 2021-06-10 LAB — BASIC METABOLIC PANEL
BUN/Creatinine Ratio: 28 (ref 12–28)
BUN: 29 mg/dL — ABNORMAL HIGH (ref 8–27)
CO2: 24 mmol/L (ref 20–29)
Calcium: 9.8 mg/dL (ref 8.7–10.3)
Chloride: 79 mmol/L — ABNORMAL LOW (ref 96–106)
Creatinine, Ser: 1.02 mg/dL — ABNORMAL HIGH (ref 0.57–1.00)
Glucose: 148 mg/dL — ABNORMAL HIGH (ref 65–99)
Potassium: 4.2 mmol/L (ref 3.5–5.2)
Sodium: 142 mmol/L (ref 134–144)
eGFR: 55 mL/min/{1.73_m2} — ABNORMAL LOW (ref >59)

## 2021-06-10 LAB — URIC ACID: Uric Acid: 4.5 mg/dL (ref 3.1–7.9)

## 2021-06-10 NOTE — Assessment & Plan Note (Signed)
No flare for several years.  Will recheck uric acid level to assess if at goal.  Could likely taper down on allopurinol, however will await for labs.

## 2021-06-10 NOTE — Assessment & Plan Note (Signed)
History of this, most recent BMP 6 months ago with normal sodium.  Recheck BMP per patient preference.

## 2021-06-10 NOTE — Assessment & Plan Note (Signed)
Recheck lipid panel today per patient preference.  Would like to restart a statin if she can tolerate.

## 2021-06-10 NOTE — Assessment & Plan Note (Signed)
A1c 7.3% today.  Very pleased with this range, with a goal of around 7-8 for her to encourage overall safety given history of recurrent hypoglycemia on a daily insulin.  Fortunately no hypoglycemic episodes recently, will continue Lantus/NovoLog as is.

## 2021-06-13 ENCOUNTER — Other Ambulatory Visit: Payer: Self-pay | Admitting: Family Medicine

## 2021-06-18 ENCOUNTER — Other Ambulatory Visit: Payer: Self-pay | Admitting: Family Medicine

## 2021-06-18 DIAGNOSIS — E785 Hyperlipidemia, unspecified: Secondary | ICD-10-CM

## 2021-06-18 DIAGNOSIS — E1169 Type 2 diabetes mellitus with other specified complication: Secondary | ICD-10-CM

## 2021-06-18 MED ORDER — LOVASTATIN 40 MG PO TABS: 40.0000 mg | ORAL_TABLET | Freq: Every day | ORAL | 2 refills | Status: DC

## 2021-06-29 NOTE — Patient Instructions (Signed)
It was wonderful to meet you today. Thank you for allowing me to be a part of your care. Below is a short summary of what we discussed at your visit today:  Sleep  - Use good sleep hygiene to wind down before bed  - No screen time for one hour before bed  - Take one Remalteon tab about 30 minutes before bed  - You may also take the gabapentin before bed (but not with the ramelteon at first until you know how it affects you)  - Because your zoloft makes you sleepy, use the schedule below: -- morning: 50 mg -- afternoon: 50 mg -- bedtime 100 mg  Refill - I have sent your pen needle refill to the pharmacy  Dizziness - Keep doing your vertigo exercises as needed  Please bring all of your medications to every appointment!  If you have any questions or concerns, please do not hesitate to contact us via phone or MyChart message.   [name], MD

## 2021-06-30 ENCOUNTER — Other Ambulatory Visit: Payer: Self-pay

## 2021-06-30 ENCOUNTER — Encounter: Payer: Self-pay | Admitting: Family Medicine

## 2021-06-30 ENCOUNTER — Ambulatory Visit: Payer: Medicare Other | Admitting: Family Medicine

## 2021-06-30 VITALS — BP 138/60 | HR 83 | Ht 60.0 in | Wt 195.0 lb

## 2021-06-30 DIAGNOSIS — Z794 Long term (current) use of insulin: Secondary | ICD-10-CM

## 2021-06-30 DIAGNOSIS — E1143 Type 2 diabetes mellitus with diabetic autonomic (poly)neuropathy: Secondary | ICD-10-CM

## 2021-06-30 DIAGNOSIS — R42 Dizziness and giddiness: Secondary | ICD-10-CM

## 2021-06-30 DIAGNOSIS — G4700 Insomnia, unspecified: Secondary | ICD-10-CM

## 2021-06-30 MED ORDER — BD PEN NEEDLE SHORT U/F 31G X 8 MM MISC: 5 refills | Status: DC

## 2021-06-30 MED ORDER — RAMELTEON 8 MG PO TABS: 4.0000 mg | ORAL_TABLET | Freq: Every day | ORAL | 0 refills | Status: DC

## 2021-06-30 NOTE — Assessment & Plan Note (Signed)
Gabapentin nightly helpful "once in a while".  Melatonin without relief.  Does not believe she has tried ramelteon before.  Believes her Zoloft makes her sleepy in the morning when dosed 100 mg a.m., 50 mg mid afternoon, 50 mg nightly. - Retrial ramelteon nightly - Change Zoloft schedule to 50 mg a.m., 50 mg p.m., 100 mg nightly - Encourage sleep hygiene with no screens for 1 hour before bed - May continue to use gabapentin as prescribed

## 2021-06-30 NOTE — Progress Notes (Signed)
    SUBJECTIVE:   CHIEF COMPLAINT / HPI:   Disrupted sleep - Reports it takes her an hour or so to fall asleep, wakes up feeling quite tired - Has been told about sleep hygiene, however still watches TV in bedroom sometimes and then lays there after try to go to sleep - Has been prescribed gabapentin 100 mg nightly; however does not believe this does too much, takes once in a while - Melatonin without relief - Does not believe she has tried ramelteon before  Dizziness - History of vertigo - Woke up Thursday feeling dizzy, felt as though she may fall over - Resolved with Epley maneuvers  PERTINENT  PMH / PSH: CHF, hypertension, OSA, GERD, T2DM, arthritis, osteopenia, history of melanoma   OBJECTIVE:   BP 138/60   Pulse 83   Ht 5' (1.524 m)   Wt 195 lb (88.5 kg)   LMP  (LMP Unknown)   SpO2 93%   BMI 38.08 kg/m   PHQ-9:  Depression screen [facility] 2/9 06/09/2021 03/02/2021 01/30/2021  Decreased Interest 0 0 0  Down, Depressed, Hopeless 0 0 0  PHQ - 2 Score 0 0 0  Altered sleeping 3 - 3  Tired, decreased energy 1 - 1  Change in appetite 0 - 0  Feeling bad or failure about yourself  0 - 0  Trouble concentrating 0 - 0  Moving slowly or fidgety/restless 2 - 1  Suicidal thoughts 0 - 0  PHQ-9 Score 6 - 5  Difficult doing work/chores - - Not difficult at all  Some recent data might be hidden     GAD-7:  GAD 7 : Generalized Anxiety Score 05/20/2020 05/16/2018 04/24/2018 05/25/2016  Nervous, Anxious, on Edge 1 1 1  0  Control/stop worrying 0 1 1 1   Worry too much - different things 1 1 1 1   Trouble relaxing 1 2 2 1   Restless 0 0 0 0  Easily annoyed or irritable 0 1 1 0  Afraid - awful might happen 0 0 0 0  Total GAD 7 Score 3 6 6 3   Anxiety Difficulty Not difficult at all Not difficult at all Not difficult at all Somewhat difficult   Physical Exam General: Awake, alert, oriented, no acute distress Respiratory: Unlabored respirations, speaking in full sentences, no respiratory  distress Extremities: Moving all extremities spontaneously Neuro: Cranial nerves II through X grossly intact Psych: Normal insight and judgement  ASSESSMENT/PLAN:   INSOMNIA Gabapentin nightly helpful "once in a while".  Melatonin without relief.  Does not believe she has tried ramelteon before.  Believes her Zoloft makes her sleepy in the morning when dosed 100 mg a.m., 50 mg mid afternoon, 50 mg nightly. - Retrial ramelteon nightly - Change Zoloft schedule to 50 mg a.m., 50 mg p.m., 100 mg nightly - Encourage sleep hygiene with no screens for 1 hour before bed - May continue to use gabapentin as prescribed  Vertigo Episode last Thursday 7/7, resolved with Epley maneuver.  Encouraged Epley maneuver as needed.    [name], MD [place]

## 2021-06-30 NOTE — Assessment & Plan Note (Signed)
Episode last Thursday 7/7, resolved with Epley maneuver.  Encouraged Epley maneuver as needed.

## 2021-07-01 ENCOUNTER — Other Ambulatory Visit: Payer: Self-pay | Admitting: Family Medicine

## 2021-07-01 DIAGNOSIS — E878 Other disorders of electrolyte and fluid balance, not elsewhere classified: Secondary | ICD-10-CM

## 2021-07-01 DIAGNOSIS — E1169 Type 2 diabetes mellitus with other specified complication: Secondary | ICD-10-CM

## 2021-07-01 NOTE — Progress Notes (Signed)
BMP to check for resolution of hypochloremia without acidosis found on labs 06/09/21.   [name], MD

## 2021-07-14 ENCOUNTER — Other Ambulatory Visit: Payer: Medicare Other

## 2021-07-15 ENCOUNTER — Other Ambulatory Visit: Payer: Self-pay | Admitting: Family Medicine

## 2021-07-15 DIAGNOSIS — E118 Type 2 diabetes mellitus with unspecified complications: Secondary | ICD-10-CM

## 2021-07-15 DIAGNOSIS — Z794 Long term (current) use of insulin: Secondary | ICD-10-CM

## 2021-07-21 ENCOUNTER — Other Ambulatory Visit: Payer: Medicare Other

## 2021-07-21 ENCOUNTER — Other Ambulatory Visit: Payer: Self-pay

## 2021-07-21 DIAGNOSIS — E878 Other disorders of electrolyte and fluid balance, not elsewhere classified: Secondary | ICD-10-CM

## 2021-07-22 LAB — BASIC METABOLIC PANEL
BUN/Creatinine Ratio: 30 — ABNORMAL HIGH (ref 12–28)
BUN: 26 mg/dL (ref 8–27)
CO2: 24 mmol/L (ref 20–29)
Calcium: 9.4 mg/dL (ref 8.7–10.3)
Chloride: 102 mmol/L (ref 96–106)
Creatinine, Ser: 0.88 mg/dL (ref 0.57–1.00)
Glucose: 230 mg/dL — ABNORMAL HIGH (ref 65–99)
Potassium: 4.6 mmol/L (ref 3.5–5.2)
Sodium: 141 mmol/L (ref 134–144)
eGFR: 66 mL/min/{1.73_m2} (ref >59)

## 2021-07-26 ENCOUNTER — Other Ambulatory Visit: Payer: Self-pay | Admitting: Family Medicine

## 2021-07-26 DIAGNOSIS — G4700 Insomnia, unspecified: Secondary | ICD-10-CM

## 2021-07-27 ENCOUNTER — Encounter: Payer: Self-pay | Admitting: Podiatry

## 2021-07-27 ENCOUNTER — Ambulatory Visit: Payer: Medicare Other | Admitting: Podiatry

## 2021-07-27 ENCOUNTER — Other Ambulatory Visit: Payer: Self-pay

## 2021-07-27 DIAGNOSIS — L600 Ingrowing nail: Secondary | ICD-10-CM | POA: Diagnosis not present

## 2021-07-27 DIAGNOSIS — Z794 Long term (current) use of insulin: Secondary | ICD-10-CM

## 2021-07-27 DIAGNOSIS — B351 Tinea unguium: Secondary | ICD-10-CM | POA: Diagnosis not present

## 2021-07-27 DIAGNOSIS — E1143 Type 2 diabetes mellitus with diabetic autonomic (poly)neuropathy: Secondary | ICD-10-CM | POA: Diagnosis not present

## 2021-07-27 DIAGNOSIS — M79674 Pain in right toe(s): Secondary | ICD-10-CM

## 2021-07-27 DIAGNOSIS — M79675 Pain in left toe(s): Secondary | ICD-10-CM

## 2021-07-27 NOTE — Patient Instructions (Signed)
EPSOM SALT FOOT SOAK INSTRUCTIONS  *IF YOU HAVE BEEN PRESCRIBED ANTIBIOTICS, TAKE AS INSTRUCTED UNTIL ALL ARE GONE*  Shopping List:  A. Plain epsom salt (not scented) B. Neosporin Cream C. 1-inch fabric band-aids   Place 1/4 cup of epsom salts in 2 quarts of warm tap water. IF YOU ARE DIABETIC, OR HAVE NEUROPATHY, CHECK THE TEMPERATURE OF THE WATER WITH YOUR ELBOW.   Submerge your foot/feet in the solution and soak for 10-15 minutes.      3.  Next, remove your foot/feet from solution, blot dry the affected area.    4.  Apply light amount of antibiotic cream/ointment and cover with fabric band-aid .  5.  This soak should be done once a day for 7 days.   6.  Monitor for any signs/symptoms of infection such as redness, swelling, odor, drainage, increased pain, or non-healing of digit.   7.  Please do not hesitate to call the office and speak to a Nurse or Doctor if you have questions.   8.  If you experience fever, chills, nightsweats, nausea or vomiting with worsening of digit, please go to the emergency room.

## 2021-08-01 NOTE — Progress Notes (Signed)
Subjective:  Patient ID: Rebecca Walter, female    DOB: 1941-08-14,  MRN: [id number]  Rebecca Walter presents to clinic today for preventative diabetic foot care and painful thick toenails that are difficult to trim. Pain interferes with ambulation. Aggravating factors include wearing enclosed shoe gear. Pain is relieved with periodic professional debridement.  She states her left great toe is sore today. She suspects ingrown toenail. She denies any drainage or swelling, but states it is red at the tip of the digit. Denies any fever, chills, night sweats, nausea or vomiting.  Patient did not check blood glucose today.  PCP is [name], MD , and last visit was 07/01/2021.  Allergies  Allergen Reactions   Baclofen Other (See Comments)    Caused leg weakness Other reaction(s): caused leg weakness Other reaction(s): caused leg weakness   Hydrochlorothiazide Other (See Comments)    Uric acid elevation and gout.    Other reaction(s): uric acid elevation and gout Other reaction(s): uric acid elevation and gout   Liraglutide Other (See Comments)    Tongue Glossitus Other reaction(s): tongue glossitus Other reaction(s): tongue glossitus   Lotensin [Benazepril] Other (See Comments)    Dry cough    Acyclovir     Other reaction(s): diarrhea Other reaction(s): diarrhea   Losartan Potassium     Other reaction(s): diarrhea Other reaction(s): diarrhea   Beta Adrenergic Blockers Other (See Comments)    Occurred with metoprolol  REACTION: coughing   Other Diarrhea and Other (See Comments)    Occurred with metoprolol  REACTION: coughing Other reaction(s): dry cough Other reaction(s): coughing Other reaction(s): dry cough    Review of Systems: Negative except as noted in the HPI. Objective:   Constitutional Rebecca Walter is a pleasant 80 y.o. Caucasian female, obese in NAD. AAO x 3.   Vascular Capillary refill time to digits immediate b/l. Palpable DP pulse(s) b/l lower  extremities Palpable PT pulse(s) b/l lower extremities Pedal hair present. Lower extremity skin temperature gradient within normal limits. No edema noted b/l lower extremities. No cyanosis or clubbing noted.  Neurologic Normal speech. Oriented to person, place, and time. Pt has subjective symptoms of neuropathy. Protective sensation intact 5/5 intact bilaterally with 10g monofilament b/l.  Dermatologic Pedal skin with normal turgor, texture and tone b/l lower extremities. Toenails 2-5 bilaterally and R hallux elongated, discolored, dystrophic, thickened, and crumbly with subungual debris and tenderness to dorsal palpation. Incurvated nailplate medial border(s) L hallux.  Nail border hypertrophy present. There is tenderness to palpation. Sign(s) of infection: no clinical signs of infection noted on examination today.. No hyperkeratotic nor porokeratotic lesions present on today's visit.  Orthopedic: Normal muscle strength 5/5 to all lower extremity muscle groups bilaterally. No pain crepitus or joint limitation noted with ROM b/l lower extremities. Hammertoe(s) noted to the L 2nd toe and R 2nd toe.   Radiographs: None Assessment:   1. Pain due to onychomycosis of toenails of both feet   2. Ingrown toenail without infection   3. Type 2 diabetes mellitus with diabetic autonomic neuropathy, with long-term current use of insulin ([place])    Plan:  -Examined patient. -Patient to continue soft, supportive shoe gear daily. -Toenails 1-5 b/l were debrided in length and girth with sterile nail nippers and dremel without iatrogenic bleeding.  -Offending nail border debrided and curretaged L hallux utilizing sterile nail nipper and currette. Border(s) cleansed with alcohol and triple antibiotic ointment applied. Dispensed written instructions for once daily epsom salt soaks for 7 days. Call  office if condition does not improve. -Patient to report any pedal injuries to medical professional  immediately. -Patient/POA to call should there be question/concern in the interim.  Return in about 3 months (around 10/27/2021).  Marzetta Board, DPM

## 2021-08-05 ENCOUNTER — Ambulatory Visit: Payer: Medicare Other | Admitting: Podiatry

## 2021-08-09 ENCOUNTER — Telehealth: Payer: Self-pay | Admitting: Family Medicine

## 2021-08-09 NOTE — Telephone Encounter (Signed)
**  After Hours/ Emergency Line Call**  Received a call to report that Rebecca Walter was experiencing elevated blood pressures. She believes this is from a GI cocktail that she has been taking. Reports on Thursday her BP was elevated to 99991111 systolic and yesterday was [id number] systolic. She states her blood pressure normally runs in the 150's. She has taken all of her blood pressure medications. She notes associated blurred vision and weakness in her legs. Feels "wobbly". This is new for her. She also is experiencing some shortness of breath but this is apparently chronic for her. Denies chest pain or headache. Recommended that patient present to Emergency Department for further evaluation. She has a son and daughter that live a couple blocks away who are aware of her situation and can drive her. Will forward to PCP.  Sharion Settler, DO PGY-2, [facility] 08/09/2021 3:53 PM

## 2021-08-11 ENCOUNTER — Telehealth: Payer: Self-pay | Admitting: Family Medicine

## 2021-08-11 NOTE — Telephone Encounter (Signed)
Returned call, no answer. Left HIPAA safe VM. After hours line information given.   [name], MD

## 2021-08-11 NOTE — Telephone Encounter (Signed)
Attempted to return patient call as requested by patient. She wanted to speak to PCP regarding blood pressure and upcoming appointment with PCP. No further details provided. No answer, left HIPAA safe VM indicating she should call the after hours emergency line if she has urgent concerns.   [name], MD

## 2021-08-11 NOTE — Telephone Encounter (Signed)
Patient calling regarding her BP and the upcoming in person visit. She request that Dr Jeani Hawking call her when she gets a chance.

## 2021-08-18 ENCOUNTER — Ambulatory Visit: Payer: Medicare Other | Admitting: Podiatry

## 2021-08-21 ENCOUNTER — Other Ambulatory Visit: Payer: Self-pay | Admitting: Family Medicine

## 2021-08-21 DIAGNOSIS — I10 Essential (primary) hypertension: Secondary | ICD-10-CM

## 2021-08-27 NOTE — Progress Notes (Signed)
SUBJECTIVE:   CHIEF COMPLAINT / HPI:   Diabetes - A1c today 7.7, up from 7.3 on 06/09/21 - current regimen includes Lantus 60 units daily and Novolog sliding scale with meals (15 units w/ breakfast, 9 units with lunch if CBG >180, and 9 units with dinner) - she reports previously trying PO diabetes medications, however these don't work for her - she cannot recall any specific intolerances or adverse side effects from previous medications tried - she is happy with her current regimen, even though it involves many pokes throughout the day - she likes it because the insulin works well for her - denies episodes of dizziness, pre-syncope, etc - no falls or averse outcomes due to low blood sugar  HLD - current regimen includes lovastatin 40 mg qHS - reports good tolerance of this medication - previously intolerant to lipitor, had leg cramps and soreness - Ms. Hardt is in the primary prevention group (LDL <100, trig <150) - last lipid panel 06/09/21 demonstrates LDL 110 and trig 362 - patient cannot remember if she was fasting or not for this lab - per pharmacy recommendations, could switch to high intensity statin such as rosuvastatin 40 mg   PERTINENT  PMH / PSH: HFrEF, mild aortic stenosis, HTN, HLD, GERD, esophageal dysphagia, T2DM, OA, restless leg syndrome  OBJECTIVE:   BP (!) 136/46   Pulse 63   Ht 5' (1.524 m)   Wt 199 lb 3.2 oz (90.4 kg)   LMP  (LMP Unknown)   SpO2 96%   BMI 38.90 kg/m    PHQ-9:  Depression screen [facility] 2/9 08/28/2021 06/09/2021 03/02/2021  Decreased Interest 0 0 0  Down, Depressed, Hopeless 0 0 0  PHQ - 2 Score 0 0 0  Altered sleeping 3 3 -  Tired, decreased energy 1 1 -  Change in appetite 0 0 -  Feeling bad or failure about yourself  0 0 -  Trouble concentrating 0 0 -  Moving slowly or fidgety/restless 0 2 -  Suicidal thoughts 0 0 -  PHQ-9 Score 4 6 -  Difficult doing work/chores Not difficult at all - -  Some recent data might be hidden      GAD-7:  GAD 7 : Generalized Anxiety Score 05/20/2020 05/16/2018 04/24/2018 05/25/2016  Nervous, Anxious, on Edge '1 1 1 '$ 0  Control/stop worrying 0 '1 1 1  '$ Worry too much - different things '1 1 1 1  '$ Trouble relaxing '1 2 2 1  '$ Restless 0 0 0 0  Easily annoyed or irritable 0 1 1 0  Afraid - awful might happen 0 0 0 0  Total GAD 7 Score '3 6 6 3  '$ Anxiety Difficulty Not difficult at all Not difficult at all Not difficult at all Somewhat difficult     Physical Exam General: Awake, alert, oriented Cardiovascular: Regular rate and rhythm, S1 and S2 present, no murmurs auscultated Respiratory: Lung fields clear to auscultation bilaterally Extremities: No bilateral lower extremity edema, palpable pedal and pretibial pulses bilaterally Neuro: Cranial nerves II through X grossly intact, able to move all extremities spontaneously   ASSESSMENT/PLAN:   HLD (hyperlipidemia) Currently on lovastatin 40 mg qHS. Tolerating well, patient reports good compliance. Last lipid panel 06/09/21 demonstrates LDL 110 and trig 362; however patient cannot remember if she was fasting or not for this lab. She is not fasting at time of visit, so I've ordered a future lipid panel and asked her to come back for a fasting blood draw. If  lab returns with elevated LDL and trig, would first assess for compliance and then consider switching to high intensity statin (rosuvastatin 40 mg), with careful attention to previous intolerance to lipitor.   Diabetes mellitus, type II ([place]) A1c today 7.7, up from 7.3 on 06/09/21. Current regimen includes Lantus 60 units daily and Novolog sliding scale with meals (15 units w/ breakfast, 9 units with lunch if CBG >180, and 9 units with dinner). Previously tried oral medications, but she reports this did not work for her. Discussed newer PO meds like Jardiance and IM meds like Trulicity/Ozempic. Patient declines at this time because she is happy with her regimen and does not mind the sticks multiple  times per day. Given age, risk factors, and A1c currently at goal, would really like her to consider getting off insulin regimen. Patient amenable to meeting with Dr. Valentina Lucks to discuss current regimen and possible adjustments.   Healthcare maintenance Received influenza vaccine at appointment today. Tolerated well, no reactions.      [name], MD [place]

## 2021-08-28 ENCOUNTER — Encounter: Payer: Self-pay | Admitting: Family Medicine

## 2021-08-28 ENCOUNTER — Other Ambulatory Visit: Payer: Self-pay

## 2021-08-28 ENCOUNTER — Ambulatory Visit: Payer: Medicare Other | Admitting: Family Medicine

## 2021-08-28 VITALS — BP 136/46 | HR 63 | Ht 60.0 in | Wt 199.2 lb

## 2021-08-28 DIAGNOSIS — E785 Hyperlipidemia, unspecified: Secondary | ICD-10-CM | POA: Diagnosis not present

## 2021-08-28 DIAGNOSIS — E1143 Type 2 diabetes mellitus with diabetic autonomic (poly)neuropathy: Secondary | ICD-10-CM | POA: Diagnosis not present

## 2021-08-28 DIAGNOSIS — Z0000 Encounter for general adult medical examination without abnormal findings: Secondary | ICD-10-CM

## 2021-08-28 DIAGNOSIS — Z23 Encounter for immunization: Secondary | ICD-10-CM

## 2021-08-28 DIAGNOSIS — Z794 Long term (current) use of insulin: Secondary | ICD-10-CM

## 2021-08-28 LAB — POCT GLYCOSYLATED HEMOGLOBIN (HGB A1C): HbA1c, POC (controlled diabetic range): 7.7 % — AB (ref 0.0–7.0)

## 2021-08-28 NOTE — Patient Instructions (Signed)
It was wonderful to see you today. Thank you for allowing me to be a part of your care. Below is a short summary of what we discussed at your visit today:  Diabetes - please see Dr. Valentina Lucks at your appointment to talk about changing your diabetes medicine regimen  BP - looking good today, no changes to meds at this time  Cholesterol - we will simply recheck your lipid panel at your convenience - come back any time to have the lab drawn, simply call the day before and let us know when you'll arrive - please arrive fasting to this blood work  Please bring all of your medications to every appointment!  If you have any questions or concerns, please do not hesitate to contact us via phone or MyChart message.   [name], MD

## 2021-08-30 NOTE — Assessment & Plan Note (Signed)
A1c today 7.7, up from 7.3 on 06/09/21. Current regimen includes Lantus 60 units daily and Novolog sliding scale with meals (15 units w/ breakfast, 9 units with lunch if CBG >180, and 9 units with dinner). Previously tried oral medications, but she reports this did not work for her. Discussed newer PO meds like Jardiance and IM meds like Trulicity/Ozempic. Patient declines at this time because she is happy with her regimen and does not mind the sticks multiple times per day. Given age, risk factors, and A1c currently at goal, would really like her to consider getting off insulin regimen. Patient amenable to meeting with Dr. Valentina Lucks to discuss current regimen and possible adjustments.

## 2021-08-30 NOTE — Assessment & Plan Note (Addendum)
Received influenza vaccine at appointment today. Tolerated well, no reactions.

## 2021-08-30 NOTE — Assessment & Plan Note (Signed)
Currently on lovastatin 40 mg qHS. Tolerating well, patient reports good compliance. Last lipid panel 06/09/21 demonstrates LDL 110 and trig 362; however patient cannot remember if she was fasting or not for this lab. She is not fasting at time of visit, so I've ordered a future lipid panel and asked her to come back for a fasting blood draw. If lab returns with elevated LDL and trig, would first assess for compliance and then consider switching to high intensity statin (rosuvastatin 40 mg), with careful attention to previous intolerance to lipitor.

## 2021-08-31 ENCOUNTER — Telehealth: Payer: Self-pay

## 2021-08-31 NOTE — Telephone Encounter (Signed)
Pt called [employer]. They need a referral placed in order to set her up for transportation. They will call her once the referral has been placed. Ottis Stain, CMA

## 2021-09-10 ENCOUNTER — Encounter: Payer: Self-pay | Admitting: Pharmacist

## 2021-09-10 ENCOUNTER — Ambulatory Visit (INDEPENDENT_AMBULATORY_CARE_PROVIDER_SITE_OTHER): Payer: Medicare Other | Admitting: Pharmacist

## 2021-09-10 ENCOUNTER — Other Ambulatory Visit: Payer: Medicare Other

## 2021-09-10 ENCOUNTER — Other Ambulatory Visit: Payer: Self-pay

## 2021-09-10 ENCOUNTER — Other Ambulatory Visit: Payer: Self-pay | Admitting: Family Medicine

## 2021-09-10 DIAGNOSIS — E1143 Type 2 diabetes mellitus with diabetic autonomic (poly)neuropathy: Secondary | ICD-10-CM | POA: Diagnosis not present

## 2021-09-10 DIAGNOSIS — E785 Hyperlipidemia, unspecified: Secondary | ICD-10-CM

## 2021-09-10 DIAGNOSIS — Z794 Long term (current) use of insulin: Secondary | ICD-10-CM

## 2021-09-10 NOTE — Progress Notes (Signed)
Reviewed: I agree with Dr. Koval's documentation and management. 

## 2021-09-10 NOTE — Patient Instructions (Signed)
It was great to see you again today! Glad you are doing well!  We are not making any changes today. Please continue taking your Lantus 60 units daily.   Continue taking your Novolog 10 units with meals as you are doing; please take it BEFORE you eat to make sure it covers your meal.   Please let us know if you have any questions! You can follow-up with the pharmacy clinic as needed.

## 2021-09-10 NOTE — Assessment & Plan Note (Signed)
Diabetes longstanding since 2008 currently well controlled. Patient is able to verbalize appropriate hypoglycemia management plan. Medication adherence appears optimal.  -Continued basal insulin Lantus (insulin glargine) inject 60 units once daily. -Continued  rapid insulin Novolog (insulin aspart) inject 10 units once daily before breakfast. -Extensively discussed pathophysiology of diabetes, recommended lifestyle interventions, dietary effects on blood sugar control -Counseled on s/sx of and management of hypoglycemia -Counseled on appropriate administration of rapid-acting insulin, inject Novolog ~30 minutes before meals, rather than after meals. -Next A1C anticipated in 3 months.

## 2021-09-10 NOTE — Progress Notes (Signed)
S:     Chief Complaint  Patient presents with   Medication Management    Diabetes   Patient arrives in good spirits ambulating with a walker.  Presents for diabetes evaluation, education, and management Patient was referred and last seen by Primary Care Provider on 08/28/2021 Patient was referred on 08/28/2021.  Patient was last seen by Primary Care Provider on 08/28/2021.   Medication adherence reported optimal .   Current diabetes medications include: Lantus 60 units daily, Novolog 10 units once a day usually after breakfast, depends on her blood Current hypertension medications include: amlodipine 10 mg and 2.5 mg Current hyperlipidemia medications include: lovastatin 40 mg  Patient reports hypoglycemic events. Lows sometimes if doesn't eat, 70 before eating supper, 50 one time before supper (felt like she was going to pass out, hadn't eaten)  Patient reported dietary habits: Eats 1-2 meals/day   Home fasting blood sugars: 130s   Checks blood pressure in the morning - 921 systolic one time, 194, 174, 151, 161, "bottom numbers good"  O:  Physical Exam Vitals reviewed.  Constitutional:      Appearance: Normal appearance.  Pulmonary:     Effort: Pulmonary effort is normal.  Musculoskeletal:     Right lower leg: No edema.     Left lower leg: No edema.  Neurological:     Mental Status: She is alert.  Psychiatric:        Mood and Affect: Mood normal.        Behavior: Behavior normal.        Thought Content: Thought content normal.        Judgment: Judgment normal.   Review of Systems  Neurological:  Negative for dizziness.  All other systems reviewed and are negative.  Lab Results  Component Value Date   HGBA1C 7.7 (A) 08/28/2021   Vitals:   09/10/21 1129  BP: 138/68  Pulse: 74  SpO2: 96%    Lipid Panel     Component Value Date/Time   CHOL 207 (H) 06/09/2021 1632   TRIG 362 (H) 06/09/2021 1632   HDL 35 (L) 06/09/2021 1632   CHOLHDL 5.9 (H) 06/09/2021 1632    CHOLHDL 4.2 07/16/2017 0402   VLDL 50 (H) 07/16/2017 0402   LDLCALC 110 (H) 06/09/2021 1632   LDLDIRECT 103 (H) 03/11/2010 2127    A/P: Diabetes longstanding since 2008 currently well controlled. Patient is able to verbalize appropriate hypoglycemia management plan. Medication adherence appears optimal.  -Continued basal insulin Lantus (insulin glargine) inject 60 units once daily. -Continued  rapid insulin Novolog (insulin aspart) inject 10 units once daily before breakfast. -Extensively discussed pathophysiology of diabetes, recommended lifestyle interventions, dietary effects on blood sugar control -Counseled on s/sx of and management of hypoglycemia -Counseled on appropriate administration of rapid-acting insulin, inject Novolog ~30 minutes before meals, rather than after meals. -Next A1C anticipated in 3 months.   Hyperlipidemia longstanding since 2008, currently somewhat controlled, fasting lipid panel done on 09/10/21. -Continue lovastatin 40 mg once daily, consider switching to rosuvastatin 40 mg once lipid panel comes back, if LDL is still above goal.  Hypertension longstanding since 2019 currently well controlled.  Blood pressure goal = <140/90 mmHg. Medication adherence optimal. -Continue valsartan 320 mg once daily -Continue torsemide 10 mg 2 tablets (20mg ) once daily  -Continue spironolactone 25 mg  once daily  Written patient instructions provided.  Total time in face to face counseling 45 minutes.   Follow up with Pharmacist PRN, PCP Clinic Visit In  PRN once lipid panel results.   Patient seen with Meyer Russel, PharmD Candidate, Juluis Pitch PharmD - PGY-1 Pharmacy Resident and Rebbeca Paul, PharmD - PGY2 Pharmacy Resident.

## 2021-09-11 LAB — LDL CHOLESTEROL, DIRECT: LDL Direct: 87 mg/dL (ref 0–99)

## 2021-09-11 NOTE — Telephone Encounter (Signed)
Pt had an appointment with Dr. Valentina Lucks yesterday. Pt used [facility] transportation to get here. Ottis Stain, CMA

## 2021-09-12 ENCOUNTER — Other Ambulatory Visit: Payer: Self-pay | Admitting: Family Medicine

## 2021-10-27 ENCOUNTER — Ambulatory Visit: Payer: Medicare Other | Admitting: Podiatry

## 2021-10-27 ENCOUNTER — Other Ambulatory Visit: Payer: Self-pay

## 2021-10-27 ENCOUNTER — Encounter: Payer: Self-pay | Admitting: Podiatry

## 2021-10-27 DIAGNOSIS — M79675 Pain in left toe(s): Secondary | ICD-10-CM

## 2021-10-27 DIAGNOSIS — M79674 Pain in right toe(s): Secondary | ICD-10-CM

## 2021-10-27 DIAGNOSIS — L03032 Cellulitis of left toe: Secondary | ICD-10-CM | POA: Diagnosis not present

## 2021-10-27 DIAGNOSIS — B351 Tinea unguium: Secondary | ICD-10-CM | POA: Diagnosis not present

## 2021-10-27 DIAGNOSIS — L02612 Cutaneous abscess of left foot: Secondary | ICD-10-CM

## 2021-10-27 DIAGNOSIS — Z794 Long term (current) use of insulin: Secondary | ICD-10-CM

## 2021-10-27 DIAGNOSIS — E1143 Type 2 diabetes mellitus with diabetic autonomic (poly)neuropathy: Secondary | ICD-10-CM

## 2021-10-27 DIAGNOSIS — L600 Ingrowing nail: Secondary | ICD-10-CM | POA: Diagnosis not present

## 2021-10-27 MED ORDER — DOXYCYCLINE HYCLATE 100 MG PO CAPS: ORAL_CAPSULE | ORAL | 0 refills | Status: DC

## 2021-10-27 NOTE — Patient Instructions (Signed)
EPSOM SALT FOOT SOAK INSTRUCTIONS  *IF YOU HAVE BEEN PRESCRIBED ANTIBIOTICS, TAKE AS INSTRUCTED UNTIL ALL ARE GONE*  Shopping List:  A. Plain epsom salt (not scented) B. Neosporin Cream/Ointment or Bacitracin Cream/Ointment (or prescribed antiobiotic drops/cream/ointment) C. 1-inch fabric band-aids   Place 1/4 cup of epsom salts in 2 quarts of warm tap water. IF YOU ARE DIABETIC, OR HAVE NEUROPATHY, CHECK THE TEMPERATURE OF THE WATER WITH YOUR ELBOW.   Submerge your foot/feet in the solution and soak for 10-15 minutes.      3.  Next, remove your foot/feet from solution, blot dry the affected area.    4.  Apply light amount of antibiotic cream/ointment and cover with fabric band-aid .  5.  This soak should be done once a day for 10 days.   6.  Monitor for any signs/symptoms of infection such as redness, swelling, odor, drainage, increased pain, or non-healing of digit.   7.  Please do not hesitate to call the office and speak to a Nurse or Doctor if you have questions.   8.  If you experience fever, chills, nightsweats, nausea or vomiting with worsening of digit, please go to the emergency room.

## 2021-10-27 NOTE — Progress Notes (Signed)
Subjective:  Patient ID: Rebecca Walter, female    DOB: March 11, 1941,  MRN: [id number]  Rebecca Walter presents to clinic today for preventative diabetic foot care and painful thick toenails that are difficult to trim. Pain interferes with ambulation. Aggravating factors include wearing enclosed shoe gear. Pain is relieved with periodic professional debridement.  Patient states her left great toe did well after last visit's treatment. She states the toe is just starting to become tender again due to length of toenail. Digit is most sensitive when wearing enclosed shoe gear. She denies any fever, chills, night sweats, nausea or vomiting.  PCP is [name], MD , and last visit was 08/28/2021.  Allergies  Allergen Reactions   Acyclovir     Other reaction(s): diarrhea   Baclofen Other (See Comments)    Caused leg weakness   Hydrochlorothiazide Other (See Comments)    Uric acid elevation and gout.      Liraglutide Other (See Comments)    Tongue Glossitus   Losartan Potassium     Other reaction(s): diarrhea   Lotensin [Benazepril] Other (See Comments)    Dry cough    Benazepril Hcl     Other reaction(s): dry cough   Metoprolol Succinate [Metoprolol]     Other reaction(s): coughing   Beta Adrenergic Blockers Other (See Comments)    Occurred with metoprolol  coughing   Other Diarrhea and Other (See Comments)    Occurred with metoprolol  REACTION: dry coughing    Review of Systems: Negative except as noted in the HPI. Objective:   Constitutional Rebecca Walter is a pleasant 80 y.o. Caucasian female, in NAD. AAO x 3.   Vascular CFT immediate b/l LE. Palpable DP/PT pulses b/l LE. Digital hair sparse b/l. Skin temperature gradient WNL b/l. No pain with calf compression b/l. No edema noted b/l. Lower extremity skin temperature gradient within normal limits. No cyanosis or clubbing noted.  Neurologic Normal speech. Oriented to person, place, and time. Pt has subjective symptoms of  neuropathy. Protective sensation intact 5/5 intact bilaterally with 10g monofilament b/l.  Dermatologic Pedal integument with normal turgor, texture and tone b/l LE. No open wounds b/l. No interdigital macerations b/l. Toenails 2-5 bilaterally and R hallux elongated, thickened, discolored with subungual debris. +Tenderness with dorsal palpation of nailplates. No hyperkeratotic or porokeratotic lesions present. Pincer nail deformity L hallux. No erythema, no edema, no drainage, no fluctuance. Nail border hypertrophy present.  Sign(s) of infection: pinpoint purulent drainage at distal tip which does not penetrate into deep tissue. She also has erythema, and pain on palpation.  Orthopedic: Normal muscle strength 5/5 to all lower extremity muscle groups bilaterally. Hammertoe(s) noted to the L 2nd toe and R 2nd toe. Utilizes rollator for ambulation assistance.   Radiographs: None Assessment:   1. Pain due to onychomycosis of toenails of both feet   2. Ingrown toenail   3. Cellulitis and abscess of toe of left foot   4. Type 2 diabetes mellitus with diabetic autonomic neuropathy, with long-term current use of insulin ([place])    Plan:  Patient was evaluated and treated and all questions answered. Consent given for treatment as described below: -Examined patient. -Continue diabetic foot care principles: inspect feet daily, monitor glucose as recommended by PCP and/or Endocrinologist, and follow prescribed diet per PCP, Endocrinologist and/or dietician. -Toenails 2-5 bilaterally and R hallux debrided in length and girth without iatrogenic bleeding with sterile nail nipper and dremel.  -Offending nail border debrided and curretaged via slantback procedure  medial border left hallux utilizing sterile nail nipper and currette. Border(s) irrigated with alcohol and hydrogen peroxide. Triple antibiotic ointment and band-aid applied. Dispensed written instructions for once daily epsom salt soaks for 10 days. Patient  noted relief of pain symptoms post-treatment today. -We discussed need for permanent procedure of left great toenail due to chronic ingrown toenail left great toe. She will follow up with Dr. Paulla Dolly for this. -Rx for Doxycyline 100 mg, #20, to be taken one capsule twice daily for 10 days. -Patient/POA to call should there be question/concern in the interim.  Return to clinic to see Dr. Paulla Dolly for chronic ingrown toenail left hallux after Thanksgiving. Return to see me in about 3 months (around 01/27/2022).  Marzetta Board, DPM

## 2021-11-06 ENCOUNTER — Other Ambulatory Visit: Payer: Self-pay | Admitting: Family Medicine

## 2021-11-09 ENCOUNTER — Other Ambulatory Visit: Payer: Self-pay | Admitting: Family Medicine

## 2021-11-09 DIAGNOSIS — F419 Anxiety disorder, unspecified: Secondary | ICD-10-CM

## 2021-11-17 ENCOUNTER — Ambulatory Visit (INDEPENDENT_AMBULATORY_CARE_PROVIDER_SITE_OTHER): Payer: Medicare Other

## 2021-11-17 VITALS — Ht 60.0 in

## 2021-11-17 DIAGNOSIS — Z0000 Encounter for general adult medical examination without abnormal findings: Secondary | ICD-10-CM | POA: Diagnosis not present

## 2021-11-17 NOTE — Progress Notes (Signed)
Subjective:   Rebecca Walter is a 80 y.o. female who presents for Medicare Annual (Subsequent) preventive examination.  Patient consented to have virtual visit and was identified by name and date of birth. Method of visit: Telephone  Encounter participants: Patient: Rebecca Walter - located at Home Nurse/Provider: Dorna Walter - located at [facility] Others (if applicable): NA  Review of Systems: Defer to PCP  Cardiac Risk Factors include: advanced age (>75mn, >>29women);diabetes mellitus;obesity (BMI >30kg/m2);sedentary lifestyle  Objective:   Vitals: Ht 5' (1.524 m)   LMP  (LMP Unknown)   BMI 38.90 kg/m   Body mass index is 38.9 kg/m.  Advanced Directives 11/17/2021 06/30/2021 06/09/2021 01/30/2021 01/01/2021 12/25/2020 10/29/2020  Does Patient Have a Medical Advance Directive? Yes Yes No;Yes No No No No  Type of [place] - - -  Does patient want to make changes to medical advance directive? No - Patient declined - No - Patient declined - - - -  Copy of [place]? Yes - validated most recent copy scanned in chart (See row information) Yes - validated most recent copy scanned in chart (See row information) Yes - validated most recent copy scanned in chart (See row information) - - - -  Would patient like information on creating a medical advance directive? - - - No - Patient declined No - Patient declined No - Patient declined No - Patient declined   Tobacco Social History   Tobacco Use  Smoking Status Never  Smokeless Tobacco Never     Clinical Intake:  Pre-visit preparation completed: Yes  How often do you need to have someone help you when you read instructions, pamphlets, or other written materials from your doctor or pharmacy?: 2 - Rarely What is the last grade level you completed in school?: High School  Interpreter Needed?: No  Past  Medical History:  Diagnosis Date   Anxiety    Arthritis    Candidiasis of skin 02/14/2020   CARCINOMA, BASAL CELL 05/26/2007   Qualifier: History of  By: CBarbra SarksMD, Rachel     Chronic diastolic CHF (congestive heart failure) ([place]    Echocardiogram 01/2020: EF 70-75, no RWMA, Gr 1 DD, normal RVSF, RVSP 45.8, mild to mod LAE, trivial MR, mild PI   Diabetes mellitus    Diarrhea of presumed infectious origin 01/16/2021   DYSKINESIA, ESOPHAGUS 09/13/2007   Qualifier: Diagnosis of  By: SBrigitte PulseMD, Kimberlee     Early satiety 09/04/2020   Epistaxis 02/04/2020   Generalized weakness 12/15/2018   GERD (gastroesophageal reflux disease)    Gout    History of nuclear stress test    Myoview 06/2020: EF 75, no ischemia or infarction, low risk   Hoarseness 11/27/2019   Hyperlipidemia    Hypertension    Hypertensive heart disease with CHF (congestive heart failure) ([place]    Hyponatremia    Leg fatigue 01/03/2021   Mild aortic stenosis 06/2017   Mild mitral regurgitation 06/2017   Moderate pulmonary valve insufficiency 06/2017   OBESITY, NOS 02/16/2007   Qualifier: Diagnosis of  By: KDrucie Ip    Osteopenia    Vertigo 08/29/2013   Past Surgical History:  Procedure Laterality Date   KIDNEY SURGERY     KNEE SURGERY     MENISECTOMY     WRIST SURGERY     Family History  Problem Relation Age of Onset  Stroke Mother    Heart disease Father    Stroke Father    Hypertension Father    Cancer Sister    Cancer Brother    Heart disease Brother    Diabetes Maternal Uncle    Social History   Socioeconomic History   Marital status: Widowed    Spouse name: Not on file   Number of children: 1   Years of education: 23   Highest education level: Not on file  Occupational History   Occupation: retired-office work  Tobacco Use   Smoking status: Never   Smokeless tobacco: Never  Vaping Use   Vaping Use: Never used  Substance and Sexual Activity   Alcohol use: No   Drug use: No   Sexual  activity: Not Currently  Other Topics Concern   Not on file  Social History Narrative   Patient lives alone in [place], she is a widow.    Patient has one son who checks in on her daily and helps with various things.   One granddaughter in [place].    Patient enjoys watching TV, zoom bible study, plants and cooking.    Patient has an aide who comes MWF.   Social Determinants of Health   Financial Resource Strain: Low Risk    Difficulty of Paying Living Expenses: Not hard at all  Food Insecurity: No Food Insecurity   Worried About Charity fundraiser in the Last Year: Never true   [place] in the Last Year: Never true  Transportation Needs: No Transportation Needs   Lack of Transportation (Medical): No   Lack of Transportation (Non-Medical): No  Physical Activity: Inactive   Days of Exercise per Week: 0 days   Minutes of Exercise per Session: 0 min  Stress: No Stress Concern Present   Feeling of Stress : Only a little  Social Connections: Moderately Integrated   Frequency of Communication with Friends and Family: More than three times a week   Frequency of Social Gatherings with Friends and Family: Twice a week   Attends Religious Services: More than 4 times per year   Active Member of [employer] or Organizations: Yes   Attends Archivist Meetings: More than 4 times per year   Marital Status: Widowed   Outpatient Encounter Medications as of 11/17/2021  Medication Sig   acetaminophen (TYLENOL) 325 MG tablet Take 325 mg by mouth every 8 (eight) hours as needed (pain).    allopurinol (ZYLOPRIM) 100 MG tablet Take 2 tablets (200 mg total) by mouth daily.   amLODipine (NORVASC) 10 MG tablet TAKE 1 TABLET BY MOUTH EVERY DAY   amLODipine (NORVASC) 2.5 MG tablet TAKE 1 TABLET 2 (TWO) TIMES DAILY AS NEEDED (FOR ELEVATED BLOOD PRESSURE SYSTOLIC >161 ONLY).   aspirin 81 MG chewable tablet Chew 1 tablet (81 mg total) by mouth daily.   baclofen (LIORESAL) 10 MG tablet TAKE 1  TABLET BY MOUTH TWICE A DAY   Blood Glucose Monitoring Suppl (ACCU-CHEK AVIVA PLUS) w/Device KIT 1 Device by Does not apply route daily.   Blood Glucose Monitoring Suppl (ACCU-CHEK AVIVA PLUS) w/Device KIT See admin instructions.   busPIRone (BUSPAR) 15 MG tablet TAKE 1 TABLET BY MOUTH TWICE A DAY   Camphor-Eucalyptus-Menthol (VICKS VAPORUB) 4.73-1.2-2.6 % OINT See admin instructions.   glucose blood (ACCU-CHEK AVIVA PLUS) test strip CHECK BLOOD SUGAR FIRST  THING IN THE MORNING BEFORE EATING, AFTER LUNCH, AND  AFTER DINNER  TOTAL 3 TIMES DAILY   ibuprofen (ADVIL)  200 MG tablet Take 200 mg by mouth daily as needed for mild pain or moderate pain.   insulin glargine (LANTUS SOLOSTAR) 100 UNIT/ML Solostar Pen Inject 60 Units into the skin daily.   Insulin Pen Needle (B-D ULTRAFINE III SHORT PEN) 31G X 8 MM MISC Use to inject insulin as prescribed.   lidocaine (XYLOCAINE) 2 % solution TAKE 15 MLS IN THE THROAT TWICE A DAY AS NEEDED ,USE WITH MAALOX   loperamide (IMODIUM A-D) 2 MG tablet 1 tablet as needed   loratadine (CLARITIN) 10 MG tablet Take 10 mg by mouth daily.   lovastatin (MEVACOR) 40 MG tablet Take 1 tablet (40 mg total) by mouth at bedtime.   metoCLOPramide (REGLAN) 5 MG tablet 1 tablet before meals   Multiple Vitamin (MULTI VITAMIN) TABS 1 tablet   NOVOLOG FLEXPEN 100 UNIT/ML FlexPen INJECT 9-18 UNITS INTO THE SKIN SEE ADMIN INSTRUCTIONS. INJECT 15 UNITS SUBCUTANEOUSLY DAILY WITH BREAKFAST, INJECT 9 UNITS WITH LUNCH AS NEEDED FOR CBG 180 OR MORE, AND INJECT 9 UNITS WITH SUPPER   pantoprazole (PROTONIX) 40 MG tablet TAKE 1 TABLET BY MOUTH TWICE A DAY   sertraline (ZOLOFT) 100 MG tablet TAKE 1 TABLET BY MOUTH EVERY DAY   sertraline (ZOLOFT) 50 MG tablet TAKE 1 TABLET IN THE AFTERNOON, ONE DOSE AT BEDTIME   spironolactone (ALDACTONE) 25 MG tablet TAKE 1 TABLET BY MOUTH EVERY DAY   torsemide (DEMADEX) 10 MG tablet TAKE 2 TABLETS (20 MG TOTAL) BY MOUTH DAILY AS NEEDED (EDEMA, >3LB WEIGHT  GAIN).   valsartan (DIOVAN) 320 MG tablet TAKE 1 TABLET BY MOUTH EVERY DAY   Vitamin E 400 units TABS 1 tablet   doxycycline (VIBRAMYCIN) 100 MG capsule 1 capsule by mouth bid x 10 days. (Patient not taking: Reported on 11/17/2021)   Polyethyl Glycol-Propyl Glycol (SYSTANE) 0.4-0.3 % GEL ophthalmic gel Place 1 application into both eyes 3 (three) times daily. (Patient not taking: Reported on 11/17/2021)   No facility-administered encounter medications on file as of 11/17/2021.   Activities of Daily Living In your present state of health, do you have any difficulty performing the following activities: 11/17/2021  Hearing? N  Vision? N  Difficulty concentrating or making decisions? N  Walking or climbing stairs? Y  Dressing or bathing? Y  Comment Patient has an aide MWF that helps  Doing errands, shopping? Y  Comment Orders from [facility] and gets delivery  Preparing Food and eating ? N  Using the Toilet? N  In the past six months, have you accidently leaked urine? Y  Do you have problems with loss of bowel control? N  Managing your Medications? N  Managing your Finances? N  Housekeeping or managing your Housekeeping? N  Some recent data might be hidden   Patient Care Team: [name], MD as PCP - General (Family Medicine) Fay Records, MD as PCP - Cardiology (Cardiology) Zadie Rhine Clent Demark, MD ([facility]) Wonda Horner, MD as Consulting Physician (Gastroenterology) Marzetta Board, DPM as Consulting Physician (Podiatry) Danice Goltz, MD as Consulting Physician (Ophthalmology)    Assessment:   This is a routine wellness examination for Kylin.  Exercise Activities and Dietary recommendations Current Exercise Habits: The patient does not participate in regular exercise at present, Exercise limited by: psychological condition(s);orthopedic condition(s)   Goals      HEMOGLOBIN A1C < 7     7.7 08/2021.      Weight (lb) < 180 lb (81.6 kg)       Fall  Risk Fall Risk  11/17/2021 08/28/2021 06/30/2021 06/09/2021 01/30/2021  Falls in the past year? '1 1 1 1 ' 0  Number falls in past yr: 0 0 0 0 0  Injury with Fall? 1 0 0 1 0  Risk for fall due to : Impaired balance/gait - Impaired mobility History of fall(s);Impaired balance/gait -  Risk for fall due to: Comment - - - - -  Follow up Falls prevention discussed - - Falls evaluation completed;Falls prevention discussed;Education provided -   Patients reports a fairly normal gait and balance. Patient does have a rolling walker to help ambulate.   Is the patient's home free of loose throw rugs in walkways, pet beds, electrical cords, etc?   yes      Grab bars in the bathroom? yes      Handrails on the stairs?   yes      Adequate lighting?   yes  Patient rating of health (0-10) scale: 8   Depression Screen PHQ 2/9 Scores 11/17/2021 11/17/2021 08/28/2021 06/09/2021  PHQ - 2 Score 0 0 0 0  PHQ- 9 Score 3 - 4 6    Cognitive Function MMSE - Mini Mental State Exam 09/13/2012  Orientation to time 5  Orientation to Place 5  Registration 3  Attention/ Calculation 5  Recall 2  Language- name 2 objects 2  Language- repeat 1  Language- follow 3 step command 3  Language- read & follow direction 1  Write a sentence 1  Copy design 1  Total score 29   6CIT Screen 11/17/2021 12/26/2019 05/17/2019  What Year? 0 points 0 points 0 points  What month? 0 points 0 points 0 points  What time? 0 points 0 points 0 points  Count back from 20 0 points 0 points 0 points  Months in reverse 0 points 0 points 0 points  Repeat phrase 0 points 0 points 0 points  Total Score 0 0 0   Immunization History  Administered Date(s) Administered   Fluad Quad(high Dose 65+) 08/28/2021   Influenza Split 11/05/2011, 09/20/2012   Influenza Whole 11/15/2007, 10/24/2009, 09/25/2010   Influenza, High Dose Seasonal PF 09/07/2018   Influenza,inj,Quad PF,6+ Mos 10/08/2014, 10/10/2015, 09/26/2017, 12/03/2020   Influenza-Unspecified  10/19/2013, 10/03/2016, 10/26/2019   PFIZER Comirnaty(Gray Top)Covid-19 Tri-Sucrose Vaccine 06/09/2021   PFIZER(Purple Top)SARS-COV-2 Vaccination 02/22/2020, 03/25/2020, 10/09/2020   Pneumococcal Conjugate-13 09/26/2017   Pneumococcal Polysaccharide-23 10/20/2004   Td 10/20/2002   Tdap 11/21/2017   Screening Tests Health Maintenance  Topic Date Due   Zoster Vaccines- Shingrix (1 of 2) Never done   COVID-19 Vaccine (5 - Booster for Pfizer series) 08/04/2021   FOOT EXAM  01/01/2022   HEMOGLOBIN A1C  02/25/2022   OPHTHALMOLOGY EXAM  05/27/2022   TETANUS/TDAP  11/22/2027   Pneumonia Vaccine 36+ Years old  Completed   INFLUENZA VACCINE  Completed   DEXA SCAN  Completed   HPV VACCINES  Aged Out   Qualifies for Shingles Vaccine? Yes   Zostavax completed No   Shingrix Completed?: No. Education has been provided regarding the importance of this vaccine. Patient has been advised to call insurance company to determine out of pocket expense if they have not yet received this vaccine. Advised may also receive vaccine at local pharmacy or Health Dept. Verbalized acceptance and understanding.  Cancer Screenings: Lung: Low Dose CT Chest recommended if Age 84-80 years, 30 pack-year currently smoking OR have quit w/in 15years. Patient does not qualify. Breast:  Up to date on Mammogram? Yes  Up to date of Bone Density/Dexa? Yes Colorectal: 2003  Additional Screenings: HIV Screening: Completed   Plan:  PCP apt scheduled for 01/12/2022. Patient will get Bivalent Booster then.  Discuss Singles vaccine with PCP.  Podiatry apt scheduled for 12/1.  I have personally reviewed and noted the following in the patient's chart:   Medical and social history Use of alcohol, tobacco or illicit drugs  Current medications and supplements Functional ability and status Nutritional status Physical activity Advanced directives List of other physicians Hospitalizations, surgeries, and ER visits in previous  12 months Vitals Screenings to include cognitive, depression, and falls Referrals and appointments  In addition, I have reviewed and discussed with patient certain preventive protocols, quality metrics, and best practice recommendations. A written personalized care plan for preventive services as well as general preventive health recommendations were provided to patient.  This visit was conducted virtually in the setting of the [place] pandemic.    Rebecca Walter, [place]  11/17/2021

## 2021-11-17 NOTE — Progress Notes (Signed)
I have reviewed this visit and agree with the documentation.   

## 2021-11-17 NOTE — Patient Instructions (Addendum)
You spoke to Rebecca Walter, [place] over the phone for your annual wellness visit.  We discussed goals:   Goals      HEMOGLOBIN A1C < 7     7.7 08/2021.      Weight (lb) < 180 lb (81.6 kg)       We also discussed recommended health maintenance. As discussed, you are due for: Health Maintenance  Topic Date Due   Zoster Vaccines- Shingrix (1 of 2) Never done   COVID-19 Vaccine (5 - Booster for Pfizer series) 08/04/2021   FOOT EXAM  01/01/2022   HEMOGLOBIN A1C  02/25/2022   OPHTHALMOLOGY EXAM  05/27/2022   TETANUS/TDAP  11/22/2027   Pneumonia Vaccine 62+ Years old  Completed   INFLUENZA VACCINE  Completed   DEXA SCAN  Completed   HPV VACCINES  Aged Out   PCP apt scheduled for 01/12/2022. Patient will get Bivalent Booster then.  Discuss Singles vaccine with PCP.  Podiatry apt scheduled for 12/1.  Preventive Care 33 Years and Older, Female Preventive care refers to lifestyle choices and visits with your health care provider that can promote health and wellness. Preventive care visits are also called wellness exams. What can I expect for my preventive care visit? Counseling Your health care provider may ask you questions about your: Medical history, including: Past medical problems. Family medical history. Pregnancy and menstrual history. History of falls. Current health, including: Memory and ability to understand (cognition). Emotional well-being. Home life and relationship well-being. Sexual activity and sexual health. Lifestyle, including: Alcohol, nicotine or tobacco, and drug use. Access to firearms. Diet, exercise, and sleep habits. Work and work Statistician. Sunscreen use. Safety issues such as seatbelt and bike helmet use. Physical exam Your health care provider will check your: Height and weight. These may be used to calculate your BMI (body mass index). BMI is a measurement that tells if you are at a healthy weight. Waist circumference. This measures the  distance around your waistline. This measurement also tells if you are at a healthy weight and may help predict your risk of certain diseases, such as type 2 diabetes and high blood pressure. Heart rate and blood pressure. Body temperature. Skin for abnormal spots. What immunizations do I need? Vaccines are usually given at various ages, according to a schedule. Your health care provider will recommend vaccines for you based on your age, medical history, and lifestyle or other factors, such as travel or where you work. What tests do I need? Screening Your health care provider may recommend screening tests for certain conditions. This may include: Lipid and cholesterol levels. Hepatitis C test. Hepatitis B test. HIV (human immunodeficiency virus) test. STI (sexually transmitted infection) testing, if you are at risk. Lung cancer screening. Colorectal cancer screening. Diabetes screening. This is done by checking your blood sugar (glucose) after you have not eaten for a while (fasting). Mammogram. Talk with your health care provider about how often you should have regular mammograms. BRCA-related cancer screening. This may be done if you have a family history of breast, ovarian, tubal, or peritoneal cancers. Bone density scan. This is done to screen for osteoporosis. Talk with your health care provider about your test results, treatment options, and if necessary, the need for more tests. Follow these instructions at home: Eating and drinking  Eat a diet that includes fresh fruits and vegetables, whole grains, lean protein, and low-fat dairy products. Limit your intake of foods with high amounts of sugar, saturated fats, and salt. Take  vitamin and mineral supplements as recommended by your health care provider. Do not drink alcohol if your health care provider tells you not to drink. If you drink alcohol: Limit how much you have to 0-1 drink a day. Know how much alcohol is in your drink. In  the [place] one drink equals one 12 oz bottle of beer (355 mL), one 5 oz glass of wine (148 mL), or one 1 oz glass of hard liquor (44 mL). Lifestyle Brush your teeth every morning and night with fluoride toothpaste. Floss one time each day. Exercise for at least 30 minutes 5 or more days each week. Do not use any products that contain nicotine or tobacco. These products include cigarettes, chewing tobacco, and vaping devices, such as e-cigarettes. If you need help quitting, ask your health care provider. Do not use drugs. If you are sexually active, practice safe sex. Use a condom or other form of protection in order to prevent STIs. Take aspirin only as told by your health care provider. Make sure that you understand how much to take and what form to take. Work with your health care provider to find out whether it is safe and beneficial for you to take aspirin daily. Ask your health care provider if you need to take a cholesterol-lowering medicine (statin). Find healthy ways to manage stress, such as: Meditation, yoga, or listening to music. Journaling. Talking to a trusted person. Spending time with friends and family. Minimize exposure to UV radiation to reduce your risk of skin cancer. Safety Always wear your seat belt while driving or riding in a vehicle. Do not drive: If you have been drinking alcohol. Do not ride with someone who has been drinking. When you are tired or distracted. While texting. If you have been using any mind-altering substances or drugs. Wear a helmet and other protective equipment during sports activities. If you have firearms in your house, make sure you follow all gun safety procedures. What's next? Visit your health care provider once a year for an annual wellness visit. Ask your health care provider how often you should have your eyes and teeth checked. Stay up to date on all vaccines. This information is not intended to replace advice given to you by your  health care provider. Make sure you discuss any questions you have with your health care provider. Document Revised: 06/03/2021 Document Reviewed: 06/03/2021 Elsevier Patient Education  2022 [place] Prevention in the Home, Adult Falls can cause injuries and can happen to people of all ages. There are many things you can do to make your home safe and to help prevent falls. Ask for help when making these changes. What actions can I take to prevent falls? General Instructions Use good lighting in all rooms. Replace any light bulbs that burn out. Turn on the lights in dark areas. Use night-lights. Keep items that you use often in easy-to-reach places. Lower the shelves around your home if needed. Set up your furniture so you have a clear path. Avoid moving your furniture around. Do not have throw rugs or other things on the floor that can make you trip. Avoid walking on wet floors. If any of your floors are uneven, fix them. Add color or contrast paint or tape to clearly mark and help you see: Grab bars or handrails. First and last steps of staircases. Where the edge of each step is. If you use a stepladder: Make sure that it is fully opened. Do not climb a  closed stepladder. Make sure the sides of the stepladder are locked in place. Ask someone to hold the stepladder while you use it. Know where your pets are when moving through your home. What can I do in the bathroom?   Keep the floor dry. Clean up any water on the floor right away. Remove soap buildup in the tub or shower. Use nonskid mats or decals on the floor of the tub or shower. Attach bath mats securely with double-sided, nonslip rug tape. If you need to sit down in the shower, use a plastic, nonslip stool. Install grab bars by the toilet and in the tub and shower. Do not use towel bars as grab bars. What can I do in the bedroom? Make sure that you have a light by your bed that is easy to reach. Do not use any  sheets or blankets for your bed that hang to the floor. Have a firm chair with side arms that you can use for support when you get dressed. What can I do in the kitchen? Clean up any spills right away. If you need to reach something above you, use a step stool with a grab bar. Keep electrical cords out of the way. Do not use floor polish or wax that makes floors slippery. What can I do with my stairs? Do not leave any items on the stairs. Make sure that you have a light switch at the top and the bottom of the stairs. Make sure that there are handrails on both sides of the stairs. Fix handrails that are broken or loose. Install nonslip stair treads on all your stairs. Avoid having throw rugs at the top or bottom of the stairs. Choose a carpet that does not hide the edge of the steps on the stairs. Check carpeting to make sure that it is firmly attached to the stairs. Fix carpet that is loose or worn. What can I do on the outside of my home? Use bright outdoor lighting. Fix the edges of walkways and driveways and fix any cracks. Remove anything that might make you trip as you walk through a door, such as a raised step or threshold. Trim any bushes or trees on paths to your home. Check to see if handrails are loose or broken and that both sides of all steps have handrails. Install guardrails along the edges of any raised decks and porches. Clear paths of anything that can make you trip, such as tools or rocks. Have leaves, snow, or ice cleared regularly. Use sand or salt on paths during winter. Clean up any spills in your garage right away. This includes grease or oil spills. What other actions can I take? Wear shoes that: Have a low heel. Do not wear high heels. Have rubber bottoms. Feel good on your feet and fit well. Are closed at the toe. Do not wear open-toe sandals. Use tools that help you move around if needed. These include: Canes. Walkers. Scooters. Crutches. Review your  medicines with your doctor. Some medicines can make you feel dizzy. This can increase your chance of falling. Ask your doctor what else you can do to help prevent falls. Where to find more information Centers for Disease Control and Prevention, [employer]: http://www.wolf.info/ National Institute on Aging: http://kim-miller.com/ Contact a doctor if: You are afraid of falling at home. You feel weak, drowsy, or dizzy at home. You fall at home. Summary There are many simple things that you can do to make your home safe and to  help prevent falls. Ways to make your home safe include removing things that can make you trip and installing grab bars in the bathroom. Ask for help when making these changes in your home. This information is not intended to replace advice given to you by your health care provider. Make sure you discuss any questions you have with your health care provider. Document Revised: 07/09/2020 Document Reviewed: 07/09/2020 Elsevier Patient Education  2022 [employer].  Our clinic's number is [phone]. Please call with questions or concerns about what we discussed today.

## 2021-11-18 ENCOUNTER — Ambulatory Visit: Payer: Medicare Other | Admitting: Podiatry

## 2021-11-19 ENCOUNTER — Ambulatory Visit: Payer: Medicare Other | Admitting: Podiatry

## 2021-11-19 ENCOUNTER — Other Ambulatory Visit: Payer: Self-pay

## 2021-11-19 ENCOUNTER — Encounter: Payer: Self-pay | Admitting: Podiatry

## 2021-11-19 DIAGNOSIS — L600 Ingrowing nail: Secondary | ICD-10-CM

## 2021-11-19 NOTE — Patient Instructions (Signed)

## 2021-11-20 NOTE — Progress Notes (Signed)
Subjective:   Patient ID: Rebecca Walter, female   DOB: 80 y.o.   MRN: [id number]   HPI Patient presents stating the big toenail my left foot has been sore on the border and making it hard for me to wear shoe gear comfortably.  Patient states she is tried to trim it tried to soak it without relief and it did not respond to trimming therapy by Dr.   Leonard Downing      Objective:  Physical Exam  Neurovascular status was found to be intact diabetes under good control with incurvated medial border of the left hallux painful when pressed no active drainage no redness noted     Assessment:  Chronic ingrown toenail deformity left hallux medial border     Plan:  H&P recommended correction and reviewed procedure allowing her to sign consent form.  Today I infiltrated the left hallux 60 mg like Marcaine mixture sterile prep done and using sterile instrumentation I remove the medial border I exposed matrix and I applied phenol 3 applications 30 seconds followed by alcohol lavage sterile dressing.  I gave instructions on soaks and to leave dressing on 24 hours but take it off earlier if any throbbing were to occur.  Patient will be seen back to recheck and is encouraged to call questions

## 2021-12-16 ENCOUNTER — Other Ambulatory Visit: Payer: Self-pay | Admitting: Family Medicine

## 2021-12-25 ENCOUNTER — Telehealth: Payer: Self-pay

## 2021-12-25 NOTE — Telephone Encounter (Signed)
Patient calls nurse line concerned about recent cbgs. Patient reports last night sugar was ~200 before bed time. Patient reports she had egg salad on toasted bread for dinner. Patient reports she woke up ~7am and fasting cbg ~241. Patient reports she ate breakfast consisting of scrambled eggs, sausage, grits and 2 pieces of buttered toast. Patient checked her sugar again 310. Patient reports ~10am she took 64 units of Lantus and 13 units of Humalog. Patient reports she checked her sugar ~12p and down to ~210. Patient reports these numbers are not normal for her.   Patient denies any excessive tiredness, vision changes, nausea vomiting or rapid pulse. Patient advised to drink plenty of fluids. Patient has a PCP apt scheduled for 1/24. Red flags discussed with patient.   Will forward to PCP and pharmacy team for additional recommendations.

## 2021-12-25 NOTE — Telephone Encounter (Signed)
Noted and agree. 

## 2021-12-25 NOTE — Telephone Encounter (Signed)
Contacted patient  RE elevated blood glucose  I have seen her many times in the past.   She reports a recent death in the family and also is working through a recent cancer diagnosis with a close friend of hers.  Rebecca Walter admits to not sleeping well, worrying and grieving.   We discussed how life stress will contribute to elevations in her blood glucose readings. I agreed with her request to make small increases to her basal and bolus insulin doses for the next several days with a goal of keeping her sugars in the low 200s or lower.  We agreed that accepting slightly higher readings was acceptable over the next several days to weeks as she works through this stressful time.   She verbalized appreciation for the call.  I encouraged her to call again in several days if her blood sugars remained higher the 250mg /dl.   Total time in phone interaction 11 minutes.

## 2021-12-28 ENCOUNTER — Telehealth: Payer: Self-pay

## 2021-12-28 MED ORDER — ONETOUCH ULTRA 2 W/DEVICE KIT: PACK | 0 refills | Status: DC

## 2021-12-28 MED ORDER — ONETOUCH ULTRA VI STRP: ORAL_STRIP | 12 refills | Status: DC

## 2021-12-28 NOTE — Telephone Encounter (Signed)
Patient calls nurse line requesting new rx for One touch Ultra meter and strips. Patient reports having a change in insurance and this is what her insurance covers now.   Sending in per protocol.   Talbot Grumbling, RN

## 2022-01-06 ENCOUNTER — Other Ambulatory Visit: Payer: Self-pay | Admitting: *Deleted

## 2022-01-06 MED ORDER — BUSPIRONE HCL 15 MG PO TABS: 15.0000 mg | ORAL_TABLET | Freq: Two times a day (BID) | ORAL | 1 refills | Status: DC

## 2022-01-08 ENCOUNTER — Telehealth: Payer: Self-pay

## 2022-01-08 NOTE — Telephone Encounter (Signed)
Patient calls nurse line reporting a "chalky" taste in her mouth. Patient reports this has been present for a "few" days now. Patient reports she has tried "everything," brushing her teeth multiple times a day, mouth washes and drinking plenty of water with no relief. Patient reports she does see "small spots" on her tongue. They do not hurt and she is unsure of how long they have been present. Patient reports food doesn't "taste right," "tastes bland."  Patient denies fevers, abdominal pain, cough, congestion or sick contacts.  Patient has an upcoming apt on 1/24 with PCP. Patient declined a sooner apt.  Red flags discussed with patient.

## 2022-01-11 NOTE — Patient Instructions (Addendum)
It was wonderful to see you today. Thank you for allowing me to be a part of your care. Below is a short summary of what we discussed at your visit today:  Diabetes Check Today your A1c was 7.2, down a little from the last check in September at 7.7.  - keep taking Lantus 60 units daily - keep daily Novolog 10 units with meals about 30 minutes before meals - come back for a check of your A1c in about six months  Labs Today we obtained a blood sample to make sure that your kidneys are still tolerating her blood pressure medicines well.  This test is called a BMP. If the results are normal, I will send you a letter or MyChart message. If the results are abnormal, I will give you a call.    Cholesterol Keep using lovastatin 40 mg daily to control your cholesterol.   COVID vaccine Today you received the bivalent COVID booster. You may experience some residual soreness at the injection site.  Gentle stretches and regular use of that arm will help speed up your recovery.  As the vaccines are giving your immune system a "practice run" against specific infections, you may feel a little under the weather for the next several days.  We recommend rest as needed and hydrating.  Health Maintenance We like to think about ways to keep you healthy for years to come. Below are some interventions and screenings we can offer to keep you healthy: - Shingles vaccines (may be obtained at your local pharmacy)  Cooking and Nutrition Classes [employer] in [facility] provides many classes at low or no cost to [employer], nutrition, and agriculture.  Their website offers a huge variety of information related to topics such as gardening, nutrition, cooking, parenting, and health.  Also listed are classes and events, both online and in-person.  Check out their website here: [email or web address]  Please bring all of your medications to every appointment!  If you have any  questions or concerns, please do not hesitate to contact us via phone or MyChart message.   [name], MD

## 2022-01-11 NOTE — Progress Notes (Signed)
SUBJECTIVE:   CHIEF COMPLAINT / HPI:   T2DM check - most recent labs below - A1c today 7.2%, down slightly from 7.7% 08/28/21 - direct LDL on 09/10/21 improved to 88 - current regimen: Lantus 60 units daily, novolog sliding scale 10 units with meals, lovastatin 40 mg - last saw Dr. Valentina Lucks on 09/10/21 for diabetes regimen discussion, provider notes below:  Diabetes longstanding since 2008 currently well controlled. Patient is able to verbalize appropriate hypoglycemia management plan. Medication adherence appears optimal.  -Continued basal insulin Lantus (insulin glargine) inject 60 units once daily. -Continued  rapid insulin Novolog (insulin aspart) inject 10 units once daily before breakfast. -Extensively discussed pathophysiology of diabetes, recommended lifestyle interventions, dietary effects on blood sugar control -Counseled on s/sx of and management of hypoglycemia -Counseled on appropriate administration of rapid-acting insulin, inject Novolog ~30 minutes before meals, rather than after meals. -Next A1C anticipated in 3 months.  - had noticed her daily readings were in the 200s, called and spoke with Dr. Valentina Lucks who recommended increases: - currently doing 70 units of lantus daily - novolog 14 units AM and 10 QHS - no lows on this new regimen - still injecting novolog after the meal, but does not have any lows recently doing this - checking blood sugars TID, most numbers under 200, no lows   Lab Results  Component Value Date   HGBA1C 7.2 (A) 01/12/2022   HGBA1C 7.7 (A) 08/28/2021   HGBA1C 7.3 (A) 06/09/2021   Lab Results  Component Value Date   LDLCALC 110 (H) 06/09/2021   CREATININE 0.88 07/21/2021   COVID vaccination - amenable to vaccine today  Odd taste in mouth - sour sort of taste - no aggravating or relieving factors - no chest or epigastric pain - worse in mornings - does take tums PR  PERTINENT  PMH / PSH:  Patient Active Problem List   Diagnosis Date  Noted   Esophageal dysphagia 03/27/2021   Basal cell carcinoma 03/02/2021   History of melanoma 01/31/2021   Abnormal MRI, spinal cord 01/03/2021   Bilateral leg numbness 12/31/2020   Carpal tunnel syndrome of right wrist 11/26/2020   Healthcare maintenance 10/11/2020   Laryngopharyngeal reflux (LPR) 06/19/2020   Frequent falls 04/17/2020   Restless leg syndrome 01/23/2020   Obstructive sleep apnea 12/04/2019   Hyponatremia    Diabetic autonomic neuropathy ([place]) 01/24/2019   Chronically dry eyes, right 12/15/2018   Hypertension 10/17/2018   Elevated TSH 11/21/2017   Chronic diastolic CHF (congestive heart failure) ([place]) 07/15/2017   Mild aortic stenosis 06/19/2017   Mild mitral regurgitation 06/19/2017   Moderate pulmonary valve insufficiency 06/19/2017   Lumbar back pain with radiculopathy affecting right lower extremity 10/29/2016   Allergic rhinitis 03/22/2011   INSOMNIA 09/16/2009   GASTROPARESIS 08/12/2009   Anxiety 10/18/2008   Diabetes mellitus, type II ([place]) 02/16/2007   HLD (hyperlipidemia), primary prevention, goal LDL <100 02/16/2007   GASTROESOPHAGEAL REFLUX, NO ESOPHAGITIS 02/16/2007   Osteoarthritis of both knees 02/16/2007   Osteopenia after menopause 02/16/2007   Gout 02/16/2007    OBJECTIVE:   BP (!) 142/56    Pulse 75    Ht 5' (1.524 m)    Wt 199 lb 12.8 oz (90.6 kg)    LMP  (LMP Unknown)    SpO2 94%    BMI 39.02 kg/m    PHQ-9:  Depression screen [facility] 2/9 01/12/2022 11/17/2021 11/17/2021  Decreased Interest 1 0 0  Down, Depressed, Hopeless 1 0 0  PHQ -  2 Score 2 0 0  Altered sleeping 3 3 -  Tired, decreased energy 3 0 -  Change in appetite 3 0 -  Feeling bad or failure about yourself  0 0 -  Trouble concentrating 0 0 -  Moving slowly or fidgety/restless 0 0 -  Suicidal thoughts 0 0 -  PHQ-9 Score 11 3 -  Difficult doing work/chores Not difficult at all - -  Some recent data might be hidden     GAD-7:  GAD 7 : Generalized Anxiety Score 05/20/2020  05/16/2018 04/24/2018 05/25/2016  Nervous, Anxious, on Edge 1 1 1  0  Control/stop worrying 0 1 1 1   Worry too much - different things 1 1 1 1   Trouble relaxing 1 2 2 1   Restless 0 0 0 0  Easily annoyed or irritable 0 1 1 0  Afraid - awful might happen 0 0 0 0  Total GAD 7 Score 3 6 6 3   Anxiety Difficulty Not difficult at all Not difficult at all Not difficult at all Somewhat difficult     Physical Exam General: Awake, alert, oriented Cardiovascular: Regular rate and rhythm, S1 and S2 present, no murmurs auscultated Respiratory: Lung fields clear to auscultation bilaterally  ASSESSMENT/PLAN:   Diabetes mellitus, type II (HCC) Stable, at goal.  A1c today 7.2, down from 7.7 last measurement.  Current regimen Lantus 70 units daily, NovoLog 14 units a.m., 10 units nightly.  Doing well, no side effects, no lows recorded.  No changes to plan at this time.  Healthcare maintenance Administered bivalent COVID booster today, tolerated well.  GASTROESOPHAGEAL REFLUX, NO ESOPHAGITIS Acute.  Patient report new sour chalky taste in mouth that is worse in the mornings or after laying down.  Does report Tums as needed and Protonix daily.  We will stop Protonix and trial famotidine instead.  Hypertension Chronic.  At goal today.  Tolerating meds well, no side effects. We will collect BMP to assess creatinine on medications.     [name], MD [place]

## 2022-01-12 ENCOUNTER — Ambulatory Visit (INDEPENDENT_AMBULATORY_CARE_PROVIDER_SITE_OTHER): Payer: No Typology Code available for payment source

## 2022-01-12 ENCOUNTER — Encounter: Payer: Self-pay | Admitting: Family Medicine

## 2022-01-12 ENCOUNTER — Ambulatory Visit (INDEPENDENT_AMBULATORY_CARE_PROVIDER_SITE_OTHER): Payer: No Typology Code available for payment source | Admitting: Family Medicine

## 2022-01-12 ENCOUNTER — Other Ambulatory Visit: Payer: Self-pay

## 2022-01-12 VITALS — BP 142/56 | HR 75 | Ht 60.0 in | Wt 199.8 lb

## 2022-01-12 DIAGNOSIS — Z0000 Encounter for general adult medical examination without abnormal findings: Secondary | ICD-10-CM | POA: Diagnosis not present

## 2022-01-12 DIAGNOSIS — K219 Gastro-esophageal reflux disease without esophagitis: Secondary | ICD-10-CM

## 2022-01-12 DIAGNOSIS — Z23 Encounter for immunization: Secondary | ICD-10-CM | POA: Diagnosis not present

## 2022-01-12 DIAGNOSIS — I10 Essential (primary) hypertension: Secondary | ICD-10-CM

## 2022-01-12 DIAGNOSIS — Z794 Long term (current) use of insulin: Secondary | ICD-10-CM | POA: Diagnosis not present

## 2022-01-12 DIAGNOSIS — E1143 Type 2 diabetes mellitus with diabetic autonomic (poly)neuropathy: Secondary | ICD-10-CM | POA: Diagnosis not present

## 2022-01-12 LAB — POCT GLYCOSYLATED HEMOGLOBIN (HGB A1C): HbA1c, POC (controlled diabetic range): 7.2 % — AB (ref 0.0–7.0)

## 2022-01-12 MED ORDER — FAMOTIDINE 20 MG PO TABS: 20.0000 mg | ORAL_TABLET | Freq: Two times a day (BID) | ORAL | 2 refills | Status: DC

## 2022-01-12 NOTE — Assessment & Plan Note (Addendum)
Chronic.  At goal today.  Tolerating meds well, no side effects. We will collect BMP to assess creatinine on medications.

## 2022-01-12 NOTE — Assessment & Plan Note (Signed)
Administered bivalent COVID booster today, tolerated well.

## 2022-01-12 NOTE — Assessment & Plan Note (Signed)
Acute.  Patient report new sour chalky taste in mouth that is worse in the mornings or after laying down.  Does report Tums as needed and Protonix daily.  We will stop Protonix and trial famotidine instead.

## 2022-01-12 NOTE — Assessment & Plan Note (Signed)
Stable, at goal.  A1c today 7.2, down from 7.7 last measurement.  Current regimen Lantus 70 units daily, NovoLog 14 units a.m., 10 units nightly.  Doing well, no side effects, no lows recorded.  No changes to plan at this time.

## 2022-01-13 ENCOUNTER — Telehealth: Payer: Self-pay | Admitting: Family Medicine

## 2022-01-13 ENCOUNTER — Encounter: Payer: Self-pay | Admitting: Family Medicine

## 2022-01-13 DIAGNOSIS — E1143 Type 2 diabetes mellitus with diabetic autonomic (poly)neuropathy: Secondary | ICD-10-CM

## 2022-01-13 DIAGNOSIS — N183 Chronic kidney disease, stage 3 unspecified: Secondary | ICD-10-CM | POA: Insufficient documentation

## 2022-01-13 DIAGNOSIS — N1831 Chronic kidney disease, stage 3a: Secondary | ICD-10-CM

## 2022-01-13 DIAGNOSIS — I5032 Chronic diastolic (congestive) heart failure: Secondary | ICD-10-CM

## 2022-01-13 LAB — BASIC METABOLIC PANEL
BUN/Creatinine Ratio: 28 (ref 12–28)
BUN: 31 mg/dL — ABNORMAL HIGH (ref 8–27)
CO2: 24 mmol/L (ref 20–29)
Calcium: 9.5 mg/dL (ref 8.7–10.3)
Chloride: 103 mmol/L (ref 96–106)
Creatinine, Ser: 1.09 mg/dL — ABNORMAL HIGH (ref 0.57–1.00)
Glucose: 221 mg/dL — ABNORMAL HIGH (ref 70–99)
Potassium: 4.7 mmol/L (ref 3.5–5.2)
Sodium: 141 mmol/L (ref 134–144)
eGFR: 51 mL/min/{1.73_m2} — ABNORMAL LOW (ref >59)

## 2022-01-13 MED ORDER — EMPAGLIFLOZIN 10 MG PO TABS: 10.0000 mg | ORAL_TABLET | Freq: Every day | ORAL | 2 refills | Status: DC

## 2022-01-13 NOTE — Telephone Encounter (Signed)
Called patient to discuss creatinine on labs from yesterday. Currently at baseline. However, upon chart review is appears that her creatinine has ranged from 0.8-1.18 in the last three years and that she has CKD3a (though not on her problem list yet).   Would ask her to come in to submit urine studies for microalbumin and P:C ratio. Future orders placed.   KDIGO recommendations below: - SGLT2 - metformin - RAS inhibition with ACE/ARB (if demonstrates microalbuminuria)  Current T2DM management: Lantus 70 units daily, Novolog 14u AM + 10u QHS Current HLD management: lovastatin 40 Current HTN & HFpEF management: amlodipine 10 daily+ 2.5 PRN for sHTN>150, valsartan 320, torsemide 20, spironolactone 25 mg  On the phone, Rebecca Walter declines metformin at this time due to chronic diarrhea. She is amenable to starting SGLT2. Rx sent. Chose Jardiance due to ability to increase dose from 10 mg to 25 mg for glycemic control with her current GFR.   [name], MD

## 2022-01-14 ENCOUNTER — Telehealth: Payer: Self-pay | Admitting: Pharmacist

## 2022-01-14 MED ORDER — OZEMPIC (0.25 OR 0.5 MG/DOSE) 2 MG/1.5ML ~~LOC~~ SOPN: 0.2500 mg | PEN_INJECTOR | SUBCUTANEOUS | 0 refills | Status: DC

## 2022-01-14 NOTE — Telephone Encounter (Signed)
Sent in [place] for [place]. I must have mixed up the medications when counseling her on the phone yesterday. The SGLT2 is recommended as first line for CKD and diabetes, but the GLP-1s are also acceptable based on KDIGO guidelines.   [name], MD

## 2022-01-14 NOTE — Telephone Encounter (Signed)
Patient called office with questions RE the initiation of SGLT-2 therapy.   Discussed how this medication appeared to be appropriate, what we should expect from laboratory changes/benefits over time AND side effects to monitor for including infections.   Patient verbalized understanding of all information discussed and agreed with initiation of Jardiance at this time.  She was confused that there was also another prescription waiting at her pharmacy for pick-up tomorrow.  I asked her to call our office with questions as needed.    Total time with patient call and documentation of interaction: 13 minutes.  F/U Phone call planned: 2 weeks.  She was open to having a phone follow-up 01/28/2022.

## 2022-01-14 NOTE — Telephone Encounter (Signed)
Patient calls nurse line reporting she picked up Jardiance, however due to kidney side effects does not feel comfortable taking.   Patient reports she spoke with PCP yesterday and was told she would be doing an injection vs a pill form of medication.   Patient is requesting PCP to call her to discuss options. Patient reports she will not be starting Jardiance.

## 2022-01-14 NOTE — Telephone Encounter (Signed)
Noted and agree. 

## 2022-01-14 NOTE — Addendum Note (Signed)
Addended by: Renard Hamper on: 01/14/2022 01:28 PM   Modules accepted: Orders

## 2022-01-28 ENCOUNTER — Other Ambulatory Visit: Payer: Self-pay

## 2022-01-28 ENCOUNTER — Telehealth (INDEPENDENT_AMBULATORY_CARE_PROVIDER_SITE_OTHER): Payer: No Typology Code available for payment source | Admitting: Pharmacist

## 2022-01-28 DIAGNOSIS — Z794 Long term (current) use of insulin: Secondary | ICD-10-CM

## 2022-01-28 DIAGNOSIS — E1143 Type 2 diabetes mellitus with diabetic autonomic (poly)neuropathy: Secondary | ICD-10-CM

## 2022-01-28 NOTE — Patient Instructions (Signed)
Great to talk to you today.   No change in insulin therapy today.  -Continue insulins at same doses.  - Continue Jardiance Plan to follow-up with Dr. Jeani Hawking in 1-2 months.  Please schedule to make appointment.

## 2022-01-28 NOTE — Progress Notes (Signed)
Reviewed: I agree with Dr. Koval's documentation and management. 

## 2022-01-28 NOTE — Progress Notes (Signed)
Virtual Visit via Telephone Note  I connected with Rebecca Walter on 01/28/22 at  1:30 PM EST by telephone and verified that I am speaking with the correct person using two identifiers.  Location: Patient: at home Provider: [place]   I discussed the limitations, risks, security and privacy concerns of performing an evaluation and management service by telephone and the availability of in person appointments. I also discussed with the patient that there may be a patient responsible charge related to this service. The patient expressed understanding and agreed to proceed.  S    Chief Complaint  Patient presents with   Medication Management    Diabetes - Post SGLT2 start   Rebecca Walter is a 81 y.o. female contacted for diabetes evaluation, education, and management. Patient was referred and last seen by Primary Care Provider, Dr. Jeani Hawking, on 01/12/2022.   Current diabetes medications include: Lantus (insulin glargine) 64 units daily, Novolog (insulin aspart) Flexpen 14 units twice daily and Jardiance (empagliflozin) 10 mg daily  Patient states that She is taking her medications as prescribed. Patient reports adherence with medications. Patient states that She misses her medications rately.  Do you feel that your medications are working for you? yes Have you been experiencing any side effects to the medications prescribed? yes Do you have any problems obtaining medications due to transportation or finances? no   Patient denies hypoglycemic events.  Reported home fasting blood sugars: 187,171,132,110,170,189, 121, 115,2 40, 200, 130,   Reported 2 hour post-meal/random blood sugars: None.   Patient reported dietary habits: Eats 3 meals/day Breakfast: larger meal Lunch: small meal Dinner: larger meal  O: Telephone Encounter Review of Systems  Gastrointestinal:  Positive for heartburn (taste disturbance).  - seeing [facility]   Lab Results  Component Value Date    HGBA1C 7.2 (A) 01/12/2022   Lipid Panel     Component Value Date/Time   CHOL 207 (H) 06/09/2021 1632   TRIG 362 (H) 06/09/2021 1632   HDL 35 (L) 06/09/2021 1632   CHOLHDL 5.9 (H) 06/09/2021 1632   CHOLHDL 4.2 07/16/2017 0402   VLDL 50 (H) 07/16/2017 0402   LDLCALC 110 (H) 06/09/2021 1632   LDLDIRECT 87 09/10/2021 1059   LDLDIRECT 103 (H) 03/11/2010 2127     A/P: Diabetes longstanding and currently improved with addition of SGLT2 and insulin dose adjustment. Patient is able to verbalize appropriate hypoglycemia management plan. Medication adherence appears excellent. Control is suboptimal due to stress, anxiety. -Continued basal insulin Lantus  (insulin glargine) 64 units daily. . -Continued rapid insulin Novolog (insulin aspart) at 14 units with sliding scale.  -Continued SGLT2-I Jardiance (empagliflozin) 10mg  daily.  No side effects reported.  -Extensively discussed pathophysiology of diabetes, recommended lifestyle interventions, dietary effects on blood sugar control.  -Counseled on s/sx of and management of hypoglycemia.  -Next A1c anticipated 2-3 months.    I discussed the assessment and treatment plan with the patient. The patient was provided an opportunity to ask questions and all were answered. The patient agreed with the plan and demonstrated an understanding of the instructions.   The patient was advised to call back or seek an in-person evaluation if the symptoms worsen or if the condition fails to improve as anticipated.  I provided 14 minutes of non-face-to-face time during this encounter.  Follow up PCP clinic visit in 1-2 months suggested - Patient will call office to make appointment.  Patient seen with Gala Murdoch, PharmD Candidate, and Rebbeca Paul,  PharmD - PGY2 Pharmacy Resident.

## 2022-01-28 NOTE — Assessment & Plan Note (Signed)
Diabetes longstanding and currently improved with addition of SGLT2 and insulin dose adjustment. Patient is able to verbalize appropriate hypoglycemia management plan. Medication adherence appears excellent. Control is suboptimal due to stress, anxiety. -Continued basal insulin Lantus  (insulin glargine) 64 units daily. . -Continued rapid insulin Novolog (insulin aspart) at 14 units with sliding scale.  -Continued SGLT2-I Jardiance (empagliflozin) 10mg  daily.  No side effects reported.  -Extensively discussed pathophysiology of diabetes, recommended lifestyle interventions, dietary effects on blood sugar control.  -Counseled on s/sx of and management of hypoglycemia.  -Next A1c anticipated 2-3 months.

## 2022-02-01 ENCOUNTER — Other Ambulatory Visit: Payer: Self-pay | Admitting: *Deleted

## 2022-02-01 ENCOUNTER — Other Ambulatory Visit: Payer: Self-pay

## 2022-02-01 DIAGNOSIS — I10 Essential (primary) hypertension: Secondary | ICD-10-CM

## 2022-02-01 DIAGNOSIS — F419 Anxiety disorder, unspecified: Secondary | ICD-10-CM

## 2022-02-01 MED ORDER — SERTRALINE HCL 50 MG PO TABS: ORAL_TABLET | ORAL | 2 refills | Status: DC

## 2022-02-01 MED ORDER — AMLODIPINE BESYLATE 10 MG PO TABS: 10.0000 mg | ORAL_TABLET | Freq: Every day | ORAL | 3 refills | Status: DC

## 2022-02-02 ENCOUNTER — Telehealth: Payer: No Typology Code available for payment source | Admitting: Pharmacist

## 2022-02-09 ENCOUNTER — Ambulatory Visit (INDEPENDENT_AMBULATORY_CARE_PROVIDER_SITE_OTHER): Payer: No Typology Code available for payment source | Admitting: Podiatry

## 2022-02-09 ENCOUNTER — Other Ambulatory Visit: Payer: Self-pay

## 2022-02-09 ENCOUNTER — Encounter: Payer: Self-pay | Admitting: Podiatry

## 2022-02-09 DIAGNOSIS — E1143 Type 2 diabetes mellitus with diabetic autonomic (poly)neuropathy: Secondary | ICD-10-CM | POA: Diagnosis not present

## 2022-02-09 DIAGNOSIS — Z794 Long term (current) use of insulin: Secondary | ICD-10-CM

## 2022-02-09 DIAGNOSIS — B351 Tinea unguium: Secondary | ICD-10-CM | POA: Diagnosis not present

## 2022-02-09 DIAGNOSIS — M79675 Pain in left toe(s): Secondary | ICD-10-CM | POA: Diagnosis not present

## 2022-02-09 DIAGNOSIS — M79674 Pain in right toe(s): Secondary | ICD-10-CM

## 2022-02-10 ENCOUNTER — Other Ambulatory Visit (HOSPITAL_COMMUNITY): Payer: Self-pay

## 2022-02-10 ENCOUNTER — Telehealth: Payer: Self-pay | Admitting: *Deleted

## 2022-02-10 NOTE — Telephone Encounter (Signed)
Opened in error.Kritika Stukes Zimmerman Rumple, CMA  

## 2022-02-11 ENCOUNTER — Telehealth: Payer: Self-pay

## 2022-02-11 DIAGNOSIS — Z794 Long term (current) use of insulin: Secondary | ICD-10-CM

## 2022-02-11 DIAGNOSIS — N1831 Chronic kidney disease, stage 3a: Secondary | ICD-10-CM

## 2022-02-11 DIAGNOSIS — E1143 Type 2 diabetes mellitus with diabetic autonomic (poly)neuropathy: Secondary | ICD-10-CM

## 2022-02-11 DIAGNOSIS — I5032 Chronic diastolic (congestive) heart failure: Secondary | ICD-10-CM

## 2022-02-11 NOTE — Telephone Encounter (Signed)
Patient calls nurse line regarding cost of Jardiance. Patient's out of pocket cost is $45. Patient reports that this is too expensive and would like to be prescribed an alternative. Called pharmacist and verified that this cost is with insurance coverage. Medication does not need a PA.   Please advise.   Talbot Grumbling, RN

## 2022-02-12 ENCOUNTER — Other Ambulatory Visit (HOSPITAL_COMMUNITY): Payer: Self-pay

## 2022-02-12 MED ORDER — DAPAGLIFLOZIN PROPANEDIOL 10 MG PO TABS: 10.0000 mg | ORAL_TABLET | Freq: Every day | ORAL | 3 refills | Status: DC

## 2022-02-12 NOTE — Telephone Encounter (Signed)
Will DC Jardiance and trial Farxiga 10 mg daily. Sent to pharmacy.  [name], MD

## 2022-02-12 NOTE — Telephone Encounter (Signed)
Patient calls nurse line reporting Rebecca Walter is ~130 dollars. Patient reports she can not afford this.   Patient reports the pharmacy was planning to reach out to [place]. I assuming a prior authorization.   Will forward to [name] to see if she has received covermymeds fax code form.

## 2022-02-14 NOTE — Progress Notes (Signed)
Subjective: Rebecca Walter is a 81 y.o. female patient seen today for follow up of  at risk foot care with history of diabetic neuropathy and painful elongated mycotic toenails 1-5 bilaterally which are tender when wearing enclosed shoe gear. Pain is relieved with periodic professional debridement..   Patient states blood glucose was 154 mg/dl today.   Patient did see Dr. Paulla Dolly for matrixectomy of medial border of left great toe. She states her toe feels much better now.  PCP is [name], MD. Last visit was: 01/12/2022.  Allergies  Allergen Reactions   Acyclovir     Other reaction(s): diarrhea   Baclofen Other (See Comments)    Caused leg weakness   Hydrochlorothiazide Other (See Comments)    Uric acid elevation and gout.      Liraglutide Other (See Comments)    Tongue Glossitus   Losartan Potassium     Other reaction(s): diarrhea   Lotensin [Benazepril] Other (See Comments)    Dry cough    Benazepril Hcl     Other reaction(s): dry cough   Metoprolol Succinate [Metoprolol]     Other reaction(s): coughing   Beta Adrenergic Blockers Other (See Comments)    Occurred with metoprolol  coughing   Other Diarrhea and Other (See Comments)    Occurred with metoprolol  REACTION: dry coughing    Objective: Physical Exam  General: Patient is a pleasant 81 y.o. Caucasian female in NAD. AAO x 3.   Neurovascular Examination: Capillary refill time to digits immediate b/l. Palpable pedal pulses b/l LE. Pedal hair sparse. No pain with calf compression b/l. Lower extremity skin temperature gradient within normal limits. No edema noted b/l LE. No cyanosis or clubbing noted b/l LE.  Protective sensation intact 5/5 intact bilaterally with 10g monofilament b/l. Vibratory sensation intact b/l.  Dermatological:  Pedal integument with normal turgor, texture and tone BLE. No open wounds b/l LE. No interdigital macerations noted b/l LE. Toenails 1-5 b/l elongated, discolored, dystrophic,  thickened, crumbly with subungual debris and tenderness to dorsal palpation. Procedure site of medial border left hallux noted to be completely healed with no erythema, no edema, no drainage, no purulence.   Musculoskeletal:  Normal muscle strength 5/5 to all lower extremity muscle groups bilaterally. Hammertoe(s) noted to the bilateral 2nd toes.. No pain, crepitus or joint limitation noted with ROM b/l LE.  Patient ambulates independently without assistive aids.  Assessment: 1. Pain due to onychomycosis of toenails of both feet   2. Type 2 diabetes mellitus with diabetic autonomic neuropathy, with long-term current use of insulin ([place])    Plan: Patient was evaluated and treated and all questions answered. Consent given for treatment as described below: -Mycotic toenails 1-5 bilaterally were debrided in length and girth with sterile nail nippers and dremel without incident. -Patient/POA to call should there be question/concern in the interim.  Return in about 3 months (around 05/09/2022).  Marzetta Board, DPM

## 2022-02-15 ENCOUNTER — Telehealth: Payer: Self-pay

## 2022-02-15 ENCOUNTER — Other Ambulatory Visit (HOSPITAL_COMMUNITY): Payer: Self-pay

## 2022-02-15 DIAGNOSIS — E1143 Type 2 diabetes mellitus with diabetic autonomic (poly)neuropathy: Secondary | ICD-10-CM

## 2022-02-15 NOTE — Telephone Encounter (Signed)
Spoke with pt about applying for low income subsidy/extra help thru [employer] to possibly help with her medication copays. Informed pt that if shes denied (which she said she was years ago) then she would qualify for patient assistance for BI CARES for her jardiance. Pt expressed thanks and understanding and will reach back out to me once shes done so.

## 2022-02-15 NOTE — Telephone Encounter (Signed)
Received the following Rx request from [facility]  T/S Accu-Chk AVIVA Plus Check blood sugar first thing in the morning before eating, after lunch and after dinner. A total of 3 times daily.  6 refills @ 50 ea=300 each total  Ottis Stain, CMA

## 2022-02-16 MED ORDER — GLUCOSE BLOOD VI STRP: ORAL_STRIP | 6 refills | Status: DC

## 2022-02-16 NOTE — Addendum Note (Signed)
Addended by: Renard Hamper on: 02/16/2022 12:52 PM   Modules accepted: Orders

## 2022-02-22 ENCOUNTER — Telehealth: Payer: Self-pay

## 2022-02-22 DIAGNOSIS — F419 Anxiety disorder, unspecified: Secondary | ICD-10-CM

## 2022-02-22 NOTE — Telephone Encounter (Signed)
Please resend Rx for Sertraline to [facility] on [address]. It was not received by pharmacy. May have been set as print.  ?Ottis Stain, CMA ? ?

## 2022-02-24 MED ORDER — SERTRALINE HCL 50 MG PO TABS: ORAL_TABLET | ORAL | 4 refills | Status: DC

## 2022-02-24 MED ORDER — SERTRALINE HCL 100 MG PO TABS: 100.0000 mg | ORAL_TABLET | Freq: Every day | ORAL | 4 refills | Status: DC

## 2022-02-24 NOTE — Addendum Note (Signed)
Addended by: Renard Hamper on: 02/24/2022 05:32 PM ? ? Modules accepted: Orders ? ?

## 2022-02-24 NOTE — Telephone Encounter (Signed)
Sertraline scripts resent to pharmacy. Yes, previous script was erroneously set to print instead of electronic sending.  ?[name], MD ? ?

## 2022-02-26 ENCOUNTER — Other Ambulatory Visit: Payer: Self-pay | Admitting: Family Medicine

## 2022-02-27 ENCOUNTER — Other Ambulatory Visit: Payer: Self-pay | Admitting: Family Medicine

## 2022-02-27 DIAGNOSIS — E1169 Type 2 diabetes mellitus with other specified complication: Secondary | ICD-10-CM

## 2022-02-27 DIAGNOSIS — E785 Hyperlipidemia, unspecified: Secondary | ICD-10-CM

## 2022-03-20 ENCOUNTER — Other Ambulatory Visit: Payer: Self-pay | Admitting: Family Medicine

## 2022-03-20 DIAGNOSIS — E1143 Type 2 diabetes mellitus with diabetic autonomic (poly)neuropathy: Secondary | ICD-10-CM

## 2022-03-24 ENCOUNTER — Ambulatory Visit: Payer: No Typology Code available for payment source | Admitting: Family Medicine
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
## 2022-03-30 ENCOUNTER — Other Ambulatory Visit: Payer: Self-pay | Admitting: Family Medicine

## 2022-03-31 ENCOUNTER — Other Ambulatory Visit: Payer: Self-pay | Admitting: Family Medicine

## 2022-03-31 ENCOUNTER — Encounter: Payer: Self-pay | Admitting: Family Medicine

## 2022-03-31 ENCOUNTER — Ambulatory Visit (INDEPENDENT_AMBULATORY_CARE_PROVIDER_SITE_OTHER): Payer: No Typology Code available for payment source | Admitting: Family Medicine

## 2022-03-31 VITALS — BP 120/58 | HR 61 | Wt 201.0 lb

## 2022-03-31 DIAGNOSIS — M170 Bilateral primary osteoarthritis of knee: Secondary | ICD-10-CM | POA: Diagnosis not present

## 2022-03-31 DIAGNOSIS — E785 Hyperlipidemia, unspecified: Secondary | ICD-10-CM

## 2022-03-31 DIAGNOSIS — I10 Essential (primary) hypertension: Secondary | ICD-10-CM

## 2022-03-31 DIAGNOSIS — M25561 Pain in right knee: Secondary | ICD-10-CM

## 2022-03-31 DIAGNOSIS — F5101 Primary insomnia: Secondary | ICD-10-CM

## 2022-03-31 DIAGNOSIS — M25562 Pain in left knee: Secondary | ICD-10-CM | POA: Diagnosis not present

## 2022-03-31 DIAGNOSIS — R69 Illness, unspecified: Secondary | ICD-10-CM | POA: Diagnosis not present

## 2022-03-31 MED ORDER — RAMELTEON 8 MG PO TABS: 8.0000 mg | ORAL_TABLET | Freq: Every day | ORAL | 0 refills | Status: DC

## 2022-03-31 MED ORDER — DICLOFENAC SODIUM 1 % EX GEL: 2.0000 g | Freq: Three times a day (TID) | CUTANEOUS | 3 refills | Status: DC

## 2022-03-31 NOTE — Patient Instructions (Addendum)
It was wonderful to see you today. Thank you for allowing me to be a part of your care. Below is a short summary of what we discussed at your visit today: ? ?Bone pain ?Voltaren gel three times daily for several weeks in a row. You should start feeling relief in several days.  ? ?Cholesterol ?Today we got a lab test to check on your cholesterol. If the results are normal, I will send you a letter or MyChart message. If the results are abnormal, I will give you a call.   ? ?Diabetes ?Your next A1c check will be in the summer.  ? ?Difficulty sleeping ?Today we discussed your difficulty sleeping. I recommend two things to start: ramelteon and a regular wake up time.  ? ?Ramelteon: Take one 8 mg tablet about 30 minutes before bed. I recommend taking it before you move to the bedroom to put on pajamas and brush your teeth.  ? ?Regular wake up time: Wake up at the same time every day and do not take day time naps.  ? ?See below for more tips and web sites with reliable information for you.  ? ?If you have any questions or concerns, please do not hesitate to contact us via phone or MyChart message.  ? ?[name], MD  ? ?CDC tips for better sleep ?http://www.davis-wright.info/ ? ? ? ? ?American Society of Sleep Medicine  ?https://sleepeducation.org/healthy-sleep/healthy-sleep-habits/ ? ? ? ?Healthy Sleep Habits ?Your behaviors during the day, and especially before bedtime, can have a major impact on your sleep. They can promote healthy sleep or contribute to sleeplessness. ? ?Your daily routines - what you eat and drink, the medications you take, how you schedule your days and how you choose to spend your evenings - can significantly impact your quality of sleep. Even a few slight adjustments can, in some cases, mean the difference between sound sleep and a restless night. ? ?The term ?sleep hygiene? refers to a series of healthy sleep habits that can improve your ability to fall asleep  and stay asleep. These habits can help improve your sleep health.  When people struggle with insomnia, sleep hygiene is an important part of cognitive behavioral therapy (CBT), the most effective long-term treatment for people with chronic insomnia. CBT for insomnia can help you address the thoughts and behaviors that prevent you from sleeping well. It also includes techniques for stress reduction, relaxation and sleep schedule management. ? ?If you have difficulty sleeping or want to improve your sleep, try following these healthy sleep habits. Talk to your medical provider if your sleep problem persists. You also can seek help from the sleep team at an AASM accredited sleep center. ? ?Quick sleep tips ?Keep a consistent sleep schedule. Get up at the same time every day, even on weekends or during vacations. ?Set a bedtime that is early enough for you to get at least 7-8 hours of sleep. ?Don?t go to bed unless you are sleepy. ?If you don?t fall asleep after 20 minutes, get out of bed. Go do a quiet activity without a lot of light exposure. It is especially important to not get on electronics. ?Establish a relaxing bedtime routine. ?Use your bed only for sleep and sex. ?Make your bedroom quiet and relaxing. Keep the room at a comfortable, cool temperature. ?Limit exposure to bright light in the evenings. ?Turn off electronic devices at least 30 minutes before bedtime. ?Don?t eat a large meal before bedtime. If you are hungry at night, eat a light,  healthy snack. ?Exercise regularly and maintain a healthy diet. ?Avoid consuming caffeine in the afternoon or evening. ?Avoid consuming alcohol before bedtime. ?Reduce your fluid intake before bedtime.  ? ? ?Please bring all of your medications to every appointment! ? ?If you have any questions or concerns, please do not hesitate to contact us via phone or MyChart message.  ? ?[name], MD  ?

## 2022-04-01 LAB — LIPID PANEL
Chol/HDL Ratio: 4 ratio (ref 0.0–4.4)
Cholesterol, Total: 188 mg/dL (ref 100–199)
HDL: 47 mg/dL (ref >39)
LDL Chol Calc (NIH): 101 mg/dL — ABNORMAL HIGH (ref 0–99)
Triglycerides: 233 mg/dL — ABNORMAL HIGH (ref 0–149)
VLDL Cholesterol Cal: 40 mg/dL (ref 5–40)

## 2022-04-02 ENCOUNTER — Other Ambulatory Visit: Payer: Self-pay | Admitting: Family Medicine

## 2022-04-02 DIAGNOSIS — G4700 Insomnia, unspecified: Secondary | ICD-10-CM

## 2022-04-02 MED ORDER — DOXEPIN HCL 6 MG PO TABS: 1.0000 | ORAL_TABLET | Freq: Every evening | ORAL | 2 refills | Status: DC | PRN

## 2022-04-04 DIAGNOSIS — M25562 Pain in left knee: Secondary | ICD-10-CM

## 2022-04-04 DIAGNOSIS — M25561 Pain in right knee: Secondary | ICD-10-CM

## 2022-04-04 HISTORY — DX: Pain in left knee: M25.562

## 2022-04-04 HISTORY — DX: Pain in right knee: M25.561

## 2022-04-04 NOTE — Assessment & Plan Note (Addendum)
Subacute exacerbation of chronic issue.  No recent trauma or joint instability.  Rx Voltaren cream to use.  Return precautions given, see AVS for more. ?

## 2022-04-04 NOTE — Assessment & Plan Note (Signed)
Chronic.  Discussed sleep hygiene, habits.  Handout given.  Rx ramelteon, however not covered by insurance.  Rx alternative doxepin 6 mg.  Return precautions, see AVS for more. ?

## 2022-04-04 NOTE — Assessment & Plan Note (Signed)
Controlled.  No changes to regimen at this time.  Return precautions given. ?

## 2022-04-08 ENCOUNTER — Other Ambulatory Visit: Payer: Self-pay | Admitting: Family Medicine

## 2022-04-08 DIAGNOSIS — K219 Gastro-esophageal reflux disease without esophagitis: Secondary | ICD-10-CM

## 2022-04-10 ENCOUNTER — Other Ambulatory Visit: Payer: Self-pay | Admitting: Family Medicine

## 2022-04-10 DIAGNOSIS — N1831 Chronic kidney disease, stage 3a: Secondary | ICD-10-CM

## 2022-04-10 DIAGNOSIS — I5032 Chronic diastolic (congestive) heart failure: Secondary | ICD-10-CM

## 2022-04-10 DIAGNOSIS — E1143 Type 2 diabetes mellitus with diabetic autonomic (poly)neuropathy: Secondary | ICD-10-CM

## 2022-04-15 ENCOUNTER — Encounter: Payer: Self-pay | Admitting: Family Medicine

## 2022-04-15 ENCOUNTER — Ambulatory Visit (INDEPENDENT_AMBULATORY_CARE_PROVIDER_SITE_OTHER): Payer: No Typology Code available for payment source | Admitting: Family Medicine

## 2022-04-15 VITALS — BP 128/60 | HR 78 | Ht 60.0 in | Wt 202.8 lb

## 2022-04-15 DIAGNOSIS — R441 Visual hallucinations: Secondary | ICD-10-CM | POA: Insufficient documentation

## 2022-04-15 DIAGNOSIS — R69 Illness, unspecified: Secondary | ICD-10-CM | POA: Diagnosis not present

## 2022-04-15 NOTE — Patient Instructions (Addendum)
It was wonderful to see you today. Thank you for allowing me to be a part of your care. Below is a short summary of what we discussed at your visit today: ? ?Sleep trouble  Visual hallucinations ?Stop the doxepin ?Reduce your flexeril usage ?Come back in 2-3 weeks for a follow up ?Book an appointment with our geriatric clinic  ?I am ordering a head MRI for you, we will call you once insurance approves this and it is scheduled ?Fill out the sleep questionnaire I gave you today and bring it to your follow up appointment ? ?Today we got some blood work. If the results are normal, I will send you a letter or MyChart message. If the results are abnormal, I will give you a call.   ? ?Seek out a Social worker, resources below: ?For information on therapists, please go to www.[email or web address].com.ee. You can also contact your insurance company to find an in-network therapist. ? ? ?Please bring all of your medications to every appointment! ? ?If you have any questions or concerns, please do not hesitate to contact us via phone or MyChart message.  ? ?[name], MD  ?

## 2022-04-16 ENCOUNTER — Telehealth: Payer: Self-pay | Admitting: Family Medicine

## 2022-04-16 DIAGNOSIS — R799 Abnormal finding of blood chemistry, unspecified: Secondary | ICD-10-CM

## 2022-04-16 DIAGNOSIS — R7989 Other specified abnormal findings of blood chemistry: Secondary | ICD-10-CM

## 2022-04-16 LAB — CBC WITH DIFFERENTIAL/PLATELET
Basophils Absolute: 0.1 10*3/uL (ref 0.0–0.2)
Basos: 1 %
EOS (ABSOLUTE): 0.4 10*3/uL (ref 0.0–0.4)
Eos: 4 %
Hematocrit: 40.4 % (ref 34.0–46.6)
Hemoglobin: 13.7 g/dL (ref 11.1–15.9)
Immature Grans (Abs): 0.1 10*3/uL (ref 0.0–0.1)
Immature Granulocytes: 1 %
Lymphocytes Absolute: 2.1 10*3/uL (ref 0.7–3.1)
Lymphs: 22 %
MCH: 31.3 pg (ref 26.6–33.0)
MCHC: 33.9 g/dL (ref 31.5–35.7)
MCV: 92 fL (ref 79–97)
Monocytes Absolute: 0.9 10*3/uL (ref 0.1–0.9)
Monocytes: 10 %
Neutrophils Absolute: 6.2 10*3/uL (ref 1.4–7.0)
Neutrophils: 62 %
Platelets: 203 10*3/uL (ref 150–450)
RBC: 4.38 x10E6/uL (ref 3.77–5.28)
RDW: 13 % (ref 11.7–15.4)
WBC: 9.7 10*3/uL (ref 3.4–10.8)

## 2022-04-16 LAB — COMPREHENSIVE METABOLIC PANEL
ALT: 38 IU/L — ABNORMAL HIGH (ref 0–32)
AST: 30 IU/L (ref 0–40)
Albumin/Globulin Ratio: 1.5 (ref 1.2–2.2)
Albumin: 4.6 g/dL (ref 3.6–4.6)
Alkaline Phosphatase: 57 IU/L (ref 44–121)
BUN/Creatinine Ratio: 27 (ref 12–28)
BUN: 36 mg/dL — ABNORMAL HIGH (ref 8–27)
Bilirubin Total: 0.2 mg/dL (ref 0.0–1.2)
CO2: 20 mmol/L (ref 20–29)
Calcium: 10 mg/dL (ref 8.7–10.3)
Chloride: 99 mmol/L (ref 96–106)
Creatinine, Ser: 1.32 mg/dL — ABNORMAL HIGH (ref 0.57–1.00)
Globulin, Total: 3 g/dL (ref 1.5–4.5)
Glucose: 216 mg/dL — ABNORMAL HIGH (ref 70–99)
Potassium: 4.6 mmol/L (ref 3.5–5.2)
Sodium: 137 mmol/L (ref 134–144)
Total Protein: 7.6 g/dL (ref 6.0–8.5)
eGFR: 40 mL/min/{1.73_m2} — ABNORMAL LOW (ref >59)

## 2022-04-16 LAB — FOLATE: Folate: >20 ng/mL (ref >3.0)

## 2022-04-16 LAB — TSH: TSH: 26.1 u[IU]/mL — ABNORMAL HIGH (ref 0.450–4.500)

## 2022-04-16 LAB — VITAMIN B12: Vitamin B-12: 877 pg/mL (ref 232–1245)

## 2022-04-16 MED ORDER — LEVOTHYROXINE SODIUM 25 MCG PO TABS: 25.0000 ug | ORAL_TABLET | Freq: Every day | ORAL | 3 refills | Status: DC

## 2022-04-16 NOTE — Telephone Encounter (Signed)
Attempted to call patient regarding lab results.  Three times in a row the phone was picked up, I could hear background noise including TV, but the line cut out before I could hear anyone speak.  The fourth call attempt went straight to voicemail, but said that her voicemail box was not yet set up and I could not leave a voicemail. ? ?Obtained labs after patient reported visual hallucinations nightly since February.  Thought to be an acute grief and stress reaction combined with worsening eyesight, however need to rule out metabolic, brain, and malignant etiologies. ? ?Folate and B12 normal.  CBC normal. ? ?TSH markedly high at 26.  Chart review reveals this is the highest we have ever measured her TSH.  Chart review indicates that she has never been on Synthroid.  I would like to start her on 25 mcg daily with a recheck in 1 month. Will attempt to add on a T4 measurement.  ? ?I would also like to recheck her CMP within about a week. Creatinine, ALT, and BUN all slightly bumped.  ? ?MRI brain without contrast ordered yesterday at visit.  Not yet scheduled, pending insurance approval. ? ?[name], MD ? ?

## 2022-04-18 ENCOUNTER — Telehealth: Payer: Self-pay | Admitting: Family Medicine

## 2022-04-18 NOTE — Telephone Encounter (Signed)
Called patient to discus lab results. Instructed her to take the levothyroxine 25 mcg daily without food. TSH recheck in 1 month.  ? ?Also discussed CMP results. Would like to recheck in one week or so. Patient prefers to recheck at our appointment scheduled for 5/16.  ? ?All questions answered.  ? ?[name], MD ? ?

## 2022-04-19 NOTE — Progress Notes (Signed)
? ? ?SUBJECTIVE:  ? ?CHIEF COMPLAINT / HPI:  ? ?Insomnia, visual hallucinations ?Patient presents today initially for discussion on insomnia.  Previously discussed at [facility] visit with myself 03/31/22, during which patient described trouble falling asleep and staying asleep has been happening intermittently for years.  Some relief with Tylenol PM.  Initially tried improved sleep hygiene and ramelteon, however this is not covered by her insurance.  I then prescribed doxepin 6 mg.   ? ?Today at follow-up, patient reports the doxepin is not helping her much.  She then goes on to disclose that the reason she is having difficulty staying asleep is because she is seeing people in her bedroom.  She reports that in February she had a friend go through a very hard time with some sort of medical diagnosis and that it was very stressful on her and quite emotional to help take care of this friend and her family.  During this emotionally stressful time is when these visual hallucinations began, and they have been gradually worsening.  Initially she started having visual hallucinations a couple nights each week and now it is every single night. ? ?She reports both visual and auditory hallucinations, but primarily visual.  She is usually tucked in bed try to go to sleep when she will look up and see a person in her doorway or in her room.  She says she does not look at their faces ever but that their bodies are quite detailed and look "like you are sitting right here in front of me now".  Denies threatening hallucinations or scary hallucinations, but does say that the fact she is seeing them frightens her.  A couple times, she has experienced visual hallucination of her son in front of her in her bedroom calling her by her first name (which is strange because it was called her mom and not Alisandra).  She reports sometimes she also has visual hallucinations of animals, for example she saw a visual hallucination of a dog in her armchair  but when she turned on the light she saw that it was a blanket on the chair. ? ?She does not have any aggravating factors.  The only relieving factor is pulling her night bonnet down over her eyes while she is try to fall asleep, which she reports works well for her.  No new medication changes.  Of note, these hallucinations only seem to happen at night. ? ?Denies fever, chills, muscle aches, headaches, vision changes, missing visual fields, diplopia, focal neurological deficit, recent falls. ? ?PERTINENT  PMH / PSH: G1DD, HTN, mild AS, mild MR, moderate pulmonary valve insufficiency, OSA, GERD, gastroparesis, T2DM, CKD stage III, HLD, insomnia, restless leg, chronic dry eyes ? ?OBJECTIVE:  ? ?BP 128/60   Pulse 78   Ht 5' (1.524 m)   Wt 202 lb 12.8 oz (92 kg)   LMP  (LMP Unknown)   SpO2 95%   BMI 39.61 kg/m?   ? ?PHQ-9:  ? ?  04/15/2022  ?  2:00 PM 03/31/2022  ?  2:03 PM 01/12/2022  ?  1:40 PM  ?Depression screen PHQ 2/9  ?Decreased Interest 0 1 1  ?Down, Depressed, Hopeless 0 0 1  ?PHQ - 2 Score 0 1 2  ?Altered sleeping '3 3 3  '$ ?Tired, decreased energy '1 1 3  '$ ?Change in appetite 0 1 3  ?Feeling bad or failure about yourself  0 0 0  ?Trouble concentrating 0 0 0  ?Moving slowly or fidgety/restless 0 0  0  ?Suicidal thoughts 0 0 0  ?PHQ-9 Score '4 6 11  '$ ?Difficult doing work/chores Very difficult  Not difficult at all  ?  ?Physical Exam ?General: Awake, alert, oriented ?HEENT: Sclera anicteric, EOM intact, PERRL ?Cardiovascular: Regular rate and rhythm, S1 and S2 present, no murmurs auscultated ?Respiratory: Lung fields clear to auscultation bilaterally ?Neuro: Cranial nerves II through X intact, grip strength and BUE 5/5 and equal bilaterally ? ?ASSESSMENT/PLAN:  ? ?Visual hallucination ?Subacute, 2 to 57-monthduration.  Gradually worsening and becoming more frequent.  Differential remains broad but includes acute grief reaction, medication induced hallucinations, dementia (particularly Lewy body dementia), brain  tumor, CVA, seizure activity, electrolyte disturbance, B12 deficiency, thyroid disorder.  We will obtain CBC with differential, CMP, folate, B12, and TSH at this visit.  We will order MR brain without contrast for evaluation of intracranial pathology.  No current medications likely to be culprit, thus no medication changes at this time.  [facility].  Consider neuropsych for testing in the future. ?  ? ?CEzequiel Essex MD ?[place] ?

## 2022-04-19 NOTE — Assessment & Plan Note (Signed)
Subacute, 2 to 13-monthduration.  Gradually worsening and becoming more frequent.  Differential remains broad but includes acute grief reaction, medication induced hallucinations, dementia (particularly Lewy body dementia), brain tumor, CVA, seizure activity, electrolyte disturbance, B12 deficiency, thyroid disorder.  We will obtain CBC with differential, CMP, folate, B12, and TSH at this visit.  We will order MR brain without contrast for evaluation of intracranial pathology.  No current medications likely to be culprit, thus no medication changes at this time.  [facility] another evaluation.  Consider neuropsych for testing in the future. ?

## 2022-04-21 LAB — T4, FREE: Free T4: 0.54 ng/dL — ABNORMAL LOW (ref 0.82–1.77)

## 2022-04-21 LAB — SPECIMEN STATUS REPORT

## 2022-04-23 NOTE — Telephone Encounter (Signed)
Error.  ?[name], MD ? ?

## 2022-05-04 ENCOUNTER — Encounter: Payer: Self-pay | Admitting: Family Medicine

## 2022-05-04 ENCOUNTER — Ambulatory Visit (INDEPENDENT_AMBULATORY_CARE_PROVIDER_SITE_OTHER): Payer: No Typology Code available for payment source | Admitting: Family Medicine

## 2022-05-04 VITALS — BP 167/67 | HR 81 | Ht 60.0 in | Wt 205.0 lb

## 2022-05-04 DIAGNOSIS — R7989 Other specified abnormal findings of blood chemistry: Secondary | ICD-10-CM

## 2022-05-04 DIAGNOSIS — E039 Hypothyroidism, unspecified: Secondary | ICD-10-CM | POA: Diagnosis not present

## 2022-05-04 DIAGNOSIS — G4700 Insomnia, unspecified: Secondary | ICD-10-CM

## 2022-05-04 DIAGNOSIS — R799 Abnormal finding of blood chemistry, unspecified: Secondary | ICD-10-CM | POA: Diagnosis not present

## 2022-05-04 DIAGNOSIS — R69 Illness, unspecified: Secondary | ICD-10-CM | POA: Diagnosis not present

## 2022-05-04 DIAGNOSIS — R441 Visual hallucinations: Secondary | ICD-10-CM

## 2022-05-04 NOTE — Patient Instructions (Signed)
It was wonderful to see you today. Thank you for allowing me to be a part of your care. Below is a short summary of what we discussed at your visit today: ? ?Sleep disturbances, visual hallucinations ?Today we will check your TSH to see how the thyroid medicine is doing for you. ? ?Keep taking your levothyroxine 25 mcg before food every day.  I will call you until you your TSH results and whether or not we need to adjust your levothyroxine medication. ? ?Go to your head MRI as scheduled on May 30.  They will send the results to me once the scan is read by radiologist. ? ?I have asked our staff to schedule you with our geriatric clinic by Dr. McDiarmid.  We will get you in as soon as possible.  This clinic includes Dr. Wendy Poet, the family medicine residents, Dr. Valentina Lucks the pharmacist, and pharmacy residents.  They will help [place] look at your med list comprehensively and do some neurocognitive testing. ? ?I am also sending you referral to neuropsychology.  It takes quite some time to get into their clinic so I am putting in the referral now.  They can help [place] with some more detailed testing to determine why you are having these visual and auditory hallucinations. ? ? ?If you have any questions or concerns, please do not hesitate to contact us via phone or MyChart message.  ? ?[name], MD  ?

## 2022-05-04 NOTE — Progress Notes (Signed)
? ? ?  SUBJECTIVE:  ? ?CHIEF COMPLAINT / HPI:  ? ?Insomnia, visual hallucinations ?Rebecca Walter was seen 4/27 by myself for insomnia from visual hallucinations.  At that time, lab work was collected.  CMP, CBC, B12, folate largely unremarkable.  TSH elevated to 26.100, the highest in her chart recorded.  Free T4 low at 0.54.  Patient instructed to start levothyroxine 25 mcg daily.  Today she reports she has been adherent to this but does not feel much different.  She is still having visual hallucinations.  She does note that previously if she pulled her bonnet down over her eyes at bedtime, these visual hallucinations would not appear.  Since last appointment, she has been sleeping with her large overnight light on; this provides relief from visual hallucinations at the onset of sleep, however she still wakes up in the middle of the night and has visions.  She reports she does not believe this is part of a dream.  The visual hallucinations are always of people or pets, she reports them being as real and vivid as if somebody was standing in front of her.  She reports one of her visual hallucinations being more disturbing recently, saying that she looked up to see "the naked behind of a man", as though a naked man had his back turned to her.  Another night, she hallucinated a nurse trying to give her a pill. She continues to deny any overtly threatening auditory visual hallucinations.  She is simply frightened by their presence, but they are not threatening to her or directing her to do anything harmful. ? ?PERTINENT  PMH / PSH: HFpEF 70 to 75%, hypertension, OSA, T2DM, CKD 3, insomnia, RLS ? ?OBJECTIVE:  ? ?BP (!) 167/67   Pulse 81   Ht 5' (1.524 m)   Wt 205 lb (93 kg)   LMP  (LMP Unknown)   SpO2 95%   BMI 40.04 kg/m?   ? ?Continue nightly.  PHQ-9:  ? ?  05/04/2022  ?  2:35 PM 04/15/2022  ?  2:00 PM 03/31/2022  ?  2:03 PM  ?Depression screen PHQ 2/9  ?Decreased Interest 1 0 1  ?Down, Depressed, Hopeless 1 0 0  ?PHQ  - 2 Score 2 0 1  ?Altered sleeping '3 3 3  '$ ?Tired, decreased energy '3 1 1  '$ ?Change in appetite 0 0 1  ?Feeling bad or failure about yourself  0 0 0  ?Trouble concentrating 0 0 0  ?Moving slowly or fidgety/restless 0 0 0  ?Suicidal thoughts 0 0 0  ?PHQ-9 Score '8 4 6  '$ ?Difficult doing work/chores Somewhat difficult Very difficult   ?  ?Physical Exam ?General: Awake, alert, oriented, no acute distress ?Respiratory: Unlabored respirations, speaking in full sentences, no respiratory distress ?Extremities: Moving all extremities spontaneously ?Neuro: Cranial nerves II through X grossly intact ?Psych: Normal insight and judgement  ? ?ASSESSMENT/PLAN:  ? ?Visual hallucination ?Continued nightly hallucinations.  Taking levothyroxine 25 mcg as prescribed.  We will repeat TSH and CMP today. MRI brain scheduled for 5/30.  She is on a wait list for [facility] clinic.  Refer for neuropsychiatry.  Consider referral to neurology or psychiatry (or both) after MRI results. ? ?Hypothyroidism ?Patient reports taking levothyroxine 25 mcg as prescribed.  Repletion has not resulted in reduction of visual hallucinations.  Repeat TSH today. ?  ? ? ?[name], MD ?[place]  ?

## 2022-05-05 DIAGNOSIS — E039 Hypothyroidism, unspecified: Secondary | ICD-10-CM | POA: Insufficient documentation

## 2022-05-05 LAB — COMPREHENSIVE METABOLIC PANEL
ALT: 31 IU/L (ref 0–32)
AST: 26 IU/L (ref 0–40)
Albumin/Globulin Ratio: 1.8 (ref 1.2–2.2)
Albumin: 4.6 g/dL (ref 3.6–4.6)
Alkaline Phosphatase: 56 IU/L (ref 44–121)
BUN/Creatinine Ratio: 21 (ref 12–28)
BUN: 26 mg/dL (ref 8–27)
Bilirubin Total: 0.2 mg/dL (ref 0.0–1.2)
CO2: 23 mmol/L (ref 20–29)
Calcium: 10 mg/dL (ref 8.7–10.3)
Chloride: 96 mmol/L (ref 96–106)
Creatinine, Ser: 1.24 mg/dL — ABNORMAL HIGH (ref 0.57–1.00)
Globulin, Total: 2.5 g/dL (ref 1.5–4.5)
Glucose: 322 mg/dL — ABNORMAL HIGH (ref 70–99)
Potassium: 5.2 mmol/L (ref 3.5–5.2)
Sodium: 136 mmol/L (ref 134–144)
Total Protein: 7.1 g/dL (ref 6.0–8.5)
eGFR: 43 mL/min/{1.73_m2} — ABNORMAL LOW (ref >59)

## 2022-05-05 LAB — TSH: TSH: 27.9 u[IU]/mL — ABNORMAL HIGH (ref 0.450–4.500)

## 2022-05-05 NOTE — Assessment & Plan Note (Addendum)
Continued nightly hallucinations.  Taking levothyroxine 25 mcg as prescribed.  We will repeat TSH and CMP today. MRI brain scheduled for 5/30.  She is on a wait list for [facility] clinic.  Refer for neuropsychiatry.  Consider referral to neurology or psychiatry (or both) after MRI results. ?

## 2022-05-05 NOTE — Assessment & Plan Note (Addendum)
Patient reports taking levothyroxine 25 mcg as prescribed.  Repletion has not resulted in reduction of visual hallucinations.  Repeat TSH today. ?

## 2022-05-07 ENCOUNTER — Telehealth: Payer: Self-pay | Admitting: Family Medicine

## 2022-05-07 DIAGNOSIS — E039 Hypothyroidism, unspecified: Secondary | ICD-10-CM

## 2022-05-07 MED ORDER — LEVOTHYROXINE SODIUM 50 MCG PO TABS: 50.0000 ug | ORAL_TABLET | ORAL | 3 refills | Status: DC

## 2022-05-07 NOTE — Telephone Encounter (Signed)
Called patient to discuss TSH results.   Thyroid TSH increased despite what appears to be optimal med adherence. Takes first thing in the morning with water only, waits 1-2 hours to eat. Will increase levothyroxine from 25 mcg to 50 mcg. Recheck appt scheduled with myself in one month.   Visual hallucinations and insomnia Doxepin 6 mg providing some relief Brain MRI scheduled for 5/30, I will call with results to discuss On wait list for geri clinic with Dr. McDiarmid Referred to neuropsych, although wait lists are long After MRI brain results back, consider referrals to neuro and/or psych  Patient interested in resuming weekly virtual check in appointments. These usually touch on active medical problems including diabetes, anxiety, and now her visual hallucinations+insomnia. Unfortunately I am not available until mid-to-late Rebecca Walter. She likes Wednesday and Thursday afternoon appointments. Patient booked next week with Dr. Madison Hickman.   [name], MD

## 2022-05-11 ENCOUNTER — Ambulatory Visit (INDEPENDENT_AMBULATORY_CARE_PROVIDER_SITE_OTHER): Payer: No Typology Code available for payment source | Admitting: Podiatry

## 2022-05-11 ENCOUNTER — Encounter: Payer: Self-pay | Admitting: Podiatry

## 2022-05-11 DIAGNOSIS — M79675 Pain in left toe(s): Secondary | ICD-10-CM | POA: Diagnosis not present

## 2022-05-11 DIAGNOSIS — M2041 Other hammer toe(s) (acquired), right foot: Secondary | ICD-10-CM

## 2022-05-11 DIAGNOSIS — M79674 Pain in right toe(s): Secondary | ICD-10-CM | POA: Diagnosis not present

## 2022-05-11 DIAGNOSIS — M4802 Spinal stenosis, cervical region: Secondary | ICD-10-CM

## 2022-05-11 DIAGNOSIS — B351 Tinea unguium: Secondary | ICD-10-CM

## 2022-05-11 DIAGNOSIS — Z794 Long term (current) use of insulin: Secondary | ICD-10-CM

## 2022-05-11 DIAGNOSIS — E1143 Type 2 diabetes mellitus with diabetic autonomic (poly)neuropathy: Secondary | ICD-10-CM

## 2022-05-11 DIAGNOSIS — R69 Illness, unspecified: Secondary | ICD-10-CM | POA: Diagnosis not present

## 2022-05-11 DIAGNOSIS — M2042 Other hammer toe(s) (acquired), left foot: Secondary | ICD-10-CM | POA: Diagnosis not present

## 2022-05-11 HISTORY — DX: Spinal stenosis, cervical region: M48.02

## 2022-05-12 ENCOUNTER — Ambulatory Visit (INDEPENDENT_AMBULATORY_CARE_PROVIDER_SITE_OTHER): Payer: No Typology Code available for payment source | Admitting: Student

## 2022-05-12 ENCOUNTER — Telehealth: Payer: Self-pay

## 2022-05-12 DIAGNOSIS — R441 Visual hallucinations: Secondary | ICD-10-CM | POA: Diagnosis not present

## 2022-05-12 DIAGNOSIS — I10 Essential (primary) hypertension: Secondary | ICD-10-CM

## 2022-05-12 DIAGNOSIS — R69 Illness, unspecified: Secondary | ICD-10-CM | POA: Diagnosis not present

## 2022-05-12 NOTE — Assessment & Plan Note (Signed)
Elevated 170/70 this morning per patient.  Taken at time of stress directly after waking up.  Advised rechecking later in the day after being relaxed for at least 15 minutes.

## 2022-05-12 NOTE — Assessment & Plan Note (Signed)
Nightly hallucinations continue but improved with mask use. MRI brain scheduled for 5/30. Does not sound like she has heard from neuropsychiatry yet. Follow up already scheduled for 6/26. Consider sooner depending on MRI results.

## 2022-05-12 NOTE — Telephone Encounter (Signed)
CMN Submitted

## 2022-05-12 NOTE — Progress Notes (Signed)
[facility] Visit  Patient consented to have virtual visit and was identified by name and date of birth. Method of visit: Telephone  Encounter participants: Patient: Rebecca Walter - located at home Provider: Wells Guiles - located at [facility]   Chief Complaint: Visual hallucinations  HPI:  Insomnia, visual hallucinations: Doxepin has helped some. They are strictly visual and continue to disturb her. She has been wearing a mask over her eyes which have helped her not see the hallucinations.  The hallucinations will point but will never speak and only happen during night time. She last saw a hallucination last week before she started wearing her mask. She is also having bad dreams about people from her past. They normally don't wake her from her sleep but if they do, she is able to fall asleep easily again.  States her BP at home was 170/70 this morning. She took this in the morning and states she may be stressed out because she's tired and restless.  ROS: per HPI  Pertinent PMHx: HFpEF 82 to 75%, HTN, OSA, T2DM, CKD 3, insomnia, restless leg syndrome  Exam:  No vitals able to be obtained for this visit.  Psych: normal mood, conversational, appropriate speech  Assessment/Plan:  Hypertension Elevated 170/70 this morning per patient.  Taken at time of stress directly after waking up.  Advised rechecking later in the day after being relaxed for at least 15 minutes.  Visual hallucination Nightly hallucinations continue but improved with mask use. MRI brain scheduled for 5/30. Does not sound like she has heard from neuropsychiatry yet. Follow up already scheduled for 6/26. Consider sooner depending on MRI results.    Time spent during visit with patient: 15 minutes

## 2022-05-17 NOTE — Progress Notes (Signed)
Subjective:  Patient ID: Rebecca Walter, female    DOB: 07-18-1941,  MRN: [id number]  Rebecca Walter presents to clinic today for at risk foot care with history of diabetic neuropathy and painful elongated mycotic toenails 1-5 bilaterally which are tender when wearing enclosed shoe gear. Pain is relieved with periodic professional debridement.  She is requesting diabetic shoes on today's visit.  New problem(s): None.   PCP is [name], MD , and last visit was May 04, 2022.  Allergies  Allergen Reactions   Acyclovir     Other reaction(s): diarrhea   Baclofen Other (See Comments)    Caused leg weakness   Hydrochlorothiazide Other (See Comments)    Uric acid elevation and gout.      Liraglutide Other (See Comments)    Tongue Glossitus   Losartan Potassium     Other reaction(s): diarrhea   Lotensin [Benazepril] Other (See Comments)    Dry cough    Benazepril Hcl     Other reaction(s): dry cough   Metoprolol Succinate [Metoprolol]     Other reaction(s): coughing   Beta Adrenergic Blockers Other (See Comments)    Occurred with metoprolol  coughing   Other Diarrhea and Other (See Comments)    Occurred with metoprolol  REACTION: dry coughing    Review of Systems: Negative except as noted in the HPI.  Objective: No changes noted in today's physical examination. General: Patient is a pleasant 81 y.o. Caucasian female in NAD. AAO x 3.   Neurovascular Examination: Capillary refill time to digits immediate b/l. Palpable pedal pulses b/l LE. Pedal hair sparse. No pain with calf compression b/l. Lower extremity skin temperature gradient within normal limits. No edema noted b/l LE. No cyanosis or clubbing noted b/l LE.  Protective sensation intact 5/5 intact bilaterally with 10g monofilament b/l. Vibratory sensation intact b/l.  Dermatological:  Pedal integument with normal turgor, texture and tone BLE. No open wounds b/l LE. No interdigital macerations noted b/l LE. Toenails  1-5 b/l elongated, discolored, dystrophic, thickened, crumbly with subungual debris and tenderness to dorsal palpation. Incurvated nailplate right great toe medial border(s) with tenderness to palpation. No erythema, no edema, no drainage noted.   Musculoskeletal:  Normal muscle strength 5/5 to all lower extremity muscle groups bilaterally. Hammertoe(s) noted to the bilateral 2nd toes.. No pain, crepitus or joint limitation noted with ROM b/l LE.  Patient ambulates independently without assistive aids.     Latest Ref Rng & Units 01/12/2022    1:30 PM 08/28/2021    3:05 PM 06/09/2021    3:28 PM  Hemoglobin A1C  Hemoglobin-A1c 0.0 - 7.0 % 7.2   7.7   7.3     Assessment/Plan: 1. Pain due to onychomycosis of toenails of both feet   2. Acquired hammertoes of both feet   3. Type 2 diabetes mellitus with diabetic autonomic neuropathy, with long-term current use of insulin (Rebecca Walter)     -Patient was evaluated and treated. All patient's and/or POA's questions/concerns answered on today's visit. -Continue foot and shoe inspections daily. Monitor blood glucose per PCP/Endocrinologist's recommendations. -Patient to continue soft, supportive shoe gear daily. Order entered for one pair extra depth shoes and 3 pair heat moldable insoles. Patient qualifies based on diagnoses. -Toenails 1-5 b/l were debrided in length and girth with sterile nail nippers and dremel without iatrogenic bleeding.  -Offending nail border debrided and curretaged R hallux utilizing sterile nail nipper and currette. Border cleansed with alcohol and triple antibiotic applied. No further treatment  required by patient/caregiver. Call office if there are any concerns. -Patient/POA to call should there be question/concern in the interim.   Return in about 3 months (around 08/11/2022).  Marzetta Board, DPM

## 2022-05-18 ENCOUNTER — Telehealth: Payer: Self-pay | Admitting: *Deleted

## 2022-05-18 ENCOUNTER — Ambulatory Visit
Admission: RE | Admit: 2022-05-18 | Discharge: 2022-05-18 | Disposition: A | Discharge status: Home / Self Care | Payer: No Typology Code available for payment source | Source: Ambulatory Visit | Attending: Family Medicine | Admitting: Family Medicine

## 2022-05-18 DIAGNOSIS — G319 Degenerative disease of nervous system, unspecified: Secondary | ICD-10-CM | POA: Diagnosis not present

## 2022-05-18 DIAGNOSIS — R441 Visual hallucinations: Secondary | ICD-10-CM

## 2022-05-18 DIAGNOSIS — J329 Chronic sinusitis, unspecified: Secondary | ICD-10-CM | POA: Diagnosis not present

## 2022-05-18 DIAGNOSIS — F29 Unspecified psychosis not due to a substance or known physiological condition: Secondary | ICD-10-CM | POA: Diagnosis not present

## 2022-05-18 DIAGNOSIS — I6782 Cerebral ischemia: Secondary | ICD-10-CM | POA: Diagnosis not present

## 2022-05-18 NOTE — Telephone Encounter (Signed)
LVM for pt to call office to see which pharmacy she would like her refill to be sent to .   Received fax notification from [facility] that she would need a refill on her valsartan on 05/26/2022. We have [facility] and [facility] on her list of preferred pharmacies.  Which one would she like [place] to send it to.   Danny Zimny Zimmerman Rumple, CMA

## 2022-05-20 NOTE — Telephone Encounter (Signed)
Pt returned call and we decided that we would wait until she request the refill from her pharmacy and then we will send in the refills.Rebecca Walter, CMA

## 2022-05-21 ENCOUNTER — Telehealth: Payer: Self-pay | Admitting: *Deleted

## 2022-05-21 NOTE — Telephone Encounter (Signed)
Left message for patient with Geri appt information.  06-24-22 at 1:30pm.  She will need to pick up the geri paperwork when she is here with Dr. Jeani Hawking in a few weeks.  Mychael Soots,CMA

## 2022-05-21 NOTE — Telephone Encounter (Signed)
-----   Message from Valerie Roys, [place] sent at 04/16/2022  7:55 AM EDT ----- Regarding: FW: Geri clinic  ----- Message ----- From: [name], MD Sent: 04/15/2022   2:56 PM EDT To: [place] Admin Subject: [place] clinic                                    This patient needs scheduling with [facility] as soon as possible. New onset visual hallucinations. Possible grief reaction, however working up for brain etiology.   [employer]

## 2022-05-21 NOTE — Telephone Encounter (Signed)
Patient called back stating she is unsure if she wants an appointment with geri clinic.  She would like to talk with you about it first before scheduling.  I will go ahead and keep her on to allow for you to reach her about this.  Thanks [employer]

## 2022-05-25 ENCOUNTER — Other Ambulatory Visit: Payer: Self-pay | Admitting: Family Medicine

## 2022-05-25 ENCOUNTER — Encounter: Payer: Self-pay | Admitting: *Deleted

## 2022-05-25 ENCOUNTER — Telehealth: Payer: Self-pay | Admitting: Family Medicine

## 2022-05-25 NOTE — Telephone Encounter (Signed)
Attempted to call patient regarding normal head MRI and upcoming geri clinic. No answer, left HIPPA safe VM relaying normal/negative brain MRI results and also encouraging her to keep the geri clinic appointment so we can do a full med rec and multi-disciplinary team evaluation of her visual hallucinations.   [name], MD

## 2022-05-25 NOTE — Telephone Encounter (Signed)
Called patient. See related phone note already started regarding geri clinic. No answer, left VM with normal/negative results.   [name], MD

## 2022-05-26 DIAGNOSIS — R69 Illness, unspecified: Secondary | ICD-10-CM | POA: Diagnosis not present

## 2022-06-02 ENCOUNTER — Other Ambulatory Visit: Payer: No Typology Code available for payment source

## 2022-06-06 ENCOUNTER — Telehealth: Payer: Self-pay | Admitting: Family Medicine

## 2022-06-06 NOTE — Telephone Encounter (Signed)
**  After Hours/ Emergency Line Call**  Received after-hours emergency line call from Haasini C Sprankle. Patient has had loose stool for the past 1 week. Non-bloody. No nausea, vomiting, abdominal pain, or fever.  She thinks it's related to the increased dose of Synthroid because she read diarrhea can be a side effect. She was increased from 2mg to 537m approximately 1 month ago due to TSH of 26.100.  Reassured patient that it's unlikely her diarrhea is related to the synthroid given that she increased the dose several weeks prior to developing diarrhea. More likely a viral gastroenteritis. Reassuringly her symptoms are improving today.  Patient has appointment with PCP, Dr LyJeani Hawkingin 1 week. Advised patient to continue Synthroid 5016mfor now and can discuss further at upcoming appointment. Will forward to PCP as FYI.  AshAlcus DadD PGY-2, [facility] 06/06/2022 5:48 PM

## 2022-06-07 NOTE — Telephone Encounter (Signed)
Patient returns call to nurse line regarding previous message. Patient states that she has not taken synthroid this morning as she is afraid that this contributed to her having loose stools.   Patient reports having 1 BM in the last 24 hours that was loose but formed. Patient is eating and drinking normally. Patient also reports having a low grade fever last week that she treated with tylenol.   Advised patient that per note from Dr. Rock Nephew that she was to continue current synthroid dosage as this was likely related to viral GI infection. Patient would like to speak with Dr. Jeani Hawking before resuming thyroid medication.   Patient also reports elevated BP this morning at 177/80. Patient is asymptomatic.   Provided with return precautions.   Please return call to patient at [phone].  Talbot Grumbling, RN

## 2022-06-07 NOTE — Progress Notes (Unsigned)
Cardiology Office Note   Date:  06/09/2022   ID:  Rebecca Walter, DOB 06/10/1941, MRN [id number]  PCP:  [name], MD  Cardiologist:   Dorris Carnes, MD   F/U of HTN     History of Present Illness: Rebecca Walter is a 81 y.o. female with a history of HTN, HL, DM, GERD, diastolic CHF  Pt had stress test remotely   Hx of hypertensive emergency in past.    Last echo  2018 LVEF 60 to 85%  Mild diastolic dysfunction.   Myovue low risk in 2018 No significant ischemia  Pt has atherosclerosis of aort by CT scan in 2021  I last saw the pt in [place] in Nov 2021    Breathing has not been good   Gets SOB with activity   Still with swallowing problems.  Pt denies CP    BP has been running high at times   170 this am    Feels bloated if drinks water      Current Meds  Medication Sig   acetaminophen (TYLENOL) 325 MG tablet Take 325 mg by mouth every 8 (eight) hours as needed (pain).    allopurinol (ZYLOPRIM) 100 MG tablet Take 2 tablets (200 mg total) by mouth daily.   amLODipine (NORVASC) 10 MG tablet Take 1 tablet (10 mg total) by mouth daily.   amLODipine (NORVASC) 2.5 MG tablet TAKE 1 TABLET 2 (TWO) TIMES DAILY AS NEEDED (FOR ELEVATED BLOOD PRESSURE SYSTOLIC >885 ONLY).   aspirin 81 MG chewable tablet Chew 1 tablet (81 mg total) by mouth daily.   B-D ULTRAFINE III SHORT PEN 31G X 8 MM MISC USE TO INJECT INSULIN AS PRESCRIBED.   baclofen (LIORESAL) 10 MG tablet TAKE 1 TABLET BY MOUTH TWICE A DAY   Blood Glucose Monitoring Suppl (ACCU-CHEK AVIVA PLUS) w/Device KIT 1 Device by Does not apply route daily.   busPIRone (BUSPAR) 15 MG tablet Take 1 tablet (15 mg total) by mouth 2 (two) times daily.   Camphor-Eucalyptus-Menthol (VICKS VAPORUB) 4.73-1.2-2.6 % OINT See admin instructions.   diclofenac Sodium (VOLTAREN) 1 % GEL Apply 2 g topically 3 (three) times daily. To help with knee pain.   Doxepin HCl 6 MG TABS Take 1 tablet (6 mg total) by mouth at bedtime as needed.   empagliflozin  (JARDIANCE) 10 MG TABS tablet Take 1 tablet (10 mg total) by mouth daily. Take EITHER this OR [place]. DO NOT take both.   famotidine (PEPCID) 20 MG tablet TAKE 1 TABLET BY MOUTH TWICE A DAY   glucose blood test strip Use as instructed   ibuprofen (ADVIL) 200 MG tablet Take 200 mg by mouth daily as needed for mild pain or moderate pain.   insulin glargine (LANTUS SOLOSTAR) 100 UNIT/ML Solostar Pen Inject 64 Units into the skin daily.   levothyroxine (SYNTHROID) 50 MCG tablet Take 1 tablet (50 mcg total) by mouth every morning. 30 minutes before food   lidocaine (XYLOCAINE) 2 % solution TAKE 15 MLS IN THE THROAT TWICE A DAY AS NEEDED ,USE WITH MAALOX   loperamide (IMODIUM A-D) 2 MG tablet 1 tablet as needed   loratadine (CLARITIN) 10 MG tablet Take 10 mg by mouth daily.   lovastatin (MEVACOR) 40 MG tablet TAKE 1 TABLET BY MOUTH EVERYDAY AT BEDTIME   metoCLOPramide (REGLAN) 5 MG tablet 1 tablet before meals   Multiple Vitamin (MULTI VITAMIN) TABS 1 tablet   NOVOLOG FLEXPEN 100 UNIT/ML FlexPen INJECT 9-18 UNITS INTO THE SKIN SEE  ADMIN INSTRUCTIONS. INJECT 15 UNITS SUBCUTANEOUSLY DAILY WITH BREAKFAST, INJECT 9 UNITS WITH LUNCH AS NEEDED FOR CBG 180 OR MORE, AND INJECT 9 UNITS WITH SUPPER   pantoprazole (PROTONIX) 40 MG tablet Take 1 tablet by mouth 2 (two) times daily.   Polyethyl Glycol-Propyl Glycol (SYSTANE) 0.4-0.3 % GEL ophthalmic gel Place 1 application  into both eyes 3 (three) times daily.   Semaglutide,0.25 or 0.5MG/DOS, (OZEMPIC, 0.25 OR 0.5 MG/DOSE,) 2 MG/1.5ML SOPN Inject 0.25 mg into the skin once a week. If tolerating, will increase to 0.5 mg weekly after 4 weeks.   sertraline (ZOLOFT) 100 MG tablet Take 1 tablet (100 mg total) by mouth daily.   sertraline (ZOLOFT) 50 MG tablet TAKE 1 TABLET IN THE AFTERNOON, ONE DOSE AT BEDTIME   spironolactone (ALDACTONE) 25 MG tablet TAKE 1 TABLET BY MOUTH EVERY DAY   torsemide (DEMADEX) 10 MG tablet TAKE 2 TABLETS (20 MG TOTAL) BY MOUTH DAILY AS  NEEDED (EDEMA, >3LB WEIGHT GAIN).   valsartan (DIOVAN) 320 MG tablet TAKE 1 TABLET BY MOUTH EVERY DAY   Vitamin E 400 units TABS 1 tablet     Allergies:   Acyclovir, Baclofen, Hydrochlorothiazide, Liraglutide, Losartan potassium, Lotensin [benazepril], Benazepril hcl, Metoprolol succinate [metoprolol], Beta adrenergic blockers, and Other   Past Medical History:  Diagnosis Date   Anxiety    Arthritis    Basal cell carcinoma 03/02/2021   Candidiasis of skin 02/14/2020   CARCINOMA, BASAL CELL 05/26/2007   Qualifier: History of  By: Barbra Sarks MD, Apolonio Schneiders     Chronic diastolic CHF (congestive heart failure) ([place])    Echocardiogram 01/2020: EF 70-75, no RWMA, Gr 1 DD, normal RVSF, RVSP 45.8, mild to mod LAE, trivial MR, mild PI   Diabetes mellitus    Diarrhea of presumed infectious origin 01/16/2021   DYSKINESIA, ESOPHAGUS 09/13/2007   Qualifier: Diagnosis of  By: Brigitte Pulse MD, Kimberlee     Early satiety 09/04/2020   Elevated TSH 11/21/2017   Epistaxis 02/04/2020   Generalized weakness 12/15/2018   GERD (gastroesophageal reflux disease)    Gout    Healthcare maintenance 10/11/2020   History of nuclear stress test    Myoview 06/2020: EF 75, no ischemia or infarction, low risk   Hoarseness 11/27/2019   Hyperlipidemia    Hypertension    Hypertensive heart disease with CHF (congestive heart failure) ([place])    Hyponatremia    Leg fatigue 01/03/2021   Mild aortic stenosis 06/2017   Mild mitral regurgitation 06/2017   Moderate pulmonary valve insufficiency 06/2017   OBESITY, NOS 02/16/2007   Qualifier: Diagnosis of  By: Drucie Ip     Osteopenia    Vertigo 08/29/2013    Past Surgical History:  Procedure Laterality Date   KIDNEY SURGERY     KNEE SURGERY     MENISECTOMY     WRIST SURGERY       Social History:  The patient  reports that she has never smoked. She has never used smokeless tobacco. She reports that she does not drink alcohol and does not use drugs.   Family History:  The  patient's family history includes Cancer in her brother and sister; Diabetes in her maternal uncle; Heart disease in her brother and father; Hypertension in her father; Stroke in her father and mother.    ROS:  Please see the history of present illness. All other systems are reviewed and  Negative to the above problem except as noted.    PHYSICAL EXAM: VS:  BP (!) 144/58  Pulse 76   Ht 5' (1.524 m)   Wt 207 lb (93.9 kg)   LMP  (LMP Unknown)   SpO2 93%   BMI 40.43 kg/m    GEN: Morbidly obese 81 yo , in no acute distress  HEENT: normal  Neck: JVP normal  Cardiac: RRR; no murmurs, Respiratory:  clear to auscultation bilaterally  Some decreased airflow    GI: soft, MS:  Moving all extremities   Skin: warm  Neuro:  CN II to XII grossly intact   No gross abnormalties    Psych: euthymic mood, full affect   EKG:  EKG is ordered today.  NSR    76 bpm   First degree AV block   PR 206 msec  LAD    Studies  LT Cardiac Monitor 12/2019 Sinus rhythm 28 to 160 bpm  Average 59 bpm    Occasional PVCs, PAC  Rare burst NSVT   Longest 8 beats    Carotid US 12/12/2019 Bilat ICA 1-39; L vertebral artery stenosis, R subclavian stenosis   Myoview 09/06/17 EF 67, small area of apicoseptal ischemia; low risk   Echocardiogram 07/16/17 Mild LVH, EF 60-65, Gr 1 DD, mild AI, mild MR, mild LAE, mod PI, PASP 35 Trivial eff Lipid Panel    Component Value Date/Time   CHOL 188 03/31/2022 1512   TRIG 233 (H) 03/31/2022 1512   HDL 47 03/31/2022 1512   CHOLHDL 4.0 03/31/2022 1512   CHOLHDL 4.2 07/16/2017 0402   VLDL 50 (H) 07/16/2017 0402   LDLCALC 101 (H) 03/31/2022 1512   LDLDIRECT 87 09/10/2021 1059   LDLDIRECT 103 (H) 03/11/2010 2127      Wt Readings from Last 3 Encounters:  06/09/22 207 lb (93.9 kg)  05/04/22 205 lb (93 kg)  04/15/22 202 lb 12.8 oz (92 kg)      ASSESSMENT AND PLAN:  1  HTN  Keep on current meds .  Will look into sleep eval     2  HL  Last LDL 101   Keep on  lovastatin Add Repatha    Check lipids and Liver panel in 8 wks    3  Diastolic CHF  Volume status is OK  Keep on same meds   4  PAD   Work to control risk factors        Current medicines are reviewed at length with the patient today.  The patient does not have concerns regarding medicines.  Signed, Dorris Carnes, MD  06/09/2022 1:50 PM    [facility] [address], [place], [place]  [address] Phone: [phone]; Fax: [phone]

## 2022-06-09 ENCOUNTER — Other Ambulatory Visit: Payer: Self-pay | Admitting: Internal Medicine

## 2022-06-09 ENCOUNTER — Ambulatory Visit (INDEPENDENT_AMBULATORY_CARE_PROVIDER_SITE_OTHER): Payer: No Typology Code available for payment source | Admitting: Internal Medicine

## 2022-06-09 ENCOUNTER — Encounter: Payer: Self-pay | Admitting: Internal Medicine

## 2022-06-09 VITALS — BP 144/58 | HR 76 | Ht 60.0 in | Wt 207.0 lb

## 2022-06-09 DIAGNOSIS — E782 Mixed hyperlipidemia: Secondary | ICD-10-CM | POA: Diagnosis not present

## 2022-06-09 DIAGNOSIS — R0600 Dyspnea, unspecified: Secondary | ICD-10-CM | POA: Diagnosis not present

## 2022-06-09 DIAGNOSIS — I5032 Chronic diastolic (congestive) heart failure: Secondary | ICD-10-CM | POA: Diagnosis not present

## 2022-06-09 MED ORDER — REPATHA SURECLICK 140 MG/ML ~~LOC~~ SOAJ: 140.0000 mL | SUBCUTANEOUS | 11 refills | Status: DC

## 2022-06-09 NOTE — Patient Instructions (Signed)
Medication Instructions:   START TAKING:  REPATHA INJECTION 140 ML ONCE EVERY TWO WEEKS    *If you need a refill on your cardiac medications before your next appointment, please call your pharmacy*   Lab Work: RETURN IN 8 WEEKS FASTING LIPID AND LFT   If you have labs (blood work) drawn today and your tests are completely normal, you will receive your results only by: [place] (if you have MyChart) OR A paper copy in the mail If you have any lab test that is abnormal or we need to change your treatment, we will call you to review the results.   Testing/Procedures: NONE ORDERED  TODAY    Follow-Up: At [facility], you and your health needs are our priority.  As part of our continuing mission to provide you with exceptional heart care, we have created designated Provider Care Teams.  These Care Teams include your primary Cardiologist (physician) and Advanced Practice Providers (APPs -  Physician Assistants and Nurse Practitioners) who all work together to provide you with the care you need, when you need it.  We recommend signing up for the patient portal called "MyChart".  Sign up information is provided on this After Visit Summary.  MyChart is used to connect with patients for Virtual Visits (Telemedicine).  Patients are able to view lab/test results, encounter notes, upcoming appointments, etc.  Non-urgent messages can be sent to your provider as well.   To learn more about what you can do with MyChart, go to [email or web address].    Your next appointment:   9 month(s)  The format for your next appointment:   In Person  Provider:   Dorris Carnes, MD     Other Instructions   Important Information About Sugar

## 2022-06-11 NOTE — Telephone Encounter (Signed)
Called patient to discuss. She reports that she had not been taking her Synthroid regularly. This may have been the reason TSH was elevated. She has since been taking one pill (instead of the two that she was told to increase to). She has no longer had loose stools. Recommended that she continue taking one pill each morning without missed doses and she can have repeat TSH in 6 weeks to see if she still needs adjustment. She has an appt with PCP on 6/26.

## 2022-06-14 ENCOUNTER — Ambulatory Visit: Payer: No Typology Code available for payment source | Admitting: Family Medicine

## 2022-06-15 ENCOUNTER — Telehealth: Payer: Self-pay | Admitting: Internal Medicine

## 2022-06-15 NOTE — Telephone Encounter (Signed)
PA for Praluent submitted.  Key: [id number]

## 2022-06-15 NOTE — Telephone Encounter (Signed)
I spoke with patient who reports pharmacy did not have first medication that was ordered by Dr Tenny Craw.  She was then changed to another medication.   Patient reports this needs insurance approval. Patient reports she gives herself insulin and feels comfortable giving herself injectable medication.  Does not feel she needs an appointment for teaching. I spoke with [facility].  They have prescription for Praluent but it needs prior authorization

## 2022-06-16 NOTE — Telephone Encounter (Signed)
PA for Praluent approved through 06/15/23

## 2022-06-21 ENCOUNTER — Other Ambulatory Visit: Payer: Self-pay | Admitting: *Deleted

## 2022-06-21 DIAGNOSIS — I340 Nonrheumatic mitral (valve) insufficiency: Secondary | ICD-10-CM

## 2022-06-23 ENCOUNTER — Telehealth: Payer: Self-pay | Admitting: Internal Medicine

## 2022-06-23 MED ORDER — TORSEMIDE 10 MG PO TABS: ORAL_TABLET | ORAL | 1 refills | Status: DC

## 2022-06-23 NOTE — Telephone Encounter (Signed)
Called patient and completed patient assistance application over the phone with her

## 2022-06-23 NOTE — Telephone Encounter (Signed)
Pt c/o medication issue:  1. Name of Medication:   Alirocumab ([place]) 75 MG/ML SOAJ    2. How are you currently taking this medication (dosage and times per day)?  Inject 1 mL into the skin every 14 (fourteen) days. 3. Are you having a reaction (difficulty breathing--STAT)? \No  4. What is your medication issue? Pt would like to know if there is any type of assistance that she is able to get to help with the cost of medication. Please advise

## 2022-06-24 ENCOUNTER — Ambulatory Visit: Payer: No Typology Code available for payment source

## 2022-06-24 DIAGNOSIS — G319 Degenerative disease of nervous system, unspecified: Secondary | ICD-10-CM | POA: Insufficient documentation

## 2022-06-25 ENCOUNTER — Telehealth: Payer: Self-pay | Admitting: Internal Medicine

## 2022-06-25 NOTE — Telephone Encounter (Signed)
Spoke with Praluent patient assistance. They request a Rx be sent to [phone] including diagnosis codes.  Patient ID is [id number]

## 2022-06-25 NOTE — Telephone Encounter (Signed)
New Message:    Rebecca Walter called from [employer]  Program. She said she need the section 2 of the application or prescription with diagnosis. The prescription or section 2 will have to be hand signed and dated. The patient will also provide the proof of spend down. This means patient need to show proof that they spend $500 out of pocket for medicine, which includes the entire household for 2023. You can fax this to [phone].

## 2022-06-30 ENCOUNTER — Other Ambulatory Visit: Payer: No Typology Code available for payment source

## 2022-06-30 NOTE — Telephone Encounter (Signed)
   Harold with pt assistance calling again about the section 2 part of the application. He said they did not receive the the information sent on 7/07. He said, he faxed the form again today, he said, that can be filed out or send the primary diagnosis code. He gave health care provider portal if we want to send it electronically [email or web address].

## 2022-07-01 ENCOUNTER — Other Ambulatory Visit: Payer: No Typology Code available for payment source

## 2022-07-05 NOTE — Telephone Encounter (Signed)
Caller is following-up on the patient's application to get Praluent.  Caller stated section 2 of the application they faxed needs to be completed or a prescription faxed to them at the below number.  Ref#  [id number] Fax#  [phone] Health care portal: [email or web address] Ph#  [phone]

## 2022-07-06 ENCOUNTER — Encounter: Payer: Self-pay | Admitting: Family Medicine

## 2022-07-06 ENCOUNTER — Ambulatory Visit (INDEPENDENT_AMBULATORY_CARE_PROVIDER_SITE_OTHER): Payer: No Typology Code available for payment source | Admitting: Family Medicine

## 2022-07-06 VITALS — BP 137/59 | HR 70 | Ht 60.0 in | Wt 200.5 lb

## 2022-07-06 DIAGNOSIS — E785 Hyperlipidemia, unspecified: Secondary | ICD-10-CM

## 2022-07-06 DIAGNOSIS — R441 Visual hallucinations: Secondary | ICD-10-CM | POA: Diagnosis not present

## 2022-07-06 DIAGNOSIS — E1143 Type 2 diabetes mellitus with diabetic autonomic (poly)neuropathy: Secondary | ICD-10-CM | POA: Diagnosis not present

## 2022-07-06 DIAGNOSIS — Z794 Long term (current) use of insulin: Secondary | ICD-10-CM

## 2022-07-06 DIAGNOSIS — R69 Illness, unspecified: Secondary | ICD-10-CM | POA: Diagnosis not present

## 2022-07-06 LAB — POCT GLYCOSYLATED HEMOGLOBIN (HGB A1C): HbA1c, POC (controlled diabetic range): 7.9 % — AB (ref 0.0–7.0)

## 2022-07-06 NOTE — Patient Instructions (Signed)
It was wonderful to see you today. Thank you for allowing me to be a part of your care. Below is a short summary of what we discussed at your visit today:  Diabetes Your hemoglobin A1c today was 7.9.  Yes this is higher than last time, but you are still under our goal of 8.  For the [place] prescription (2 drugs in the same class), try looking on the "good Rx" website or phone app.  Sometimes they have coupons that work with your pharmacy that makes the drug cheaper with a coupon than it would be with insurance.  I have also given you a pharmacy savings card that we have here at the clinic that works very similarly.  Blood pressure Your blood pressure today in clinic was acceptable.  It was 137/59.  Because of the lower bottom number, I would not want to drive your blood pressure any lower.  This can make you dizzy and pass out.  Geriatric clinic I have sent a message to [facility] and our front office.  She will call you to schedule the appointment with the geriatric clinic.  This is with Dr. McDiarmid and Dr. Valentina Lucks.   Please bring all of your medications to every appointment!  If you have any questions or concerns, please do not hesitate to contact us via phone or MyChart message.   [name], MD

## 2022-07-07 ENCOUNTER — Other Ambulatory Visit: Payer: Self-pay | Admitting: Family Medicine

## 2022-07-08 NOTE — Assessment & Plan Note (Signed)
Improved with eye hydration and using eye mask at nighttime.  Discussed risks of using Chapstick to lubricate eye, recommended lubricating eyedrops instead.  MRI negative for mass effect, negative for stigmata of dementia etiology.  She has ophthalmology appointment in October.  Discussed importance of geriatric clinic with Dr. McDiarmid, patient amenable to rescheduling.

## 2022-07-08 NOTE — Assessment & Plan Note (Signed)
A1c today still at goal under 8, but slightly higher than last measurement.  Patient reports adherence to Lantus 64 units daily and NovoLog 14 units with sliding scale.  However, Rebecca Walter has not been taking the [place] because of cost.  Previously we tried both [place] and [place] through insurance, Rebecca Walter could afford neither of the co-pays.  We attempted to connect her with a patient assistance program, but this appears to have been unsuccessful.  I discussed with her trying good Rx or a similar program.  Patient is to reach back out to [place] if Rebecca Walter is unable to obtain [place] or [place].  I am okay with either as long as Rebecca Walter is able to afford it.

## 2022-07-08 NOTE — Assessment & Plan Note (Signed)
Patient reports her cardiologist is trying to get her on a new type of lipid medication called Praluent.  The cardiology office is currently working on the patient assistance program application for her.

## 2022-07-08 NOTE — Progress Notes (Signed)
    SUBJECTIVE:   CHIEF COMPLAINT / HPI:   Visual hallucinations follow-up Patient reports decreased visual hallucinations, now only once in a while She prevents seeing these nighttime hallucinations by covering her eyes with an eye mask at night She did have an appointment with her regular eye doctor, however could not see him because her insurance no longer considered an in network.  She made appointment with a new eye doctor, but does not have an appointment until October. She reports improvement in both eye comfort and decrease in nighttime hallucinations with lubricating eyedrops Also reported improvement with putting a little bit of thick "old school" Chapstick near her lash line and rubbing a bit into her eyes Patient now believes this is all due to her vision changing Previous brain MRI unremarkable, did not show any mass effects or stigmata of dementia etiology  T2DM Reports a.m. sugars between 170 and 210 Tolerating medications quite well Had previously been prescribed Jardiance but was unable to afford it.  We attempted [place] as well, but she was unable to afford the co-pay.  We attempted to get her connected with a patient assistance program, but this appears to have not come to fruition.  I am not quite sure where we are in the process.  Lab Results  Component Value Date   HGBA1C 7.9 (A) 07/06/2022   HGBA1C 7.2 (A) 01/12/2022   HGBA1C 7.7 (A) 08/28/2021   Lab Results  Component Value Date   LDLCALC 101 (H) 03/31/2022   CREATININE 1.24 (H) 05/04/2022    PERTINENT  PMH / PSH: Visual hallucinations, insomnia, mild cerebral atrophy, T2DM, diabetic autonomic neuropathy, hypothyroidism, osteopenia, osteoarthritis, CKD 3, HLD, frequent falls, HFpEF, moderate PV insufficiency, HTN  OBJECTIVE:   BP (!) 137/59   Pulse 70   Ht 5' (1.524 m)   Wt 200 lb 8 oz (90.9 kg)   LMP  (LMP Unknown)   SpO2 95%   BMI 39.16 kg/m   General: Awake, alert, oriented, no acute  distress Cardiac: Regular rate and rhythm, no murmur Respiratory: CTAB  ASSESSMENT/PLAN:   Visual hallucination Improved with eye hydration and using eye mask at nighttime.  Discussed risks of using Chapstick to lubricate eye, recommended lubricating eyedrops instead.  MRI negative for mass effect, negative for stigmata of dementia etiology.  She has ophthalmology appointment in October.  Discussed importance of geriatric clinic with Dr. McDiarmid, patient amenable to rescheduling.  Diabetes mellitus, type II ([place]) A1c today still at goal under 8, but slightly higher than last measurement.  Patient reports adherence to Lantus 64 units daily and NovoLog 14 units with sliding scale.  However, she has not been taking the [place] because of cost.  Previously we tried both [place] and [place] through insurance, she could afford neither of the co-pays.  We attempted to connect her with a patient assistance program, but this appears to have been unsuccessful.  I discussed with her trying good Rx or a similar program.  Patient is to reach back out to [place] if she is unable to obtain [place] or [place].  I am okay with either as long as she is able to afford it.  HLD (hyperlipidemia), primary prevention, goal LDL <100 Patient reports her cardiologist is trying to get her on a new type of lipid medication called Praluent.  The cardiology office is currently working on the patient assistance program application for her.     [name], MD [place]

## 2022-07-09 NOTE — Telephone Encounter (Signed)
Application currently waiting on signature from Dr Harrington Challenger

## 2022-07-09 NOTE — Telephone Encounter (Signed)
Patient is following up. She states [employer] contacted her stating they are still waiting for forms to be completed. I informed the patient, per previous note, application is currently awaiting signature from Dr. Harrington Challenger. I informed her  Dr. Harrington Challenger is not in today, but she is scheduled to return to the office on Monday, 7/24. Patient understands. No further questions at this time.

## 2022-07-16 ENCOUNTER — Other Ambulatory Visit: Payer: Self-pay

## 2022-07-16 MED ORDER — ALLOPURINOL 100 MG PO TABS: 200.0000 mg | ORAL_TABLET | Freq: Every day | ORAL | 2 refills | Status: DC

## 2022-07-19 ENCOUNTER — Other Ambulatory Visit: Payer: Self-pay | Admitting: Family Medicine

## 2022-07-20 NOTE — Progress Notes (Deleted)
I have scheduled patient for 09-02-22 at 2:30pm.  I am mailing the appointment card, if you can remind patient at your upcoming appt.  Thanks [employer]

## 2022-07-21 NOTE — Telephone Encounter (Signed)
Form is signed and faxed to Praluent patient assistance

## 2022-07-21 NOTE — Telephone Encounter (Signed)
Form signed and sent back to [facility]

## 2022-07-22 ENCOUNTER — Telehealth: Payer: Self-pay

## 2022-07-22 DIAGNOSIS — E1143 Type 2 diabetes mellitus with diabetic autonomic (poly)neuropathy: Secondary | ICD-10-CM

## 2022-07-22 MED ORDER — BD PEN NEEDLE SHORT U/F 31G X 8 MM MISC: 11 refills | Status: DC

## 2022-07-22 NOTE — Telephone Encounter (Signed)
Patient calls nurse line in regards to pen needles.   Per pharmacy we need to update prescription to reflect TID use.   Will forward to PCP.

## 2022-07-22 NOTE — Telephone Encounter (Signed)
Thank you for the pended order. I have confirmed this is correct and signed.  [name], MD

## 2022-07-23 ENCOUNTER — Other Ambulatory Visit: Payer: Self-pay | Admitting: Family Medicine

## 2022-07-23 DIAGNOSIS — Z794 Long term (current) use of insulin: Secondary | ICD-10-CM

## 2022-07-23 DIAGNOSIS — E118 Type 2 diabetes mellitus with unspecified complications: Secondary | ICD-10-CM

## 2022-07-29 ENCOUNTER — Ambulatory Visit: Payer: No Typology Code available for payment source | Admitting: Family Medicine

## 2022-07-29 DIAGNOSIS — R69 Illness, unspecified: Secondary | ICD-10-CM | POA: Diagnosis not present

## 2022-08-04 ENCOUNTER — Other Ambulatory Visit: Payer: No Typology Code available for payment source

## 2022-08-04 DIAGNOSIS — E782 Mixed hyperlipidemia: Secondary | ICD-10-CM | POA: Diagnosis not present

## 2022-08-05 LAB — LIPID PANEL
Chol/HDL Ratio: 3.8 ratio (ref 0.0–4.4)
Cholesterol, Total: 157 mg/dL (ref 100–199)
HDL: 41 mg/dL (ref >39)
LDL Chol Calc (NIH): 78 mg/dL (ref 0–99)
Triglycerides: 230 mg/dL — ABNORMAL HIGH (ref 0–149)
VLDL Cholesterol Cal: 38 mg/dL (ref 5–40)

## 2022-08-05 LAB — HEPATIC FUNCTION PANEL
ALT: 30 IU/L (ref 0–32)
AST: 25 IU/L (ref 0–40)
Albumin: 4.4 g/dL (ref 3.7–4.7)
Alkaline Phosphatase: 51 IU/L (ref 44–121)
Bilirubin Total: 0.2 mg/dL (ref 0.0–1.2)
Bilirubin, Direct: <0.1 mg/dL (ref 0.00–0.40)
Total Protein: 7.2 g/dL (ref 6.0–8.5)

## 2022-08-11 ENCOUNTER — Telehealth: Payer: Self-pay

## 2022-08-11 NOTE — Telephone Encounter (Signed)
Pt advised her lab results and reports that she never started the repatha due to financial restraints... she says she has not heard back re: the pt assistance.

## 2022-08-11 NOTE — Telephone Encounter (Signed)
Completed patient assistance forms for Praluent with signatures were faxed on 07/21/22.

## 2022-08-11 NOTE — Telephone Encounter (Signed)
-----   Message from Dorris Carnes V, MD sent at 08/05/2022 10:31 PM EDT ----- Liver function tests ae normal LDL is better   78   Is she taking repatha as directed      Triglycerides are still elevated    Watch carbs, limit sugar

## 2022-08-12 ENCOUNTER — Ambulatory Visit (INDEPENDENT_AMBULATORY_CARE_PROVIDER_SITE_OTHER): Payer: No Typology Code available for payment source | Admitting: Family Medicine

## 2022-08-12 ENCOUNTER — Encounter: Payer: Self-pay | Admitting: Family Medicine

## 2022-08-12 DIAGNOSIS — R69 Illness, unspecified: Secondary | ICD-10-CM | POA: Diagnosis not present

## 2022-08-12 DIAGNOSIS — I10 Essential (primary) hypertension: Secondary | ICD-10-CM

## 2022-08-12 DIAGNOSIS — M170 Bilateral primary osteoarthritis of knee: Secondary | ICD-10-CM

## 2022-08-12 NOTE — Progress Notes (Signed)
    SUBJECTIVE:   CHIEF COMPLAINT / HPI:   HTN At home this morning, 180s/70s, 161/67 Home meds: amlodipine 10 daily +2.5 mg PRN, valsartan 320, torsemide 10, spironolactone 25 First thing in morning, feels unsteady but then okay rest of day No orthostatic hypotension, dizziness, presyncope Patient is concerned about her blood pressure numbers being too high and asks if we should adjust medications  BP Readings from Last 3 Encounters:  08/12/22 (!) 156/49  07/06/22 (!) 137/59  06/09/22 (!) 144/58    Bilateral knee arthritis Chronic condition, has been worsening.  Noted in chart since 2020.  Patient reports gradual worsening with chronic aching.  She is interested in escalating care.  She reports pain worst in medial compartments. No signs of infection, denies red hot swollen knees.  No history of gout. Has not previously prescribed Voltaren gel, topical lidocaine, and recommended ice, heat, and water aerobics.  No history of traumas to the knee, no surgeries.  We do not have any knee x-rays on file.  Diabetes is currently at goal, A1c last 7.9.   PERTINENT  PMH / PSH: HTN, HFpEF 70-75%, moderate pulmonary valve insufficiency, mild mitral regurg, mild aortic stenosis, OSA, dysphagia, hypothyroidism, T2DM, osteopenia, bilateral knee OA, CKD 3, HLD  OBJECTIVE:   BP (!) 156/49   Pulse 75   Ht 5' (1.524 m)   Wt 206 lb 12.8 oz (93.8 kg)   LMP  (LMP Unknown)   SpO2 95%   BMI 40.39 kg/m    General: Awake, alert, oriented, no acute distress Cardiac: Regular rate and rhythm Respiratory: CTA B Knees: Mild TTP along joint lines, no TTP along patellar or quad tendons.  No gross deformity or palpable swelling.  Full ROM bilaterally.  ASSESSMENT/PLAN:   Hypertension Stable.  Does have systolic hypertension with low normal diastolics.  I inherited her on a regimen of valsartan 320 mg, torsemide 10 mg, spironolactone 25 mg, and amlodipine 10 mg daily +2.5 mg as needed (for days when  systolics >431).  We have discussed simplification in the past, but patient politely declined each time given that she is quite comfortable with her current regimens.  Of note, this also applies to her diabetic regimen.  Given her low normal diastolics, age, and comorbidities, I would not increase any medications.  In her, I believe the best way to measure and titrate her blood pressure is in the standing position so as to avoid orthostatic hypotension and syncope.  No changes to regimen at this time.  Osteoarthritis of both knees Gradual worsening despite conservative therapies.  We are limited in medication, cannot use NSAIDs due to CKD 3.  Patient is interested in steroid and lidocaine injections of her knees.  Diabetes is currently at goal.  Given no signs of infection or gout, believe this to be reasonable.  Patient has scheduled 2 appointments with myself in the future, 1 for each knee.  Discussed normal need for bilateral knee x-rays prior to injection, however patient completely declines.  Discussed the risks of injection without imaging, patient wishes to proceed.    [name], MD [place]

## 2022-08-12 NOTE — Patient Instructions (Signed)
It was wonderful to see you today. Thank you for allowing me to be a part of your care. Below is a short summary of what we discussed at your visit today:  Knee pain Please come back for the 2 appointments we have scheduled to get knee injections.  This should help with the knee arthritis.  Geriatric clinic Please go to your appointment for geriatric clinic on 9/14 at 2:30 PM.  This will really help me take care of you more completely and holistically.  Ophthalmology Please have your eye doctor send [place] your records after your visit with them on October 4.  I am eager to hear what they find, especially in light of the visual hallucinations you are having earlier this year.  Please bring all of your medications to every appointment!  If you have any questions or concerns, please do not hesitate to contact us via phone or MyChart message.   [name], MD

## 2022-08-13 ENCOUNTER — Other Ambulatory Visit: Payer: Self-pay

## 2022-08-13 NOTE — Telephone Encounter (Signed)
Patient calls nurse line regarding request for new glucometer supplies. Patient states that insurance is now covering onetouch supplies.   Pended to this encounter.   Talbot Grumbling, RN

## 2022-08-15 MED ORDER — ONETOUCH VERIO VI STRP
ORAL_STRIP | 12 refills | Status: DC
Start: 2022-08-15 — End: 2022-09-03

## 2022-08-15 MED ORDER — ONETOUCH VERIO W/DEVICE KIT
PACK | 0 refills | Status: DC
Start: 2022-08-15 — End: 2022-09-03

## 2022-08-15 MED ORDER — ONETOUCH DELICA LANCETS 33G MISC
12 refills | Status: AC
Start: 2022-08-15 — End: ?

## 2022-08-18 ENCOUNTER — Ambulatory Visit: Payer: No Typology Code available for payment source | Admitting: Podiatry

## 2022-08-18 NOTE — Assessment & Plan Note (Signed)
Stable.  Does have systolic hypertension with low normal diastolics.  I inherited her on a regimen of valsartan 320 mg, torsemide 10 mg, spironolactone 25 mg, and amlodipine 10 mg daily +2.5 mg as needed (for days when systolics >638).  We have discussed simplification in the past, but patient politely declined each time given that she is quite comfortable with her current regimens.  Of note, this also applies to her diabetic regimen.  Given her low normal diastolics, age, and comorbidities, I would not increase any medications.  In her, I believe the best way to measure and titrate her blood pressure is in the standing position so as to avoid orthostatic hypotension and syncope.  No changes to regimen at this time.

## 2022-08-18 NOTE — Assessment & Plan Note (Signed)
Gradual worsening despite conservative therapies.  We are limited in medication, cannot use NSAIDs due to CKD 3.  Patient is interested in steroid and lidocaine injections of her knees.  Diabetes is currently at goal.  Given no signs of infection or gout, believe this to be reasonable.  Patient has scheduled 2 appointments with myself in the future, 1 for each knee.  Discussed normal need for bilateral knee x-rays prior to injection, however patient completely declines.  Discussed the risks of injection without imaging, patient wishes to proceed.

## 2022-09-02 ENCOUNTER — Ambulatory Visit (INDEPENDENT_AMBULATORY_CARE_PROVIDER_SITE_OTHER): Payer: No Typology Code available for payment source | Admitting: Family Medicine

## 2022-09-02 VITALS — BP 164/58 | HR 74 | Ht 60.0 in | Wt 203.5 lb

## 2022-09-02 DIAGNOSIS — G3184 Mild cognitive impairment, so stated: Secondary | ICD-10-CM | POA: Diagnosis not present

## 2022-09-02 DIAGNOSIS — Z604 Social exclusion and rejection: Secondary | ICD-10-CM

## 2022-09-02 DIAGNOSIS — E039 Hypothyroidism, unspecified: Secondary | ICD-10-CM

## 2022-09-02 DIAGNOSIS — R441 Visual hallucinations: Secondary | ICD-10-CM

## 2022-09-02 DIAGNOSIS — Z6839 Body mass index (BMI) 39.0-39.9, adult: Secondary | ICD-10-CM | POA: Insufficient documentation

## 2022-09-02 DIAGNOSIS — R69 Illness, unspecified: Secondary | ICD-10-CM | POA: Diagnosis not present

## 2022-09-02 DIAGNOSIS — E1143 Type 2 diabetes mellitus with diabetic autonomic (poly)neuropathy: Secondary | ICD-10-CM | POA: Diagnosis not present

## 2022-09-02 NOTE — Progress Notes (Signed)
Conducted comprehensive medication review as part of geriatric clinic visit.   One possible medication related complaint identified.  Insomnia could possibly be related to either sertraline (taking '200mg'$  daily) or buspirone (taking '15mg'$  BID).  - Consider dose reduction trial of either medication at future PCP visit.   No additional recommendations.

## 2022-09-02 NOTE — Progress Notes (Unsigned)
h P Imaging  Brain MRI: Cerebral atrophy, small vessel disease, moderate cervical stenosis   Advanced Directives Code Status: Do Not Resuscitate if in hopeless situation Advance Directives: See VYNCA      Assessment and Plan: Please see individual consultation notes from physical therapy, pharmacy and social work for today.    Problem List Items Addressed This Visit   None  No problem-specific Assessment & Plan notes found for this encounter.      Primary Contact: Extended Emergency Contact Information Primary Emergency Contact: Brown,Jeffrey          [place] [address] [place] Phone: [phone] Relation: Son  50 minutes face to face were spent in total with precharting, history & physical gathering, nterdisciplinary discussion, patient and caretaker counseling and coordination of care and documentation. The Geriatric medical team meet to discuss the patient's Greater than 50% of this time was spent in counseling, explanation of diagnosis, planning of further management, and coordination of care.

## 2022-09-03 ENCOUNTER — Encounter: Payer: Self-pay | Admitting: Family Medicine

## 2022-09-03 ENCOUNTER — Telehealth: Payer: Self-pay

## 2022-09-03 ENCOUNTER — Ambulatory Visit: Payer: Self-pay | Admitting: Family Medicine

## 2022-09-03 DIAGNOSIS — R69 Illness, unspecified: Secondary | ICD-10-CM | POA: Diagnosis not present

## 2022-09-03 DIAGNOSIS — G3184 Mild cognitive impairment, so stated: Secondary | ICD-10-CM | POA: Insufficient documentation

## 2022-09-03 DIAGNOSIS — F39 Unspecified mood [affective] disorder: Secondary | ICD-10-CM | POA: Insufficient documentation

## 2022-09-03 HISTORY — DX: Mild cognitive impairment of uncertain or unknown etiology: G31.84

## 2022-09-03 LAB — TSH: TSH: 1.8 u[IU]/mL (ref 0.450–4.500)

## 2022-09-03 NOTE — Assessment & Plan Note (Signed)
New diagnosis MoCA 21/30 with preservation of iADLs Discussed lifestyle and social changes associated with slowed memory decline Rebecca Walter is interested in attending [facility] for social interaction and activities.

## 2022-09-03 NOTE — Telephone Encounter (Signed)
   Telephone encounter was:  Successful.  09/03/2022 Name: Rebecca Walter MRN: [id number] DOB: 31-Jul-1941  Rebecca Walter is a 81 y.o. year old female who is a primary care patient of [name], MD . The community resource team was consulted for assistance with Transportation Needs  and Senior Activities  Care guide performed the following interventions: Patient provided with information about care guide support team and interviewed to confirm resource needs. Patient advised her insurance company provides her rides to her appointments now, she is just interested in back up transportation for when rides run out. She also is interested in [employer].   Follow Up Plan:  Care guide will outreach resources to assist patient with above needs.  [place] management  [place], [place]  Main Phone: [phone]  E-mail: [email or web address]  Website: [email or web address]

## 2022-09-03 NOTE — Patient Instructions (Signed)
Mild Neurocognitive Disorder Mild neurocognitive disorder, formerly known as mild cognitive impairment, is a disorder in which memory does not work as well as it should. This disorder may also cause problems with other mental functions, including thought, communication, behavior, and completion of tasks. These problems can be noticed and measured, but they usually do not interfere with daily activities or the ability to live independently. Mild neurocognitive disorder typically develops after 83 years of age, but it can also develop at younger ages. It is not as serious as major neurocognitive disorder, also known as dementia, but it may be the first sign of it. Generally, symptoms of this condition get worse over time. In rare cases, symptoms can get better. What are the causes? This condition may be caused by: Brain disorders like Alzheimer's disease, Parkinson's disease, and other conditions that gradually damage nerve cells (neurodegenerative conditions). Diseases that affect blood vessels in the brain and result in small strokes. Certain infections, such as HIV. Traumatic brain injury. Other medical conditions, such as brain tumors, underactive thyroid (hypothyroidism), and vitamin B12 deficiency. Use of certain drugs or prescription medicines. What increases the risk? The following factors may make you more likely to develop this condition: Being older than 65 years. Being female. Low education level. Diabetes, high blood pressure, high cholesterol, and other conditions that increase the risk for blood vessel diseases. Untreated or undertreated sleep apnea. Having a certain type of gene that can be passed from parent to child (inherited). Chronic health problems such as heart disease, lung disease, liver disease, kidney disease, or depression. What are the signs or symptoms? Symptoms of this condition include: Difficulty remembering. You may: Forget names, phone numbers, or details of  recent events. Forget social events and appointments. Repeatedly forget where you put your car keys or other items. Difficulty thinking and solving problems. You may have trouble with complex tasks, such as: Paying bills. Driving in unfamiliar places. Difficulty communicating. You may have trouble: Finding the right word or naming an object. Forming a sentence that makes sense, or understanding what you read or hear. Changes in your behavior or personality. When this happens, you may: Lose interest in the things that you used to enjoy. Withdraw from social situations. Get angry more easily than usual. Act before thinking. How is this diagnosed? This condition is diagnosed based on: Your symptoms. Your health care provider may ask you and the people you spend time with, such as family and friends, about your symptoms. Evaluation of mental functions (neuropsychological testing). Your health care provider may refer you to a neurologist or mental health specialist to evaluate your mental functions in detail. To identify the cause of your condition, your health care provider may: Get a detailed medical history. Ask about use of alcohol, drugs, and prescription medicines. Do a physical exam. Order blood tests and brain imaging exams. How is this treated? Mild neurocognitive disorder that is caused by medicine use, drug use, infection, or another medical condition may improve when the cause is treated, or when medicines or drugs are stopped. If this disorder has another cause, it generally does not improve and may get worse. In these cases, the goal of treatment is to help you manage the loss of mental function. Treatments in these cases include: Medicine. Medicine mainly helps memory and behavior symptoms. Talk therapy. Talk therapy provides education, emotional support, memory aids, and other ways of making up for problems with mental function. Lifestyle changes, including: Getting regular  exercise. Eating a healthy diet   that includes omega-3 fatty acids. Challenging your thinking and memory skills. Having more social interaction. Follow these instructions at home: Eating and drinking  Drink enough fluid to keep your urine pale yellow. Eat a healthy diet that includes omega-3 fatty acids. These can be found in: Fish. Nuts. Leafy vegetables. Vegetable oils. If you drink alcohol: Limit how much you use to: 0-1 drink a day for women. 0-2 drinks a day for men. Be aware of how much alcohol is in your drink. In the [place] one drink equals one 12 oz bottle of beer (355 mL), one 5 oz glass of wine (148 mL), or one 1 oz glass of hard liquor (44 mL). Lifestyle  Get regular exercise as told by your health care provider. Do not use any products that contain nicotine or tobacco, such as cigarettes, e-cigarettes, and chewing tobacco. If you need help quitting, ask your health care provider. Practice ways to manage stress. If you need help managing stress, ask your health care provider. Continue to have social interaction. Keep your mind active with stimulating activities you enjoy, such as reading or playing games. Make sure to get quality sleep. Follow these tips: Avoid napping during the day. Keep your sleeping area dark and cool. Avoid exercising during the few hours before you go to bed. Avoid caffeine products in the evening. General instructions Take over-the-counter and prescription medicines only as told by your health care provider. Your health care provider may recommend that you avoid taking medicines that can affect thinking, such as pain medicines or sleep medicines. Work with your health care provider to find out what you need help with and what your safety needs are. Keep all follow-up visits. This is important. Where to find more information National Institute on Aging: www.nia.nih.gov Contact a health care provider if: You have any new symptoms. Get help right  away if: You develop new confusion or your confusion gets worse. You act in ways that place you or your family in danger. Summary Mild neurocognitive disorder is a disorder in which memory does not work as well as it should. Mild neurocognitive disorder can have many causes. It may be the first stage of dementia. To manage your condition, get regular exercise, keep your mind active, get quality sleep, and eat a healthy diet. This information is not intended to replace advice given to you by your health care provider. Make sure you discuss any questions you have with your health care provider. Document Revised: 04/21/2020 Document Reviewed: 04/21/2020 Elsevier Patient Education  2023 Elsevier Inc.  

## 2022-09-03 NOTE — Assessment & Plan Note (Signed)
Rebecca Walter is prescribed metoclopramide but has diagnosis of Restless Leg Syndrome. If patient has RLS, consider stopping metoclopramide, if not, then would remove diagnosis.

## 2022-09-03 NOTE — Assessment & Plan Note (Addendum)
Established problem History of subclinical hypothyroidism since at least 2018. Diagnosed by Dr Jeani Hawking with  Hypothyroidism ( TSH 26.1, FT4 0.5) and prescribed levothyroxine 25 microgram daily on 04/18/22. Her repeat TSH about two weeks later was 27.8. 08/04/22, patient reported via phone with Dr Jeani Hawking that she has not been taking her levothyroxine 50 microgram tablet regularly.  Instructed to take levothyroxine regularly as directed.   Patient reports taking on 50 microgram LT4 tablet, one daily without food. Approximately 4 weeks at this 50 microgram daily dose.   TSH drawn today

## 2022-09-03 NOTE — Assessment & Plan Note (Addendum)
Established problem Uncontrolled.  Patient continues to have well-formed visual hallucinations of people and pets. Dr Arby Barrette first note addressing patient's hallucinations was 04/15/22 when she recorded patient having onset of her hallucinations in February of this year.  The episodes were only occasional at first, but progressed to every day by her visit with Dr Jeani Hawking. Of note,  Rebecca Walter was previously started on Doxepin 6 mg oral at bedtime prn for patient's longstanding insomnia on 04/02/22.  Episodes occurs exclusively at nighttime while settling into bed  They now are occurring several times a month, with the last episode being last week. Patient first reported auditory hallucinations, but now denies them. Images are now soundless people.  Images are not threatening, nor scary to her now, though they were initially.  She does not see the hallucinations when she covers her eyes.   Her prior 6CIT cognition screen 2022 was normal.  Her TSH was significantly elevated at 27.9. levothyroxine was started by Dr Jeani Hawking.  Vitamin B12 was well WNL A brain MRI (05/18/22) was essentially unremarkable.   Today, her MoCA score was 21 out of total score of 30, and with preserved iADLs c/w Mild Cognitive Impairment.  While the differential remains broad, there are some potentially reversible causes to consider: 1. Hypnagogic & Hypnopompic hallucinations in patient on serotonergic agent.  Rebecca Walter's sertraline dose is on the higher end which could contribute to both hallucinations and insomnia.    2. Hypothyroidism, newly diagnosed and in titration phase of tretment  3. Visual release hallucination - common in older adults with some degree of visual impairment. Brain is trying to fill in visaul field which is not sending interpretable signals. The hallucinations occur with eyes open and go away with closing eyes. They tend to occur in the evening.  Rebecca Walter has some degree of visual impairment as  evidenced by her visual acuity testing today. She has history of retinal disease per last ophthalmology note treated with epiretinal peel Rebecca Halsted, MD) 2020.  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 Superficial keratectomy was being considered. I could not find if this procedure had been performed.   Other conditions considered:  Mood disorder with psychosis, though PHQ9 was only 6 with rating of "not difficult."  I do not suspect this diagnosis.  Dementia/Parkinsons Plus condition: Only Mild Cognitive Impairment on testing today.  No parkinsonian features. I do not suspect this is an explanation.    Recommendations - Gradual sertraline dose reduction - Referral for ophthalmological exam by patient's ophthalmologist. - As Rebecca Walter appears to have found an effective method of stopping or preventing the hallucinations, covering eyes, I would recommend use of antipsychotic and AED medications as they may not be beneficial and have significant side effects.

## 2022-09-07 ENCOUNTER — Telehealth: Payer: Self-pay

## 2022-09-07 NOTE — Telephone Encounter (Signed)
   Telephone encounter was:  Successful.  09/07/2022 Name: Rebecca Walter MRN: [id number] DOB: 1941/01/28  Rebecca Walter is a 81 y.o. year old female who is a primary care patient of [name], MD . The community resource team was consulted for assistance with Transportation Needs  and Senior Activities  Care guide performed the following interventions: Follow up call placed to the patient to discuss status of referral. Pt has been advised: I have mailed the following information [facility]) and if mail has not received the information in 7 to 14 days or have additional questions to please call me back at [phone]. Patient understood.  Follow Up Plan:  No further follow up planned at this time. The patient has been provided with needed resources.   [place] management  [place], [place]  Main Phone: [phone]  E-mail: [email or web address]  Website: [email or web address]

## 2022-09-10 ENCOUNTER — Encounter: Payer: Self-pay | Admitting: Family Medicine

## 2022-09-10 ENCOUNTER — Ambulatory Visit (INDEPENDENT_AMBULATORY_CARE_PROVIDER_SITE_OTHER): Payer: No Typology Code available for payment source | Admitting: Family Medicine

## 2022-09-10 VITALS — BP 162/56 | HR 77 | Ht 60.0 in | Wt 204.2 lb

## 2022-09-10 DIAGNOSIS — Z23 Encounter for immunization: Secondary | ICD-10-CM | POA: Diagnosis not present

## 2022-09-10 DIAGNOSIS — M25562 Pain in left knee: Secondary | ICD-10-CM

## 2022-09-10 DIAGNOSIS — M170 Bilateral primary osteoarthritis of knee: Secondary | ICD-10-CM | POA: Diagnosis not present

## 2022-09-10 MED ORDER — METHYLPREDNISOLONE ACETATE 40 MG/ML IJ SUSP
40.0000 mg | Freq: Once | INTRAMUSCULAR | Status: AC
  Administered 2022-09-10: 40 mg via INTRAMUSCULAR

## 2022-09-10 NOTE — Progress Notes (Unsigned)
    SUBJECTIVE:   CHIEF COMPLAINT / HPI:   Knee injections desired    PERTINENT  PMH / PSH:    OBJECTIVE:   BP (!) 162/56   Pulse 77   Ht 5' (1.524 m)   Wt 204 lb 4 oz (92.6 kg)   LMP  (LMP Unknown)   SpO2 96%   BMI 39.89 kg/m   ***  ASSESSMENT/PLAN:   No problem-specific Assessment & Plan notes found for this encounter.     [name], MD [place]

## 2022-09-10 NOTE — Patient Instructions (Signed)
It was wonderful to see you today. Thank you for allowing me to be a part of your care. Below is a short summary of what we discussed at your visit today:  Knee injection Today you received an injection with corticosteroid. This injection is usually done in response to pain and inflammation. There is some "numbing" medicine also in the shot so the injected area may be numb and feel really good for the next couple of hours. The numbing medicine usually wears off in 2-3 hours though, and then your pain level will be right back where it was before the injection.   The actually benefit from the steroid injection is usually noticed in 2-7 days. You may actually experience a small (as in 10%) INCREASE in pain in the first 24 hours---that is common.   Things to watch out for that you should contact us or a health care provider urgently would include: 1. Unusual (as in more than 10%) increase in pain 2. New fever > 101.5 3. New swelling or redness of the injected area.  4. Streaking of red lines around the area injected.  Come back for your next appointment to have the right knee injected.   Vaccines Today you received the annual flu vaccine. You may experience some residual soreness at the injection site.  Gentle stretches and regular use of that arm will help speed up your recovery.  As the vaccines are giving your immune system a "practice run" against specific infections, you may feel a little under the weather for the next several days.  We recommend rest as needed and hydrating.  Please bring all of your medications to every appointment!  If you have any questions or concerns, please do not hesitate to contact us via phone or MyChart message.   [name], MD

## 2022-09-11 DIAGNOSIS — Z23 Encounter for immunization: Secondary | ICD-10-CM | POA: Insufficient documentation

## 2022-09-11 NOTE — Assessment & Plan Note (Signed)
Tolerated left knee injection 1:4 methylprednisolone and 1% lidocaine without epi. No averse events. Will return for right knee injection in 2-3 weeks. Return precautions given.

## 2022-09-11 NOTE — Assessment & Plan Note (Signed)
Administered today, tolerated well.

## 2022-09-22 DIAGNOSIS — H18223 Idiopathic corneal edema, bilateral: Secondary | ICD-10-CM | POA: Diagnosis not present

## 2022-09-22 DIAGNOSIS — Z961 Presence of intraocular lens: Secondary | ICD-10-CM | POA: Diagnosis not present

## 2022-09-28 ENCOUNTER — Ambulatory Visit (INDEPENDENT_AMBULATORY_CARE_PROVIDER_SITE_OTHER): Payer: No Typology Code available for payment source | Admitting: Podiatry

## 2022-09-28 ENCOUNTER — Encounter: Payer: Self-pay | Admitting: Podiatry

## 2022-09-28 DIAGNOSIS — Z794 Long term (current) use of insulin: Secondary | ICD-10-CM

## 2022-09-28 DIAGNOSIS — R69 Illness, unspecified: Secondary | ICD-10-CM | POA: Diagnosis not present

## 2022-09-28 DIAGNOSIS — M79675 Pain in left toe(s): Secondary | ICD-10-CM

## 2022-09-28 DIAGNOSIS — E1143 Type 2 diabetes mellitus with diabetic autonomic (poly)neuropathy: Secondary | ICD-10-CM

## 2022-09-28 DIAGNOSIS — B351 Tinea unguium: Secondary | ICD-10-CM | POA: Diagnosis not present

## 2022-09-28 DIAGNOSIS — M79674 Pain in right toe(s): Secondary | ICD-10-CM

## 2022-09-30 ENCOUNTER — Encounter: Payer: Self-pay | Admitting: Family Medicine

## 2022-09-30 ENCOUNTER — Ambulatory Visit (INDEPENDENT_AMBULATORY_CARE_PROVIDER_SITE_OTHER): Payer: No Typology Code available for payment source | Admitting: Family Medicine

## 2022-09-30 VITALS — BP 153/60 | HR 64 | Ht 60.0 in | Wt 199.8 lb

## 2022-09-30 DIAGNOSIS — R69 Illness, unspecified: Secondary | ICD-10-CM | POA: Diagnosis not present

## 2022-09-30 DIAGNOSIS — M170 Bilateral primary osteoarthritis of knee: Secondary | ICD-10-CM | POA: Diagnosis not present

## 2022-09-30 DIAGNOSIS — Z794 Long term (current) use of insulin: Secondary | ICD-10-CM | POA: Diagnosis not present

## 2022-09-30 DIAGNOSIS — E1143 Type 2 diabetes mellitus with diabetic autonomic (poly)neuropathy: Secondary | ICD-10-CM

## 2022-09-30 DIAGNOSIS — M25561 Pain in right knee: Secondary | ICD-10-CM | POA: Diagnosis not present

## 2022-09-30 LAB — POCT GLYCOSYLATED HEMOGLOBIN (HGB A1C): HbA1c, POC (controlled diabetic range): 8.2 % — AB (ref 0.0–7.0)

## 2022-09-30 MED ORDER — METHYLPREDNISOLONE ACETATE 40 MG/ML IJ SUSP
40.0000 mg | Freq: Once | INTRAMUSCULAR | Status: AC
  Administered 2022-09-30: 40 mg via INTRAMUSCULAR

## 2022-09-30 NOTE — Patient Instructions (Signed)
It was wonderful to see you today. Thank you for allowing me to be a part of your care. Below is a short summary of what we discussed at your visit today:  Knee injection Today you received an injection with corticosteroid. This injection is usually done in response to pain and inflammation. There is some "numbing" medicine also in the shot so the injected area may be numb and feel really good for the next couple of hours. The numbing medicine usually wears off in 2-3 hours though, and then your pain level will be right back where it was before the injection.    The actually benefit from the steroid injection is usually noticed in 2-7 days. You may actually experience a small (as in 10%) INCREASE in pain in the first 24 hours---that is common.    Things to watch out for that you should contact us or a health care provider urgently would include: 1. Unusual (as in more than 10%) increase in pain 2. New fever > 101.5 3. New swelling or redness of the injected area.  4. Streaking of red lines around the area injected.   If you have any questions or concerns, please do not hesitate to contact us via phone or MyChart message.   [name], MD

## 2022-09-30 NOTE — Assessment & Plan Note (Signed)
Due for A1c check today, we will collect prior to leaving the office.

## 2022-09-30 NOTE — Assessment & Plan Note (Addendum)
Tolerated right knee injection with 1:4 methylprednisolone and 1% lidocaine without epi. No averse events. Return precautions given.

## 2022-09-30 NOTE — Progress Notes (Signed)
    SUBJECTIVE:   CHIEF COMPLAINT / HPI:   Bilateral knee arthritis, injection desired Patient seen for injection of left knee 09/10/2022.  She reports the injection helped quite a bit she desires to pursue injection of the right knee now.  Last A1c 7.9, at goal.  No knee x-ray on file given patient declined, saying she would rather avoid the extra radiation.  Risks and benefits of proceeding, especially without radiographs, discussed.  She is amenable to injection.  PERTINENT  PMH / PSH: HTN, HFpEF 70-75%, moderate pulmonary valve insufficiency, mild mitral regurg, mild aortic stenosis, OSA, dysphagia, hypothyroidism, T2DM, osteopenia, bilateral knee OA, CKD 3, HLD  OBJECTIVE:   BP (!) 153/60   Pulse 64   Ht 5' (1.524 m)   Wt 199 lb 12.8 oz (90.6 kg)   LMP  (LMP Unknown)   SpO2 97%   BMI 39.02 kg/m    Right knee: Lateral swelling on inspection and palpation Mild TTP on palpation over bilateral joint lines No erythema or warmth overlying joint Limited ROM  PROCEDURE:   KNEE INJECTION  Patient was given informed consent, signed copy in the chart. Appropriate time out was taken. Area prepped and draped in usual sterile fashion. Ethyl chloride was  used for local anesthesia. A 21 gauge 1 1/2 inch needle was used.. 1 cc of methylprednisolone 40 mg/ml plus  4 cc of 1% lidocaine without epinephrine was injected into the knee using a medial approach.   The patient tolerated the procedure well. There were no complications. Post procedure instructions were given.   ASSESSMENT/PLAN:   Osteoarthritis of both knees Tolerated right knee injection with 1:4 methylprednisolone and 1% lidocaine without epi. No averse events. Return precautions given.   Diabetes mellitus, type II ([place]) Due for A1c check today, we will collect prior to leaving the office.     Rebecca Essex, MD [place]

## 2022-10-01 ENCOUNTER — Telehealth: Payer: Self-pay | Admitting: Podiatry

## 2022-10-01 NOTE — Telephone Encounter (Signed)
Pt called to inquire about the status of ppwk for her DB shoe order; she stated that her doctor never received ppwk for the shoes, pt info has been passed on to the ortho dept

## 2022-10-03 NOTE — Progress Notes (Signed)
  Subjective:  Patient ID: Rebecca Walter, female    DOB: 13-Sep-1941,  MRN: [id number]  Rebecca Walter presents to clinic today for:  Chief Complaint  Patient presents with   Nail Problem    Diabetic foot care BS-179 A1C-7.1 PCP-Catherine [facility] PCP VST-2 weeks ago   New problem(s): None.   PCP is [name], MD , and last visit was  September 30, 2022.  Allergies  Allergen Reactions   Acyclovir     Other reaction(s): diarrhea   Baclofen Other (See Comments)    Caused leg weakness   Hydrochlorothiazide Other (See Comments)    Uric acid elevation and gout.      Liraglutide Other (See Comments)    Tongue Glossitus   Losartan Potassium     Other reaction(s): diarrhea   Lotensin [Benazepril] Other (See Comments)    Dry cough    Benazepril Hcl     Other reaction(s): dry cough   Metoprolol Succinate [Metoprolol]     Other reaction(s): coughing   Beta Adrenergic Blockers Other (See Comments)    Occurred with metoprolol  coughing   Other Diarrhea and Other (See Comments)    Occurred with metoprolol  REACTION: dry coughing    Review of Systems: Negative except as noted in the HPI.  Objective: No changes noted in today's physical examination.  Rebecca Walter is a pleasant 81 y.o. female obese in NAD. AAO x 3. Neurovascular Examination: Capillary refill time to digits immediate b/l. Palpable pedal pulses b/l LE. Pedal hair sparse. No pain with calf compression b/l. Lower extremity skin temperature gradient within normal limits. No edema noted b/l LE. No cyanosis or clubbing noted b/l LE.  Protective sensation intact 5/5 intact bilaterally with 10g monofilament b/l. Vibratory sensation intact b/l.  Dermatological:  Pedal integument with normal turgor, texture and tone BLE. No open wounds b/l LE. No interdigital macerations noted b/l LE.   Toenails 1-5 b/l elongated, discolored, dystrophic, thickened, crumbly with subungual debris and tenderness to dorsal palpation.  Incurvated nailplate right great toe medial border(s) with tenderness to palpation. No erythema, no edema, no drainage noted.   Musculoskeletal:  Normal muscle strength 5/5 to all lower extremity muscle groups bilaterally. Hammertoe(s) noted to the bilateral 2nd toes. No pain, crepitus or joint limitation noted with ROM b/l LE.  Patient ambulates independently without assistive aids.  Assessment/Plan: 1. Pain due to onychomycosis of toenails of both feet   2. Type 2 diabetes mellitus with diabetic autonomic neuropathy, with long-term current use of insulin (HCC)     No orders of the defined types were placed in this encounter.   -Consent given for treatment as described below: -Examined patient. -Stressed the importance of good glycemic control and the detriment of not  controlling glucose levels in relation to the foot. -Mycotic toenails 1-5 bilaterally were debrided in length and girth with sterile nail nippers and dremel. Pinpoint bleeding of L 4th toe addressed with Lumicain Hemostatic Solution, cleansed with alcohol. triple antibiotic ointment applied. No further treatment required by patient/caregiver. -Patient/POA to call should there be question/concern in the interim.   Return in about 3 months (around 12/29/2022).  Marzetta Board, DPM

## 2022-10-07 ENCOUNTER — Other Ambulatory Visit: Payer: Self-pay | Admitting: Family Medicine

## 2022-10-16 ENCOUNTER — Other Ambulatory Visit: Payer: Self-pay | Admitting: Family Medicine

## 2022-10-16 DIAGNOSIS — I340 Nonrheumatic mitral (valve) insufficiency: Secondary | ICD-10-CM

## 2022-10-16 DIAGNOSIS — R7989 Other specified abnormal findings of blood chemistry: Secondary | ICD-10-CM

## 2022-10-16 DIAGNOSIS — E039 Hypothyroidism, unspecified: Secondary | ICD-10-CM

## 2022-10-18 MED ORDER — LEVOTHYROXINE SODIUM 50 MCG PO TABS: 50.0000 ug | ORAL_TABLET | ORAL | 3 refills | Status: DC

## 2022-10-18 NOTE — Telephone Encounter (Signed)
Received refill requests.  The requested levothyroxine was the previous dosage, 25 mcg, so I refused that one.  I have sent in a prescription for her proper dose of levothyroxine, 50 mcg.  [name], MD

## 2022-10-22 ENCOUNTER — Other Ambulatory Visit: Payer: Self-pay | Admitting: Family Medicine

## 2022-10-22 DIAGNOSIS — I10 Essential (primary) hypertension: Secondary | ICD-10-CM

## 2022-10-25 DIAGNOSIS — H16223 Keratoconjunctivitis sicca, not specified as Sjogren's, bilateral: Secondary | ICD-10-CM | POA: Diagnosis not present

## 2022-10-25 DIAGNOSIS — H179 Unspecified corneal scar and opacity: Secondary | ICD-10-CM | POA: Diagnosis not present

## 2022-10-25 DIAGNOSIS — Z961 Presence of intraocular lens: Secondary | ICD-10-CM | POA: Diagnosis not present

## 2022-10-25 DIAGNOSIS — R69 Illness, unspecified: Secondary | ICD-10-CM | POA: Diagnosis not present

## 2022-10-28 DIAGNOSIS — Z6838 Body mass index (BMI) 38.0-38.9, adult: Secondary | ICD-10-CM | POA: Diagnosis not present

## 2022-10-28 DIAGNOSIS — I129 Hypertensive chronic kidney disease with stage 1 through stage 4 chronic kidney disease, or unspecified chronic kidney disease: Secondary | ICD-10-CM | POA: Diagnosis not present

## 2022-10-28 DIAGNOSIS — Z008 Encounter for other general examination: Secondary | ICD-10-CM | POA: Diagnosis not present

## 2022-11-03 ENCOUNTER — Other Ambulatory Visit: Payer: Self-pay | Admitting: Family Medicine

## 2022-11-15 ENCOUNTER — Telehealth: Payer: Self-pay

## 2022-11-15 NOTE — Telephone Encounter (Signed)
Pt is paying $3 for sertraline. Pt is requesting a lower out of pocket cost. Please send new Rx if needed. Rebecca Walter, CMA

## 2022-11-19 ENCOUNTER — Telehealth: Payer: Self-pay | Admitting: Family Medicine

## 2022-11-19 ENCOUNTER — Telehealth: Payer: Self-pay

## 2022-11-19 DIAGNOSIS — F419 Anxiety disorder, unspecified: Secondary | ICD-10-CM

## 2022-11-19 DIAGNOSIS — K3184 Gastroparesis: Secondary | ICD-10-CM

## 2022-11-19 DIAGNOSIS — I10 Essential (primary) hypertension: Secondary | ICD-10-CM

## 2022-11-19 NOTE — Telephone Encounter (Signed)
Patient calls nurse line requesting refills on Amlodipine, Zoloft and Metoclopramide.   Will forward to PCP.

## 2022-11-19 NOTE — Telephone Encounter (Signed)
Patients son dropped off form to be completed by pcp.  Put in [employer]

## 2022-11-19 NOTE — Telephone Encounter (Signed)
Clinical info completed on [employer] form.  Placed form in Dr Arby Barrette box for completion.    When form is completed, please route note to "RN Team" and place in wall pocket in front office.   Ottis Stain, CMA

## 2022-11-22 MED ORDER — SERTRALINE HCL 50 MG PO TABS: ORAL_TABLET | ORAL | 4 refills | Status: AC

## 2022-11-22 MED ORDER — METOCLOPRAMIDE HCL 5 MG PO TABS: ORAL_TABLET | ORAL | 2 refills | Status: DC

## 2022-11-22 MED ORDER — SERTRALINE HCL 100 MG PO TABS: 100.0000 mg | ORAL_TABLET | Freq: Every day | ORAL | 4 refills | Status: DC

## 2022-11-22 MED ORDER — AMLODIPINE BESYLATE 10 MG PO TABS: 10.0000 mg | ORAL_TABLET | Freq: Every day | ORAL | 3 refills | Status: DC

## 2022-11-22 NOTE — Telephone Encounter (Signed)
Ordered refills as requested.  [name], MD

## 2022-11-24 NOTE — Telephone Encounter (Signed)
Form placed up front for pick up.   Copy made for batch scanning.   VM left for son.

## 2022-11-30 DIAGNOSIS — I5032 Chronic diastolic (congestive) heart failure: Secondary | ICD-10-CM | POA: Diagnosis not present

## 2022-11-30 DIAGNOSIS — N1832 Chronic kidney disease, stage 3b: Secondary | ICD-10-CM | POA: Diagnosis not present

## 2022-11-30 DIAGNOSIS — Z008 Encounter for other general examination: Secondary | ICD-10-CM | POA: Diagnosis not present

## 2022-11-30 DIAGNOSIS — I130 Hypertensive heart and chronic kidney disease with heart failure and stage 1 through stage 4 chronic kidney disease, or unspecified chronic kidney disease: Secondary | ICD-10-CM | POA: Diagnosis not present

## 2022-11-30 DIAGNOSIS — E261 Secondary hyperaldosteronism: Secondary | ICD-10-CM | POA: Diagnosis not present

## 2022-11-30 DIAGNOSIS — Z6838 Body mass index (BMI) 38.0-38.9, adult: Secondary | ICD-10-CM | POA: Diagnosis not present

## 2022-12-01 ENCOUNTER — Telehealth: Payer: Self-pay

## 2022-12-01 NOTE — Telephone Encounter (Signed)
Patient calls nurse line requesting lab work.   She reports she spoke with a nurse yesterday that works for her insurance. She reports she called her after hours due to leg swelling and SOB. She reports the NP told her to have lab work drawn.   Patient advised if she is experiencing leg swelling and SOB she needs to be evaluated in clinic and not just have labs drawn. Patient reports she is feeling better today and denies any current SOB.   She reports her leg left is still swollen and tender to touch. She reports some redness as well. She reports her right leg has gone down.   She denies any fevers or body aches. No SOB or chest pains.   Patient scheduled for Friday with PCP due to transportation reasons.   Strict ED precautions given to patient.

## 2022-12-03 ENCOUNTER — Ambulatory Visit (INDEPENDENT_AMBULATORY_CARE_PROVIDER_SITE_OTHER): Payer: No Typology Code available for payment source | Admitting: Family Medicine

## 2022-12-03 ENCOUNTER — Encounter: Payer: Self-pay | Admitting: Family Medicine

## 2022-12-03 VITALS — BP 132/60 | HR 72 | Temp 98.0°F | Ht 60.0 in | Wt 209.0 lb

## 2022-12-03 DIAGNOSIS — L03116 Cellulitis of left lower limb: Secondary | ICD-10-CM

## 2022-12-03 DIAGNOSIS — M7989 Other specified soft tissue disorders: Secondary | ICD-10-CM | POA: Diagnosis not present

## 2022-12-03 DIAGNOSIS — I152 Hypertension secondary to endocrine disorders: Secondary | ICD-10-CM

## 2022-12-03 DIAGNOSIS — K5900 Constipation, unspecified: Secondary | ICD-10-CM

## 2022-12-03 DIAGNOSIS — E1159 Type 2 diabetes mellitus with other circulatory complications: Secondary | ICD-10-CM | POA: Diagnosis not present

## 2022-12-03 HISTORY — DX: Cellulitis of left lower limb: L03.116

## 2022-12-03 MED ORDER — POLYETHYLENE GLYCOL 3350 17 GM/SCOOP PO POWD: 17.0000 g | Freq: Every day | ORAL | 1 refills | Status: AC

## 2022-12-03 MED ORDER — CEPHALEXIN 500 MG PO CAPS: 500.0000 mg | ORAL_CAPSULE | Freq: Two times a day (BID) | ORAL | 0 refills | Status: AC

## 2022-12-03 NOTE — Assessment & Plan Note (Addendum)
At goal today. Plan to titrate to diastolic while standing given age and history of iatrogenic bradycardia and orthostasis. Continue regimen with no changes. Torsemide back to routine dose. Instructed patient to not start carvedilol - pulse normal.

## 2022-12-03 NOTE — Assessment & Plan Note (Signed)
-   Miralax daily

## 2022-12-03 NOTE — Patient Instructions (Addendum)
It was wonderful to see you today. Thank you for allowing me to be a part of your care. Below is a short summary of what we discussed at your visit today:  Blood pressure Today your blood pressure looked excellent! Do not pick up or start the carvedilol.  This class of drugs in the past has caused you to cough and wheeze. Stop the extra torsemide, you should be done with the 3 days of double dose. Continue your normal dose of torsemide, just 1 pill. Today we are collecting blood work to check your thyroid, kidney function, liver function, and electrolytes. If the results are normal, I will send you a letter or MyChart message. If the results are abnormal, I will give you a call.    Skin infection Start Keflex 500 mg twice daily for 7 days. Get appointment to come back on Monday or Tuesday for follow-up to make sure that your skin infection is clearing up with this antibiotic.  Please bring all of your medications to every appointment!  If you have any questions or concerns, please do not hesitate to contact us via phone or MyChart message.   [name], MD

## 2022-12-03 NOTE — Progress Notes (Addendum)
    SUBJECTIVE:   CHIEF COMPLAINT / HPI:   BP concerns Was evaluated over the phone by an insurance-related home health provider for routine BP evaluation. This NP recorded in her note home BP measurements of 170s/100s. She (a) instructed patient to double torsemide x3 days, (b) start carvedilol 3.125 mg, (c) start Jardiance, and (d) go to PCP for BMP. She sent scripts for carvedilol and jardiance to the pharmacy.  Patient here today, reports she doubled the torsemide for 3 days as instructed, last day yesterday. She has not yet picked up and started the carvedilol.   Metoprolol and beta blockers are on this patient's allergy/contraindication list due to cough and wheeze.   While I also believe this patient would greatly benefit from an SGLT-2 inhibitor, we have previously tried both [place] and [place] through insurance, but she could afford neither of the co-pays. We attempted to connect her with a patient assistance program, but this appears to have been unsuccessful. We have discussed trying GoodRx. Patient has not yet found a way to afford this medication.   BP Readings from Last 3 Encounters:  12/03/22 132/60  09/30/22 (!) 153/60  09/10/22 (!) 162/56    Leg redness and swelling One week of bilateral leg swelling, subjective fever and chills. Right leg swelling has resolved. Left still a bit swollen and has developed redness and warmth over anterior shin. No known injuries or lacerations. She is a few months out from bilateral knee injections, but the rash does not involve the knee.   PERTINENT  PMH / PSH: HFpEF 70%, HTN, T2DM, diabetic neuropathy, HLD, CKD3, RLS  OBJECTIVE:   BP 132/60   Pulse 72   Temp 98 F (36.7 C) (Oral)   Ht 5' (1.524 m)   Wt 209 lb (94.8 kg)   LMP  (LMP Unknown)   SpO2 98%   BMI 40.82 kg/m    Gen: awake, alert, NAD Resp: speaking clearly in full sentences Ext: trace pitting edema of right ankle, 1+ pitting edema of left ankle to level of mid-shin,  left shin hot to touch with erythematous coalescent papular rash with raised serpentine edges extending from 2" superior to ankle to 3" inferior to patella  ASSESSMENT/PLAN:   Hypertension associated with diabetes ([place]) At goal today. Plan to titrate to diastolic while standing given age and history of iatrogenic bradycardia and orthostasis. Continue regimen with no changes. Torsemide back to routine dose. Instructed patient to not start carvedilol - pulse normal.   Cellulitis of left lower extremity 1 week duration of worsening. No clear source. Does not involve or overly joints. Rx keflex 500 mg BID x7 days. Return early next week for follow up to ensure improvement.   Constipation Miralax daily.      [name], MD [place]

## 2022-12-03 NOTE — Assessment & Plan Note (Signed)
1 week duration of worsening. No clear source. Does not involve or overly joints. Rx keflex 500 mg BID x7 days. Return early next week for follow up to ensure improvement.

## 2022-12-04 LAB — COMPREHENSIVE METABOLIC PANEL
ALT: 17 IU/L (ref 0–32)
AST: 21 IU/L (ref 0–40)
Albumin/Globulin Ratio: 1.8 (ref 1.2–2.2)
Albumin: 4.4 g/dL (ref 3.7–4.7)
Alkaline Phosphatase: 55 IU/L (ref 44–121)
BUN/Creatinine Ratio: 31 — ABNORMAL HIGH (ref 12–28)
BUN: 35 mg/dL — ABNORMAL HIGH (ref 8–27)
Bilirubin Total: <0.2 mg/dL (ref 0.0–1.2)
CO2: 19 mmol/L — ABNORMAL LOW (ref 20–29)
Calcium: 9.4 mg/dL (ref 8.7–10.3)
Chloride: 101 mmol/L (ref 96–106)
Creatinine, Ser: 1.14 mg/dL — ABNORMAL HIGH (ref 0.57–1.00)
Globulin, Total: 2.4 g/dL (ref 1.5–4.5)
Glucose: 106 mg/dL — ABNORMAL HIGH (ref 70–99)
Potassium: 4.3 mmol/L (ref 3.5–5.2)
Sodium: 139 mmol/L (ref 134–144)
Total Protein: 6.8 g/dL (ref 6.0–8.5)
eGFR: 48 mL/min/{1.73_m2} — ABNORMAL LOW (ref >59)

## 2022-12-04 LAB — TSH: TSH: 3.09 u[IU]/mL (ref 0.450–4.500)

## 2022-12-07 ENCOUNTER — Other Ambulatory Visit: Payer: Self-pay | Admitting: Family Medicine

## 2022-12-07 ENCOUNTER — Ambulatory Visit (INDEPENDENT_AMBULATORY_CARE_PROVIDER_SITE_OTHER): Payer: No Typology Code available for payment source | Admitting: Family Medicine

## 2022-12-07 ENCOUNTER — Encounter: Payer: Self-pay | Admitting: Family Medicine

## 2022-12-07 VITALS — BP 144/58 | HR 72 | Ht 60.0 in | Wt 210.0 lb

## 2022-12-07 DIAGNOSIS — L570 Actinic keratosis: Secondary | ICD-10-CM | POA: Diagnosis not present

## 2022-12-07 DIAGNOSIS — R21 Rash and other nonspecific skin eruption: Secondary | ICD-10-CM | POA: Diagnosis not present

## 2022-12-07 DIAGNOSIS — L308 Other specified dermatitis: Secondary | ICD-10-CM | POA: Diagnosis not present

## 2022-12-07 DIAGNOSIS — R7989 Other specified abnormal findings of blood chemistry: Secondary | ICD-10-CM

## 2022-12-07 MED ORDER — TRIAMCINOLONE ACETONIDE 0.5 % EX OINT: 1.0000 | TOPICAL_OINTMENT | Freq: Two times a day (BID) | CUTANEOUS | 0 refills | Status: DC

## 2022-12-07 MED ORDER — HYDROCORTISONE 1 % EX CREA: 1.0000 | TOPICAL_CREAM | Freq: Two times a day (BID) | CUTANEOUS | 0 refills | Status: DC

## 2022-12-07 NOTE — Patient Instructions (Signed)
It was wonderful to see you today.  Please bring ALL of your medications with you to every visit.   Today we talked about:  We did a punch biopsy of your rash, we will call you with the results.  Thank you for choosing [facility].   Please call [phone] with any questions about today's appointment.  Please be sure to schedule follow up in 1-2 weeks at the front  desk before you leave today.   Please arrive at least 15 minutes prior to your scheduled appointments.   If you had blood work today, I will send you a MyChart message or a letter if results are normal. Otherwise, I will give you a call.   If you had a referral placed, they will call you to set up an appointment. Please give [place] a call if you don't hear back in the next 2 weeks.   If you need additional refills before your next appointment, please call your pharmacy first.

## 2022-12-07 NOTE — Assessment & Plan Note (Signed)
Recently treated as a cellulitis as it originally started out on one leg. Given that the rash has now extended and is present bilaterally, do not feel cellulitis is likely. Presentation/distribution is odd as it is not circumferential, doesn't involve the ankles or feet and seems well circumscribed in the affected regions with varying levels of erythema. Differential includes vascular dermatitis (less likely) or a vasculitis type of picture.  - Biopsy was performed - Trial of Kenalog with strict instructions to discontinue if worsening

## 2022-12-07 NOTE — Progress Notes (Signed)
    SUBJECTIVE:   CHIEF COMPLAINT / HPI:   Leg rashes and swelling - Was seen last week and treated for cellulitis with Keflex - She feels like there has been some mild improvement in symptoms - Now redness is present on both legs (and per the description from the last note, feel like it has extended) - Has swelling in the legs and has gained about 9 pounds - Denies any shortness of breath at this time  - Reports only using vaseline and non-scented soap to the legs - Has compression stockings for the swelling  PERTINENT  PMH / PSH: Reviewed   OBJECTIVE:   BP (!) 144/58   Pulse 72   Ht 5' (1.524 m)   Wt 210 lb (95.3 kg)   LMP  (LMP Unknown)   SpO2 95%   BMI 41.01 kg/m   General: NAD, well-appearing, well-nourished Respiratory: No respiratory distress, breathing comfortably, able to speak in full sentences Skin: erythema present on BLE that is not circumferential, mildly warm to the touch and slightly painful. See pictures below        Punch Biopsy of Left Lower Extremity After informed written consent was obtained, using alcohol for cleansing and 1% Lidocaine with epinephrine for anesthetic, with sterile technique a 3 mm punch biopsy was used to obtain a biopsy specimen of the lesion. Hemostasis was obtained by pressure and wound was not sutured. Antibiotic dressing is applied, and wound care instructions provided. Be alert for any signs of cutaneous infection. The specimen is labeled and sent to pathology for evaluation. The procedure was well tolerated without complications.   ASSESSMENT/PLAN:   Skin rash Recently treated as a cellulitis as it originally started out on one leg. Given that the rash has now extended and is present bilaterally, do not feel cellulitis is likely. Presentation/distribution is odd as it is not circumferential, doesn't involve the ankles or feet and seems well circumscribed in the affected regions with varying levels of erythema. Differential  includes vascular dermatitis (less likely) or a vasculitis type of picture.  - Biopsy was performed - Trial of Kenalog with strict instructions to discontinue if worsening   Hemorrhoids -Preparation H sent in   Rise Patience, [place]

## 2022-12-10 ENCOUNTER — Ambulatory Visit (INDEPENDENT_AMBULATORY_CARE_PROVIDER_SITE_OTHER): Payer: No Typology Code available for payment source | Admitting: Student

## 2022-12-10 ENCOUNTER — Encounter: Payer: Self-pay | Admitting: Student

## 2022-12-10 VITALS — BP 138/64 | HR 74 | Ht 60.0 in | Wt 207.0 lb

## 2022-12-10 DIAGNOSIS — R21 Rash and other nonspecific skin eruption: Secondary | ICD-10-CM | POA: Diagnosis not present

## 2022-12-10 NOTE — Progress Notes (Signed)
    SUBJECTIVE:   CHIEF COMPLAINT / HPI: Erythema and swelling of bilateral legs  Seen 12/15 for cellulitis of left lower extremity and given Keflex 500 twice daily for 7 days.  On 12/19 came in due to redness in both legs as well as swelling and and gained 9 pounds.  Punch biopsy of lower extremity was obtained and given trial of Kenalog.  Thought to be related possibly to vascular dermatitis or vasculitis.  I called pathology department at [phone] and they let me know that read is not back yet, no preliminary result and should be expected to result a few days after Christmas.  Today feels that it is improved greatly although on the left side it has extended towards the back somewhat.  He also has noticed that her weight is down. Feels like she was allergic to compression stockings as she noticed that it was right below her knee.  Denies any systemic symptoms of fevers that are known or vomiting.  Denies any shortness of breath currently and denies any cough.  PERTINENT  PMH / PSH: T2DM, CKD 3, HFpEF  OBJECTIVE:   BP 138/64   Pulse 74   Ht 5' (1.524 m)   Wt 207 lb (93.9 kg)   LMP  (LMP Unknown)   SpO2 96%   BMI 40.43 kg/m   General: NAD, awake, alert, responsive to questions Head: Normocephalic atraumatic CV: Regular rate and rhythm no murmurs rubs or gallops Respiratory: Clear to ausculation bilaterally, no wheezes rales or crackles, chest rises symmetrically,  no increased work of breathing Abdomen: Soft, non-tender Extremities: Moves upper and lower extremities freely, 2+ pitting edema to the knee bilaterally, erythema extending to right below knee, no issues in feet, neurovascularly intact, nontender to palpation, mild warmth to touch, no calf tenderness      ASSESSMENT/PLAN:   Skin rash Skin rash bilaterally up to knees.  No systemic symptoms.  Has already completed antibiotic course for presumed cellulitis.  She feels that it is better than previously.  She has been  placing triamcinolone cream on the area bilaterally some improvement although on pictures does seem to be extended on the left side.  Pathology reports are not back yet for skin biopsy.  Given patient had worn compression stockings up to knees bilaterally could consider a contact dermatitis as potential cause. -Continue triamcinolone twice daily -Follow-up next week 12/28, hopeful pathology results by then    Gerrit Heck, MD [place]

## 2022-12-10 NOTE — Assessment & Plan Note (Signed)
Skin rash bilaterally up to knees.  No systemic symptoms.  Has already completed antibiotic course for presumed cellulitis.  She feels that it is better than previously.  She has been placing triamcinolone cream on the area bilaterally some improvement although on pictures does seem to be extended on the left side.  Pathology reports are not back yet for skin biopsy.  Given patient had worn compression stockings up to knees bilaterally could consider a contact dermatitis as potential cause. -Continue triamcinolone twice daily -Follow-up next week 12/28, hopeful pathology results by then

## 2022-12-10 NOTE — Patient Instructions (Addendum)
It was great to see you! Thank you for allowing me to participate in your care!   Our plans for today:  - This is possibly from your compressions stockings as a contact dermatitis > please continue the triamcinolone ointment as prescribed - Please follow up next week with me to check on how you are doing and see if your biopsy results come back -Please contact transportation today  Take care and seek immediate care sooner if you develop any concerns.  Gerrit Heck, MD

## 2022-12-16 ENCOUNTER — Encounter: Payer: Self-pay | Admitting: Student

## 2022-12-16 ENCOUNTER — Ambulatory Visit (INDEPENDENT_AMBULATORY_CARE_PROVIDER_SITE_OTHER): Payer: No Typology Code available for payment source | Admitting: Student

## 2022-12-16 ENCOUNTER — Other Ambulatory Visit: Payer: Self-pay

## 2022-12-16 VITALS — BP 151/48 | HR 85 | Wt 205.0 lb

## 2022-12-16 DIAGNOSIS — Z008 Encounter for other general examination: Secondary | ICD-10-CM | POA: Diagnosis not present

## 2022-12-16 DIAGNOSIS — E1159 Type 2 diabetes mellitus with other circulatory complications: Secondary | ICD-10-CM

## 2022-12-16 DIAGNOSIS — I130 Hypertensive heart and chronic kidney disease with heart failure and stage 1 through stage 4 chronic kidney disease, or unspecified chronic kidney disease: Secondary | ICD-10-CM | POA: Diagnosis not present

## 2022-12-16 DIAGNOSIS — I152 Hypertension secondary to endocrine disorders: Secondary | ICD-10-CM

## 2022-12-16 DIAGNOSIS — R21 Rash and other nonspecific skin eruption: Secondary | ICD-10-CM

## 2022-12-16 NOTE — Progress Notes (Signed)
    SUBJECTIVE:   CHIEF COMPLAINT / HPI:   Skin rash Patient seen multiple times for lower extremity rash and swelling.  Punch biopsy has resulted which shows actinic keratosis with superimposed spongiotic dermatitis (contact or nummular).  Patient has been placing triamcinolone on legs and feels that it has improved greatly.  Systolic HTN Regimen of valsartan 320 mg, torsemide 10 mg, spironolactone 25 mg, and amlodipine 10 mg daily +2.5 mg as needed (for days when systolics >599).  She says she felt lightheaded 1 time when she was bending over but has not felt lightheaded since then and is not a daily occurrence.  Her blood pressure at home and its usually 150s to 160s over 70s.  PERTINENT  PMH / PSH:   OBJECTIVE:   BP (!) 151/48   Pulse 85   Wt 205 lb (93 kg)   LMP  (LMP Unknown)   SpO2 92%   BMI 40.04 kg/m   General: Well appearing, NAD, awake, alert, responsive to questions Head: Normocephalic atraumatic Respiratory: chest rises symmetrically,  no increased work of breathing Abdomen: Soft, non-tender, non-distended, normoactive bowel sounds  Extremities: Moves upper and lower extremities freely, improved rash on bilateral shins, no erythema but postinflammatory hyperpigmentation present, 2+ pitting edema bilaterally      ASSESSMENT/PLAN:   Hypertension associated with diabetes ([place]) 161/55 and on recheck was 151/48.  Patient is on a lot of antihypertensives currently.  Had 1 episode where she felt lightheaded but none that are consistent.  She says at home her diastolics usually run in the 65s.  I would like reduce/simplify her regimen and she is amenable to this.  Will start with stopping her as needed amlodipine 2.5 mg for systolics greater than 357.  Patient is amenable to this change. -Continue valsartan 320 mg -Continue torsemide 10 mg -Continue spironolactone 25 mg -Continue amlodipine 10 mg daily -Monitor home blood pressures -Consider simplifying regimen in  future pending BP at future visits  Skin rash Punch biopsy most consistent with contact dermatitis.  Did have overlying actinic keratosis at that specific punch biopsy area.  Discussed to continue the triamcinolone ointment for her legs.  She is doing much better and rash is much improved from previously. -Monitor at future visits -Avoid particular compression stockings that she had used prior   Gerrit Heck, MD [place]

## 2022-12-16 NOTE — Assessment & Plan Note (Signed)
161/55 and on recheck was 151/48.  Patient is on a lot of antihypertensives currently.  Had 1 episode where she felt lightheaded but none that are consistent.  She says at home her diastolics usually run in the 24s.  I would like reduce/simplify her regimen and she is amenable to this.  Will start with stopping her as needed amlodipine 2.5 mg for systolics greater than 761.  Patient is amenable to this change. -Continue valsartan 320 mg -Continue torsemide 10 mg -Continue spironolactone 25 mg -Continue amlodipine 10 mg daily -Monitor home blood pressures -Consider simplifying regimen in future pending BP at future visits

## 2022-12-16 NOTE — Patient Instructions (Signed)
It was great to see you! Thank you for allowing me to participate in your care!   Our plans for today:  - I would like to stop the 2.5 mg as needed amlodipine that you take at home. (Continue the amlodipine at 10 mg daily), we may need to come down on your blood pressure medicines in the future if you start to feel lightheaded or have any falls -Your skin rash looks much better, the pathology report looks like a likely contact dermatitis.  Continue doing the triamcinolone ointment on these areas.  Take care and seek immediate care sooner if you develop any concerns.  Gerrit Heck, MD

## 2022-12-16 NOTE — Assessment & Plan Note (Addendum)
Punch biopsy most consistent with contact dermatitis.  Did have overlying actinic keratosis at that specific punch biopsy area.  Discussed to continue the triamcinolone ointment for her legs.  She is doing much better and rash is much improved from previously. -Monitor at future visits -Avoid particular compression stockings that she had used prior

## 2022-12-22 ENCOUNTER — Telehealth: Payer: Self-pay

## 2022-12-22 NOTE — Telephone Encounter (Signed)
Patient calls nurse line requesting a muscle relaxer for her "bowels."   She reports she has been constipated over the last several days. She reports she was able to have a "small one" last night, however feels she has not relieved herself. She denies any blood.   She reports this is causing pain in her abdomen and she reports a muscle relaxer will relax her enough to "go."   Patient advised she may need an apt.   Precautions discussed.   Will forward to PCP.

## 2022-12-23 ENCOUNTER — Encounter: Payer: Self-pay | Admitting: Student

## 2022-12-23 ENCOUNTER — Ambulatory Visit (INDEPENDENT_AMBULATORY_CARE_PROVIDER_SITE_OTHER): Payer: No Typology Code available for payment source | Admitting: Student

## 2022-12-23 VITALS — BP 128/60 | HR 76 | Ht 60.0 in | Wt 204.4 lb

## 2022-12-23 DIAGNOSIS — F419 Anxiety disorder, unspecified: Secondary | ICD-10-CM | POA: Diagnosis not present

## 2022-12-23 DIAGNOSIS — K5909 Other constipation: Secondary | ICD-10-CM | POA: Diagnosis not present

## 2022-12-23 NOTE — Progress Notes (Signed)
    SUBJECTIVE:   CHIEF COMPLAINT / HPI:   Rebecca Walter is an 82 year old female here for numbness all over (that has since resolved) and constipation.  She says that she felt numbness in her extremities.  She says she had notes that she has anxiety and this could be from that.  She did not have any focal deficits including weakness, difficulty with word finding or speech.  She did not have any headaches or visual changes.  Her most bothersome symptom today is constipation she has been having for the past couple of weeks.  Last BM was 1/1 or 1/2.  She denies any significant abdominal pain. She says that she is taking MiraLAX a couple of times, but is not taking it consistently.  Doing pretty well regarding anxiety. Having to move from apartment to senior living community at [place] area. She is a bit stressed about the move but she is excited.  PERTINENT  PMH / PSH: Hypertension, generalized anxiety,  OBJECTIVE:   BP 128/60   Pulse 76   Ht 5' (1.524 m)   Wt 204 lb 6.4 oz (92.7 kg)   LMP  (LMP Unknown)   SpO2 94%   BMI 39.92 kg/m  General: Alert and cooperative and appears to be in no acute distress Cardio: Normal S1 and S2, no S3 or S4. Rhythm is regular. No murmurs or rubs.   Pulm: Clear to auscultation bilaterally, no crackles, wheezing, or diminished breath sounds. Normal respiratory effort Abdomen: Bowel sounds normal. Abdomen soft and non-tender.  Extremities: No peripheral edema. Warm/ well perfused.  Strong radial pulses. Neuro: Cranial nerves grossly intact    ASSESSMENT/PLAN:   Anxiety Stable.  Was pleasant today.  Seems to have a good understanding of the mind-body connection. Anxious about upcoming move, but overall doing well.   Constipation No concern for SBO.  Abdomen soft nontender.  Encourage plenty of fluid intake. Recommend starting MiraLAX his daily regimen with discussed titration to 2 capfuls daily if 1 capful did not provide a normal bowel movement  in 3 days. Discussed she may use an enema if needed   Follow-up with PCP in 2 weeks if no improvement  Orvis Brill, [place]

## 2022-12-26 NOTE — Assessment & Plan Note (Signed)
Stable.  Was pleasant today.  Seems to have a good understanding of the mind-body connection. Anxious about upcoming move, but overall doing well.

## 2022-12-26 NOTE — Assessment & Plan Note (Addendum)
No concern for SBO.  Abdomen soft nontender.  Encourage plenty of fluid intake. Recommend starting MiraLAX his daily regimen with discussed titration to 2 capfuls daily if 1 capful did not provide a normal bowel movement in 3 days. Discussed she may use an enema if needed

## 2022-12-30 ENCOUNTER — Other Ambulatory Visit: Payer: Self-pay | Admitting: Family Medicine

## 2022-12-30 ENCOUNTER — Telehealth: Payer: Self-pay | Admitting: *Deleted

## 2022-12-30 DIAGNOSIS — M5416 Radiculopathy, lumbar region: Secondary | ICD-10-CM

## 2022-12-30 DIAGNOSIS — L02415 Cutaneous abscess of right lower limb: Secondary | ICD-10-CM | POA: Diagnosis not present

## 2022-12-30 DIAGNOSIS — E1143 Type 2 diabetes mellitus with diabetic autonomic (poly)neuropathy: Secondary | ICD-10-CM

## 2022-12-30 NOTE — Telephone Encounter (Signed)
Patient left voicemail stating that is having an issue getting her insulin (novolog) filled by the pharmacy due to coverage.  She is also requesting a refill of baclofen for her leg swelling/pain.  Will forward to MD.  Rebecca Walter

## 2022-12-31 ENCOUNTER — Ambulatory Visit: Payer: No Typology Code available for payment source | Admitting: Family Medicine

## 2022-12-31 MED ORDER — BACLOFEN 10 MG PO TABS: 5.0000 mg | ORAL_TABLET | Freq: Two times a day (BID) | ORAL | 0 refills | Status: DC | PRN

## 2022-12-31 NOTE — Telephone Encounter (Signed)
Refilled baclofen. Was noted in allergy list in 2019 to cause leg weakness, however patient was taking through summer 2023 without issue. Will refill.   [name], MD

## 2023-01-03 ENCOUNTER — Other Ambulatory Visit (HOSPITAL_COMMUNITY): Payer: Self-pay

## 2023-01-05 MED ORDER — INSULIN ASPART (W/NIACINAMIDE) 100 UNIT/ML ~~LOC~~ SOPN: PEN_INJECTOR | SUBCUTANEOUS | 1 refills | Status: DC

## 2023-01-05 NOTE — Addendum Note (Signed)
Addended by: Renard Hamper on: 01/05/2023 06:25 PM   Modules accepted: Orders

## 2023-01-05 NOTE — Telephone Encounter (Signed)
Unsure why her insurance does not cover Novolog, as the formulary I am able to find online has it on preferred list. Perhaps I am looking at formulary for wrong insurance plan.   Will send in Mitchell instead. It too is a rapid acting insulin with similar profile to Novolog. Script sent with same sig.   [name], MD

## 2023-01-06 ENCOUNTER — Encounter: Payer: Self-pay | Admitting: Family Medicine

## 2023-01-06 ENCOUNTER — Ambulatory Visit (INDEPENDENT_AMBULATORY_CARE_PROVIDER_SITE_OTHER): Payer: No Typology Code available for payment source | Admitting: Family Medicine

## 2023-01-06 VITALS — BP 138/74 | HR 67 | Wt 201.4 lb

## 2023-01-06 DIAGNOSIS — E1143 Type 2 diabetes mellitus with diabetic autonomic (poly)neuropathy: Secondary | ICD-10-CM

## 2023-01-06 DIAGNOSIS — I152 Hypertension secondary to endocrine disorders: Secondary | ICD-10-CM

## 2023-01-06 DIAGNOSIS — E1159 Type 2 diabetes mellitus with other circulatory complications: Secondary | ICD-10-CM

## 2023-01-06 DIAGNOSIS — Z23 Encounter for immunization: Secondary | ICD-10-CM

## 2023-01-06 DIAGNOSIS — Z794 Long term (current) use of insulin: Secondary | ICD-10-CM | POA: Diagnosis not present

## 2023-01-06 DIAGNOSIS — R21 Rash and other nonspecific skin eruption: Secondary | ICD-10-CM

## 2023-01-06 LAB — POCT GLYCOSYLATED HEMOGLOBIN (HGB A1C): HbA1c, POC (controlled diabetic range): 7.7 % — AB (ref 0.0–7.0)

## 2023-01-06 NOTE — Patient Instructions (Signed)
It was wonderful to see you today. Thank you for allowing me to be a part of your care. Below is a short summary of what we discussed at your visit today:  A1c Your A1c has improved! 7.7 today. You are at goal. No changes.   Skin  I'm so glad your shins have improved!   Swelling Take an extra torsemide today and tomorrow to help with the swelling in your legs.     Please bring all of your medications to every appointment!  If you have any questions or concerns, please do not hesitate to contact us via phone or MyChart message.   [name], MD

## 2023-01-07 ENCOUNTER — Other Ambulatory Visit: Payer: Self-pay | Admitting: Family Medicine

## 2023-01-07 DIAGNOSIS — M5416 Radiculopathy, lumbar region: Secondary | ICD-10-CM

## 2023-01-09 DIAGNOSIS — Z0000 Encounter for general adult medical examination without abnormal findings: Secondary | ICD-10-CM | POA: Insufficient documentation

## 2023-01-09 DIAGNOSIS — Z23 Encounter for immunization: Secondary | ICD-10-CM | POA: Insufficient documentation

## 2023-01-09 NOTE — Assessment & Plan Note (Signed)
Dx by punch biopsy. Improvement with triamcinolone, recommended to continue. No change to regimen at this time.

## 2023-01-09 NOTE — Assessment & Plan Note (Signed)
Tolerated COVID booster quite well, no adverse side effects.

## 2023-01-09 NOTE — Assessment & Plan Note (Signed)
A1c at goal, improved from last check without changes to regimen. Given A1c <8, can space out checks to every 6 months or so. Next check [place] or July.

## 2023-01-09 NOTE — Assessment & Plan Note (Signed)
BP at goal. No changes to regimen at this time. Tolerating meds well without adverse side effects.

## 2023-01-09 NOTE — Progress Notes (Signed)
SUBJECTIVE:   CHIEF COMPLAINT / HPI:   T2DM follow up Current regimen includes Lantus 64 units daily, fiasp mealtime insulin. Lovastatin 10 mg for HLD protection. Reports not taking Ozempic or Jardiance due to difficulty obtaining. No recent changes to diabetes regimen.   Lab Results  Component Value Date   HGBA1C 7.7 (A) 01/06/2023   HGBA1C 8.2 (A) 09/30/2022   HGBA1C 7.9 (A) 07/06/2022   Lab Results  Component Value Date   LDLCALC 78 08/04/2022   CREATININE 1.14 (H) 12/03/2022   Skin rash follow up Punch biopsy late December demonstrating actinic keratosis with superimposed spongiotic dermatitis (contact or nummular). Was recommended to use triamcinolone. Patient reports today that her legs have improved dramatically and she is quite pleased with the results.   HTN Home medications include valsartan 320 mg, torsemide 10 mg, spironolactone 25 mg, and amlodipine 10 mg daily +2.5 mg as needed (for days when systolics >086). Patient historically quite resistant to medcation change (for all conditions, such as diabetes). Dr. Jinny Sanders on 12/16/22 was able to discuss with patient and use shared decision making to DC PRN amlodipine, which I fully support. BP controlled today. Tolerating meds well without adverse side effects.  BP Readings from Last 3 Encounters:  01/06/23 138/74  12/23/22 128/60  12/16/22 (!) 151/48    PERTINENT  PMH / PSH:  Patient Active Problem List   Diagnosis Date Noted   Mood disorder ([place]) 09/03/2022    Priority: High   BMI 39.0-39.9,adult 09/02/2022    Priority: High   Morbid obesity ([place]) 06/24/2022    Priority: High   Cerebral atrophy, mild ([place]) 06/24/2022    Priority: High   CKD (chronic kidney disease) stage 3, GFR 30-59 ml/min (H[facility]) 01/13/2022    Priority: High   Diabetic autonomic neuropathy ([place]) 01/24/2019    Priority: High   Hypertension associated with diabetes ([place]) 10/17/2018    Priority: High   Diabetes mellitus, type II ([place])  02/16/2007    Priority: High   Hyperlipidemia associated with type 2 diabetes mellitus ([place]) 02/16/2007    Priority: High   Mild cognitive impairment 09/03/2022    Priority: Medium    Hypothyroidism 05/05/2022    Priority: Medium    Formed visual hallucinations 04/15/2022    Priority: Medium    Obstructive sleep apnea 12/04/2019    Priority: Medium    HFpEF 70-75% 07/15/2017    Priority: Medium    Mild aortic stenosis 06/19/2017    Priority: Medium    Moderate pulmonary valve insufficiency 06/19/2017    Priority: Medium    Allergic rhinitis 03/22/2011    Priority: Medium    Gastroparesis 08/12/2009    Priority: Medium    GASTROESOPHAGEAL REFLUX, NO ESOPHAGITIS 02/16/2007    Priority: Medium    Osteopenia after menopause 02/16/2007    Priority: Medium    Gout 02/16/2007    Priority: Medium    Cervical spinal stenosis 05/11/2022    Priority: Low   Esophageal dysphagia 03/27/2021    Priority: Low   Bilateral leg numbness 12/31/2020    Priority: Low   Laryngopharyngeal reflux (LPR) 06/19/2020    Priority: Low   Frequent falls 04/17/2020    Priority: Low   Restless leg syndrome 01/23/2020    Priority: Low   Chronically dry eyes, right 12/15/2018    Priority: Low   Lumbar back pain with radiculopathy affecting right lower extremity 10/29/2016    Priority: Low   INSOMNIA 09/16/2009    Priority: Low  Anxiety 10/18/2008    Priority: Low   Osteoarthritis of both knees 02/16/2007    Priority: Low   Encounter for immunization 01/09/2023   Skin rash 12/07/2022   Cellulitis of left lower extremity 12/03/2022   Need for immunization against influenza 09/11/2022   Abnormal MRI, spinal cord 01/03/2021   Constipation 07/20/2017    OBJECTIVE:   BP 138/74   Pulse 67   Wt 201 lb 6.4 oz (91.4 kg)   LMP  (LMP Unknown)   SpO2 93%   BMI 39.33 kg/m    PHQ-9:     01/06/2023   11:49 AM 12/23/2022   11:27 AM 12/16/2022    1:28 PM  Depression screen PHQ 2/9  Decreased  Interest 0 0 0  Down, Depressed, Hopeless 0 0 0  PHQ - 2 Score 0 0 0  Altered sleeping '2 2 1  '$ Tired, decreased energy 0 0 0  Change in appetite 0 0 0  Feeling bad or failure about yourself  0 0 0  Trouble concentrating 0 0 0  Moving slowly or fidgety/restless 0 0 0  Suicidal thoughts 0 0 0  PHQ-9 Score '2 2 1  '$ Difficult doing work/chores Not difficult at all Not difficult at all     Physical Exam General: Awake, alert, oriented Cardiovascular: Regular rate and rhythm, S1 and S2 present, no murmurs auscultated Respiratory: Lung fields clear to auscultation bilaterally Extremities: Trace BL edema, skin overlying calves and shins appears improved from prior images  ASSESSMENT/PLAN:   Encounter for immunization Tolerated COVID booster quite well, no adverse side effects.   Skin rash Dx by punch biopsy. Improvement with triamcinolone, recommended to continue. No change to regimen at this time.   Hypertension associated with diabetes ([place]) BP at goal. No changes to regimen at this time. Tolerating meds well without adverse side effects.   Diabetes mellitus, type II ([place]) A1c at goal, improved from last check without changes to regimen. Given A1c <8, can space out checks to every 6 months or so. Next check [place] or July.      [name], MD [place]

## 2023-01-10 ENCOUNTER — Telehealth: Payer: Self-pay | Admitting: Pharmacist

## 2023-01-10 NOTE — Telephone Encounter (Signed)
Returned call to patient.    She reports moderate-severe numbness in her finger and toes which worsens with exposure to cold (going outside over the weekend).   She denies using any therapies to try and gain relief    When asked, she reports previous exposure of Gabapentin however has NOT taken it recently.  (She reports a supply of old gabapentin at home).  I advised her NOT to take any until evaluated.   I encouraged her to schedule with Dr. Jeani Hawking to discuss her numbness (neuropathic) symptoms.   She agreed to schedule an appointment in the near future.   She thanked me for the follow-up.  No additional follow-up from me, planned at this time.

## 2023-01-13 DIAGNOSIS — L578 Other skin changes due to chronic exposure to nonionizing radiation: Secondary | ICD-10-CM | POA: Diagnosis not present

## 2023-01-13 DIAGNOSIS — Z08 Encounter for follow-up examination after completed treatment for malignant neoplasm: Secondary | ICD-10-CM | POA: Diagnosis not present

## 2023-01-13 DIAGNOSIS — Z85828 Personal history of other malignant neoplasm of skin: Secondary | ICD-10-CM | POA: Diagnosis not present

## 2023-01-18 ENCOUNTER — Ambulatory Visit: Payer: No Typology Code available for payment source | Admitting: Podiatry

## 2023-01-18 ENCOUNTER — Encounter: Payer: Self-pay | Admitting: Podiatry

## 2023-01-18 VITALS — BP 125/54

## 2023-01-18 DIAGNOSIS — M79674 Pain in right toe(s): Secondary | ICD-10-CM

## 2023-01-18 DIAGNOSIS — Z794 Long term (current) use of insulin: Secondary | ICD-10-CM | POA: Diagnosis not present

## 2023-01-18 DIAGNOSIS — E1143 Type 2 diabetes mellitus with diabetic autonomic (poly)neuropathy: Secondary | ICD-10-CM | POA: Diagnosis not present

## 2023-01-18 DIAGNOSIS — M79675 Pain in left toe(s): Secondary | ICD-10-CM

## 2023-01-18 DIAGNOSIS — B351 Tinea unguium: Secondary | ICD-10-CM

## 2023-01-18 NOTE — Progress Notes (Signed)
  Subjective:  Patient ID: Rebecca Walter, female    DOB: 03-15-1941,  MRN: [id number]  Chena C Slape presents to clinic today for at risk foot care with history of diabetic neuropathy and painful elongated mycotic toenails 1-5 bilaterally which are tender when wearing enclosed shoe gear. Pain is relieved with periodic professional debridement.  Chief Complaint  Patient presents with   Nail Problem    [place] BS-171 A1C-7.2 PCP-Catherine Jeani Hawking PCP VST-12/2022   New problem(s): None.   PCP is [name], MD.  Allergies  Allergen Reactions   Acyclovir     Other reaction(s): diarrhea   Baclofen Other (See Comments)    Caused leg weakness   Hydrochlorothiazide Other (See Comments)    Uric acid elevation and gout.      Liraglutide Other (See Comments)    Tongue Glossitus   Losartan Potassium     Other reaction(s): diarrhea   Lotensin [Benazepril] Other (See Comments)    Dry cough    Benazepril Hcl     Other reaction(s): dry cough   Metoprolol Succinate [Metoprolol]     Other reaction(s): coughing   Beta Adrenergic Blockers Other (See Comments)    Occurred with metoprolol  coughing   Other Diarrhea and Other (See Comments)    Occurred with metoprolol  REACTION: dry coughing   Review of Systems: Negative except as noted in the HPI.  Objective: No changes noted in today's physical examination. Vitals:   01/18/23 1410  BP: (!) 125/54   Rebecca Walter is a pleasant 82 y.o. female obese in NAD. AAO x 3.  Neurovascular Examination: Capillary refill time to digits immediate b/l. Palpable pedal pulses b/l LE. Pedal hair sparse. No pain with calf compression b/l. Lower extremity skin temperature gradient within normal limits. No edema noted b/l LE. No cyanosis or clubbing noted b/l LE.  Protective sensation intact 5/5 intact bilaterally with 10g monofilament b/l. Vibratory sensation intact b/l.  Dermatological:  Pedal integument with normal turgor, texture and tone BLE. No  open wounds b/l LE. No interdigital macerations noted b/l LE.   Toenails 1-5 b/l elongated, discolored, dystrophic, thickened, crumbly with subungual debris and tenderness to dorsal palpation. Incurvated nailplate right great toe medial border(s) with tenderness to palpation. No erythema, no edema, no drainage noted.   Musculoskeletal:  Normal muscle strength 5/5 to all lower extremity muscle groups bilaterally. Hammertoe(s) noted to the bilateral 2nd toes. No pain, crepitus or joint limitation noted with ROM b/l LE. Utilizes rollator for ambulation assistance.   Assessment/Plan: 1. Pain due to onychomycosis of toenails of both feet   2. Type 2 diabetes mellitus with diabetic autonomic neuropathy, with long-term current use of insulin ([place])     -Patient was evaluated and treated. All patient's and/or POA's questions/concerns answered on today's visit. -Continue foot and shoe inspections daily. Monitor blood glucose per PCP/Endocrinologist's recommendations. -Patient to continue soft, supportive shoe gear daily. -Toenails 1-5 b/l were debrided in length and girth with sterile nail nippers and dremel without iatrogenic bleeding.  -Patient/POA to call should there be question/concern in the interim.   Return in about 3 months (around 04/19/2023).  Marzetta Board, DPM

## 2023-01-29 ENCOUNTER — Other Ambulatory Visit: Payer: Self-pay | Admitting: Family Medicine

## 2023-01-31 ENCOUNTER — Other Ambulatory Visit: Payer: Self-pay | Admitting: Family Medicine

## 2023-01-31 DIAGNOSIS — K3184 Gastroparesis: Secondary | ICD-10-CM

## 2023-02-10 ENCOUNTER — Ambulatory Visit (INDEPENDENT_AMBULATORY_CARE_PROVIDER_SITE_OTHER): Payer: Medicare HMO | Admitting: Student

## 2023-02-10 ENCOUNTER — Other Ambulatory Visit: Payer: Self-pay

## 2023-02-10 ENCOUNTER — Ambulatory Visit
Admission: RE | Admit: 2023-02-10 | Discharge: 2023-02-10 | Disposition: A | Discharge status: Home / Self Care | Payer: Medicare HMO | Source: Ambulatory Visit | Attending: Family Medicine | Admitting: Family Medicine

## 2023-02-10 VITALS — BP 162/52 | HR 78 | Temp 98.4°F | Resp 30 | Wt 209.4 lb

## 2023-02-10 DIAGNOSIS — R0602 Shortness of breath: Secondary | ICD-10-CM | POA: Diagnosis not present

## 2023-02-10 DIAGNOSIS — J9601 Acute respiratory failure with hypoxia: Secondary | ICD-10-CM | POA: Diagnosis present

## 2023-02-10 DIAGNOSIS — R059 Cough, unspecified: Secondary | ICD-10-CM | POA: Diagnosis not present

## 2023-02-10 LAB — POC SOFIA 2 FLU + SARS ANTIGEN FIA
Influenza A, POC: NEGATIVE
Influenza B, POC: NEGATIVE
SARS Coronavirus 2 Ag: NEGATIVE

## 2023-02-10 MED ORDER — AZITHROMYCIN 250 MG PO TABS: ORAL_TABLET | ORAL | 0 refills | Status: DC

## 2023-02-10 MED ORDER — CEFDINIR 300 MG PO CAPS: 300.0000 mg | ORAL_CAPSULE | Freq: Two times a day (BID) | ORAL | 0 refills | Status: DC

## 2023-02-10 NOTE — Patient Instructions (Addendum)
If you develop chest pain or worsening shortness of breath, go to the ED.   I want you to take a DOUBLE DOSE of your torsemide today and then restart your normal dose tomorrow.   I have ordered an X-Ray. I want you to go to [facility] on the first floor of [facility], [place] They are open during business hours M-F. You can walk in and have your X-Ray done since I have already ordered it.    I'm sending in two antibiotics for you: the first is Azithromycin, I want you to take two tablets today and then one tablet daily for the next four days. The other medication is Cefdinir. You will take this twice daily for the next five days.  Marnee Guarneri, MD

## 2023-02-10 NOTE — Progress Notes (Addendum)
     SUBJECTIVE:   CHIEF COMPLAINT / HPI:   Dyspnea Present for a few days now. Also thinks she's had some fever/chills. Producing yellow sputum x2 days. Denies CP at rest but is having pain in the left side of her chest with deep coughing. Also note history of HFpEF on torsemide. Didn't take torsemide today. Hasn't noticed any more swelling than usual. Sleeping on 3 pillows, which is her baseline.     OBJECTIVE:   BP (!) 162/52   Pulse 78   Temp 98.4 F (36.9 C) (Oral)   Resp (!) 30   Wt 209 lb 6.4 oz (95 kg)   LMP  (LMP Unknown)   SpO2 93%   BMI 40.90 kg/m   Physical Exam Vitals reviewed.  Constitutional:      General: She is not in acute distress. Neck:     Comments: +JVD, neck supple and without LAD Cardiovascular:     Rate and Rhythm: Normal rate and regular rhythm.     Heart sounds: No murmur heard. Pulmonary:     Comments: Comfortably tachypneic. ++Crackles in LLL, no wheeze. Diminished in R base Abdominal:     General: Abdomen is flat. There is no distension.     Palpations: Abdomen is soft.     Tenderness: There is no abdominal tenderness.  Musculoskeletal:     Comments: 2+ pitting edema to the bilateral knees   Skin:    General: Skin is warm and dry.     Capillary Refill: Capillary refill takes less than 2 seconds.      ASSESSMENT/PLAN:   Shortness of breath PNA vs CHF. Flu and COVID negative in clinic. Hard to differentiate given elements of both in her presentation. Seems a bit more likely to be pneumonia given laterality of her exam and fever + sputum production. However, she is definitely volume up on my exam today and needs diuresing. Weight is up 8 lbs from her last visit ~6 weeks ago. However, did not take her torsemide today. Mildly tachypneic but comfortable, I believe she is stable for outpatient management at this time, but strictly reviewed ED precautions with her for worsening symptoms or new chest pain.  - Instructed to take a double dose of  torsemide when she gets home and, if helpful, another double dose tomorrow - Will obtain CXR - Treating empirically for CAP with Cefdinir + Azithromycin x5 days - Strict ED precautions reviewed  - I will call the patient tomorrow for close follow-up      J Pearla Dubonnet, MD [place]

## 2023-02-10 NOTE — Assessment & Plan Note (Signed)
>>  ASSESSMENT AND PLAN FOR SHORTNESS OF BREATH WRITTEN ON 02/10/2023  4:35 PM BY [place], Dorene Grebe, MD  PNA vs CHF. Flu and COVID negative in clinic. Hard to differentiate given elements of both in her presentation. Seems a bit more likely to be pneumonia given laterality of her exam and fever + sputum production. However, she is definitely volume up on my exam today and needs diuresing. Weight is up 8 lbs from her last visit ~6 weeks ago. However, did not take her torsemide today. Mildly tachypneic but comfortable, I believe she is stable for outpatient management at this time, but strictly reviewed ED precautions with her for worsening symptoms or new chest pain.  - Instructed to take a double dose of torsemide when she gets home and, if helpful, another double dose tomorrow - Will obtain CXR - Treating empirically for CAP with Cefdinir + Azithromycin x5 days - Strict ED precautions reviewed  - I will call the patient tomorrow for close follow-up

## 2023-02-10 NOTE — Assessment & Plan Note (Addendum)
PNA vs CHF. Flu and COVID negative in clinic. Hard to differentiate given elements of both in her presentation. Seems a bit more likely to be pneumonia given laterality of her exam and fever + sputum production. However, she is definitely volume up on my exam today and needs diuresing. Weight is up 8 lbs from her last visit ~6 weeks ago. However, did not take her torsemide today. Mildly tachypneic but comfortable, I believe she is stable for outpatient management at this time, but strictly reviewed ED precautions with her for worsening symptoms or new chest pain.  - Instructed to take a double dose of torsemide when she gets home and, if helpful, another double dose tomorrow - Will obtain CXR - Treating empirically for CAP with Cefdinir + Azithromycin x5 days - Strict ED precautions reviewed  - I will call the patient tomorrow for close follow-up

## 2023-02-11 ENCOUNTER — Emergency Department (HOSPITAL_COMMUNITY): Payer: Medicare HMO

## 2023-02-11 ENCOUNTER — Inpatient Hospital Stay (HOSPITAL_COMMUNITY)
Admission: EM | Admit: 2023-02-11 | Discharge: 2023-02-22 | DRG: 177 | Disposition: A | Discharge status: Skilled Nursing Facility | Payer: Medicare HMO | Attending: Family Medicine | Admitting: Family Medicine

## 2023-02-11 ENCOUNTER — Encounter (HOSPITAL_COMMUNITY): Payer: Self-pay

## 2023-02-11 ENCOUNTER — Other Ambulatory Visit (HOSPITAL_COMMUNITY): Payer: Self-pay

## 2023-02-11 ENCOUNTER — Observation Stay (HOSPITAL_COMMUNITY): Payer: Medicare HMO

## 2023-02-11 ENCOUNTER — Other Ambulatory Visit: Payer: Self-pay

## 2023-02-11 DIAGNOSIS — G4733 Obstructive sleep apnea (adult) (pediatric): Secondary | ICD-10-CM | POA: Diagnosis not present

## 2023-02-11 DIAGNOSIS — I110 Hypertensive heart disease with heart failure: Secondary | ICD-10-CM | POA: Diagnosis present

## 2023-02-11 DIAGNOSIS — Z87448 Personal history of other diseases of urinary system: Secondary | ICD-10-CM

## 2023-02-11 DIAGNOSIS — E1140 Type 2 diabetes mellitus with diabetic neuropathy, unspecified: Secondary | ICD-10-CM | POA: Diagnosis present

## 2023-02-11 DIAGNOSIS — M8580 Other specified disorders of bone density and structure, unspecified site: Secondary | ICD-10-CM | POA: Diagnosis present

## 2023-02-11 DIAGNOSIS — Z85820 Personal history of malignant melanoma of skin: Secondary | ICD-10-CM

## 2023-02-11 DIAGNOSIS — J189 Pneumonia, unspecified organism: Secondary | ICD-10-CM

## 2023-02-11 DIAGNOSIS — R7881 Bacteremia: Secondary | ICD-10-CM

## 2023-02-11 DIAGNOSIS — R197 Diarrhea, unspecified: Secondary | ICD-10-CM | POA: Diagnosis present

## 2023-02-11 DIAGNOSIS — J811 Chronic pulmonary edema: Secondary | ICD-10-CM | POA: Diagnosis not present

## 2023-02-11 DIAGNOSIS — J1289 Other viral pneumonia: Secondary | ICD-10-CM | POA: Diagnosis not present

## 2023-02-11 DIAGNOSIS — J1561 Pneumonia due to Acinetobacter baumannii: Principal | ICD-10-CM | POA: Diagnosis present

## 2023-02-11 DIAGNOSIS — Z79890 Hormone replacement therapy (postmenopausal): Secondary | ICD-10-CM

## 2023-02-11 DIAGNOSIS — J188 Other pneumonia, unspecified organism: Secondary | ICD-10-CM

## 2023-02-11 DIAGNOSIS — Z8249 Family history of ischemic heart disease and other diseases of the circulatory system: Secondary | ICD-10-CM

## 2023-02-11 DIAGNOSIS — M109 Gout, unspecified: Secondary | ICD-10-CM | POA: Diagnosis present

## 2023-02-11 DIAGNOSIS — E1165 Type 2 diabetes mellitus with hyperglycemia: Secondary | ICD-10-CM | POA: Diagnosis not present

## 2023-02-11 DIAGNOSIS — K219 Gastro-esophageal reflux disease without esophagitis: Secondary | ICD-10-CM | POA: Diagnosis not present

## 2023-02-11 DIAGNOSIS — Z888 Allergy status to other drugs, medicaments and biological substances status: Secondary | ICD-10-CM

## 2023-02-11 DIAGNOSIS — I10 Essential (primary) hypertension: Secondary | ICD-10-CM | POA: Diagnosis not present

## 2023-02-11 DIAGNOSIS — R0602 Shortness of breath: Secondary | ICD-10-CM | POA: Diagnosis present

## 2023-02-11 DIAGNOSIS — Z823 Family history of stroke: Secondary | ICD-10-CM

## 2023-02-11 DIAGNOSIS — Z66 Do not resuscitate: Secondary | ICD-10-CM | POA: Diagnosis not present

## 2023-02-11 DIAGNOSIS — B9729 Other coronavirus as the cause of diseases classified elsewhere: Secondary | ICD-10-CM | POA: Diagnosis not present

## 2023-02-11 DIAGNOSIS — E039 Hypothyroidism, unspecified: Secondary | ICD-10-CM | POA: Diagnosis present

## 2023-02-11 DIAGNOSIS — E119 Type 2 diabetes mellitus without complications: Secondary | ICD-10-CM

## 2023-02-11 DIAGNOSIS — Z833 Family history of diabetes mellitus: Secondary | ICD-10-CM

## 2023-02-11 DIAGNOSIS — Z7984 Long term (current) use of oral hypoglycemic drugs: Secondary | ICD-10-CM

## 2023-02-11 DIAGNOSIS — I371 Nonrheumatic pulmonary valve insufficiency: Secondary | ICD-10-CM | POA: Diagnosis not present

## 2023-02-11 DIAGNOSIS — R0609 Other forms of dyspnea: Secondary | ICD-10-CM | POA: Diagnosis not present

## 2023-02-11 DIAGNOSIS — B342 Coronavirus infection, unspecified: Secondary | ICD-10-CM

## 2023-02-11 DIAGNOSIS — R059 Cough, unspecified: Secondary | ICD-10-CM | POA: Diagnosis not present

## 2023-02-11 DIAGNOSIS — E785 Hyperlipidemia, unspecified: Secondary | ICD-10-CM | POA: Diagnosis not present

## 2023-02-11 DIAGNOSIS — Z7982 Long term (current) use of aspirin: Secondary | ICD-10-CM

## 2023-02-11 DIAGNOSIS — T380X5A Adverse effect of glucocorticoids and synthetic analogues, initial encounter: Secondary | ICD-10-CM | POA: Diagnosis not present

## 2023-02-11 DIAGNOSIS — I5032 Chronic diastolic (congestive) heart failure: Secondary | ICD-10-CM | POA: Diagnosis present

## 2023-02-11 DIAGNOSIS — Z809 Family history of malignant neoplasm, unspecified: Secondary | ICD-10-CM

## 2023-02-11 DIAGNOSIS — J90 Pleural effusion, not elsewhere classified: Secondary | ICD-10-CM | POA: Diagnosis not present

## 2023-02-11 DIAGNOSIS — Z794 Long term (current) use of insulin: Secondary | ICD-10-CM

## 2023-02-11 DIAGNOSIS — I5033 Acute on chronic diastolic (congestive) heart failure: Secondary | ICD-10-CM | POA: Diagnosis present

## 2023-02-11 DIAGNOSIS — Z1152 Encounter for screening for COVID-19: Secondary | ICD-10-CM | POA: Diagnosis not present

## 2023-02-11 DIAGNOSIS — R0902 Hypoxemia: Secondary | ICD-10-CM | POA: Diagnosis not present

## 2023-02-11 DIAGNOSIS — Z85828 Personal history of other malignant neoplasm of skin: Secondary | ICD-10-CM

## 2023-02-11 DIAGNOSIS — J9601 Acute respiratory failure with hypoxia: Secondary | ICD-10-CM | POA: Diagnosis present

## 2023-02-11 DIAGNOSIS — A4154 Sepsis due to Acinetobacter baumannii: Secondary | ICD-10-CM

## 2023-02-11 DIAGNOSIS — E1143 Type 2 diabetes mellitus with diabetic autonomic (poly)neuropathy: Secondary | ICD-10-CM | POA: Diagnosis not present

## 2023-02-11 DIAGNOSIS — Z743 Need for continuous supervision: Secondary | ICD-10-CM | POA: Diagnosis not present

## 2023-02-11 DIAGNOSIS — R1310 Dysphagia, unspecified: Secondary | ICD-10-CM | POA: Diagnosis present

## 2023-02-11 DIAGNOSIS — Z79899 Other long term (current) drug therapy: Secondary | ICD-10-CM

## 2023-02-11 DIAGNOSIS — F419 Anxiety disorder, unspecified: Secondary | ICD-10-CM | POA: Diagnosis not present

## 2023-02-11 HISTORY — DX: Pneumonia, unspecified organism: J18.9

## 2023-02-11 HISTORY — DX: Shortness of breath: R06.02

## 2023-02-11 LAB — I-STAT VENOUS BLOOD GAS, ED
Acid-base deficit: 4 mmol/L — ABNORMAL HIGH (ref 0.0–2.0)
Bicarbonate: 21.8 mmol/L (ref 20.0–28.0)
Calcium, Ion: 1.19 mmol/L (ref 1.15–1.40)
HCT: 34 % — ABNORMAL LOW (ref 36.0–46.0)
Hemoglobin: 11.6 g/dL — ABNORMAL LOW (ref 12.0–15.0)
O2 Saturation: 97 %
Potassium: 3.3 mmol/L — ABNORMAL LOW (ref 3.5–5.1)
Sodium: 139 mmol/L (ref 135–145)
TCO2: 23 mmol/L (ref 22–32)
pCO2, Ven: 42.3 mmHg — ABNORMAL LOW (ref 44–60)
pH, Ven: 7.321 (ref 7.25–7.43)
pO2, Ven: 104 mmHg — ABNORMAL HIGH (ref 32–45)

## 2023-02-11 LAB — ECHOCARDIOGRAM COMPLETE
AR max vel: 3.23 cm2
AV Area VTI: 3.07 cm2
AV Area mean vel: 3.16 cm2
AV Mean grad: 7 mmHg
AV Peak grad: 13.5 mmHg
Ao pk vel: 1.84 m/s
Area-P 1/2: 5.42 cm2
Height: 60 in
S' Lateral: 3.4 cm
Weight: 3347.46 oz

## 2023-02-11 LAB — COMPREHENSIVE METABOLIC PANEL
ALT: 21 U/L (ref 0–44)
AST: 27 U/L (ref 15–41)
Albumin: 3.6 g/dL (ref 3.5–5.0)
Alkaline Phosphatase: 62 U/L (ref 38–126)
Anion gap: 11 (ref 5–15)
BUN: 16 mg/dL (ref 8–23)
CO2: 19 mmol/L — ABNORMAL LOW (ref 22–32)
Calcium: 8.6 mg/dL — ABNORMAL LOW (ref 8.9–10.3)
Chloride: 105 mmol/L (ref 98–111)
Creatinine, Ser: 0.87 mg/dL (ref 0.44–1.00)
GFR, Estimated: >60 mL/min (ref >60)
Glucose, Bld: 210 mg/dL — ABNORMAL HIGH (ref 70–99)
Potassium: 3.5 mmol/L (ref 3.5–5.1)
Sodium: 135 mmol/L (ref 135–145)
Total Bilirubin: 0.8 mg/dL (ref 0.3–1.2)
Total Protein: 7.1 g/dL (ref 6.5–8.1)

## 2023-02-11 LAB — CBC WITH DIFFERENTIAL/PLATELET
Abs Immature Granulocytes: 0.08 10*3/uL — ABNORMAL HIGH (ref 0.00–0.07)
Basophils Absolute: 0 10*3/uL (ref 0.0–0.1)
Basophils Relative: 0 %
Eosinophils Absolute: 0.2 10*3/uL (ref 0.0–0.5)
Eosinophils Relative: 2 %
HCT: 34.8 % — ABNORMAL LOW (ref 36.0–46.0)
Hemoglobin: 11.9 g/dL — ABNORMAL LOW (ref 12.0–15.0)
Immature Granulocytes: 1 %
Lymphocytes Relative: 10 %
Lymphs Abs: 1.1 10*3/uL (ref 0.7–4.0)
MCH: 31.4 pg (ref 26.0–34.0)
MCHC: 34.2 g/dL (ref 30.0–36.0)
MCV: 91.8 fL (ref 80.0–100.0)
Monocytes Absolute: 1.1 10*3/uL — ABNORMAL HIGH (ref 0.1–1.0)
Monocytes Relative: 10 %
Neutro Abs: 8.3 10*3/uL — ABNORMAL HIGH (ref 1.7–7.7)
Neutrophils Relative %: 77 %
Platelets: 188 10*3/uL (ref 150–400)
RBC: 3.79 MIL/uL — ABNORMAL LOW (ref 3.87–5.11)
RDW: 13.4 % (ref 11.5–15.5)
WBC: 10.9 10*3/uL — ABNORMAL HIGH (ref 4.0–10.5)
nRBC: 0 % (ref 0.0–0.2)

## 2023-02-11 LAB — LACTIC ACID, PLASMA
Lactic Acid, Venous: 1 mmol/L (ref 0.5–1.9)
Lactic Acid, Venous: 0.8 mmol/L (ref 0.5–1.9)

## 2023-02-11 LAB — BLOOD CULTURE ID PANEL (REFLEXED) - BCID2

## 2023-02-11 LAB — GLUCOSE, CAPILLARY
Glucose-Capillary: 224 mg/dL — ABNORMAL HIGH (ref 70–99)
Glucose-Capillary: 222 mg/dL — ABNORMAL HIGH (ref 70–99)
Glucose-Capillary: 166 mg/dL — ABNORMAL HIGH (ref 70–99)

## 2023-02-11 LAB — TROPONIN I (HIGH SENSITIVITY)
Troponin I (High Sensitivity): 15 ng/L (ref <18)
Troponin I (High Sensitivity): 19 ng/L — ABNORMAL HIGH (ref <18)

## 2023-02-11 LAB — BRAIN NATRIURETIC PEPTIDE: B Natriuretic Peptide: 277.7 pg/mL — ABNORMAL HIGH (ref 0.0–100.0)

## 2023-02-11 LAB — RESP PANEL BY RT-PCR (RSV, FLU A&B, COVID)  RVPGX2
Influenza A by PCR: NEGATIVE
Influenza B by PCR: NEGATIVE
Resp Syncytial Virus by PCR: NEGATIVE
SARS Coronavirus 2 by RT PCR: NEGATIVE

## 2023-02-11 LAB — CBG MONITORING, ED: Glucose-Capillary: 221 mg/dL — ABNORMAL HIGH (ref 70–99)

## 2023-02-11 LAB — PROTIME-INR
INR: 1.1 (ref 0.8–1.2)
Prothrombin Time: 14.1 seconds (ref 11.4–15.2)

## 2023-02-11 LAB — APTT: aPTT: 35 seconds (ref 24–36)

## 2023-02-11 MED ORDER — BUSPIRONE HCL 5 MG PO TABS
15.0000 mg | ORAL_TABLET | Freq: Two times a day (BID) | ORAL | Status: DC
  Administered 2023-02-11 – 2023-02-22 (×23): 15 mg via ORAL
  Filled 2023-02-11: qty 3
  Filled 2023-02-11: qty 2
  Filled 2023-02-11 (×21): qty 3

## 2023-02-11 MED ORDER — SERTRALINE HCL 100 MG PO TABS
100.0000 mg | ORAL_TABLET | Freq: Every day | ORAL | Status: DC
  Administered 2023-02-11 – 2023-02-22 (×12): 100 mg via ORAL
  Filled 2023-02-11 (×11): qty 1

## 2023-02-11 MED ORDER — SODIUM CHLORIDE 0.9 % IV SOLN
3.0000 g | Freq: Four times a day (QID) | INTRAVENOUS | Status: DC
  Administered 2023-02-12 – 2023-02-22 (×38): 3 g via INTRAVENOUS
  Filled 2023-02-11 (×37): qty 8

## 2023-02-11 MED ORDER — IRBESARTAN 150 MG PO TABS
300.0000 mg | ORAL_TABLET | Freq: Every day | ORAL | Status: DC
  Administered 2023-02-11 – 2023-02-22 (×12): 300 mg via ORAL
  Filled 2023-02-11: qty 1
  Filled 2023-02-11 (×11): qty 2

## 2023-02-11 MED ORDER — FAMOTIDINE 20 MG PO TABS: 20.0000 mg | ORAL_TABLET | Freq: Two times a day (BID) | ORAL | Status: DC

## 2023-02-11 MED ORDER — PRAVASTATIN SODIUM 40 MG PO TABS
40.0000 mg | ORAL_TABLET | Freq: Every day | ORAL | Status: DC
  Administered 2023-02-11 – 2023-02-21 (×11): 40 mg via ORAL
  Filled 2023-02-11 (×13): qty 1

## 2023-02-11 MED ORDER — ORAL CARE MOUTH RINSE: 15.0000 mL | OROMUCOSAL | Status: DC | PRN

## 2023-02-11 MED ORDER — LEVOTHYROXINE SODIUM 50 MCG PO TABS
50.0000 ug | ORAL_TABLET | Freq: Every day | ORAL | Status: DC
  Administered 2023-02-11 – 2023-02-22 (×12): 50 ug via ORAL
  Filled 2023-02-11 (×9): qty 1
  Filled 2023-02-11: qty 2
  Filled 2023-02-11 (×2): qty 1

## 2023-02-11 MED ORDER — AMLODIPINE BESYLATE 10 MG PO TABS
10.0000 mg | ORAL_TABLET | Freq: Every day | ORAL | Status: DC
  Administered 2023-02-11 – 2023-02-22 (×12): 10 mg via ORAL
  Filled 2023-02-11 (×7): qty 1
  Filled 2023-02-11: qty 2
  Filled 2023-02-11 (×4): qty 1

## 2023-02-11 MED ORDER — PANTOPRAZOLE SODIUM 40 MG PO TBEC
40.0000 mg | DELAYED_RELEASE_TABLET | Freq: Two times a day (BID) | ORAL | Status: DC
  Administered 2023-02-11 – 2023-02-22 (×22): 40 mg via ORAL
  Filled 2023-02-11 (×23): qty 1

## 2023-02-11 MED ORDER — INSULIN ASPART 100 UNIT/ML IJ SOLN
0.0000 [IU] | Freq: Three times a day (TID) | INTRAMUSCULAR | Status: DC
  Administered 2023-02-11: 3 [IU] via SUBCUTANEOUS
  Administered 2023-02-12 (×2): 2 [IU] via SUBCUTANEOUS
  Administered 2023-02-12: 3 [IU] via SUBCUTANEOUS
  Administered 2023-02-13: 2 [IU] via SUBCUTANEOUS
  Administered 2023-02-13: 5 [IU] via SUBCUTANEOUS
  Administered 2023-02-13: 3 [IU] via SUBCUTANEOUS
  Administered 2023-02-14 (×3): 2 [IU] via SUBCUTANEOUS
  Administered 2023-02-15: 3 [IU] via SUBCUTANEOUS

## 2023-02-11 MED ORDER — SERTRALINE HCL 50 MG PO TABS
50.0000 mg | ORAL_TABLET | ORAL | Status: DC
  Administered 2023-02-11 – 2023-02-22 (×23): 50 mg via ORAL
  Filled 2023-02-11 (×25): qty 1

## 2023-02-11 MED ORDER — ENOXAPARIN SODIUM 40 MG/0.4ML IJ SOSY
40.0000 mg | PREFILLED_SYRINGE | INTRAMUSCULAR | Status: DC
  Administered 2023-02-11 – 2023-02-22 (×12): 40 mg via SUBCUTANEOUS
  Filled 2023-02-11 (×12): qty 0.4

## 2023-02-11 MED ORDER — INSULIN GLARGINE-YFGN 100 UNIT/ML ~~LOC~~ SOLN
30.0000 [IU] | Freq: Every day | SUBCUTANEOUS | Status: DC
  Administered 2023-02-11 – 2023-02-21 (×11): 30 [IU] via SUBCUTANEOUS
  Filled 2023-02-11 (×12): qty 0.3

## 2023-02-11 MED ORDER — SERTRALINE HCL 100 MG PO TABS: 100.0000 mg | ORAL_TABLET | Freq: Every day | ORAL | Status: DC

## 2023-02-11 MED ORDER — ORAL CARE MOUTH RINSE
15.0000 mL | OROMUCOSAL | Status: DC
  Administered 2023-02-11 – 2023-02-16 (×17): 15 mL via OROMUCOSAL

## 2023-02-11 MED ORDER — SODIUM CHLORIDE 0.9 % IV SOLN
1.0000 g | Freq: Once | INTRAVENOUS | Status: AC
  Administered 2023-02-11: 1 g via INTRAVENOUS
  Filled 2023-02-11: qty 10

## 2023-02-11 MED ORDER — SPIRONOLACTONE 25 MG PO TABS
25.0000 mg | ORAL_TABLET | Freq: Every day | ORAL | Status: DC
  Administered 2023-02-11 – 2023-02-22 (×12): 25 mg via ORAL
  Filled 2023-02-11 (×7): qty 1
  Filled 2023-02-11: qty 2
  Filled 2023-02-11 (×4): qty 1

## 2023-02-11 MED ORDER — FUROSEMIDE 10 MG/ML IJ SOLN
40.0000 mg | Freq: Every day | INTRAMUSCULAR | Status: DC
  Administered 2023-02-11 – 2023-02-12 (×2): 40 mg via INTRAVENOUS
  Filled 2023-02-11 (×2): qty 4

## 2023-02-11 MED ORDER — METOCLOPRAMIDE HCL 5 MG PO TABS
5.0000 mg | ORAL_TABLET | Freq: Three times a day (TID) | ORAL | Status: DC
  Administered 2023-02-11 – 2023-02-22 (×33): 5 mg via ORAL
  Filled 2023-02-11 (×35): qty 1

## 2023-02-11 MED ORDER — ALLOPURINOL 100 MG PO TABS
100.0000 mg | ORAL_TABLET | Freq: Every day | ORAL | Status: DC
  Administered 2023-02-12 – 2023-02-22 (×11): 100 mg via ORAL
  Filled 2023-02-11 (×11): qty 1

## 2023-02-11 MED ORDER — SODIUM CHLORIDE 0.9 % IV SOLN
2.0000 g | INTRAVENOUS | Status: DC
  Filled 2023-02-11: qty 20

## 2023-02-11 MED ORDER — ASPIRIN 81 MG PO CHEW
81.0000 mg | CHEWABLE_TABLET | Freq: Every day | ORAL | Status: DC
  Administered 2023-02-11: 81 mg via ORAL
  Filled 2023-02-11: qty 1

## 2023-02-11 MED ORDER — FUROSEMIDE 10 MG/ML IJ SOLN
20.0000 mg | Freq: Once | INTRAMUSCULAR | Status: AC
  Administered 2023-02-11: 20 mg via INTRAVENOUS
  Filled 2023-02-11: qty 2

## 2023-02-11 MED ORDER — SODIUM CHLORIDE 0.9 % IV SOLN
500.0000 mg | INTRAVENOUS | Status: DC
  Administered 2023-02-11: 500 mg via INTRAVENOUS
  Filled 2023-02-11: qty 5

## 2023-02-11 NOTE — Progress Notes (Signed)
FMTS Interim Progress Note  S: Evaluated patient at bedside with Dr. Madison Hickman.  Patient reports no improvement in respiratory symptoms.  Patient has obstructive sleep apnea, noncompliant with CPAP at home.  She is agreeable to CPAP while sleeping tonight.  Additionally patient is agreeable to consider spirometry.  O: BP 126/72   Pulse 81   Temp 98.9 F (37.2 C) (Oral)   Resp 20   Ht 5' (1.524 m)   Wt 94.9 kg   LMP  (LMP Unknown)   SpO2 91%   BMI 40.86 kg/m    General: NAD, chronically ill-appearing, awake Cardio: RRR no murmurs Respiratory: Bilateral coarse breath sounds, transmitted upper airway sounds.  Normal work of breathing on 10 L high flow.  Speaking complete sentences. Extremities: + 1-2 pitting edema bilateral lower extremities  A/P: AHRF  -S/p 60 mg IV Lasix, may need increased dose (no improvement in lower extremity edema) -CPAP at night -Incentive spirometry -Continue current management as outlined by day team  Leslie Dales, DO 02/11/2023, 8:26 PM PGY-1, [place] Medicine Service pager [phone]

## 2023-02-11 NOTE — Progress Notes (Signed)
Pt taken off BiPAP and placed on 3L [place] by RT. Pt tolerating well at this time, RN aware, vitals stable, no increased WOB noted, SpO2 94%, BiPAP on standby at bedside,RT will monitor.

## 2023-02-11 NOTE — Progress Notes (Signed)
PHARMACY - PHYSICIAN COMMUNICATION CRITICAL VALUE ALERT - BLOOD CULTURE IDENTIFICATION (BCID)  Rebecca Walter is an 82 y.o. female who presented to [facility] on 02/11/2023 with a chief complaint of shortness of breath  Assessment:  1/4 acinetobacter baumanii   Name of physician (or Provider) Contacted: [facility]  Current antibiotics: azithromycin/ceftriaxone  Changes to prescribed antibiotics recommended:  D/c current abx, switch to unasyn (ampicillin/sulbactam)  Results for orders placed or performed during the hospital encounter of 02/11/23  Blood Culture ID Panel (Reflexed) (Collected: 02/11/2023  4:20 AM)  Result Value Ref Range   Enterococcus faecalis NOT DETECTED NOT DETECTED   Enterococcus Faecium NOT DETECTED NOT DETECTED   Listeria monocytogenes NOT DETECTED NOT DETECTED   Staphylococcus species NOT DETECTED NOT DETECTED   Staphylococcus aureus (BCID) NOT DETECTED NOT DETECTED   Staphylococcus epidermidis NOT DETECTED NOT DETECTED   Staphylococcus lugdunensis NOT DETECTED NOT DETECTED   Streptococcus species NOT DETECTED NOT DETECTED   Streptococcus agalactiae NOT DETECTED NOT DETECTED   Streptococcus pneumoniae NOT DETECTED NOT DETECTED   Streptococcus pyogenes NOT DETECTED NOT DETECTED   A.calcoaceticus-baumannii DETECTED (A) NOT DETECTED   Bacteroides fragilis NOT DETECTED NOT DETECTED   Enterobacterales NOT DETECTED NOT DETECTED   Enterobacter cloacae complex NOT DETECTED NOT DETECTED   Escherichia coli NOT DETECTED NOT DETECTED   Klebsiella aerogenes NOT DETECTED NOT DETECTED   Klebsiella oxytoca NOT DETECTED NOT DETECTED   Klebsiella pneumoniae NOT DETECTED NOT DETECTED   Proteus species NOT DETECTED NOT DETECTED   Salmonella species NOT DETECTED NOT DETECTED   Serratia marcescens NOT DETECTED NOT DETECTED   Haemophilus influenzae NOT DETECTED NOT DETECTED   Neisseria meningitidis NOT DETECTED NOT DETECTED   Pseudomonas aeruginosa NOT DETECTED NOT DETECTED    Stenotrophomonas maltophilia NOT DETECTED NOT DETECTED   Candida albicans NOT DETECTED NOT DETECTED   Candida auris NOT DETECTED NOT DETECTED   Candida glabrata NOT DETECTED NOT DETECTED   Candida krusei NOT DETECTED NOT DETECTED   Candida parapsilosis NOT DETECTED NOT DETECTED   Candida tropicalis NOT DETECTED NOT DETECTED   Cryptococcus neoformans/gattii NOT DETECTED NOT DETECTED   CTX-M ESBL NOT DETECTED NOT DETECTED   Carbapenem resistance IMP NOT DETECTED NOT DETECTED   Carbapenem resistance KPC NOT DETECTED NOT DETECTED   Carbapenem resistance NDM NOT DETECTED NOT DETECTED   Carbapenem resistance VIM NOT DETECTED NOT DETECTED    Wilson Singer, PharmD Clinical Pharmacist 02/11/2023 11:01 PM

## 2023-02-11 NOTE — Assessment & Plan Note (Addendum)
Weight stable. Euvolemic on exam. Respiratory status improving. - Cont Lasix to '40mg'$  PO daily  - Strict I/Os - Daily weights - Daily BMP, Mg

## 2023-02-11 NOTE — TOC Benefit Eligibility Note (Signed)
Patient Advocate Encounter  Insurance verification completed.    The patient is currently admitted and upon discharge could be taking Farxiga 10 mg.  The current 30 day co-pay is $47.00.   The patient is currently admitted and upon discharge could be taking Jardiance 10 mg.  The current 30 day co-pay is $47.00.   The patient is insured through [employer] Medicare Part D   Abron Neddo, CPHT Pharmacy Patient Advocate Specialist [facility] Patient Advocate Team Direct Number: [phone]  Fax: [phone]       

## 2023-02-11 NOTE — Assessment & Plan Note (Addendum)
Stable with some elevations, required 13 units SAI 3/2 -Semglee 30units daily  -sSSI -CBG monitoring qAC and qHS

## 2023-02-11 NOTE — ED Triage Notes (Signed)
Pt arrived via ems from home c/o sob was dx with pnu at pcp earlier in day and placed on azithromycin and cefdmir. Pt reports taking a dose of both prior to goi ng to bed. Pt was 76% on ra at home put on NRB at 15l sats improve to 98%

## 2023-02-11 NOTE — Assessment & Plan Note (Addendum)
Subjectively patient reports feeling much better. Suspect she will need to d/c with some supplemental O2.  - Continue Unasyn (2/24-3/5) and minocycline (2/27-3/5) - BiPAP as needed, wean oxygen as tolerated, maintain SPO2 >88% - Incentive spirometry and flutter valve - Out of bed as able - Continue PT - TOC following for placement  - Evaluate for home oxygen requirement

## 2023-02-11 NOTE — ED Notes (Signed)
ED TO INPATIENT HANDOFF REPORT  ED Nurse Name and Phone #: [phone]  S Name/Age/Gender Rebecca Walter 81 y.o. female Room/Bed: 039C/039C  Code Status   Code Status: Full Code  Home/SNF/Other Home Patient oriented to: self, place, time, and situation Is this baseline? Yes   Triage Complete: Triage complete  Chief Complaint Shortness of breath [R06.02] Acute hypoxemic respiratory failure ([place]) [J96.01]  Triage Note Pt arrived via ems from home c/o sob was dx with pnu at pcp earlier in day and placed on azithromycin and cefdmir. Pt reports taking a dose of both prior to goi ng to bed. Pt was 76% on ra at home put on NRB at 15l sats improve to 98%   Allergies Allergies  Allergen Reactions   Acyclovir     Other reaction(s): diarrhea   Baclofen Other (See Comments)    Caused leg weakness   Hydrochlorothiazide Other (See Comments)    Uric acid elevation and gout.      Liraglutide Other (See Comments)    Tongue Glossitus   Losartan Potassium     Other reaction(s): diarrhea   Lotensin [Benazepril] Other (See Comments)    Dry cough    Benazepril Hcl     Other reaction(s): dry cough   Metoprolol Succinate [Metoprolol]     Other reaction(s): coughing   Beta Adrenergic Blockers Other (See Comments)    Occurred with metoprolol  coughing   Other Diarrhea and Other (See Comments)    Occurred with metoprolol  REACTION: dry coughing    Level of Care/Admitting Diagnosis ED Disposition     ED Disposition  Admit   Condition  --   [place]: [place] [[id number]]  Level of Care: Progressive [102]  Admit to Progressive based on following criteria: RESPIRATORY PROBLEMS hypoxemic/hypercapnic respiratory failure that is responsive to NIPPV (BiPAP) or High Flow Nasal Cannula (6-80 lpm). Frequent assessment/intervention, no > Q2 hrs < Q4 hrs, to maintain oxygenation and pulmonary hygiene.  May place patient in observation at [facility] or [place] if  equivalent level of care is available:: No  Covid Evaluation: Confirmed COVID Negative  Diagnosis: Acute hypoxemic respiratory failure [facility]) [id number]  Admitting Physician: NEAL, [place]  Attending Physician: Dickie La [[id number]]          B Medical/Surgery History Past Medical History:  Diagnosis Date   Acute pain of both knees 04/04/2022   Anxiety    Arthritis    Basal cell carcinoma 03/02/2021   Candidiasis of skin 02/14/2020   CARCINOMA, BASAL CELL 05/26/2007   Qualifier: History of  By: Barbra Sarks MD, Rachel     Carpal tunnel syndrome of right wrist 11/26/2020   Cervical spinal stenosis 05/11/2022   MRI Brain (05/11/22) shows moderate cervical spinal stenosis   Chronic diastolic CHF (congestive heart failure) ([place])    Echocardiogram 01/2020: EF 70-75, no RWMA, Gr 1 DD, normal RVSF, RVSP 45.8, mild to mod LAE, trivial MR, mild PI   Diabetes mellitus    Diarrhea of presumed infectious origin 01/16/2021   DYSKINESIA, ESOPHAGUS 09/13/2007   Qualifier: Diagnosis of  By: Brigitte Pulse MD, Kimberlee     Early satiety 09/04/2020   Elevated TSH 11/21/2017   Epistaxis 02/04/2020   Generalized weakness 12/15/2018   GERD (gastroesophageal reflux disease)    Gout    History of melanoma 01/31/2021   History of nuclear stress test    Myoview 06/2020: EF 75, no ischemia or infarction, low risk  Hoarseness 11/27/2019   Hyperlipidemia    Hypertension    Hypertensive heart disease with CHF (congestive heart failure) (HCC)    Hyponatremia    Leg fatigue 01/03/2021   Mild aortic stenosis 06/2017   Mild cognitive impairment 09/03/2022   Mild mitral regurgitation 06/2017   Moderate pulmonary valve insufficiency 06/2017   OBESITY, NOS 02/16/2007   Qualifier: Diagnosis of  By: Drucie Ip     Osteopenia    Vertigo 08/29/2013   Past Surgical History:  Procedure Laterality Date   KIDNEY SURGERY     KNEE SURGERY     MENISECTOMY     OTHER SURGICAL HISTORY Left    Epiretinal Peel Dr  Gershon Crane (Ophth)   WRIST SURGERY       A IV Location/Drains/Wounds Patient Lines/Drains/Airways Status     Active Line/Drains/Airways     Name Placement date Placement time Site Days   Peripheral IV 02/11/23 20 G Posterior;Right Hand 02/11/23  0429  Hand  less than 1   Peripheral IV 02/11/23 20 G Left;Posterior Hand 02/11/23  0440  Hand  less than 1   External Urinary Catheter 02/11/23  0500  --  less than 1            Intake/Output Last 24 hours  Intake/Output Summary (Last 24 hours) at 02/11/2023 1309 Last data filed at 02/11/2023 1242 Gross per 24 hour  Intake 100 ml  Output 700 ml  Net -600 ml    Labs/Imaging Results for orders placed or performed during the hospital encounter of 02/11/23 (from the past 48 hour(s))  Resp panel by RT-PCR (RSV, Flu A&B, Covid) Anterior Nasal Swab     Status: None   Collection Time: 02/11/23  4:15 AM   Specimen: Anterior Nasal Swab  Result Value Ref Range   SARS Coronavirus 2 by RT PCR NEGATIVE NEGATIVE   Influenza A by PCR NEGATIVE NEGATIVE   Influenza B by PCR NEGATIVE NEGATIVE    Comment: (NOTE) The Xpert Xpress SARS-CoV-2/FLU/RSV plus assay is intended as an aid in the diagnosis of influenza from Nasopharyngeal swab specimens and should not be used as a sole basis for treatment. Nasal washings and aspirates are unacceptable for Xpert Xpress SARS-CoV-2/FLU/RSV testing.  Fact Sheet for Patients: EntrepreneurPulse.com.au  Fact Sheet for Healthcare Providers: IncredibleEmployment.be  This test is not yet approved or cleared by the [place] FDA and has been authorized for detection and/or diagnosis of SARS-CoV-2 by FDA under an Emergency Use Authorization (EUA). This EUA will remain in effect (meaning this test can be used) for the duration of the COVID-19 declaration under Section 564(b)(1) of the Act, 21 U.S.C. section 360bbb-3(b)(1), unless the authorization is terminated  or revoked.     Resp Syncytial Virus by PCR NEGATIVE NEGATIVE    Comment: (NOTE) Fact Sheet for Patients: EntrepreneurPulse.com.au  Fact Sheet for Healthcare Providers: IncredibleEmployment.be  This test is not yet approved or cleared by the [place] FDA and has been authorized for detection and/or diagnosis of SARS-CoV-2 by FDA under an Emergency Use Authorization (EUA). This EUA will remain in effect (meaning this test can be used) for the duration of the COVID-19 declaration under Section 564(b)(1) of the Act, 21 U.S.C. section 360bbb-3(b)(1), unless the authorization is terminated or revoked.  Performed at [facility], [place] [address] [place], [place] [address]   Lactic acid, plasma     Status: None   Collection Time: 02/11/23  4:20 AM  Result Value Ref Range   Lactic  Acid, Venous 1.0 0.5 - 1.9 mmol/L    Comment: Performed at [facility] [address] [place], [place] [address]  Comprehensive metabolic panel     Status: Abnormal   Collection Time: 02/11/23  4:20 AM  Result Value Ref Range   Sodium 135 135 - 145 mmol/L   Potassium 3.5 3.5 - 5.1 mmol/L   Chloride 105 98 - 111 mmol/L   CO2 19 (L) 22 - 32 mmol/L   Glucose, Bld 210 (H) 70 - 99 mg/dL    Comment: Glucose reference range applies only to samples taken after fasting for at least 8 hours.   BUN 16 8 - 23 mg/dL   Creatinine, Ser 0.87 0.44 - 1.00 mg/dL   Calcium 8.6 (L) 8.9 - 10.3 mg/dL   Total Protein 7.1 6.5 - 8.1 g/dL   Albumin 3.6 3.5 - 5.0 g/dL   AST 27 15 - 41 U/L   ALT 21 0 - 44 U/L   Alkaline Phosphatase 62 38 - 126 U/L   Total Bilirubin 0.8 0.3 - 1.2 mg/dL   GFR, Estimated >60 >60 mL/min    Comment: (NOTE) Calculated using the CKD-EPI Creatinine Equation (2021)    Anion gap 11 5 - 15    Comment: Performed at [facility] [address] [place], [place] [address]  CBC with Differential     Status: Abnormal   Collection Time: 02/11/23   4:20 AM  Result Value Ref Range   WBC 10.9 (H) 4.0 - 10.5 K/uL   RBC 3.79 (L) 3.87 - 5.11 MIL/uL   Hemoglobin 11.9 (L) 12.0 - 15.0 g/dL   HCT 34.8 (L) 36.0 - 46.0 %   MCV 91.8 80.0 - 100.0 fL   MCH 31.4 26.0 - 34.0 pg   MCHC 34.2 30.0 - 36.0 g/dL   RDW 13.4 11.5 - 15.5 %   Platelets 188 150 - 400 K/uL   nRBC 0.0 0.0 - 0.2 %   Neutrophils Relative % 77 %   Neutro Abs 8.3 (H) 1.7 - 7.7 K/uL   Lymphocytes Relative 10 %   Lymphs Abs 1.1 0.7 - 4.0 K/uL   Monocytes Relative 10 %   Monocytes Absolute 1.1 (H) 0.1 - 1.0 K/uL   Eosinophils Relative 2 %   Eosinophils Absolute 0.2 0.0 - 0.5 K/uL   Basophils Relative 0 %   Basophils Absolute 0.0 0.0 - 0.1 K/uL   Immature Granulocytes 1 %   Abs Immature Granulocytes 0.08 (H) 0.00 - 0.07 K/uL    Comment: Performed at [facility] [address] [place], [place] [address]  Protime-INR     Status: None   Collection Time: 02/11/23  4:20 AM  Result Value Ref Range   Prothrombin Time 14.1 11.4 - 15.2 seconds   INR 1.1 0.8 - 1.2    Comment: (NOTE) INR goal varies based on device and disease states. Performed at [facility], [place] [address], [place], [place] [address]   APTT     Status: None   Collection Time: 02/11/23  4:20 AM  Result Value Ref Range   aPTT 35 24 - 36 seconds    Comment: Performed at [facility] [address], [place],  [address]  Blood Culture (routine x 2)     Status: None (Preliminary result)   Collection Time: 02/11/23  4:20 AM   Specimen: BLOOD RIGHT HAND  Result Value Ref Range   Specimen Description BLOOD  RIGHT HAND    Special Requests      BOTTLES DRAWN AEROBIC AND ANAEROBIC Blood Culture adequate volume   Culture      NO GROWTH < 12 HOURS Performed at [facility], [place] [address] [place], [place] [address]    Report Status PENDING   Brain natriuretic peptide     Status: Abnormal   Collection Time: 02/11/23  4:20 AM  Result Value Ref Range   B Natriuretic Peptide 277.7  (H) 0.0 - 100.0 pg/mL    Comment: Performed at [facility] [address] [place], [place] [address]  Troponin I (High Sensitivity)     Status: None   Collection Time: 02/11/23  4:20 AM  Result Value Ref Range   Troponin I (High Sensitivity) 15 <18 ng/L    Comment: (NOTE) Elevated high sensitivity troponin I (hsTnI) values and significant  changes across serial measurements may suggest ACS but many other  chronic and acute conditions are known to elevate hsTnI results.  Refer to the "Links" section for chest pain algorithms and additional  guidance. Performed at [facility], [place] [address], [place], [place] [address]   Blood Culture (routine x 2)     Status: None (Preliminary result)   Collection Time: 02/11/23  4:25 AM   Specimen: BLOOD LEFT HAND  Result Value Ref Range   Specimen Description BLOOD LEFT HAND    Special Requests      BOTTLES DRAWN AEROBIC AND ANAEROBIC Blood Culture adequate volume   Culture      NO GROWTH < 12 HOURS Performed at [facility], [place] [address], [place], [place] [address]    Report Status PENDING   I-Stat venous blood gas, ED ([facility])     Status: Abnormal   Collection Time: 02/11/23  4:40 AM  Result Value Ref Range   pH, Ven 7.321 7.25 - 7.43   pCO2, Ven 42.3 (L) 44 - 60 mmHg   pO2, Ven 104 (H) 32 - 45 mmHg   Bicarbonate 21.8 20.0 - 28.0 mmol/L   TCO2 23 22 - 32 mmol/L   O2 Saturation 97 %   Acid-base deficit 4.0 (H) 0.0 - 2.0 mmol/L   Sodium 139 135 - 145 mmol/L   Potassium 3.3 (L) 3.5 - 5.1 mmol/L   Calcium, Ion 1.19 1.15 - 1.40 mmol/L   HCT 34.0 (L) 36.0 - 46.0 %   Hemoglobin 11.6 (L) 12.0 - 15.0 g/dL   Sample type VENOUS   Lactic acid, plasma     Status: None   Collection Time: 02/11/23  6:27 AM  Result Value Ref Range   Lactic Acid, Venous 0.8 0.5 - 1.9 mmol/L    Comment: Performed at [facility] [address] [place], [place] [address]  Troponin I (High Sensitivity)     Status: Abnormal   Collection  Time: 02/11/23  6:27 AM  Result Value Ref Range   Troponin I (High Sensitivity) 19 (H) <18 ng/L    Comment: (NOTE) Elevated high sensitivity troponin I (hsTnI) values and significant  changes across serial measurements may suggest ACS but many other  chronic and acute conditions are known to elevate hsTnI results.  Refer to the "Links" section for chest pain algorithms and additional  guidance. Performed at [facility], [place] [address] [place], [place] [address]   CBG monitoring, ED     Status: Abnormal   Collection Time: 02/11/23 12:50 PM  Result Value  Ref Range   Glucose-Capillary 221 (H) 70 - 99 mg/dL    Comment: Glucose reference range applies only to samples taken after fasting for at least 8 hours.   *Note: Due to a large number of results and/or encounters for the requested time period, some results have not been displayed. A complete set of results can be found in Results Review.   DG Chest Port 1 View  Result Date: 02/11/2023 CLINICAL DATA:  82 year old female with possible sepsis. EXAM: PORTABLE CHEST 1 VIEW COMPARISON:  Chest radiographs yesterday and earlier. FINDINGS: Portable AP semi upright view at 0440 hours. Stable lung volumes and mediastinal contours but increasing bibasilar pulmonary opacity, now confluent on the right. The right hemidiaphragm remains visible. No pneumothorax. Upper lung vascularity appears stable and normal arguing against pulmonary edema. No air bronchograms at this time. Paucity of bowel gas in the visible abdomen. Stable visualized osseous structures. IMPRESSION: Increasing bilateral lung base opacity, suspicious for Bilateral Pneumonia in this clinical setting. Aspiration and pleural effusions are also differential considerations. No evidence of pulmonary edema. Electronically Signed   By: Genevie Ann M.D.   On: 02/11/2023 05:26   DG Chest 2 View  Result Date: 02/10/2023 CLINICAL DATA:  Shortness of breath and cough EXAM: CHEST - 2 VIEW  COMPARISON:  Chest x-ray Caelyn 29, 2021. FINDINGS: Patchy airspace opacities at the right greater than left lung bases. Possible trace pleural effusions. No visible pneumothorax. Cardiomediastinal silhouette is mildly enlarged. Polyarticular degenerative change. IMPRESSION: 1. Patchy airspace opacities at the right greater than left lung bases, suspicious for pneumonia. Followup PA and lateral chest X-ray is recommended in 3-4 weeks following trial of antibiotic therapy to ensure resolution and exclude underlying malignancy. 2. Possible trace pleural effusions. These results will be called to the ordering clinician or representative by the Radiologist Assistant, and communication documented in the PACS or [employer]. Electronically Signed   By: Margaretha [place] M.D.   On: 02/10/2023 16:27    Pending Labs Unresulted Labs (From admission, onward)     Start     Ordered   02/18/23 0500  Creatinine, serum  (enoxaparin (LOVENOX)    CrCl >/= 30 ml/min)  Weekly,   R     Comments: while on enoxaparin therapy    02/11/23 0808   02/12/23 [id number]  Basic metabolic panel  Tomorrow morning,   R        02/11/23 0808   02/12/23 0500  Magnesium  Tomorrow morning,   R        02/11/23 0808   02/12/23 0500  CBC  Tomorrow morning,   R        02/11/23 0808            Vitals/Pain Today's Vitals   02/11/23 0730 02/11/23 0837 02/11/23 1045 02/11/23 1238  BP: (!) 150/87  (!) 159/148   Pulse: 75  85   Resp: 20  17   Temp:  98.2 F (36.8 C)  98.8 F (37.1 C)  TempSrc:  Oral  Oral  SpO2: 97%  91%   Weight:      Height:      PainSc:        Isolation Precautions No active isolations  Medications Medications  allopurinol (ZYLOPRIM) tablet 100 mg (has no administration in time range)  aspirin chewable tablet 81 mg (81 mg Oral Given 02/11/23 0952)  amLODipine (NORVASC) tablet 10 mg (10 mg Oral Given 02/11/23 0953)  pravastatin (PRAVACHOL) tablet 40 mg (has no administration  in time range)   spironolactone (ALDACTONE) tablet 25 mg (25 mg Oral Given 02/11/23 0952)  irbesartan (AVAPRO) tablet 300 mg (300 mg Oral Given 02/11/23 0952)  busPIRone (BUSPAR) tablet 15 mg (15 mg Oral Given 02/11/23 0954)  sertraline (ZOLOFT) tablet 100 mg (100 mg Oral Given 02/11/23 0953)  levothyroxine (SYNTHROID) tablet 50 mcg (50 mcg Oral Given 02/11/23 0953)  metoCLOPramide (REGLAN) tablet 5 mg (has no administration in time range)  pantoprazole (PROTONIX) EC tablet 40 mg (40 mg Oral Given 02/11/23 0953)  enoxaparin (LOVENOX) injection 40 mg (40 mg Subcutaneous Given 02/11/23 0952)  insulin glargine-yfgn (SEMGLEE) injection 30 Units (has no administration in time range)  insulin aspart (novoLOG) injection 0-9 Units (has no administration in time range)  cefTRIAXone (ROCEPHIN) 2 g in sodium chloride 0.9 % 100 mL IVPB (has no administration in time range)  azithromycin (ZITHROMAX) 500 mg in sodium chloride 0.9 % 250 mL IVPB (has no administration in time range)  furosemide (LASIX) injection 40 mg (has no administration in time range)  cefTRIAXone (ROCEPHIN) 1 g in sodium chloride 0.9 % 100 mL IVPB (0 g Intravenous Stopped 02/11/23 0503)  furosemide (LASIX) injection 20 mg (20 mg Intravenous Given 02/11/23 0606)    Mobility walks with device     Focused Assessments Cardiac Assessment Handoff:    Lab Results  Component Value Date   TROPONINI <0.03 03/26/2019   No results found for: "DDIMER" Does the Patient currently have chest pain? No   , Pulmonary Assessment Handoff:  Lung sounds: Bilateral Breath Sounds: Coarse crackles L Breath Sounds: Coarse crackles R Breath Sounds: Coarse crackles O2 Device: Nasal Cannula O2 Flow Rate (L/min): 3 L/min    R Recommendations: See Admitting Provider Note  Report given to:   Additional Notes: none

## 2023-02-11 NOTE — Progress Notes (Signed)
Pt placed on CPAP for bed. RT will cont to monitor as needed.

## 2023-02-11 NOTE — ED Provider Notes (Signed)
[facility] Provider Note   CSN: [id number] Arrival date & time: 02/11/23  0414     History  Chief Complaint  Patient presents with   Shortness of Breath    Rebecca Walter is a 82 y.o. female.  The history is provided by the patient, the EMS personnel and medical records.  Shortness of Breath Rebecca Walter is a 82 y.o. female who presents to the Emergency Department complaining of shortness of breath.  She presents to the emergency department by EMS for evaluation of shortness of breath.  She states that about 1 week ago she started feeling unwell with cough productive of clear sputum and difficulty breathing.  No reports of fevers, chest pain, hemoptysis, abdominal pain.  She has experienced a small amount of associated diarrhea as well.  No hematochezia or melena.  She went to her PCP yesterday and was started on antibiotics and was able to take the first dose.  She called EMS tonight due to significant worsening in her difficulty breathing.  EMS report sats in the mid 70s on their arrival.  She was placed on a nonrebreather with improvement in her sats to the mid 90s.   She has a history of diabetes, CHF, hypertension.  Home Medications Prior to Admission medications   Medication Sig Start Date End Date Taking? Authorizing Provider  acetaminophen (TYLENOL) 325 MG tablet Take 325 mg by mouth every 8 (eight) hours as needed (pain).     [provider]  allopurinol (ZYLOPRIM) 100 MG tablet Take 2 tablets (200 mg total) by mouth daily. Patient taking differently: Take 100 mg by mouth daily. 07/16/22   [name], MD  amLODipine (NORVASC) 10 MG tablet Take 1 tablet (10 mg total) by mouth daily. 11/22/22   [name], MD  aspirin 81 MG chewable tablet Chew 1 tablet (81 mg total) by mouth daily. 03/22/11   Cletus Gash, MD  azithromycin (ZITHROMAX) 250 MG tablet Take two tablets on the first day, followed by one tablet  daily for four days for a total of five days of treatment. 02/10/23   Eppie Gibson, MD  baclofen (LIORESAL) 10 MG tablet TAKE 0.5-1 TAB BY MOUTH 2 TIMES DAILY AS NEEDED FOR MUSCLE SPASMS. STOP USE IF DEVELOP LEG WEAKNESS. 01/07/23   [name], MD  busPIRone (BUSPAR) 15 MG tablet TAKE 1 TABLET BY MOUTH TWICE A DAY 10/18/22   [name], MD  cefdinir (OMNICEF) 300 MG capsule Take 1 capsule (300 mg total) by mouth 2 (two) times daily. 02/10/23   Eppie Gibson, MD  diclofenac Sodium (VOLTAREN) 1 % GEL Apply 2 g topically 3 (three) times daily. To help with knee pain. 03/31/22   [name], MD  empagliflozin (JARDIANCE) 10 MG TABS tablet Take 1 tablet (10 mg total) by mouth daily. Take EITHER this OR [place]. DO NOT take both. Patient not taking: Reported on 09/02/2022 04/13/22   [name], MD  famotidine (PEPCID) 20 MG tablet TAKE 1 TABLET BY MOUTH TWICE A DAY 04/08/22   [name], MD  hydrocortisone cream (PREPARATION H) 1 % Apply 1 Application topically 2 (two) times daily. 12/07/22   Lilland, Alana, DO  ibuprofen (ADVIL) 200 MG tablet Take 200 mg by mouth daily as needed for mild pain or moderate pain.    [provider]  insulin aspart (FIASP) 100 UNIT/ML FlexTouch Pen INJECT 15 UNITS SUBCUTANEOUSLY DAILY WITH BREAKFAST, INJECT 9 UNITS WITH LUNCH AS NEEDED FOR  CBG 180 OR MORE, AND INJECT 9 UNITS WITH SUPPER 01/05/23   [name], MD  insulin glargine (LANTUS SOLOSTAR) 100 UNIT/ML Solostar Pen Inject 64 Units into the skin daily. 03/30/22   [name], MD  levothyroxine (SYNTHROID) 50 MCG tablet Take 1 tablet (50 mcg total) by mouth every morning. 30 minutes before food 10/18/22   [name], MD  lidocaine (XYLOCAINE) 2 % solution TAKE 15 MLS IN THE THROAT TWICE A DAY AS NEEDED ,USE WITH MAALOX    [provider]  loperamide (IMODIUM A-D) 2 MG tablet 1 tablet as needed    [provider]  loratadine (CLARITIN) 10 MG tablet Take 10 mg  by mouth daily.    [provider]  lovastatin (MEVACOR) 40 MG tablet TAKE 1 TABLET BY MOUTH EVERYDAY AT BEDTIME 03/01/22   [name], MD  metoCLOPramide (REGLAN) 5 MG tablet 1 TABLET BEFORE MEALS 01/31/23   [name], MD  Multiple Vitamin (MULTI VITAMIN) TABS 1 tablet    [provider]  OneTouch Delica Lancets [id number] MISC Please use to check blood sugar up to 4 times daily. E11.9 08/15/22   [name], MD  pantoprazole (PROTONIX) 40 MG tablet TAKE 1 TABLET BY MOUTH TWICE A DAY 12/31/22   [name], MD  Polyethyl Glycol-Propyl Glycol (SYSTANE) 0.4-0.3 % GEL ophthalmic gel Place 1 application  into both eyes 3 (three) times daily.    [provider]  polyethylene glycol powder (GLYCOLAX/MIRALAX) 17 GM/SCOOP powder Take 17 g by mouth daily. 12/03/22   [name], MD  prednisoLONE acetate (PRED FORTE) 1 % ophthalmic suspension 1 drop 3 (three) times daily. 09/22/22   [provider]  Semaglutide,0.25 or 0.'5MG'$ /DOS, (OZEMPIC, 0.25 OR 0.5 MG/DOSE,) 2 MG/1.5ML SOPN Inject 0.25 mg into the skin once a week. If tolerating, will increase to 0.5 mg weekly after 4 weeks. Patient not taking: Reported on 09/02/2022 01/14/22   [name], MD  sertraline (ZOLOFT) 100 MG tablet Take 1 tablet (100 mg total) by mouth daily. 11/22/22   [name], MD  sertraline (ZOLOFT) 50 MG tablet TAKE 1 TABLET IN THE AFTERNOON, ONE DOSE AT BEDTIME 11/22/22   [name], MD  spironolactone (ALDACTONE) 25 MG tablet TAKE 1 TABLET BY MOUTH EVERY DAY 10/22/22   [name], MD  torsemide (DEMADEX) 10 MG tablet TAKE 2 TABLETS (20 MG TOTAL) BY MOUTH DAILY AS NEEDED (EDEMA, >3LB WEIGHT GAIN). 10/18/22   [name], MD  triamcinolone ointment (KENALOG) 0.5 % Apply 1 Application topically 2 (two) times daily. 12/07/22   Lilland, Alana, DO  valsartan (DIOVAN) 320 MG tablet TAKE 1 TABLET BY MOUTH EVERY DAY 10/18/22   [name], MD  Vitamin E 400 units TABS 1  tablet    [provider]      Allergies    Acyclovir, Baclofen, Hydrochlorothiazide, Liraglutide, Losartan potassium, Lotensin [benazepril], Benazepril hcl, Metoprolol succinate [metoprolol], Beta adrenergic blockers, and Other    Review of Systems   Review of Systems  Respiratory:  Positive for shortness of breath.   All other systems reviewed and are negative.   Physical Exam Updated Vital Signs BP (!) 159/63   Pulse 67   Temp 98.4 F (36.9 C) (Oral)   Resp (!) 21   Ht 5' (1.524 m)   Wt 94.9 kg   LMP  (LMP Unknown)   SpO2 96%   BMI 40.86 kg/m  Physical Exam Vitals and nursing note reviewed.  Constitutional:      General: She  is in acute distress.     Appearance: She is well-developed. She is ill-appearing.  HENT:     Head: Normocephalic and atraumatic.  Cardiovascular:     Rate and Rhythm: Normal rate and regular rhythm.     Heart sounds: No murmur heard. Pulmonary:     Effort: Pulmonary effort is normal. No respiratory distress.     Comments: Decreased air movement bilaterally with occasional expiratory wheezes.  Occasional crackles in the bases bilaterally Abdominal:     Palpations: Abdomen is soft.     Tenderness: There is no abdominal tenderness. There is no guarding or rebound.  Musculoskeletal:        General: No tenderness.     Comments: 3+ pitting edema to bilateral lower extremities.  2+ DP pulses bilaterally  Skin:    General: Skin is warm and dry.  Neurological:     Mental Status: She is alert and oriented to person, place, and time.  Psychiatric:        Behavior: Behavior normal.     ED Results / Procedures / Treatments   Labs (all labs ordered are listed, but only abnormal results are displayed) Labs Reviewed  COMPREHENSIVE METABOLIC PANEL - Abnormal; Notable for the following components:      Result Value   CO2 19 (*)    Glucose, Bld 210 (*)    Calcium 8.6 (*)    All other components within normal limits  CBC WITH  DIFFERENTIAL/PLATELET - Abnormal; Notable for the following components:   WBC 10.9 (*)    RBC 3.79 (*)    Hemoglobin 11.9 (*)    HCT 34.8 (*)    Neutro Abs 8.3 (*)    Monocytes Absolute 1.1 (*)    Abs Immature Granulocytes 0.08 (*)    All other components within normal limits  BRAIN NATRIURETIC PEPTIDE - Abnormal; Notable for the following components:   B Natriuretic Peptide 277.7 (*)    All other components within normal limits  I-STAT VENOUS BLOOD GAS, ED - Abnormal; Notable for the following components:   pCO2, Ven 42.3 (*)    pO2, Ven 104 (*)    Acid-base deficit 4.0 (*)    Potassium 3.3 (*)    HCT 34.0 (*)    Hemoglobin 11.6 (*)    All other components within normal limits  RESP PANEL BY RT-PCR (RSV, FLU A&B, COVID)  RVPGX2  CULTURE, BLOOD (ROUTINE X 2)  CULTURE, BLOOD (ROUTINE X 2)  LACTIC ACID, PLASMA  PROTIME-INR  APTT  LACTIC ACID, PLASMA  URINALYSIS, W/ REFLEX TO CULTURE (INFECTION SUSPECTED)  TROPONIN I (HIGH SENSITIVITY)  TROPONIN I (HIGH SENSITIVITY)    EKG EKG Interpretation  Date/Time:  Friday February 11 2023 04:20:47 EST Ventricular Rate:  79 PR Interval:  231 QRS Duration: 87 QT Interval:  341 QTC Calculation: 391 R Axis:   -44 Text Interpretation: Sinus rhythm Atrial premature complexes Prolonged PR interval Left axis deviation Nonspecific repol abnormality, lateral leads Confirmed by Quintella Reichert [phone]) on 02/11/2023 5:17:31 AM  Radiology DG Chest Port 1 View  Result Date: 02/11/2023 CLINICAL DATA:  82 year old female with possible sepsis. EXAM: PORTABLE CHEST 1 VIEW COMPARISON:  Chest radiographs yesterday and earlier. FINDINGS: Portable AP semi upright view at 0440 hours. Stable lung volumes and mediastinal contours but increasing bibasilar pulmonary opacity, now confluent on the right. The right hemidiaphragm remains visible. No pneumothorax. Upper lung vascularity appears stable and normal arguing against pulmonary edema. No air bronchograms  at this time. Paucity  of bowel gas in the visible abdomen. Stable visualized osseous structures. IMPRESSION: Increasing bilateral lung base opacity, suspicious for Bilateral Pneumonia in this clinical setting. Aspiration and pleural effusions are also differential considerations. No evidence of pulmonary edema. Electronically Signed   By: Genevie Ann M.D.   On: 02/11/2023 05:26   DG Chest 2 View  Result Date: 02/10/2023 CLINICAL DATA:  Shortness of breath and cough EXAM: CHEST - 2 VIEW COMPARISON:  Chest x-ray [place] 29, 2021. FINDINGS: Patchy airspace opacities at the right greater than left lung bases. Possible trace pleural effusions. No visible pneumothorax. Cardiomediastinal silhouette is mildly enlarged. Polyarticular degenerative change. IMPRESSION: 1. Patchy airspace opacities at the right greater than left lung bases, suspicious for pneumonia. Followup PA and lateral chest X-ray is recommended in 3-4 weeks following trial of antibiotic therapy to ensure resolution and exclude underlying malignancy. 2. Possible trace pleural effusions. These results will be called to the ordering clinician or representative by the Radiologist Assistant, and communication documented in the PACS or [employer]. Electronically Signed   By: Margaretha Sheffield M.D.   On: 02/10/2023 16:27    Procedures Procedures   CRITICAL CARE Performed by: Quintella Reichert   Total critical care time: 40 minutes  Critical care time was exclusive of separately billable procedures and treating other patients.  Critical care was necessary to treat or prevent imminent or life-threatening deterioration.  Critical care was time spent personally by me on the following activities: development of treatment plan with patient and/or surrogate as well as nursing, discussions with consultants, evaluation of patient's response to treatment, examination of patient, obtaining history from patient or surrogate, ordering and performing treatments  and interventions, ordering and review of laboratory studies, ordering and review of radiographic studies, pulse oximetry and re-evaluation of patient's condition.  Medications Ordered in ED Medications  cefTRIAXone (ROCEPHIN) 1 g in sodium chloride 0.9 % 100 mL IVPB (0 g Intravenous Stopped 02/11/23 0503)  furosemide (LASIX) injection 20 mg (20 mg Intravenous Given 02/11/23 0606)    ED Course/ Medical Decision Making/ A&P                             Medical Decision Making Amount and/or Complexity of Data Reviewed Labs: ordered. Radiology: ordered. ECG/medicine tests: ordered.  Risk Prescription drug management. Decision regarding hospitalization.   Patient with history of CHF here for evaluation of increased shortness of breath, cough for 1 week.  She had sats in the 70s for EMS was placed on nonrebreather.  Patient with increased work of breathing at time of ED presentation and was transition to BiPAP for respiratory support.  Chest x-ray demonstrates progressive bibasilar infiltrates-images personally reviewed and interpreted, agree with radiologist interpretation.  She does appear volume overloaded on examination, BNP is mildly elevated.  Will treat with one-time dose of Lasix to evaluate for diuresis.  She was unable to take her home torsemide yesterday.  She had already taken azithromycin prior to ED presentation.  Will add on Rocephin IV here.  On repeat evaluation on BiPAP patient is feeling significantly improved.  At this juncture feel that she is stable for admission to the hospital for ongoing care.  Medicine consulted for admission for ongoing care.        Final Clinical Impression(s) / ED Diagnoses Final diagnoses:  Multifocal pneumonia    Rx / DC Orders ED Discharge Orders     None  Quintella Reichert, MD 02/11/23 [phone]

## 2023-02-11 NOTE — Progress Notes (Signed)
RT placed patient on BiPAP per verbal order.

## 2023-02-11 NOTE — Progress Notes (Signed)
FMTS Interim Progress Note  S: Went to see patient after seeing increase in oxygenation status.  Discussed with nurse at bedside and patient had initially arrived to the unit off of BiPAP on nasal cannula and had been titrated up to 6 L but had a desaturation to the 70s.  She was then put on BiPAP again.  She was just now taken off of BiPAP and put on nasal cannula and was titrated up to 9 L and saturating at 90 to 91% when I was in the room.  O: BP (!) 159/66   Pulse 82   Temp 98 F (36.7 C) (Axillary)   Resp 20   Ht 5' (1.524 m)   Wt 94.9 kg   LMP  (LMP Unknown)   SpO2 91%   BMI 40.86 kg/m    Gen: Alert, responsive to questions Resp: Increased work of breathing on [place], chest rising symmetrically  A/P: AHRF CAP and HFpEF exacerbation -s/p 60 mg IV lasix today (output charted so far not a lot) -consider torsemide  -BiPAP may be required again tonight -change level of care to progressive  Gerrit Heck, MD 02/11/2023, 6:04 PM PGY-2, [facility] Service pager [phone]

## 2023-02-11 NOTE — Assessment & Plan Note (Addendum)
Likely secondary to CAP as well as subacute HFpEF exacerbation. Requiring BiPAP for increased work of breathing. - Admit to FMTS, med-tele, attending Dr. Nori Riis - s/p 1 dose CTX, continue (2/23-) - s/p 20 mg IV lasix, consider redose - Pending echocardiogram - Wean oxygen as tolerated - Strict I's and O's - Daily weights - AM CBC, BMP, Mag

## 2023-02-11 NOTE — H&P (Addendum)
Hospital Admission History and Physical Service Pager: [phone]  Patient name: Rebecca Walter Medical record number: [id number] Date of Birth: January 24, 1941 Age: 82 y.o. Gender: female  Primary Care Provider: [name], MD Consultants: None Code Status: Full Preferred Emergency Contact:  Contact Information     Name Relation Home Work [place] Son [phone]         Chief Complaint: Shortness of breath  Assessment and Plan: Rebecca Walter is an 82 y.o. female presenting with shortness of breath.   Leading differentials include: Pneumonia (CXR suggestive of pleural effusions, leukocytosis, slightly elevated ANC) vs. decompensated heart failure (BNP 277, bilateral pitting edema, fluid on CXR, grade 1 diastolic dysfunction with preserved EF on echo from 3 years ago).  COVID and flu negative.  Other differentials considered and less likely: Pulmonary embolism (not tachycardic, maintaining O2 with supplemental oxygen-low threshold to scan), ACS (first trop 15, pending second troponin, EKG without ischemia), anxiety, malignancy.  * Acute hypoxemic respiratory failure ([place]) Requiring BiPAP in the ED due to SpO2 mid 70s by EMS and increased work of breathing on arrival. Etiology likely secondary to CAP as well as subacute HFpEF exacerbation. Also wonder whether pulmonary hypertension is playing any role as this was noted on prior echo in 2021 and patient endorsing exertional dyspnea worsening over at least 1 month. -Admit to FMTS, progressive, attending Dr. Nori Riis -Continue Ceftriaxone (2/23- ) and Azithromycin (2/23- ) -Resp status much improved on my exam, will trial off BiPAP -Wean oxygen as tolerated, goal SpO2 >92% -s/p 20 mg IV lasix -plan to redose Lasix this afternoon pending UOP -Continue home spironolactone '25mg'$  daily -Strict I/Os -Daily weights -f/u echocardiogram -Daily BMP, Mg  Diabetes mellitus, type II ([place]) Fair control, most recent A1c 7.7%. Home  meds: lantus 64u daily, aspart 14u with breakfast, sliding scale with lunch and dinner. -Semglee 30u -sSSI -CBG monitoring qAC and qHS -Not on jardiance due to insurance coverage issues? Will ask pharmacy to look into this and consider starting inpatient  HFpEF 70-75% Appears volume overloaded on exam with 2+ BLE pitting edema and elevated BNP 277.7. Suspect subacute HFpEF exacerbation as she reports >1 month gradually worsening dyspnea on exertion -Last echo in 2021 w/EF [id number], grade 1 diastolic dysfunction -f/u repeat echo -Diuresis as above -Continue home spironolactone  Chronic and stable problems: HTN: continue home amlodipine and irbesartan (valsartan = formulary alternative) HLD: continue home statin (pravastatin = formulary alternative) Gout: continue home allopurinol '100mg'$  daily Anxiety: continue home buspar '15mg'$  BID and sertraline '100mg'$  daily (hold home sertraline '50mg'$  qafternoon and qevening for now) GERD w/dysphagia: continue home Protonix '40mg'$  BID and reglan '5mg'$  TID with meals Hypothyroidism: continue home synthroid 72mg daily; TSH wnl  2 months ago  Seemingly on ASA '81mg'$  daily for primary prevention of ASCVD risk-- consider d/c'ing.  FEN/GI: NPO while on BiPAP, transition to heart healthy diet as appropriate VTE Prophylaxis: Lovenox  Disposition: progressive  History of Present Illness:  Rebecca Walter is a 82y.o. female presenting with shortness of breath.  Patient reports dyspnea on exertion for at least 1 month that has been gradually worsening. Over the past several days seems to be significantly worse and yesterday developed dyspnea at rest. Also with productive cough for the past few days. No fever that she's aware of. No chest pain. Feels her leg swelling is unchanged from her usual. Endorses compliance with her home diuretics. Denies orthopnea. Patient does endorse some anxiety and increased stress as  she is in the process of moving apartments.  Was seen in  PCP office yesterday afternoon for her symptoms. Felt to be pneumonia vs CHF but attempted to treat as an outpatient. Was started on azithromycin and cefdinir. Was also advised to take double her home torsemide yesterday evening. Patient took 1 dose of antibiotics but did not take her torsemide because she wasn't feeling well. Symptoms worsened overnight so she presented to the ED.  O2 sats in the mid 70s per EMS. In the ED presented tachypneic to 29 on nonrebreather so was placed on BiPAP. Afebrile.  Chest x-ray revealed increasing bilateral lung base opacity, suspicious for b/l PNA. No evidence of pulmonary edema. She was given dose of Ceftriaxone and IV Lasix in the ED.   Review Of Systems: Per HPI  Pertinent Past Medical History: HTN HLD T2DM GERD Gout HFpEF Hypothyroidism Remainder reviewed in history tab.   Pertinent Past Surgical History: Knee surgery Kidney surgery Meniscectomy Wrist surgery Remainder reviewed in history tab.   Pertinent Social History: Tobacco use: never Alcohol use: none Other Substance use: none Lives alone. Son lives 3 mins away. Also has home health few times per week.  Pertinent Family History: Mother: Stroke Father: Heart disease, stroke, HTN Sister: Cancer Brother: Cancer, heart disease Remainder reviewed in history tab.   Important Outpatient Medications: Allopurinol 100 mg daily Amlodipine 10 mg daily Aspirin 81 mg daily Baclofen '10mg'$  BID prn BuSpar 15 mg twice daily Jardiance 10 mg daily (not taking, insurance doesn't cover?) Pepcid 20 mg twice daily (not taking, no particular reason) Insulin aspart 15 units a.m., sliding scale with lunch and dinner Insulin lantus 64u daily Synthroid 50 mcg daily Lovastatin 40 mg daily Reglan 5 mg 1 tablet before meals Protonix 40 mg twice daily Ozempic 0.25 mg weekly Zoloft '100mg'$  Spironolactone 25 mg daily Torsemide 10 mg daily as needed Valsartan 320 mg daily Remainder reviewed in  medication history.   Objective: BP (!) 150/87   Pulse 75   Temp 98.2 F (36.8 C) (Oral)   Resp 20   Ht 5' (1.524 m)   Wt 94.9 kg   LMP  (LMP Unknown)   SpO2 97%   BMI 40.86 kg/m  Exam: General: alert, NAD Eyes: PERRL ENTM: deferred, on BiPAP Neck: supple, no JVD appreciated but limited by habitus and bipap  Cardiovascular: RRR, normal S1/S2 without m/r/g Respiratory: normal effort on BiPAP, speaking in full sentences, crackles bilateral lung bases, few scattered expiratory wheezes Gastrointestinal: soft, NTND Ext: 2+ pitting edema bilateral lower extremities up to knees Derm: venous stasis dermatitis lower extremities, otherwise no rashes Neuro: grossly intact, oriented x4, moves all extremities equally Psych: appropriate mood and affect  Labs:  CBC BMET  Recent Labs  Lab 02/11/23 0420 02/11/23 0440  WBC 10.9*  --   HGB 11.9* 11.6*  HCT 34.8* 34.0*  PLT 188  --    Recent Labs  Lab 02/11/23 0420 02/11/23 0440  NA 135 139  K 3.5 3.3*  CL 105  --   CO2 19*  --   BUN 16  --   CREATININE 0.87  --   GLUCOSE 210*  --   CALCIUM 8.6*  --     BNP 277 Troponin 15 > 19 Lactic acid: 1.0 INR: 1.1 Quad respiratory panel: Negative  EKG: sinus rhythm with PACs, 1st degree AV block, no ST/T changes   Imaging Studies Performed: CXR: Increasing bilateral lung base opacity, suspicious for Bilateral pneumonia in this clinical setting. Aspiration and  pleural effusions are also differential considerations. No evidence of pulmonary edema.   Alcus Dad, MD 02/11/2023, 8:53 AM PGY-3, [facility] Intern pager: [phone], text pages welcome Secure chat group [place]

## 2023-02-12 DIAGNOSIS — J189 Pneumonia, unspecified organism: Secondary | ICD-10-CM | POA: Diagnosis not present

## 2023-02-12 DIAGNOSIS — I5032 Chronic diastolic (congestive) heart failure: Secondary | ICD-10-CM | POA: Diagnosis not present

## 2023-02-12 DIAGNOSIS — R7881 Bacteremia: Secondary | ICD-10-CM

## 2023-02-12 DIAGNOSIS — J9601 Acute respiratory failure with hypoxia: Secondary | ICD-10-CM | POA: Diagnosis not present

## 2023-02-12 LAB — BASIC METABOLIC PANEL
Anion gap: 10 (ref 5–15)
BUN: 15 mg/dL (ref 8–23)
CO2: 25 mmol/L (ref 22–32)
Calcium: 8.4 mg/dL — ABNORMAL LOW (ref 8.9–10.3)
Chloride: 103 mmol/L (ref 98–111)
Creatinine, Ser: 0.9 mg/dL (ref 0.44–1.00)
GFR, Estimated: >60 mL/min (ref >60)
Glucose, Bld: 163 mg/dL — ABNORMAL HIGH (ref 70–99)
Potassium: 3.5 mmol/L (ref 3.5–5.1)
Sodium: 138 mmol/L (ref 135–145)

## 2023-02-12 LAB — CBC
HCT: 30.5 % — ABNORMAL LOW (ref 36.0–46.0)
Hemoglobin: 10.5 g/dL — ABNORMAL LOW (ref 12.0–15.0)
MCH: 30.8 pg (ref 26.0–34.0)
MCHC: 34.4 g/dL (ref 30.0–36.0)
MCV: 89.4 fL (ref 80.0–100.0)
Platelets: 192 10*3/uL (ref 150–400)
RBC: 3.41 MIL/uL — ABNORMAL LOW (ref 3.87–5.11)
RDW: 13.2 % (ref 11.5–15.5)
WBC: 10.4 10*3/uL (ref 4.0–10.5)
nRBC: 0 % (ref 0.0–0.2)

## 2023-02-12 LAB — GLUCOSE, CAPILLARY
Glucose-Capillary: 175 mg/dL — ABNORMAL HIGH (ref 70–99)
Glucose-Capillary: 178 mg/dL — ABNORMAL HIGH (ref 70–99)
Glucose-Capillary: 237 mg/dL — ABNORMAL HIGH (ref 70–99)
Glucose-Capillary: 185 mg/dL — ABNORMAL HIGH (ref 70–99)

## 2023-02-12 LAB — MAGNESIUM: Magnesium: 1.8 mg/dL (ref 1.7–2.4)

## 2023-02-12 MED ORDER — POTASSIUM CHLORIDE CRYS ER 20 MEQ PO TBCR
20.0000 meq | EXTENDED_RELEASE_TABLET | Freq: Once | ORAL | Status: AC
  Administered 2023-02-12: 20 meq via ORAL
  Filled 2023-02-12: qty 1

## 2023-02-12 MED ORDER — MAGNESIUM SULFATE IN D5W 1-5 GM/100ML-% IV SOLN
1.0000 g | Freq: Once | INTRAVENOUS | Status: AC
  Administered 2023-02-12: 1 g via INTRAVENOUS
  Filled 2023-02-12: qty 100

## 2023-02-12 MED ORDER — ACETAMINOPHEN 325 MG PO TABS
650.0000 mg | ORAL_TABLET | Freq: Once | ORAL | Status: AC
  Administered 2023-02-12: 650 mg via ORAL
  Filled 2023-02-12: qty 2

## 2023-02-12 NOTE — Progress Notes (Signed)
PT placed on bipap per MD, tolerating well at this time.

## 2023-02-12 NOTE — Assessment & Plan Note (Deleted)
CTAB in upper lung field.  Coarse but improved breath sounds bilateral lower bases. - Management as above

## 2023-02-12 NOTE — Progress Notes (Addendum)
FMTS Interim Progress Note  S:Went to bedside to check on patient, she was briefly awake with the nurse also at bedside fixing her peripheral IV.   O: BP (!) 153/66   Pulse 65   Temp 98.5 F (36.9 C) (Oral)   Resp (!) 22   Ht 5' (1.524 m)   Wt 91.4 kg   LMP  (LMP Unknown)   SpO2 97%   BMI 39.35 kg/m   General: Patient sitting upright comfortably, in no acute distress and CPAP in place. 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 History of HFpEF also likely contributing to patient's symptoms, received lasix 20 mg and held for now. Day team to reassess tomorrow and redose lasix as appropriate. Monitor I/Os. Vitals and orders reviewed and appropriate. Will continue to closely monitor respiratory status, continue CPAP for the night. Remainder of plan per day team.   Donney Dice, DO 02/12/2023, 10:25 PM PGY-3, [facility] Service pager [phone]

## 2023-02-12 NOTE — Progress Notes (Addendum)
Daily Progress Note Intern Pager: [phone]  Patient name: Rebecca Walter record number: [id number] Date of birth: [birth year] Age: 82 y.o. Gender: female  Primary Care Provider: [name], MD Consultants: None Code Status: Full  Pt Overview and Major Events to Date:  02/11/23 - admitted  Assessment and Plan:  Rebecca Walter is an 82 y.o. female presenting with shortness of breath, found to have CHF exacerbation and PNA.    PMH significant for HFpEF, HTN, HLD, T2DM, GERD, Gout    * Acute hypoxemic respiratory failure ([place]) Requiring BiPAP in the ED due to SpO2 mid 70s by EMS and increased work of breathing on arrival. Etiology likely secondary to CAP as well as subacute HFpEF exacerbation. Also wonder whether pulmonary hypertension is playing any role as this was noted on prior echo in 2021 and patient endorsing exertional dyspnea worsening over at least 1 month. Bcx returned w/ actinetobacter calcoaceticus/baumannii overnight, sensitivities to follow, per pharmacy patient switched to Unasyn. WBC stable. Patient increased to 12 L [place] this morning, after CPAP for sleep. Patient dyspneic and tachypneic this morning, reports she felt better when she had CPAP, will place patient on BiPAP. -Continue Unasyn -BiPAP -plan to redose Lasix this afternoon -Continue home spironolactone '25mg'$  daily -Strict I/Os -Daily weights -Daily BMP, Mg  Diabetes mellitus, type II ([place]) Fair control, most recent A1c 7.7% 01/06/23. Home meds: lantus 64u daily, aspart 14u with breakfast, sliding scale with lunch and dinner. CBG range 220-160's yesterday, appears to be within range.  -Semglee 30u -sSSI -CBG monitoring qAC and qHS -Not on jardiance due to insurance coverage issues? Will ask pharmacy to look into this and consider starting inpatient  HFpEF 70-75% Appears euvolemic on exam minimal pitting edema bilaterally. Elevated BNP 277.7 on admission. Suspect subacute HFpEF exacerbation as  she reports >1 month gradually worsening dyspnea on exertion. Echo yesterday showed EF 70-75% w/ LV hyperdynamic fxn, G2DD. UOP 1200 cc overnight, wt on admission 94.9 and 91.4 today. Cr stable. Will consider diuresis in setting of increased O2 demand.  -Consider IV lasix 20 mg x 1  -Continue home spironolactone    FEN/GI: Heart Healthy PPx: Lovenox Dispo:Home pending clinical improvement .   Subjective:  Report's she's doing ok this morning, is a little SOB and felt better while on CPAP overnight.  Objective: Temp:  [98 F (36.7 C)-99.8 F (37.7 C)] 99.8 F (37.7 C) (02/24 1157) Pulse Rate:  [69-93] 75 (02/24 1157) Resp:  [16-38] 19 (02/24 1157) BP: (125-169)/(57-74) 153/74 (02/24 1157) SpO2:  [86 %-98 %] 95 % (02/24 1157) FiO2 (%):  [45 %-60 %] 45 % (02/24 1157) Weight:  [91.4 kg] 91.4 kg (02/24 0500) Physical Exam: General: Conversant, in acute respiratory distress Cardiovascular: RRR, NRMG Respiratory: Course breath sounds, increased work of breathing and tachypnea, patient in respiratory distress Abdomen: Soft, NTTP, non-distended Extremities: minimal pitting edema in legs bilaterally  Laboratory: Most recent CBC Lab Results  Component Value Date   WBC 10.4 02/12/2023   HGB 10.5 (L) 02/12/2023   HCT 30.5 (L) 02/12/2023   MCV 89.4 02/12/2023   PLT 192 02/12/2023   Most recent BMP    Latest Ref Rng & Units 02/12/2023    2:06 AM  BMP  Glucose 70 - 99 mg/dL 163   BUN 8 - 23 mg/dL 15   Creatinine 0.44 - 1.00 mg/dL 0.90   Sodium 135 - 145 mmol/L 138   Potassium 3.5 - 5.1 mmol/L 3.5   Chloride 98 -  111 mmol/L 103   CO2 22 - 32 mmol/L 25   Calcium 8.9 - 10.3 mg/dL 8.4      Imaging/Diagnostic Tests: ECHOCARDIOGRAM COMPLETE  Result Date: 02/11/2023    ECHOCARDIOGRAM REPORT   Patient Name:   Rebecca Walter Date of Exam: 02/11/2023 Medical Rec #:  [id number]      Height:       60.0 in Accession #:    [id number]     Weight:       209.2 lb Date of Birth:  25-Mar-1941       BSA:          1.903 m Patient Age:    78 years       BP:           162/52 mmHg Patient Gender: F              HR:           71 bpm. Exam Location:  Inpatient Procedure: 2D Echo, Cardiac Doppler and Color Doppler Indications:    R06.9 DOE  History:        Patient has prior history of Echocardiogram examinations, most                 recent 01/29/2020. Pneumonia, Signs/Symptoms:Syncope; Risk                 Factors:Hypertension, Diabetes, Dyslipidemia and Non-Smoker.  Sonographer:    Wilkie Aye RVT Referring Phys: [id number] SARA L NEAL  Sonographer Comments: Technically difficult study due to poor echo windows, suboptimal apical window, no subcostal window, suboptimal parasternal window and patient is obese. Image acquisition challenging due to respiratory motion and Patient coughing throughout exam. IMPRESSIONS  1. Difficult study due to poor echo windows.  2. Left ventricular ejection fraction, by estimation, is 70 to 75%. The left ventricle has hyperdynamic function. The left ventricle has no regional wall motion abnormalities. There is mild concentric left ventricular hypertrophy. Left ventricular diastolic parameters are consistent with Grade II diastolic dysfunction (pseudonormalization).  3. Right ventricular systolic function is normal. The right ventricular size is mildly enlarged.  4. Left atrial size was mildly dilated.  5. Right atrial size was mildly dilated.  6. The mitral valve is grossly normal. Mild mitral valve regurgitation. Moderate mitral annular calcification.  7. The aortic valve is tricuspid. There is mild calcification of the aortic valve. There is mild thickening of the aortic valve. Aortic valve regurgitation is trivial. Aortic valve sclerosis/calcification is present, without any evidence of aortic stenosis. Comparison(s): No significant change from prior study. FINDINGS  Left Ventricle: Left ventricular ejection fraction, by estimation, is 70 to 75%. The left ventricle has hyperdynamic  function. The left ventricle has no regional wall motion abnormalities. The left ventricular internal cavity size was normal in size. There is mild concentric left ventricular hypertrophy. Left ventricular diastolic parameters are consistent with Grade II diastolic dysfunction (pseudonormalization). Right Ventricle: The right ventricular size is mildly enlarged. Right vetricular wall thickness was not well visualized. Right ventricular systolic function is normal. Left Atrium: Left atrial size was mildly dilated. Right Atrium: Right atrial size was mildly dilated. Pericardium: There is no evidence of pericardial effusion. Mitral Valve: The mitral valve is grossly normal. There is mild thickening of the mitral valve leaflet(s). There is mild calcification of the mitral valve leaflet(s). Moderate mitral annular calcification. Mild mitral valve regurgitation. Tricuspid Valve: The tricuspid valve is normal in structure. Tricuspid valve regurgitation is trivial. Aortic Valve: The aortic  valve is tricuspid. There is mild calcification of the aortic valve. There is mild thickening of the aortic valve. Aortic valve regurgitation is trivial. Aortic valve sclerosis/calcification is present, without any evidence of aortic stenosis. Aortic valve mean gradient measures 7.0 mmHg. Aortic valve peak gradient measures 13.5 mmHg. Aortic valve area, by VTI measures 3.07 cm. Pulmonic Valve: The pulmonic valve was not well visualized. Pulmonic valve regurgitation is mild. Aorta: The aortic root is normal in size and structure. Venous: The inferior vena cava was not well visualized. IAS/Shunts: The atrial septum is grossly normal.  LEFT VENTRICLE PLAX 2D LVIDd:         5.20 cm LVIDs:         3.40 cm LV PW:         1.40 cm LV IVS:        1.40 cm LVOT diam:     2.10 cm LV SV:         99 LV SV Index:   52 LVOT Area:     3.46 cm  RIGHT VENTRICLE RV S prime:     12.30 cm/s TAPSE (M-mode): 2.8 cm LEFT ATRIUM             Index        RIGHT  ATRIUM           Index LA diam:        4.80 cm 2.52 cm/m   RA Area:     19.10 cm LA Vol (A2C):   61.7 ml 32.43 ml/m  RA Volume:   57.90 ml  30.43 ml/m LA Vol (A4C):   67.3 ml 35.37 ml/m LA Biplane Vol: 65.3 ml 34.32 ml/m  AORTIC VALVE                     PULMONIC VALVE AV Area (Vmax):    3.23 cm      PV Vmax:       1.27 m/s AV Area (Vmean):   3.16 cm      PV Peak grad:  6.4 mmHg AV Area (VTI):     3.07 cm AV Vmax:           183.50 cm/s AV Vmean:          126.000 cm/s AV VTI:            0.323 m AV Peak Grad:      13.5 mmHg AV Mean Grad:      7.0 mmHg LVOT Vmax:         171.00 cm/s LVOT Vmean:        115.000 cm/s LVOT VTI:          0.286 m LVOT/AV VTI ratio: 0.89  AORTA Ao Root diam: 3.10 cm MITRAL VALVE                TRICUSPID VALVE MV Area (PHT): 5.42 cm     TR Peak grad:   12.7 mmHg MV Decel Time: 140 msec     TR Vmax:        178.00 cm/s MV E velocity: 152.00 cm/s MV A velocity: 78.00 cm/s   SHUNTS MV E/A ratio:  1.95         Systemic VTI:  0.29 m                             Systemic Diam: 2.10 cm Gwyndolyn Kaufman MD Electronically signed by Gwyndolyn Kaufman  MD Signature Date/Time: 02/11/2023/3:00:25 PM    Final      Holley Bouche, MD 02/12/2023, 12:44 PM  PGY-2, [place] Intern pager: [phone], text pages welcome Secure chat group [place]

## 2023-02-12 NOTE — Hospital Course (Addendum)
Rebecca Walter is an 82 y.o. female presenting with shortness of breath.  Her hospital course outlined below.  Acute hypoxemic respiratory failure ([place]) Patient required BiPAP in the ED on presentation due to SpO2 in mid 70s and increased work of breathing.  Etiology was initially believed to be CAP as well as subacute HFpEF exacerbation.  Patient started on ceftriaxone/azithromycin for CAP (see pneumonia below for details) and received IV Lasix for diuresis (see HFpEF below for details).  On 2/24 blood cultures were positive for Acinetobacter baumannii (see bacteremia below).  Patient's respiratory status remained tenuous between 2/24 and 2/28.   In addition to management for diagnoses above, 3-day IV methylprednisone course was initiated (2/26 - 2/28).  Repeat CT chest with contrast on 02/15/2023 showed concern of worsening respiratory disease on 02/15/2023-pulmonology was consulted (see pneumonia below).  On 02/16/2023 patient had improved respiratory status, thought to be in combination of antibiotic regimen/steroids/ambulation/diuresis.  On 02/17/2023 patient was able to remain on high flow nasal cannula during day, needing BiPAP only at night.  After workup believe acute hypoxic respiratory failure was multifactorial: Acinetobacter bacteremia, viral pneumonia, fluid retention 2/2 CHF.  Discharged to SNF with 2L oxygen and BiPAP nightly.  Bacteremia Acinetobacter bacteremia.  ID initially curb sided on 02/12/2023 who recommended discontinuing ceftriaxone/azithromycin and starting Unasyn.  Culture showed sensitivity to Unasyn.  Antibiotic start date to include Unasyn, exclude ceftriaxone/azithromycin.  Despite Unasyn treatment, patient was not improving and respiratory status.  Per ID minocycline 200 mg twice daily was added for additional coverage.  Respiratory panel positive for coronavirus OC 43.  MRSA swab negative.  ID recommended 10-day total course of antibiotics (2/24 - 3/6).  Unasyn transitioned to  ciprofloxacin and minocycline on 3/5 for one additional dose prior to discharge to complete full antibiotic course.   Pneumonia of both lower lobes due to infectious organism Initial CXR showing patchy opacities in right and left lung bases suspicious for pneumonia, with possible trace pleural effusions.  Ceftriaxone/azithromycin initiated on 2/23 for CAP.  1 blood culture came back positive for Acinetobacter, she was transitioned to Unasyn.  Despite transition of antibiotic, patient's respiratory status remained tenuous.  Repeat CXR and CT chest with contrast showed worsening respiratory disease.  Pulmonology consulted on 02/15/2023.  Per pulmonology, effusions were too small to drain.  Recommended additional coverage with minocycline (see above), adding flutter valve, ambulating patient.  She was a poor candidate for mechanical ventilation, and after discussion CODE STATUS was transitioned to DNR by her choice on 02/15/2023.  Fortunately, patient's respiratory status significantly improved on 02/17/2023-maintaining SpO2 and normal work of breathing on 5L HFNC, with BiPAP only at night.  HFpEF 70-75% BNP initially elevated to 277.7, with +2 BLE pitting edema.  Suspected subacute HFpEF exacerbation, and was diuresed with 40 mg IV Lasix at presentation.  Echocardiogram on 02/11/2023 showed LVEF of 70-25% with grade 2 diastolic dysfunction.  Patient was diuresed with IV Lasix intermittently throughout hospitalization for fluid retention.  Patient appeared euvolemic on 02/18/2023. Continued on 40 mg Lasix daily.   Other chronic conditions were medically managed with home medications and formulary alternatives as necessary (T2DM)  Follow-up recommendations Recommend checking CBC and temperature, looking for signs of infection.  Recommend diuresis daily with 40 mg Lasix.

## 2023-02-12 NOTE — Progress Notes (Signed)
PT Cancellation Note  Patient Details Name: Rebecca Walter MRN: [id number] DOB: 08-21-1941   Cancelled Treatment:    Reason Eval/Treat Not Completed: Patient not medically ready (discussed with RN; pt with increased WOB and placed on BiPAP)  Wyona Almas, PT, DPT [place]    Deno Etienne 02/12/2023, 11:38 AM

## 2023-02-12 NOTE — Progress Notes (Signed)
OT Cancellation Note  Patient Details Name: Rebecca Walter MRN: [id number] DOB: 1941-05-28   Cancelled Treatment:    Reason Eval/Treat Not Completed: Medical issues which prohibited therapy  Reason Eval/Treat Not Completed: Patient not medically ready (discussed with RN; pt with increased WOB and placed on BiPAP)   Will check on pt next day Kari Baars, [place]  [phone], Thereasa Parkin 02/12/2023, 11:43 AM

## 2023-02-12 NOTE — Assessment & Plan Note (Addendum)
Actinetobacter calcoaceticus/baumannii bacteremia.  ID recommended 10 days of Unasyn + minocycline. Will finish course on 3/5. If discharges can switch Unasyn to ciprofloxacin and continue minocycline as is. -s/p Ceftriaxone and azithromycin (2/23) -Unasyn 3 g every 6 hours (2/24-3/5) -Minocycline 200 mg twice daily (2/27-3/5)

## 2023-02-13 DIAGNOSIS — R7881 Bacteremia: Secondary | ICD-10-CM | POA: Diagnosis not present

## 2023-02-13 DIAGNOSIS — J9601 Acute respiratory failure with hypoxia: Secondary | ICD-10-CM | POA: Diagnosis not present

## 2023-02-13 DIAGNOSIS — J189 Pneumonia, unspecified organism: Secondary | ICD-10-CM

## 2023-02-13 LAB — CBC
HCT: 31.2 % — ABNORMAL LOW (ref 36.0–46.0)
Hemoglobin: 10.4 g/dL — ABNORMAL LOW (ref 12.0–15.0)
MCH: 30.2 pg (ref 26.0–34.0)
MCHC: 33.3 g/dL (ref 30.0–36.0)
MCV: 90.7 fL (ref 80.0–100.0)
Platelets: 211 10*3/uL (ref 150–400)
RBC: 3.44 MIL/uL — ABNORMAL LOW (ref 3.87–5.11)
RDW: 13 % (ref 11.5–15.5)
WBC: 11.3 10*3/uL — ABNORMAL HIGH (ref 4.0–10.5)
nRBC: 0 % (ref 0.0–0.2)

## 2023-02-13 LAB — BASIC METABOLIC PANEL
Anion gap: 8 (ref 5–15)
Anion gap: 13 (ref 5–15)
BUN: 18 mg/dL (ref 8–23)
BUN: 21 mg/dL (ref 8–23)
CO2: 25 mmol/L (ref 22–32)
CO2: 21 mmol/L — ABNORMAL LOW (ref 22–32)
Calcium: 8.3 mg/dL — ABNORMAL LOW (ref 8.9–10.3)
Calcium: 8.8 mg/dL — ABNORMAL LOW (ref 8.9–10.3)
Chloride: 105 mmol/L (ref 98–111)
Chloride: 103 mmol/L (ref 98–111)
Creatinine, Ser: 0.88 mg/dL (ref 0.44–1.00)
Creatinine, Ser: 0.92 mg/dL (ref 0.44–1.00)
GFR, Estimated: >60 mL/min (ref >60)
GFR, Estimated: >60 mL/min (ref >60)
Glucose, Bld: 157 mg/dL — ABNORMAL HIGH (ref 70–99)
Glucose, Bld: 316 mg/dL — ABNORMAL HIGH (ref 70–99)
Potassium: 3 mmol/L — ABNORMAL LOW (ref 3.5–5.1)
Potassium: 3.8 mmol/L (ref 3.5–5.1)
Sodium: 138 mmol/L (ref 135–145)
Sodium: 137 mmol/L (ref 135–145)

## 2023-02-13 LAB — CULTURE, BLOOD (ROUTINE X 2): Special Requests: ADEQUATE

## 2023-02-13 LAB — GLUCOSE, CAPILLARY
Glucose-Capillary: 162 mg/dL — ABNORMAL HIGH (ref 70–99)
Glucose-Capillary: 238 mg/dL — ABNORMAL HIGH (ref 70–99)
Glucose-Capillary: 265 mg/dL — ABNORMAL HIGH (ref 70–99)
Glucose-Capillary: 225 mg/dL — ABNORMAL HIGH (ref 70–99)

## 2023-02-13 LAB — MAGNESIUM: Magnesium: 2 mg/dL (ref 1.7–2.4)

## 2023-02-13 MED ORDER — POTASSIUM CHLORIDE 10 MEQ/100ML IV SOLN
10.0000 meq | INTRAVENOUS | Status: DC
  Administered 2023-02-13: 10 meq via INTRAVENOUS
  Filled 2023-02-13 (×2): qty 100

## 2023-02-13 MED ORDER — POTASSIUM CHLORIDE CRYS ER 20 MEQ PO TBCR
20.0000 meq | EXTENDED_RELEASE_TABLET | Freq: Once | ORAL | Status: AC
  Administered 2023-02-13: 20 meq via ORAL
  Filled 2023-02-13: qty 1

## 2023-02-13 MED ORDER — DICLOFENAC SODIUM 1 % EX GEL
4.0000 g | Freq: Four times a day (QID) | CUTANEOUS | Status: DC
  Administered 2023-02-13 – 2023-02-22 (×24): 4 g via TOPICAL
  Filled 2023-02-13: qty 100

## 2023-02-13 MED ORDER — POTASSIUM CHLORIDE 10 MEQ/100ML IV SOLN
10.0000 meq | INTRAVENOUS | Status: AC
  Administered 2023-02-13 (×3): 10 meq via INTRAVENOUS
  Filled 2023-02-13 (×2): qty 100

## 2023-02-13 MED ORDER — POLYETHYLENE GLYCOL 3350 17 G PO PACK
17.0000 g | PACK | Freq: Every day | ORAL | Status: DC
  Administered 2023-02-13: 17 g via ORAL
  Filled 2023-02-13 (×7): qty 1

## 2023-02-13 NOTE — Progress Notes (Addendum)
FMTS Brief Progress Note  S: Patient states she is doing better each day, she feels improvement when she is able to sit up in the chair rather than laying in bed.  She is continually amenable to using CPAP at night.  O: BP (!) 171/63 (BP Location: Left Wrist)   Pulse 82   Temp 98.1 F (36.7 C) (Oral)   Resp (!) 26   Ht 5' (1.524 m)   Wt 92 kg   LMP  (LMP Unknown)   SpO2 94%   BMI 39.61 kg/m   General: Awake, alert, NAD CV: RRR, no murmurs auscultated Pulm: Transmitted upper airway noise related to congestion, CPAP in place, coarse airway sounds throughout, no increased work of breathing Extremities: +1 pitting edema BLEs  A/P: Acute hypoxemic respiratory failure Continue treatment for Acinetobacter with Unasyn.-Encouraged her to continue with CPAP use at night and in the morning to use incentive spirometry more regularly.  Swelling of lower extremities appears slightly better, likely approaching euvolemia.  Continue plan per day team. - Orders reviewed. Labs for AM ordered, which was adjusted as needed.   Wells Guiles, DO 02/13/2023, 9:20 PM PGY-2, [facility] Night Resident  Please page [phone] with questions.

## 2023-02-13 NOTE — Progress Notes (Signed)
Daily Progress Note Intern Pager: [phone]  Patient name: Rebecca Walter record number: [id number] Date of birth: May 12, 1941 Age: 82 y.o. Gender: female  Primary Care Provider: [name], MD Consultants: None Code Status: FULL  Pt Overview and Major Events to Date:  02/11/23 - admitted   Assessment and Plan:  Elif C Walter is an 82 y.o. female presenting with shortness of breath, found to have CHF exacerbation and PNA.  PMH significant for HFpEF, HTN, HLD, T2DM, GERD, Gout   * Acute hypoxemic respiratory failure ([facility]) Due to bilateral pneumonia.  Was on CPAP overnight and is currently on 5 L high flow nasal cannula saturating in the mid 90s while in the room.  Perhaps small contribution from known chronic CHF HfPEF EF 70-75% but likely not as significant. -Continue Unasyn -BiPAP -Continue home spironolactone '25mg'$  daily -Strict I/Os -Daily weights -Daily BMP, Mg  Diabetes mellitus, type II ([place]) Most recent A1c 7.7% 01/06/23.  Home meds: lantus 64u daily, aspart 14u with breakfast, sliding scale with lunch and dinner.  CBG range 157-237 yesterday, fasting of 162 this AM -Currently giving Semglee 30u with sSSI -CBG monitoring qAC and qHS  Bacteremia Actinetobacter calcoaceticus/baumannii on blood culture. Changed to Unasyn per pharmacy recommendations 2/24. Day 3 total abx therapy, day 2 Unasyn. Unclear total length of course needed, maybe > 7 days.  Pneumonia of both lower lobes due to infectious organism Initial abx 02/11/2023 were CTX and Azithromycin. Blood culture results of bacteremia with actinetobacter calcoaceticus/baumannii. Changed to Unasyn 02/12/2021 Day 3/7 abx therapy. May need longer course given pathogen and the fact that she has required BiPAP and high flow nasal cannula, currently on 6 L high flow nasal cannula.   -Continue Unasyn -Monitor respiratory status closely  HFpEF 70-75% ECHO with HfPEF (EF 70-75%) received one day of mild  diuresis. 900 UOP. Weight 92 from 91.4 kg.  Doubt she needs much more diuresis but will follow closely -Continue home spironolactone -Consider redose of lasix  FEN/GI: Heart Healthy PPx: Lovenox Dispo: Home pending clinical improvement  Subjective:  She feels like her breathing has been slightly better although still feels pretty dyspneic.  She has not been doing her incentive spirometry, we practiced while in the room and she could only get to 250 cc.  I discussed with her to practice this multiple times an hour to help with her lung function.  Objective: Temp:  [98.4 F (36.9 C)-99.8 F (37.7 C)] 99.2 F (37.3 C) (02/25 0526) Pulse Rate:  [65-78] 71 (02/25 0526) Resp:  [19-22] 20 (02/25 0526) BP: (142-163)/(60-74) 156/71 (02/25 0526) SpO2:  [91 %-97 %] 92 % (02/25 0526) FiO2 (%):  [45 %] 45 % (02/24 1157) Weight:  [92 kg] 92 kg (02/25 0526) Physical Exam: General: NAD, alert and responsive to questions, eating breakfast Cardiovascular: RRR, no murmurs or gallops Respiratory: Coarse crackles throughout anterior and posterior lung fields, able to speak full sentences on 5 L high flow nasal cannula Abdomen: Soft, nontender to palpation Extremities: No lower extremity edema or calf tenderness  Laboratory: Most recent CBC Lab Results  Component Value Date   WBC 11.3 (H) 02/13/2023   HGB 10.4 (L) 02/13/2023   HCT 31.2 (L) 02/13/2023   MCV 90.7 02/13/2023   PLT 211 02/13/2023   Most recent BMP    Latest Ref Rng & Units 02/13/2023    1:48 AM  BMP  Glucose 70 - 99 mg/dL 157   BUN 8 - 23 mg/dL 18  Creatinine 0.44 - 1.00 mg/dL 0.88   Sodium 135 - 145 mmol/L 138   Potassium 3.5 - 5.1 mmol/L 3.0   Chloride 98 - 111 mmol/L 105   CO2 22 - 32 mmol/L 25   Calcium 8.9 - 10.3 mg/dL 8.3     Imaging/Diagnostic Tests:  Gerrit Heck, MD 02/13/2023, 9:44 AM  PGY-2, [place] Intern pager: [phone], text pages welcome Secure chat group [place]

## 2023-02-13 NOTE — Evaluation (Signed)
Physical Therapy Evaluation Patient Details Name: Rebecca Walter MRN: [id number] DOB: 06-20-1941 Today's Date: 02/13/2023  History of Present Illness  Rebecca Walter is an 82 y.o. female presenting with shortness of breath, found to have CHF exacerbation and PNA. PMH significant for HFpEF, HTN, HLD, T2DM, GERD, Gout  Clinical Impression   Pt presents with generalized weakness, impaired balance, decreased activity tolerance, dyspnea on exertion with supplemental O2 demands, and unsteady short-distance gait. Pt to benefit from acute PT to address deficits. Pt ambulated short room distance with RW and close guard for safety, pt requiring some physical assist for bed mobility and transfer into standing. Pt lives alone with 8 hours/day 3 days/week aide service, anticipate pt will need increased assist at d/c so recommending ST-SNF. PT to progress mobility as tolerated, and will continue to follow acutely.     On 10LO2 upon PT arrival to room, PT dropped level to 5LO2 with SPO2 88% and greater throughout, left on 5LO2 and RN notified     Recommendations for follow up therapy are one component of a multi-disciplinary discharge planning process, led by the attending physician.  Recommendations may be updated based on patient status, additional functional criteria and insurance authorization.  Follow Up Recommendations Skilled nursing-short term rehab (<3 hours/day) Can patient physically be transported by private vehicle: Yes    Assistance Recommended at Discharge Frequent or constant Supervision/Assistance  Patient can return home with the following  A little help with walking and/or transfers;A little help with bathing/dressing/bathroom    Equipment Recommendations None recommended by PT  Recommendations for Other Services       Functional Status Assessment Patient has had a recent decline in their functional status and demonstrates the ability to make significant improvements in function in a  reasonable and predictable amount of time.     Precautions / Restrictions Precautions Precautions: Fall Precaution Comments: watch SpO2 Restrictions Weight Bearing Restrictions: No      Mobility  Bed Mobility Overal bed mobility: Needs Assistance Bed Mobility: Supine to Sit     Supine to sit: Mod assist     General bed mobility comments: assist for trunk elevation and scooting to EOB, pt states she feels "stuck"    Transfers Overall transfer level: Needs assistance Equipment used: Rolling walker (2 wheels) Transfers: Sit to/from Stand, Bed to chair/wheelchair/BSC Sit to Stand: Min assist, +2 safety/equipment   Step pivot transfers: Min guard, +2 safety/equipment       General transfer comment: assist for initial rise from EOB, transition to min guard for safety for second attempt from recliner.    Ambulation/Gait Ambulation/Gait assistance: Min guard Gait Distance (Feet): 5 Feet Assistive device: Rolling walker (2 wheels) Gait Pattern/deviations: Step-through pattern, Decreased stride length, Trunk flexed Gait velocity: decr     General Gait Details: Cues for proximity to RW, breathing technique  Stairs            Wheelchair Mobility    Modified Rankin (Stroke Patients Only)       Balance Overall balance assessment: Needs assistance Sitting-balance support: Feet supported Sitting balance-Leahy Scale: Good     Standing balance support: Bilateral upper extremity supported, During functional activity, Reliant on assistive device for balance Standing balance-Leahy Scale: Poor                               Pertinent Vitals/Pain Pain Assessment Pain Assessment: Faces Faces Pain Scale: Hurts a little bit Pain  Location: chronic shoulder pain, L Pain Descriptors / Indicators: Discomfort Pain Intervention(s): Limited activity within patient's tolerance, Monitored during session, Repositioned    Home Living Family/patient expects to be  discharged to:: Private residence Living Arrangements: Alone Available Help at Discharge: Personal care attendant;Available PRN/intermittently Type of Home: Apartment Home Access: Level entry       Home Layout: One level Home Equipment: Rollator (4 wheels);Cane - single point;Shower seat;BSC/3in1      Prior Function Prior Level of Function : Independent/Modified Independent             Mobility Comments: rollator vs SPC for monility ADLs Comments: aide assists with household chores and ADLs as needed, online orders groceries, family provides transportation     Hand Dominance   Dominant Hand: Right    Extremity/Trunk Assessment   Upper Extremity Assessment Upper Extremity Assessment: Defer to OT evaluation LUE Deficits / Details: chronic pain at rest, limited use for mobility and ADLs LUE: Unable to fully assess due to pain LUE Coordination: decreased gross motor    Lower Extremity Assessment Lower Extremity Assessment: Generalized weakness    Cervical / Trunk Assessment Cervical / Trunk Assessment: Kyphotic (forward head, rounded shoulders)  Communication   Communication: No difficulties  Cognition Arousal/Alertness: Awake/alert Behavior During Therapy: Anxious Overall Cognitive Status: Within Functional Limits for tasks assessed                                 General Comments: pt reports anxiety due to fear of SOB        General Comments General comments (skin integrity, edema, etc.): SpO2 88% and greater on 5LO2, cues for breathing technique to recover as pt is a mouth breather at baseline per pt report    Exercises     Assessment/Plan    PT Assessment Patient needs continued PT services  PT Problem List Decreased strength;Decreased mobility;Decreased activity tolerance;Decreased balance;Decreased knowledge of use of DME;Pain;Cardiopulmonary status limiting activity       PT Treatment Interventions DME instruction;Therapeutic  activities;Gait training;Therapeutic exercise;Patient/family education;Balance training;Functional mobility training;Neuromuscular re-education    PT Goals (Current goals can be found in the Care Plan section)  Acute Rehab PT Goals PT Goal Formulation: With patient Time For Goal Achievement: 02/27/23 Potential to Achieve Goals: Good    Frequency Min 2X/week     Co-evaluation   Reason for Co-Treatment: Complexity of the patient's impairments (multi-system involvement) PT goals addressed during session: Mobility/safety with mobility;Balance OT goals addressed during session: ADL's and self-care       AM-PAC PT "6 Clicks" Mobility  Outcome Measure Help needed turning from your back to your side while in a flat bed without using bedrails?: A Little Help needed moving from lying on your back to sitting on the side of a flat bed without using bedrails?: A Lot Help needed moving to and from a bed to a chair (including a wheelchair)?: A Lot Help needed standing up from a chair using your arms (e.g., wheelchair or bedside chair)?: A Little Help needed to walk in hospital room?: A Lot Help needed climbing 3-5 steps with a railing? : Total 6 Click Score: 13    End of Session Equipment Utilized During Treatment: Oxygen Activity Tolerance: Patient tolerated treatment well Patient left: in chair;with call bell/phone within reach;with chair alarm set Nurse Communication: Mobility status;Other (comment) (O2 requirements) PT Visit Diagnosis: Other abnormalities of gait and mobility (R26.89);Muscle weakness (generalized) (M62.81)  Time: [id number] PT Time Calculation (min) (ACUTE ONLY): 26 min   Charges:   PT Evaluation $PT Eval Low Complexity: 1 Low          Lion Fernandez S, PT DPT Acute Rehabilitation Services Pager [phone]  Office [phone]   Roca E Ruffin Pyo 02/13/2023, 10:13 AM

## 2023-02-13 NOTE — Evaluation (Addendum)
Occupational Therapy Evaluation Patient Details Name: Rebecca Walter MRN: [id number] DOB: 08-17-1941 Today's Date: 02/13/2023   History of Present Illness Rebecca Walter is an 82 y.o. female presenting with shortness of breath, found to have CHF exacerbation and PNA. PMH significant for HFpEF, HTN, HLD, T2DM, GERD, Gout   Clinical Impression   Rebecca Walter was evaluated s/p the above admission list. She is generally mod I with use of rollator and an aide 3x/wk for  8 hrs/day at baseline. Upon evaluation she was limited by activity tolerance, new O2 demand, anxiety, generalized weakness and unsteady gait. Overall she required mod A for bed mobility and min A to stand and take pivotal steps with RW. Due to the deficits listed below, she requires mod-max A for LB ADLs and min A for UB ADLs. Pt will benefit from continued acute OT services. Recommend d/c to SNF for continued therapy due to limited assist at home.  Pt on 10L O2 upon arrival, bumped to 5L for entire session with SpO2 >90%, resting at 99% while in the chair at the end of the session. RN notified. Educated on PLB throughout.        Recommendations for follow up therapy are one component of a multi-disciplinary discharge planning process, led by the attending physician.  Recommendations may be updated based on patient status, additional functional criteria and insurance authorization.   Follow Up Recommendations  Skilled nursing-short term rehab (<3 hours/day)     Assistance Recommended at Discharge Frequent or constant Supervision/Assistance  Patient can return home with the following A little help with walking and/or transfers;A lot of help with bathing/dressing/bathroom;Assistance with cooking/housework;Assist for transportation;Help with stairs or ramp for entrance    Functional Status Assessment  Patient has had a recent decline in their functional status and demonstrates the ability to make significant improvements in function in a  reasonable and predictable amount of time.  Equipment Recommendations  None recommended by OT       Precautions / Restrictions Precautions Precautions: Fall Precaution Comments: watch SpO2 Restrictions Weight Bearing Restrictions: No      Mobility Bed Mobility Overal bed mobility: Needs Assistance Bed Mobility: Supine to Sit     Supine to sit: Mod assist          Transfers Overall transfer level: Needs assistance Equipment used: Rolling walker (2 wheels) Transfers: Sit to/from Stand, Bed to chair/wheelchair/BSC Sit to Stand: Min assist     Step pivot transfers: Min guard     General transfer comment: min A for initial stand, min G for 2nd      Balance Overall balance assessment: Needs assistance Sitting-balance support: Feet supported Sitting balance-Leahy Scale: Good     Standing balance support: Bilateral upper extremity supported, During functional activity Standing balance-Leahy Scale: Poor Standing balance comment: did not attempt standing functional activity this date                           ADL either performed or assessed with clinical judgement   ADL Overall ADL's : Needs assistance/impaired Eating/Feeding: Independent;Sitting   Grooming: Set up;Sitting   Upper Body Bathing: Minimal assistance;Sitting   Lower Body Bathing: Moderate assistance;Sit to/from stand   Upper Body Dressing : Set up;Sitting   Lower Body Dressing: Moderate assistance;Sit to/from stand   Toilet Transfer: Minimal assistance;Stand-pivot;BSC/3in1;Rolling walker (2 wheels)   Toileting- Clothing Manipulation and Hygiene: Maximal assistance;Sit to/from stand       Functional mobility during ADLs:  Minimal assistance;Rolling walker (2 wheels) General ADL Comments: limited by activity tolerance     Vision Baseline Vision/History: 0 No visual deficits Vision Assessment?: No apparent visual deficits     Perception Perception Perception Tested?: No    Praxis Praxis Praxis tested?: Not tested    Pertinent Vitals/Pain Pain Assessment Pain Assessment: Faces Faces Pain Scale: Hurts a little bit Pain Location: chronic shoulder pain Pain Descriptors / Indicators: Discomfort Pain Intervention(s): Limited activity within patient's tolerance, Monitored during session     Hand Dominance Right   Extremity/Trunk Assessment Upper Extremity Assessment Upper Extremity Assessment: Generalized weakness;LUE deficits/detail LUE Deficits / Details: chronic pain at rest, limited use for mobility and ADLs LUE: Unable to fully assess due to pain LUE Coordination: decreased gross motor   Lower Extremity Assessment Lower Extremity Assessment: Defer to PT evaluation   Cervical / Trunk Assessment Cervical / Trunk Assessment: Kyphotic (mild)   Communication Communication Communication: No difficulties   Cognition Arousal/Alertness: Awake/alert Behavior During Therapy: Anxious Overall Cognitive Status: Within Functional Limits for tasks assessed                                 General Comments: pt reports anxiety due to fear of SOB     General Comments  VSS on 5L; SpO2 briefly to 88%, recovered quickly with PLB. Satting 99% on 5L while sitting in the chair at the end of the session    Exercises     Shoulder Instructions      Home Living Family/patient expects to be discharged to:: Private residence Living Arrangements: Alone Available Help at Discharge: Personal care attendant;Available PRN/intermittently Type of Home: Apartment Home Access: Level entry     Home Layout: One level     Bathroom Shower/Tub: Occupational psychologist: Handicapped height     Home Equipment: Rollator (4 wheels);Cane - single point;Shower seat;BSC/3in1          Prior Functioning/Environment Prior Level of Function : Independent/Modified Independent             Mobility Comments: rollator vs SPC for monility ADLs  Comments: aide assists with household chores and ADLs as needed, online orders groceries, family provides transportation        OT Problem List: Decreased strength;Decreased range of motion;Decreased activity tolerance;Impaired balance (sitting and/or standing);Decreased safety awareness;Decreased knowledge of use of DME or AE;Decreased knowledge of precautions;Impaired UE functional use      OT Treatment/Interventions: Self-care/ADL training;Therapeutic exercise;DME and/or AE instruction;Energy conservation;Patient/family education;Balance training    OT Goals(Current goals can be found in the care plan section) Acute Rehab OT Goals Patient Stated Goal: to feel better OT Goal Formulation: With patient Time For Goal Achievement: 02/27/23 Potential to Achieve Goals: Good ADL Goals Pt Will Perform Grooming: with supervision Pt Will Perform Lower Body Dressing: with min guard assist;sit to/from stand Pt Will Transfer to Toilet: with supervision;ambulating Additional ADL Goal #1: Pt will tolerate at least 8 minuted of OOB activity without sitting rest break  OT Frequency: Min 2X/week    Co-evaluation PT/OT/SLP Co-Evaluation/Treatment: Yes Reason for Co-Treatment: Complexity of the patient's impairments (multi-system involvement)   OT goals addressed during session: ADL's and self-care      AM-PAC OT "6 Clicks" Daily Activity     Outcome Measure Help from another person eating meals?: None Help from another person taking care of personal grooming?: A Little Help from another person toileting, which includes using  toliet, bedpan, or urinal?: A Lot Help from another person bathing (including washing, rinsing, drying)?: A Lot Help from another person to put on and taking off regular upper body clothing?: A Little Help from another person to put on and taking off regular lower body clothing?: A Lot 6 Click Score: 16   End of Session Equipment Utilized During Treatment: Oxygen;Rolling  walker (2 wheels) Nurse Communication: Mobility status  Activity Tolerance: Patient tolerated treatment well Patient left: in chair;with call bell/phone within reach;with chair alarm set  OT Visit Diagnosis: Unsteadiness on feet (R26.81);Other abnormalities of gait and mobility (R26.89);Muscle weakness (generalized) (M62.81)                Time: [id number] OT Time Calculation (min): 26 min Charges:  OT General Charges $OT Visit: 1 Visit OT Evaluation $OT Eval Moderate Complexity: [place], OTR/L [place] [facility] [place] Communication Preferred   Elliot Cousin 02/13/2023, 9:20 AM

## 2023-02-14 ENCOUNTER — Inpatient Hospital Stay (HOSPITAL_COMMUNITY): Payer: Medicare HMO

## 2023-02-14 DIAGNOSIS — R0602 Shortness of breath: Secondary | ICD-10-CM | POA: Diagnosis not present

## 2023-02-14 DIAGNOSIS — J189 Pneumonia, unspecified organism: Secondary | ICD-10-CM | POA: Diagnosis not present

## 2023-02-14 DIAGNOSIS — R7881 Bacteremia: Secondary | ICD-10-CM | POA: Diagnosis not present

## 2023-02-14 DIAGNOSIS — I5032 Chronic diastolic (congestive) heart failure: Secondary | ICD-10-CM | POA: Diagnosis not present

## 2023-02-14 LAB — BASIC METABOLIC PANEL
Anion gap: 8 (ref 5–15)
BUN: 20 mg/dL (ref 8–23)
CO2: 24 mmol/L (ref 22–32)
Calcium: 8.7 mg/dL — ABNORMAL LOW (ref 8.9–10.3)
Chloride: 106 mmol/L (ref 98–111)
Creatinine, Ser: 1.01 mg/dL — ABNORMAL HIGH (ref 0.44–1.00)
GFR, Estimated: 55 mL/min — ABNORMAL LOW (ref >60)
Glucose, Bld: 219 mg/dL — ABNORMAL HIGH (ref 70–99)
Potassium: 4.1 mmol/L (ref 3.5–5.1)
Sodium: 138 mmol/L (ref 135–145)

## 2023-02-14 LAB — GLUCOSE, CAPILLARY
Glucose-Capillary: 182 mg/dL — ABNORMAL HIGH (ref 70–99)
Glucose-Capillary: 172 mg/dL — ABNORMAL HIGH (ref 70–99)
Glucose-Capillary: 158 mg/dL — ABNORMAL HIGH (ref 70–99)
Glucose-Capillary: 315 mg/dL — ABNORMAL HIGH (ref 70–99)

## 2023-02-14 LAB — CBC
HCT: 31.9 % — ABNORMAL LOW (ref 36.0–46.0)
Hemoglobin: 10.6 g/dL — ABNORMAL LOW (ref 12.0–15.0)
MCH: 30.1 pg (ref 26.0–34.0)
MCHC: 33.2 g/dL (ref 30.0–36.0)
MCV: 90.6 fL (ref 80.0–100.0)
Platelets: 239 10*3/uL (ref 150–400)
RBC: 3.52 MIL/uL — ABNORMAL LOW (ref 3.87–5.11)
RDW: 13.2 % (ref 11.5–15.5)
WBC: 12.2 10*3/uL — ABNORMAL HIGH (ref 4.0–10.5)
nRBC: 0 % (ref 0.0–0.2)

## 2023-02-14 LAB — MAGNESIUM: Magnesium: 2.1 mg/dL (ref 1.7–2.4)

## 2023-02-14 MED ORDER — GUAIFENESIN ER 600 MG PO TB12
600.0000 mg | ORAL_TABLET | Freq: Two times a day (BID) | ORAL | Status: DC
  Administered 2023-02-14 – 2023-02-15 (×2): 600 mg via ORAL
  Filled 2023-02-14 (×2): qty 1

## 2023-02-14 MED ORDER — INSULIN ASPART 100 UNIT/ML IJ SOLN
3.0000 [IU] | Freq: Once | INTRAMUSCULAR | Status: AC
  Administered 2023-02-14: 3 [IU] via SUBCUTANEOUS

## 2023-02-14 MED ORDER — METHYLPREDNISOLONE SODIUM SUCC 40 MG IJ SOLR
40.0000 mg | Freq: Every day | INTRAMUSCULAR | Status: AC
  Administered 2023-02-14 – 2023-02-16 (×3): 40 mg via INTRAVENOUS
  Filled 2023-02-14 (×3): qty 1

## 2023-02-14 MED ORDER — ADULT MULTIVITAMIN W/MINERALS CH
1.0000 | ORAL_TABLET | Freq: Every day | ORAL | Status: DC
  Administered 2023-02-15 – 2023-02-22 (×8): 1 via ORAL
  Filled 2023-02-14 (×8): qty 1

## 2023-02-14 MED ORDER — BOOST PLUS PO LIQD
237.0000 mL | Freq: Two times a day (BID) | ORAL | Status: DC
  Administered 2023-02-14 – 2023-02-16 (×4): 237 mL via ORAL
  Filled 2023-02-14 (×7): qty 237

## 2023-02-14 NOTE — TOC Initial Note (Signed)
Transition of Care [facility]) - Initial/Assessment Note    Patient Details  Name: Rebecca Walter MRN: [id number] Date of Birth: [birth year]  [facility] CM/SW Contact:    Milas Gain, [place] Phone Number: 02/14/2023, 8:52 AM  Clinical Narrative:                  CSW received consult for possible SNF placement at time of discharge. CSW spoke with patient regarding PT recommendation of SNF placement at time of discharge. Patient expressed understanding of PT recommendation and is agreeable to SNF placement at time of discharge.Patient requested that CSW give her son Rebecca Walter a call to assist with dc plan. CSW Lvm for patients son Rebecca Walter. CSW awaiting call back.  CSW to continue to follow and assist with discharge planning needs.         Patient Goals and CMS Choice            Expected Discharge Plan and Services                                              Prior Living Arrangements/Services                       Activities of Daily Living Home Assistive Devices/Equipment: Blood pressure cuff, CBG Meter, Eyeglasses, Dentures (specify type), Grab bars around toilet, Grab bars in shower, Hand-held shower hose, Shower chair with back, Walker (specify type) (rolator) ADL Screening (condition at time of admission) Patient's cognitive ability adequate to safely complete daily activities?: Yes Is the patient deaf or have difficulty hearing?: Yes Does the patient have difficulty seeing, even when wearing glasses/contacts?: No Does the patient have difficulty concentrating, remembering, or making decisions?: No Patient able to express need for assistance with ADLs?: Yes Does the patient have difficulty dressing or bathing?: Yes Independently performs ADLs?: No Communication: Independent Dressing (OT): Independent Grooming: Independent Feeding: Independent Bathing: Needs assistance Is this a change from baseline?: Pre-admission baseline Toileting:  Independent In/Out Bed: Independent Walks in Home: Independent Does the patient have difficulty walking or climbing stairs?: Yes Weakness of Legs: Both Weakness of Arms/Hands: Both  Permission Sought/Granted                  Emotional Assessment              Admission diagnosis:  Shortness of breath [R06.02] Acute hypoxemic respiratory failure ([place]) [J96.01] Multifocal pneumonia [J18.9] SOB (shortness of breath) [R06.02] Patient Active Problem List   Diagnosis Date Noted   Multifocal pneumonia 02/13/2023   Bacteremia 02/12/2023   Pneumonia of both lower lobes due to infectious organism 02/11/2023   SOB (shortness of breath) 02/11/2023   Acute hypoxemic respiratory failure ([place]) 02/10/2023   Encounter for immunization 01/09/2023   Skin rash 12/07/2022   Cellulitis of left lower extremity 12/03/2022   Need for immunization against influenza 09/11/2022   Mild cognitive impairment 09/03/2022   Mood disorder ([place]) 09/03/2022   BMI 39.0-39.9,adult 09/02/2022   Morbid obesity ([place]) 06/24/2022   Cerebral atrophy, mild ([place]) 06/24/2022   Cervical spinal stenosis 05/11/2022   Hypothyroidism 05/05/2022   Formed visual hallucinations 04/15/2022   CKD (chronic kidney disease) stage 3, GFR 30-59 ml/min ([place]) 01/13/2022   Esophageal dysphagia 03/27/2021   Abnormal MRI, spinal cord 01/03/2021   Bilateral leg numbness 12/31/2020   Laryngopharyngeal reflux (  LPR) 06/19/2020   Frequent falls 04/17/2020   Restless leg syndrome 01/23/2020   Obstructive sleep apnea 12/04/2019   Diabetic autonomic neuropathy ([place]) 01/24/2019   Chronically dry eyes, right 12/15/2018   Hypertension associated with diabetes ([place]) 10/17/2018   Constipation 07/20/2017   HFpEF 70-75% 07/15/2017   Mild aortic stenosis 06/19/2017   Moderate pulmonary valve insufficiency 06/19/2017   Lumbar back pain with radiculopathy affecting right lower extremity 10/29/2016   Allergic rhinitis 03/22/2011   INSOMNIA  09/16/2009   Gastroparesis 08/12/2009   Anxiety 10/18/2008   Diabetes mellitus, type II ([place]) 02/16/2007   Hyperlipidemia associated with type 2 diabetes mellitus ([place]) 02/16/2007   GASTROESOPHAGEAL REFLUX, NO ESOPHAGITIS 02/16/2007   Osteoarthritis of both knees 02/16/2007   Osteopenia after menopause 02/16/2007   Gout 02/16/2007   PCP:  [name], MD Pharmacy:   [id number] [id number]: [phone]: [phone] [facility] ([place] [address]Phone: [phone]: [phone]    Social Determinants of Health (SDOH) Social History: [place] No Food Insecurity (02/13/2023)  Housing: Low Risk  (02/13/2023)  Transportation Needs: No Transportation Needs (02/13/2023)  Utilities: Not At Risk (02/13/2023)  Alcohol Screen: Low Risk  (11/17/2021)  Depression (PHQ2-9): Low Risk  (02/10/2023)  Financial Resource Strain: Low Risk  (11/17/2021)  Physical Activity: Inactive (11/17/2021)  Social Connections: Moderately Integrated (11/17/2021)  Stress: No Stress Concern Present (11/17/2021)  Tobacco Use: Low Risk  (02/11/2023)   SDOH Interventions: Food Insecurity Interventions: Intervention Not Indicated Housing Interventions: Intervention Not Indicated Transportation Interventions: Intervention Not Indicated Utilities Interventions: Intervention Not Indicated   Readmission Risk Interventions     No data to display

## 2023-02-14 NOTE — Progress Notes (Signed)
Curbside Dr. Wilder Glade ID.  Recommend 7-10 day course. Treatment start date is the day we switched to Unasyn (2/24). Anticipate 2/24-3/1. Continue Unasyn 3g q6h. Once patient shows signs of improvement, recall ID to discuss appropriate step down regimen.

## 2023-02-14 NOTE — Progress Notes (Addendum)
FMTS Interim Progress Note  S: Went to bedside to evaluate patient after placing on BiPAP.  Informed patient she will be receiving CT scan-she is in agreement with plan.  Updated son who was at bedside about today's events and plan.  O: BP (!) 169/69 (BP Location: Left Arm)   Pulse 80   Temp 98.2 F (36.8 C) (Oral)   Resp (!) 21   Ht 5' (1.524 m)   Wt 90 kg   LMP  (LMP Unknown)   SpO2 94%   BMI 38.75 kg/m    General: NAD, resting comfortably Respiratory: Normal work of breathing on BiPAP, patient much more comfortable, oxygen saturations 92-95%.  A/P: Acute hypoxic respiratory failure Patient is breathing comfortably and maintaining appropriate oxygen saturations on BiPAP. - Obtain CT chest with contrast - Continue BiPAP for patient comfort - Solu-Medrol x 3 days - Consider pulmonology consult  Leslie Dales, DO 02/14/2023, 5:40 PM PGY-1, [place] Medicine Service pager [phone]

## 2023-02-14 NOTE — Inpatient Diabetes Management (Signed)
Inpatient Diabetes Program Recommendations  AACE/ADA: New Consensus Statement on Inpatient Glycemic Control (2015)  Target Ranges:  Prepandial:   less than 140 mg/dL      Peak postprandial:   less than 180 mg/dL (1-2 hours)      Critically ill patients:  140 - 180 mg/dL   Lab Results  Component Value Date   GLUCAP 182 (H) 02/14/2023   HGBA1C 7.7 (A) 01/06/2023    Review of Glycemic Control  Latest Reference Range & Units 02/13/23 07:28 02/13/23 12:18 02/13/23 16:12 02/13/23 21:26 02/14/23 07:31  Glucose-Capillary 70 - 99 mg/dL 162 (H) 238 (H) 265 (H) 225 (H) 182 (H)   Diabetes history: DM 2 Outpatient Diabetes medications:  Novolog 0-15 units tid, Lantus 64 units Daily Current orders for Inpatient glycemic control:  Semglee 30 units Daily Novolog 0-9 units tid  A1c 7.7% on 1/18  Inpatient Diabetes Program Recommendations:    -  Consider adding Novolog 2-3 units tid meal coverage with parameters (pt to eat at least 50% of meals) to prevent postprandial spikes -  Consider adding Novolog hs scale  Thanks,  Tama Headings RN, MSN, BC-ADM Inpatient Diabetes Coordinator Team Pager [phone] (8a-5p)

## 2023-02-14 NOTE — Progress Notes (Signed)
CALL PAGER [phone] for any questions or notifications regarding this patient  FMTS Attending Note: Rebecca Mcmurray MD Patient is continuing to require BiPap or CPAp to feel comfortable. When we transition her to hi flow oxygen for meals, she is quite dyspneic. I have ordered repeat CXR and am considering CT scan this evening. We curb-sided ID and double checked appropriateness of current antibiotic therapy with Unasyn and they agreed. They offered to touch base tomorrow which we will likely do. I am also considering adding stress dose of steroids for 3 days as she does not seem to be improving much. I would expect her to be less dyspneic by this point. Also considered consulting pulmonology if we do not make some progress wit the addition of steroids.   CT scan if CXR looks worse.

## 2023-02-14 NOTE — Progress Notes (Addendum)
RE: Rebecca Walter  Date of Birth: January 29, 1941  Date: 02/14/2023  To Whom It May Concern:  Please be advised that the above-named patient will require a short-term nursing home stay - anticipated 30 days or less for rehabilitation and strengthening. The plan is for return home.

## 2023-02-14 NOTE — Progress Notes (Signed)
Initial Nutrition Assessment  DOCUMENTATION CODES:   Obesity unspecified  INTERVENTION:  Will downgrade diet to dysphagia 3 (mechanical soft) as pt reports difficulty chewing regular texture diet.  Provide Boost Plus po BID, each supplement provides 360 kcal and 14 grams of protein.  Provide Magic cup BID with lunch and dinner, each supplement provides 290 kcal and 9 grams of protein.  Provide multivitamin with minerals po daily.  NUTRITION DIAGNOSIS:   Inadequate oral intake related to decreased appetite, acute illness as evidenced by per patient/family report.  GOAL:   Patient will meet greater than or equal to 90% of their needs  MONITOR:   PO intake, Supplement acceptance, Labs, Weight trends, I & O's  REASON FOR ASSESSMENT:   Consult Assessment of nutrition requirement/status  ASSESSMENT:   82 year old female with PMHx of HFpEF, HTN, HLD, T2DM, GERD, gout admitted with shortness of breath found to have CHF exacerbation and PNA, also with bacteremia.  Met with pt at bedside. She is currently on CPAP and reports she has been on and off CPAP all day. She reports she was able to come off to eat breakfast this morning (eggs) and plans to continue to come off CPAP to eat at meals but overall acutely having decreased appetite. She reports she was able to eat well yesterday and typically has a good appetite and intake at baseline. Pt reports typically eating 2 meals per day. For breakfast she has grits, scrambled eggs, sausage, yogurt, and fruit. For dinner she usually has chicken and vegetable soup. Pt has full upper dentures and reports some difficulty chewing at baseline (typically eats softer foods). She denies any food allergies or intolerances. Denies nausea, emesis, or abdominal pain. Pt would benefit from downgrading diet to dysphagia 3 (mechanical soft) due to report of difficulty chewing regular texture diet. She is amenable to drinking oral nutrition supplements to help  meet calorie/protein needs during acute illness. She prefers Boost Plus over Ensure supplements and would also like to try [employer]. She also requests a multivitamin daily as that is what she takes at home.  Pt denies any unintentional weight loss PTA and reports she is fairly weight-stable. She reports her UBW is 200-207 lbs. Noted weights fluctuate between 90-95 kg for the past few months. Admission wt was 94.9 kg on 2/23, but unsure of accuracy as pt was 91.4 kg on 2/24. Current wt is 90 kg. Will continue to monitor trends.  Medications reviewed and include: allopurinol, Novolog 0-9 units TID, Semglee 30 units daily, levothyroxine, Reglan 5 mg TID before meals, pantoprazole, Miralax 17 grams daily, sertraline, spironolactone, Unasyn  Labs reviewed: CBG 172-265, Creatinine 1.02 Most recent HgbA1c 6.6 on 01/30/21  UOP: 550 mL (0.3 mL/kg/hr) + 1 occurrence unmeasured UOP  I/O: -990 mL since admission  Pt does not meet criteria for malnutrition at this time.  NUTRITION - FOCUSED PHYSICAL EXAM:  Flowsheet Row Most Recent Value  Orbital Region No depletion  Upper Arm Region No depletion  Thoracic and Lumbar Region No depletion  Buccal Region Unable to assess  Temple Region No depletion  Clavicle Bone Region No depletion  Clavicle and Acromion Bone Region No depletion  Scapular Bone Region No depletion  Dorsal Hand No depletion  Patellar Region No depletion  Anterior Thigh Region No depletion  Posterior Calf Region Moderate depletion  Edema (RD Assessment) Mild  Hair Reviewed  Eyes Reviewed  Mouth Unable to assess  [Unable to assess as pt with CPAP on. Reports has  upper dentures.]  Skin Reviewed  Nails Reviewed      Diet Order:   Diet Order             Diet Heart Room service appropriate? Yes; Fluid consistency: Thin  Diet effective now                  EDUCATION NEEDS:   Not appropriate for education at this time  Skin:  Skin Assessment: Reviewed RN  Assessment  Last BM:  02/13/23 - large type 5  Height:   Ht Readings from Last 1 Encounters:  02/11/23 5' (1.524 m)   Weight:   Wt Readings from Last 1 Encounters:  02/14/23 90 kg   Ideal Body Weight:  45.5 kg  BMI:  Body mass index is 38.75 kg/m.  Estimated Nutritional Needs:   Kcal:  1600-1800  Protein:  80-90 grams  Fluid:  1.6-1.8 L/day  Loanne Drilling, MS, RD, LDN, CNSC Pager number available on Amion

## 2023-02-14 NOTE — Progress Notes (Signed)
FMTS Brief Progress Note  S: Patient notes that she feels much more comfortable on BiPAP now than she did previously.  O: BP (!) 166/76   Pulse 80   Temp 99.6 F (37.6 C) (Axillary)   Resp (!) 21   Ht 5' (1.524 m)   Wt 90 kg   LMP  (LMP Unknown)   SpO2 94%   BMI 38.75 kg/m   General: Resting comfortably, NAD CV: RRR, no murmurs auscultated Pulm: Transmitted upper airway sounds, significant pulmonary congestion, BiPAP in place, coarse breath sounds throughout Extremities: +1 pitting edema of BLEs  A/P: Acute hypoxic respiratory failure Unfortunately worsened respiratory status throughout the day with worsened CXR: CT chest ordered by ED team in addition to Solu-Medrol and encouragement of consistent BiPAP use in the setting of dyspneic appearance.  Likely will get CT tomorrow a.m. stable at this time.  Will continue to monitor respiratory status overnight. - Orders reviewed. Labs for AM ordered, which was adjusted as needed.   Wells Guiles, DO 02/14/2023, 9:21 PM PGY-2, [facility] Night Resident  Please page [phone] with questions.

## 2023-02-14 NOTE — Progress Notes (Signed)
Mobility Specialist Progress Note   02/14/23 1121  Mobility  Activity Contraindicated/medical hold   Patient not appropriate for ambulation at this time given level of complexity, physical assist, and/or precautions as advised by RN. Currently hypoxic and not tolerating mobility despite multiple interventions. MS to hold today, will continue to follow.  [place] Avyay Coger, BS EXP Mobility Specialist Please contact via SecureChat or Rehab office at [phone]

## 2023-02-14 NOTE — NC FL2 (Addendum)
[place] LEVEL OF CARE FORM     IDENTIFICATION  Patient Name: Rebecca Walter Birthdate: 07/11/1941 Sex: female Admission Date (Current Location): 02/11/2023  [facility] and [place] Number:  Herbalist and Address:  [facility], [place] [address] [place], [place] [address]      Provider Number: [id number]  Attending Physician Name and Address:  Dickie La, MD  Relative Name and Phone Number:  [name] [phone]    Current Level of Care: Hospital Recommended Level of Care: [place] Prior Approval Number:    Date Approved/Denied:   PASRR Number: PASRR under review  Discharge Plan: SNF    Current Diagnoses: Patient Active Problem List   Diagnosis Date Noted   Multifocal pneumonia 02/13/2023   Bacteremia 02/12/2023   Pneumonia of both lower lobes due to infectious organism 02/11/2023   SOB (shortness of breath) 02/11/2023   Acute hypoxemic respiratory failure ([place]) 02/10/2023   Encounter for immunization 01/09/2023   Skin rash 12/07/2022   Cellulitis of left lower extremity 12/03/2022   Need for immunization against influenza 09/11/2022   Mild cognitive impairment 09/03/2022   Mood disorder ([place]) 09/03/2022   BMI 39.0-39.9,adult 09/02/2022   Morbid obesity ([place]) 06/24/2022   Cerebral atrophy, mild ([place]) 06/24/2022   Cervical spinal stenosis 05/11/2022   Hypothyroidism 05/05/2022   Formed visual hallucinations 04/15/2022   CKD (chronic kidney disease) stage 3, GFR 30-59 ml/min ([place]) 01/13/2022   Esophageal dysphagia 03/27/2021   Abnormal MRI, spinal cord 01/03/2021   Bilateral leg numbness 12/31/2020   Laryngopharyngeal reflux (LPR) 06/19/2020   Frequent falls 04/17/2020   Restless leg syndrome 01/23/2020   Obstructive sleep apnea 12/04/2019   Diabetic autonomic neuropathy ([place]) 01/24/2019   Chronically dry eyes, right 12/15/2018   Hypertension associated with diabetes ([place]) 10/17/2018    Constipation 07/20/2017   HFpEF 70-75% 07/15/2017   Mild aortic stenosis 06/19/2017   Moderate pulmonary valve insufficiency 06/19/2017   Lumbar back pain with radiculopathy affecting right lower extremity 10/29/2016   Allergic rhinitis 03/22/2011   INSOMNIA 09/16/2009   Gastroparesis 08/12/2009   Anxiety 10/18/2008   Diabetes mellitus, type II ([place]) 02/16/2007   Hyperlipidemia associated with type 2 diabetes mellitus ([place]) 02/16/2007   GASTROESOPHAGEAL REFLUX, NO ESOPHAGITIS 02/16/2007   Osteoarthritis of both knees 02/16/2007   Osteopenia after menopause 02/16/2007   Gout 02/16/2007    Orientation RESPIRATION BLADDER Height & Weight     Self, Time, Situation, Place  O2 Incontinent, External catheter (External Urinary Catheter) Weight: 198 lb 6.6 oz (90 kg) Height:  5' (152.4 cm)  BEHAVIORAL SYMPTOMS/MOOD NEUROLOGICAL BOWEL NUTRITION STATUS      Continent Diet (Please see discharge summary)  AMBULATORY STATUS COMMUNICATION OF NEEDS Skin   Limited Assist Verbally Other (Comment) (WDL,Wound/Incision LDAs)                       Personal Care Assistance Level of Assistance  Bathing, Feeding, Dressing Bathing Assistance: Limited assistance Feeding assistance: Independent Dressing Assistance: Limited assistance     Functional Limitations Info  Sight, Hearing, Speech Sight Info: Adequate (WDL) Hearing Info: Adequate Speech Info: Adequate    SPECIAL CARE FACTORS FREQUENCY  PT (By licensed PT), OT (By licensed OT)     PT Frequency: 5x min weekly OT Frequency: 5x min weekly            Contractures Contractures Info: Not present    Additional Factors Info  Code  Status, Allergies, Psychotropic, Insulin Sliding Scale Code Status Info: FULL Allergies Info: Acyclovir,Baclofen,Hydrochlorothiazide,Liraglutide,Losartan Potassium,Lotensin (benazepril),Benazepril Hcl,Metoprolol Succinate (metoprolol),Beta Adrenergic Blockers,Other-metoprolol reaction Psychotropic Info:  busPIRone (BUSPAR) tablet 15 mg 2 times daily,sertraline (ZOLOFT) tablet 100 mg daily,sertraline (ZOLOFT) tablet 50 mg 2 times per day Insulin Sliding Scale Info: insulin aspart (novoLOG) injection 0-9 Units 3 times daily with meals,insulin glargine-yfgn [facility]) injection 30 Units daily,       Current Medications (02/14/2023):  This is the current hospital active medication list Current Facility-Administered Medications  Medication Dose Route Frequency Provider Last Rate Last Admin   allopurinol (ZYLOPRIM) tablet 100 mg  100 mg Oral Daily Alcus Dad, MD   100 mg at 02/14/23 0852   amLODipine (NORVASC) tablet 10 mg  10 mg Oral Daily Alcus Dad, MD   10 mg at 02/14/23 0852   Ampicillin-Sulbactam (UNASYN) 3 g in sodium chloride 0.9 % 100 mL IVPB  3 g Intravenous Q6H Leslie Dales, DO 200 mL/hr at 02/14/23 1302 3 g at 02/14/23 1302   busPIRone (BUSPAR) tablet 15 mg  15 mg Oral BID Alcus Dad, MD   15 mg at 02/14/23 [id number]   diclofenac Sodium (VOLTAREN) 1 % topical gel 4 g  4 g Topical QID Gerrit Heck, MD   4 g at 02/14/23 0853   enoxaparin (LOVENOX) injection 40 mg  40 mg Subcutaneous Q24H Alcus Dad, MD   40 mg at 02/14/23 0853   insulin aspart (novoLOG) injection 0-9 Units  0-9 Units Subcutaneous TID WC Alcus Dad, MD   2 Units at 02/14/23 1300   insulin glargine-yfgn (SEMGLEE) injection 30 Units  30 Units Subcutaneous Daily Alcus Dad, MD   30 Units at 02/13/23 1900   irbesartan (AVAPRO) tablet 300 mg  300 mg Oral Daily Alcus Dad, MD   300 mg at 02/14/23 [id number]   levothyroxine (SYNTHROID) tablet 50 mcg  50 mcg Oral Daily Alcus Dad, MD   50 mcg at 02/14/23 0615   metoCLOPramide (REGLAN) tablet 5 mg  5 mg Oral TID [facility], MD   5 mg at 02/14/23 1300   Oral care mouth rinse  15 mL Mouth Rinse 4 times per day Dickie La, MD   15 mL at 02/14/23 1306   Oral care mouth rinse  15 mL Mouth Rinse PRN Dickie La, MD       pantoprazole (PROTONIX)  EC tablet 40 mg  40 mg Oral BID Alcus Dad, MD   40 mg at 02/14/23 0852   polyethylene glycol (MIRALAX / GLYCOLAX) packet 17 g  17 g Oral Daily Gerrit Heck, MD   17 g at 02/13/23 1204   pravastatin (PRAVACHOL) tablet 40 mg  40 mg Oral q1800 Alcus Dad, MD   40 mg at 02/13/23 1714   sertraline (ZOLOFT) tablet 100 mg  100 mg Oral Daily Alcus Dad, MD   100 mg at 02/14/23 0854   sertraline (ZOLOFT) tablet 50 mg  50 mg Oral 2 times per day Gerrit Heck, MD   50 mg at 02/13/23 2241   spironolactone (ALDACTONE) tablet 25 mg  25 mg Oral Daily Alcus Dad, MD   25 mg at 02/14/23 [id number]     Discharge Medications: Please see discharge summary for a list of discharge medications.  Relevant Imaging Results:  Relevant Lab Results:   Additional Information [phone]  Milas Gain, LCSWA

## 2023-02-14 NOTE — Progress Notes (Addendum)
Daily Progress Note Intern Pager: [phone]  Patient name: Rebecca Walter record number: [id number] Date of birth: 1941/08/25 Age: 82 y.o. Gender: female  Primary Care Provider: [name], MD Consultants: None Code Status: Full code  Pt Overview and Major Events to Date:  2/23: Admitted  Assessment and Plan: Rebecca Walter is an 82 y.o. female presenting with shortness of breath, found to have CHF exacerbation and PNA.  PMH significant for HFpEF, HTN, HLD, T2DM, GERD, Gout   * Acute hypoxemic respiratory failure ([place]) Primarily suspect AHRF 2/2 bilateral pneumonia.  Currently on CPAP, with saturations between 90-92% while in room.  Previously, suspected CHF HFpEF was contributing to clinical picture-however patient appears near euvolemic on exam today without respiratory improvement. Was covered by CTX and Azithromycin 2/23, transitioned to Unasyn 2/24. -Continue Unasyn (2/24-) -CPAP  Bacteremia Actinetobacter calcoaceticus/baumannii on blood culture. Changed to Unasyn per pharmacy recommendations 2/24.  Plan to speak with ID to discuss antibiotic duration. -Ceftriaxone and azithromycin (2/23) -Unasyn 3 g every 6 hours (2/24-)  Pneumonia of both lower lobes due to infectious organism Initially started on ceftriaxone azithromycin, discontinue on 2/23.  Started on Unasyn 2/24 for bacteremia as above.  On CPAP, normal work of breathing, satting between 90-92%.  Patient reports no significant improvement in respiratory effort. - Monitor respiratory status closely - Incentive spirometry - Continue antibiotic therapy as above  HFpEF 70-75% Approximately net -1L, with multiple unmeasured voids.  Weight 90 kg, down from 92 kg.  Trace edema BLE, much improved.  Close to euvolemic on exam.  Doubt she needs more diuresis but will follow closely. -Continue home spironolactone '25mg'$  daily -Strict I/Os -Daily weights -Daily BMP, Mg  Diabetes mellitus, type II ([place]) Most  recent A1c 7.7% 01/06/23.  Home meds: lantus 64u daily, aspart 14u with breakfast, sliding scale with lunch and dinner.  CBG range 157-237 yesterday, fasting of 162 this AM -Currently giving Semglee 30u with sSSI -CBG monitoring qAC and qHS   HTN Pressures remain elevated.  Currently on irbesartan 300 mg daily, amlodipine 10 mg daily. - Continue to monitor  FEN/GI: Heart healthy PPx: Lovenox Dispo: Pending clinical improvement  Subjective:  Patient reports no improvement in her respiratory symptoms.  Has improvement in swelling.  Objective: Temp:  [98.1 F (36.7 C)-99.4 F (37.4 C)] 98.4 F (36.9 C) (02/26 0734) Pulse Rate:  [67-85] 67 (02/26 0734) Resp:  [15-26] 20 (02/26 0734) BP: (155-171)/(58-88) 162/77 (02/26 0734) SpO2:  [89 %-94 %] 89 % (02/26 0734) FiO2 (%):  [89 %] 89 % (02/25 2010) Weight:  [90 kg] 90 kg (02/26 0355) Physical Exam: General: NAD, chronically ill-appearing, awake and interactive Cardiovascular: RRR, no murmurs Respiratory: Coarse breath sounds throughout.  CPAP in place.  Normal work of breathing on CPAP. Extremities: Trace bilateral lower extremity edema  Laboratory: Most recent CBC Lab Results  Component Value Date   WBC 12.2 (H) 02/14/2023   HGB 10.6 (L) 02/14/2023   HCT 31.9 (L) 02/14/2023   MCV 90.6 02/14/2023   PLT 239 02/14/2023   Most recent BMP    Latest Ref Rng & Units 02/14/2023    1:46 AM  BMP  Glucose 70 - 99 mg/dL 219   BUN 8 - 23 mg/dL 20   Creatinine 0.44 - 1.00 mg/dL 1.01   Sodium 135 - 145 mmol/L 138   Potassium 3.5 - 5.1 mmol/L 4.1   Chloride 98 - 111 mmol/L 106   CO2 22 - 32 mmol/L 24  Calcium 8.9 - 10.3 mg/dL 8.7     Leslie Dales, DO 02/14/2023, 11:34 AM  PGY-1, [place] Intern pager: [phone], text pages welcome Secure chat group [place]

## 2023-02-14 NOTE — Care Management Important Message (Signed)
Important Message  Patient Details  Name: Rebecca Walter MRN: [id number] Date of Birth: Sep 29, 1941   Medicare Important Message Given:  Yes     Shelda Altes 02/14/2023, 9:00 AM

## 2023-02-15 ENCOUNTER — Inpatient Hospital Stay (HOSPITAL_COMMUNITY): Payer: Medicare HMO

## 2023-02-15 ENCOUNTER — Other Ambulatory Visit: Payer: Self-pay | Admitting: Family Medicine

## 2023-02-15 DIAGNOSIS — Z794 Long term (current) use of insulin: Secondary | ICD-10-CM

## 2023-02-15 DIAGNOSIS — E1143 Type 2 diabetes mellitus with diabetic autonomic (poly)neuropathy: Secondary | ICD-10-CM

## 2023-02-15 DIAGNOSIS — J189 Pneumonia, unspecified organism: Secondary | ICD-10-CM | POA: Diagnosis not present

## 2023-02-15 DIAGNOSIS — I5032 Chronic diastolic (congestive) heart failure: Secondary | ICD-10-CM | POA: Diagnosis not present

## 2023-02-15 DIAGNOSIS — J9601 Acute respiratory failure with hypoxia: Secondary | ICD-10-CM | POA: Diagnosis not present

## 2023-02-15 DIAGNOSIS — E1169 Type 2 diabetes mellitus with other specified complication: Secondary | ICD-10-CM

## 2023-02-15 DIAGNOSIS — R7881 Bacteremia: Secondary | ICD-10-CM | POA: Diagnosis not present

## 2023-02-15 LAB — CBC WITH DIFFERENTIAL/PLATELET
Abs Immature Granulocytes: 0.08 10*3/uL — ABNORMAL HIGH (ref 0.00–0.07)
Basophils Absolute: 0 10*3/uL (ref 0.0–0.1)
Basophils Relative: 0 %
Eosinophils Absolute: 0 10*3/uL (ref 0.0–0.5)
Eosinophils Relative: 0 %
HCT: 31.5 % — ABNORMAL LOW (ref 36.0–46.0)
Hemoglobin: 10.4 g/dL — ABNORMAL LOW (ref 12.0–15.0)
Immature Granulocytes: 1 %
Lymphocytes Relative: 6 %
Lymphs Abs: 0.5 10*3/uL — ABNORMAL LOW (ref 0.7–4.0)
MCH: 30.1 pg (ref 26.0–34.0)
MCHC: 33 g/dL (ref 30.0–36.0)
MCV: 91.3 fL (ref 80.0–100.0)
Monocytes Absolute: 0.5 10*3/uL (ref 0.1–1.0)
Monocytes Relative: 5 %
Neutro Abs: 7.7 10*3/uL (ref 1.7–7.7)
Neutrophils Relative %: 88 %
Platelets: 213 10*3/uL (ref 150–400)
RBC: 3.45 MIL/uL — ABNORMAL LOW (ref 3.87–5.11)
RDW: 13 % (ref 11.5–15.5)
WBC: 8.8 10*3/uL (ref 4.0–10.5)
nRBC: 0 % (ref 0.0–0.2)

## 2023-02-15 LAB — RESPIRATORY PANEL BY PCR

## 2023-02-15 LAB — BASIC METABOLIC PANEL
Anion gap: 10 (ref 5–15)
BUN: 24 mg/dL — ABNORMAL HIGH (ref 8–23)
CO2: 24 mmol/L (ref 22–32)
Calcium: 8.6 mg/dL — ABNORMAL LOW (ref 8.9–10.3)
Chloride: 104 mmol/L (ref 98–111)
Creatinine, Ser: 0.86 mg/dL (ref 0.44–1.00)
GFR, Estimated: >60 mL/min (ref >60)
Glucose, Bld: 281 mg/dL — ABNORMAL HIGH (ref 70–99)
Potassium: 4.1 mmol/L (ref 3.5–5.1)
Sodium: 138 mmol/L (ref 135–145)

## 2023-02-15 LAB — GLUCOSE, CAPILLARY
Glucose-Capillary: 170 mg/dL — ABNORMAL HIGH (ref 70–99)
Glucose-Capillary: 225 mg/dL — ABNORMAL HIGH (ref 70–99)
Glucose-Capillary: 274 mg/dL — ABNORMAL HIGH (ref 70–99)
Glucose-Capillary: 323 mg/dL — ABNORMAL HIGH (ref 70–99)
Glucose-Capillary: 354 mg/dL — ABNORMAL HIGH (ref 70–99)

## 2023-02-15 LAB — MAGNESIUM: Magnesium: 2.2 mg/dL (ref 1.7–2.4)

## 2023-02-15 LAB — MRSA NEXT GEN BY PCR, NASAL: MRSA by PCR Next Gen: NOT DETECTED

## 2023-02-15 MED ORDER — INSULIN ASPART 100 UNIT/ML IJ SOLN
0.0000 [IU] | Freq: Every day | INTRAMUSCULAR | Status: DC
  Administered 2023-02-15: 4 [IU] via SUBCUTANEOUS
  Administered 2023-02-16: 5 [IU] via SUBCUTANEOUS
  Administered 2023-02-17: 4 [IU] via SUBCUTANEOUS
  Administered 2023-02-18: 5 [IU] via SUBCUTANEOUS
  Administered 2023-02-19 – 2023-02-20 (×2): 3 [IU] via SUBCUTANEOUS

## 2023-02-15 MED ORDER — FUROSEMIDE 10 MG/ML IJ SOLN
40.0000 mg | Freq: Once | INTRAMUSCULAR | Status: AC
  Administered 2023-02-15: 40 mg via INTRAVENOUS
  Filled 2023-02-15: qty 4

## 2023-02-15 MED ORDER — IOHEXOL 350 MG/ML SOLN
50.0000 mL | Freq: Once | INTRAVENOUS | Status: AC | PRN
  Administered 2023-02-15: 50 mL via INTRAVENOUS

## 2023-02-15 MED ORDER — GUAIFENESIN ER 600 MG PO TB12
1200.0000 mg | ORAL_TABLET | Freq: Two times a day (BID) | ORAL | Status: DC
  Administered 2023-02-15 – 2023-02-22 (×14): 1200 mg via ORAL
  Filled 2023-02-15 (×14): qty 2

## 2023-02-15 MED ORDER — INSULIN ASPART 100 UNIT/ML IJ SOLN
0.0000 [IU] | Freq: Three times a day (TID) | INTRAMUSCULAR | Status: DC
  Administered 2023-02-15: 2 [IU] via SUBCUTANEOUS
  Administered 2023-02-15: 9 [IU] via SUBCUTANEOUS
  Administered 2023-02-16: 2 [IU] via SUBCUTANEOUS
  Administered 2023-02-16: 9 [IU] via SUBCUTANEOUS
  Administered 2023-02-16: 3 [IU] via SUBCUTANEOUS
  Administered 2023-02-17: 5 [IU] via SUBCUTANEOUS
  Administered 2023-02-17: 3 [IU] via SUBCUTANEOUS
  Administered 2023-02-18: 2 [IU] via SUBCUTANEOUS
  Administered 2023-02-18 – 2023-02-19 (×3): 1 [IU] via SUBCUTANEOUS
  Administered 2023-02-19: 2 [IU] via SUBCUTANEOUS
  Administered 2023-02-19: 7 [IU] via SUBCUTANEOUS
  Administered 2023-02-20: 2 [IU] via SUBCUTANEOUS
  Administered 2023-02-20: 3 [IU] via SUBCUTANEOUS
  Administered 2023-02-20 – 2023-02-21 (×2): 2 [IU] via SUBCUTANEOUS
  Administered 2023-02-21 (×2): 3 [IU] via SUBCUTANEOUS
  Administered 2023-02-22: 1 [IU] via SUBCUTANEOUS
  Administered 2023-02-22: 2 [IU] via SUBCUTANEOUS

## 2023-02-15 MED ORDER — MINOCYCLINE HCL 50 MG PO CAPS
200.0000 mg | ORAL_CAPSULE | Freq: Two times a day (BID) | ORAL | Status: DC
  Administered 2023-02-15 – 2023-02-22 (×14): 200 mg via ORAL
  Filled 2023-02-15 (×8): qty 4
  Filled 2023-02-15: qty 2
  Filled 2023-02-15 (×4): qty 4
  Filled 2023-02-15 (×2): qty 2
  Filled 2023-02-15 (×2): qty 4

## 2023-02-15 MED ORDER — HYDROXYZINE HCL 10 MG PO TABS
10.0000 mg | ORAL_TABLET | Freq: Three times a day (TID) | ORAL | Status: DC | PRN
  Administered 2023-02-17 – 2023-02-21 (×3): 10 mg via ORAL
  Filled 2023-02-15 (×5): qty 1

## 2023-02-15 MED ORDER — MINOCYCLINE HCL 100 MG PO CAPS: 100.0000 mg | ORAL_CAPSULE | Freq: Two times a day (BID) | ORAL | Status: DC

## 2023-02-15 NOTE — Consult Note (Signed)
NAME:  Rebecca Walter, MRN:  [id number], DOB:  Dec 25, 1940, LOS: 4 ADMISSION DATE:  02/11/2023, CONSULTATION DATE:  02/15/23 REFERRING MD:  FTPS , CHIEF COMPLAINT:  Multifocal PNA    History of Present Illness:  82 yo F PMH HFpEF, HTN, DM2 was admitted to [facility] 2/23 after presenting with DOE [id number] associated cough. She was admitted for tx of multifocal PNA +/- acute HF exacerbation. Initial abx azitho + rocephin. Was then found to have an acinetobacter bactermia, and [facility]  Despite abx and diuresis, pt SOB and hypoxia have persisted (requiring escalation of support to BiPAP), prompting CT chest 2/27 which demonstrated dense multifocal ASD bilaterally, and bilateral pleural effusions.   PCCM is consulted 2/27 in this setting   Pertinent  Medical History  HFpEF HTN HLD GERD Gout Hypothyroidism   Significant Hospital Events: Including procedures, antibiotic start and stop dates in addition to other pertinent events   2/23 admitted CAP 2/24 unasyn for acinetobacter bacteremia 2/27 CT chest with bad multifocal ASD. PCCM consult.   Interim History / Subjective:   On 9L   Objective   Blood pressure (!) 190/80, pulse 88, temperature 98 F (36.7 C), temperature source Axillary, resp. rate 20, height 5' (1.524 m), weight 93.2 kg, SpO2 (!) 84 %.    FiO2 (%):  [50 %] 50 %   Intake/Output Summary (Last 24 hours) at 02/15/2023 1228 Last data filed at 02/14/2023 2039 Gross per 24 hour  Intake 620.2 ml  Output 500 ml  Net 120.2 ml   Filed Weights   02/14/23 0355 02/15/23 0015 02/15/23 0319  Weight: 90 kg 92.5 kg 93.2 kg    Examination: General: elderly WDWN F  HENT: NCAT pink mm  Lungs: Rhonchi, shallow respirations, incr rate   Cardiovascular: rr  Abdomen: soft ndnt  Extremities: no acute deformity no edema  Neuro: AAOx3  GU: defer  Resolved Hospital Problem list     Assessment & Plan:   Acute respiratory failure with hypoxia Multifocal PNA,  suspect acinetobacter  Small bilateral pleural effusions Acinetobacter baumannii bacteremia Sepsis due to above  -has had progression of lung dz and no sx improvement despite unasyn (9g/d sulbactam component) -O2 req is incr  -effusions are small, not amenable to intervention right now.  -dont think that volume/HF is greatly contributing  P -agree with plan to engage ID for recs -- Clinical course raises my concern for possible sulbactam resistance +/- CRAB, but sensitivities from 2/23 are pansensitive -regardless, think adding mino is a good idea. I will reach out to ID pharm as I do not have much experience w this dosing  -mobility, pulm hygiene, IS, Flutter, mucinex  -wean O2 as able  -with mortality data re acinetobacter I did discuss GOC with her. She is DNR/I. Code status updated accordingly    Best Practice (right click and "Reselect all SmartList Selections" daily)   Per primary   Labs   CBC: Recent Labs  Lab 02/11/23 0420 02/11/23 0440 02/12/23 0206 02/13/23 0148 02/14/23 0146 02/15/23 0202  WBC 10.9*  --  10.4 11.3* 12.2* 8.8  NEUTROABS 8.3*  --   --   --   --  7.7  HGB 11.9* 11.6* 10.5* 10.4* 10.6* 10.4*  HCT 34.8* 34.0* 30.5* 31.2* 31.9* 31.5*  MCV 91.8  --  89.4 90.7 90.6 91.3  PLT 188  --  192 211 239 [id number]   Basic Metabolic Panel: Recent Labs  Lab 02/12/23 0206 02/13/23  0148 02/13/23 1456 02/14/23 0146 02/15/23 0202  NA 138 138 137 138 138  K 3.5 3.0* 3.8 4.1 4.1  CL 103 105 103 106 104  CO2 25 25 21* 24 24  GLUCOSE 163* 157* 316* 219* 281*  BUN '15 18 21 20 '$ 24*  CREATININE 0.90 0.88 0.92 1.01* 0.86  CALCIUM 8.4* 8.3* 8.8* 8.7* 8.6*  MG 1.8 2.0  --  2.1 2.2   GFR: Estimated Creatinine Clearance: 50.5 mL/min (by C-G formula based on SCr of 0.86 mg/dL). Recent Labs  Lab 02/11/23 0420 02/11/23 0627 02/12/23 0206 02/13/23 0148 02/14/23 0146 02/15/23 0202  WBC 10.9*  --  10.4 11.3* 12.2* 8.8  LATICACIDVEN 1.0 0.8  --   --   --   --      Liver Function Tests: Recent Labs  Lab 02/11/23 0420  AST 27  ALT 21  ALKPHOS 62  BILITOT 0.8  PROT 7.1  ALBUMIN 3.6   No results for input(s): "LIPASE", "AMYLASE" in the last 168 hours. No results for input(s): "AMMONIA" in the last 168 hours.  ABG    Component Value Date/Time   HCO3 21.8 02/11/2023 0440   TCO2 23 02/11/2023 0440   ACIDBASEDEF 4.0 (H) 02/11/2023 0440   O2SAT 97 02/11/2023 0440     Coagulation Profile: Recent Labs  Lab 02/11/23 0420  INR 1.1    Cardiac Enzymes: No results for input(s): "CKTOTAL", "CKMB", "CKMBINDEX", "TROPONINI" in the last 168 hours.  HbA1C: HbA1c, POC (controlled diabetic range)  Date/Time Value Ref Range Status  01/06/2023 11:39 AM 7.7 (A) 0.0 - 7.0 % Final  09/30/2022 02:28 PM 8.2 (A) 0.0 - 7.0 % Final    CBG: Recent Labs  Lab 02/14/23 1537 02/14/23 2234 02/15/23 0015 02/15/23 0741 02/15/23 1112  GLUCAP 158* 315* 274* 225* 170*    Review of Systems:   Review of Systems  Constitutional:  Positive for malaise/fatigue.  HENT: Negative.    Eyes: Negative.   Respiratory:  Positive for cough, sputum production and shortness of breath. Negative for hemoptysis.   Cardiovascular: Negative.   Gastrointestinal: Negative.   Genitourinary: Negative.   Musculoskeletal: Negative.   Skin: Negative.   Neurological: Negative.   Endo/Heme/Allergies: Negative.   Psychiatric/Behavioral:  The patient is nervous/anxious.      Past Medical History:  She,  has a past medical history of Acute pain of both knees (04/04/2022), Anxiety, Arthritis, Basal cell carcinoma (03/02/2021), Candidiasis of skin (02/14/2020), CARCINOMA, BASAL CELL (05/26/2007), Carpal tunnel syndrome of right wrist (11/26/2020), Cervical spinal stenosis (05/11/2022), Chronic diastolic CHF (congestive heart failure) ([place]), Diabetes mellitus, Diarrhea of presumed infectious origin (01/16/2021), DYSKINESIA, ESOPHAGUS (09/13/2007), Early satiety (09/04/2020),  Elevated TSH (11/21/2017), Epistaxis (02/04/2020), Generalized weakness (12/15/2018), GERD (gastroesophageal reflux disease), Gout, History of melanoma (01/31/2021), History of nuclear stress test, Hoarseness (11/27/2019), Hyperlipidemia, Hypertension, Hypertensive heart disease with CHF (congestive heart failure) ([place]), Hyponatremia, Leg fatigue (01/03/2021), Mild aortic stenosis (06/2017), Mild cognitive impairment (09/03/2022), Mild mitral regurgitation (06/2017), Moderate pulmonary valve insufficiency (06/2017), OBESITY, NOS (02/16/2007), Osteopenia, and Vertigo (08/29/2013).   Surgical History:   Past Surgical History:  Procedure Laterality Date   KIDNEY SURGERY     KNEE SURGERY     MENISECTOMY     OTHER SURGICAL HISTORY Left    Epiretinal Peel Dr Gershon Crane (Ophth)   WRIST SURGERY       Social History:   reports that she has never smoked. She has never used smokeless tobacco. She reports that she does not drink alcohol and  does not use drugs.   Family History:  Her family history includes Cancer in her brother and sister; Diabetes in her maternal uncle; Heart disease in her brother and father; Hypertension in her father; Stroke in her father and mother.   Allergies Allergies  Allergen Reactions   Acyclovir     Other reaction(s): diarrhea   Baclofen Other (See Comments)    Caused leg weakness   Hydrochlorothiazide Other (See Comments)    Uric acid elevation and gout.      Liraglutide Other (See Comments)    Tongue Glossitus   Losartan Potassium     Other reaction(s): diarrhea   Lotensin [Benazepril] Other (See Comments)    Dry cough    Benazepril Hcl     Other reaction(s): dry cough   Metoprolol Succinate [Metoprolol]     Other reaction(s): coughing   Beta Adrenergic Blockers Other (See Comments)    Occurred with metoprolol  coughing   Other Diarrhea and Other (See Comments)    Occurred with metoprolol  REACTION: dry coughing     Home Medications  Prior to Admission  medications   Medication Sig Start Date End Date Taking? Authorizing Provider  acetaminophen (TYLENOL) 325 MG tablet Take 325 mg by mouth every 8 (eight) hours as needed (pain).    Yes [provider]  allopurinol (ZYLOPRIM) 100 MG tablet Take 2 tablets (200 mg total) by mouth daily. Patient taking differently: Take 100 mg by mouth daily. 07/16/22  Yes [name], MD  amLODipine (NORVASC) 10 MG tablet Take 1 tablet (10 mg total) by mouth daily. 11/22/22  Yes [name], MD  amLODipine (NORVASC) 2.5 MG tablet Take 2.5 mg by mouth 2 (two) times daily as needed (HBP). 02/03/23  Yes [provider]  aspirin 81 MG chewable tablet Chew 1 tablet (81 mg total) by mouth daily. 03/22/11  Yes Cletus Gash, MD  azithromycin (ZITHROMAX) 250 MG tablet Take two tablets on the first day, followed by one tablet daily for four days for a total of five days of treatment. Patient taking differently: Take 250-500 mg by mouth See admin instructions. Take 500 mg (2 tablets) by mouth on day one, then take 250 mg (1 tablet) until finished. 02/10/23  Yes Eppie Gibson, MD  baclofen (LIORESAL) 10 MG tablet TAKE 0.5-1 TAB BY MOUTH 2 TIMES DAILY AS NEEDED FOR MUSCLE SPASMS. STOP USE IF DEVELOP LEG WEAKNESS. Patient taking differently: Take 5-10 mg by mouth 2 (two) times daily as needed for muscle spasms. 01/07/23  Yes [name], MD  busPIRone (BUSPAR) 15 MG tablet TAKE 1 TABLET BY MOUTH TWICE A DAY 10/18/22  Yes [name], MD  cefdinir (OMNICEF) 300 MG capsule Take 1 capsule (300 mg total) by mouth 2 (two) times daily. 02/10/23  Yes Eppie Gibson, MD  hydrocortisone cream (PREPARATION H) 1 % Apply 1 Application topically 2 (two) times daily. 12/07/22  Yes Lilland, Alana, DO  ibuprofen (ADVIL) 200 MG tablet Take 200 mg by mouth daily as needed for mild pain or moderate pain.   Yes [provider]  insulin aspart (FIASP) 100 UNIT/ML FlexTouch Pen INJECT 15 UNITS SUBCUTANEOUSLY  DAILY WITH BREAKFAST, INJECT 9 UNITS WITH LUNCH AS NEEDED FOR CBG 180 OR MORE, AND INJECT 9 UNITS WITH SUPPER Patient taking differently: Inject 0-15 Units into the skin 3 (three) times daily as needed ([place]). Per sliding scale. 01/05/23  Yes [name], MD  insulin glargine (LANTUS SOLOSTAR) 100 UNIT/ML Solostar Pen Inject 64 Units  into the skin daily. 03/30/22  Yes [name], MD  levothyroxine (SYNTHROID) 50 MCG tablet Take 1 tablet (50 mcg total) by mouth every morning. 30 minutes before food 10/18/22  Yes [name], MD  lidocaine (XYLOCAINE) 2 % solution Use as directed 15 mLs in the mouth or throat 2 (two) times daily as needed for mouth pain.   Yes [provider]  loperamide (IMODIUM A-D) 2 MG tablet Take 4 mg by mouth as needed for diarrhea or loose stools.   Yes [provider]  metoCLOPramide (REGLAN) 5 MG tablet 1 TABLET BEFORE MEALS Patient taking differently: Take 5 mg by mouth 2 (two) times daily. 01/31/23  Yes [name], MD  Multiple Vitamin (MULTI VITAMIN) TABS Take 1 tablet by mouth daily.   Yes [provider]  OVER THE COUNTER MEDICATION Take 1 tablet by mouth daily. [facility] Allergy Relief.   Yes [provider]  pantoprazole (PROTONIX) 40 MG tablet TAKE 1 TABLET BY MOUTH TWICE A DAY 12/31/22  Yes [name], MD  polyethylene glycol powder (GLYCOLAX/MIRALAX) 17 GM/SCOOP powder Take 17 g by mouth daily. Patient taking differently: Take 17 g by mouth daily as needed for mild constipation or moderate constipation. 12/03/22  Yes [name], MD  sertraline (ZOLOFT) 100 MG tablet Take 1 tablet (100 mg total) by mouth daily. Patient taking differently: Take 100 mg by mouth in the morning. Take along with 50 mg twice daily for a total daily dose of '200mg'$ . 11/22/22  Yes [name], MD  sertraline (ZOLOFT) 50 MG tablet TAKE 1 TABLET IN THE AFTERNOON, ONE DOSE AT BEDTIME Patient taking differently: Take 50 mg by mouth 2 (two)  times daily. At lunch and at bedtime. Take along with 100 mg tablet for a total daily dose of 200 mg. 11/22/22  Yes [name], MD  spironolactone (ALDACTONE) 25 MG tablet TAKE 1 TABLET BY MOUTH EVERY DAY Patient taking differently: Take 25 mg by mouth daily. 10/22/22  Yes [name], MD  torsemide (DEMADEX) 10 MG tablet TAKE 2 TABLETS (20 MG TOTAL) BY MOUTH DAILY AS NEEDED (EDEMA, >3LB WEIGHT GAIN). Patient taking differently: Take 10 mg by mouth daily. 10/18/22  Yes [name], MD  triamcinolone ointment (KENALOG) 0.5 % Apply 1 Application topically 2 (two) times daily. 12/07/22  Yes Lilland, Alana, DO  valsartan (DIOVAN) 320 MG tablet TAKE 1 TABLET BY MOUTH EVERY DAY Patient taking differently: Take 320 mg by mouth daily. 10/18/22  Yes [name], MD  Vitamin E 400 units TABS Take 400 Units by mouth daily.   Yes [provider]  diclofenac Sodium (VOLTAREN) 1 % GEL Apply 2 g topically 3 (three) times daily. To help with knee pain. Patient not taking: Reported on 02/11/2023 03/31/22   [name], MD  famotidine (PEPCID) 20 MG tablet TAKE 1 TABLET BY MOUTH TWICE A DAY Patient not taking: Reported on 02/11/2023 04/08/22   [name], MD  lovastatin (MEVACOR) 40 MG tablet TAKE 1 TABLET BY MOUTH EVERYDAY AT BEDTIME 02/15/23   [name], MD  OneTouch Delica Lancets [id number] MISC Please use to check blood sugar up to 4 times daily. E11.9 08/15/22   [name], MD     Critical care time: n/a     Eliseo Gum MSN, AGACNP-BC [place] for pager  02/15/2023, 1:50 PM

## 2023-02-15 NOTE — Progress Notes (Signed)
Pt placed on BIPAP per pt request. Pt has been maintaining O2 sats 92-96 % on 10L HFNC some labored breathing noted with minimal activity, pt requested BIPAP for comfort while sleeping. Will continue to monitor.

## 2023-02-15 NOTE — Consult Note (Signed)
[facility]    Date of Admission:  02/11/2023     Reason for Consult: Pneumonia     Referring Physician: Family medicine teaching service  Current antibiotics: Ampicillin sulbactam 2/24--present  Previous antibiotics: Ceftriaxone x 1 dose 2/23 Azithromycin x 1 dose 2/23  ASSESSMENT:    82 y.o. female admitted with:  Multifocal pneumonia with hypoxemic respiratory failure and secondary Acinetobacter bacteremia: Repeat imaging today with CT chest showed dense airspace disease in the right upper, middle, and lower lobe with some collapse and consolidation also noted in the left lower lobe.  There was also a small right pleural effusion that is currently not amenable to drainage and the risk of procedure likely outweighs any benefit per CCM.  Rebecca Walter has not had much improvement on current antimicrobial therapy as well as diuresis.  Currently on high flow nasal cannula. Acinetobacter bacteremia: Isolate noted to be pansensitive and suspected secondary to #1. Type 2 diabetes: Blood sugars have been elevated with getting steroids.  A1c pending. HFpEF: Repeat TTE this admission with no significant changes and Rebecca Walter is net negative almost 1 L. Chronic kidney disease stage III: Creatinine clearance at baseline.  RECOMMENDATIONS:    Continue ampicillin sulbactam for targeted coverage of her Acinetobacter isolate.  Will add second agent in the setting of her severe infection with minocycline 200 mg twice daily Sputum cultures are are pending which may help guide therapy if another organism grows as well Minocycline will also provide MRSA coverage in the interim pending her MRSA nares PCR Check respiratory pathogen panel Query the possibility of aspiration induced worsening of her pulmonary status.  Will order SLP evaluation Monitor pleural effusion closely to determine if any need for sampling with thoracentesis will be needed Following   Principal Problem:   Acute  hypoxemic respiratory failure (HCC) Active Problems:   Diabetes mellitus, type II (HCC)   HFpEF 70-75%   Pneumonia of both lower lobes due to infectious organism   SOB (shortness of breath)   Bacteremia   Multifocal pneumonia   MEDICATIONS:    Scheduled Meds:  allopurinol  100 mg Oral Daily   amLODipine  10 mg Oral Daily   busPIRone  15 mg Oral BID   diclofenac Sodium  4 g Topical QID   enoxaparin (LOVENOX) injection  40 mg Subcutaneous Q24H   guaiFENesin  600 mg Oral BID   insulin aspart  0-5 Units Subcutaneous QHS   insulin aspart  0-9 Units Subcutaneous TID WC   insulin glargine-yfgn  30 Units Subcutaneous Daily   irbesartan  300 mg Oral Daily   lactose free nutrition  237 mL Oral BID BM   levothyroxine  50 mcg Oral Daily   methylPREDNISolone (SOLU-MEDROL) injection  40 mg Intravenous Daily   metoCLOPramide  5 mg Oral TID AC   multivitamin with minerals  1 tablet Oral Daily   mouth rinse  15 mL Mouth Rinse 4 times per day   pantoprazole  40 mg Oral BID   polyethylene glycol  17 g Oral Daily   pravastatin  40 mg Oral q1800   sertraline  100 mg Oral Daily   sertraline  50 mg Oral 2 times per day   spironolactone  25 mg Oral Daily   Continuous Infusions:  ampicillin-sulbactam (UNASYN) IV 3 g (02/15/23 1226)   PRN Meds:.hydrOXYzine, mouth rinse  HPI:    Rebecca Walter is a 82 y.o. female with a past medical history of type 2 diabetes,  hyperlipidemia, CKD, hypertension, diabetic neuropathy, HFpEF who presented 02/11/2023 with productive cough and shortness of breath.  Rebecca Walter was found to be hypoxemic with evidence of bilateral airspace disease on chest x-ray.  Rebecca Walter initially required BiPAP in the emergency department and was started on empiric coverage for community-acquired pneumonia.  Her initial testing for COVID-19 and influenza was negative.  Rebecca Walter had no evidence of lactic acidosis.  Her BNP was 277.  Blood cultures were obtained which grew Acinetobacter in 1 out of of 4  bottles.  Her antibiotics were narrowed to ampicillin sulbactam.  Rebecca Walter initially seemed to respond appropriately to antimicrobial therapy.  Rebecca Walter has been afebrile.  Her leukocytosis has normalized.  Rebecca Walter is typically not on oxygen at home, however, Rebecca Walter has required supplemental oxygen during this admission.  Rebecca Walter is currently on high flow nasal cannula.  A CT of the chest was obtained today as her improvement has been slow.  This showed dense airspace disease in the posterior right upper lobe, central right middle lobe, and posterior right lower lobe this was felt to be consistent with multifocal pneumonia.  There was a small to moderate right and left pleural effusion.  Her son is present in the room at this time.  He states that the day prior, Rebecca Walter was up to the chair and seemed to be improving.  He left that evening and when he returned the next day Rebecca Walter seemed to have taken a couple steps backward in terms of her breathing and oxygenation status.  We have been consulted for antibiotic recommendations.  Pulmonary has also been consulted.  In addition to antibiotics, patient has also been getting diuresed.  Rebecca Walter had a TTE on admission that showed an LV EF of 70-75 % with grade 2 diastolic dysfunction.  Rebecca Walter is currently not at negative approximately 869 mL.  Rebecca Walter reports the lower extremity swelling on admission has improved.   Past Medical History:  Diagnosis Date   Acute pain of both knees 04/04/2022   Anxiety    Arthritis    Basal cell carcinoma 03/02/2021   Candidiasis of skin 02/14/2020   CARCINOMA, BASAL CELL 05/26/2007   Qualifier: History of  By: Barbra Sarks MD, Rachel     Carpal tunnel syndrome of right wrist 11/26/2020   Cervical spinal stenosis 05/11/2022   MRI Brain (05/11/22) shows moderate cervical spinal stenosis   Chronic diastolic CHF (congestive heart failure) ([place])    Echocardiogram 01/2020: EF 70-75, no RWMA, Gr 1 DD, normal RVSF, RVSP 45.8, mild to mod LAE, trivial MR, mild PI   Diabetes  mellitus    Diarrhea of presumed infectious origin 01/16/2021   DYSKINESIA, ESOPHAGUS 09/13/2007   Qualifier: Diagnosis of  By: Brigitte Pulse MD, Kimberlee     Early satiety 09/04/2020   Elevated TSH 11/21/2017   Epistaxis 02/04/2020   Generalized weakness 12/15/2018   GERD (gastroesophageal reflux disease)    Gout    History of melanoma 01/31/2021   History of nuclear stress test    Myoview 06/2020: EF 75, no ischemia or infarction, low risk   Hoarseness 11/27/2019   Hyperlipidemia    Hypertension    Hypertensive heart disease with CHF (congestive heart failure) (HCC)    Hyponatremia    Leg fatigue 01/03/2021   Mild aortic stenosis 06/2017   Mild cognitive impairment 09/03/2022   Mild mitral regurgitation 06/2017   Moderate pulmonary valve insufficiency 06/2017   OBESITY, NOS 02/16/2007   Qualifier: Diagnosis of  By: Drucie Ip  Osteopenia    Vertigo 08/29/2013    Social History   Tobacco Use   Smoking status: Never   Smokeless tobacco: Never  Vaping Use   Vaping Use: Never used  Substance Use Topics   Alcohol use: No   Drug use: No    Family History  Problem Relation Age of Onset   Stroke Mother    Heart disease Father    Stroke Father    Hypertension Father    Cancer Sister    Cancer Brother    Heart disease Brother    Diabetes Maternal Uncle     Allergies  Allergen Reactions   Acyclovir     Other reaction(s): diarrhea   Baclofen Other (See Comments)    Caused leg weakness   Hydrochlorothiazide Other (See Comments)    Uric acid elevation and gout.      Liraglutide Other (See Comments)    Tongue Glossitus   Losartan Potassium     Other reaction(s): diarrhea   Lotensin [Benazepril] Other (See Comments)    Dry cough    Benazepril Hcl     Other reaction(s): dry cough   Metoprolol Succinate [Metoprolol]     Other reaction(s): coughing   Beta Adrenergic Blockers Other (See Comments)    Occurred with metoprolol  coughing   Other Diarrhea and Other  (See Comments)    Occurred with metoprolol  REACTION: dry coughing    Review of Systems  All other systems reviewed and are negative.  Except as otherwise noted above in the HPI. OBJECTIVE:   Blood pressure (!) 190/80, pulse 88, temperature 98 F (36.7 C), temperature source Axillary, resp. rate 20, height 5' (1.524 m), weight 93.2 kg, SpO2 (!) 84 %. Body mass index is 40.13 kg/m.  Physical Exam Constitutional:      Comments: Elderly woman, lying in bed, appears dyspneic with conversation  HENT:     Head: Normocephalic and atraumatic.  Eyes:     Extraocular Movements: Extraocular movements intact.     Conjunctiva/sclera: Conjunctivae normal.  Pulmonary:     Comments: Mildly increased work of breathing currently on high flow nasal cannula Abdominal:     General: There is no distension.     Palpations: Abdomen is soft.  Musculoskeletal:     Cervical back: Normal range of motion and neck supple.     Comments: Trace lower extremity edema  Skin:    General: Skin is warm and dry.     Findings: No rash.  Neurological:     General: No focal deficit present.     Mental Status: Rebecca Walter is oriented to person, place, and time.  Psychiatric:        Mood and Affect: Mood normal.        Behavior: Behavior normal.      Lab Results: Lab Results  Component Value Date   WBC 8.8 02/15/2023   HGB 10.4 (L) 02/15/2023   HCT 31.5 (L) 02/15/2023   MCV 91.3 02/15/2023   PLT 213 02/15/2023    Lab Results  Component Value Date   NA 138 02/15/2023   K 4.1 02/15/2023   CO2 24 02/15/2023   GLUCOSE 281 (H) 02/15/2023   BUN 24 (H) 02/15/2023   CREATININE 0.86 02/15/2023   CALCIUM 8.6 (L) 02/15/2023   GFRNONAA >60 02/15/2023   GFRAA 58 (L) 10/17/2020    Lab Results  Component Value Date   ALT 21 02/11/2023   AST 27 02/11/2023   ALKPHOS 62 02/11/2023  BILITOT 0.8 02/11/2023       Component Value Date/Time   CRP 0.8 (H) 12/02/2014 1438    No results found for:  "ESRSEDRATE"  I have reviewed the micro and lab results in Epic.  Imaging: CT CHEST W CONTRAST  Result Date: 02/15/2023 CLINICAL DATA:  Pneumonia. EXAM: CT CHEST WITH CONTRAST TECHNIQUE: Multidetector CT imaging of the chest was performed during intravenous contrast administration. RADIATION DOSE REDUCTION: This exam was performed according to the departmental dose-optimization program which includes automated exposure control, adjustment of the mA and/or kV according to patient size and/or use of iterative reconstruction technique. CONTRAST:  48m OMNIPAQUE IOHEXOL 350 MG/ML SOLN COMPARISON:  Chest x-ray 02/14/2023. FINDINGS: Cardiovascular: Heart size upper normal. No substantial pericardial effusion. Coronary artery calcification is evident. Mild atherosclerotic calcification is noted in the wall of the thoracic aorta. Enlargement of the pulmonary outflow tract/main pulmonary arteries suggests pulmonary arterial hypertension. Mediastinum/Nodes: No mediastinal lymphadenopathy. There is no hilar lymphadenopathy. The esophagus has normal imaging features. There is no axillary lymphadenopathy. Lungs/Pleura: Scattered areas of septal thickening with patchy/nodular ground-glass opacity identified in both upper lobes. There is dense consolidative airspace disease in the posterior right upper lobe, central right middle lobe and posterior right lower lobe. Collapse/consolidation noted left lower lobe is well. Small to moderate right and tiny left pleural effusions evident. Upper Abdomen: Visualized portion of the upper abdomen is unremarkable. Musculoskeletal: No worrisome lytic or sclerotic osseous abnormality. Degenerative changes noted in the shoulders bilaterally. IMPRESSION: 1. Dense consolidative airspace disease in the posterior right upper lobe, central right middle lobe and posterior right lower lobe. Collapse/consolidation noted left lower lobe. Imaging features are compatible with multifocal pneumonia.  2. Small to moderate right and tiny left pleural effusions. 3. Enlargement of the pulmonary outflow tract/main pulmonary arteries suggests pulmonary arterial hypertension. 4.  Aortic Atherosclerosis (ICD10-I70.0). Electronically Signed   By: EMisty StanleyM.D.   On: 02/15/2023 10:29   DG CHEST PORT 1 VIEW  Result Date: 02/14/2023 CLINICAL DATA:  Hypoxia EXAM: PORTABLE CHEST 1 VIEW COMPARISON:  02/11/2023 FINDINGS: Rotated AP portable examination. Gross cardiomegaly. Dense heterogeneous airspace and consolidation of the bilateral lung bases, with increased airspace opacity and consolidation of the peripheral right upper lobe. Osseous structures unremarkable. IMPRESSION: 1. Dense heterogeneous airspace and consolidation of the bilateral lung bases, with increased airspace opacity and consolidation of the peripheral right upper lobe. Findings are consistent with worsened multifocal infection. 2. Gross cardiomegaly. Electronically Signed   By: ADelanna AhmadiM.D.   On: 02/14/2023 16:06     Imaging independently reviewed in Epic.  ARaynelle Highlandfor Infectious Disease [facility] [phone] 02/15/2023, 2:13 PM

## 2023-02-15 NOTE — Progress Notes (Signed)
Physical Therapy Treatment Patient Details Name: Rebecca Walter MRN: [id number] DOB: [birth year] Today's Date: 02/15/2023   History of Present Illness Rebecca Walter is an 82 y.o. female presenting with shortness of breath, found to have CHF exacerbation and PNA. PMH significant for HFpEF, HTN, HLD, T2DM, GERD, Gout    PT Comments    Pt tolerated treatment well today. Pt with similar presentation to previous session. Pt was able to transfer from bed to chair with +2 assist for safety/equipment. Once standing pt moves fairly well however continues to be very limited by decreased activity tolerance. No change in DC recommendations. Pt would benefit from continued functional mobility training during acute stay.   Recommendations for follow up therapy are one component of a multi-disciplinary discharge planning process, led by the attending physician.  Recommendations may be updated based on patient status, additional functional criteria and insurance authorization.  Follow Up Recommendations  Skilled nursing-short term rehab (<3 hours/day) Can patient physically be transported by private vehicle: Yes   Assistance Recommended at Discharge Frequent or constant Supervision/Assistance  Patient can return home with the following A little help with walking and/or transfers;A little help with bathing/dressing/bathroom   Equipment Recommendations  None recommended by PT (Per accepting facility)    Recommendations for Other Services       Precautions / Restrictions Precautions Precautions: Fall Precaution Comments: watch SpO2 Restrictions Weight Bearing Restrictions: No     Mobility  Bed Mobility Overal bed mobility: Needs Assistance Bed Mobility: Supine to Sit     Supine to sit: Mod assist     General bed mobility comments: Assistance for trunk elevation and scooting EOB    Transfers Overall transfer level: Needs assistance Equipment used: Rolling walker (2 wheels) Transfers: Sit  to/from Stand, Bed to chair/wheelchair/BSC Sit to Stand: Min assist, +2 safety/equipment   Step pivot transfers: Min guard, +2 safety/equipment       General transfer comment: assist for initial rise from EOB, transition to min guard for safety for second attempt from recliner.    Ambulation/Gait Ambulation/Gait assistance: Min guard Gait Distance (Feet): 5 Feet Assistive device: Rolling walker (2 wheels) Gait Pattern/deviations: Step-through pattern, Decreased stride length, Trunk flexed Gait velocity: decr         Stairs             Wheelchair Mobility    Modified Rankin (Stroke Patients Only)       Balance Overall balance assessment: Mild deficits observed, not formally tested                                          Cognition Arousal/Alertness: Awake/alert Behavior During Therapy: WFL for tasks assessed/performed Overall Cognitive Status: Within Functional Limits for tasks assessed                                          Exercises      General Comments General comments (skin integrity, edema, etc.): Pt received on 11L of O2 satting at 93%. Pt briefly dropped to 84% on 11L during transfer however quickly recovered to 93% with pursed lip breathing.      Pertinent Vitals/Pain Pain Assessment Pain Assessment: No/denies pain    Home Living  Prior Function            PT Goals (current goals can now be found in the care plan section) Progress towards PT goals: Progressing toward goals    Frequency    Min 2X/week      PT Plan Current plan remains appropriate    Co-evaluation              AM-PAC PT "6 Clicks" Mobility   Outcome Measure  Help needed turning from your back to your side while in a flat bed without using bedrails?: A Little Help needed moving from lying on your back to sitting on the side of a flat bed without using bedrails?: A Lot Help needed  moving to and from a bed to a chair (including a wheelchair)?: A Lot Help needed standing up from a chair using your arms (e.g., wheelchair or bedside chair)?: A Little Help needed to walk in hospital room?: A Lot Help needed climbing 3-5 steps with a railing? : Total 6 Click Score: 13    End of Session Equipment Utilized During Treatment: Oxygen;Gait belt Activity Tolerance: Patient tolerated treatment well Patient left: in chair;with call bell/phone within reach;with chair alarm set;with family/visitor present;with nursing/sitter in room Nurse Communication: Mobility status PT Visit Diagnosis: Other abnormalities of gait and mobility (R26.89);Muscle weakness (generalized) (M62.81)     Time: [id number] PT Time Calculation (min) (ACUTE ONLY): 35 min  Charges:  $Therapeutic Activity: 23-37 mins                     Shelby Mattocks, PT, DPT Acute Rehab Services [id number]    Viann Shove 02/15/2023, 4:03 PM

## 2023-02-15 NOTE — IPAL (Signed)
  Interdisciplinary Goals of Care Family Meeting   Date carried out:: 02/15/2023  Location of the meeting: Bedside  Member's involved: Physician, Bedside Registered Nurse, and Family Member or next of kin  Durable Power of Attorney or acting medical decision maker: Patient Rebecca Walter    Discussion: We discussed goals of care for Rebecca Walter .  We discussed the fact that she has severe pneumonia from Acinetobacter baumannii and bacteremia and is generally very weak. Explained to the patient and her son that this is a very severe infection and unfortunately is worsening.  Given her advanced age and multiple comorbid illnesses and significant physical deconditioning we talked quite frankly about the inability of mechanical ventilation to guarantee a positive outcome if she were to develop respiratory failure.  The patient and her son voiced understanding.  She stated that she wished for her CODE STATUS to be DO NOT RESUSCITATE, no mechanical ventilation.  Code status: Full DNR  Disposition: Continue current acute care   Time spent for the meeting: 15 minutes  Roselie Awkward 02/15/2023, 2:38 PM

## 2023-02-15 NOTE — Progress Notes (Signed)
Spoke with on-call CCM provider for non-urgent Pulm consult. They will see patient. We greatly appreciate their input on this case.

## 2023-02-15 NOTE — Progress Notes (Signed)
FMTS Brief Progress Note  S: Patient endorses improved respiratory effort, she states she used her incentive spirometer today.  O: BP (!) 154/67 (BP Location: Left Wrist)   Pulse 72   Temp 98.7 F (37.1 C) (Oral)   Resp 19   Ht 5' (1.524 m)   Wt 93.2 kg   LMP  (LMP Unknown)   SpO2 94%   BMI 40.13 kg/m   General: NAD, resting comfortably in bed CV: RRR, no murmurs auscultated Respiratory: Coarse breath sounds throughout, continued transmitted upper airway noises related to congestion, HFNC in place on 11 L satting in lower 90s Extremities: No edema in BLEs  A/P: Acute hypoxemic respiratory failure Continue suspicion of bilateral pneumonia with Unasyn coverage and now minocycline (per CCM recommendations) although recent viral panel notes coronavirus OC 43 positive.  Nasal MRSA swab negative therefore not requiring further coverage. -Continue plan per day team and CCM, she will restart BiPAP for the nights - Orders reviewed. Labs for AM ordered, which was adjusted as needed.   Wells Guiles, DO 02/15/2023, 11:08 PM PGY-2, [facility] Night Resident  Please page [phone] with questions.

## 2023-02-15 NOTE — Progress Notes (Addendum)
Daily Progress Note Intern Pager: [phone]  Patient name: Rebecca Walter record number: [id number] Date of birth: 01/03/1941 Age: 82 y.o. Gender: female  Primary Care Provider: [name], MD Consultants: ID Code Status: Full code  Pt Overview and Major Events to Date:  2/23: Admitted  Assessment and Plan: Rebecca Walter is a 82 year old female presenting with shortness of breath, found to have CHF exacerbation and pneumonia.  Patient now euvolemic, without evidence of CHF exacerbation.  However respiratory status remains tenuous, concern for worsening pneumonia.  Differential and plan as below.  * Acute hypoxemic respiratory failure ([place]) Primarily suspect AHRF 2/2 bilateral pneumonia.  Currently on BiPAP, with saturations between 94-98% while in room.  Pending CT chest with contrast.  If patient's respiratory status does not improve, or concerning findings on CT chest-will consider pulmonology consult. -Continue Unasyn (2/24-) -BiPAP  Bacteremia Actinetobacter calcoaceticus/baumannii on blood culture.  Spoke with ID, who recommended continuing Unasyn.  Total antibiotic course of 7-10 days (start date of 2/24) pending clinical response.  Reconsult ID for stepdown therapy once patient has improvement in respiratory symptoms. -Ceftriaxone and azithromycin (2/23) -Unasyn 3 g every 6 hours (2/24-)  Pneumonia of both lower lobes due to infectious organism Initially started on ceftriaxone azithromycin, discontinue on 2/23.  Started on Unasyn 2/24 for bacteremia as above. WBC today 8.8, down from 12.2 yesterday. On BiPAP, normal work of breathing, satting between 94-98%.  Repeat CXR yesterday concerning for progressing pneumonia.  Pending CT chest with contrast to better characterize disease process. - Monitor respiratory status closely - Incentive spirometry - Continue antibiotic therapy as above  HFpEF 70-75% Approximately net -800 mL.  Weight 93.2 kg, up from 90 kg  yesterday.  BLE resovled.  Close to euvolemic on exam, question fluid retention in chest/abdomen-consider repeat diuresis. -Continue home spironolactone '25mg'$  daily -Strict I/Os -Daily weights -Daily BMP, Mg  Diabetes mellitus, type II ([place]) Started on Solu-Medrol yesterday for respiratory status.  Patient had increased blood sugar, particularly at nighttime.  Will consider adding nighttime coverage.  Currently on 30 units Semglee daily with sensitive sliding scale mealtime coverage. -CBG monitoring qAC and qHS   FEN/GI: Dysphagia 3 PPx: Lovenox Dispo: SNF, pending clinical improvement  Subjective:  Patient reports significant improvement in respiratory symptoms while on BiPAP.  Objective: Temp:  [97.3 F (36.3 C)-99.6 F (37.6 C)] 98.1 F (36.7 C) (02/27 0734) Pulse Rate:  [65-93] 79 (02/27 0741) Resp:  [18-30] 23 (02/27 0741) BP: (156-183)/(69-84) 157/84 (02/27 0734) SpO2:  [86 %-98 %] 96 % (02/27 0741) FiO2 (%):  [50 %] 50 % (02/27 0741) Weight:  [92.5 kg-93.2 kg] 93.2 kg (02/27 0319) Physical Exam: General: NAD, chronically ill-appearing, resting comfortably in bed Cardiovascular: RRR, no murmurs Respiratory: Coarse breath sounds bilaterally, referred upper airway noises while on BiPAP.  Normal work of breathing on BiPAP with oxygen saturations greater than 95% Abdomen: Soft, not tender, not distended.  Bowel sounds present Extremities: No edema, moves all 4 extremities  Laboratory: Most recent CBC Lab Results  Component Value Date   WBC 8.8 02/15/2023   HGB 10.4 (L) 02/15/2023   HCT 31.5 (L) 02/15/2023   MCV 91.3 02/15/2023   PLT 213 02/15/2023   Most recent BMP    Latest Ref Rng & Units 02/15/2023    2:02 AM  BMP  Glucose 70 - 99 mg/dL 281   BUN 8 - 23 mg/dL 24   Creatinine 0.44 - 1.00 mg/dL 0.86   Sodium 135 -  145 mmol/L 138   Potassium 3.5 - 5.1 mmol/L 4.1   Chloride 98 - 111 mmol/L 104   CO2 22 - 32 mmol/L 24   Calcium 8.9 - 10.3 mg/dL 8.6      Leslie Dales, DO 02/15/2023, 9:19 AM  PGY-1, [place] Intern pager: [phone], text pages welcome Secure chat group [place]

## 2023-02-15 NOTE — Progress Notes (Signed)
Spoke with on-call ID provider for nonurgent consult.  They will see patient.  We greatly appreciate their input in this case.

## 2023-02-15 NOTE — Inpatient Diabetes Management (Signed)
Inpatient Diabetes Program Recommendations  AACE/ADA: New Consensus Statement on Inpatient Glycemic Control (2015)  Target Ranges:  Prepandial:   less than 140 mg/dL      Peak postprandial:   less than 180 mg/dL (1-2 hours)      Critically ill patients:  140 - 180 mg/dL   Lab Results  Component Value Date   GLUCAP 274 (H) 02/15/2023   HGBA1C 7.7 (A) 01/06/2023    Review of Glycemic Control  Latest Reference Range & Units 02/14/23 07:31 02/14/23 12:20 02/14/23 15:37 02/14/23 22:34 02/15/23 00:15  Glucose-Capillary 70 - 99 mg/dL 182 (H)  Novolog 2 units 172 (H)  Novolog 2 units 158 (H)  Novolog 2 units 315 (H)  Novolog 3 units 274 (H)   Diabetes history: DM 2 Outpatient Diabetes medications:  Novolog 0-15 units tid, Lantus 64 units Daily Current orders for Inpatient glycemic control:  Semglee 30 units Daily Novolog 0-9 units tid  A1c 7.7% on 1/18 Solumedrol 40 mg Daily  Inpatient Diabetes Program Recommendations:    -  Consider adding Novolog 2-3 units tid meal coverage with parameters (pt to eat at least 50% of meals) to prevent postprandial spikes -  Consider adding Novolog hs scale  Thanks,  Tama Headings RN, MSN, BC-ADM Inpatient Diabetes Coordinator Team Pager [phone] (8a-5p)

## 2023-02-15 NOTE — Progress Notes (Signed)
Patient was transported to CT & back to room 6E02 on the Bipap with no problems.

## 2023-02-15 NOTE — TOC Progression Note (Addendum)
Transition of Care [facility]) - Progression Note    Patient Details  Name: Rebecca Walter MRN: [id number] Date of Birth: 1941/09/04  Transition of Care [facility]) CM/SW [place], [place] Phone Number: 02/15/2023, 10:24 AM  Clinical Narrative:     CSW received call back from patients son [name] who is also in agreement for SNF placement for patient when  patient medically ready for dc. [name] gave CSW permission to fax out initial referral near the [place] area for possible SNF placement. CSW informed [name] CSW following to fax patient out closer to patient being medically ready for dc. CSW discussed insurance authorization process. All questions answered.No further questions reported at this time.Patients passr currently pending.  CSW submitted clinicals requested by [place] [facility]. CSW will continue to follow and assist with patients dc planning needs.   Patients passr was approved and added to patients FL2.    Expected Discharge Plan and Services                                               Social Determinants of Health (SDOH) Interventions SDOH Screenings   Food Insecurity: No Food Insecurity (02/13/2023)  Housing: Low Risk  (02/13/2023)  Transportation Needs: No Transportation Needs (02/13/2023)  Utilities: Not At Risk (02/13/2023)  Alcohol Screen: Low Risk  (11/17/2021)  Depression (PHQ2-9): Low Risk  (02/10/2023)  Financial Resource Strain: Low Risk  (11/17/2021)  Physical Activity: Inactive (11/17/2021)  Social Connections: Moderately Integrated (11/17/2021)  Stress: No Stress Concern Present (11/17/2021)  Tobacco Use: Low Risk  (02/11/2023)    Readmission Risk Interventions     No data to display

## 2023-02-16 DIAGNOSIS — J189 Pneumonia, unspecified organism: Secondary | ICD-10-CM | POA: Diagnosis not present

## 2023-02-16 DIAGNOSIS — J9601 Acute respiratory failure with hypoxia: Secondary | ICD-10-CM | POA: Diagnosis not present

## 2023-02-16 DIAGNOSIS — R6520 Severe sepsis without septic shock: Secondary | ICD-10-CM

## 2023-02-16 DIAGNOSIS — R7881 Bacteremia: Secondary | ICD-10-CM | POA: Diagnosis not present

## 2023-02-16 DIAGNOSIS — B342 Coronavirus infection, unspecified: Secondary | ICD-10-CM

## 2023-02-16 HISTORY — DX: Coronavirus infection, unspecified: B34.2

## 2023-02-16 HISTORY — DX: Severe sepsis without septic shock: R65.20

## 2023-02-16 HISTORY — DX: Acute respiratory failure with hypoxia: J96.01

## 2023-02-16 LAB — GLUCOSE, CAPILLARY
Glucose-Capillary: 178 mg/dL — ABNORMAL HIGH (ref 70–99)
Glucose-Capillary: 218 mg/dL — ABNORMAL HIGH (ref 70–99)
Glucose-Capillary: 420 mg/dL — ABNORMAL HIGH (ref 70–99)
Glucose-Capillary: 386 mg/dL — ABNORMAL HIGH (ref 70–99)
Glucose-Capillary: 342 mg/dL — ABNORMAL HIGH (ref 70–99)

## 2023-02-16 LAB — BASIC METABOLIC PANEL
Anion gap: 8 (ref 5–15)
BUN: 34 mg/dL — ABNORMAL HIGH (ref 8–23)
CO2: 27 mmol/L (ref 22–32)
Calcium: 8.8 mg/dL — ABNORMAL LOW (ref 8.9–10.3)
Chloride: 106 mmol/L (ref 98–111)
Creatinine, Ser: 1.02 mg/dL — ABNORMAL HIGH (ref 0.44–1.00)
GFR, Estimated: 55 mL/min — ABNORMAL LOW (ref >60)
Glucose, Bld: 209 mg/dL — ABNORMAL HIGH (ref 70–99)
Potassium: 4 mmol/L (ref 3.5–5.1)
Sodium: 141 mmol/L (ref 135–145)

## 2023-02-16 LAB — CBC WITH DIFFERENTIAL/PLATELET
Abs Immature Granulocytes: 0.1 10*3/uL — ABNORMAL HIGH (ref 0.00–0.07)
Basophils Absolute: 0 10*3/uL (ref 0.0–0.1)
Basophils Relative: 0 %
Eosinophils Absolute: 0.1 10*3/uL (ref 0.0–0.5)
Eosinophils Relative: 1 %
HCT: 30.7 % — ABNORMAL LOW (ref 36.0–46.0)
Hemoglobin: 10.4 g/dL — ABNORMAL LOW (ref 12.0–15.0)
Immature Granulocytes: 1 %
Lymphocytes Relative: 11 %
Lymphs Abs: 1.3 10*3/uL (ref 0.7–4.0)
MCH: 30.7 pg (ref 26.0–34.0)
MCHC: 33.9 g/dL (ref 30.0–36.0)
MCV: 90.6 fL (ref 80.0–100.0)
Monocytes Absolute: 1.3 10*3/uL — ABNORMAL HIGH (ref 0.1–1.0)
Monocytes Relative: 11 %
Neutro Abs: 9 10*3/uL — ABNORMAL HIGH (ref 1.7–7.7)
Neutrophils Relative %: 76 %
Platelets: 230 10*3/uL (ref 150–400)
RBC: 3.39 MIL/uL — ABNORMAL LOW (ref 3.87–5.11)
RDW: 13 % (ref 11.5–15.5)
WBC: 11.8 10*3/uL — ABNORMAL HIGH (ref 4.0–10.5)
nRBC: 0 % (ref 0.0–0.2)

## 2023-02-16 LAB — MAGNESIUM: Magnesium: 2.2 mg/dL (ref 1.7–2.4)

## 2023-02-16 LAB — HEMOGLOBIN A1C
Hgb A1c MFr Bld: 7.9 % — ABNORMAL HIGH (ref 4.8–5.6)
Mean Plasma Glucose: 180 mg/dL

## 2023-02-16 NOTE — Progress Notes (Signed)
Pt oxygen saturation at 91 % on 3 L via nasal cannula. Dr. Gwendolyn Lima in the room with pt and son for update.

## 2023-02-16 NOTE — Progress Notes (Signed)
NAME:  Rebecca Walter, MRN:  [id number], DOB:  1941/03/12, LOS: 5 ADMISSION DATE:  02/11/2023, CONSULTATION DATE:  02/15/23 REFERRING MD:  FTPS , CHIEF COMPLAINT:  Multifocal PNA    History of Present Illness:  82 yo F PMH HFpEF, HTN, DM2 was admitted to [facility] 2/23 after presenting with DOE x46mowith associated cough. She was admitted for tx of multifocal PNA +/- acute HF exacerbation. Initial abx azitho + rocephin. Was then found to have an acinetobacter bactermia, and [facility]  Despite abx and diuresis, pt SOB and hypoxia have persisted (requiring escalation of support to BiPAP), prompting CT chest 2/27 which demonstrated dense multifocal ASD bilaterally, and bilateral pleural effusions.   PCCM is consulted 2/27 in this setting   Pertinent  Medical History  HFpEF HTN HLD GERD Gout Hypothyroidism   Significant Hospital Events: Including procedures, antibiotic start and stop dates in addition to other pertinent events   2/23 admitted [facility] 2/24 unasyn for acinetobacter bacteremia 2/27 CT chest with bad multifocal ASD. PCCM consult. Added mino. ID consulted. RVP sent, coronavirus OC43 pos   Interim History / Subjective:   Mino added yesterday   RVP sent, Coronavirus OC43    On 11L  Objective   Blood pressure (!) 158/69, pulse 72, temperature 98.2 F (36.8 C), temperature source Axillary, resp. rate 19, height 5' (1.524 m), weight 92.8 kg, SpO2 96 %.    FiO2 (%):  [50 %] 50 %   Intake/Output Summary (Last 24 hours) at 02/16/2023 [id number] data filed at 02/16/2023 0500 Gross per 24 hour  Intake --  Output 2550 ml  Net -2550 ml   Filed Weights   02/15/23 0015 02/15/23 0319 02/16/23 0510  Weight: 92.5 kg 93.2 kg 92.8 kg    Examination: General: Ill appearing elderly F  HENT: NCAT pink mm  Lungs: Rhonchi  Cardiovascular:  rr Abdomen: soft ndnt  Extremities: no acute joint deformity Neuro: AAOx3 GU: defer   Resolved Hospital Problem list      Assessment & Plan:   Acute respiratory failure with hypoxia Multifocal PNA -- presume acinetobacter baumannii + coronavirus OC43   Small bilateral pleural effusions Acinetobacter baumannii bacteremia Sepsis due to above DNR status  -worsening resp status despite unasyn (w 9g sulbactam component /d) for acinetobacter.  -as such, mino added for severe dz.  -following, RVP resulted and is + coronavirus OC43 P -cont unasyn and mino -wean O2 as able -- PRN BiPAP  -IS,  mobility   -mucinex  -PRN imaging  -dont think the effusion is anything we can intervene on at the moment   Best Practice (right click and "Reselect all SmartList Selections" daily)   Per primary   Labs   CBC: Recent Labs  Lab 02/11/23 0420 02/11/23 0440 02/12/23 0206 02/13/23 0148 02/14/23 0146 02/15/23 0202 02/16/23 0206  WBC 10.9*  --  10.4 11.3* 12.2* 8.8 11.8*  NEUTROABS 8.3*  --   --   --   --  7.7 9.0*  HGB 11.9*   < > 10.5* 10.4* 10.6* 10.4* 10.4*  HCT 34.8*   < > 30.5* 31.2* 31.9* 31.5* 30.7*  MCV 91.8  --  89.4 90.7 90.6 91.3 90.6  PLT 188  --  192 211 239 213 230   < > = values in this interval not displayed.    Basic Metabolic Panel: Recent Labs  Lab 02/12/23 0206 02/13/23 0148 02/13/23 1456 02/14/23 0146 02/15/23 0202 02/16/23 0206  NA 138  138 137 138 138 141  K 3.5 3.0* 3.8 4.1 4.1 4.0  CL 103 105 103 106 104 106  CO2 25 25 21* '24 24 27  '$ GLUCOSE 163* 157* 316* 219* 281* 209*  BUN '15 18 21 20 '$ 24* 34*  CREATININE 0.90 0.88 0.92 1.01* 0.86 1.02*  CALCIUM 8.4* 8.3* 8.8* 8.7* 8.6* 8.8*  MG 1.8 2.0  --  2.1 2.2 2.2   GFR: Estimated Creatinine Clearance: 42.5 mL/min (A) (by C-G formula based on SCr of 1.02 mg/dL (H)). Recent Labs  Lab 02/11/23 0420 02/11/23 0627 02/12/23 0206 02/13/23 0148 02/14/23 0146 02/15/23 0202 02/16/23 0206  WBC 10.9*  --    < > 11.3* 12.2* 8.8 11.8*  LATICACIDVEN 1.0 0.8  --   --   --   --   --    < > = values in this interval not displayed.     Liver Function Tests: Recent Labs  Lab 02/11/23 0420  AST 27  ALT 21  ALKPHOS 62  BILITOT 0.8  PROT 7.1  ALBUMIN 3.6   No results for input(s): "LIPASE", "AMYLASE" in the last 168 hours. No results for input(s): "AMMONIA" in the last 168 hours.  ABG    Component Value Date/Time   HCO3 21.8 02/11/2023 0440   TCO2 23 02/11/2023 0440   ACIDBASEDEF 4.0 (H) 02/11/2023 0440   O2SAT 97 02/11/2023 0440     Coagulation Profile: Recent Labs  Lab 02/11/23 0420  INR 1.1    Cardiac Enzymes: No results for input(s): "CKTOTAL", "CKMB", "CKMBINDEX", "TROPONINI" in the last 168 hours.  HbA1C: HbA1c, POC (controlled diabetic range)  Date/Time Value Ref Range Status  01/06/2023 11:39 AM 7.7 (A) 0.0 - 7.0 % Final  09/30/2022 02:28 PM 8.2 (A) 0.0 - 7.0 % Final   Hgb A1c MFr Bld  Date/Time Value Ref Range Status  02/15/2023 02:02 AM 7.9 (H) 4.8 - 5.6 % Final    Comment:    (NOTE)         Prediabetes: 5.7 - 6.4         Diabetes: >6.4         Glycemic control for adults with diabetes: <7.0     CBG: Recent Labs  Lab 02/15/23 0741 02/15/23 1112 02/15/23 1539 02/15/23 2104 02/16/23 0759  GLUCAP 225* 170* 354* 323* 178*    Eliseo Gum MSN, AGACNP-BC [place] for pager  02/16/2023, 9:07 AM

## 2023-02-16 NOTE — Progress Notes (Signed)
FMTS Brief Interim Progress Note   Visited patient at the bedside as family asked for an update.  Spoke with Merry Proud patient's son and patient.  Explained to Merry Proud that patient's respiratory status has improved as patient is now on 3 L nasal cannula and that upper lung fields have improved on auscultation.  Also mention that she was evaluated and that they did not recommend thoracentesis as there was not enough fluid.  Mentioned that patient is on 2 antibiotics and has been diuresed and that diuresis most likely helped improve her respiratory status as well.  Patient said that she also felt better.  Patient asked if her sugars were higher due to being on steroids.  I agreed that her glucose was elevated due to steroid, but that today was her last day of steroids informed family that we gave the patient some insulin today.  However would not be giving long-acting insulin tomorrow as her sugars should hopefully improve after discontinuation of steroids.  Informed them that we will continue to watch her respiratory status and encouraged incentive spirometry use.  Patient and her family were very pleasant and did not have any further questions.  Lowry Ram, MD  PGY-1 [place]

## 2023-02-16 NOTE — Plan of Care (Signed)
  Problem: Education: Goal: Ability to demonstrate management of disease process will improve Outcome: Progressing Goal: Ability to verbalize understanding of medication therapies will improve Outcome: Progressing Goal: Individualized Educational Video(s) Outcome: Progressing   Problem: Activity: Goal: Capacity to carry out activities will improve Outcome: Progressing   Problem: Cardiac: Goal: Ability to achieve and maintain adequate cardiopulmonary perfusion will improve Outcome: Progressing   Problem: Education: Goal: Ability to describe self-care measures that may prevent or decrease complications (Diabetes Survival Skills Education) will improve Outcome: Progressing Goal: Individualized Educational Video(s) Outcome: Progressing   Problem: Coping: Goal: Ability to adjust to condition or change in health will improve Outcome: Progressing   Problem: Fluid Volume: Goal: Ability to maintain a balanced intake and output will improve Outcome: Progressing   Problem: Health Behavior/Discharge Planning: Goal: Ability to identify and utilize available resources and services will improve Outcome: Progressing Goal: Ability to manage health-related needs will improve Outcome: Progressing   Problem: Metabolic: Goal: Ability to maintain appropriate glucose levels will improve Outcome: Progressing   Problem: Nutritional: Goal: Maintenance of adequate nutrition will improve Outcome: Progressing Goal: Progress toward achieving an optimal weight will improve Outcome: Progressing   Problem: Skin Integrity: Goal: Risk for impaired skin integrity will decrease Outcome: Progressing   Problem: Tissue Perfusion: Goal: Adequacy of tissue perfusion will improve Outcome: Progressing   Problem: Education: Goal: Knowledge of General Education information will improve Description: Including pain rating scale, medication(s)/side effects and non-pharmacologic comfort measures Outcome:  Progressing   Problem: Health Behavior/Discharge Planning: Goal: Ability to manage health-related needs will improve Outcome: Progressing   Problem: Clinical Measurements: Goal: Ability to maintain clinical measurements within normal limits will improve Outcome: Progressing Goal: Will remain free from infection Outcome: Progressing Goal: Diagnostic test results will improve Outcome: Progressing Goal: Respiratory complications will improve Outcome: Progressing Goal: Cardiovascular complication will be avoided Outcome: Progressing   Problem: Activity: Goal: Risk for activity intolerance will decrease Outcome: Progressing   Problem: Nutrition: Goal: Adequate nutrition will be maintained Outcome: Progressing   Problem: Coping: Goal: Level of anxiety will decrease Outcome: Progressing   Problem: Elimination: Goal: Will not experience complications related to bowel motility Outcome: Progressing Goal: Will not experience complications related to urinary retention Outcome: Progressing   Problem: Pain Managment: Goal: General experience of comfort will improve Outcome: Progressing   Problem: Safety: Goal: Ability to remain free from injury will improve Outcome: Progressing   Problem: Skin Integrity: Goal: Risk for impaired skin integrity will decrease Outcome: Progressing   Problem: Education: Goal: Ability to describe self-care measures that may prevent or decrease complications (Diabetes Survival Skills Education) will improve Outcome: Progressing Goal: Individualized Educational Video(s) Outcome: Progressing   Problem: Coping: Goal: Ability to adjust to condition or change in health will improve Outcome: Progressing   Problem: Fluid Volume: Goal: Ability to maintain a balanced intake and output will improve Outcome: Progressing   Problem: Health Behavior/Discharge Planning: Goal: Ability to identify and utilize available resources and services will  improve Outcome: Progressing Goal: Ability to manage health-related needs will improve Outcome: Progressing   Problem: Metabolic: Goal: Ability to maintain appropriate glucose levels will improve Outcome: Progressing   Problem: Nutritional: Goal: Maintenance of adequate nutrition will improve Outcome: Progressing Goal: Progress toward achieving an optimal weight will improve Outcome: Progressing   Problem: Skin Integrity: Goal: Risk for impaired skin integrity will decrease Outcome: Progressing   Problem: Tissue Perfusion: Goal: Adequacy of tissue perfusion will improve Outcome: Progressing

## 2023-02-16 NOTE — TOC Progression Note (Signed)
Transition of Care [facility]) - Progression Note    Patient Details  Name: Rebecca Walter MRN: [id number] Date of Birth: 1941/09/17  Transition of Care [facility]) CM/SW [place], [place] Phone Number: 02/16/2023, 10:21 AM  Clinical Narrative:     CSW following to fax patient out for SNF placement close to patient being medically ready for dc. Patient currently on HFNC.CSW will continue to follow and assist with patients dc planning needs.        Expected Discharge Plan and Services                                               Social Determinants of Health (SDOH) Interventions SDOH Screenings   Food Insecurity: No Food Insecurity (02/13/2023)  Housing: Low Risk  (02/13/2023)  Transportation Needs: No Transportation Needs (02/13/2023)  Utilities: Not At Risk (02/13/2023)  Alcohol Screen: Low Risk  (11/17/2021)  Depression (PHQ2-9): Low Risk  (02/10/2023)  Financial Resource Strain: Low Risk  (11/17/2021)  Physical Activity: Inactive (11/17/2021)  Social Connections: Moderately Integrated (11/17/2021)  Stress: No Stress Concern Present (11/17/2021)  Tobacco Use: Low Risk  (02/11/2023)    Readmission Risk Interventions     No data to display

## 2023-02-16 NOTE — Evaluation (Signed)
Clinical/Bedside Swallow Evaluation Patient Details  Name: Rebecca Walter MRN: [id number] Date of Birth: 01-15-1941  Today's Date: 02/16/2023 Time: SLP Start Time (ACUTE ONLY): 1015 SLP Stop Time (ACUTE ONLY): 1035 SLP Time Calculation (min) (ACUTE ONLY): 20 min  Past Medical History:  Past Medical History:  Diagnosis Date   Acute pain of both knees 04/04/2022   Anxiety    Arthritis    Basal cell carcinoma 03/02/2021   Candidiasis of skin 02/14/2020   CARCINOMA, BASAL CELL 05/26/2007   Qualifier: History of  By: Barbra Sarks MD, Rachel     Carpal tunnel syndrome of right wrist 11/26/2020   Cervical spinal stenosis 05/11/2022   MRI Brain (05/11/22) shows moderate cervical spinal stenosis   Chronic diastolic CHF (congestive heart failure) ([place])    Echocardiogram 01/2020: EF 70-75, no RWMA, Gr 1 DD, normal RVSF, RVSP 45.8, mild to mod LAE, trivial MR, mild PI   Diabetes mellitus    Diarrhea of presumed infectious origin 01/16/2021   DYSKINESIA, ESOPHAGUS 09/13/2007   Qualifier: Diagnosis of  By: Brigitte Pulse MD, Kimberlee     Early satiety 09/04/2020   Elevated TSH 11/21/2017   Epistaxis 02/04/2020   Generalized weakness 12/15/2018   GERD (gastroesophageal reflux disease)    Gout    History of melanoma 01/31/2021   History of nuclear stress test    Myoview 06/2020: EF 75, no ischemia or infarction, low risk   Hoarseness 11/27/2019   Hyperlipidemia    Hypertension    Hypertensive heart disease with CHF (congestive heart failure) (HCC)    Hyponatremia    Leg fatigue 01/03/2021   Mild aortic stenosis 06/2017   Mild cognitive impairment 09/03/2022   Mild mitral regurgitation 06/2017   Moderate pulmonary valve insufficiency 06/2017   OBESITY, NOS 02/16/2007   Qualifier: Diagnosis of  By: Drucie Ip     Osteopenia    Vertigo 08/29/2013   Past Surgical History:  Past Surgical History:  Procedure Laterality Date   KIDNEY SURGERY     KNEE SURGERY     MENISECTOMY     OTHER SURGICAL  HISTORY Left    Epiretinal Peel Dr Gershon Crane (Ophth)   WRIST SURGERY     HPI:  82 yo F PMH HFpEF, HTN, DM2 was admitted to [facility] 2/23 after presenting with DOE x28mowith associated cough. She was admitted for tx of multifocal PNA +/- acute HF exacerbation. Initial abx azitho + rocephin. Was then found to have an acinetobacter bactermia, and [facility]. Despite abx and diuresis, pt SOB and hypoxia have persisted (requiring escalation of support to BiPAP), prompting CT chest 2/27 which demonstrated dense multifocal ASD bilaterally, and bilateral pleural effusions.Per ENT in 2020 s/p endoscopy, patient with vocal cord edema related to GER. Esophagram complete 7/15/202 and indicated no hiatal hernia or gastroesophageal reflux elicited. Moderate to severe esophageal dysmotility, with a dysmotility pattern suggestive of chronic reflux. Mild cricopharyngeus muscle dysfunction, also typical of chronic gastroesophageal reflux disease. Findings suggest a mild peptic stricture in the lower thoracic esophagus just above the esophagogastric junction, see comments. Normal oral and pharyngeal phase of swallowing.    Assessment / Plan / Recommendation  Clinical Impression  Patient presents with a normal appearing oropharyngeal swallow based on bedside exam. Oral manipulation of bolus appropriate and swallow initiation appearing timely. No overt s/s of aspiration and no oral residual noted post swallow. Given extensive esophageal hx including chronic reflux and decreased esophageal motility, cannot r/o post prandial aspiration as cause of  acute PNA. Patient however reports stable esophageal dysphagia with no recent worsening of symptoms. She is independent with use of compensatory strategies.  No f/u SLP treatment needed at this time. SLP Visit Diagnosis: Dysphagia, unspecified (R13.10)       Diet Recommendation Dysphagia 3 (Mech soft);Thin liquid (patient prefers soft solids)   Liquid Administration  via: Cup;Straw Medication Administration: Whole meds with liquid Supervision: Patient able to self feed Compensations: Slow rate;Small sips/bites;Follow solids with liquid Postural Changes: Seated upright at 90 degrees;Remain upright for at least 30 minutes after po intake    Other  Recommendations Oral Care Recommendations: Oral care BID    Recommendations for follow up therapy are one component of a multi-disciplinary discharge planning process, led by the attending physician.  Recommendations may be updated based on patient status, additional functional criteria and insurance authorization.  Follow up Recommendations No SLP follow up        Swallow Study   General HPI: 82 yo F PMH HFpEF, HTN, DM2 was admitted to [facility] 2/23 after presenting with DOE x34mowith associated cough. She was admitted for tx of multifocal PNA +/- acute HF exacerbation. Initial abx azitho + rocephin. Was then found to have an acinetobacter bactermia, and [facility]. Despite abx and diuresis, pt SOB and hypoxia have persisted (requiring escalation of support to BiPAP), prompting CT chest 2/27 which demonstrated dense multifocal ASD bilaterally, and bilateral pleural effusions.Per ENT in 2020 s/p endoscopy, patient with vocal cord edema related to GER. Esophagram complete 7/15/202 and indicated no hiatal hernia or gastroesophageal reflux elicited. Moderate to severe esophageal dysmotility, with a dysmotility pattern suggestive of chronic reflux. Mild cricopharyngeus muscle dysfunction, also typical of chronic gastroesophageal reflux disease. Findings suggest a mild peptic stricture in the lower thoracic esophagus just above the esophagogastric junction, see comments. Normal oral and pharyngeal phase of swallowing. Type of Study: Bedside Swallow Evaluation Previous Swallow Assessment: see HPI Diet Prior to this Study: Dysphagia 3 (mechanical soft);Thin liquids (Level 0) Temperature Spikes Noted:  No Respiratory Status: Nasal cannula History of Recent Intubation: No Behavior/Cognition: Alert;Cooperative;Pleasant mood Oral Cavity Assessment: Within Functional Limits Oral Care Completed by SLP: Recent completion by staff Oral Cavity - Dentition: Adequate natural dentition Vision: Functional for self-feeding Self-Feeding Abilities: Able to feed self Patient Positioning: Upright in chair Baseline Vocal Quality: Normal Volitional Cough: Congested Volitional Swallow: Able to elicit    Oral/Motor/Sensory Function Overall Oral Motor/Sensory Function: Within functional limits   Ice Chips Ice chips: Not tested   Thin Liquid Thin Liquid: Within functional limits Presentation: Straw;Self Fed    Nectar Thick Nectar Thick Liquid: Not tested   Honey Thick Honey Thick Liquid: Not tested   Puree Puree: Within functional limits Presentation: Spoon;Self Fed   Solid     Solid: Within functional limits Presentation: Self Fed     LParker HannifinMA, CCC-SLP  Rebecca Walter 02/16/2023,10:54 AM

## 2023-02-16 NOTE — Progress Notes (Signed)
Attempting to wean down pt oxygen requirement. Now at [place]. Oxygen saturation maintaining at 92-94 %. Will continue to monitor and wean down as needed.

## 2023-02-16 NOTE — Progress Notes (Signed)
Daily Progress Note Intern Pager: [phone]  Patient name: Rebecca Walter record number: [id number] Date of birth: [birth year] Age: 82 y.o. Gender: female  Primary Care Provider: [name], MD Consultants: ID, CCM Code Status: DNR  Pt Overview and Major Events to Date:  2/23: Admitted 2/27: Patient changed CODE STATUS to DNR  Assessment and Plan: Rebecca Walter is an 82 year old female presenting with shortness of breath, found to have CHF exacerbation pneumonia.  Patient euvolemic, without evidence of CHF exacerbation.  Respiratory status remains tenuous, CCM and infectious disease on board now.  Differential and plan as below.  * Acute hypoxemic respiratory failure (HCC) This morning patient sitting up in chair, eating breakfast on HFNC. Upper lobes CTAB, coarse breath sounds in bilateral lower lobes with diminished breath sounds on right. Subjectively patient reports feeling better, we will closely monitor respiratory status today. Per CCM, we will continue oxygen to maintain saturations greater than 88% and wean as tolerated. Out of bed, flutter valve, and increased Mucinex dosing to help with secretions. CCM discussed patient will likely be a poor candidate for mechanical ventilation, as such patient has opted for CODE STATUS to be DNR. Per CCM and infectious disease minocycline 200 mg was added twice daily.  Attempt to obtain sputum cultures. Respiratory panel positive for coronavirus OC 43. ID recommended SLP evaluation for aspiration, who recommended dysphagia 3 diet.  S/p 3-day course of Solu-Medrol.  Patient euvolemic on exam today, we will not pursue further diuresis-had significant urinary output yesterday which may have improved respiratory status (see HFpEF below). - CCM consulted, appreciate recommendations - Infectious disease consulted, appreciate recommendations - Continue Unasyn (2/24-) and minocycline (2/27-) - Obtain sputum cultures - BiPAP as needed,  wean oxygen as tolerated, maintain SPO2 >88% - Incentive spirometry and flutter valve - Out of bed as able - Continue PT  Bacteremia Actinetobacter calcoaceticus/baumannii bacteremia.  Infectious ID following, and recommended adding minocycline. -Ceftriaxone and azithromycin (2/23) -Unasyn 3 g every 6 hours (2/24-) -Minocycline 200 mg twice daily (2/27-)  Pneumonia of both lower lobes due to infectious organism CT chest yesterday showing severe bilateral pulmonary infiltrates with small parapneumonic effusion.  CCM consulted, they do not recommend thoracentesis at this time.  - Management as above  HFpEF 70-75% Patient responded well to diuresis yesterday with 2.5 L of UOP. Now -3L since admission. Patient reports subjective improvement in respiratory status, which we will monitor closely.  Upper lung fields CTAB as above.  Weight 92.8 kg, down from 93.2 kg yesterday.  Bilateral lower extremity edema remains resolved.  Question if diuresis improved respiratory status.  We will hold off on diuresis today-will consider redosing pending respiratory status.  Electrolytes remained stable today. -Continue home spironolactone '25mg'$  daily -Strict I/Os -Daily weights -Daily BMP, Mg  Diabetes mellitus, type II ([place]) Started on Solu-Medrol yesterday for respiratory status.  Patient had increased blood sugar, particularly at nighttime.  Will consider adding nighttime coverage.  Currently on 30 units Semglee daily with sensitive sliding scale mealtime coverage. -CBG monitoring qAC and qHS   FEN/GI: Dysphagia 3 PPx: Lovenox Dispo: SNF pending clinical improvement  Subjective:  Patient reports improvement in SOB today.  Still requiring BiPAP for comfort, but able to eat on HFNC.  Patient would like it to be known that all medical staff are greatly appreciated for their assistance in her care.  Objective: Temp:  [98 F (36.7 C)-98.7 F (37.1 C)] 98 F (36.7 C) (02/28 1216) Pulse Rate:  [69-89]  69 (02/28 1216) Resp:  [19-22] 19 (02/28 1216) BP: (132-178)/(55-93) 132/61 (02/28 1216) SpO2:  [92 %-98 %] 98 % (02/28 1216) FiO2 (%):  [50 %] 50 % (02/28 0252) Weight:  [92.8 kg] 92.8 kg (02/28 0510) Physical Exam: General: Chronically ill-appearing, sitting up eating breakfast Cardiovascular: RRR, no murmurs Respiratory: CTAB in upper lobes.  Coarse breath sounds in bilateral lower lobes, mildly diminished in right lower lobe.  Normal work of breathing at rest on HFNC, increased effort when speaking. Extremities: Moves all 4 extremities, no edema  Laboratory: Most recent CBC Lab Results  Component Value Date   WBC 11.8 (H) 02/16/2023   HGB 10.4 (L) 02/16/2023   HCT 30.7 (L) 02/16/2023   MCV 90.6 02/16/2023   PLT 230 02/16/2023   Most recent BMP    Latest Ref Rng & Units 02/16/2023    2:06 AM  BMP  Glucose 70 - 99 mg/dL 209   BUN 8 - 23 mg/dL 34   Creatinine 0.44 - 1.00 mg/dL 1.02   Sodium 135 - 145 mmol/L 141   Potassium 3.5 - 5.1 mmol/L 4.0   Chloride 98 - 111 mmol/L 106   CO2 22 - 32 mmol/L 27   Calcium 8.9 - 10.3 mg/dL 8.8     Leslie Dales, DO 02/16/2023, 12:55 PM  PGY-1, [place] Intern pager: [phone], text pages welcome Secure chat group [place]

## 2023-02-16 NOTE — Progress Notes (Signed)
[facility] for Infectious Disease  Date of Admission:  02/11/2023           Reason for visit: Follow up on pneumonia  Current antibiotics: Unasyn Minocycline  ASSESSMENT:    82 y.o. female admitted with:  Multifocal pneumonia with hypoxemic respiratory failure: Suspect secondary to Acinetobacter in the setting of bacteremia plus viral pneumonia with respiratory panel positive for coronavirus OC 43.  Her oxygen saturations remain about the same this morning, but she looks better. Acinetobacter bacteremia: Isolate noted to be pansensitive and suspect secondary to #1. Coronavirus OC 43: Detected on respiratory pathogen panel 02/15/2023 and could be contributory to her overall clinical picture. Small right pleural effusion: Not amenable to thoracentesis at this time.  RECOMMENDATIONS:    Continue current antibiotics with ampicillin-sulbactam and minocycline for targeted coverage of her Acinetobacter isolate Supportive care for coronavirus Plan for 10 days total antibiotics in the setting of bacteremia and pneumonia Appreciate SLP evaluation.  No overt signs or symptoms of aspiration at this time End date for antibiotics is 02/22/2023.  Continue current regimen while inpatient.  If she were to discharge prior to this end date could then convert ampicillin-sulbactam to ciprofloxacin and continue minocycline as is Droplet precautions Will sign off at this time, please give [place] a call as needed   Principal Problem:   Acute hypoxemic respiratory failure ([place]) Active Problems:   Diabetes mellitus, type II ([place])   HFpEF 70-75%   Pneumonia of both lower lobes due to infectious organism   SOB (shortness of breath)   Bacteremia   Multifocal pneumonia   Sepsis due to Acinetobacter baumannii with acute hypoxic respiratory failure without septic shock ([place])    MEDICATIONS:    Scheduled Meds:  allopurinol  100 mg Oral Daily   amLODipine  10 mg Oral Daily   busPIRone  15 mg Oral  BID   diclofenac Sodium  4 g Topical QID   enoxaparin (LOVENOX) injection  40 mg Subcutaneous Q24H   guaiFENesin  1,200 mg Oral BID   insulin aspart  0-5 Units Subcutaneous QHS   insulin aspart  0-9 Units Subcutaneous TID WC   insulin glargine-yfgn  30 Units Subcutaneous Daily   irbesartan  300 mg Oral Daily   lactose free nutrition  237 mL Oral BID BM   levothyroxine  50 mcg Oral Daily   metoCLOPramide  5 mg Oral TID AC   minocycline  200 mg Oral BID   multivitamin with minerals  1 tablet Oral Daily   mouth rinse  15 mL Mouth Rinse 4 times per day   pantoprazole  40 mg Oral BID   polyethylene glycol  17 g Oral Daily   pravastatin  40 mg Oral q1800   sertraline  100 mg Oral Daily   sertraline  50 mg Oral 2 times per day   spironolactone  25 mg Oral Daily   Continuous Infusions:  ampicillin-sulbactam (UNASYN) IV 3 g (02/16/23 1229)   PRN Meds:.hydrOXYzine, mouth rinse  SUBJECTIVE:   24 hour events:  Afebrile, Tmax 98.7 Remains on high flow nasal cannula when evaluated this morning WBC 11.8 MRSA nares negative Respiratory panel positive for coronavirus OC 43 No new imaging   Patient seen this morning.  She is sitting up in the chair.  She remains on approximately unchanged O2 settings but feels much better.  She still coughing some but able to converse more easily.  Review of Systems  All other systems reviewed and  are negative.     OBJECTIVE:   Blood pressure 132/61, pulse 69, temperature 98 F (36.7 C), temperature source Oral, resp. rate 19, height 5' (1.524 m), weight 92.8 kg, SpO2 98 %. Body mass index is 39.96 kg/m.  Physical Exam Constitutional:      Comments: Very pleasant, elderly woman, sitting up in the recliner.  She is able to speak more clearly in full sentences without as much dyspnea today.  Eyes:     Extraocular Movements: Extraocular movements intact.     Conjunctiva/sclera: Conjunctivae normal.  Pulmonary:     Effort: Pulmonary effort is  normal. No respiratory distress.  Musculoskeletal:     Cervical back: Normal range of motion and neck supple.  Skin:    General: Skin is warm and dry.  Neurological:     General: No focal deficit present.     Mental Status: She is oriented to person, place, and time.  Psychiatric:        Mood and Affect: Mood normal.        Behavior: Behavior normal.      Lab Results: Lab Results  Component Value Date   WBC 11.8 (H) 02/16/2023   HGB 10.4 (L) 02/16/2023   HCT 30.7 (L) 02/16/2023   MCV 90.6 02/16/2023   PLT 230 02/16/2023    Lab Results  Component Value Date   NA 141 02/16/2023   K 4.0 02/16/2023   CO2 27 02/16/2023   GLUCOSE 209 (H) 02/16/2023   BUN 34 (H) 02/16/2023   CREATININE 1.02 (H) 02/16/2023   CALCIUM 8.8 (L) 02/16/2023   GFRNONAA 55 (L) 02/16/2023   GFRAA 58 (L) 10/17/2020    Lab Results  Component Value Date   ALT 21 02/11/2023   AST 27 02/11/2023   ALKPHOS 62 02/11/2023   BILITOT 0.8 02/11/2023       Component Value Date/Time   CRP 0.8 (H) 12/02/2014 1438    No results found for: "ESRSEDRATE"   I have reviewed the micro and lab results in Epic.  Imaging: CT CHEST W CONTRAST  Result Date: 02/15/2023 CLINICAL DATA:  Pneumonia. EXAM: CT CHEST WITH CONTRAST TECHNIQUE: Multidetector CT imaging of the chest was performed during intravenous contrast administration. RADIATION DOSE REDUCTION: This exam was performed according to the departmental dose-optimization program which includes automated exposure control, adjustment of the mA and/or kV according to patient size and/or use of iterative reconstruction technique. CONTRAST:  22m OMNIPAQUE IOHEXOL 350 MG/ML SOLN COMPARISON:  Chest x-ray 02/14/2023. FINDINGS: Cardiovascular: Heart size upper normal. No substantial pericardial effusion. Coronary artery calcification is evident. Mild atherosclerotic calcification is noted in the wall of the thoracic aorta. Enlargement of the pulmonary outflow tract/main  pulmonary arteries suggests pulmonary arterial hypertension. Mediastinum/Nodes: No mediastinal lymphadenopathy. There is no hilar lymphadenopathy. The esophagus has normal imaging features. There is no axillary lymphadenopathy. Lungs/Pleura: Scattered areas of septal thickening with patchy/nodular ground-glass opacity identified in both upper lobes. There is dense consolidative airspace disease in the posterior right upper lobe, central right middle lobe and posterior right lower lobe. Collapse/consolidation noted left lower lobe is well. Small to moderate right and tiny left pleural effusions evident. Upper Abdomen: Visualized portion of the upper abdomen is unremarkable. Musculoskeletal: No worrisome lytic or sclerotic osseous abnormality. Degenerative changes noted in the shoulders bilaterally. IMPRESSION: 1. Dense consolidative airspace disease in the posterior right upper lobe, central right middle lobe and posterior right lower lobe. Collapse/consolidation noted left lower lobe. Imaging features are compatible with multifocal pneumonia.  2. Small to moderate right and tiny left pleural effusions. 3. Enlargement of the pulmonary outflow tract/main pulmonary arteries suggests pulmonary arterial hypertension. 4.  Aortic Atherosclerosis (ICD10-I70.0). Electronically Signed   By: Misty Stanley M.D.   On: 02/15/2023 10:29   DG CHEST PORT 1 VIEW  Result Date: 02/14/2023 CLINICAL DATA:  Hypoxia EXAM: PORTABLE CHEST 1 VIEW COMPARISON:  02/11/2023 FINDINGS: Rotated AP portable examination. Gross cardiomegaly. Dense heterogeneous airspace and consolidation of the bilateral lung bases, with increased airspace opacity and consolidation of the peripheral right upper lobe. Osseous structures unremarkable. IMPRESSION: 1. Dense heterogeneous airspace and consolidation of the bilateral lung bases, with increased airspace opacity and consolidation of the peripheral right upper lobe. Findings are consistent with worsened  multifocal infection. 2. Gross cardiomegaly. Electronically Signed   By: Delanna Ahmadi M.D.   On: 02/14/2023 16:06     Imaging independently reviewed in Epic.    [facility]ious [facility] [phone] pager 02/16/2023, 3:49 PM

## 2023-02-16 NOTE — Progress Notes (Signed)
FMTS Brief Progress Note  S: Patient states she had a good day today and the nasal cannula is working well for her.  She does admit that it may be better if she does not go back home and live on her own given she may need more medical help than the 3 times weekly aide that she currently has.  O: BP (!) 154/58 (BP Location: Left Arm)   Pulse 72   Temp 98 F (36.7 C) (Oral)   Resp 18   Ht 5' (1.524 m)   Wt 92.8 kg   LMP  (LMP Unknown)   SpO2 91%   BMI 39.96 kg/m   General: Awake, alert, NAD CV: RRR, no murmurs auscultated Pulm: Diminished breath sounds posterior right upper and lower lobes, normal WOB on 3 L [place] Extremities: No pitting edema in BLEs  A/P: Acute hypoxemic respiratory failure Improved today, continues to be affected by opportunistic bacteria and newly found coronavirus.  However it is reassuring that she is able to sat well on low-flow [place].  I do not have any changes to make to plan tonight.  Continue per day team. - Orders reviewed. Labs for AM ordered, which was adjusted as needed.   T2DM Continues to be difficult to control in setting of Solu-Medrol dosing for respiratory status. -Continue to monitor and provide short acting as needed in addition to long-acting insulin  Wells Guiles, DO 02/16/2023, 8:53 PM PGY-2, [facility] Night Resident  Please page [phone] with questions.

## 2023-02-17 DIAGNOSIS — J189 Pneumonia, unspecified organism: Secondary | ICD-10-CM | POA: Diagnosis not present

## 2023-02-17 DIAGNOSIS — J9601 Acute respiratory failure with hypoxia: Secondary | ICD-10-CM | POA: Diagnosis not present

## 2023-02-17 DIAGNOSIS — I5032 Chronic diastolic (congestive) heart failure: Secondary | ICD-10-CM | POA: Diagnosis not present

## 2023-02-17 LAB — BASIC METABOLIC PANEL
Anion gap: 7 (ref 5–15)
BUN: 34 mg/dL — ABNORMAL HIGH (ref 8–23)
CO2: 26 mmol/L (ref 22–32)
Calcium: 8.6 mg/dL — ABNORMAL LOW (ref 8.9–10.3)
Chloride: 106 mmol/L (ref 98–111)
Creatinine, Ser: 0.89 mg/dL (ref 0.44–1.00)
GFR, Estimated: >60 mL/min (ref >60)
Glucose, Bld: 136 mg/dL — ABNORMAL HIGH (ref 70–99)
Potassium: 3.8 mmol/L (ref 3.5–5.1)
Sodium: 139 mmol/L (ref 135–145)

## 2023-02-17 LAB — CULTURE, BLOOD (ROUTINE X 2)
Culture: NO GROWTH
Special Requests: ADEQUATE

## 2023-02-17 LAB — CBC
HCT: 29.4 % — ABNORMAL LOW (ref 36.0–46.0)
Hemoglobin: 10.2 g/dL — ABNORMAL LOW (ref 12.0–15.0)
MCH: 31 pg (ref 26.0–34.0)
MCHC: 34.7 g/dL (ref 30.0–36.0)
MCV: 89.4 fL (ref 80.0–100.0)
Platelets: 229 10*3/uL (ref 150–400)
RBC: 3.29 MIL/uL — ABNORMAL LOW (ref 3.87–5.11)
RDW: 13 % (ref 11.5–15.5)
WBC: 10.4 10*3/uL (ref 4.0–10.5)
nRBC: 0 % (ref 0.0–0.2)

## 2023-02-17 LAB — GLUCOSE, CAPILLARY
Glucose-Capillary: 101 mg/dL — ABNORMAL HIGH (ref 70–99)
Glucose-Capillary: 225 mg/dL — ABNORMAL HIGH (ref 70–99)
Glucose-Capillary: 297 mg/dL — ABNORMAL HIGH (ref 70–99)
Glucose-Capillary: 314 mg/dL — ABNORMAL HIGH (ref 70–99)

## 2023-02-17 LAB — MAGNESIUM: Magnesium: 2.1 mg/dL (ref 1.7–2.4)

## 2023-02-17 MED ORDER — FUROSEMIDE 10 MG/ML IJ SOLN
40.0000 mg | Freq: Once | INTRAMUSCULAR | Status: AC
  Administered 2023-02-17: 40 mg via INTRAVENOUS
  Filled 2023-02-17: qty 4

## 2023-02-17 MED ORDER — POTASSIUM CHLORIDE CRYS ER 20 MEQ PO TBCR
20.0000 meq | EXTENDED_RELEASE_TABLET | Freq: Once | ORAL | Status: AC
  Administered 2023-02-17: 20 meq via ORAL
  Filled 2023-02-17: qty 1

## 2023-02-17 MED ORDER — LOPERAMIDE HCL 2 MG PO CAPS
2.0000 mg | ORAL_CAPSULE | Freq: Once | ORAL | Status: AC
  Administered 2023-02-18: 2 mg via ORAL
  Filled 2023-02-17: qty 1

## 2023-02-17 MED ORDER — ENSURE ENLIVE PO LIQD
237.0000 mL | Freq: Two times a day (BID) | ORAL | Status: DC
  Administered 2023-02-18 – 2023-02-22 (×5): 237 mL via ORAL

## 2023-02-17 NOTE — Evaluation (Signed)
Occupational Therapy Treatment Patient Details Name: Rebecca Walter MRN: [id number] DOB: July 01, 1941 Today's Date: 02/17/2023   History of present illness Rebecca Walter is an 82 y.o. female presenting with shortness of breath, found to have CHF exacerbation and PNA. PMH significant for HFpEF, HTN, HLD, T2DM, GERD, Gout   OT comments  Pt was seen for OT ADL retraining session today with focus on functional transfers (chair to 3:1 with Min-min guard assist using 3:1), Toileting and toilet transfer (Min guard assist for transfer and Mod Assist for per-care following loose BM, RN staff made aware). Pt with 1 noted drop in O2 during this session to 88%. When assessed, pt had removed [place] and stated "Oh, I didn't know that I did that". Pt quickly returned to 92% when [place] was reapplied. Pt continues to be limited by activity tolerance overall using 5L O2 via [place]. Discharge plan is appropriate, continue toward OT plan of care/goals to assisit in maximizing independence with ADL's and self care prior to d/c to SNF rehab.   Recommendations for follow up therapy are one component of a multi-disciplinary discharge planning process, led by the attending physician.  Recommendations may be updated based on patient status, additional functional criteria and insurance authorization.    Follow Up Recommendations  Skilled nursing-short term rehab (<3 hours/day)     Assistance Recommended at Discharge Frequent or constant Supervision/Assistance  Patient can return home with the following  A little help with walking and/or transfers;A lot of help with bathing/dressing/bathroom;Assistance with cooking/housework;Assist for transportation;Help with stairs or ramp for entrance   Equipment Recommendations  Other (comment) (Defer to next venue)    Recommendations for Other Services      Precautions / Restrictions Precautions Precautions: Fall Precaution Comments: watch SpO2 Restrictions Weight Bearing Restrictions:  No       Mobility Bed Mobility   General bed mobility comments: Pt received up in chair upon OT arrival    Transfers Overall transfer level: Needs assistance Equipment used: Rolling walker (2 wheels) Transfers: Sit to/from Stand, Bed to chair/wheelchair/BSC Sit to Stand: Min assist, +2 safety/equipment     Step pivot transfers: Min guard, +2 safety/equipment     General transfer comment: assist for initial rise from chair, transition to min guard for safety with transfer to/from 3:1 and back to recliner chair     Balance Overall balance assessment: Mild deficits observed, not formally tested Sitting-balance support: Feet supported, Feet unsupported Sitting balance-Leahy Scale: Good     Standing balance support: Bilateral upper extremity supported, During functional activity, Reliant on assistive device for balance Standing balance-Leahy Scale: Fair Standing balance comment: Pt reliant on RW during functional transfer from chair to/from 3:1 and back to chair with Min guard assist, vc's for pursed lip breathing     ADL either performed or assessed with clinical judgement   ADL Overall ADL's : Needs assistance/impaired Eating/Feeding: Independent;Sitting   Grooming: Set up;Sitting   Lower Body Bathing: Sit to/from stand;Moderate assistance Lower Body Bathing Details (indicate cue type and reason): Peri care after standing from 3:1 using RW on 5L O2 via [place]   Toilet Transfer: Min guard;Stand-pivot;Ambulation;BSC/3in1;Rolling walker (2 wheels)   Toileting- Clothing Manipulation and Hygiene: Moderate assistance;Sit to/from stand Toileting - Clothing Manipulation Details (indicate cue type and reason): Pt with loose BM on way to 3:1, Mod A to for peri care after standing using RW     Functional mobility during ADLs: Min guard;Minimal assistance;Rolling walker (2 wheels) General ADL Comments: Pt was seen  for OT ADL retraining session today with focus on functional transfers  (chair to 3:1 with Min-min guard assist using 3:1), Toileting and toilet transfer (Min guard assist for transfer and Mod Assist for per-care following loose BM, RN staff made aware). Pt with 1 noted drop in O2 during this session to 88%. When assessed, pt had removed [name] and stated "Oh, I didn't know that I did that". Pt quickly returned to 92% when [name] was reapplied. Pt continues to be limited by activity tolerance overall using 5L O2 via [place]. Discharge plan is appropriate, continue toward OT plan of care/goals to assisit in maximizing independence with ADL's and self care prior to d/c to SNF rehab.    Extremity/Trunk Assessment Upper Extremity Assessment Upper Extremity Assessment: Defer to OT evaluation;Generalized weakness LUE Deficits / Details: chronic pain at rest secondary to pt report of neuropathy, h/o paresthesias, limited use for mobility and ADLs LUE Coordination: decreased gross motor   Lower Extremity Assessment Lower Extremity Assessment: Defer to PT evaluation   Cervical / Trunk Assessment Cervical / Trunk Assessment: Kyphotic    Vision Baseline Vision/History: 0 No visual deficits Patient Visual Report: No change from baseline Vision Assessment?: No apparent visual deficits          Cognition Arousal/Alertness: Awake/alert Behavior During Therapy: WFL for tasks assessed/performed Overall Cognitive Status: Within Functional Limits for tasks assessed   General Comments: pt reports anxiety at times due to fear of SOB              General Comments Pt received on 5L O2 via [place], initially at 92%, pt briefly dropped to 88% when sitting on 3:1 but pt had removed [name]. She quickly recovered to 92-93% when [place] was reapplied.    Pertinent Vitals/ Pain       Pain Assessment Pain Assessment: No/denies pain Pain Score: 0-No pain  Home Living Family/patient expects to be discharged to:: Private residence Living Arrangements: Alone Available Help at Discharge: Personal care  attendant;Available PRN/intermittently (3 days a week for total of 8 hours) Type of Home: Apartment Home Access: Level entry   Home Layout: One level   Bathroom Shower/Tub: Occupational psychologist: Handicapped height   Home Equipment: Rollator (4 wheels);Cane - single point;Shower seat;BSC/3in1      Prior Functioning/Environment   Pt reports having had home health aide 3 times a week for a total of 8 hours that assist with bathing and homemaking tasks. Please refer to initial OT assessment for details of PLOF details   Frequency  Min 2X/week        Progress Toward Goals  OT Goals(current goals can now be found in the care plan section)     Acute Rehab OT Goals Patient Stated Goal: to get breathing better, feel better OT Goal Formulation: With patient Time For Goal Achievement: 02/27/23 Potential to Achieve Goals: Good  Plan         AM-PAC OT "6 Clicks" Daily Activity     Outcome Measure   Help from another person eating meals?: None Help from another person taking care of personal grooming?: A Little Help from another person toileting, which includes using toliet, bedpan, or urinal?: A Lot Help from another person bathing (including washing, rinsing, drying)?: A Lot Help from another person to put on and taking off regular upper body clothing?: A Little Help from another person to put on and taking off regular lower body clothing?: A Lot 6 Click Score: 16  End of Session Equipment Utilized During Treatment: Rolling walker (2 wheels);Oxygen  OT Visit Diagnosis: Unsteadiness on feet (R26.81);Other abnormalities of gait and mobility (R26.89);Muscle weakness (generalized) (M62.81)   Activity Tolerance Patient tolerated treatment well   Patient Left in chair;with call bell/phone within reach;with chair alarm set   Nurse Communication Mobility status;Other (comment) (Pt with loose BM on way to 3:1 and was assisted with peri care and back in chair, with  chair alarm set)        Time: [id number] OT Time Calculation (min): 30 min  Charges: OT General Charges $OT Visit: 1 Visit OT Treatments $Self Care/Home Management : 23-37 mins   Meghin Thivierge Beth Dixon, OTR/L 02/17/2023, 12:12 PM

## 2023-02-17 NOTE — Progress Notes (Addendum)
Nutrition Follow-up  DOCUMENTATION CODES:   Obesity unspecified  INTERVENTION:  Will discontinue Boost Plus.  Provide Ensure Enlive po BID, each supplement provides 350 kcal and 20 grams of protein. Pt reports she prefers vanilla.  Continue Magic cup TID with lunch and dinner, each supplement provides 290 kcal and 9 grams of protein.  Continue multivitamin with minerals po daily.  NUTRITION DIAGNOSIS:   Inadequate oral intake related to decreased appetite, acute illness as evidenced by per patient/family report.  Pt reports appetite is improving.  GOAL:   Patient will meet greater than or equal to 90% of their needs  Progressing with interventions.  MONITOR:   PO intake, Supplement acceptance, Labs, Weight trends, I & O's  REASON FOR ASSESSMENT:   Consult Assessment of nutrition requirement/status  ASSESSMENT:   82 year old female with PMHx of HFpEF, HTN, HLD, T2DM, GERD, gout admitted with shortness of breath found to have CHF exacerbation and PNA, also with bacteremia.  2/28: s/p SLP evaluation with recommendation for dysphagia 3 diet with thin liquids   Met with pt at bedside. She reports she is feeling much better today. She has been wearing BiPAP at night and on HFNC or [place] during the day. She reports her appetite is returning as she is feeling better. She is eating 75-100% of her meals. She has not been drinking oral nutrition supplements as they have been bringing her Boost Breeze instead of Boost Plus and she does not like [employer]. She would like to receive Ensure instead. She enjoys [employer] supplements. Encouraged ongoing intake of calories and protein at meals and from supplements. Denies nausea, emesis, or abdominal pain. Tolerating dysphagia 3 diet well.   Admission wt 94.9 kg. Current wt 93 kg. Will continue to monitor trends.  Medications reviewed and include: allopurinol, Novolog 0-5 units QHS, Novolog 0-9 units TID, Semglee 30 units daily, Boost  Plus, levothyroxine, Reglan 5 mg TID, Miralax, sertraline, spironolactone, Unasyn, MVI  Labs reviewed: CBG 101-314, BUN 34  UOP: 1200 mL (1.1 mL/kg/hr)  I/O: -4919.6 ML since admission  Diet Order:   Diet Order             DIET DYS 3 Room service appropriate? Yes with Assist; Fluid consistency: Thin  Diet effective now                  EDUCATION NEEDS:   Not appropriate for education at this time  Skin:  Skin Assessment: Reviewed RN Assessment  Last BM:  02/17/23 - type 7  Height:   Ht Readings from Last 1 Encounters:  02/11/23 5' (1.524 m)   Weight:   Wt Readings from Last 1 Encounters:  02/17/23 93 kg   Ideal Body Weight:  45.5 kg  BMI:  Body mass index is 40.04 kg/m.  Estimated Nutritional Needs:   Kcal:  1600-1800  Protein:  80-90 grams  Fluid:  1.6-1.8 L/day  Loanne Drilling, MS, RD, LDN, CNSC Pager number available on Amion

## 2023-02-17 NOTE — Progress Notes (Signed)
Daily Progress Note Intern Pager: [phone]  Patient name: Rebecca Walter record number: [id number] Date of birth: 1941/08/08 Age: 82 y.o. Gender: female  Primary Care Provider: [name], MD Consultants: CCM, ID s/o Code Status: DNR  Pt Overview and Major Events to Date:  2/23: Admitted 2/27: Patient changed CODE STATUS to DNR  Assessment and Plan: Rebecca Walter is an 82 year old female presenting with shortness of breath, found to have CHF exacerbation pneumonia.  Mild fluid retention today.  Respiratory status improving.  CCM following, infectious disease signed off after providing recommendations.  Suspect acute hypoxic respiratory failure is 2/2 Acinetobacter bacteremia/viral pneumonia (coronavirus OC 43). Differential and plan as below.  * Acute hypoxemic respiratory failure ([place]) Patient on 5L HFNC, maintaining O2 saturations >90% without increased work of breathing.  Upper airways remain clear to auscultation, coarse but improved breath sounds bilateral lower lobes.  Subjectively patient reports feeling better.  Continue to monitor respiratory status today.  Continue out of bed, flutter valve and incentive spirometry.  Continue antibiotics as below.  Patient with evidence of +1 pitting edema on exam today, repeat diuresis today-had significant urinary output from prior diuresis which may have improved respiratory status (see HFpEF below). - CCM consulted, appreciate recommendations - Continue Unasyn (2/24-3/5) and minocycline (2/27-3/5) - BiPAP as needed, wean oxygen as tolerated, maintain SPO2 >88% - Incentive spirometry and flutter valve - Out of bed as able - Continue PT  Bacteremia Actinetobacter calcoaceticus/baumannii bacteremia.  Infectious disease following, and recommended adding minocycline. Plan for 10 day total course of Abx per ID. If discharges can switch Unasyn to ciprofloxacin and continue minocycline as is. -Ceftriaxone and azithromycin  (2/23) -Unasyn 3 g every 6 hours (2/24-3/5) -Minocycline 200 mg twice daily (2/27-3/5)  Pneumonia of both lower lobes due to infectious organism CTAB in upper lung field.  Coarse but improved breath sounds bilateral lower bases. - Management as above  HFpEF 70-75% Patient responded well to diuresis with 40 mg IV Lasix on 2/27, with 2.5 L urine output.  Weight increased to 93 kg today.  Evidence of +1 pitting edema on left lower extremity, with trace pitting edema on right.  Upper lung fields CTAB as above. Cr 0.89 today. Will redose lasix today.  K+ 3.8, will replete with 20 mEq.  -40 mg IV lasix today -Strict I/Os -Daily weights -Daily BMP, Mg  Diabetes mellitus, type II ([place]) Blood glucose improved now that patient is off of Solu-Medrol. Currently on 30 units Semglee daily with sensitive sliding scale mealtime coverage. -CBG monitoring qAC and qHS   FEN/GI: Dysphagia 3 PPx: Lovenox Dispo: Pending clinical improvement  Subjective:  Patient reports feeling better today, would like to be assisted to chair.  Objective: Temp:  [97.2 F (36.2 C)-98.2 F (36.8 C)] 98.2 F (36.8 C) (02/29 0738) Pulse Rate:  [64-76] 70 (02/29 0738) Resp:  [17-19] 18 (02/29 0738) BP: (132-154)/(58-73) 154/73 (02/29 0738) SpO2:  [91 %-99 %] 92 % (02/29 0738) FiO2 (%):  [50 %] 50 % (02/29 0600) Weight:  [93 kg] 93 kg (02/29 0410) Physical Exam: General: NAD, resting comfortably, chronically ill-appearing Cardiovascular: RRR, no murmurs Respiratory: CTAB in upper lung lobes, coarse but improved breath sounds bilateral lower bases.  Normal work of breathing and maintaining SpO2 >90% on 5L HFNC. Abdomen: Soft, not tender, not distended.  Bowel sounds present Extremities: +1 pitting edema on left, trace pitting on right.  Moves all 4 extremities.  Laboratory: Most recent CBC Lab Results  Component Value Date   WBC 10.4 02/17/2023   HGB 10.2 (L) 02/17/2023   HCT 29.4 (L) 02/17/2023   MCV 89.4  02/17/2023   PLT 229 02/17/2023   Most recent BMP    Latest Ref Rng & Units 02/17/2023    1:32 AM  BMP  Glucose 70 - 99 mg/dL 136   BUN 8 - 23 mg/dL 34   Creatinine 0.44 - 1.00 mg/dL 0.89   Sodium 135 - 145 mmol/L 139   Potassium 3.5 - 5.1 mmol/L 3.8   Chloride 98 - 111 mmol/L 106   CO2 22 - 32 mmol/L 26   Calcium 8.9 - 10.3 mg/dL 8.6    Rebecca Dales, DO 02/17/2023, 9:25 AM  PGY-1, [place] Intern pager: [phone], text pages welcome Secure chat group [place]

## 2023-02-17 NOTE — Progress Notes (Signed)
Physical Therapy Treatment Patient Details Name: Rebecca Walter MRN: [id number] DOB: 07-Feb-1941 Today's Date: 02/17/2023   History of Present Illness Rebecca Walter is an 82 y.o. female presenting with shortness of breath, found to have CHF exacerbation and PNA. PMH significant for HFpEF, HTN, HLD, T2DM, GERD, Gout    PT Comments    Pt tolerated treatment well today. Pt was able to progress to ambulation in the hallway with RW CGA +2 for chair follow and equipment management. Pt would benefit from continued gait training during acute stay. No change in DC/DME recs.   Recommendations for follow up therapy are one component of a multi-disciplinary discharge planning process, led by the attending physician.  Recommendations may be updated based on patient status, additional functional criteria and insurance authorization.  Follow Up Recommendations  Skilled nursing-short term rehab (<3 hours/day) Can patient physically be transported by private vehicle: Yes   Assistance Recommended at Discharge Frequent or constant Supervision/Assistance  Patient can return home with the following A little help with walking and/or transfers;A little help with bathing/dressing/bathroom   Equipment Recommendations  None recommended by PT (per accepting facility)    Recommendations for Other Services       Precautions / Restrictions Precautions Precautions: Fall Precaution Comments: watch SpO2 Restrictions Weight Bearing Restrictions: No     Mobility  Bed Mobility               General bed mobility comments: Pt received up in chair upon PT arrival    Transfers Overall transfer level: Needs assistance Equipment used: Rolling walker (2 wheels) Transfers: Sit to/from Stand Sit to Stand: +2 safety/equipment, Min assist                Ambulation/Gait Ambulation/Gait assistance: Min guard, +2 safety/equipment (Chair follow) Gait Distance (Feet): 120 Feet (57f x2 1 seated rest  break) Assistive device: Rolling walker (2 wheels) Gait Pattern/deviations: Step-through pattern, Decreased stride length, Trunk flexed Gait velocity: decr     General Gait Details: Cues for proximity to RW, breathing technique   Stairs             Wheelchair Mobility    Modified Rankin (Stroke Patients Only)       Balance Overall balance assessment: Mild deficits observed, not formally tested                                          Cognition Arousal/Alertness: Awake/alert Behavior During Therapy: WFL for tasks assessed/performed Overall Cognitive Status: Within Functional Limits for tasks assessed                                          Exercises      General Comments General comments (skin integrity, edema, etc.): Pt received on 4L O2 at 92%, Pt dropped to 87% on 4L during ambulation. Titrated to 6L and recovered to 93% with further ambulation. Pt up to 99% on 4L once seated in room.      Pertinent Vitals/Pain Pain Assessment Pain Assessment: No/denies pain    Home Living                          Prior Function            PT  Goals (current goals can now be found in the care plan section) Progress towards PT goals: Progressing toward goals    Frequency    Min 2X/week      PT Plan Current plan remains appropriate    Co-evaluation              AM-PAC PT "6 Clicks" Mobility   Outcome Measure  Help needed turning from your back to your side while in a flat bed without using bedrails?: A Little Help needed moving from lying on your back to sitting on the side of a flat bed without using bedrails?: A Lot Help needed moving to and from a bed to a chair (including a wheelchair)?: A Lot Help needed standing up from a chair using your arms (e.g., wheelchair or bedside chair)?: A Little Help needed to walk in hospital room?: A Little Help needed climbing 3-5 steps with a railing? : A Lot 6 Click  Score: 15    End of Session Equipment Utilized During Treatment: Oxygen;Gait belt Activity Tolerance: Patient tolerated treatment well Patient left: in chair;with call bell/phone within reach;with chair alarm set Nurse Communication: Mobility status PT Visit Diagnosis: Other abnormalities of gait and mobility (R26.89);Muscle weakness (generalized) (M62.81)     Time: [id number] PT Time Calculation (min) (ACUTE ONLY): 31 min  Charges:  $Gait Training: 8-22 mins $Therapeutic Activity: 8-22 mins                     Shelby Mattocks, PT, DPT Acute Rehab Services [id number]    Viann Shove 02/17/2023, 3:57 PM

## 2023-02-17 NOTE — Progress Notes (Signed)
Attending:    Subjective: Feels better Oxygenation improved  Objective: Vitals:   02/17/23 0409 02/17/23 0410 02/17/23 0600 02/17/23 0738  BP: (!) 151/69   (!) 154/73  Pulse: 72  65 70  Resp: '18  17 18  '$ Temp: (!) 97.2 F (36.2 C)   98.2 F (36.8 C)  TempSrc: Axillary   Axillary  SpO2: 92%  97% 92%  Weight:  93 kg    Height:       FiO2 (%):  [50 %] 50 %  Intake/Output Summary (Last 24 hours) at 02/17/2023 1104 Last data filed at 02/17/2023 0910 Gross per 24 hour  Intake 800 ml  Output 1400 ml  Net -600 ml    General:  Resting comfortably in chair HENT: NCAT OP clear PULM: Rhonchi B, normal effort CV: RRR, no mgr GI: BS+, soft, nontender MSK: normal bulk and tone Neuro: awake, alert, no distress, MAEW    CBC    Component Value Date/Time   WBC 10.4 02/17/2023 0132   RBC 3.29 (L) 02/17/2023 0132   HGB 10.2 (L) 02/17/2023 0132   HGB 13.7 04/15/2022 1510   HCT 29.4 (L) 02/17/2023 0132   HCT 40.4 04/15/2022 1510   PLT 229 02/17/2023 0132   PLT 203 04/15/2022 1510   MCV 89.4 02/17/2023 0132   MCV 92 04/15/2022 1510   MCH 31.0 02/17/2023 0132   MCHC 34.7 02/17/2023 0132   RDW 13.0 02/17/2023 0132   RDW 13.0 04/15/2022 1510   LYMPHSABS 1.3 02/16/2023 0206   LYMPHSABS 2.1 04/15/2022 1510   MONOABS 1.3 (H) 02/16/2023 0206   EOSABS 0.1 02/16/2023 0206   EOSABS 0.4 04/15/2022 1510   BASOSABS 0.0 02/16/2023 0206   BASOSABS 0.1 04/15/2022 1510    BMET    Component Value Date/Time   NA 139 02/17/2023 0132   NA 139 12/03/2022 1537   K 3.8 02/17/2023 0132   CL 106 02/17/2023 0132   CO2 26 02/17/2023 0132   GLUCOSE 136 (H) 02/17/2023 0132   BUN 34 (H) 02/17/2023 0132   BUN 35 (H) 12/03/2022 1537   CREATININE 0.89 02/17/2023 0132   CREATININE 0.93 09/16/2016 1410   CALCIUM 8.6 (L) 02/17/2023 0132   GFRNONAA >60 02/17/2023 0132   GFRNONAA 70 02/06/2015 1418   GFRAA 58 (L) 10/17/2020 1437   GFRAA 81 02/06/2015 1418     Impression/Plan: Acinetobacter  pneumonia> continue unasyn/minocycline per ID Acute respiratory failure with hypoxemia> wean off for o2 > 88%, out of bed, incentive spiro, flutter, CXR if condition worsens  PCCM will sign off  Roselie Awkward, MD [place] PCCM Pager: [phone] Cell: [phone] After 7pm: [phone]

## 2023-02-18 DIAGNOSIS — I5032 Chronic diastolic (congestive) heart failure: Secondary | ICD-10-CM | POA: Diagnosis not present

## 2023-02-18 DIAGNOSIS — J189 Pneumonia, unspecified organism: Secondary | ICD-10-CM | POA: Diagnosis not present

## 2023-02-18 DIAGNOSIS — J9601 Acute respiratory failure with hypoxia: Secondary | ICD-10-CM | POA: Diagnosis not present

## 2023-02-18 LAB — BASIC METABOLIC PANEL
Anion gap: 9 (ref 5–15)
BUN: 26 mg/dL — ABNORMAL HIGH (ref 8–23)
CO2: 26 mmol/L (ref 22–32)
Calcium: 8.5 mg/dL — ABNORMAL LOW (ref 8.9–10.3)
Chloride: 104 mmol/L (ref 98–111)
Creatinine, Ser: 0.78 mg/dL (ref 0.44–1.00)
GFR, Estimated: >60 mL/min (ref >60)
Glucose, Bld: 121 mg/dL — ABNORMAL HIGH (ref 70–99)
Potassium: 3.8 mmol/L (ref 3.5–5.1)
Sodium: 139 mmol/L (ref 135–145)

## 2023-02-18 LAB — GLUCOSE, CAPILLARY
Glucose-Capillary: 145 mg/dL — ABNORMAL HIGH (ref 70–99)
Glucose-Capillary: 148 mg/dL — ABNORMAL HIGH (ref 70–99)
Glucose-Capillary: 175 mg/dL — ABNORMAL HIGH (ref 70–99)
Glucose-Capillary: 354 mg/dL — ABNORMAL HIGH (ref 70–99)

## 2023-02-18 LAB — CBC WITH DIFFERENTIAL/PLATELET
Abs Immature Granulocytes: 0.15 10*3/uL — ABNORMAL HIGH (ref 0.00–0.07)
Basophils Absolute: 0 10*3/uL (ref 0.0–0.1)
Basophils Relative: 0 %
Eosinophils Absolute: 0.4 10*3/uL (ref 0.0–0.5)
Eosinophils Relative: 4 %
HCT: 31.3 % — ABNORMAL LOW (ref 36.0–46.0)
Hemoglobin: 10.5 g/dL — ABNORMAL LOW (ref 12.0–15.0)
Immature Granulocytes: 1 %
Lymphocytes Relative: 16 %
Lymphs Abs: 1.8 10*3/uL (ref 0.7–4.0)
MCH: 30.3 pg (ref 26.0–34.0)
MCHC: 33.5 g/dL (ref 30.0–36.0)
MCV: 90.5 fL (ref 80.0–100.0)
Monocytes Absolute: 1 10*3/uL (ref 0.1–1.0)
Monocytes Relative: 9 %
Neutro Abs: 7.6 10*3/uL (ref 1.7–7.7)
Neutrophils Relative %: 70 %
Platelets: 256 10*3/uL (ref 150–400)
RBC: 3.46 MIL/uL — ABNORMAL LOW (ref 3.87–5.11)
RDW: 13 % (ref 11.5–15.5)
WBC: 10.9 10*3/uL — ABNORMAL HIGH (ref 4.0–10.5)
nRBC: 0 % (ref 0.0–0.2)

## 2023-02-18 LAB — MAGNESIUM: Magnesium: 1.8 mg/dL (ref 1.7–2.4)

## 2023-02-18 MED ORDER — POLYVINYL ALCOHOL 1.4 % OP SOLN
1.0000 [drp] | OPHTHALMIC | Status: DC | PRN
  Administered 2023-02-19: 1 [drp] via OPHTHALMIC
  Filled 2023-02-18: qty 15

## 2023-02-18 NOTE — Progress Notes (Signed)
Daily Progress Note Intern Pager: [phone]  Patient name: Rebecca Walter record number: [id number] Date of birth: 1941-09-23 Age: 82 y.o. Gender: female  Primary Care Provider: [name], MD Consultants: CCM s/o, ID s/o Code Status: DNR  Pt Overview and Major Events to Date:  2/23: Admitted 2/27: Patient changed CODE STATUS to DNR  Assessment and Plan:  Rebecca Walter is an 82 year old female presenting with shortness of breath, found to have CHF exacerbation pneumonia.  Suspect acute hypoxic respiratory failure is 2/2 Acinetobacter bacteremia/viral pneumonia (coronavirus OC 43).   Respiratory status improving. Patient did not require BiPAP overnight. Appears stable clinically on 5L [place]. Encouraged patient to continue incentive spirometry and ambulation. Will need to continue antibiotics until 3/5. Once respiratory status has improved back to baseline, room air, can transition antibiotics to PO.   * Acute hypoxemic respiratory failure (HCC) Subjectively patient reports feeling better.  Pitting edema appears to be improving s/p diuresis 2/29.  - Continue Unasyn (2/24-3/5) and minocycline (2/27-3/5) - BiPAP as needed, wean oxygen as tolerated, maintain SPO2 >88% - Incentive spirometry and flutter valve - Out of bed as able - Continue PT  Bacteremia Actinetobacter calcoaceticus/baumannii bacteremia.  ID following, and recommended adding minocycline. Plan for 10 day total course of Abx per ID. If discharges can switch Unasyn to ciprofloxacin and continue minocycline as is. -Ceftriaxone and azithromycin (2/23) -Unasyn 3 g every 6 hours (2/24-3/5) -Minocycline 200 mg twice daily (2/27-3/5)  HFpEF 70-75% S/P 40 mg Lasix 2/29. Urine output 1.7 L yesterday. Weight 205 > 199.7 lbs today. Respiratory status improving. LE edema improved from yesterdays exam.  - Strict I/Os - Daily weights - Daily BMP, Mg  Diabetes mellitus, type II ([place]) Blood glucose improved now that  patient is off of Solu-Medrol. Currently on 30 units Semglee daily with sensitive sliding scale mealtime coverage. Received 12 units Aspart yesterday.  -CBG monitoring qAC and qHS    FEN/GI: Dysphagia 3 PPx: Lovenox Dispo: Pending clinical improvement   Subjective:  Patient doing well this morning.  Sitting up and eating breakfast in chair.  She reports respiratory status has improved she did not require BiPAP overnight.  Objective: Temp:  [97.8 F (36.6 C)-98.3 F (36.8 C)] 98.1 F (36.7 C) (03/01 0557) Pulse Rate:  [64-75] 68 (03/01 0900) Resp:  [16-20] 18 (03/01 0557) BP: (127-162)/(53-65) 127/55 (03/01 0900) SpO2:  [91 %-97 %] 97 % (03/01 0900) Weight:  [90.6 kg] 90.6 kg (03/01 0557) Physical Exam: Chronically ill and frail-appearing, no acute distress Cardio: Regular rate, regular rhythm, no murmurs on exam. Pulm: Diminished breath sounds in the lung bases.  No increased work of breathing Abdominal: bowel sounds present, soft, non-tender, non-distended Extremities: no peripheral edema  Neuro: alert and oriented x3, speech normal in content, no facial asymmetry  Laboratory: Most recent CBC Lab Results  Component Value Date   WBC 10.9 (H) 02/18/2023   HGB 10.5 (L) 02/18/2023   HCT 31.3 (L) 02/18/2023   MCV 90.5 02/18/2023   PLT 256 02/18/2023   Most recent BMP    Latest Ref Rng & Units 02/18/2023    5:48 AM  BMP  Glucose 70 - 99 mg/dL 121   BUN 8 - 23 mg/dL 26   Creatinine 0.44 - 1.00 mg/dL 0.78   Sodium 135 - 145 mmol/L 139   Potassium 3.5 - 5.1 mmol/L 3.8   Chloride 98 - 111 mmol/L 104   CO2 22 - 32 mmol/L 26   Calcium  8.9 - 10.3 mg/dL 8.5     Darci Current, DO 02/18/2023, 9:18 AM  PGY-1, [place] Intern pager: [phone], text pages welcome Secure chat group [place]

## 2023-02-18 NOTE — Progress Notes (Signed)
Current bed offers provided to pt and pt's son Merry Proud, they plan to review and update SW re choice. Once SNF chosen, they will need to start [place]. SW will provide updates as available.   Wandra Feinstein, MSW, LCSW [phone] (coverage)

## 2023-02-18 NOTE — Progress Notes (Signed)
Mobility Specialist Progress Note:   02/18/23 0905  Mobility  Activity Transferred to/from [facility] (STS x5)  Level of Assistance Contact guard assist, steadying assist  Assistive Device Front wheel walker;BSC  Distance Ambulated (ft) 3 ft  Activity Response Tolerated well  Mobility Referral Yes  $Mobility charge 1 Mobility   Pt agreeable to mobility session. Performed x5 STS and chair level exercises. Requested to transfer to [facility] for BM. Required only minG assist throughout. Pt left on [facility] with RN in room.  Nelta Numbers Mobility Specialist Please contact via SecureChat or  Rehab office at [phone]

## 2023-02-19 DIAGNOSIS — J189 Pneumonia, unspecified organism: Secondary | ICD-10-CM | POA: Diagnosis not present

## 2023-02-19 DIAGNOSIS — R7881 Bacteremia: Secondary | ICD-10-CM | POA: Diagnosis not present

## 2023-02-19 DIAGNOSIS — I5032 Chronic diastolic (congestive) heart failure: Secondary | ICD-10-CM | POA: Diagnosis not present

## 2023-02-19 DIAGNOSIS — J9601 Acute respiratory failure with hypoxia: Secondary | ICD-10-CM | POA: Diagnosis not present

## 2023-02-19 LAB — GLUCOSE, CAPILLARY
Glucose-Capillary: 128 mg/dL — ABNORMAL HIGH (ref 70–99)
Glucose-Capillary: 172 mg/dL — ABNORMAL HIGH (ref 70–99)
Glucose-Capillary: 306 mg/dL — ABNORMAL HIGH (ref 70–99)
Glucose-Capillary: 335 mg/dL — ABNORMAL HIGH (ref 70–99)
Glucose-Capillary: 275 mg/dL — ABNORMAL HIGH (ref 70–99)

## 2023-02-19 MED ORDER — FUROSEMIDE 40 MG PO TABS
40.0000 mg | ORAL_TABLET | Freq: Every day | ORAL | Status: DC
  Administered 2023-02-19 – 2023-02-22 (×4): 40 mg via ORAL
  Filled 2023-02-19 (×4): qty 1

## 2023-02-19 MED ORDER — SODIUM CHLORIDE 0.9 % IV SOLN: INTRAVENOUS | Status: DC | PRN

## 2023-02-19 NOTE — Progress Notes (Signed)
Pt on 2L [place] and tolerating with no distress noted. Pt refusing BIPAP at this time will continue to monitor for any changes

## 2023-02-19 NOTE — Progress Notes (Signed)
Daily Progress Note Intern Pager: [phone]  Patient name: Rebecca Walter [place] record number: [id number] Date of birth: 1941-01-14 Age: 82 y.o. Gender: female  Primary Care Provider: [name], MD Consultants: PCCM s/o, ID s/o Code Status: DNR  Pt Overview and Major Events to Date:  2/23- admitted 2/27- code status changed to DNR  Assessment and Plan: Rebecca Walter is an 82yo female who presented with SOB and was found to have a pneumonia with superimposed Acinetobacter bacteremia and CHF exacerbation. Pertinent PMH/PSH includes HFpEF, DM2.   * Acute hypoxemic respiratory failure (HCC) Subjectively patient reports feeling much better. Does voice concern for her ability to care for herself at home. Working with TOC on SNF, suspect she will need to d/Walter with some supplemental O2. Stable on 2L at this time.  - Continue Unasyn (2/24-3/5) and minocycline (2/27-3/5) - BiPAP as needed, wean oxygen as tolerated, maintain SPO2 >88% - Incentive spirometry and flutter valve - Out of bed as able - Continue PT - TOC following for placement   Bacteremia Actinetobacter calcoaceticus/baumannii bacteremia.  ID following, and recommended adding minocycline. Plan for 10 day total course of Abx per ID. If discharges can switch Unasyn to ciprofloxacin and continue minocycline as is. -Ceftriaxone and azithromycin (2/23) -Unasyn 3 g every 6 hours (2/24-3/5) -Minocycline 200 mg twice daily (2/27-3/5)  HFpEF 70-75% S/P 40 mg Lasix 2/29. Urine output 1.7 L yesterday. Weight 205 > 199.7 lbs today. Respiratory status improving. LE edema improved from yesterdays exam.  - Strict I/Os - Daily weights - Daily BMP, Mg  Diabetes mellitus, type II ([place]) General trend is improving, though had an isolated high last night to 354.  -Semglee 30units daily  -sSSI -CBG monitoring qAC and qHS       FEN/GI: Dysphagia 3 PPx: Lovenox Dispo:SNF in 1-2 days. Barriers include bed placement and  insurance auth.   Subjective:  Patient reports feeling well. Did not use BiPAP overnight. Voices concerns for her ability to care for herself independently at home but also shares that she doesn't want to "end up in a nursing home--you'll die."   Objective: Temp:  [98 F (36.7 Walter)-98.3 F (36.8 Walter)] 98.1 F (36.7 Walter) (03/02 0447) Pulse Rate:  [57-105] 66 (03/02 0500) Resp:  [16-20] 20 (03/02 0447) BP: (127-157)/(48-64) 146/64 (03/02 0447) SpO2:  [91 %-98 %] 97 % (03/02 0500) Weight:  [90.6 kg] 90.6 kg (03/02 0447) Physical Exam: General: Chronically ill appearing but in good spirits, NAD Cardiovascular: RRR without murmur, trace pedal edema bilaterally  Respiratory:  Normal WOB on 2L [place], lungs clear to auscultation anteriorly Abdomen: Non-tender, non-distended Extremities: Trace edema as above, otherwise without deformity  Laboratory: Most recent CBC Lab Results  Component Value Date   WBC 10.9 (H) 02/18/2023   HGB 10.5 (L) 02/18/2023   HCT 31.3 (L) 02/18/2023   MCV 90.5 02/18/2023   PLT 256 02/18/2023   Most recent BMP    Latest Ref Rng & Units 02/18/2023    5:48 AM  BMP  Glucose 70 - 99 mg/dL 121   BUN 8 - 23 mg/dL 26   Creatinine 0.44 - 1.00 mg/dL 0.78   Sodium 135 - 145 mmol/L 139   Potassium 3.5 - 5.1 mmol/L 3.8   Chloride 98 - 111 mmol/L 104   CO2 22 - 32 mmol/L 26   Calcium 8.9 - 10.3 mg/dL 8.5      Imaging/Diagnostic Tests: No new imaging, tests.   Eppie Gibson, MD 02/19/2023,  6:01 AM  PGY-2, [place] Intern pager: [phone], text pages welcome Secure chat group [place]

## 2023-02-19 NOTE — Plan of Care (Signed)
  Problem: Education: Goal: Ability to demonstrate management of disease process will improve Outcome: Progressing Goal: Ability to verbalize understanding of medication therapies will improve Outcome: Progressing Goal: Individualized Educational Video(s) Outcome: Progressing   Problem: Activity: Goal: Capacity to carry out activities will improve Outcome: Progressing   Problem: Cardiac: Goal: Ability to achieve and maintain adequate cardiopulmonary perfusion will improve Outcome: Progressing   Problem: Health Behavior/Discharge Planning: Goal: Ability to identify and utilize available resources and services will improve Outcome: Progressing Goal: Ability to manage health-related needs will improve Outcome: Progressing   Problem: Clinical Measurements: Goal: Respiratory complications will improve Outcome: Progressing   Problem: Coping: Goal: Level of anxiety will decrease Outcome: Progressing   Problem: Pain Managment: Goal: General experience of comfort will improve Outcome: Progressing   Problem: Safety: Goal: Ability to remain free from injury will improve Outcome: Progressing   Problem: Skin Integrity: Goal: Risk for impaired skin integrity will decrease Outcome: Progressing

## 2023-02-20 DIAGNOSIS — J9601 Acute respiratory failure with hypoxia: Secondary | ICD-10-CM | POA: Diagnosis not present

## 2023-02-20 DIAGNOSIS — I5032 Chronic diastolic (congestive) heart failure: Secondary | ICD-10-CM | POA: Diagnosis not present

## 2023-02-20 DIAGNOSIS — J189 Pneumonia, unspecified organism: Secondary | ICD-10-CM | POA: Diagnosis not present

## 2023-02-20 LAB — GLUCOSE, CAPILLARY
Glucose-Capillary: 203 mg/dL — ABNORMAL HIGH (ref 70–99)
Glucose-Capillary: 200 mg/dL — ABNORMAL HIGH (ref 70–99)
Glucose-Capillary: 177 mg/dL — ABNORMAL HIGH (ref 70–99)
Glucose-Capillary: 286 mg/dL — ABNORMAL HIGH (ref 70–99)

## 2023-02-20 LAB — CBC
HCT: 30.7 % — ABNORMAL LOW (ref 36.0–46.0)
Hemoglobin: 10.4 g/dL — ABNORMAL LOW (ref 12.0–15.0)
MCH: 30.4 pg (ref 26.0–34.0)
MCHC: 33.9 g/dL (ref 30.0–36.0)
MCV: 89.8 fL (ref 80.0–100.0)
Platelets: 254 10*3/uL (ref 150–400)
RBC: 3.42 MIL/uL — ABNORMAL LOW (ref 3.87–5.11)
RDW: 12.8 % (ref 11.5–15.5)
WBC: 11.2 10*3/uL — ABNORMAL HIGH (ref 4.0–10.5)
nRBC: 0 % (ref 0.0–0.2)

## 2023-02-20 LAB — BASIC METABOLIC PANEL
Anion gap: 7 (ref 5–15)
BUN: 25 mg/dL — ABNORMAL HIGH (ref 8–23)
CO2: 27 mmol/L (ref 22–32)
Calcium: 8.6 mg/dL — ABNORMAL LOW (ref 8.9–10.3)
Chloride: 103 mmol/L (ref 98–111)
Creatinine, Ser: 0.9 mg/dL (ref 0.44–1.00)
GFR, Estimated: >60 mL/min (ref >60)
Glucose, Bld: 186 mg/dL — ABNORMAL HIGH (ref 70–99)
Potassium: 4.2 mmol/L (ref 3.5–5.1)
Sodium: 137 mmol/L (ref 135–145)

## 2023-02-20 MED ORDER — LOPERAMIDE HCL 2 MG PO CAPS
2.0000 mg | ORAL_CAPSULE | ORAL | Status: DC | PRN
  Filled 2023-02-20: qty 1

## 2023-02-20 NOTE — Progress Notes (Signed)
Daily Progress Note Intern Pager: [phone]  Patient name: Rebecca Walter record number: [id number] Date of birth: 1941-09-23 Age: 82 y.o. Gender: female  Primary Care Provider: [name], MD Consultants: PCCM s/o, ID s/o Code Status: DNR  Pt Overview and Major Events to Date:  2/23: admitted 2/27: code status changed to DNR  Assessment and Plan:  Rebecca Walter is an 82yo female who presented with SOB and was found to have a pneumonia with superimposed Acinetobacter bacteremia and CHF exacerbation. Pertinent PMH/PSH includes HFpEF, DM2.  Respiratory status improving. Patient now weaned to 2L O2 and stable. Dispo pending SNF placement and insurance authorization. Currently patient would be medically stable to go pending evaluation for home oxygen.    * Acute hypoxemic respiratory failure (HCC) Subjectively patient reports feeling much better. Suspect she will need to d/c with some supplemental O2.  - Continue Unasyn (2/24-3/5) and minocycline (2/27-3/5) - BiPAP as needed, wean oxygen as tolerated, maintain SPO2 >88% - Incentive spirometry and flutter valve - Out of bed as able - Continue PT - TOC following for placement  - Evaluate for home oxygen requirement   Bacteremia Actinetobacter calcoaceticus/baumannii bacteremia.  ID recommended 10 days of Unasyn + minocycline. Will finish course on 3/5. If discharges can switch Unasyn to ciprofloxacin and continue minocycline as is. - s/p Ceftriaxone and azithromycin (2/23) - Unasyn 3 g every 6 hours (2/24-3/5) - Minocycline 200 mg twice daily (2/27-3/5)  HFpEF 70-75% Weight stable at 90.6kg. Euvolemic on exam. Respiratory status continues to improve. - Cont Lasix to '40mg'$  PO daily  - Strict I/Os - Daily weights - Daily BMP, Mg  Diabetes mellitus, type II ([place]) Stable with some elevations, required 13 units SAI 3/2 -Semglee 30units daily  -sSSI -CBG monitoring qAC and qHS   FEN/GI: Dysphagia 3  PPx: Lovenox   Dispo:SNF  pending bed placement . Barriers include insurance auth and placement.   Subjective:  Patient resting comfortably. No acute events overnight. Reports breathing is improving.   Objective: Temp:  [97.9 F (36.6 C)-98.2 F (36.8 C)] 98.1 F (36.7 C) (03/03 0305) Pulse Rate:  [63-67] 63 (03/03 0305) Resp:  [16-20] 18 (03/03 0305) BP: (130-162)/(55-69) 130/67 (03/03 0305) SpO2:  [93 %-95 %] 94 % (03/03 0305) FiO2 (%):  [28 %] 28 % (03/02 2025) Weight:  [90.8 kg] 90.8 kg (03/03 0305) Physical Exam: Frail-appearing, no acute distress Cardio: Regular rate, regular rhythm, no murmurs on exam. Pulm: No increased work of breathing, better air movement in lower lobes, coarse breath sounds.  Abdominal: bowel sounds present, soft, non-tender, non-distended Extremities: no peripheral edema  Neuro: alert and oriented x3, speech normal in content, no facial asymmetry, strength intact and equal bilaterally in UE and LE, pupils equal and reactive to light.    Laboratory: Most recent CBC Lab Results  Component Value Date   WBC 11.2 (H) 02/20/2023   HGB 10.4 (L) 02/20/2023   HCT 30.7 (L) 02/20/2023   MCV 89.8 02/20/2023   PLT 254 02/20/2023   Most recent BMP    Latest Ref Rng & Units 02/20/2023    3:01 AM  BMP  Glucose 70 - 99 mg/dL 186   BUN 8 - 23 mg/dL 25   Creatinine 0.44 - 1.00 mg/dL 0.90   Sodium 135 - 145 mmol/L 137   Potassium 3.5 - 5.1 mmol/L 4.2   Chloride 98 - 111 mmol/L 103   CO2 22 - 32 mmol/L 27   Calcium 8.9 - 10.3  mg/dL 8.6    Rebecca Current, DO 02/20/2023, 8:07 AM  PGY-1, [place] Intern pager: [phone], text pages welcome Secure chat group [place]

## 2023-02-20 NOTE — Plan of Care (Signed)
  Problem: Education: Goal: Knowledge of General Education information will improve Description: Including pain rating scale, medication(s)/side effects and non-pharmacologic comfort measures Outcome: Progressing   Problem: Health Behavior/Discharge Planning: Goal: Ability to manage health-related needs will improve Outcome: Progressing   Problem: Clinical Measurements: Goal: Respiratory complications will improve Outcome: Progressing   Problem: Clinical Measurements: Goal: Cardiovascular complication will be avoided Outcome: Progressing   Problem: Activity: Goal: Risk for activity intolerance will decrease Outcome: Progressing   Problem: Nutrition: Goal: Adequate nutrition will be maintained Outcome: Progressing   Problem: Coping: Goal: Level of anxiety will decrease Outcome: Progressing   Problem: Pain Managment: Goal: General experience of comfort will improve Outcome: Progressing   Problem: Safety: Goal: Ability to remain free from injury will improve Outcome: Progressing   Problem: Skin Integrity: Goal: Risk for impaired skin integrity will decrease Outcome: Progressing   

## 2023-02-20 NOTE — Progress Notes (Signed)
Refused Cpap.

## 2023-02-20 NOTE — Progress Notes (Signed)
Pt currently off bipap at this time. 

## 2023-02-20 NOTE — Progress Notes (Signed)
Mobility Specialist Progress Note:   02/20/23 1100  Mobility  Activity Ambulated with assistance in room (5 STS)  Level of Assistance Standby assist, set-up cues, supervision of patient - no hands on  Assistive Device Front wheel walker  Distance Ambulated (ft) 12 ft  Activity Response Tolerated well  $Mobility charge 1 Mobility   Pt in chair willing to ambulate a little in the room. No complaints of pain. After walking 12 ft pt able to complete 5 STS. Left in chair with call bell in reach and all needs met.   Gareth Eagle Kylin Genna Mobility Specialist Please contact via [employer] or  Rehab Office at [phone]

## 2023-02-20 NOTE — Progress Notes (Signed)
FMTS Interim Progress Note  Spoke with Dr. Baxter Flattery with ID about ordering C diff testing due to the patient having multiple episodes of soft stools for the last 2-3 days with IV antibiotics. Per Dr. Baxter Flattery, the soft stools are most likely antibiotic mediated due to the absence of diffuse watery stools and no severe abdominal discomfort.  Also discussed treatment for the patient's diarrhea and Dr. Baxter Flattery approved treatment with Imodium. Order placed and plan communicated with nursing.   Darci Current, DO 02/20/2023, 12:01 PM PGY-1, [place] [facility] Service pager [phone]

## 2023-02-21 DIAGNOSIS — I5032 Chronic diastolic (congestive) heart failure: Secondary | ICD-10-CM | POA: Diagnosis not present

## 2023-02-21 DIAGNOSIS — J9601 Acute respiratory failure with hypoxia: Secondary | ICD-10-CM | POA: Diagnosis not present

## 2023-02-21 DIAGNOSIS — J189 Pneumonia, unspecified organism: Secondary | ICD-10-CM | POA: Diagnosis not present

## 2023-02-21 LAB — BASIC METABOLIC PANEL
Anion gap: 12 (ref 5–15)
BUN: 25 mg/dL — ABNORMAL HIGH (ref 8–23)
CO2: 23 mmol/L (ref 22–32)
Calcium: 8.6 mg/dL — ABNORMAL LOW (ref 8.9–10.3)
Chloride: 103 mmol/L (ref 98–111)
Creatinine, Ser: 0.89 mg/dL (ref 0.44–1.00)
GFR, Estimated: >60 mL/min (ref >60)
Glucose, Bld: 146 mg/dL — ABNORMAL HIGH (ref 70–99)
Potassium: 4.1 mmol/L (ref 3.5–5.1)
Sodium: 138 mmol/L (ref 135–145)

## 2023-02-21 LAB — CBC
HCT: 30.7 % — ABNORMAL LOW (ref 36.0–46.0)
Hemoglobin: 10.5 g/dL — ABNORMAL LOW (ref 12.0–15.0)
MCH: 30.6 pg (ref 26.0–34.0)
MCHC: 34.2 g/dL (ref 30.0–36.0)
MCV: 89.5 fL (ref 80.0–100.0)
Platelets: 248 10*3/uL (ref 150–400)
RBC: 3.43 MIL/uL — ABNORMAL LOW (ref 3.87–5.11)
RDW: 13 % (ref 11.5–15.5)
WBC: 11.1 10*3/uL — ABNORMAL HIGH (ref 4.0–10.5)
nRBC: 0 % (ref 0.0–0.2)

## 2023-02-21 LAB — GLUCOSE, CAPILLARY
Glucose-Capillary: 247 mg/dL — ABNORMAL HIGH (ref 70–99)
Glucose-Capillary: 170 mg/dL — ABNORMAL HIGH (ref 70–99)
Glucose-Capillary: 223 mg/dL — ABNORMAL HIGH (ref 70–99)
Glucose-Capillary: 228 mg/dL — ABNORMAL HIGH (ref 70–99)
Glucose-Capillary: 170 mg/dL — ABNORMAL HIGH (ref 70–99)

## 2023-02-21 NOTE — TOC Progression Note (Signed)
Transition of Care [facility]) - Progression Note    Patient Details  Name: Rebecca Walter MRN: [id number] Date of Birth: [birth year]  Transition [facility]) CM/SW [employer], [place] Phone Number: 02/21/2023, 10:43 AM  Clinical Narrative:     CSW spoke with patient and patients son regarding SNF bed offers. Patient accepted SNF bed offer with [facility]. CSW spoke with Loree Fee with [facility] who confirmed facility will start insurance authorization for patient. MD informed CSW that facility can set up cpap outpatient for patient.CSW informed Whitney with [facility]. CSW will continue to follow and assist with patients dc planning needs.        Expected Discharge Plan and Services                                               Social Determinants of Health (SDOH) Interventions SDOH Screenings   Food Insecurity: No Food Insecurity (02/13/2023)  Housing: Low Risk  (02/13/2023)  Transportation Needs: No Transportation Needs (02/13/2023)  Utilities: Not At Risk (02/13/2023)  Alcohol Screen: Low Risk  (11/17/2021)  Depression (PHQ2-9): Low Risk  (02/10/2023)  Financial Resource Strain: Low Risk  (11/17/2021)  Physical Activity: Inactive (11/17/2021)  Social Connections: Moderately Integrated (11/17/2021)  Stress: No Stress Concern Present (11/17/2021)  Tobacco Use: Low Risk  (02/11/2023)    Readmission Risk Interventions     No data to display

## 2023-02-21 NOTE — Progress Notes (Signed)
Physical Therapy Treatment Patient Details Name: Rebecca Walter MRN: [id number] DOB: Jul 18, 1941 Today's Date: 02/21/2023   History of Present Illness Rebecca Walter is an 82 y.o. female presenting with shortness of breath, found to have CHF exacerbation and PNA. PMH significant for HFpEF, HTN, HLD, T2DM, GERD, Gout    PT Comments    Pt tolerated treatment well today. Pt was able to progress gait today in hallway with increased distance and less assistance. Pt is likely to DC to SNF in the next day or 2. No change in DC/DME recs. PT will continue to follow.   Recommendations for follow up therapy are one component of a multi-disciplinary discharge planning process, led by the attending physician.  Recommendations may be updated based on patient status, additional functional criteria and insurance authorization.  Follow Up Recommendations  Skilled nursing-short term rehab (<3 hours/day) Can patient physically be transported by private vehicle: Yes   Assistance Recommended at Discharge Frequent or constant Supervision/Assistance  Patient can return home with the following A little help with walking and/or transfers;A little help with bathing/dressing/bathroom   Equipment Recommendations  None recommended by PT (Per accepting facility)    Recommendations for Other Services       Precautions / Restrictions Precautions Precautions: Fall Precaution Comments: watch SpO2 Restrictions Weight Bearing Restrictions: No     Mobility  Bed Mobility               General bed mobility comments: Pt received up in chair upon PT arrival    Transfers Overall transfer level: Needs assistance Equipment used: Rolling walker (2 wheels) Transfers: Sit to/from Stand Sit to Stand: Supervision                Ambulation/Gait Ambulation/Gait assistance: Min guard Gait Distance (Feet): 50 Feet Assistive device: Rolling walker (2 wheels) Gait Pattern/deviations: Antalgic, Decreased stance  time - left, Decreased stride length, Trunk flexed Gait velocity: decr     General Gait Details: Cues for proximity to RW, breathing technique   Stairs             Wheelchair Mobility    Modified Rankin (Stroke Patients Only)       Balance Overall balance assessment: No apparent balance deficits (not formally assessed)                                          Cognition Arousal/Alertness: Awake/alert Behavior During Therapy: WFL for tasks assessed/performed Overall Cognitive Status: Within Functional Limits for tasks assessed                                          Exercises      General Comments General comments (skin integrity, edema, etc.): VSS on 2L. O2 sat dropped to 90% however recovered to 100% on 2L once seated.      Pertinent Vitals/Pain Pain Assessment Pain Assessment: No/denies pain    Home Living                          Prior Function            PT Goals (current goals can now be found in the care plan section) Progress towards PT goals: Progressing toward goals    Frequency  Min 2X/week      PT Plan Current plan remains appropriate    Co-evaluation              AM-PAC PT "6 Clicks" Mobility   Outcome Measure  Help needed turning from your back to your side while in a flat bed without using bedrails?: A Little Help needed moving from lying on your back to sitting on the side of a flat bed without using bedrails?: A Lot Help needed moving to and from a bed to a chair (including a wheelchair)?: A Little Help needed standing up from a chair using your arms (e.g., wheelchair or bedside chair)?: A Little Help needed to walk in hospital room?: A Little Help needed climbing 3-5 steps with a railing? : A Lot 6 Click Score: 16    End of Session Equipment Utilized During Treatment: Oxygen;Gait belt Activity Tolerance: Patient tolerated treatment well Patient left: in chair;with call  bell/phone within reach;with chair alarm set Nurse Communication: Mobility status PT Visit Diagnosis: Other abnormalities of gait and mobility (R26.89);Muscle weakness (generalized) (M62.81)     Time: [id number] PT Time Calculation (min) (ACUTE ONLY): 25 min  Charges:  $Gait Training: 8-22 mins $Therapeutic Activity: 8-22 mins                     Shelby Mattocks, PT, DPT Acute Rehab Services [id number]    Viann Shove 02/21/2023, 12:37 PM

## 2023-02-21 NOTE — Progress Notes (Signed)
Daily Progress Note Intern Pager: [phone]  Patient name: Rebecca Walter record number: [id number] Date of birth: [birth year] Age: 82 y.o. Gender: female  Primary Care Provider: [name], MD Consultants: PCCM s/o, ID s/o Code Status: Full code   Pt Overview and Major Events to Date:  2/23: Admitted 2/27: Code status changed to DNR 3/3: Patient medically stable for discharge   Assessment and Plan:  Rebecca Walter is an 82yo female who presented with SOB and was found to have a pneumonia with superimposed Acinetobacter bacteremia and CHF exacerbation. Pertinent PMH/PSH includes HFpEF, DM2.  Patient is medically stable for discharge to SNF.  Will work on obtaining DME order for home oxygen.  Per patient, son is touring SNF and should make a decision today or tomorrow on facility.   * Acute hypoxemic respiratory failure (HCC) Improving. Anticipating discharge with home O2.  - Continue Unasyn (2/24-3/5) and minocycline (2/27-3/5) - Wean oxygen as tolerated, maintain SPO2 >88% - Incentive spirometry and flutter valve - Out of bed as able - Continue PT - TOC following for placement  - Evaluate for home oxygen requirement   Bacteremia Actinetobacter calcoaceticus/baumannii bacteremia.  ID recommended 10 days of Unasyn + minocycline. Will finish course on 3/5. If discharges can switch Unasyn to ciprofloxacin and continue minocycline as is. - s/p Ceftriaxone and azithromycin (2/23) - Unasyn 3 g every 6 hours (2/24-3/5) - Minocycline 200 mg twice daily (2/27-3/5)  HFpEF 70-75% Weight stable. Euvolemic on exam. Respiratory status improving. - Cont Lasix to '40mg'$  PO daily  - Strict I/Os - Daily weights - Daily BMP, Mg  Diabetes mellitus, type II (HCC) Stable with some elevations, required 10 units SAI 3/3 -Semglee 30units daily  -sSSI -CBG monitoring qAC and qHS   FEN/GI: Dysphagia 3 PPx: Lovenox Dispo:SNF today. Barriers include bed placement and insurance  authorization.   Subjective:  No acute events overnight, patient resting comfortably, no acute complaints.  Objective: Temp:  [97.8 F (36.6 C)-98.9 F (37.2 C)] 97.9 F (36.6 C) (03/04 0755) Pulse Rate:  [50-64] 64 (03/04 0755) Resp:  [16-20] 16 (03/04 0755) BP: (139-152)/(45-77) 151/49 (03/04 0755) SpO2:  [91 %-96 %] 91 % (03/04 0755) Weight:  [85.5 kg] 85.5 kg (03/04 0519) Physical Exam: Chronically ill-appearing, no acute distress Cardio: Regular rate, regular rhythm, no murmurs on exam. Pulm: No increased work of breathing, coarse breath sounds, better air movement in the lung bases Abdominal: bowel sounds present, soft, non-tender, non-distended Extremities: no peripheral edema  Neuro: alert and oriented x3, speech normal in content, no facial asymmetry, strength intact and equal bilaterally in UE and LE, pupils equal and reactive to light.    Laboratory: Most recent CBC Lab Results  Component Value Date   WBC 11.1 (H) 02/21/2023   HGB 10.5 (L) 02/21/2023   HCT 30.7 (L) 02/21/2023   MCV 89.5 02/21/2023   PLT 248 02/21/2023   Most recent BMP    Latest Ref Rng & Units 02/21/2023    2:15 AM  BMP  Glucose 70 - 99 mg/dL 146   BUN 8 - 23 mg/dL 25   Creatinine 0.44 - 1.00 mg/dL 0.89   Sodium 135 - 145 mmol/L 138   Potassium 3.5 - 5.1 mmol/L 4.1   Chloride 98 - 111 mmol/L 103   CO2 22 - 32 mmol/L 23   Calcium 8.9 - 10.3 mg/dL 8.6    Rebecca Current, DO 02/21/2023, 8:01 AM  PGY-1, [place] Intern pager:  [phone], text pages welcome Secure chat group [place]

## 2023-02-22 DIAGNOSIS — R197 Diarrhea, unspecified: Secondary | ICD-10-CM | POA: Diagnosis not present

## 2023-02-22 DIAGNOSIS — E785 Hyperlipidemia, unspecified: Secondary | ICD-10-CM | POA: Diagnosis not present

## 2023-02-22 DIAGNOSIS — J80 Acute respiratory distress syndrome: Secondary | ICD-10-CM | POA: Diagnosis not present

## 2023-02-22 DIAGNOSIS — M109 Gout, unspecified: Secondary | ICD-10-CM | POA: Diagnosis not present

## 2023-02-22 DIAGNOSIS — F419 Anxiety disorder, unspecified: Secondary | ICD-10-CM | POA: Diagnosis not present

## 2023-02-22 DIAGNOSIS — E669 Obesity, unspecified: Secondary | ICD-10-CM | POA: Diagnosis not present

## 2023-02-22 DIAGNOSIS — K5900 Constipation, unspecified: Secondary | ICD-10-CM | POA: Diagnosis not present

## 2023-02-22 DIAGNOSIS — G4700 Insomnia, unspecified: Secondary | ICD-10-CM | POA: Diagnosis not present

## 2023-02-22 DIAGNOSIS — F329 Major depressive disorder, single episode, unspecified: Secondary | ICD-10-CM | POA: Diagnosis not present

## 2023-02-22 DIAGNOSIS — I10 Essential (primary) hypertension: Secondary | ICD-10-CM | POA: Diagnosis not present

## 2023-02-22 DIAGNOSIS — J9611 Chronic respiratory failure with hypoxia: Secondary | ICD-10-CM | POA: Diagnosis not present

## 2023-02-22 DIAGNOSIS — Z794 Long term (current) use of insulin: Secondary | ICD-10-CM | POA: Diagnosis not present

## 2023-02-22 DIAGNOSIS — E118 Type 2 diabetes mellitus with unspecified complications: Secondary | ICD-10-CM | POA: Diagnosis not present

## 2023-02-22 DIAGNOSIS — R41841 Cognitive communication deficit: Secondary | ICD-10-CM | POA: Diagnosis not present

## 2023-02-22 DIAGNOSIS — Z743 Need for continuous supervision: Secondary | ICD-10-CM | POA: Diagnosis not present

## 2023-02-22 DIAGNOSIS — N179 Acute kidney failure, unspecified: Secondary | ICD-10-CM | POA: Diagnosis not present

## 2023-02-22 DIAGNOSIS — J9601 Acute respiratory failure with hypoxia: Secondary | ICD-10-CM | POA: Diagnosis not present

## 2023-02-22 DIAGNOSIS — R0602 Shortness of breath: Secondary | ICD-10-CM | POA: Diagnosis not present

## 2023-02-22 DIAGNOSIS — H04129 Dry eye syndrome of unspecified lacrimal gland: Secondary | ICD-10-CM | POA: Diagnosis not present

## 2023-02-22 DIAGNOSIS — R69 Illness, unspecified: Secondary | ICD-10-CM | POA: Diagnosis not present

## 2023-02-22 DIAGNOSIS — A4154 Sepsis due to Acinetobacter baumannii: Secondary | ICD-10-CM | POA: Diagnosis not present

## 2023-02-22 DIAGNOSIS — I5030 Unspecified diastolic (congestive) heart failure: Secondary | ICD-10-CM | POA: Diagnosis not present

## 2023-02-22 DIAGNOSIS — R531 Weakness: Secondary | ICD-10-CM | POA: Diagnosis not present

## 2023-02-22 DIAGNOSIS — K219 Gastro-esophageal reflux disease without esophagitis: Secondary | ICD-10-CM | POA: Diagnosis not present

## 2023-02-22 DIAGNOSIS — I5032 Chronic diastolic (congestive) heart failure: Secondary | ICD-10-CM | POA: Diagnosis not present

## 2023-02-22 DIAGNOSIS — Z7401 Bed confinement status: Secondary | ICD-10-CM | POA: Diagnosis not present

## 2023-02-22 DIAGNOSIS — E119 Type 2 diabetes mellitus without complications: Secondary | ICD-10-CM | POA: Diagnosis not present

## 2023-02-22 DIAGNOSIS — R7881 Bacteremia: Secondary | ICD-10-CM | POA: Diagnosis not present

## 2023-02-22 DIAGNOSIS — E871 Hypo-osmolality and hyponatremia: Secondary | ICD-10-CM | POA: Diagnosis not present

## 2023-02-22 DIAGNOSIS — J189 Pneumonia, unspecified organism: Secondary | ICD-10-CM | POA: Diagnosis not present

## 2023-02-22 DIAGNOSIS — M6281 Muscle weakness (generalized): Secondary | ICD-10-CM | POA: Diagnosis not present

## 2023-02-22 DIAGNOSIS — R279 Unspecified lack of coordination: Secondary | ICD-10-CM | POA: Diagnosis not present

## 2023-02-22 DIAGNOSIS — E039 Hypothyroidism, unspecified: Secondary | ICD-10-CM | POA: Diagnosis not present

## 2023-02-22 DIAGNOSIS — J9690 Respiratory failure, unspecified, unspecified whether with hypoxia or hypercapnia: Secondary | ICD-10-CM | POA: Diagnosis not present

## 2023-02-22 DIAGNOSIS — G4733 Obstructive sleep apnea (adult) (pediatric): Secondary | ICD-10-CM | POA: Diagnosis not present

## 2023-02-22 DIAGNOSIS — J9691 Respiratory failure, unspecified with hypoxia: Secondary | ICD-10-CM | POA: Diagnosis not present

## 2023-02-22 LAB — GLUCOSE, CAPILLARY
Glucose-Capillary: 129 mg/dL — ABNORMAL HIGH (ref 70–99)
Glucose-Capillary: 174 mg/dL — ABNORMAL HIGH (ref 70–99)

## 2023-02-22 MED ORDER — FUROSEMIDE 40 MG PO TABS: 40.0000 mg | ORAL_TABLET | Freq: Every day | ORAL | Status: DC

## 2023-02-22 MED ORDER — SPIRONOLACTONE 25 MG PO TABS: 25.0000 mg | ORAL_TABLET | Freq: Every day | ORAL | Status: DC

## 2023-02-22 MED ORDER — CIPROFLOXACIN HCL 500 MG PO TABS
500.0000 mg | ORAL_TABLET | Freq: Once | ORAL | Status: AC
  Administered 2023-02-22: 500 mg via ORAL
  Filled 2023-02-22: qty 1

## 2023-02-22 MED ORDER — MINOCYCLINE HCL 50 MG PO CAPS
200.0000 mg | ORAL_CAPSULE | Freq: Two times a day (BID) | ORAL | Status: AC
  Administered 2023-02-22: 200 mg via ORAL
  Filled 2023-02-22: qty 4

## 2023-02-22 MED ORDER — GUAIFENESIN ER 600 MG PO TB12: 1200.0000 mg | ORAL_TABLET | Freq: Two times a day (BID) | ORAL | Status: DC | PRN

## 2023-02-22 MED ORDER — HYDROXYZINE HCL 10 MG PO TABS: 10.0000 mg | ORAL_TABLET | Freq: Three times a day (TID) | ORAL | 0 refills | Status: DC | PRN

## 2023-02-22 MED ORDER — PRAVASTATIN SODIUM 40 MG PO TABS: 40.0000 mg | ORAL_TABLET | Freq: Every day | ORAL | Status: DC

## 2023-02-22 NOTE — Discharge Summary (Signed)
[facility] Discharge Summary  Patient name: Rebecca Walter Medical record number: [id number] Date of birth: 06/01/1941 Age: 82 y.o. Gender: female Date of Admission: 02/11/2023  Date of Discharge: 02/22/23  Admitting Physician: Leslie Dales, DO  Primary Care Provider: [name], MD Consultants: CCM, ID  Indication for Hospitalization: Acute Hypoxic respiratory failure   Discharge Diagnoses/Problem List:  Principal Problem for Admission: Bacteremia  Other Problems addressed during stay:  Principal Problem:   Acute hypoxemic respiratory failure ([place]) Active Problems:   Bacteremia   HFpEF 70-75%   Diabetes mellitus, type II ([place])   Pneumonia of both lower lobes due to infectious organism   SOB (shortness of breath)   Multifocal pneumonia   Sepsis due to Acinetobacter baumannii with acute hypoxic respiratory failure without septic shock [facility])   Coronavirus infection    Brief Hospital Course:  Rebecca Walter is an 82 y.o. female presenting with shortness of breath.  Her hospital course outlined below.  Acute hypoxemic respiratory failure (Rebecca Walter) Patient required BiPAP in the ED on presentation due to SpO2 in mid 70s and increased work of breathing.  Etiology was initially believed to be CAP as well as subacute HFpEF exacerbation.  Patient started on ceftriaxone/azithromycin for CAP (see pneumonia below for details) and received IV Lasix for diuresis (see HFpEF below for details).  On 2/24 blood cultures were positive for Acinetobacter baumannii (see bacteremia below).  Patient's respiratory status remained tenuous between 2/24 and 2/28.   In addition to management for diagnoses above, 3-day IV methylprednisone course was initiated (2/26 - 2/28).  Repeat CT chest with contrast on 02/15/2023 showed concern of worsening respiratory disease on 02/15/2023-pulmonology was consulted (see pneumonia below).  On 02/16/2023 patient had improved respiratory status,  thought to be in combination of antibiotic regimen/steroids/ambulation/diuresis.  On 02/17/2023 patient was able to remain on high flow nasal cannula during day, needing BiPAP only at night.  After workup believe acute hypoxic respiratory failure was multifactorial: Acinetobacter bacteremia, viral pneumonia, fluid retention 2/2 CHF.  Discharged to SNF with 2L oxygen and BiPAP nightly.  Bacteremia Acinetobacter bacteremia.  ID initially curb sided on 02/12/2023 who recommended discontinuing ceftriaxone/azithromycin and starting Unasyn.  Culture showed sensitivity to Unasyn.  Antibiotic start date to include Unasyn, exclude ceftriaxone/azithromycin.  Despite Unasyn treatment, patient was not improving and respiratory status.  Per ID minocycline 200 mg twice daily was added for additional coverage.  Respiratory panel positive for coronavirus OC 43.  MRSA swab negative.  ID recommended 10-day total course of antibiotics (2/24 - 3/6).  Unasyn transitioned to ciprofloxacin and minocycline on 3/5 for one additional dose prior to discharge to complete full antibiotic course.   Pneumonia of both lower lobes due to infectious organism Initial CXR showing patchy opacities in right and left lung bases suspicious for pneumonia, with possible trace pleural effusions.  Ceftriaxone/azithromycin initiated on 2/23 for CAP.  1 blood culture came back positive for Acinetobacter, she was transitioned to Unasyn.  Despite transition of antibiotic, patient's respiratory status remained tenuous.  Repeat CXR and CT chest with contrast showed worsening respiratory disease.  Pulmonology consulted on 02/15/2023.  Per pulmonology, effusions were too small to drain.  Recommended additional coverage with minocycline (see above), adding flutter valve, ambulating patient.  She was a poor candidate for mechanical ventilation, and after discussion CODE STATUS was transitioned to DNR by her choice on 02/15/2023.  Fortunately, patient's respiratory  status significantly improved on 02/17/2023-maintaining SpO2 and normal work of breathing on  5L HFNC, with BiPAP only at night.  HFpEF 70-75% BNP initially elevated to 277.7, with +2 BLE pitting edema.  Suspected subacute HFpEF exacerbation, and was diuresed with 40 mg IV Lasix at presentation.  Echocardiogram on 02/11/2023 showed LVEF of 70-25% with grade 2 diastolic dysfunction.  Patient was diuresed with IV Lasix intermittently throughout hospitalization for fluid retention.  Patient appeared euvolemic on 02/18/2023. Continued on 40 mg Lasix daily.   Other chronic conditions were medically managed with home medications and formulary alternatives as necessary (T2DM)  Follow-up recommendations Recommend checking CBC and temperature, looking for signs of infection.  Recommend diuresis daily with 40 mg Lasix.  Disposition: SNF  Discharge Condition: stable  Discharge Exam:  Vitals:   02/22/23 1125 02/22/23 1145  BP:  (!) 133/49  Pulse:  63  Resp:  18  Temp:  97.9 F (36.6 C)  SpO2: 95% 90%   Chronically ill-appearing, no acute distress Cardio: Regular rate, regular rhythm, no murmurs on exam. Pulm: Coarse breath sounds, better air movement bilaterally, improved from prior exam. No increased work of breathing Abdominal: bowel sounds present, soft, non-tender, non-distended Extremities: no peripheral edema  Neuro: alert and oriented x3, speech normal in content, no facial asymmetry  Significant Procedures: none   Significant Labs and Imaging:  Recent Labs  Lab 02/21/23 0215  WBC 11.1*  HGB 10.5*  HCT 30.7*  PLT 248   Recent Labs  Lab 02/21/23 0215  NA 138  K 4.1  CL 103  CO2 23  GLUCOSE 146*  BUN 25*  CREATININE 0.89  CALCIUM 8.6*    Echo: LVEF 70-75%; hyperdynamic function, no wall motion abnormalities, mild concentric LVH, G2DD  CXR 2/22: 1. Patchy airspace opacities at the right greater than left lung bases, suspicious for pneumonia. Followup PA and lateral  chest X-ray is recommended in 3-4 weeks following trial of antibiotic therapy to ensure resolution and exclude underlying malignancy. 2. Possible trace pleural effusions.  CXR 2/23: Increasing bilateral lung base opacity, suspicious for Bilateral Pneumonia in this clinical setting. Aspiration and pleural effusions are also differential considerations. No evidence of pulmonary edema.  CXR 2/26: 1. Dense heterogeneous airspace and consolidation of the bilateral lung bases, with increased airspace opacity and consolidation of the peripheral right upper lobe. Findings are consistent with worsened multifocal infection. 2. Gross cardiomegaly.  CT Chest W Contrast:  1. Dense consolidative airspace disease in the posterior right upper lobe, central right middle lobe and posterior right lower lobe. Collapse/consolidation noted left lower lobe. Imaging features are compatible with multifocal pneumonia. 2. Small to moderate right and tiny left pleural effusions. 3. Enlargement of the pulmonary outflow tract/main pulmonary arteries suggests pulmonary arterial hypertension. 4.  Aortic Atherosclerosis (ICD10-I70.0).  Results/Tests Pending at Time of Discharge: None  Discharge Medications:  Allergies as of 02/22/2023       Reactions   Baclofen Other (See Comments)   Caused leg weakness   Hydrochlorothiazide Other (See Comments)   Uric acid elevation and gout.      Liraglutide Other (See Comments)   Tongue Glossitus   Losartan Potassium    Other reaction(s): diarrhea   Lotensin [benazepril] Other (See Comments)   Dry cough   Benazepril Hcl    Other reaction(s): dry cough   Metoprolol Succinate [metoprolol]    Other reaction(s): coughing   Beta Adrenergic Blockers Other (See Comments)   Occurred with metoprolol  coughing        Medication List     STOP taking these medications  azithromycin 250 MG tablet Commonly known as: ZITHROMAX   baclofen 10 MG tablet Commonly  known as: LIORESAL   cefdinir 300 MG capsule Commonly known as: OMNICEF   diclofenac Sodium 1 % Gel Commonly known as: Voltaren   famotidine 20 MG tablet Commonly known as: PEPCID   hydrocortisone cream 1 % Commonly known as: Preparation H   ibuprofen 200 MG tablet Commonly known as: ADVIL   lidocaine 2 % solution Commonly known as: XYLOCAINE   OVER THE COUNTER MEDICATION   torsemide 10 MG tablet Commonly known as: DEMADEX       TAKE these medications    acetaminophen 325 MG tablet Commonly known as: TYLENOL Take 325 mg by mouth every 8 (eight) hours as needed (pain).   allopurinol 100 MG tablet Commonly known as: ZYLOPRIM Take 2 tablets (200 mg total) by mouth daily. What changed: how much to take   amLODipine 10 MG tablet Commonly known as: NORVASC Take 1 tablet (10 mg total) by mouth daily. What changed: Another medication with the same name was removed. Continue taking this medication, and follow the directions you see here.   aspirin 81 MG chewable tablet Chew 1 tablet (81 mg total) by mouth daily.   busPIRone 15 MG tablet Commonly known as: BUSPAR TAKE 1 TABLET BY MOUTH TWICE A DAY   furosemide 40 MG tablet Commonly known as: LASIX Take 1 tablet (40 mg total) by mouth daily.   guaiFENesin 600 MG 12 hr tablet Commonly known as: MUCINEX Take 2 tablets (1,200 mg total) by mouth 2 (two) times daily as needed.   hydrOXYzine 10 MG tablet Commonly known as: ATARAX Take 1 tablet (10 mg total) by mouth 3 (three) times daily as needed for anxiety.   insulin aspart 100 UNIT/ML FlexTouch Pen Commonly known as: FIASP INJECT 15 UNITS SUBCUTANEOUSLY DAILY WITH BREAKFAST, INJECT 9 UNITS WITH LUNCH AS NEEDED FOR CBG 180 OR MORE, AND INJECT 9 UNITS WITH SUPPER What changed:  how much to take how to take this when to take this reasons to take this additional instructions   Lantus SoloStar 100 UNIT/ML Solostar Pen Generic drug: insulin glargine Inject 64  Units into the skin daily.   levothyroxine 50 MCG tablet Commonly known as: SYNTHROID Take 1 tablet (50 mcg total) by mouth every morning. 30 minutes before food   loperamide 2 MG tablet Commonly known as: IMODIUM A-D Take 4 mg by mouth as needed for diarrhea or loose stools.   metoCLOPramide 5 MG tablet Commonly known as: REGLAN 1 TABLET BEFORE MEALS What changed: See the new instructions.   Multi Vitamin Tabs Take 1 tablet by mouth daily.   OneTouch Delica Lancets 99991111 Misc Please use to check blood sugar up to 4 times daily. E11.9   pantoprazole 40 MG tablet Commonly known as: PROTONIX TAKE 1 TABLET BY MOUTH TWICE A DAY   polyethylene glycol powder 17 GM/SCOOP powder Commonly known as: GLYCOLAX/MIRALAX Take 17 g by mouth daily. What changed:  when to take this reasons to take this   pravastatin 40 MG tablet Commonly known as: PRAVACHOL Take 1 tablet (40 mg total) by mouth daily at 6 PM.   sertraline 100 MG tablet Commonly known as: ZOLOFT Take 1 tablet (100 mg total) by mouth daily. What changed:  when to take this additional instructions   sertraline 50 MG tablet Commonly known as: ZOLOFT TAKE 1 TABLET IN THE AFTERNOON, ONE DOSE AT BEDTIME What changed:  how much to take how to  take this when to take this additional instructions   spironolactone 25 MG tablet Commonly known as: ALDACTONE Take 1 tablet (25 mg total) by mouth daily.   triamcinolone ointment 0.5 % Commonly known as: KENALOG Apply 1 Application topically 2 (two) times daily.   valsartan 320 MG tablet Commonly known as: DIOVAN TAKE 1 TABLET BY MOUTH EVERY DAY   Vitamin E 400 units Tabs Take 400 Units by mouth daily.               Durable Medical Equipment  (From admission, onward)           Start     Ordered   02/21/23 0943  For home use only DME oxygen  Once       Question Answer Comment  Length of Need 6 Months   Mode or (Route) Nasal cannula   Liters per Minute 3    Frequency Continuous (stationary and portable oxygen unit needed)   Oxygen delivery system Gas      02/21/23 0942            Discharge Instructions: Please refer to Patient Instructions section of EMR for full details.  Patient was counseled important signs and symptoms that should prompt return to medical care, changes in medications, dietary instructions, activity restrictions, and follow up appointments.   Follow-Up Appointments:  Contact information for follow-up providers     [name], MD. Go on 03/01/2023.   Specialty: Family Medicine Why: Appointment at 9:50 am, please arrive at least 15 min prior to your scheduled appointment time. Contact information: [address] [place] [address] [phone]              Contact information for after-discharge care     Destination     [facility] Preferred SNF .   Service: Skilled Nursing Contact information: [place], [place], [place] 02/22/2023, 12:19 PM PGY-1, [place]

## 2023-02-22 NOTE — Progress Notes (Signed)
Report given to [place] at [facility]. Awaiting PTAR for transport.

## 2023-02-22 NOTE — TOC Progression Note (Signed)
Transition of Care [facility]) - Progression Note    Patient Details  Name: Rebecca Walter MRN: [id number] Date of Birth: Mar 25, 1941  Transition of Care [facility]) CM/SW [place], [place] Phone Number: 02/22/2023, 9:50 AM  Clinical Narrative:     CSW informed by MD patient will need Bipap at SNF. CSW spoke with Roselie Awkward in Respiratory and received bipap settings for patient 16/6 at 28%. CSW provided [place] with [facility] Bipap settings. [facility] informed CSW that they have Bipap at facility. [facility] informed CSW that insurance authorization for patient has been approved. CSW informed MD. CSW will continue to follow and assist with patients dc planning needs.       Expected Discharge Plan and Services                                               Social Determinants of Health (SDOH) Interventions SDOH Screenings   Food Insecurity: No Food Insecurity (02/13/2023)  Housing: Low Risk  (02/13/2023)  Transportation Needs: No Transportation Needs (02/13/2023)  Utilities: Not At Risk (02/13/2023)  Alcohol Screen: Low Risk  (11/17/2021)  Depression (PHQ2-9): Low Risk  (02/10/2023)  Financial Resource Strain: Low Risk  (11/17/2021)  Physical Activity: Inactive (11/17/2021)  Social Connections: Moderately Integrated (11/17/2021)  Stress: No Stress Concern Present (11/17/2021)  Tobacco Use: Low Risk  (02/11/2023)    Readmission Risk Interventions     No data to display

## 2023-02-22 NOTE — TOC Transition Note (Signed)
Transition of Care [facility]) - CM/SW Discharge Note   Patient Details  Name: Rebecca Walter MRN: [id number] Date of Birth: Oct 11, 1941  [facility]) CM/SW Contact:  Milas Gain, [place] Phone Number: 02/22/2023, 12:50 PM   Clinical Narrative:     [facility] will [facility] to: [facility]  Anticipated DC date: 02/22/2023  Family notified: [name]  Transport by: Corey Harold  ?  Per MD patient ready for DC to [facility] . RN, patient, patient's family, and facility notified of DC.Facility confirmed Bipap is at facility. Discharge Summary sent to facility. RN given number for report tele# [phone] RM# [id number]. DC packet on chart. DNR signed by MD attached to patients DC packet. Ambulance transport requested for patient.  CSW signing off.   Final next level of care: Skilled Nursing Facility Barriers to Discharge: No Barriers Identified   Patient Goals and CMS Choice CMS Medicare.gov Compare Post Acute Care list provided to:: Patient Choice offered to / list presented to : Patient, Adult Children (Patient and patients son)  Discharge Placement                Patient chooses bed at:  [facility]) Patient to be transferred to facility by: [place] Name of family member notified: [name] Patient and family notified of of transfer: 02/22/23  Discharge Plan and Services Additional resources added to the After Visit Summary for                                       Social Determinants of Health (SDOH) Interventions SDOH Screenings   Food Insecurity: No Food Insecurity (02/13/2023)  Housing: Low Risk  (02/13/2023)  Transportation Needs: No Transportation Needs (02/13/2023)  Utilities: Not At Risk (02/13/2023)  Alcohol Screen: Low Risk  (11/17/2021)  Depression (PHQ2-9): Low Risk  (02/10/2023)  Financial Resource Strain: Low Risk  (11/17/2021)  Physical Activity: Inactive (11/17/2021)  Social Connections: Moderately Integrated (11/17/2021)  Stress: No Stress Concern Present  (11/17/2021)  Tobacco Use: Low Risk  (02/11/2023)     Readmission Risk Interventions     No data to display

## 2023-02-22 NOTE — Progress Notes (Signed)
Occupational Therapy Treatment Patient Details Name: Rebecca Walter MRN: [id number] DOB: 1941/07/30 Today's Date: 02/22/2023   History of present illness Rebecca Walter is an 82 y.o. female presenting with shortness of breath, found to have CHF exacerbation and PNA. PMH significant for HFpEF, HTN, HLD, T2DM, GERD, Gout   OT comments  Pt was seen for OT ADL retraining session with focus on functional transfers (Min guard assist using RW) to 3:1 in room. Pt able to maintain O2 at 90-92% on 2L via [place]. Pt was Mod assistance for hygeine after loose stool on 3:1. Pt then transferred to chair using RW at close supervision-min guard assist level. When seated, pt O2 dropped to 88% while she was talking. She was able to return O2 to 92% on 2L via [place] after verbal education to perform pursed lip breathing techniques. Pt completed grooming, UB bathing with set-up - min A. She plans to d/c to SNF Rehab later today if medically able.   Recommendations for follow up therapy are one component of a multi-disciplinary discharge planning process, led by the attending physician.  Recommendations may be updated based on patient status, additional functional criteria and insurance authorization.    Follow Up Recommendations  Skilled nursing-short term rehab (<3 hours/day)     Assistance Recommended at Discharge Frequent or constant Supervision/Assistance  Patient can return home with the following  A little help with walking and/or transfers;Assistance with cooking/housework;Assist for transportation;Help with stairs or ramp for entrance;A little help with bathing/dressing/bathroom   Equipment Recommendations  Other (comment) (Defer to next venue)    Recommendations for Other Services      Precautions / Restrictions Precautions Precautions: Fall Precaution Comments: watch SpO2 Restrictions Weight Bearing Restrictions: No       Mobility Bed Mobility     General bed mobility comments: Pt received up in  chair upon OT arrival    Transfers Overall transfer level: Needs assistance Equipment used: Rolling walker (2 wheels) Transfers: Sit to/from Stand, Bed to chair/wheelchair/BSC Sit to Stand: Supervision, Min guard     Step pivot transfers: Supervision, Min guard     General transfer comment: assist for initial rise from chair, transition to min guard for safety with transfer to/from 3:1 and back to recliner chair     Balance Overall balance assessment: No apparent balance deficits (not formally assessed) Sitting-balance support: Feet supported, Feet unsupported Sitting balance-Leahy Scale: Good     Standing balance support: Bilateral upper extremity supported, During functional activity, Reliant on assistive device for balance Standing balance-Leahy Scale: Good       ADL either performed or assessed with clinical judgement   ADL Overall ADL's : Needs assistance/impaired Eating/Feeding: Independent;Sitting   Grooming: Set up;Sitting;Wash/dry face;Oral care;Wash/dry hands   Upper Body Bathing: Minimal assistance;Sitting;Set up               Toilet Transfer: Min guard;Stand-pivot;Ambulation;BSC/3in1;Rolling walker (2 wheels)   Toileting- Clothing Manipulation and Hygiene: Minimal assistance;Moderate assistance;Sit to/from stand;Sitting/lateral lean Toileting - Clothing Manipulation Details (indicate cue type and reason): Pt with loose BM on 3:1, Mod A to for peri care after standing using RW     Functional mobility during ADLs: Min guard;Rolling walker (2 wheels) General ADL Comments: Pt was seen for OT ADL retraining session with focus on functional transfers (Min guard assist using RW) to 3:1 in room. Pt able to maintain O2 at 90-92% on 2L via [place]. Pt was Mod assistance for hygeine after loose stool on 3:1. Pt then transferred  to chair using RW at close supervision-min guard assist level. When seated, pt O2 dropped to 88% while she was talking. She was able to return O2  to 92% on 2L via [place] after verbal education to perform pursed lip breathing techniques. Pt completed grooming, UB bathing with set-up - min A. She plans to d/c to SNF Rehab later today if medically able.    Extremity/Trunk Assessment Upper Extremity Assessment Upper Extremity Assessment: Overall WFL for tasks assessed   Lower Extremity Assessment Lower Extremity Assessment: Defer to PT evaluation   Cervical / Trunk Assessment Cervical / Trunk Assessment: Kyphotic    Vision Baseline Vision/History: 0 No visual deficits Patient Visual Report: No change from baseline Vision Assessment?: No apparent visual deficits          Cognition Arousal/Alertness: Awake/alert Behavior During Therapy: WFL for tasks assessed/performed Overall Cognitive Status: Within Functional Limits for tasks assessed               General Comments Pt with O2 at 90-92% on 2L O2 via [place] except after transferring from 3:1 back to chair while talking (O2 dipped to 88-89%), pt able to recover quickly to 92% with pursed lip breathing techniques, but she required the verbal cues to do so.    Pertinent Vitals/ Pain       Pain Assessment Pain Assessment: No/denies pain  Home Living  Refer to initial Eval - lives alone    Prior Functioning/Environment   Refer to initial Eval   Frequency  Min 2X/week        Progress Toward Goals  OT Goals(current goals can now be found in the care plan section)  Progress towards OT goals: Progressing toward goals  Acute Rehab OT Goals Patient Stated Goal: Go to Rehab later if able OT Goal Formulation: With patient Time For Goal Achievement: 02/27/23 Potential to Achieve Goals: Good  Plan Discharge plan remains appropriate       AM-PAC OT "6 Clicks" Daily Activity     Outcome Measure   Help from another person eating meals?: None Help from another person taking care of personal grooming?: A Little Help from another person toileting, which includes using toliet,  bedpan, or urinal?: A Little Help from another person bathing (including washing, rinsing, drying)?: A Lot Help from another person to put on and taking off regular upper body clothing?: A Little Help from another person to put on and taking off regular lower body clothing?: A Lot 6 Click Score: 17    End of Session Equipment Utilized During Treatment: Rolling walker (2 wheels)  OT Visit Diagnosis: Unsteadiness on feet (R26.81);Other abnormalities of gait and mobility (R26.89);Muscle weakness (generalized) (M62.81)   Activity Tolerance Patient tolerated treatment well   Patient Left in chair;with call bell/phone within reach;with nursing/sitter in room   Nurse Communication Other (comment) (Pt with loose BM on 3:1)        Time: [id number] OT Time Calculation (min): 27 min  Charges: OT General Charges $OT Visit: 1 Visit OT Treatments $Self Care/Home Management : 23-37 mins  Derinda Bartus Beth Dixon, OTR/L 02/22/2023, 9:58 AM

## 2023-02-22 NOTE — Progress Notes (Signed)
Physical Therapy Treatment Patient Details Name: Rebecca Walter MRN: [id number] DOB: 02-23-1941 Today's Date: 02/22/2023   History of Present Illness Rebecca Walter is an 82 y.o. female presenting with shortness of breath, found to have CHF exacerbation and PNA. PMH significant for HFpEF, HTN, HLD, T2DM, GERD, Gout    PT Comments    Patient received in recliner she is agreeable to PT session. Patient reports B knee pain. She requires min guard for all mobility, but is limited by pain, fatigue and sob. O2 sats down to 86% after ambulation 50 feet. She was able to return to 95% with seated rest and pursed lip breathing. Patient will continue to benefit from skilled PT to improve strength, activity tolerance and safety.     Recommendations for follow up therapy are one component of a multi-disciplinary discharge planning process, led by the attending physician.  Recommendations may be updated based on patient status, additional functional criteria and insurance authorization.  Follow Up Recommendations  Skilled nursing-short term rehab (<3 hours/day) Can patient physically be transported by private vehicle: Yes   Assistance Recommended at Discharge Frequent or constant Supervision/Assistance  Patient can return home with the following A little help with walking and/or transfers;A little help with bathing/dressing/bathroom   Equipment Recommendations  None recommended by PT    Recommendations for Other Services       Precautions / Restrictions Precautions Precautions: Fall Precaution Comments: watch SpO2 Restrictions Weight Bearing Restrictions: No     Mobility  Bed Mobility               General bed mobility comments: Pt received up in chair upon arrival    Transfers Overall transfer level: Needs assistance Equipment used: Rolling walker (2 wheels) Transfers: Sit to/from Stand Sit to Stand: Min guard                Ambulation/Gait Ambulation/Gait assistance:  Min guard Gait Distance (Feet): 50 Feet Assistive device: Rolling walker (2 wheels) Gait Pattern/deviations: Antalgic, Decreased stance time - left, Decreased stride length, Trunk flexed Gait velocity: decr     General Gait Details: Cues for proximity to RW, breathing technique, limited by knee pain and sob.   Stairs             Wheelchair Mobility    Modified Rankin (Stroke Patients Only)       Balance Overall balance assessment: Mild deficits observed, not formally tested Sitting-balance support: Feet supported Sitting balance-Leahy Scale: Good     Standing balance support: Bilateral upper extremity supported, During functional activity, Reliant on assistive device for balance Standing balance-Leahy Scale: Good                              Cognition Arousal/Alertness: Awake/alert Behavior During Therapy: WFL for tasks assessed/performed Overall Cognitive Status: Within Functional Limits for tasks assessed                                          Exercises      General Comments General comments (skin integrity, edema, etc.): Pt with O2 at 90-92% on 2L O2 via [place] except after transferring from 3:1 back to chair while talking (O2 dipped to 88-89%), pt able to recover quickly to 92% with pursed lip breathing techniques, but she required the verbal cues to do so.  Pertinent Vitals/Pain Pain Assessment Pain Assessment: Faces Faces Pain Scale: Hurts little more Pain Location: knees Pain Descriptors / Indicators: Discomfort Pain Intervention(s): Monitored during session, Repositioned    Home Living Family/patient expects to be discharged to:: Skilled nursing facility Living Arrangements: Alone Available Help at Discharge: Personal care attendant;Available PRN/intermittently Type of Home: Apartment                  Prior Function            PT Goals (current goals can now be found in the care plan section) Acute Rehab  PT Goals PT Goal Formulation: With patient Time For Goal Achievement: 02/27/23 Potential to Achieve Goals: Good Progress towards PT goals: Progressing toward goals    Frequency    Min 2X/week      PT Plan Current plan remains appropriate    Co-evaluation              AM-PAC PT "6 Clicks" Mobility   Outcome Measure  Help needed turning from your back to your side while in a flat bed without using bedrails?: A Little Help needed moving from lying on your back to sitting on the side of a flat bed without using bedrails?: A Lot Help needed moving to and from a bed to a chair (including a wheelchair)?: A Little Help needed standing up from a chair using your arms (e.g., wheelchair or bedside chair)?: A Little Help needed to walk in hospital room?: A Little Help needed climbing 3-5 steps with a railing? : A Lot 6 Click Score: 16    End of Session Equipment Utilized During Treatment: Gait belt;Oxygen Activity Tolerance: Patient limited by pain;Patient limited by fatigue Patient left: in chair;with call bell/phone within reach Nurse Communication: Mobility status PT Visit Diagnosis: Other abnormalities of gait and mobility (R26.89);Muscle weakness (generalized) (M62.81);Pain Pain - Right/Left:  (B) Pain - part of body: Knee     Time: [id number] PT Time Calculation (min) (ACUTE ONLY): 31 min  Charges:  $Gait Training: 23-37 mins                     Van Seymore, PT, GCS 02/22/23,11:31 AM

## 2023-02-23 ENCOUNTER — Other Ambulatory Visit: Payer: Self-pay | Admitting: Family Medicine

## 2023-02-23 DIAGNOSIS — J189 Pneumonia, unspecified organism: Secondary | ICD-10-CM | POA: Diagnosis not present

## 2023-02-23 DIAGNOSIS — I5030 Unspecified diastolic (congestive) heart failure: Secondary | ICD-10-CM | POA: Diagnosis not present

## 2023-02-23 DIAGNOSIS — I340 Nonrheumatic mitral (valve) insufficiency: Secondary | ICD-10-CM

## 2023-02-23 DIAGNOSIS — E119 Type 2 diabetes mellitus without complications: Secondary | ICD-10-CM | POA: Diagnosis not present

## 2023-02-23 DIAGNOSIS — E039 Hypothyroidism, unspecified: Secondary | ICD-10-CM | POA: Diagnosis not present

## 2023-02-23 DIAGNOSIS — F419 Anxiety disorder, unspecified: Secondary | ICD-10-CM | POA: Diagnosis not present

## 2023-02-23 DIAGNOSIS — M109 Gout, unspecified: Secondary | ICD-10-CM | POA: Diagnosis not present

## 2023-02-23 DIAGNOSIS — G4700 Insomnia, unspecified: Secondary | ICD-10-CM | POA: Diagnosis not present

## 2023-02-23 DIAGNOSIS — I10 Essential (primary) hypertension: Secondary | ICD-10-CM | POA: Diagnosis not present

## 2023-02-24 ENCOUNTER — Telehealth: Payer: Self-pay

## 2023-02-24 DIAGNOSIS — R7881 Bacteremia: Secondary | ICD-10-CM | POA: Diagnosis not present

## 2023-02-24 DIAGNOSIS — E119 Type 2 diabetes mellitus without complications: Secondary | ICD-10-CM | POA: Diagnosis not present

## 2023-02-24 DIAGNOSIS — I5030 Unspecified diastolic (congestive) heart failure: Secondary | ICD-10-CM | POA: Diagnosis not present

## 2023-02-24 DIAGNOSIS — J189 Pneumonia, unspecified organism: Secondary | ICD-10-CM | POA: Diagnosis not present

## 2023-02-24 DIAGNOSIS — I10 Essential (primary) hypertension: Secondary | ICD-10-CM | POA: Diagnosis not present

## 2023-02-24 DIAGNOSIS — J9690 Respiratory failure, unspecified, unspecified whether with hypoxia or hypercapnia: Secondary | ICD-10-CM | POA: Diagnosis not present

## 2023-02-24 NOTE — Telephone Encounter (Signed)
Returned patient's call as requested. She is currently in SNF for rehab. No concerns in particular. Chatted for a while. Sounds like she is doing well in rehab thus far.   [name], MD

## 2023-02-24 NOTE — Telephone Encounter (Signed)
Patient calls nurse line requesting to reschedule appointment for 3/12 due to still being in rehab.   Appointment canceled. She is asking to speak with Dr. Jeani Hawking. Asked if there was anything I could help her with and she said, "I would just like to speak with Dr. Jeani Hawking."   Forwarding to PCP.   Talbot Grumbling, RN

## 2023-03-01 ENCOUNTER — Ambulatory Visit: Payer: Self-pay | Admitting: Family Medicine

## 2023-03-02 DIAGNOSIS — R197 Diarrhea, unspecified: Secondary | ICD-10-CM | POA: Diagnosis not present

## 2023-03-02 DIAGNOSIS — H04129 Dry eye syndrome of unspecified lacrimal gland: Secondary | ICD-10-CM | POA: Diagnosis not present

## 2023-03-03 DIAGNOSIS — J9690 Respiratory failure, unspecified, unspecified whether with hypoxia or hypercapnia: Secondary | ICD-10-CM | POA: Diagnosis not present

## 2023-03-03 DIAGNOSIS — R197 Diarrhea, unspecified: Secondary | ICD-10-CM | POA: Diagnosis not present

## 2023-03-03 DIAGNOSIS — I10 Essential (primary) hypertension: Secondary | ICD-10-CM | POA: Diagnosis not present

## 2023-03-03 DIAGNOSIS — E039 Hypothyroidism, unspecified: Secondary | ICD-10-CM | POA: Diagnosis not present

## 2023-03-04 DIAGNOSIS — R197 Diarrhea, unspecified: Secondary | ICD-10-CM | POA: Diagnosis not present

## 2023-03-08 ENCOUNTER — Telehealth: Payer: Self-pay

## 2023-03-08 DIAGNOSIS — H04129 Dry eye syndrome of unspecified lacrimal gland: Secondary | ICD-10-CM | POA: Diagnosis not present

## 2023-03-08 DIAGNOSIS — R197 Diarrhea, unspecified: Secondary | ICD-10-CM | POA: Diagnosis not present

## 2023-03-08 DIAGNOSIS — F419 Anxiety disorder, unspecified: Secondary | ICD-10-CM

## 2023-03-08 NOTE — Telephone Encounter (Signed)
Patient calls nurse line requesting to speak with Dr. Jeani Hawking.   She states that she is being discharged from rehab facility on Thursday morning.   She reports having a productive cough. She states that she would like to speak with Dr. Jeani Hawking prior to scheduling follow up appointment.   Please return call to patient at [phone].  Talbot Grumbling, RN

## 2023-03-08 NOTE — Telephone Encounter (Signed)
Patient returns call to nurse line. She is requesting a hospital bed for once she is discharged.   She states that she needs this visit to be virtual.   Please advise if this is appropriate and I can schedule her for next available.   Talbot Grumbling, RN

## 2023-03-08 NOTE — Telephone Encounter (Signed)
Attempted to call patient in order to schedule virtual appointment. She did not answer, LVM asking her to return call to office to schedule.   Talbot Grumbling, RN

## 2023-03-09 DIAGNOSIS — K5900 Constipation, unspecified: Secondary | ICD-10-CM | POA: Diagnosis not present

## 2023-03-09 DIAGNOSIS — R197 Diarrhea, unspecified: Secondary | ICD-10-CM | POA: Diagnosis not present

## 2023-03-09 DIAGNOSIS — M6281 Muscle weakness (generalized): Secondary | ICD-10-CM | POA: Diagnosis not present

## 2023-03-09 DIAGNOSIS — I5030 Unspecified diastolic (congestive) heart failure: Secondary | ICD-10-CM | POA: Diagnosis not present

## 2023-03-09 DIAGNOSIS — E119 Type 2 diabetes mellitus without complications: Secondary | ICD-10-CM | POA: Diagnosis not present

## 2023-03-09 DIAGNOSIS — M109 Gout, unspecified: Secondary | ICD-10-CM | POA: Diagnosis not present

## 2023-03-09 DIAGNOSIS — J9611 Chronic respiratory failure with hypoxia: Secondary | ICD-10-CM | POA: Diagnosis not present

## 2023-03-09 DIAGNOSIS — F419 Anxiety disorder, unspecified: Secondary | ICD-10-CM | POA: Diagnosis not present

## 2023-03-09 DIAGNOSIS — J189 Pneumonia, unspecified organism: Secondary | ICD-10-CM | POA: Diagnosis not present

## 2023-03-09 DIAGNOSIS — G4733 Obstructive sleep apnea (adult) (pediatric): Secondary | ICD-10-CM | POA: Diagnosis not present

## 2023-03-09 DIAGNOSIS — I10 Essential (primary) hypertension: Secondary | ICD-10-CM | POA: Diagnosis not present

## 2023-03-09 MED ORDER — HYDROXYZINE HCL 10 MG PO TABS: 10.0000 mg | ORAL_TABLET | Freq: Three times a day (TID) | ORAL | 0 refills | Status: DC | PRN

## 2023-03-09 NOTE — Telephone Encounter (Signed)
Returned call to patient as requested.   She reports that she no longer needs a virtual appointment and orders for DME equipment for when she returns home from [facility], expected to be tomorrow (Thursday). She says their DME company does not need this per her SW at the rehab facility.   She reports her PT experience has been wonderful. She feels stronger. She reports they have set up home PT to come to her.   She is going home to a new apartment - confirmed correct address in her chart. Son and DIL have set it all up for her.   She confirms her mobile phone is the best contact number.  Requests refill of hydroxyzine, sent to her favorite pharmacy.   Appointment changed per patient request, both time (earlier) and from video to in-person appointment. Now for Tues 3/26 at 1:50 pm.   [name], MD

## 2023-03-09 NOTE — Addendum Note (Signed)
Addended by: Renard Hamper on: 03/09/2023 05:19 PM   Modules accepted: Orders

## 2023-03-10 ENCOUNTER — Other Ambulatory Visit: Payer: Self-pay | Admitting: Family Medicine

## 2023-03-10 DIAGNOSIS — I10 Essential (primary) hypertension: Secondary | ICD-10-CM | POA: Diagnosis not present

## 2023-03-10 DIAGNOSIS — Z794 Long term (current) use of insulin: Secondary | ICD-10-CM | POA: Diagnosis not present

## 2023-03-10 DIAGNOSIS — I5030 Unspecified diastolic (congestive) heart failure: Secondary | ICD-10-CM | POA: Diagnosis not present

## 2023-03-10 DIAGNOSIS — M109 Gout, unspecified: Secondary | ICD-10-CM | POA: Diagnosis not present

## 2023-03-10 DIAGNOSIS — J9611 Chronic respiratory failure with hypoxia: Secondary | ICD-10-CM | POA: Diagnosis not present

## 2023-03-10 DIAGNOSIS — F419 Anxiety disorder, unspecified: Secondary | ICD-10-CM | POA: Diagnosis not present

## 2023-03-10 DIAGNOSIS — R7881 Bacteremia: Secondary | ICD-10-CM | POA: Diagnosis not present

## 2023-03-10 DIAGNOSIS — J189 Pneumonia, unspecified organism: Secondary | ICD-10-CM | POA: Diagnosis not present

## 2023-03-10 DIAGNOSIS — G4733 Obstructive sleep apnea (adult) (pediatric): Secondary | ICD-10-CM | POA: Diagnosis not present

## 2023-03-10 DIAGNOSIS — E039 Hypothyroidism, unspecified: Secondary | ICD-10-CM | POA: Diagnosis not present

## 2023-03-10 DIAGNOSIS — E871 Hypo-osmolality and hyponatremia: Secondary | ICD-10-CM | POA: Diagnosis not present

## 2023-03-10 DIAGNOSIS — M6281 Muscle weakness (generalized): Secondary | ICD-10-CM | POA: Diagnosis not present

## 2023-03-10 DIAGNOSIS — R197 Diarrhea, unspecified: Secondary | ICD-10-CM | POA: Diagnosis not present

## 2023-03-15 ENCOUNTER — Ambulatory Visit (INDEPENDENT_AMBULATORY_CARE_PROVIDER_SITE_OTHER): Payer: Medicare HMO | Admitting: Family Medicine

## 2023-03-15 ENCOUNTER — Encounter: Payer: Self-pay | Admitting: Family Medicine

## 2023-03-15 ENCOUNTER — Telehealth: Payer: Medicare HMO | Admitting: Family Medicine

## 2023-03-15 ENCOUNTER — Ambulatory Visit
Admission: RE | Admit: 2023-03-15 | Discharge: 2023-03-15 | Disposition: A | Discharge status: Home / Self Care | Payer: Medicare HMO | Source: Ambulatory Visit | Attending: Family Medicine | Admitting: Family Medicine

## 2023-03-15 VITALS — BP 134/58 | HR 78 | Ht 60.0 in | Wt 198.8 lb

## 2023-03-15 DIAGNOSIS — R059 Cough, unspecified: Secondary | ICD-10-CM | POA: Diagnosis not present

## 2023-03-15 DIAGNOSIS — J189 Pneumonia, unspecified organism: Secondary | ICD-10-CM | POA: Diagnosis not present

## 2023-03-15 DIAGNOSIS — R6520 Severe sepsis without septic shock: Secondary | ICD-10-CM

## 2023-03-15 DIAGNOSIS — J9601 Acute respiratory failure with hypoxia: Secondary | ICD-10-CM

## 2023-03-15 DIAGNOSIS — E1159 Type 2 diabetes mellitus with other circulatory complications: Secondary | ICD-10-CM | POA: Diagnosis not present

## 2023-03-15 DIAGNOSIS — I152 Hypertension secondary to endocrine disorders: Secondary | ICD-10-CM | POA: Diagnosis not present

## 2023-03-15 DIAGNOSIS — A4154 Sepsis due to Acinetobacter baumannii: Secondary | ICD-10-CM | POA: Diagnosis not present

## 2023-03-15 NOTE — Patient Instructions (Signed)
It was wonderful to see you today. Thank you for allowing me to be a part of your care. Below is a short summary of what we discussed at your visit today:  Cough Today we got blood work-CBC and BMP. I will call you about the results.   I want you to get a chest x-ray.  I will review it before the end of clinic and call you with what I want [place] to do, whether it be antibiotics or other.  Come back for an appointment on Thursday.  Can be with anybody.  Unfortunately do not have any appointments on Thursday.  [facility] [address], [place], [place] [address] [phone] Mon-Fri 8-5     Please bring all of your medications to every appointment!  If you have any questions or concerns, please do not hesitate to contact us via phone or MyChart message.   [name], MD

## 2023-03-15 NOTE — Progress Notes (Signed)
    SUBJECTIVE:   CHIEF COMPLAINT / HPI:   Hospital follow up Hospitalized 2/23 - 3/05 with acinetobacter pneumonia and bacteremia plus respiratory panel positive for coronavirus OC 43. Treated with total 10 day course of antibiotics (mainly Unasyn).  Was discharged to SNF for rehab with PT. Of note, discharged with home oxygen. Discharged home from SNF last Thursday on 3/21. She reports having one good day at home before beginning to experience a cough and worsened dyspnea. She reports the cough has been worsening over the last 4 days. Believes she needs more than the 2L O2 via [place] with any exertion.   PERTINENT  PMH / PSH: T2DM, CKD3, diabetic neuropathy, HTN, HLD, osteopenia, GERD, gastroparesis, hypothyroidism, mild cognitive impairment, BL knee OA   OBJECTIVE:   BP (!) 134/58   Pulse 78   Ht 5' (1.524 m)   Wt 198 lb 12.8 oz (90.2 kg)   LMP  (LMP Unknown)   SpO2 96%   BMI 38.83 kg/m    PHQ-9:     03/17/2023    4:24 PM 03/15/2023    2:24 PM 02/10/2023    2:59 PM  Depression screen PHQ 2/9  Decreased Interest 0 0 0  Down, Depressed, Hopeless 0 0 0  PHQ - 2 Score 0 0 0  Altered sleeping 1 2 3   Tired, decreased energy 0 1 1  Change in appetite 0 0 0  Feeling bad or failure about yourself  0 0 0  Trouble concentrating 0 0 0  Moving slowly or fidgety/restless 0 0 0  Suicidal thoughts 0 0 0  PHQ-9 Score 1 3 4   Difficult doing work/chores  Not difficult at all Not difficult at all     Physical Exam General: Awake, alert, oriented Cardiovascular: Regular rate and rhythm, S1 and S2 present, no murmurs auscultated Respiratory: Lung fields clear to auscultation bilaterally Extremities: No bilateral lower extremity edema, palpable pedal and pretibial pulses bilaterally Neuro: Cranial nerves II through X grossly intact, able to move all extremities spontaneously  ASSESSMENT/PLAN:   Pneumonia of both lower lobes due to infectious organism Worsening cough and dyspnea after  hospitalization for Acinetobacter pneumonia and bacteremia quite concerning. Patient afebrile here in office with clear lung sounds. No respiratory distress. Will collect labs to screen for leukocytosis and obtain CXR. Low threshold for antibiotics. Plan for follow up here in clinic in a couple days. ED precautions given.      [name], MD [place]

## 2023-03-16 ENCOUNTER — Telehealth: Payer: Self-pay | Admitting: Family Medicine

## 2023-03-16 LAB — CBC WITH DIFFERENTIAL/PLATELET
Basophils Absolute: 0 10*3/uL (ref 0.0–0.2)
Basos: 1 %
EOS (ABSOLUTE): 0.4 10*3/uL (ref 0.0–0.4)
Eos: 6 %
Hematocrit: 33.8 % — ABNORMAL LOW (ref 34.0–46.6)
Hemoglobin: 10.8 g/dL — ABNORMAL LOW (ref 11.1–15.9)
Immature Grans (Abs): 0.1 10*3/uL (ref 0.0–0.1)
Immature Granulocytes: 1 %
Lymphocytes Absolute: 1.4 10*3/uL (ref 0.7–3.1)
Lymphs: 22 %
MCH: 28.4 pg (ref 26.6–33.0)
MCHC: 32 g/dL (ref 31.5–35.7)
MCV: 89 fL (ref 79–97)
Monocytes Absolute: 0.8 10*3/uL (ref 0.1–0.9)
Monocytes: 13 %
Neutrophils Absolute: 3.6 10*3/uL (ref 1.4–7.0)
Neutrophils: 57 %
Platelets: 166 10*3/uL (ref 150–450)
RBC: 3.8 x10E6/uL (ref 3.77–5.28)
RDW: 14.5 % (ref 11.7–15.4)
WBC: 6.2 10*3/uL (ref 3.4–10.8)

## 2023-03-16 LAB — BASIC METABOLIC PANEL
BUN/Creatinine Ratio: 25 (ref 12–28)
BUN: 19 mg/dL (ref 8–27)
CO2: 21 mmol/L (ref 20–29)
Calcium: 9 mg/dL (ref 8.7–10.3)
Chloride: 103 mmol/L (ref 96–106)
Creatinine, Ser: 0.75 mg/dL (ref 0.57–1.00)
Glucose: 141 mg/dL — ABNORMAL HIGH (ref 70–99)
Potassium: 3.7 mmol/L (ref 3.5–5.2)
Sodium: 140 mmol/L (ref 134–144)
eGFR: 79 mL/min/{1.73_m2} (ref >59)

## 2023-03-17 ENCOUNTER — Encounter: Payer: Self-pay | Admitting: Family Medicine

## 2023-03-17 ENCOUNTER — Ambulatory Visit (INDEPENDENT_AMBULATORY_CARE_PROVIDER_SITE_OTHER): Payer: Medicare HMO | Admitting: Family Medicine

## 2023-03-17 ENCOUNTER — Other Ambulatory Visit: Payer: Self-pay

## 2023-03-17 ENCOUNTER — Telehealth: Payer: Self-pay | Admitting: Family Medicine

## 2023-03-17 VITALS — BP 126/62 | HR 68 | Ht 60.0 in | Wt 197.0 lb

## 2023-03-17 DIAGNOSIS — J189 Pneumonia, unspecified organism: Secondary | ICD-10-CM | POA: Diagnosis not present

## 2023-03-17 NOTE — Patient Instructions (Addendum)
It was great seeing you today!  I am glad to see you are doing better!  Your chest x ray shows improvement as does your blood work. Your order for oxygen has been placed.   Please check-out at the front desk before leaving the clinic. Recommend following up with your PCP in 2 weeks either in person or virtually but if you need to be seen earlier than that for any new issues we're happy to fit you in, just give [place] a call!  Feel free to call with any questions or concerns at any time, at [phone].   Take care,  Dr. Shary Key [facility]

## 2023-03-17 NOTE — Telephone Encounter (Signed)
CXR not yet read. Called [facility] - unable to reach a human.   [name], MD

## 2023-03-17 NOTE — Progress Notes (Signed)
    SUBJECTIVE:   CHIEF COMPLAINT / HPI:   Patient presents for follow up with her son  Was hospitalized 2/23-3/5 for acute hypoxic respiratory failure, was treated for community-acquired pneumonia.  Blood cultures were positive for Acinetobacter baumannii   Was seen in clinic on 3/26 for follow up and CXR ordered which showed improved R lung opacity compared to prior and mild bibasilar residual opacities likely due to atelectasis vs scarring.   Today still with a cough though notes much improvement. On 2L at home but does not have the oxygen with her. Family asks for me to check on portable oxygen order that was placed at last visit. Denies any other questions or concerns  PERTINENT  PMH / PSH: Reviewed   OBJECTIVE:   BP 126/62   Pulse 68   Ht 5' (1.524 m)   Wt 197 lb (89.4 kg)   LMP  (LMP Unknown)   SpO2 94%   BMI 38.47 kg/m    Physical exam General: well appearing, NAD Cardiovascular: RRR, murmur appreciated  Lungs: Mild diffuse crackles. Normal WOB on RA Abdomen: soft, non-distended, non-tender Skin: warm, dry. No edema  ASSESSMENT/PLAN:   Pneumonia of both lower lobes due to infectious organism Patient presents for follow up from recent pneumonia. Vitals stable and she has remained afebrile. O2 sat 94% on room air though she is typically on 2L O2 at home. CXR much improved from hospitalization and I showed patient this during the visit.BMP abd CBC performed 2 days ago unremarkable. White count improved from 3 weeks ago. Patient has PCP follow up already scheduled. Return precautions discussed.    Patient presents for follow up from recent pneumonia. Vitals stable and she has remained afebrile. O2 sat 94% on room air though she is typically on 2L O2 at home. CXR much improved from hospitalization and I showed patient this during the visit.BMP abd CBC performed 2 days ago unremarkable. White count improved from 3 weeks ago. Patient has PCP follow up already scheduled. Return  precautions discussed.   Rebecca Walter

## 2023-03-17 NOTE — Telephone Encounter (Signed)
CXR read

## 2023-03-18 ENCOUNTER — Telehealth: Payer: Self-pay | Admitting: Family Medicine

## 2023-03-18 NOTE — Telephone Encounter (Signed)
AFTER HOURS EMERGENCY LINE   Sharyn Lull from outpatient OT/PT called for verbal order. Was directed to our after hours line due to clinic closure for religious holiday today.   Patient requests OT/OT to start 4/02. They simply need a verbal order. Verbal order given.   [name], MD

## 2023-03-19 NOTE — Assessment & Plan Note (Signed)
Patient presents for follow up from recent pneumonia. Vitals stable and she has remained afebrile. O2 sat 94% on room air though she is typically on 2L O2 at home. CXR much improved from hospitalization and I showed patient this during the visit.BMP abd CBC performed 2 days ago unremarkable. White count improved from 3 weeks ago. Patient has PCP follow up already scheduled. Return precautions discussed.

## 2023-03-20 NOTE — Assessment & Plan Note (Signed)
Worsening cough and dyspnea after hospitalization for Acinetobacter pneumonia and bacteremia quite concerning. Patient afebrile here in office with clear lung sounds. No respiratory distress. Will collect labs to screen for leukocytosis and obtain CXR. Low threshold for antibiotics. Plan for follow up here in clinic in a couple days. ED precautions given.

## 2023-03-22 ENCOUNTER — Telehealth: Payer: Self-pay

## 2023-03-22 DIAGNOSIS — F419 Anxiety disorder, unspecified: Secondary | ICD-10-CM | POA: Diagnosis not present

## 2023-03-22 DIAGNOSIS — E119 Type 2 diabetes mellitus without complications: Secondary | ICD-10-CM | POA: Diagnosis not present

## 2023-03-22 DIAGNOSIS — R6520 Severe sepsis without septic shock: Secondary | ICD-10-CM | POA: Diagnosis not present

## 2023-03-22 DIAGNOSIS — Z794 Long term (current) use of insulin: Secondary | ICD-10-CM | POA: Diagnosis not present

## 2023-03-22 DIAGNOSIS — E038 Other specified hypothyroidism: Secondary | ICD-10-CM | POA: Diagnosis not present

## 2023-03-22 DIAGNOSIS — J9601 Acute respiratory failure with hypoxia: Secondary | ICD-10-CM | POA: Diagnosis not present

## 2023-03-22 DIAGNOSIS — I5033 Acute on chronic diastolic (congestive) heart failure: Secondary | ICD-10-CM | POA: Diagnosis not present

## 2023-03-22 DIAGNOSIS — Z9981 Dependence on supplemental oxygen: Secondary | ICD-10-CM | POA: Diagnosis not present

## 2023-03-22 DIAGNOSIS — U071 COVID-19: Secondary | ICD-10-CM | POA: Diagnosis not present

## 2023-03-22 DIAGNOSIS — I110 Hypertensive heart disease with heart failure: Secondary | ICD-10-CM | POA: Diagnosis not present

## 2023-03-22 DIAGNOSIS — J1561 Pneumonia due to Acinetobacter baumannii: Secondary | ICD-10-CM | POA: Diagnosis not present

## 2023-03-22 DIAGNOSIS — M1990 Unspecified osteoarthritis, unspecified site: Secondary | ICD-10-CM | POA: Diagnosis not present

## 2023-03-22 NOTE — Telephone Encounter (Signed)
[facility] PT calls nurse line requesting verbal orders for [facility] PT as follows.   1x a week for 1 week  2x a week for 3 weeks  1x a week for 3 weeks   Verbal orders given to [place].   She also reports during exertion the patients O2 sats dropped to 87% while on 2L of O2. She is requesting parameters to increase O2 to 3L while doing therapy if sats drop. Please specify.   She also reports a drug to drug interaction with Sertraline and Metoclopramide.   Will forward to PCP.

## 2023-03-24 NOTE — Addendum Note (Signed)
Addended by: Renard Hamper on: 03/24/2023 09:10 AM   Modules accepted: Orders

## 2023-03-24 NOTE — Telephone Encounter (Signed)
Noted and agree on verbal orders.   PT may increase O2 from 2L to 3L if patient SpO2 persistently below 88% with ambulation (more than 10-20 seconds).   We can stop metoclopramide. Looks like it has been previously discontinued.   [name], MD

## 2023-03-25 ENCOUNTER — Telehealth: Payer: Self-pay

## 2023-03-25 DIAGNOSIS — J9601 Acute respiratory failure with hypoxia: Secondary | ICD-10-CM

## 2023-03-25 NOTE — Telephone Encounter (Signed)
Ordered signed.  Routed back to RN pool.   Fayette Pho, MD

## 2023-03-25 NOTE — Telephone Encounter (Signed)
Patient calls nurse line to check status of POC. Separate order needs to be placed for portable oxygen evaluation and device.   Pended order to this encounter.   Forwarding to PCP.   Veronda Prude, RN

## 2023-03-25 NOTE — Telephone Encounter (Signed)
Community message sent to [employer]. Will await response.   Leilan Bochenek C Jermie Hippe, RN  

## 2023-03-28 DIAGNOSIS — E038 Other specified hypothyroidism: Secondary | ICD-10-CM | POA: Diagnosis not present

## 2023-03-28 DIAGNOSIS — I5033 Acute on chronic diastolic (congestive) heart failure: Secondary | ICD-10-CM | POA: Diagnosis not present

## 2023-03-28 DIAGNOSIS — U071 COVID-19: Secondary | ICD-10-CM | POA: Diagnosis not present

## 2023-03-28 DIAGNOSIS — J1561 Pneumonia due to Acinetobacter baumannii: Secondary | ICD-10-CM | POA: Diagnosis not present

## 2023-03-28 DIAGNOSIS — F419 Anxiety disorder, unspecified: Secondary | ICD-10-CM | POA: Diagnosis not present

## 2023-03-28 DIAGNOSIS — Z794 Long term (current) use of insulin: Secondary | ICD-10-CM | POA: Diagnosis not present

## 2023-03-28 DIAGNOSIS — Z9981 Dependence on supplemental oxygen: Secondary | ICD-10-CM | POA: Diagnosis not present

## 2023-03-28 DIAGNOSIS — M1990 Unspecified osteoarthritis, unspecified site: Secondary | ICD-10-CM | POA: Diagnosis not present

## 2023-03-28 DIAGNOSIS — J9601 Acute respiratory failure with hypoxia: Secondary | ICD-10-CM | POA: Diagnosis not present

## 2023-03-28 DIAGNOSIS — I110 Hypertensive heart disease with heart failure: Secondary | ICD-10-CM | POA: Diagnosis not present

## 2023-03-28 DIAGNOSIS — E119 Type 2 diabetes mellitus without complications: Secondary | ICD-10-CM | POA: Diagnosis not present

## 2023-03-28 NOTE — Telephone Encounter (Signed)
Please see the below message from [employer].   Good morning, We will need the order to state the below to process pt POC.   "If new O2 start,, Please evaluate and titrate for best fit POC or portable O2 system. RT to evaluate and titrate patient for POC or Homefill with OCD maintain sats >/=90%. if patient qualifies, dispense POC or homefill with OCD 1-5 pulse dose."   Thanks   Veronda Prude, RN

## 2023-03-29 ENCOUNTER — Telehealth: Payer: Self-pay

## 2023-03-29 DIAGNOSIS — E119 Type 2 diabetes mellitus without complications: Secondary | ICD-10-CM | POA: Diagnosis not present

## 2023-03-29 DIAGNOSIS — M1990 Unspecified osteoarthritis, unspecified site: Secondary | ICD-10-CM | POA: Diagnosis not present

## 2023-03-29 DIAGNOSIS — I5033 Acute on chronic diastolic (congestive) heart failure: Secondary | ICD-10-CM | POA: Diagnosis not present

## 2023-03-29 DIAGNOSIS — Z9981 Dependence on supplemental oxygen: Secondary | ICD-10-CM | POA: Diagnosis not present

## 2023-03-29 DIAGNOSIS — J9601 Acute respiratory failure with hypoxia: Secondary | ICD-10-CM | POA: Diagnosis not present

## 2023-03-29 DIAGNOSIS — E038 Other specified hypothyroidism: Secondary | ICD-10-CM | POA: Diagnosis not present

## 2023-03-29 DIAGNOSIS — F419 Anxiety disorder, unspecified: Secondary | ICD-10-CM | POA: Diagnosis not present

## 2023-03-29 DIAGNOSIS — U071 COVID-19: Secondary | ICD-10-CM | POA: Diagnosis not present

## 2023-03-29 DIAGNOSIS — Z794 Long term (current) use of insulin: Secondary | ICD-10-CM | POA: Diagnosis not present

## 2023-03-29 DIAGNOSIS — J1561 Pneumonia due to Acinetobacter baumannii: Secondary | ICD-10-CM | POA: Diagnosis not present

## 2023-03-29 DIAGNOSIS — I110 Hypertensive heart disease with heart failure: Secondary | ICD-10-CM | POA: Diagnosis not present

## 2023-03-29 NOTE — Addendum Note (Signed)
Addended by: Valetta Close on: 03/29/2023 06:22 PM   Modules accepted: Orders

## 2023-03-29 NOTE — Telephone Encounter (Signed)
Marcelino Duster [facility] OT calls nurse line requesting verbal orders for [facility] OT as follows.   2x a week for 2 weeks  1x a week for 3 weeks   Verbal orders given.

## 2023-03-29 NOTE — Telephone Encounter (Signed)
Follow up message sent to [employer]. Will await response.   Veronda Prude, RN

## 2023-03-29 NOTE — Telephone Encounter (Signed)
Please see the below message from [employer].   Yes just a order updated/added to state the verbiage below.   ""If new O2 start,, Please evaluate and titrate for best fit POC or portable O2 system. RT to evaluate and titrate patient for POC or Homefill with OCD maintain sats >/=90%. if patient qualifies, dispense POC or homefill with OCD 1-5 pulse dose."   We can then Evaluate/Walk pt,   Thanks!   Veronda Prude, RN

## 2023-03-30 NOTE — Telephone Encounter (Signed)
Community message sent to [employer] with update.   Othelia Riederer C Raedyn Wenke, RN  

## 2023-03-31 DIAGNOSIS — E038 Other specified hypothyroidism: Secondary | ICD-10-CM | POA: Diagnosis not present

## 2023-03-31 DIAGNOSIS — I110 Hypertensive heart disease with heart failure: Secondary | ICD-10-CM | POA: Diagnosis not present

## 2023-03-31 DIAGNOSIS — U071 COVID-19: Secondary | ICD-10-CM | POA: Diagnosis not present

## 2023-03-31 DIAGNOSIS — E119 Type 2 diabetes mellitus without complications: Secondary | ICD-10-CM | POA: Diagnosis not present

## 2023-03-31 DIAGNOSIS — M1990 Unspecified osteoarthritis, unspecified site: Secondary | ICD-10-CM | POA: Diagnosis not present

## 2023-03-31 DIAGNOSIS — I5033 Acute on chronic diastolic (congestive) heart failure: Secondary | ICD-10-CM | POA: Diagnosis not present

## 2023-03-31 DIAGNOSIS — F419 Anxiety disorder, unspecified: Secondary | ICD-10-CM | POA: Diagnosis not present

## 2023-03-31 DIAGNOSIS — J9601 Acute respiratory failure with hypoxia: Secondary | ICD-10-CM | POA: Diagnosis not present

## 2023-03-31 DIAGNOSIS — Z794 Long term (current) use of insulin: Secondary | ICD-10-CM | POA: Diagnosis not present

## 2023-03-31 DIAGNOSIS — J1561 Pneumonia due to Acinetobacter baumannii: Secondary | ICD-10-CM | POA: Diagnosis not present

## 2023-03-31 DIAGNOSIS — Z9981 Dependence on supplemental oxygen: Secondary | ICD-10-CM | POA: Diagnosis not present

## 2023-04-01 ENCOUNTER — Encounter: Payer: Self-pay | Admitting: Family Medicine

## 2023-04-01 ENCOUNTER — Telehealth (INDEPENDENT_AMBULATORY_CARE_PROVIDER_SITE_OTHER): Payer: Medicare HMO | Admitting: Family Medicine

## 2023-04-01 DIAGNOSIS — M7989 Other specified soft tissue disorders: Secondary | ICD-10-CM | POA: Diagnosis not present

## 2023-04-01 DIAGNOSIS — E1159 Type 2 diabetes mellitus with other circulatory complications: Secondary | ICD-10-CM | POA: Diagnosis not present

## 2023-04-01 DIAGNOSIS — R32 Unspecified urinary incontinence: Secondary | ICD-10-CM | POA: Diagnosis not present

## 2023-04-01 DIAGNOSIS — J9601 Acute respiratory failure with hypoxia: Secondary | ICD-10-CM

## 2023-04-01 DIAGNOSIS — I152 Hypertension secondary to endocrine disorders: Secondary | ICD-10-CM

## 2023-04-01 NOTE — Assessment & Plan Note (Signed)
Patient reports home BP 180s/80s on home arm cuff. Reports lots of pain as likely culprit. Discussed reduction of amlodipine from 10 mg to 5 mg for leg swelling, however patient politely declines. Does not think the valsartan works for her. I will not change BP meds until we get her into the office for objective measurements.

## 2023-04-01 NOTE — Assessment & Plan Note (Signed)
Wonder if exacerbated by double diuretics. Needs spironolactone for diagnosis of HFpEF.  - Stop Lasix - Follow-up appointment scheduled for Tuesday 4/16 at 3:10 PM - Patient to take one dose Lasix if she develops 3 pound weight gain or more, worsening leg swelling, or shortness of breath

## 2023-04-01 NOTE — Progress Notes (Addendum)
[facility] Telemedicine Visit  Patient consented to have virtual visit and was identified by name and date of birth. Method of visit: Telephone  Encounter participants: Patient: Rebecca Walter - located at home Provider: Fayette Pho - located at [facility] Others (if applicable): none  Chief Complaint: SOB, acute hypoxic respiratory failure  HPI: SOB, recent pneumonia  Acute hypoxic respiratory failure requiring supplemental oxygen - home SpO2 usually >90% - couple of times dipped lower during exertion, like with PT - using 2L supp O2 intermittently when she feels winded or SOB - not coughing at all any more - improved with mucinex - no fever or chills  Leg swelling - left worse than right leg  - no redness, heat, or pain of left leg - patient thinks since September 2023, chronic issue - per patient, leg circumference difference measured by PT - patient cannot recall measurements but says they are a little different - compression stockings are too tight, patient cannot tolerate anything tight - improved after legs elevated to level of heart for some time - denies orthopnea at bed time  Urine incontinence - on spiro and lasix - cannot hold urine - chronic issue worsened recently  ROS: per HPI  Pertinent PMHx: Acinetobacter pneumonia, bacteremia, coronavirus OC 43, T2DM, CKD3, HFpEF 70-75%, diabetic neuropathy, HTN, HLD, osteopenia, GERD, gastroparesis, hypothyroidism, mild cognitive impairment, BL knee OA   Exam:  LMP  (LMP Unknown)   Respiratory: speaking clearly, no respiratory distress   Assessment/Plan:  Leg swelling Chronic, no red flags. Improved with elevation. Cannot tolerate compression stockings. Discussed amlodipine reduction from 10 mg to 5 mg, patient politely declines. See HTN tab for more.   Hypertension associated with diabetes (HCC) Patient reports home BP 180s/80s on home arm cuff. Reports lots of pain as likely culprit.  Discussed reduction of amlodipine from 10 mg to 5 mg for leg swelling, however patient politely declines. Does not think the valsartan works for her. I will not change BP meds until we get her into the office for objective measurements.   Acute hypoxemic respiratory failure (HCC) Needing intermittent supplemental O2 at 2L via [place]. Using maybe 50% of the day or less. Trying to only use when she is SOB. Has appointment 4/24 for [employer] to evaluate for portable oxygen concentrator for ease in getting out of house. Cough and congestion has improved, now nearly resolved. Will continue with PT, strength training, conditioning, and supplemental O2.   Urinary incontinence Wonder if exacerbated by double diuretics. Needs spironolactone for diagnosis of HFpEF.  - Stop Lasix - Follow-up appointment scheduled for Tuesday 4/16 at 3:10 PM - Patient to take one dose Lasix if she develops 3 pound weight gain or more, worsening leg swelling, or shortness of breath   Time spent during visit with patient: 25 minutes  Fayette Pho, MD  PGY-3 [facility]

## 2023-04-01 NOTE — Assessment & Plan Note (Signed)
Needing intermittent supplemental O2 at 2L via [place]. Using maybe 50% of the day or less. Trying to only use when she is SOB. Has appointment 4/24 for [employer] to evaluate for portable oxygen concentrator for ease in getting out of house. Cough and congestion has improved, now nearly resolved. Will continue with PT, strength training, conditioning, and supplemental O2.

## 2023-04-01 NOTE — Assessment & Plan Note (Addendum)
Chronic, no red flags. Improved with elevation. Cannot tolerate compression stockings. Discussed amlodipine reduction from 10 mg to 5 mg, patient politely declines. See HTN tab for more.

## 2023-04-04 DIAGNOSIS — F419 Anxiety disorder, unspecified: Secondary | ICD-10-CM | POA: Diagnosis not present

## 2023-04-04 DIAGNOSIS — U071 COVID-19: Secondary | ICD-10-CM | POA: Diagnosis not present

## 2023-04-04 DIAGNOSIS — Z794 Long term (current) use of insulin: Secondary | ICD-10-CM | POA: Diagnosis not present

## 2023-04-04 DIAGNOSIS — I110 Hypertensive heart disease with heart failure: Secondary | ICD-10-CM | POA: Diagnosis not present

## 2023-04-04 DIAGNOSIS — Z9981 Dependence on supplemental oxygen: Secondary | ICD-10-CM | POA: Diagnosis not present

## 2023-04-04 DIAGNOSIS — E119 Type 2 diabetes mellitus without complications: Secondary | ICD-10-CM | POA: Diagnosis not present

## 2023-04-04 DIAGNOSIS — I5033 Acute on chronic diastolic (congestive) heart failure: Secondary | ICD-10-CM | POA: Diagnosis not present

## 2023-04-04 DIAGNOSIS — E038 Other specified hypothyroidism: Secondary | ICD-10-CM | POA: Diagnosis not present

## 2023-04-04 DIAGNOSIS — J9601 Acute respiratory failure with hypoxia: Secondary | ICD-10-CM | POA: Diagnosis not present

## 2023-04-04 DIAGNOSIS — J1561 Pneumonia due to Acinetobacter baumannii: Secondary | ICD-10-CM | POA: Diagnosis not present

## 2023-04-04 DIAGNOSIS — M1990 Unspecified osteoarthritis, unspecified site: Secondary | ICD-10-CM | POA: Diagnosis not present

## 2023-04-05 ENCOUNTER — Ambulatory Visit: Payer: Self-pay | Admitting: Family Medicine

## 2023-04-06 DIAGNOSIS — E119 Type 2 diabetes mellitus without complications: Secondary | ICD-10-CM | POA: Diagnosis not present

## 2023-04-06 DIAGNOSIS — Z794 Long term (current) use of insulin: Secondary | ICD-10-CM | POA: Diagnosis not present

## 2023-04-06 DIAGNOSIS — U071 COVID-19: Secondary | ICD-10-CM | POA: Diagnosis not present

## 2023-04-06 DIAGNOSIS — E038 Other specified hypothyroidism: Secondary | ICD-10-CM | POA: Diagnosis not present

## 2023-04-06 DIAGNOSIS — I5033 Acute on chronic diastolic (congestive) heart failure: Secondary | ICD-10-CM | POA: Diagnosis not present

## 2023-04-06 DIAGNOSIS — J9601 Acute respiratory failure with hypoxia: Secondary | ICD-10-CM | POA: Diagnosis not present

## 2023-04-06 DIAGNOSIS — I110 Hypertensive heart disease with heart failure: Secondary | ICD-10-CM | POA: Diagnosis not present

## 2023-04-06 DIAGNOSIS — M1990 Unspecified osteoarthritis, unspecified site: Secondary | ICD-10-CM | POA: Diagnosis not present

## 2023-04-06 DIAGNOSIS — J1561 Pneumonia due to Acinetobacter baumannii: Secondary | ICD-10-CM | POA: Diagnosis not present

## 2023-04-06 DIAGNOSIS — F419 Anxiety disorder, unspecified: Secondary | ICD-10-CM | POA: Diagnosis not present

## 2023-04-06 DIAGNOSIS — Z9981 Dependence on supplemental oxygen: Secondary | ICD-10-CM | POA: Diagnosis not present

## 2023-04-07 DIAGNOSIS — J9601 Acute respiratory failure with hypoxia: Secondary | ICD-10-CM | POA: Diagnosis not present

## 2023-04-07 DIAGNOSIS — E038 Other specified hypothyroidism: Secondary | ICD-10-CM | POA: Diagnosis not present

## 2023-04-07 DIAGNOSIS — Z794 Long term (current) use of insulin: Secondary | ICD-10-CM | POA: Diagnosis not present

## 2023-04-07 DIAGNOSIS — I5033 Acute on chronic diastolic (congestive) heart failure: Secondary | ICD-10-CM | POA: Diagnosis not present

## 2023-04-07 DIAGNOSIS — U071 COVID-19: Secondary | ICD-10-CM | POA: Diagnosis not present

## 2023-04-07 DIAGNOSIS — I110 Hypertensive heart disease with heart failure: Secondary | ICD-10-CM | POA: Diagnosis not present

## 2023-04-07 DIAGNOSIS — M1990 Unspecified osteoarthritis, unspecified site: Secondary | ICD-10-CM | POA: Diagnosis not present

## 2023-04-07 DIAGNOSIS — F419 Anxiety disorder, unspecified: Secondary | ICD-10-CM | POA: Diagnosis not present

## 2023-04-07 DIAGNOSIS — J1561 Pneumonia due to Acinetobacter baumannii: Secondary | ICD-10-CM | POA: Diagnosis not present

## 2023-04-07 DIAGNOSIS — Z9981 Dependence on supplemental oxygen: Secondary | ICD-10-CM | POA: Diagnosis not present

## 2023-04-07 DIAGNOSIS — E119 Type 2 diabetes mellitus without complications: Secondary | ICD-10-CM | POA: Diagnosis not present

## 2023-04-09 DIAGNOSIS — G4733 Obstructive sleep apnea (adult) (pediatric): Secondary | ICD-10-CM | POA: Diagnosis not present

## 2023-04-09 DIAGNOSIS — I5030 Unspecified diastolic (congestive) heart failure: Secondary | ICD-10-CM | POA: Diagnosis not present

## 2023-04-09 DIAGNOSIS — M109 Gout, unspecified: Secondary | ICD-10-CM | POA: Diagnosis not present

## 2023-04-09 DIAGNOSIS — J9611 Chronic respiratory failure with hypoxia: Secondary | ICD-10-CM | POA: Diagnosis not present

## 2023-04-09 DIAGNOSIS — M6281 Muscle weakness (generalized): Secondary | ICD-10-CM | POA: Diagnosis not present

## 2023-04-11 DIAGNOSIS — I110 Hypertensive heart disease with heart failure: Secondary | ICD-10-CM | POA: Diagnosis not present

## 2023-04-11 DIAGNOSIS — J1561 Pneumonia due to Acinetobacter baumannii: Secondary | ICD-10-CM | POA: Diagnosis not present

## 2023-04-11 DIAGNOSIS — J9601 Acute respiratory failure with hypoxia: Secondary | ICD-10-CM | POA: Diagnosis not present

## 2023-04-11 DIAGNOSIS — Z794 Long term (current) use of insulin: Secondary | ICD-10-CM | POA: Diagnosis not present

## 2023-04-11 DIAGNOSIS — E119 Type 2 diabetes mellitus without complications: Secondary | ICD-10-CM | POA: Diagnosis not present

## 2023-04-11 DIAGNOSIS — I5033 Acute on chronic diastolic (congestive) heart failure: Secondary | ICD-10-CM | POA: Diagnosis not present

## 2023-04-11 DIAGNOSIS — M1990 Unspecified osteoarthritis, unspecified site: Secondary | ICD-10-CM | POA: Diagnosis not present

## 2023-04-11 DIAGNOSIS — E038 Other specified hypothyroidism: Secondary | ICD-10-CM | POA: Diagnosis not present

## 2023-04-11 DIAGNOSIS — U071 COVID-19: Secondary | ICD-10-CM | POA: Diagnosis not present

## 2023-04-11 DIAGNOSIS — Z9981 Dependence on supplemental oxygen: Secondary | ICD-10-CM | POA: Diagnosis not present

## 2023-04-11 DIAGNOSIS — F419 Anxiety disorder, unspecified: Secondary | ICD-10-CM | POA: Diagnosis not present

## 2023-04-14 DIAGNOSIS — I110 Hypertensive heart disease with heart failure: Secondary | ICD-10-CM | POA: Diagnosis not present

## 2023-04-14 DIAGNOSIS — Z794 Long term (current) use of insulin: Secondary | ICD-10-CM | POA: Diagnosis not present

## 2023-04-14 DIAGNOSIS — J9601 Acute respiratory failure with hypoxia: Secondary | ICD-10-CM | POA: Diagnosis not present

## 2023-04-14 DIAGNOSIS — U071 COVID-19: Secondary | ICD-10-CM | POA: Diagnosis not present

## 2023-04-14 DIAGNOSIS — M1990 Unspecified osteoarthritis, unspecified site: Secondary | ICD-10-CM | POA: Diagnosis not present

## 2023-04-14 DIAGNOSIS — J1561 Pneumonia due to Acinetobacter baumannii: Secondary | ICD-10-CM | POA: Diagnosis not present

## 2023-04-14 DIAGNOSIS — E119 Type 2 diabetes mellitus without complications: Secondary | ICD-10-CM | POA: Diagnosis not present

## 2023-04-14 DIAGNOSIS — F419 Anxiety disorder, unspecified: Secondary | ICD-10-CM | POA: Diagnosis not present

## 2023-04-14 DIAGNOSIS — E038 Other specified hypothyroidism: Secondary | ICD-10-CM | POA: Diagnosis not present

## 2023-04-14 DIAGNOSIS — Z9981 Dependence on supplemental oxygen: Secondary | ICD-10-CM | POA: Diagnosis not present

## 2023-04-14 DIAGNOSIS — I5033 Acute on chronic diastolic (congestive) heart failure: Secondary | ICD-10-CM | POA: Diagnosis not present

## 2023-04-14 DIAGNOSIS — J9611 Chronic respiratory failure with hypoxia: Secondary | ICD-10-CM | POA: Diagnosis not present

## 2023-04-15 ENCOUNTER — Ambulatory Visit (INDEPENDENT_AMBULATORY_CARE_PROVIDER_SITE_OTHER): Payer: Medicare HMO | Admitting: Family Medicine

## 2023-04-15 ENCOUNTER — Encounter: Payer: Self-pay | Admitting: Family Medicine

## 2023-04-15 VITALS — BP 118/82 | HR 57 | Ht 60.0 in | Wt 197.0 lb

## 2023-04-15 DIAGNOSIS — I5032 Chronic diastolic (congestive) heart failure: Secondary | ICD-10-CM | POA: Diagnosis not present

## 2023-04-15 DIAGNOSIS — Z0000 Encounter for general adult medical examination without abnormal findings: Secondary | ICD-10-CM | POA: Diagnosis not present

## 2023-04-15 DIAGNOSIS — R32 Unspecified urinary incontinence: Secondary | ICD-10-CM | POA: Diagnosis not present

## 2023-04-15 DIAGNOSIS — E1143 Type 2 diabetes mellitus with diabetic autonomic (poly)neuropathy: Secondary | ICD-10-CM

## 2023-04-15 DIAGNOSIS — N1831 Chronic kidney disease, stage 3a: Secondary | ICD-10-CM

## 2023-04-15 DIAGNOSIS — Z23 Encounter for immunization: Secondary | ICD-10-CM

## 2023-04-15 DIAGNOSIS — Z794 Long term (current) use of insulin: Secondary | ICD-10-CM

## 2023-04-15 DIAGNOSIS — E1159 Type 2 diabetes mellitus with other circulatory complications: Secondary | ICD-10-CM

## 2023-04-15 DIAGNOSIS — I152 Hypertension secondary to endocrine disorders: Secondary | ICD-10-CM | POA: Diagnosis not present

## 2023-04-15 MED ORDER — FUROSEMIDE 20 MG PO TABS: 20.0000 mg | ORAL_TABLET | Freq: Every day | ORAL | 1 refills | Status: DC | PRN

## 2023-04-15 MED ORDER — ZOSTER VAC RECOMB ADJUVANTED 50 MCG/0.5ML IM SUSR
0.5000 mL | Freq: Once | INTRAMUSCULAR | 0 refills | Status: AC
Start: 2023-04-15 — End: 2023-04-15

## 2023-04-15 NOTE — Progress Notes (Unsigned)
SUBJECTIVE:   CHIEF COMPLAINT / HPI:   Follow-up of urinary incontinence Rebecca Walter was last seen by myself via video visit 04/01/23.  Among the issues discussed, she had reported troublesome urinary incontinence.  Chart review showed that she was on both Lasix and spironolactone, has a history of HFpEF and BLE edema.  At that time, she was told to stop her Lasix and follow-up in 3 to 4 days.  Precautions were given to use Lasix for 3 pound weight gain, worsening leg swelling, or shortness of breath.  She follows up with me today, 4/24.  The originally scheduled 4/16 appointment was rescheduled.  Thankfully, today she reports her urinary incontinence has greatly improved after stopping the Lasix.  Denies shortness of breath, orthopnea, few pound weight gain, or worsening leg swelling.  Discussed that she can be off Lasix indefinitely given that it is not goal-directed medical treatment of her HFpEF, and is really only for symptom management.  Health maintenance Patient is due for the following: - Medicare annual wellness exam - Ophthalmology exam - Shingles vaccine  PERTINENT  PMH / PSH:  Patient Active Problem List   Diagnosis Date Noted   Mood disorder (HCC) 09/03/2022    Priority: High   BMI 39.0-39.9,adult 09/02/2022    Priority: High   Morbid obesity (HCC) 06/24/2022    Priority: High   Cerebral atrophy, mild (HCC) 06/24/2022    Priority: High   CKD (chronic kidney disease) stage 3, GFR 30-59 ml/min (HCC) 01/13/2022    Priority: High   Diabetic autonomic neuropathy (HCC) 01/24/2019    Priority: High   Hypertension associated with diabetes (HCC) 10/17/2018    Priority: High   Hyperlipidemia associated with type 2 diabetes mellitus (HCC) 02/16/2007    Priority: High   Mild cognitive impairment 09/03/2022    Priority: Medium    Hypothyroidism 05/05/2022    Priority: Medium    Formed visual hallucinations 04/15/2022    Priority: Medium    Obstructive sleep apnea  12/04/2019    Priority: Medium    Mild aortic stenosis 06/19/2017    Priority: Medium    Moderate pulmonary valve insufficiency 06/19/2017    Priority: Medium    Allergic rhinitis 03/22/2011    Priority: Medium    Gastroparesis 08/12/2009    Priority: Medium    GASTROESOPHAGEAL REFLUX, NO ESOPHAGITIS 02/16/2007    Priority: Medium    Osteopenia after menopause 02/16/2007    Priority: Medium    Gout 02/16/2007    Priority: Medium    Cervical spinal stenosis 05/11/2022    Priority: Low   Esophageal dysphagia 03/27/2021    Priority: Low   Bilateral leg numbness 12/31/2020    Priority: Low   Laryngopharyngeal reflux (LPR) 06/19/2020    Priority: Low   Frequent falls 04/17/2020    Priority: Low   Chronically dry eyes, right 12/15/2018    Priority: Low   Lumbar back pain with radiculopathy affecting right lower extremity 10/29/2016    Priority: Low   INSOMNIA 09/16/2009    Priority: Low   Anxiety 10/18/2008    Priority: Low   Osteoarthritis of both knees 02/16/2007    Priority: Low   Acute hypoxemic respiratory failure (HCC) 02/10/2023    Priority: 1.   HFpEF 70-75% 07/15/2017    Priority: 4.   Diabetes mellitus, type II (HCC) 02/16/2007    Priority: 5.   Leg swelling 04/01/2023   Urinary incontinence 04/01/2023   Healthcare maintenance 01/09/2023   Skin rash  12/07/2022   Abnormal MRI, spinal cord 01/03/2021   Restless leg syndrome 01/23/2020     OBJECTIVE:   BP 118/82   Pulse (!) 57   Ht 5' (1.524 m)   Wt 197 lb (89.4 kg)   LMP  (LMP Unknown)   SpO2 98%   BMI 38.47 kg/m    General: Awake, alert, NAD, pleasant and appropriate HEENT: Sclera anicteric and noninjected Cardiac: Regular rate and rhythm, no murmurs Respiratory: CTAB  ASSESSMENT/PLAN:   HFpEF 70-75% Continue spironolactone as prescribed. Stay off of Lasix.  Patient provided new prescription with instructions for as needed use only for 3 pound weight gain, worsening leg swelling, or SOB. BMP  today for K and creatinine check.  Hypertension associated with diabetes (HCC) At goal.  No changes today.  No adverse side effects.  CKD (chronic kidney disease) stage 3, GFR 30-59 ml/min (HCC) BMP today.  Urinary incontinence Greatly improved since we stopped Lasix.  Will continue with Lasix only for as needed use.  Diabetes mellitus, type II (HCC) Will collect urine microalbumin today. Patient instructed to return to ophthalmology for routine diabetic eye exam.  Healthcare maintenance Provided printed prescription for shingles vaccine. Front desk will call to schedule Medicare annual wellness exam.     Rebecca Pho, MD [facility]

## 2023-04-15 NOTE — Patient Instructions (Addendum)
It was wonderful to see you today. Thank you for allowing me to be a part of your care. Below is a short summary of what we discussed at your visit today:  Urinary incontinence I am glad your urine incontinence got better once we stopped the lasix!  Today we got some blood work to check your creatinine and potassium. If the results are normal, I will send you a letter or MyChart message. If the results are abnormal, I will give you a call.    Only use your lasix on days if you have 3 pound weight gain or more, worsening leg swelling, or shortness of breath.   Diabetes Next A1c check in late May.   Today we checked your urine to look for kidney damage from diabetes.  If the results are normal, I will send you a letter or MyChart message. If the results are abnormal, I will give you a call.    Return to [facility] for your annual eye exam. Please let us know if you need a new referral.   Medicare Annual Wellness Exam Your chart indicates that you are due for your Medicare annual wellness exam.  Your insurance likes [place] to do one of these every year.  This is a nurse only visit that takes about 30 minutes to an hour.  It can be done either in person or virtually.  This is an in-depth visit that focuses on preventative care and keeping you healthy.  Somebody from our clinic will be calling you soon to get this scheduled.  Shingles Vaccine I have written a prescription for the shingles vaccine.  Please take this to your pharmacy to have this administered. Your insurance prefers you get this specific vaccine at the pharmacy instead of in clinic.    Please bring all of your medications to every appointment!  If you have any questions or concerns, please do not hesitate to contact us via phone or MyChart message.   Fayette Pho, MD

## 2023-04-16 LAB — BASIC METABOLIC PANEL
BUN/Creatinine Ratio: 31 — ABNORMAL HIGH (ref 12–28)
BUN: 35 mg/dL — ABNORMAL HIGH (ref 8–27)
CO2: 21 mmol/L (ref 20–29)
Calcium: 10.1 mg/dL (ref 8.7–10.3)
Chloride: 104 mmol/L (ref 96–106)
Creatinine, Ser: 1.14 mg/dL — ABNORMAL HIGH (ref 0.57–1.00)
Glucose: 81 mg/dL (ref 70–99)
Potassium: 5.5 mmol/L — ABNORMAL HIGH (ref 3.5–5.2)
Sodium: 140 mmol/L (ref 134–144)
eGFR: 48 mL/min/{1.73_m2} — ABNORMAL LOW (ref >59)

## 2023-04-16 LAB — MICROALBUMIN / CREATININE URINE RATIO
Creatinine, Urine: 66.2 mg/dL
Microalb/Creat Ratio: 2568 mg/g creat — ABNORMAL HIGH (ref 0–29)
Microalbumin, Urine: 1700.3 ug/mL

## 2023-04-18 ENCOUNTER — Encounter: Payer: Self-pay | Admitting: Family Medicine

## 2023-04-18 DIAGNOSIS — J1561 Pneumonia due to Acinetobacter baumannii: Secondary | ICD-10-CM | POA: Diagnosis not present

## 2023-04-18 DIAGNOSIS — M1990 Unspecified osteoarthritis, unspecified site: Secondary | ICD-10-CM | POA: Diagnosis not present

## 2023-04-18 DIAGNOSIS — Z794 Long term (current) use of insulin: Secondary | ICD-10-CM | POA: Diagnosis not present

## 2023-04-18 DIAGNOSIS — I5033 Acute on chronic diastolic (congestive) heart failure: Secondary | ICD-10-CM | POA: Diagnosis not present

## 2023-04-18 DIAGNOSIS — I110 Hypertensive heart disease with heart failure: Secondary | ICD-10-CM | POA: Diagnosis not present

## 2023-04-18 DIAGNOSIS — E119 Type 2 diabetes mellitus without complications: Secondary | ICD-10-CM | POA: Diagnosis not present

## 2023-04-18 DIAGNOSIS — J9601 Acute respiratory failure with hypoxia: Secondary | ICD-10-CM | POA: Diagnosis not present

## 2023-04-18 DIAGNOSIS — E038 Other specified hypothyroidism: Secondary | ICD-10-CM | POA: Diagnosis not present

## 2023-04-18 DIAGNOSIS — Z9981 Dependence on supplemental oxygen: Secondary | ICD-10-CM | POA: Diagnosis not present

## 2023-04-18 DIAGNOSIS — U071 COVID-19: Secondary | ICD-10-CM | POA: Diagnosis not present

## 2023-04-18 DIAGNOSIS — F419 Anxiety disorder, unspecified: Secondary | ICD-10-CM | POA: Diagnosis not present

## 2023-04-18 NOTE — Assessment & Plan Note (Addendum)
Will collect urine microalbumin today. Patient instructed to return to ophthalmology for routine diabetic eye exam.

## 2023-04-18 NOTE — Assessment & Plan Note (Signed)
BMP today

## 2023-04-18 NOTE — Assessment & Plan Note (Signed)
At goal.  No changes today.  No adverse side effects.

## 2023-04-18 NOTE — Assessment & Plan Note (Addendum)
Greatly improved since we stopped Lasix.  Will continue with Lasix only for as needed use.

## 2023-04-18 NOTE — Assessment & Plan Note (Addendum)
Continue spironolactone as prescribed. Stay off of Lasix.  Patient provided new prescription with instructions for as needed use only for 3 pound weight gain, worsening leg swelling, or SOB. BMP today for K and creatinine check.

## 2023-04-18 NOTE — Assessment & Plan Note (Signed)
Provided printed prescription for shingles vaccine. Front desk will call to schedule Medicare annual wellness exam.

## 2023-04-19 ENCOUNTER — Telehealth: Payer: Self-pay | Admitting: Family Medicine

## 2023-04-19 NOTE — Telephone Encounter (Signed)
Called patient to discuss lab results. Creatinine bump and potassium to 5.5.   First call to patient, no answer, left VM.  Second call to patient - answered.  Potassium likely bumped because we stopped her Lasix several weeks ago (due to urinary incontinence and not any Lasix currently for BLE edema from HFpEF) but she continued on her spironolactone and valsartan.  Discussed stopping the spironolactone and valsartan, patient reports that she is already taking his medications today.  States that she will not take any more doses until we have told her it is safe.  Patient reports feeling poorly starting Sunday afternoon. Says her sugars have been quite variable and it is making her feel bad. Denies SOB, chest pain, palpitation, light-headedness, and pre-syncope.   Patient booked for appointment tomorrow afternoon with Dr. Lum Babe at 3:50 PM (first available, as patient could not be here in the morning).  Expect stat BMP and EKG at that appointment.  ED precautions given - told to go to hospital if short of breath, chest pain, heart beating out of chest, pre-syncopal, or heart skipping beats. Patient verbalized understanding.   Fayette Pho, MD

## 2023-04-20 ENCOUNTER — Ambulatory Visit (INDEPENDENT_AMBULATORY_CARE_PROVIDER_SITE_OTHER): Payer: Medicare HMO | Admitting: Family Medicine

## 2023-04-20 ENCOUNTER — Other Ambulatory Visit: Payer: Self-pay | Admitting: Family Medicine

## 2023-04-20 ENCOUNTER — Ambulatory Visit (HOSPITAL_COMMUNITY)
Admission: RE | Admit: 2023-04-20 | Discharge: 2023-04-20 | Disposition: A | Discharge status: Home / Self Care | Payer: Medicare HMO | Source: Ambulatory Visit | Attending: Family Medicine | Admitting: Family Medicine

## 2023-04-20 ENCOUNTER — Encounter: Payer: Self-pay | Admitting: Family Medicine

## 2023-04-20 VITALS — BP 166/64 | HR 76 | Temp 98.8°F | Ht 60.0 in | Wt 197.0 lb

## 2023-04-20 DIAGNOSIS — D649 Anemia, unspecified: Secondary | ICD-10-CM | POA: Diagnosis not present

## 2023-04-20 DIAGNOSIS — E875 Hyperkalemia: Secondary | ICD-10-CM | POA: Diagnosis not present

## 2023-04-20 DIAGNOSIS — R5383 Other fatigue: Secondary | ICD-10-CM

## 2023-04-20 DIAGNOSIS — N289 Disorder of kidney and ureter, unspecified: Secondary | ICD-10-CM

## 2023-04-20 DIAGNOSIS — E1159 Type 2 diabetes mellitus with other circulatory complications: Secondary | ICD-10-CM

## 2023-04-20 DIAGNOSIS — R059 Cough, unspecified: Secondary | ICD-10-CM

## 2023-04-20 DIAGNOSIS — I152 Hypertension secondary to endocrine disorders: Secondary | ICD-10-CM | POA: Diagnosis not present

## 2023-04-20 DIAGNOSIS — E559 Vitamin D deficiency, unspecified: Secondary | ICD-10-CM

## 2023-04-20 NOTE — Progress Notes (Unsigned)
SUBJECTIVE:   CHIEF COMPLAINT / HPI:   Sick: C/O has not been feeling well for about a week. She denies cough or SOB. However, she feels mucus in her throat and keeps clearing it. There is no measured fever, but she felt warm a few days ago at home. She endorses a low appetite but has been drinking adequate water and urinating a lot. She denies neurologic symptoms, no chest pain, and sick contact. She recently got back from home from rehab after hospital discharge for pneumonia.  Kidney Impairment/Hyperkalemia/HTN: PCP contacted her for abnormal lab results, which showed elevated potassium and creatinine levels. She was recently taken off her Lasix due to excessive urination. However, she continues her Aldactone and Valsatan. Her home BP ranges in the 150s-160s systolic. She endorsed compliance with her Norvasc 10 mg daily as well.  PERTINENT  PMH / PSH: PMHx reviewed  OBJECTIVE:   BP (!) 166/64   Pulse 76   Temp 98.8 F (37.1 C) (Oral)   Ht 5' (1.524 m)   Wt 197 lb (89.4 kg)   LMP  (LMP Unknown)   SpO2 96%   BMI 38.47 kg/m   Physical Exam Vitals and nursing note reviewed.  Constitutional:      General: She is not in acute distress.    Appearance: Normal appearance. She is not ill-appearing, toxic-appearing or diaphoretic.  Cardiovascular:     Rate and Rhythm: Normal rate and regular rhythm.     Heart sounds: Normal heart sounds. No murmur heard. Pulmonary:     Effort: Pulmonary effort is normal. No respiratory distress.     Breath sounds: Normal breath sounds. No wheezing or rhonchi.  Neurological:     Mental Status: She is oriented to person, place, and time.      ASSESSMENT/PLAN:  Fatigue: This may be multifactorial: viral illness, electrolyte imbalance, anemia, poor food intake, or deconditioning. The COVID-19 test done at this visit was negative. No signs or symptoms of pneumonia/respiratory illness. She saturates well on RA at 98%. Improving food intake,  hydration, and rest were discussed. Cmet and anemia panel ordered for future lab [facility] lab was closed at the time of this visit) ED precuation discussed. She and her son verbalized understanding.  Acute renal impairment Here for lab today. Unfortunately [facility] lab closed at 3 p.m right after patient arrived. [facility] today. However, she and her son changed their mind at the last minutes about this. Lab appointment scheduled for 04/21/23 in our clinic. They have PCP f/u appointment on Friday as well for close monitoring. Continue good hydration. I will contact them soon with her test results.  Hyperkalemia Mild hyperkalemia in the setting of Aldactone/Valsartan. Repeat lab as scheduled - unable to obtain lab during this visit. EKG done during this visit showed 1st degree block, otherwise, no peaked T waves. Consider holding one of the above medications if level worsens. ED precautions discussed. She and her son verbalized understanding.  Hypertension associated with diabetes (HCC) Her blood pressure is above goal. She is currently in Valsartan, Aldactone and Norvasc. May need to be off either Valsartan or Aldactone depending on her Cmet test results. She will return to PCP to reassessment this Friday with plan to adjust her BP regimen. Monitor BP closely at home.   Chronic anemia May be contributory to her sick feeling. Anemia profile ordered to assess.     Janit Pagan, MD [facility]

## 2023-04-20 NOTE — Patient Instructions (Signed)
Hyperkalemia Hyperkalemia is when you have too much of a mineral called potassium in your blood. If there is too much potassium in your blood, it can affect how your heart works. Potassium is normally removed from your body by your kidneys. What are the causes? Taking in too much potassium. This can happen if: You use salt substitutes. You take potassium supplements. You eat too many foods that are high in potassium if you have kidney disease. Your body not being able to get rid of potassium. This can happen if: Your kidneys are not working properly. You are taking certain medicines. You have a condition called Addison's disease. You have kidney stones. You are on treatment to clean your blood (dialysis) and you skip a treatment. Your cells releasing a high amount of potassium into the blood. This can happen if: You have a muscle injury. You have very bad burns or infections. You have problems with your blood plasma. This can be caused by diabetes that is not well controlled. What increases the risk? Drinking too much alcohol. Using drugs a lot. What are the signs or symptoms? In many cases, there are no symptoms. But, when your potassium level becomes high enough, you may have symptoms such as: An irregular heartbeat. A very slow heartbeat. Feeling like you may vomit (nauseous). Tiredness. Confusion. Tingling of your skin. Numbness of your hands or feet. Muscle cramps. Muscle weakness. Not being able to move (paralysis). How is this treated? Treatment depends on how bad your condition is. Treatment may need to be done in the hospital. It may include: Receiving a fluid with sugar (glucose) in it through an IV tube, along with insulin. Taking medicines. Having treatment to clean your blood. Taking calcium. Follow these instructions at home:  Take over-the-counter and prescription medicines only as told by your doctor. Do not take any of the following unless your doctor says it  is okay: Supplements. Natural food products. Herbs. Vitamins. If you drink alcohol, limit how much you have as told by your doctor. Do not use illegal drugs. If you need help quitting, ask your doctor. If you have kidney disease, you may need to follow a low-potassium diet. A food expert (dietitian) can help you. Keep all follow-up visits. Contact a doctor if: Your heartbeat is not regular or is very slow. You feel dizzy. You feel weak. You feel like you may vomit. You have tingling in your hands or feet. You lose feeling in your hands or feet. Get help right away if: You are short of breath. You have chest pain. You faint. You cannot move your muscles. These symptoms may be an emergency. Get help right away. Call 911. Do not wait to see if the symptoms will go away. Do not drive yourself to the hospital. Summary Hyperkalemia is when you have too much potassium in your blood. Take over-the-counter and prescription medicines only as told by your doctor. Limit how much alcohol you have as told by your doctor. Contact a doctor if your heartbeat is not regular. This information is not intended to replace advice given to you by your health care provider. Make sure you discuss any questions you have with your health care provider. Document Revised: 08/20/2021 Document Reviewed: 08/20/2021 Elsevier Patient Education  2023 [employer] Inc[employer]    

## 2023-04-21 ENCOUNTER — Other Ambulatory Visit: Payer: Medicare HMO

## 2023-04-21 DIAGNOSIS — E559 Vitamin D deficiency, unspecified: Secondary | ICD-10-CM

## 2023-04-21 DIAGNOSIS — E875 Hyperkalemia: Secondary | ICD-10-CM | POA: Diagnosis not present

## 2023-04-21 DIAGNOSIS — R5383 Other fatigue: Secondary | ICD-10-CM | POA: Diagnosis not present

## 2023-04-21 DIAGNOSIS — N289 Disorder of kidney and ureter, unspecified: Secondary | ICD-10-CM

## 2023-04-21 DIAGNOSIS — D649 Anemia, unspecified: Secondary | ICD-10-CM | POA: Insufficient documentation

## 2023-04-21 LAB — POC SOFIA SARS ANTIGEN FIA: SARS Coronavirus 2 Ag: NEGATIVE

## 2023-04-21 NOTE — Assessment & Plan Note (Signed)
May be contributory to her sick feeling. Anemia profile ordered to assess.

## 2023-04-21 NOTE — Assessment & Plan Note (Signed)
Here for lab today. Unfortunately [facility] lab closed at 3 p.m right after patient arrived. [facility] for [facility] today. However, she and her son changed their mind at the last minutes about this. Lab appointment scheduled for 04/21/23 in our clinic. They have PCP f/u appointment on Friday as well for close monitoring. Continue good hydration. I will contact them soon with her test results.

## 2023-04-21 NOTE — Addendum Note (Signed)
Addended by: Jennette Bill on: 04/21/2023 08:15 AM   Modules accepted: Orders

## 2023-04-21 NOTE — Assessment & Plan Note (Signed)
Mild hyperkalemia in the setting of Aldactone/Valsartan. Repeat lab as scheduled - unable to obtain lab during this visit. EKG done during this visit showed 1st degree block, otherwise, no peaked T waves. Consider holding one of the above medications if level worsens. ED precautions discussed. She and her son verbalized understanding.

## 2023-04-21 NOTE — Assessment & Plan Note (Signed)
Her blood pressure is above goal. She is currently in Valsartan, Aldactone and Norvasc. May need to be off either Valsartan or Aldactone depending on her Cmet test results. She will return to PCP to reassessment this Friday with plan to adjust her BP regimen. Monitor BP closely at home.

## 2023-04-21 NOTE — Addendum Note (Signed)
Addended by: Janit Pagan T on: 04/21/2023 05:15 AM   Modules accepted: Orders

## 2023-04-22 ENCOUNTER — Encounter: Payer: Self-pay | Admitting: Family Medicine

## 2023-04-22 ENCOUNTER — Other Ambulatory Visit: Payer: Self-pay | Admitting: Family Medicine

## 2023-04-22 ENCOUNTER — Telehealth: Payer: Self-pay | Admitting: Family Medicine

## 2023-04-22 ENCOUNTER — Ambulatory Visit (INDEPENDENT_AMBULATORY_CARE_PROVIDER_SITE_OTHER): Payer: Medicare HMO | Admitting: Family Medicine

## 2023-04-22 VITALS — BP 146/62 | HR 78 | Ht 60.0 in | Wt 198.0 lb

## 2023-04-22 DIAGNOSIS — N3941 Urge incontinence: Secondary | ICD-10-CM | POA: Diagnosis not present

## 2023-04-22 DIAGNOSIS — Z23 Encounter for immunization: Secondary | ICD-10-CM

## 2023-04-22 DIAGNOSIS — E1159 Type 2 diabetes mellitus with other circulatory complications: Secondary | ICD-10-CM

## 2023-04-22 DIAGNOSIS — I152 Hypertension secondary to endocrine disorders: Secondary | ICD-10-CM | POA: Diagnosis not present

## 2023-04-22 LAB — ANEMIA PROFILE B
Basophils Absolute: 0.1 10*3/uL (ref 0.0–0.2)
Basos: 1 %
EOS (ABSOLUTE): 0.4 10*3/uL (ref 0.0–0.4)
Eos: 4 %
Ferritin: 77 ng/mL (ref 15–150)
Folate: 19 ng/mL (ref >3.0)
Hematocrit: 36.1 % (ref 34.0–46.6)
Hemoglobin: 11.3 g/dL (ref 11.1–15.9)
Immature Grans (Abs): 0.1 10*3/uL (ref 0.0–0.1)
Immature Granulocytes: 1 %
Iron Saturation: 18 % (ref 15–55)
Iron: 62 ug/dL (ref 27–139)
Lymphocytes Absolute: 1.6 10*3/uL (ref 0.7–3.1)
Lymphs: 16 %
MCH: 28.4 pg (ref 26.6–33.0)
MCHC: 31.3 g/dL — ABNORMAL LOW (ref 31.5–35.7)
MCV: 91 fL (ref 79–97)
Monocytes Absolute: 1 10*3/uL — ABNORMAL HIGH (ref 0.1–0.9)
Monocytes: 10 %
Neutrophils Absolute: 7.2 10*3/uL — ABNORMAL HIGH (ref 1.4–7.0)
Neutrophils: 68 %
Platelets: 207 10*3/uL (ref 150–450)
RBC: 3.98 x10E6/uL (ref 3.77–5.28)
RDW: 15 % (ref 11.7–15.4)
Retic Ct Pct: 1.8 % (ref 0.6–2.6)
Total Iron Binding Capacity: 346 ug/dL (ref 250–450)
UIBC: 284 ug/dL (ref 118–369)
Vitamin B-12: 532 pg/mL (ref 232–1245)
WBC: 10.3 10*3/uL (ref 3.4–10.8)

## 2023-04-22 LAB — CMP14+EGFR
ALT: 14 IU/L (ref 0–32)
AST: 22 IU/L (ref 0–40)
Albumin/Globulin Ratio: 1.5 (ref 1.2–2.2)
Albumin: 4.1 g/dL (ref 3.7–4.7)
Alkaline Phosphatase: 76 IU/L (ref 44–121)
BUN/Creatinine Ratio: 28 (ref 12–28)
BUN: 27 mg/dL (ref 8–27)
Bilirubin Total: 0.2 mg/dL (ref 0.0–1.2)
CO2: 18 mmol/L — ABNORMAL LOW (ref 20–29)
Calcium: 9.6 mg/dL (ref 8.7–10.3)
Chloride: 102 mmol/L (ref 96–106)
Creatinine, Ser: 0.96 mg/dL (ref 0.57–1.00)
Globulin, Total: 2.8 g/dL (ref 1.5–4.5)
Glucose: 187 mg/dL — ABNORMAL HIGH (ref 70–99)
Potassium: 4.9 mmol/L (ref 3.5–5.2)
Sodium: 140 mmol/L (ref 134–144)
Total Protein: 6.9 g/dL (ref 6.0–8.5)
eGFR: 59 mL/min/{1.73_m2} — ABNORMAL LOW (ref >59)

## 2023-04-22 LAB — VITAMIN D 25 HYDROXY (VIT D DEFICIENCY, FRACTURES): Vit D, 25-Hydroxy: 34 ng/mL (ref 30.0–100.0)

## 2023-04-22 LAB — TSH: TSH: 5.18 u[IU]/mL — ABNORMAL HIGH (ref 0.450–4.500)

## 2023-04-22 MED ORDER — SHINGRIX 50 MCG/0.5ML IM SUSR
0.5000 mL | Freq: Once | INTRAMUSCULAR | 0 refills | Status: AC
Start: 2023-04-22 — End: 2023-04-22

## 2023-04-22 NOTE — Patient Instructions (Addendum)
It was wonderful to see you today. Thank you for allowing me to be a part of your care. Below is a short summary of what we discussed at your visit today:  Urine incontinence  Continue to hold spironolactone. Don't take this any more until we tell you to restart.  Timed voiding - go to the bathroom every 2-3 hours on a timer even though you don't feel like you have to go.  Kegel exercises - to strengthen the muscles of the pelvic floor. Look at the handout and let me know if you want to go to the specialist (pelvic floor physical therapist).   Blood pressure Restart your valsartan.  Come back in 1-2 weeks for a recheck.   Potassium and creatinine levels The labs from Thursday showed improvement. I am very glad.   Shingles Vaccine I have written a prescription for the shingles vaccine.  Please take this to your pharmacy to have this administered. Your insurance prefers you get this specific vaccine at the pharmacy instead of in clinic.   Medicare Annual Wellness Exam Your chart indicates that you are due for your Medicare annual wellness exam.  Your insurance likes [place] to do one of these every year.  This is a nurse only visit that takes about 30 minutes to an hour.  It can be done either in person or virtually.  This is an in-depth visit that focuses on preventative care and keeping you healthy.  Somebody from our clinic will be calling you soon to get this scheduled.   Please bring all of your medications to every appointment!  If you have any questions or concerns, please do not hesitate to contact us via phone or MyChart message.   Fayette Pho, MD

## 2023-04-22 NOTE — Progress Notes (Addendum)
   SUBJECTIVE:   CHIEF COMPLAINT / HPI:   Follow-up of electrolytes Was seen by Dr. Lum Babe 5/1 for evaluation of lab abnormalities seen a week prior, with creatinine elevated and potassium up to 5.5 in the wake of Lasix discontinuation concurrent long-term use of valsartan and spironolactone.  Lab work demonstrated improved creatinine to 0.96 and potassium now 4.9.  Anemia profile demonstrated improved hemoglobin now within the normal range, TSH mildly elevated at 5.1, and vitamin D now within normal range.  Urge urinary incontinence Patient reports severe urine incontinence that has not been improved after stopping Lasix.  She reports bothersome urge urinary incontinence that limits her mobility out of the house and is becoming quite costly given products needed such as incontinence pads and disposable bed pads.   She reports urinary incontinence only with severe urge, she is unable to make it to the bathroom.  Sometimes she has some incontinence with raising from chair as well.  She reports she always knows that she has a full bladder, simply cannot get to the bathroom fast enough.  Seems to get the "signal" a little too late.  Denies any issues with incontinence without feeling of bladder fullness.  HM -Due for Medicare annual wellness exam - Eligible for shingles vaccine   PERTINENT  PMH / PSH: T2DM, HTN, HFpEF 70-75%, moderate PV insufficiency, HLD, hypothyroidism, CKD 3   OBJECTIVE:   BP (!) 146/62   Pulse 78   Ht 5' (1.524 m)   Wt 198 lb (89.8 kg)   LMP  (LMP Unknown)   SpO2 94%   BMI 38.67 kg/m    Gen: awake, alert, NAD Cardiac: RRR, no murmur Resp: CTAB  ASSESSMENT/PLAN:   Hypertension associated with diabetes (HCC) Systolic pressure elevated to 409 on exam today, systolic within normal range.  Patient very concerned about her blood pressure.  Given concerns with urge urinary incontinence, will simply have patient restart her valsartan and continue to hold  spironolactone.  We can restart spironolactone once the urge urinary incontinence is better controlled.  Need for shingles vaccine Provided printed Shingrix prescription for patient to obtain at her convenience.  Urinary incontinence Patient reports continued bothersome urge urinary incontinence.  We will continue to hold spironolactone given this incontinence.  Lasix is only for as needed use with strict parameters.  Given the various side effects of any urinary continence medications, I am quite hesitant to start these unless she fails conservative measures. - Timed voiding - Reduction of caffeine - Kegel exercises, handout provided - Patient elects for home exercise at this time, but we could consider pelvic floor PT in the future    Fayette Pho, MD [facility]   I was available as preceptor for Dr Jerolyn Center visit with this patient.  Acquanetta Belling, MD

## 2023-04-22 NOTE — Telephone Encounter (Signed)
  Test results discussed. Hyperglycemia in DM2 - use meds as prescribed by PCP K+ normal value - monitor closely with PCP AKI - resolved - monitor closely with PCP Hemoglobin normal Vitamin D normal TSH is slightly elevated. She endorsed good compliance with her Synthroid. Since this is a mild elevation, I recommended repeat test with PCP in 2-4 weeks and adjust meds as needed. She agreed with the plan. Overall, she feels a bit better. PCP appointment scheduled for today for further lab review and discussion.

## 2023-04-23 DIAGNOSIS — Z23 Encounter for immunization: Secondary | ICD-10-CM | POA: Insufficient documentation

## 2023-04-23 NOTE — Assessment & Plan Note (Signed)
Patient reports continued bothersome urge urinary incontinence.  We will continue to hold spironolactone given this incontinence.  Lasix is only for as needed use with strict parameters.  Given the various side effects of any urinary continence medications, I am quite hesitant to start these unless she fails conservative measures. - Timed voiding - Reduction of caffeine - Kegel exercises, handout provided - Patient elects for home exercise at this time, but we could consider pelvic floor PT in the future

## 2023-04-23 NOTE — Assessment & Plan Note (Signed)
Provided printed Shingrix prescription for patient to obtain at her convenience.

## 2023-04-23 NOTE — Assessment & Plan Note (Signed)
Systolic pressure elevated to 604 on exam today, systolic within normal range.  Patient very concerned about her blood pressure.  Given concerns with urge urinary incontinence, will simply have patient restart her valsartan and continue to hold spironolactone.  We can restart spironolactone once the urge urinary incontinence is better controlled.

## 2023-04-28 DIAGNOSIS — J9601 Acute respiratory failure with hypoxia: Secondary | ICD-10-CM | POA: Diagnosis not present

## 2023-04-28 DIAGNOSIS — E119 Type 2 diabetes mellitus without complications: Secondary | ICD-10-CM | POA: Diagnosis not present

## 2023-04-28 DIAGNOSIS — J1561 Pneumonia due to Acinetobacter baumannii: Secondary | ICD-10-CM | POA: Diagnosis not present

## 2023-04-28 DIAGNOSIS — U071 COVID-19: Secondary | ICD-10-CM | POA: Diagnosis not present

## 2023-04-28 DIAGNOSIS — E038 Other specified hypothyroidism: Secondary | ICD-10-CM | POA: Diagnosis not present

## 2023-04-28 DIAGNOSIS — F419 Anxiety disorder, unspecified: Secondary | ICD-10-CM | POA: Diagnosis not present

## 2023-04-28 DIAGNOSIS — I5033 Acute on chronic diastolic (congestive) heart failure: Secondary | ICD-10-CM | POA: Diagnosis not present

## 2023-04-28 DIAGNOSIS — M1990 Unspecified osteoarthritis, unspecified site: Secondary | ICD-10-CM | POA: Diagnosis not present

## 2023-04-28 DIAGNOSIS — Z794 Long term (current) use of insulin: Secondary | ICD-10-CM | POA: Diagnosis not present

## 2023-04-28 DIAGNOSIS — I110 Hypertensive heart disease with heart failure: Secondary | ICD-10-CM | POA: Diagnosis not present

## 2023-04-28 DIAGNOSIS — Z9981 Dependence on supplemental oxygen: Secondary | ICD-10-CM | POA: Diagnosis not present

## 2023-04-29 DIAGNOSIS — M159 Polyosteoarthritis, unspecified: Secondary | ICD-10-CM | POA: Diagnosis not present

## 2023-04-29 DIAGNOSIS — E1121 Type 2 diabetes mellitus with diabetic nephropathy: Secondary | ICD-10-CM | POA: Diagnosis not present

## 2023-04-29 DIAGNOSIS — Z794 Long term (current) use of insulin: Secondary | ICD-10-CM | POA: Diagnosis not present

## 2023-04-29 DIAGNOSIS — I10 Essential (primary) hypertension: Secondary | ICD-10-CM | POA: Diagnosis not present

## 2023-04-29 DIAGNOSIS — Z0000 Encounter for general adult medical examination without abnormal findings: Secondary | ICD-10-CM | POA: Diagnosis not present

## 2023-04-29 DIAGNOSIS — R2681 Unsteadiness on feet: Secondary | ICD-10-CM | POA: Diagnosis not present

## 2023-04-29 DIAGNOSIS — I110 Hypertensive heart disease with heart failure: Secondary | ICD-10-CM | POA: Diagnosis not present

## 2023-04-29 DIAGNOSIS — Z6838 Body mass index (BMI) 38.0-38.9, adult: Secondary | ICD-10-CM | POA: Diagnosis not present

## 2023-05-02 ENCOUNTER — Ambulatory Visit: Payer: Self-pay | Admitting: Student

## 2023-05-02 DIAGNOSIS — E038 Other specified hypothyroidism: Secondary | ICD-10-CM | POA: Diagnosis not present

## 2023-05-02 DIAGNOSIS — I5033 Acute on chronic diastolic (congestive) heart failure: Secondary | ICD-10-CM | POA: Diagnosis not present

## 2023-05-02 DIAGNOSIS — U071 COVID-19: Secondary | ICD-10-CM | POA: Diagnosis not present

## 2023-05-02 DIAGNOSIS — J9601 Acute respiratory failure with hypoxia: Secondary | ICD-10-CM | POA: Diagnosis not present

## 2023-05-02 DIAGNOSIS — J1561 Pneumonia due to Acinetobacter baumannii: Secondary | ICD-10-CM | POA: Diagnosis not present

## 2023-05-02 DIAGNOSIS — E119 Type 2 diabetes mellitus without complications: Secondary | ICD-10-CM | POA: Diagnosis not present

## 2023-05-02 DIAGNOSIS — F419 Anxiety disorder, unspecified: Secondary | ICD-10-CM | POA: Diagnosis not present

## 2023-05-02 DIAGNOSIS — M1990 Unspecified osteoarthritis, unspecified site: Secondary | ICD-10-CM | POA: Diagnosis not present

## 2023-05-02 DIAGNOSIS — Z794 Long term (current) use of insulin: Secondary | ICD-10-CM | POA: Diagnosis not present

## 2023-05-02 DIAGNOSIS — Z9981 Dependence on supplemental oxygen: Secondary | ICD-10-CM | POA: Diagnosis not present

## 2023-05-02 DIAGNOSIS — I110 Hypertensive heart disease with heart failure: Secondary | ICD-10-CM | POA: Diagnosis not present

## 2023-05-02 NOTE — Progress Notes (Signed)
I have discussed the case with Dr. Lynn.   I agree with their documentation and management in their note for today.    

## 2023-05-03 ENCOUNTER — Ambulatory Visit (INDEPENDENT_AMBULATORY_CARE_PROVIDER_SITE_OTHER): Payer: Medicare HMO | Admitting: Podiatry

## 2023-05-03 ENCOUNTER — Other Ambulatory Visit: Payer: Self-pay

## 2023-05-03 ENCOUNTER — Other Ambulatory Visit: Payer: Self-pay | Admitting: Family Medicine

## 2023-05-03 ENCOUNTER — Encounter: Payer: Self-pay | Admitting: Podiatry

## 2023-05-03 DIAGNOSIS — E119 Type 2 diabetes mellitus without complications: Secondary | ICD-10-CM | POA: Diagnosis not present

## 2023-05-03 DIAGNOSIS — M79675 Pain in left toe(s): Secondary | ICD-10-CM | POA: Diagnosis not present

## 2023-05-03 DIAGNOSIS — B351 Tinea unguium: Secondary | ICD-10-CM | POA: Diagnosis not present

## 2023-05-03 DIAGNOSIS — M79674 Pain in right toe(s): Secondary | ICD-10-CM

## 2023-05-03 DIAGNOSIS — E1143 Type 2 diabetes mellitus with diabetic autonomic (poly)neuropathy: Secondary | ICD-10-CM | POA: Diagnosis not present

## 2023-05-03 DIAGNOSIS — M2042 Other hammer toe(s) (acquired), left foot: Secondary | ICD-10-CM | POA: Diagnosis not present

## 2023-05-03 DIAGNOSIS — Z794 Long term (current) use of insulin: Secondary | ICD-10-CM

## 2023-05-03 DIAGNOSIS — M2041 Other hammer toe(s) (acquired), right foot: Secondary | ICD-10-CM

## 2023-05-04 ENCOUNTER — Telehealth: Payer: Self-pay | Admitting: Family Medicine

## 2023-05-04 DIAGNOSIS — M5416 Radiculopathy, lumbar region: Secondary | ICD-10-CM

## 2023-05-04 DIAGNOSIS — F419 Anxiety disorder, unspecified: Secondary | ICD-10-CM

## 2023-05-05 ENCOUNTER — Other Ambulatory Visit: Payer: Self-pay | Admitting: Family Medicine

## 2023-05-05 ENCOUNTER — Telehealth: Payer: Self-pay | Admitting: Family Medicine

## 2023-05-05 MED ORDER — INSULIN PEN NEEDLE 32G X 6 MM MISC: 3 refills | Status: AC

## 2023-05-05 MED ORDER — HYDROXYZINE HCL 10 MG PO TABS: 10.0000 mg | ORAL_TABLET | Freq: Three times a day (TID) | ORAL | 0 refills | Status: DC | PRN

## 2023-05-05 NOTE — Telephone Encounter (Signed)
Patient calls nurse line requesting pen needles for lantus pen.   I do not see these on medication list to pend.

## 2023-05-05 NOTE — Telephone Encounter (Signed)
Contacted Rebecca Walter to schedule their annual wellness visit. Appointment made for 05/06/2023.  Thank you,  [facility] Support [facility] Direct dial  [phone]

## 2023-05-06 ENCOUNTER — Other Ambulatory Visit: Payer: Self-pay | Admitting: Family Medicine

## 2023-05-06 ENCOUNTER — Ambulatory Visit (INDEPENDENT_AMBULATORY_CARE_PROVIDER_SITE_OTHER): Payer: Medicare HMO

## 2023-05-06 VITALS — Ht 60.0 in | Wt 198.0 lb

## 2023-05-06 DIAGNOSIS — M5416 Radiculopathy, lumbar region: Secondary | ICD-10-CM

## 2023-05-06 DIAGNOSIS — Z0000 Encounter for general adult medical examination without abnormal findings: Secondary | ICD-10-CM

## 2023-05-06 NOTE — Progress Notes (Signed)
ANNUAL DIABETIC FOOT EXAM  Subjective: Rebecca Walter presents today annual diabetic foot exam.  Chief Complaint  Patient presents with   Nail Problem    DFC [id number] A1C-7.2 PCP-Rebecca Walter PCP VST-"Couple weeks ago"    Patient confirms h/o diabetes.  Patient denies any h/o foot wounds.  Patient has been diagnosed with neuropathy.  Risk factors: diabetes, diabetic neuropathy, history of gout, HTN, CHF, CKD, hyperlipidemia.  Rebecca Pho, Rebecca Walter is patient's PCP.  Past Medical History:  Diagnosis Date   Acute pain of both knees 04/04/2022   Anxiety    Arthritis    Basal cell carcinoma 03/02/2021   Candidiasis of skin 02/14/2020   CARCINOMA, BASAL CELL 05/26/2007   Qualifier: History of  By: Lula Olszewski Rebecca Walter, Rebecca Walter     Carpal tunnel syndrome of right wrist 11/26/2020   Cellulitis of left lower extremity 12/03/2022   Cervical spinal stenosis 05/11/2022   MRI Brain (05/11/22) shows moderate cervical spinal stenosis   Chronic diastolic CHF (congestive heart failure) (HCC)    Echocardiogram 01/2020: EF 70-75, no RWMA, Gr 1 DD, normal RVSF, RVSP 45.8, mild to mod LAE, trivial MR, mild PI   Coronavirus infection 02/16/2023   Diabetes mellitus    Diarrhea of presumed infectious origin 01/16/2021   DYSKINESIA, ESOPHAGUS 09/13/2007   Qualifier: Diagnosis of  By: Clelia Croft Rebecca Walter, Kimberlee     Early satiety 09/04/2020   Elevated TSH 11/21/2017   Epistaxis 02/04/2020   Generalized weakness 12/15/2018   GERD (gastroesophageal reflux disease)    Gout    History of melanoma 01/31/2021   History of nuclear stress test    Myoview 06/2020: EF 75, no ischemia or infarction, low risk   Hoarseness 11/27/2019   Hyperlipidemia    Hypertension    Hypertensive heart disease with CHF (congestive heart failure) (HCC)    Hyponatremia    Leg fatigue 01/03/2021   Mild aortic stenosis 06/2017   Mild cognitive impairment 09/03/2022   Mild mitral regurgitation 06/2017   Moderate pulmonary valve insufficiency  06/2017   OBESITY, NOS 02/16/2007   Qualifier: Diagnosis of  By: Haydee Salter     Osteopenia    Pneumonia of both lower lobes due to infectious organism 02/11/2023   Sepsis due to Acinetobacter baumannii with acute hypoxic respiratory failure without septic shock (HCC) 02/16/2023   SOB (shortness of breath) 02/11/2023   Vertigo 08/29/2013   Patient Active Problem List   Diagnosis Date Noted   Acute renal impairment 04/21/2023   Hyperkalemia 04/21/2023   Chronic anemia 04/21/2023   Leg swelling 04/01/2023   Urinary incontinence 04/01/2023   Acute hypoxemic respiratory failure (HCC) 02/10/2023   Healthcare maintenance 01/09/2023   Skin rash 12/07/2022   Mild cognitive impairment 09/03/2022   Mood disorder (HCC) 09/03/2022   BMI 39.0-39.9,adult 09/02/2022   Morbid obesity (HCC) 06/24/2022   Cerebral atrophy, mild (HCC) 06/24/2022   Cervical spinal stenosis 05/11/2022   Hypothyroidism 05/05/2022   Formed visual hallucinations 04/15/2022   CKD (chronic kidney disease) stage 3, GFR 30-59 ml/min (HCC) 01/13/2022   Esophageal dysphagia 03/27/2021   Abnormal MRI, spinal cord 01/03/2021   Bilateral leg numbness 12/31/2020   Laryngopharyngeal reflux (LPR) 06/19/2020   Frequent falls 04/17/2020   Restless leg syndrome 01/23/2020   Obstructive sleep apnea 12/04/2019   Diabetic autonomic neuropathy (HCC) 01/24/2019   Chronically dry eyes, right 12/15/2018   Hypertension associated with diabetes (HCC) 10/17/2018   HFpEF 70-75% 07/15/2017   Mild aortic stenosis 06/19/2017   Moderate pulmonary valve insufficiency  06/19/2017   Lumbar back pain with radiculopathy affecting right lower extremity 10/29/2016   Allergic rhinitis 03/22/2011   INSOMNIA 09/16/2009   Gastroparesis 08/12/2009   Anxiety 10/18/2008   Diabetes mellitus, type II (HCC) 02/16/2007   Hyperlipidemia associated with type 2 diabetes mellitus (HCC) 02/16/2007   GASTROESOPHAGEAL REFLUX, NO ESOPHAGITIS 02/16/2007    Osteoarthritis of both knees 02/16/2007   Osteopenia after menopause 02/16/2007   Gout 02/16/2007   Past Surgical History:  Procedure Laterality Date   KIDNEY SURGERY     KNEE SURGERY     MENISECTOMY     OTHER SURGICAL HISTORY Left    Epiretinal Peel Dr Nile Riggs (Ophth)   WRIST SURGERY     Current Outpatient Medications on File Prior to Visit  Medication Sig Dispense Refill   acetaminophen (TYLENOL) 325 MG tablet Take 325 mg by mouth every 8 (eight) hours as needed (pain).  (Patient not taking: Reported on 04/20/2023)     allopurinol (ZYLOPRIM) 100 MG tablet Take 1 tablet (100 mg total) by mouth daily. 90 tablet 3   amLODipine (NORVASC) 10 MG tablet Take 1 tablet (10 mg total) by mouth daily. 90 tablet 3   aspirin 81 MG chewable tablet Chew 1 tablet (81 mg total) by mouth daily. 30 tablet 5   busPIRone (BUSPAR) 15 MG tablet TAKE 1 TABLET BY MOUTH TWICE A DAY 180 tablet 1   furosemide (LASIX) 20 MG tablet Take 1 tablet (20 mg total) by mouth daily as needed. Take one dose Lasix if you develop 3 pound weight gain or more, worsening leg swelling, or shortness of breath (Patient not taking: Reported on 04/20/2023) 30 tablet 1   guaiFENesin (MUCINEX) 600 MG 12 hr tablet Take 2 tablets (1,200 mg total) by mouth 2 (two) times daily as needed. (Patient not taking: Reported on 04/20/2023)     insulin aspart (FIASP) 100 UNIT/ML FlexTouch Pen INJECT 15 UNITS SUBCUTANEOUSLY DAILY WITH BREAKFAST, INJECT 9 UNITS WITH LUNCH AS NEEDED FOR CBG 180 OR MORE, AND INJECT 9 UNITS WITH SUPPER (Patient taking differently: Inject 0-15 Units into the skin 3 (three) times daily as needed (HGC). Per sliding scale.) 9 mL 1   levothyroxine (SYNTHROID) 50 MCG tablet Take 1 tablet (50 mcg total) by mouth every morning. 30 minutes before food 90 tablet 3   loperamide (IMODIUM A-D) 2 MG tablet Take 4 mg by mouth as needed for diarrhea or loose stools. (Patient not taking: Reported on 04/20/2023)     Multiple Vitamin (MULTI VITAMIN)  TABS Take 1 tablet by mouth daily.     OneTouch Delica Lancets 33G MISC Please use to check blood sugar up to 4 times daily. E11.9 200 each 12   polyethylene glycol powder (GLYCOLAX/MIRALAX) 17 GM/SCOOP powder Take 17 g by mouth daily. (Patient taking differently: Take 17 g by mouth daily as needed for mild constipation or moderate constipation.) 3350 g 1   sertraline (ZOLOFT) 100 MG tablet Take 1 tablet (100 mg total) by mouth daily. (Patient taking differently: Take 100 mg by mouth in the morning. Take along with 50 mg twice daily for a total daily dose of 200mg .) 90 tablet 4   sertraline (ZOLOFT) 50 MG tablet TAKE 1 TABLET IN THE AFTERNOON, ONE DOSE AT BEDTIME (Patient taking differently: Take 50 mg by mouth 2 (two) times daily. At lunch and at bedtime. Take along with 100 mg tablet for a total daily dose of 200 mg.) 180 tablet 4   spironolactone (ALDACTONE) 25 MG tablet Take  1 tablet (25 mg total) by mouth daily.     triamcinolone ointment (KENALOG) 0.5 % Apply 1 Application topically 2 (two) times daily. 30 g 0   valsartan (DIOVAN) 320 MG tablet TAKE 1 TABLET BY MOUTH EVERY DAY (Patient taking differently: Take 320 mg by mouth daily.) 90 tablet 2   Vitamin E 400 units TABS Take 400 Units by mouth daily.     No current facility-administered medications on file prior to visit.    Allergies  Allergen Reactions   Baclofen Other (See Comments)    Caused leg weakness   Hydrochlorothiazide Other (See Comments)    Uric acid elevation and gout.      Liraglutide Other (See Comments)    Tongue Glossitus   Losartan Potassium     Other reaction(s): diarrhea   Lotensin [Benazepril] Other (See Comments)    Dry cough    Benazepril Hcl     Other reaction(s): dry cough   Metoprolol Succinate [Metoprolol]     Other reaction(s): coughing   Beta Adrenergic Blockers Other (See Comments)    Occurred with metoprolol  coughing   Social History   Occupational History   Occupation: retired-office work   Tobacco Use   Smoking status: Never   Smokeless tobacco: Never  Vaping Use   Vaping Use: Never used  Substance and Sexual Activity   Alcohol use: No   Drug use: No   Sexual activity: Not Currently   Family History  Problem Relation Age of Onset   Stroke Mother    Heart disease Father    Stroke Father    Hypertension Father    Cancer Sister    Cancer Brother    Heart disease Brother    Diabetes Maternal Uncle    Immunization History  Administered Date(s) Administered   COVID-19, mRNA, vaccine(Comirnaty)12 years and older 01/06/2023   Fluad Quad(high Dose 65+) 08/28/2021, 09/10/2022   Influenza Split 11/05/2011, 09/20/2012   Influenza Whole 11/15/2007, 10/24/2009, 09/25/2010   Influenza, High Dose Seasonal PF 09/07/2018   Influenza,inj,Quad PF,6+ Mos 10/08/2014, 10/10/2015, 09/26/2017, 12/03/2020   Influenza-Unspecified 10/19/2013, 10/03/2016, 10/26/2019   PFIZER Comirnaty(Gray Top)Covid-19 Tri-Sucrose Vaccine 06/09/2021   PFIZER(Purple Top)SARS-COV-2 Vaccination 02/22/2020, 03/25/2020, 10/09/2020   Pfizer Covid-19 Vaccine Bivalent Booster 67yrs & up 01/12/2022   Pneumococcal Conjugate-13 09/26/2017   Pneumococcal Polysaccharide-23 10/20/2004   Td 10/20/2002   Tdap 11/21/2017     Review of Systems: Negative except as noted in the HPI.   Objective: There were no vitals filed for this visit.  Symphani C Elsey is a pleasant 82 y.o. female in NAD. AAO X 3.  Vascular Examination: Capillary refill time immediate b/l. Vascular status intact b/l with palpable pedal pulses. Pedal hair sparse b/l. No edema. No pain with calf compression b/l. Skin temperature gradient WNL b/l. No cyanosis or clubbing noted b/l LE.  Neurological Examination: Sensation grossly intact b/l with 10 gram monofilament. Vibratory sensation intact b/l. Pt has subjective symptoms of neuropathy.  Dermatological Examination: Pedal skin with normal turgor, texture and tone b/l.  No open wounds. No  interdigital macerations.   Toenails 1-5 b/l thick, discolored, elongated with subungual debris and pain on dorsal palpation.   No hyperkeratotic nor porokeratotic lesions present on today's visit.  Musculoskeletal Examination: Hammertoe(s) noted to the left second digit and right second digit. Utilizes rollator for ambulation assistance.  Radiographs: None  Last A1c:      Latest Ref Rng & Units 02/15/2023    2:02 AM 01/06/2023   11:39  AM 09/30/2022    2:28 PM 07/06/2022    1:50 PM  Hemoglobin A1C  Hemoglobin-A1c 4.8 - 5.6 % 7.9  7.7  8.2  7.9     Lab Results  Component Value Date   HGBA1C 7.9 (H) 02/15/2023   ADA Risk Categorization: Low Risk :  Patient has all of the following: Intact protective sensation No prior foot ulcer  No severe deformity Pedal pulses present  Assessment: 1. Pain due to onychomycosis of toenails of both feet   2. Acquired hammertoes of both feet   3. Type 2 diabetes mellitus with diabetic autonomic neuropathy, with long-term current use of insulin (HCC)   4. Encounter for diabetic foot exam (HCC)     Plan: No orders of the defined types were placed in this encounter. -Consent given for treatment as described below: -Examined patient. -Continue supportive shoe gear daily. -Mycotic toenails 1-5 bilaterally were debrided in length and girth with sterile nail nippers and dremel without incident. -Patient/POA to call should there be question/concern in the interim. Return in about 3 months (around 08/03/2023).  Freddie Breech, DPM

## 2023-05-06 NOTE — Telephone Encounter (Signed)
Received pharmacy request for placement order for insulin Basaglar.  Stated the insurance did not cover this. This is curious, as I recently sent in a Lantus prescription for her.  She does not have Basaglar on her list. I will simply deny this basaglar replacement request.   She is currently using insulin glargine (Lantus) 64 units daily and insulin aspart (Fiasp) with sliding scale of 0 to 15 units 3 times daily as needed.  This patient is insulin-dependent carries a known diagnosis of type 2 diabetes, ICD 10 code E11.9.  She uses glucometer supplies to test 4 times daily with PRN usage in addition in case of suspected hypoglycemia.  Fayette Pho, MD

## 2023-05-08 ENCOUNTER — Other Ambulatory Visit: Payer: Self-pay | Admitting: Family Medicine

## 2023-05-08 DIAGNOSIS — I5032 Chronic diastolic (congestive) heart failure: Secondary | ICD-10-CM

## 2023-05-08 DIAGNOSIS — N1831 Chronic kidney disease, stage 3a: Secondary | ICD-10-CM

## 2023-05-08 DIAGNOSIS — E1159 Type 2 diabetes mellitus with other circulatory complications: Secondary | ICD-10-CM

## 2023-05-08 NOTE — Patient Instructions (Signed)
Rebecca Walter , Thank you for taking time to come for your Medicare Wellness Visit. I appreciate your ongoing commitment to your health goals. Please review the following plan we discussed and let me know if I can assist you in the future.   These are the goals we discussed:  Goals      HEMOGLOBIN A1C < 7     7.7 08/2021.      Prevent falls     Weight (lb) < 180 lb (81.6 kg)        This is a list of the screening recommended for you and due dates:  Health Maintenance  Topic Date Due   Zoster (Shingles) Vaccine (1 of 2) Never done   Eye exam for diabetics  05/27/2022   COVID-19 Vaccine (7 - 2023-24 season) 01/08/2024*   Flu Shot  07/21/2023   Hemoglobin A1C  08/16/2023   Yearly kidney health urinalysis for diabetes  04/14/2024   Yearly kidney function blood test for diabetes  04/20/2024   Complete foot exam   05/02/2024   Medicare Annual Wellness Visit  05/05/2024   DTaP/Tdap/Td vaccine (3 - Td or Tdap) 11/22/2027   Pneumonia Vaccine  Completed   DEXA scan (bone density measurement)  Completed   HPV Vaccine  Aged Out  *Topic was postponed. The date shown is not the original due date.    Advanced directives: We have a copy of your advanced directives available in your record should your provider ever need to access them.   Conditions/risks identified: Aim for 30 minutes of exercise or brisk walking, 6-8 glasses of water, and 5 servings of fruits and vegetables each day.  Next appointment: Follow up in one year for your annual wellness visit    Preventive Care 65 Years and Older, Female Preventive care refers to lifestyle choices and visits with your health care provider that can promote health and wellness. What does preventive care include? A yearly physical exam. This is also called an annual well check. Dental exams once or twice a year. Routine eye exams. Ask your health care provider how often you should have your eyes checked. Personal lifestyle choices,  including: Daily care of your teeth and gums. Regular physical activity. Eating a healthy diet. Avoiding tobacco and drug use. Limiting alcohol use. Practicing safe sex. Taking low-dose aspirin every day. Taking vitamin and mineral supplements as recommended by your health care provider. What happens during an annual well check? The services and screenings done by your health care provider during your annual well check will depend on your age, overall health, lifestyle risk factors, and family history of disease. Counseling  Your health care provider may ask you questions about your: Alcohol use. Tobacco use. Drug use. Emotional well-being. Home and relationship well-being. Sexual activity. Eating habits. History of falls. Memory and ability to understand (cognition). Work and work Astronomer. Reproductive health. Screening  You may have the following tests or measurements: Height, weight, and BMI. Blood pressure. Lipid and cholesterol levels. These may be checked every 5 years, or more frequently if you are over 75 years old. Skin check. Lung cancer screening. You may have this screening every year starting at age 35 if you have a 30-pack-year history of smoking and currently smoke or have quit within the past 15 years. Fecal occult blood test (FOBT) of the stool. You may have this test every year starting at age 29. Flexible sigmoidoscopy or colonoscopy. You may have a sigmoidoscopy every 5 years or a colonoscopy  every 10 years starting at age 40. Hepatitis C blood test. Hepatitis B blood test. Sexually transmitted disease (STD) testing. Diabetes screening. This is done by checking your blood sugar (glucose) after you have not eaten for a while (fasting). You may have this done every 1-3 years. Bone density scan. This is done to screen for osteoporosis. You may have this done starting at age 60. Mammogram. This may be done every 1-2 years. Talk to your health care provider  about how often you should have regular mammograms. Talk with your health care provider about your test results, treatment options, and if necessary, the need for more tests. Vaccines  Your health care provider may recommend certain vaccines, such as: Influenza vaccine. This is recommended every year. Tetanus, diphtheria, and acellular pertussis (Tdap, Td) vaccine. You may need a Td booster every 10 years. Zoster vaccine. You may need this after age 64. Pneumococcal 13-valent conjugate (PCV13) vaccine. One dose is recommended after age 49. Pneumococcal polysaccharide (PPSV23) vaccine. One dose is recommended after age 58. Talk to your health care provider about which screenings and vaccines you need and how often you need them. This information is not intended to replace advice given to you by your health care provider. Make sure you discuss any questions you have with your health care provider. Document Released: 01/02/2016 Document Revised: 08/25/2016 Document Reviewed: 10/07/2015 Elsevier Interactive Patient Education  2017 [employer].  Fall Prevention in the Home Falls can cause injuries. They can happen to people of all ages. There are many things you can do to make your home safe and to help prevent falls. What can I do on the outside of my home? Regularly fix the edges of walkways and driveways and fix any cracks. Remove anything that might make you trip as you walk through a door, such as a raised step or threshold. Trim any bushes or trees on the path to your home. Use bright outdoor lighting. Clear any walking paths of anything that might make someone trip, such as rocks or tools. Regularly check to see if handrails are loose or broken. Make sure that both sides of any steps have handrails. Any raised decks and porches should have guardrails on the edges. Have any leaves, snow, or ice cleared regularly. Use sand or salt on walking paths during winter. Clean up any spills in  your garage right away. This includes oil or grease spills. What can I do in the bathroom? Use night lights. Install grab bars by the toilet and in the tub and shower. Do not use towel bars as grab bars. Use non-skid mats or decals in the tub or shower. If you need to sit down in the shower, use a plastic, non-slip stool. Keep the floor dry. Clean up any water that spills on the floor as soon as it happens. Remove soap buildup in the tub or shower regularly. Attach bath mats securely with double-sided non-slip rug tape. Do not have throw rugs and other things on the floor that can make you trip. What can I do in the bedroom? Use night lights. Make sure that you have a light by your bed that is easy to reach. Do not use any sheets or blankets that are too big for your bed. They should not hang down onto the floor. Have a firm chair that has side arms. You can use this for support while you get dressed. Do not have throw rugs and other things on the floor that can make  you trip. What can I do in the kitchen? Clean up any spills right away. Avoid walking on wet floors. Keep items that you use a lot in easy-to-reach places. If you need to reach something above you, use a strong step stool that has a grab bar. Keep electrical cords out of the way. Do not use floor polish or wax that makes floors slippery. If you must use wax, use non-skid floor wax. Do not have throw rugs and other things on the floor that can make you trip. What can I do with my stairs? Do not leave any items on the stairs. Make sure that there are handrails on both sides of the stairs and use them. Fix handrails that are broken or loose. Make sure that handrails are as long as the stairways. Check any carpeting to make sure that it is firmly attached to the stairs. Fix any carpet that is loose or worn. Avoid having throw rugs at the top or bottom of the stairs. If you do have throw rugs, attach them to the floor with carpet  tape. Make sure that you have a light switch at the top of the stairs and the bottom of the stairs. If you do not have them, ask someone to add them for you. What else can I do to help prevent falls? Wear shoes that: Do not have high heels. Have rubber bottoms. Are comfortable and fit you well. Are closed at the toe. Do not wear sandals. If you use a stepladder: Make sure that it is fully opened. Do not climb a closed stepladder. Make sure that both sides of the stepladder are locked into place. Ask someone to hold it for you, if possible. Clearly mark and make sure that you can see: Any grab bars or handrails. First and last steps. Where the edge of each step is. Use tools that help you move around (mobility aids) if they are needed. These include: Canes. Walkers. Scooters. Crutches. Turn on the lights when you go into a dark area. Replace any light bulbs as soon as they burn out. Set up your furniture so you have a clear path. Avoid moving your furniture around. If any of your floors are uneven, fix them. If there are any pets around you, be aware of where they are. Review your medicines with your doctor. Some medicines can make you feel dizzy. This can increase your chance of falling. Ask your doctor what other things that you can do to help prevent falls. This information is not intended to replace advice given to you by your health care provider. Make sure you discuss any questions you have with your health care provider. Document Released: 10/02/2009 Document Revised: 05/13/2016 Document Reviewed: 01/10/2015 Elsevier Interactive Patient Education  2017 [employer].

## 2023-05-08 NOTE — Progress Notes (Addendum)
Subjective:   Rebecca Walter is a 82 y.o. female who presents for Medicare Annual (Subsequent) preventive examination.  I connected with  Rebecca Walter on 05/06/23 by a audio enabled telemedicine application and verified that I am speaking with the correct person using two identifiers.  Patient Location: Home  Provider Location: Home Office  I discussed the limitations of evaluation and management by telemedicine. The patient expressed understanding and agreed to proceed.  Review of Systems     Cardiac Risk Factors include: advanced age (>7men, >1 women);diabetes mellitus;sedentary lifestyle;dyslipidemia;hypertension     Objective:    Today's Vitals   05/06/23 1500  Weight: 198 lb (89.8 kg)  Height: 5' (1.524 m)   Body mass index is 38.67 kg/m.     04/22/2023    4:31 PM 04/20/2023    4:02 PM 04/15/2023    2:36 PM 02/13/2023    6:00 PM 02/10/2023    2:59 PM 12/10/2022    1:32 PM 12/03/2022    2:39 PM  Advanced Directives  Does Patient Have a Medical Advance Directive? Yes Yes Yes Yes No Yes Yes  Type of Advance Directive Living will Living will Living will;Healthcare Power of [employer] Power of [place];Living will  Healthcare Power of [employer] Power of Attorney  Does patient want to make changes to medical advance directive?  No - Patient declined No - Patient declined No - Patient declined     Copy of Healthcare Power of Attorney in Chart? Yes - validated most recent copy scanned in chart (See row information) Yes - validated most recent copy scanned in chart (See row information) Yes - validated most recent copy scanned in chart (See row information) Yes - validated most recent copy scanned in chart (See row information)     Would patient like information on creating a medical advance directive?    No - Patient declined No - Patient declined      Current Medications (verified) Outpatient Encounter Medications as of 05/06/2023  Medication Sig    allopurinol (ZYLOPRIM) 100 MG tablet Take 1 tablet (100 mg total) by mouth daily.   amLODipine (NORVASC) 10 MG tablet Take 1 tablet (10 mg total) by mouth daily.   aspirin 81 MG chewable tablet Chew 1 tablet (81 mg total) by mouth daily.   busPIRone (BUSPAR) 15 MG tablet TAKE 1 TABLET BY MOUTH TWICE A DAY   Hydrocortisone, Perianal, 1 % CREA APPLY TO AFFECTED AREA TWICE A DAY   hydrOXYzine (ATARAX) 10 MG tablet Take 1 tablet (10 mg total) by mouth 3 (three) times daily as needed for anxiety.   insulin aspart (FIASP) 100 UNIT/ML FlexTouch Pen INJECT 15 UNITS SUBCUTANEOUSLY DAILY WITH BREAKFAST, INJECT 9 UNITS WITH LUNCH AS NEEDED FOR CBG 180 OR MORE, AND INJECT 9 UNITS WITH SUPPER (Patient taking differently: Inject 0-15 Units into the skin 3 (three) times daily as needed (HGC). Per sliding scale.)   insulin glargine (LANTUS SOLOSTAR) 100 UNIT/ML Solostar Pen INJECT 64 UNITS INTO THE SKIN DAILY.   Insulin Pen Needle 32G X 6 MM MISC Use to dispense insulin daily as instructed by physician.   levothyroxine (SYNTHROID) 50 MCG tablet Take 1 tablet (50 mcg total) by mouth every morning. 30 minutes before food   Multiple Vitamin (MULTI VITAMIN) TABS Take 1 tablet by mouth daily.   OneTouch Delica Lancets 33G MISC Please use to check blood sugar up to 4 times daily. E11.9   pantoprazole (PROTONIX) 40 MG tablet TAKE 1 TABLET  BY MOUTH TWICE A DAY   polyethylene glycol powder (GLYCOLAX/MIRALAX) 17 GM/SCOOP powder Take 17 g by mouth daily. (Patient taking differently: Take 17 g by mouth daily as needed for mild constipation or moderate constipation.)   pravastatin (PRAVACHOL) 40 MG tablet TAKE 1 TABLET BY MOUTH ONE TIME A DAY   sertraline (ZOLOFT) 100 MG tablet Take 1 tablet (100 mg total) by mouth daily. (Patient taking differently: Take 100 mg by mouth in the morning. Take along with 50 mg twice daily for a total daily dose of 200mg .)   sertraline (ZOLOFT) 50 MG tablet TAKE 1 TABLET IN THE AFTERNOON, ONE DOSE  AT BEDTIME (Patient taking differently: Take 50 mg by mouth 2 (two) times daily. At lunch and at bedtime. Take along with 100 mg tablet for a total daily dose of 200 mg.)   spironolactone (ALDACTONE) 25 MG tablet Take 1 tablet (25 mg total) by mouth daily.   triamcinolone ointment (KENALOG) 0.5 % Apply 1 Application topically 2 (two) times daily.   valsartan (DIOVAN) 320 MG tablet TAKE 1 TABLET BY MOUTH EVERY DAY (Patient taking differently: Take 320 mg by mouth daily.)   Vitamin E 400 units TABS Take 400 Units by mouth daily.   acetaminophen (TYLENOL) 325 MG tablet Take 325 mg by mouth every 8 (eight) hours as needed (pain).  (Patient not taking: Reported on 04/20/2023)   furosemide (LASIX) 20 MG tablet Take 1 tablet (20 mg total) by mouth daily as needed. Take one dose Lasix if you develop 3 pound weight gain or more, worsening leg swelling, or shortness of breath (Patient not taking: Reported on 04/20/2023)   guaiFENesin (MUCINEX) 600 MG 12 hr tablet Take 2 tablets (1,200 mg total) by mouth 2 (two) times daily as needed. (Patient not taking: Reported on 04/20/2023)   loperamide (IMODIUM A-D) 2 MG tablet Take 4 mg by mouth as needed for diarrhea or loose stools. (Patient not taking: Reported on 04/20/2023)   No facility-administered encounter medications on file as of 05/06/2023.    Allergies (verified) Baclofen, Hydrochlorothiazide, Liraglutide, Losartan potassium, Lotensin [benazepril], Benazepril hcl, Metoprolol succinate [metoprolol], and Beta adrenergic blockers   History: Past Medical History:  Diagnosis Date   Acute pain of both knees 04/04/2022   Anxiety    Arthritis    Basal cell carcinoma 03/02/2021   Candidiasis of skin 02/14/2020   CARCINOMA, BASAL CELL 05/26/2007   Qualifier: History of  By: Lula Olszewski MD, Rachel     Carpal tunnel syndrome of right wrist 11/26/2020   Cellulitis of left lower extremity 12/03/2022   Cervical spinal stenosis 05/11/2022   MRI Brain (05/11/22) shows  moderate cervical spinal stenosis   Chronic diastolic CHF (congestive heart failure) (HCC)    Echocardiogram 01/2020: EF 70-75, no RWMA, Gr 1 DD, normal RVSF, RVSP 45.8, mild to mod LAE, trivial MR, mild PI   Coronavirus infection 02/16/2023   Diabetes mellitus    Diarrhea of presumed infectious origin 01/16/2021   DYSKINESIA, ESOPHAGUS 09/13/2007   Qualifier: Diagnosis of  By: Clelia Croft MD, Kimberlee     Early satiety 09/04/2020   Elevated TSH 11/21/2017   Epistaxis 02/04/2020   Generalized weakness 12/15/2018   GERD (gastroesophageal reflux disease)    Gout    History of melanoma 01/31/2021   History of nuclear stress test    Myoview 06/2020: EF 75, no ischemia or infarction, low risk   Hoarseness 11/27/2019   Hyperlipidemia    Hypertension    Hypertensive heart disease with CHF (congestive  heart failure) (HCC)    Hyponatremia    Leg fatigue 01/03/2021   Mild aortic stenosis 06/2017   Mild cognitive impairment 09/03/2022   Mild mitral regurgitation 06/2017   Moderate pulmonary valve insufficiency 06/2017   OBESITY, NOS 02/16/2007   Qualifier: Diagnosis of  By: Haydee Salter     Osteopenia    Pneumonia of both lower lobes due to infectious organism 02/11/2023   Sepsis due to Acinetobacter baumannii with acute hypoxic respiratory failure without septic shock (HCC) 02/16/2023   SOB (shortness of breath) 02/11/2023   Vertigo 08/29/2013   Past Surgical History:  Procedure Laterality Date   KIDNEY SURGERY     KNEE SURGERY     MENISECTOMY     OTHER SURGICAL HISTORY Left    Epiretinal Peel Dr Nile Riggs (Ophth)   WRIST SURGERY     Family History  Problem Relation Age of Onset   Stroke Mother    Heart disease Father    Stroke Father    Hypertension Father    Cancer Sister    Cancer Brother    Heart disease Brother    Diabetes Maternal Uncle    Social History   Socioeconomic History   Marital status: Widowed    Spouse name: Not on file   Number of children: 1   Years of  education: 47   Highest education level: Not on file  Occupational History   Occupation: retired-office work  Tobacco Use   Smoking status: Never   Smokeless tobacco: Never  Vaping Use   Vaping Use: Never used  Substance and Sexual Activity   Alcohol use: No   Drug use: No   Sexual activity: Not Currently  Other Topics Concern   Not on file  Social History Narrative   Patient lives alone in [place], she is a widow.    Patient has one son who checks in on her daily and helps with various things.   One granddaughter in [place].    Patient enjoys watching TV, zoom bible study, plants and cooking.    Patient has an aide who comes MWF.   Social Determinants of Health   Financial Resource Strain: Low Risk  (05/08/2023)   Overall Financial Resource Strain (CARDIA)    Difficulty of Paying Living Expenses: Not hard at all  Food Insecurity: No Food Insecurity (05/08/2023)   Hunger Vital Sign    Worried About Running Out of Food in the Last Year: Never true    Ran Out of Food in the Last Year: Never true  Transportation Needs: No Transportation Needs (05/08/2023)   PRAPARE - Administrator, Civil Service (Medical): No    Lack of Transportation (Non-Medical): No  Physical Activity: Inactive (05/08/2023)   Exercise Vital Sign    Days of Exercise per Week: 0 days    Minutes of Exercise per Session: 0 min  Stress: No Stress Concern Present (05/08/2023)   [employer] of Occupational Health - Occupational Stress Questionnaire    Feeling of Stress : Not at all  Social Connections: Moderately Integrated (05/08/2023)   Social Connection and Isolation Panel [NHANES]    Frequency of Communication with Friends and Family: More than three times a week    Frequency of Social Gatherings with Friends and Family: Three times a week    Attends Religious Services: More than 4 times per year    Active Member of Clubs or Organizations: Yes    Attends Banker Meetings: More  than 4  times per year    Marital Status: Widowed    Tobacco Counseling Counseling given: Not Answered   Clinical Intake:  Pre-visit preparation completed: Yes  Pain : No/denies pain  Diabetes: Yes CBG done?: No Did pt. bring in CBG monitor from home?: No  How often do you need to have someone help you when you read instructions, pamphlets, or other written materials from your doctor or pharmacy?: 1 - Never  Diabetic?Yes   Nutrition Risk Assessment:  Has the patient had any N/V/D within the last 2 months?  No  Does the patient have any non-healing wounds?  No  Has the patient had any unintentional weight loss or weight gain?  No   Diabetes:  Is the patient diabetic?  Yes  If diabetic, was a CBG obtained today?  No  Did the patient bring in their glucometer from home?  No  How often do you monitor your CBG's? daily.   Financial Strains and Diabetes Management:  Are you having any financial strains with the device, your supplies or your medication? No .  Does the patient want to be seen by Chronic Care Management for management of their diabetes?  No  Would the patient like to be referred to a Nutritionist or for Diabetic Management?  No   Diabetic Exams:  Diabetic Eye Exam: Completed records requested Diabetic Foot Exam: Completed 05/03/23   Interpreter Needed?: No  Information entered by :: Kandis Fantasia LPN   Activities of Daily Living    05/08/2023    8:49 PM 02/13/2023    6:00 PM  In your present state of health, do you have any difficulty performing the following activities:  Hearing? 0 1  Vision? 0 0  Difficulty concentrating or making decisions? 0 0  Walking or climbing stairs? 1 1  Dressing or bathing? 0 1  Doing errands, shopping? 1 0  Preparing Food and eating ? N   Using the Toilet? N   In the past six months, have you accidently leaked urine? N   Do you have problems with loss of bowel control? N   Managing your Medications? N   Managing  your Finances? N   Housekeeping or managing your Housekeeping? Y     Patient Care Team: Fayette Pho, MD as PCP - General (Family Medicine) Pricilla Riffle, MD as PCP - Cardiology (Cardiology) Luciana Axe Alford Highland, MD (Retina Ophthalmology) Graylin Shiver, MD as Consulting Physician (Gastroenterology) Freddie Breech, DPM as Consulting Physician (Podiatry) Elwin Mocha, MD as Consulting Physician (Ophthalmology)  Indicate any recent Medical Services you may have received from other than [facility] providers in the past year (date may be approximate).     Assessment:   This is a routine wellness examination for Rebecca Walter.  Hearing/Vision screen Hearing Screening - Comments:: Denies hearing difficulties   Vision Screening - Comments:: Wears rx glasses - up to date with routine eye exams with [facility]    Dietary issues and exercise activities discussed: Current Exercise Habits: The patient does not participate in regular exercise at present   Goals Addressed             This Visit's Progress    Prevent falls        Depression Screen    05/08/2023    8:41 PM 04/22/2023    4:40 PM 04/20/2023    4:02 PM 04/15/2023    2:36 PM 03/17/2023    4:24 PM 03/15/2023    2:24 PM 02/10/2023  2:59 PM  PHQ 2/9 Scores  PHQ - 2 Score 0 0 0 0 0 0 0  PHQ- 9 Score 2 2 2 1 1 3 4     Fall Risk    05/08/2023    8:49 PM 04/22/2023    4:40 PM 04/15/2023    2:36 PM 03/17/2023    4:25 PM 03/15/2023    2:24 PM  Fall Risk   Falls in the past year? 1 1 1  0 0  Number falls in past yr: 0 0 0 0   Injury with Fall? 0 0 0    Risk for fall due to : Impaired balance/gait;Impaired mobility;History of fall(s)      Follow up Falls prevention discussed;Education provided;Falls evaluation completed        FALL RISK PREVENTION PERTAINING TO THE HOME:  Any stairs in or around the home? No  If so, are there any without handrails? No  Home free of loose throw rugs in walkways, pet beds, electrical cords, etc? Yes   Adequate lighting in your home to reduce risk of falls? Yes   ASSISTIVE DEVICES UTILIZED TO PREVENT FALLS:  Life alert? No  Use of a cane, walker or w/c? Yes  Grab bars in the bathroom? Yes  Shower chair or bench in shower? No  Elevated toilet seat or a handicapped toilet? Yes   TIMED UP AND GO:  Was the test performed? No . Telephonic visit   Cognitive Function:    09/13/2012   11:00 AM  MMSE - Mini Mental State Exam  Orientation to time 5  Orientation to Place 5  Registration 3  Attention/ Calculation 5  Recall 2  Language- name 2 objects 2  Language- repeat 1  Language- follow 3 step command 3  Language- read & follow direction 1  Write a sentence 1  Copy design 1  Total score 29        05/08/2023    8:49 PM 11/17/2021    3:48 PM 12/26/2019    2:05 PM 05/17/2019    3:09 PM  6CIT Screen  What Year? 0 points 0 points 0 points 0 points  What month? 0 points 0 points 0 points 0 points  What time? 0 points 0 points 0 points 0 points  Count back from 20 0 points 0 points 0 points 0 points  Months in reverse 0 points 0 points 0 points 0 points  Repeat phrase 0 points 0 points 0 points 0 points  Total Score 0 points 0 points 0 points 0 points    Immunizations Immunization History  Administered Date(s) Administered   COVID-19, mRNA, vaccine(Comirnaty)12 years and older 01/06/2023   Fluad Quad(high Dose 65+) 08/28/2021, 09/10/2022   Influenza Split 11/05/2011, 09/20/2012   Influenza Whole 11/15/2007, 10/24/2009, 09/25/2010   Influenza, High Dose Seasonal PF 09/07/2018   Influenza,inj,Quad PF,6+ Mos 10/08/2014, 10/10/2015, 09/26/2017, 12/03/2020   Influenza-Unspecified 10/19/2013, 10/03/2016, 10/26/2019   PFIZER Comirnaty(Gray Top)Covid-19 Tri-Sucrose Vaccine 06/09/2021   PFIZER(Purple Top)SARS-COV-2 Vaccination 02/22/2020, 03/25/2020, 10/09/2020   Pfizer Covid-19 Vaccine Bivalent Booster 64yrs & up 01/12/2022   Pneumococcal Conjugate-13 09/26/2017   Pneumococcal  Polysaccharide-23 10/20/2004   Td 10/20/2002   Tdap 11/21/2017    TDAP status: Up to date  Pneumococcal vaccine status: Up to date  Covid-19 vaccine status: Information provided on how to obtain vaccines.   Qualifies for Shingles Vaccine? Yes   Zostavax completed No   Shingrix Completed?: No.    Education has been provided regarding the importance of  this vaccine. Patient has been advised to call insurance company to determine out of pocket expense if they have not yet received this vaccine. Advised may also receive vaccine at local pharmacy or Health Dept. Verbalized acceptance and understanding.  Screening Tests Health Maintenance  Topic Date Due   Zoster Vaccines- Shingrix (1 of 2) Never done   OPHTHALMOLOGY EXAM  05/27/2022   COVID-19 Vaccine (7 - 2023-24 season) 01/08/2024 (Originally 03/03/2023)   INFLUENZA VACCINE  07/21/2023   HEMOGLOBIN A1C  08/16/2023   Diabetic kidney evaluation - Urine ACR  04/14/2024   Diabetic kidney evaluation - eGFR measurement  04/20/2024   FOOT EXAM  05/02/2024   Medicare Annual Wellness (AWV)  05/05/2024   DTaP/Tdap/Td (3 - Td or Tdap) 11/22/2027   Pneumonia Vaccine 75+ Years old  Completed   DEXA SCAN  Completed   HPV VACCINES  Aged Out    Health Maintenance  Health Maintenance Due  Topic Date Due   Zoster Vaccines- Shingrix (1 of 2) Never done   OPHTHALMOLOGY EXAM  05/27/2022    Colorectal cancer screening: No longer required.   Mammogram status: No longer required due to age and preference.  Bone Density status: Completed 06/19/08. Results reflect: Bone density results: OSTEOPENIA. Repeat every   years.  Lung Cancer Screening: (Low Dose CT Chest recommended if Age 100-80 years, 30 pack-year currently smoking OR have quit w/in 15years.) does not qualify.   Lung Cancer Screening Referral: n/a  Additional Screening:  Hepatitis C Screening: does not qualify;   Vision Screening: Recommended annual ophthalmology exams for early  detection of glaucoma and other disorders of the eye. Is the patient up to date with their annual eye exam?  Yes  Who is the provider or what is the name of the office in which the patient attends annual eye exams? [facility] If pt is not established with a provider, would they like to be referred to a provider to establish care? No .   Dental Screening: Recommended annual dental exams for proper oral hygiene  Community Resource Referral / Chronic Care Management: CRR required this visit?  No   CCM required this visit?  No      Plan:     I have personally reviewed and noted the following in the patient's chart:   Medical and social history Use of alcohol, tobacco or illicit drugs  Current medications and supplements including opioid prescriptions. Patient is not currently taking opioid prescriptions. Functional ability and status Nutritional status Physical activity Advanced directives List of other physicians Hospitalizations, surgeries, and ER visits in previous 12 months Vitals Screenings to include cognitive, depression, and falls Referrals and appointments  In addition, I have reviewed and discussed with patient certain preventive protocols, quality metrics, and best practice recommendations. A written personalized care plan for preventive services as well as general preventive health recommendations were provided to patient.     Durwin Nora, [place]   1/61/0960   Due to this being a virtual visit, the after visit summary with patients personalized plan was offered to patient via mail or my-chart.  per request, patient was mailed a copy of AVS.  Nurse Notes: No concerns

## 2023-05-09 ENCOUNTER — Telehealth: Payer: Self-pay

## 2023-05-09 DIAGNOSIS — I5030 Unspecified diastolic (congestive) heart failure: Secondary | ICD-10-CM | POA: Diagnosis not present

## 2023-05-09 DIAGNOSIS — M6281 Muscle weakness (generalized): Secondary | ICD-10-CM | POA: Diagnosis not present

## 2023-05-09 DIAGNOSIS — M5416 Radiculopathy, lumbar region: Secondary | ICD-10-CM

## 2023-05-09 DIAGNOSIS — M109 Gout, unspecified: Secondary | ICD-10-CM | POA: Diagnosis not present

## 2023-05-09 DIAGNOSIS — G2581 Restless legs syndrome: Secondary | ICD-10-CM

## 2023-05-09 DIAGNOSIS — G4733 Obstructive sleep apnea (adult) (pediatric): Secondary | ICD-10-CM | POA: Diagnosis not present

## 2023-05-09 DIAGNOSIS — J9611 Chronic respiratory failure with hypoxia: Secondary | ICD-10-CM | POA: Diagnosis not present

## 2023-05-09 MED ORDER — BACLOFEN 10 MG PO TABS
10.0000 mg | ORAL_TABLET | Freq: Two times a day (BID) | ORAL | 2 refills | Status: DC | PRN
Start: 2023-05-09 — End: 2023-09-14

## 2023-05-09 NOTE — Telephone Encounter (Signed)
Given this information, I will resend the prescription.   Fayette Pho, MD

## 2023-05-09 NOTE — Telephone Encounter (Signed)
I had been refusing the baclofen requests because her chart indicated an intolerance, stating it made her legs weak.   I will call as able and discuss.   Fayette Pho, MD

## 2023-05-09 NOTE — Telephone Encounter (Signed)
Patient calls nurse line requesting a refill on Baclofen.   I do not see this on her medication list.   Will forward to PCP.

## 2023-05-09 NOTE — Addendum Note (Signed)
Addended by: Valetta Close on: 05/09/2023 12:26 PM   Modules accepted: Orders

## 2023-05-09 NOTE — Telephone Encounter (Signed)
Patient returns call to nurse line. She states that she no longer has intolerance to Baclofen and tolerates this well.   Advised her that I would forward this message to PCP.   Veronda Prude, RN

## 2023-05-10 LAB — HM DIABETES EYE EXAM

## 2023-05-10 NOTE — Progress Notes (Signed)
I have discussed the case with Dr. Lynn.   I agree with their documentation and management in their note for today.    

## 2023-05-13 ENCOUNTER — Encounter: Payer: Self-pay | Admitting: Family Medicine

## 2023-05-13 ENCOUNTER — Ambulatory Visit (INDEPENDENT_AMBULATORY_CARE_PROVIDER_SITE_OTHER): Payer: Medicare HMO | Admitting: Family Medicine

## 2023-05-13 VITALS — BP 134/41 | HR 66 | Wt 196.2 lb

## 2023-05-13 DIAGNOSIS — I10 Essential (primary) hypertension: Secondary | ICD-10-CM

## 2023-05-13 DIAGNOSIS — I5032 Chronic diastolic (congestive) heart failure: Secondary | ICD-10-CM | POA: Diagnosis not present

## 2023-05-13 DIAGNOSIS — N393 Stress incontinence (female) (male): Secondary | ICD-10-CM | POA: Diagnosis not present

## 2023-05-13 DIAGNOSIS — Z23 Encounter for immunization: Secondary | ICD-10-CM | POA: Diagnosis not present

## 2023-05-13 MED ORDER — ZOSTER VAC RECOMB ADJUVANTED 50 MCG/0.5ML IM SUSR
0.5000 mL | Freq: Once | INTRAMUSCULAR | 0 refills | Status: AC
Start: 2023-05-13 — End: 2023-05-13

## 2023-05-13 MED ORDER — AMLODIPINE BESYLATE 5 MG PO TABS
5.0000 mg | ORAL_TABLET | Freq: Every day | ORAL | 1 refills | Status: DC
Start: 2023-05-13 — End: 2023-06-13

## 2023-05-13 MED ORDER — SPIRONOLACTONE 25 MG PO TABS
25.0000 mg | ORAL_TABLET | Freq: Every day | ORAL | 2 refills | Status: DC
Start: 2023-05-13 — End: 2023-05-17

## 2023-05-13 NOTE — Progress Notes (Unsigned)
SUBJECTIVE:   CHIEF COMPLAINT / HPI:   Stress urinary incontinence, concern for prolapse Was last seen for this problem 5/03. At that time, was told to hold spironolactone and use lasix only for strict PRN use. I also recommended timed voiding, reduction of caffeine, and Kegel exercises.   Patient today politely declines pelvic exam saying "not today" Still having urinary incontinence Kegel exercises - doing for the last couple of weeks, unsure if any improvement due to these yet Has not done timed voiding - forgot to set timer Reducing caffeine to one cup of coffee per day  Feels like she is able to empty bladder fully Only leaking with stresses like position changes, coughing, movement, pressing on abdomen.   Using disposable underwear now and using disposable under-pads in bed  Uses about 4 disposable underwear per day Uses 3 disposable under pads for bed and chair per day  "Always had some sort of leakage" - but used to be only panty liner, but after menopause started needing more.  No difference in incontinence after stopping spironolactone  PERTINENT  PMH / PSH:  Patient Active Problem List   Diagnosis Date Noted   Mood disorder (HCC) 09/03/2022    Priority: High   BMI 39.0-39.9,adult 09/02/2022    Priority: High   Morbid obesity (HCC) 06/24/2022    Priority: High   Cerebral atrophy, mild (HCC) 06/24/2022    Priority: High   CKD (chronic kidney disease) stage 3, GFR 30-59 ml/min (HCC) 01/13/2022    Priority: High   Diabetic autonomic neuropathy (HCC) 01/24/2019    Priority: High   Hypertension associated with diabetes (HCC) 10/17/2018    Priority: High   Hyperlipidemia associated with type 2 diabetes mellitus (HCC) 02/16/2007    Priority: High   Mild cognitive impairment 09/03/2022    Priority: Medium    Hypothyroidism 05/05/2022    Priority: Medium    Formed visual hallucinations 04/15/2022    Priority: Medium    Obstructive sleep apnea 12/04/2019     Priority: Medium    Mild aortic stenosis 06/19/2017    Priority: Medium    Moderate pulmonary valve insufficiency 06/19/2017    Priority: Medium    Allergic rhinitis 03/22/2011    Priority: Medium    Gastroparesis 08/12/2009    Priority: Medium    GASTROESOPHAGEAL REFLUX, NO ESOPHAGITIS 02/16/2007    Priority: Medium    Osteopenia after menopause 02/16/2007    Priority: Medium    Gout 02/16/2007    Priority: Medium    Cervical spinal stenosis 05/11/2022    Priority: Low   Esophageal dysphagia 03/27/2021    Priority: Low   Bilateral leg numbness 12/31/2020    Priority: Low   Laryngopharyngeal reflux (LPR) 06/19/2020    Priority: Low   Frequent falls 04/17/2020    Priority: Low   Chronically dry eyes, right 12/15/2018    Priority: Low   Lumbar back pain with radiculopathy affecting right lower extremity 10/29/2016    Priority: Low   INSOMNIA 09/16/2009    Priority: Low   Anxiety 10/18/2008    Priority: Low   Osteoarthritis of both knees 02/16/2007    Priority: Low   Acute hypoxemic respiratory failure (HCC) 02/10/2023    Priority: 1.   HFpEF 70-75% 07/15/2017    Priority: 4.   Diabetes mellitus, type II (HCC) 02/16/2007    Priority: 5.   HYPERTENSION, BENIGN SYSTEMIC 05/15/2023   Acute renal impairment 04/21/2023   Hyperkalemia 04/21/2023   Chronic anemia 04/21/2023  Leg swelling 04/01/2023   Urinary incontinence 04/01/2023   Healthcare maintenance 01/09/2023   Skin rash 12/07/2022   Need for shingles vaccine 01/31/2021   Abnormal MRI, spinal cord 01/03/2021   Restless leg syndrome 01/23/2020    OBJECTIVE:   BP (!) 134/41   Pulse 66   Wt 196 lb 3.2 oz (89 kg)   LMP  (LMP Unknown)   SpO2 94%   BMI 38.32 kg/m    PHQ-9:     05/08/2023    8:41 PM 04/22/2023    4:40 PM 04/20/2023    4:02 PM  Depression screen PHQ 2/9  Decreased Interest 0 0 0  Down, Depressed, Hopeless 0 0 0  PHQ - 2 Score 0 0 0  Altered sleeping 1 1 1   Tired, decreased energy 1 1 1    Change in appetite 0 0 0  Feeling bad or failure about yourself  0 0 0  Trouble concentrating 0 0 0  Moving slowly or fidgety/restless 0 0 0  Suicidal thoughts 0 0 0  PHQ-9 Score 2 2 2   Difficult doing work/chores Not difficult at all Somewhat difficult Somewhat difficult   Physical Exam General: Awake, alert, oriented, no acute distress Respiratory: CTAB Cardiac: RRR, no murmur Extremities: Moving all extremities spontaneously, 2+ BLE edema to level of knee  ASSESSMENT/PLAN:   Urinary incontinence Description today sounds most like stress incontinence. Previous discussions raised concern for pelvic prolapse such as cystocele complicating picture; however, patient politely declines pelvic exam today. Given that stopping spironolactone for the last several weeks has not improved urine incontinence, will restart spironolactone for GDMT of HFpEF.  - START timed voiding - Continue reduction of caffeine - Continue kegel exercises - DME order to [employer] for disposable underwear (size XL, 4/day, 120/month) and under pads (3/day, 90/month)  Need for shingles vaccine Printed script for shingrix provided.   HYPERTENSION, BENIGN SYSTEMIC Systolic pressure too low in clinic today (41). Patient thankfully asymptomatic. Patient previously with script for 10 mg tablets daily and 2.5 mg daily PRN. Was instructed to stop 25 mg daily PRN amodipine several appointments back; however, today patient reports she has been taking 12.5 mg amlodipine daily. This is likely also why her BLE edema is 2+ today.  - REDUCE amlodipine to 5 mg daily - RESTART spironolactone for GDMT of HFpEF  HFpEF 70-75% BLE edema in setting of patient erroneously taking 12.5 mg amlodipine daily.  - RESTART spironolactone for GDMT of HFpEF - amlodipine reduced - patient instructed to take HCTZ daily x 3 days to improve edema - BMP today - follow up one week for HTN recheck, edema recheck, BMP    Fayette Pho, MD [facility]

## 2023-05-13 NOTE — Patient Instructions (Signed)
It was wonderful to see you today. Thank you for allowing me to be a part of your care. Below is a short summary of what we discussed at your visit today:  Leg swelling, blood pressure REDUCE amlodipine from 10 mg to 5 mg. I sent a new script to your pharmacy.  START spironolactone.  TAKE HCTZ Saturday and Sunday to help get fluid off your legs.  Come back in one week to recheck your legs, kidney function, and potassium level.   Urinary incontinence I will try to get disposable underwear and under pads covered through insurance. Someone should be calling you in 1 week or so to coordinate.   Shingles Vaccine I have written a prescription for the shingles vaccine.  Please take this to your pharmacy to have this administered. Your insurance prefers you get this specific vaccine at the pharmacy instead of in clinic.   This can put you on your fanny by making you feel poorly. Only take this vaccine when you feel well.   Please bring all of your medications to every appointment! If you have any questions or concerns, please do not hesitate to contact us via phone or MyChart message.   Fayette Pho, MD

## 2023-05-14 ENCOUNTER — Other Ambulatory Visit: Payer: Self-pay | Admitting: Family Medicine

## 2023-05-14 DIAGNOSIS — J9611 Chronic respiratory failure with hypoxia: Secondary | ICD-10-CM | POA: Diagnosis not present

## 2023-05-14 DIAGNOSIS — I5032 Chronic diastolic (congestive) heart failure: Secondary | ICD-10-CM

## 2023-05-15 DIAGNOSIS — I10 Essential (primary) hypertension: Secondary | ICD-10-CM | POA: Insufficient documentation

## 2023-05-15 NOTE — Assessment & Plan Note (Addendum)
Description today sounds most like stress incontinence. Previous discussions raised concern for pelvic prolapse such as cystocele complicating picture; however, patient politely declines pelvic exam today. Given that stopping spironolactone for the last several weeks has not improved urine incontinence, will restart spironolactone for GDMT of HFpEF.  - START timed voiding - Continue reduction of caffeine - Continue kegel exercises - DME order to [employer] for disposable underwear (size XL, 4/day, 120/month) and under pads (3/day, 90/month)

## 2023-05-15 NOTE — Assessment & Plan Note (Signed)
BLE edema in setting of patient erroneously taking 12.5 mg amlodipine daily.  - RESTART spironolactone for GDMT of HFpEF - amlodipine reduced - patient instructed to take HCTZ daily x 3 days to improve edema - BMP today - follow up one week for HTN recheck, edema recheck, BMP

## 2023-05-15 NOTE — Assessment & Plan Note (Signed)
Systolic pressure too low in clinic today (41). Patient thankfully asymptomatic. Patient previously with script for 10 mg tablets daily and 2.5 mg daily PRN. Was instructed to stop 25 mg daily PRN amodipine several appointments back; however, today patient reports she has been taking 12.5 mg amlodipine daily. This is likely also why her BLE edema is 2+ today.  - REDUCE amlodipine to 5 mg daily - RESTART spironolactone for GDMT of HFpEF

## 2023-05-15 NOTE — Assessment & Plan Note (Signed)
Printed script for shingrix provided.

## 2023-05-19 ENCOUNTER — Telehealth: Payer: Self-pay

## 2023-05-19 NOTE — Telephone Encounter (Signed)
Community message sent to [employer] for incontinence supplies.   Will await response.   Veronda Prude, RN

## 2023-05-20 ENCOUNTER — Ambulatory Visit: Payer: Self-pay | Admitting: Family Medicine

## 2023-05-20 DIAGNOSIS — R32 Unspecified urinary incontinence: Secondary | ICD-10-CM | POA: Diagnosis not present

## 2023-05-20 DIAGNOSIS — N189 Chronic kidney disease, unspecified: Secondary | ICD-10-CM | POA: Diagnosis not present

## 2023-05-20 DIAGNOSIS — K219 Gastro-esophageal reflux disease without esophagitis: Secondary | ICD-10-CM | POA: Diagnosis not present

## 2023-05-20 DIAGNOSIS — Z794 Long term (current) use of insulin: Secondary | ICD-10-CM | POA: Diagnosis not present

## 2023-05-20 DIAGNOSIS — E785 Hyperlipidemia, unspecified: Secondary | ICD-10-CM | POA: Diagnosis not present

## 2023-05-20 DIAGNOSIS — R0902 Hypoxemia: Secondary | ICD-10-CM | POA: Diagnosis not present

## 2023-05-20 DIAGNOSIS — M8580 Other specified disorders of bone density and structure, unspecified site: Secondary | ICD-10-CM | POA: Diagnosis not present

## 2023-05-20 DIAGNOSIS — E039 Hypothyroidism, unspecified: Secondary | ICD-10-CM | POA: Diagnosis not present

## 2023-05-20 DIAGNOSIS — F419 Anxiety disorder, unspecified: Secondary | ICD-10-CM | POA: Diagnosis not present

## 2023-05-20 DIAGNOSIS — F324 Major depressive disorder, single episode, in partial remission: Secondary | ICD-10-CM | POA: Diagnosis not present

## 2023-05-20 DIAGNOSIS — I509 Heart failure, unspecified: Secondary | ICD-10-CM | POA: Diagnosis not present

## 2023-05-20 DIAGNOSIS — Z008 Encounter for other general examination: Secondary | ICD-10-CM | POA: Diagnosis not present

## 2023-05-23 ENCOUNTER — Ambulatory Visit (INDEPENDENT_AMBULATORY_CARE_PROVIDER_SITE_OTHER): Payer: Medicare HMO | Admitting: Family Medicine

## 2023-05-23 ENCOUNTER — Encounter: Payer: Self-pay | Admitting: Family Medicine

## 2023-05-23 VITALS — BP 135/60 | HR 73 | Ht 60.0 in | Wt 193.4 lb

## 2023-05-23 DIAGNOSIS — M7989 Other specified soft tissue disorders: Secondary | ICD-10-CM | POA: Diagnosis not present

## 2023-05-23 DIAGNOSIS — I5032 Chronic diastolic (congestive) heart failure: Secondary | ICD-10-CM

## 2023-05-23 DIAGNOSIS — I10 Essential (primary) hypertension: Secondary | ICD-10-CM | POA: Diagnosis not present

## 2023-05-23 NOTE — Progress Notes (Signed)
    SUBJECTIVE:   CHIEF COMPLAINT / HPI:   HTN - Restarted on spironolactone at last visit - amlodipine was decreased to 5mg  daily - Hasn't spironolactone today  - home measurement this morning was 170/80  BLE Edema - Hx of HFpEF and HTN - Prior to last visit, patient was on higher dose of amlodipine and not taking spironolactone  - Feels like it is improved a little bit - Using Lasix daily - Uses 2L of oxygen when at home, has a portable if needed  PERTINENT  PMH / PSH: HFpEF, HTN, incontinence   OBJECTIVE:   BP 135/60   Pulse 73   Ht 5' (1.524 m)   Wt 193 lb 6.4 oz (87.7 kg)   LMP  (LMP Unknown)   SpO2 96%   BMI 37.77 kg/m   Gen: well-appearing, NAD CV: RRR, no m/r/g appreciated, 2+ pitting edema Bilaterally Pulm: CTAB, no wheezes/crackles  ASSESSMENT/PLAN:   HYPERTENSION, BENIGN SYSTEMIC BP today 135/60, recently restarted spironolactone and decrease amlodipine dosing. - BMP today - Continue spironolactone 25 mg daily - Continue amlodipine 5 mg daily  Leg swelling Chronic with no red flags.  Previously tolerated compression stockings until the last year, question if this could be related to needing new measurements and a different size of stockings.  Did not have any improvement with decreased amlodipine dosage nor spironolactone reinitiation.  Lasix seems to not be improving.  Given the hyperpigmentation pattern, seems to be related to vascular insufficiency and feel that compression stockings will be the most appropriate neck step. - Compression stocking DME prescription given to patient - Continue medications as prescribed     Evelena Leyden, DO [facility]

## 2023-05-23 NOTE — Patient Instructions (Signed)
For your leg swelling I think we need to get the compression stockings. I want you to go back and get re-measured and if you are able to bring your old ones (or if they know which ones you had before) and see if there is a different material we can see if that works.   We are going to check your kidney labs today. Keep your medications the same as previously prescribed by Dr. Larita Fife.

## 2023-05-23 NOTE — Assessment & Plan Note (Signed)
Chronic with no red flags.  Previously tolerated compression stockings until the last year, question if this could be related to needing new measurements and a different size of stockings.  Did not have any improvement with decreased amlodipine dosage nor spironolactone reinitiation.  Lasix seems to not be improving.  Given the hyperpigmentation pattern, seems to be related to vascular insufficiency and feel that compression stockings will be the most appropriate neck step. - Compression stocking DME prescription given to patient - Continue medications as prescribed

## 2023-05-23 NOTE — Assessment & Plan Note (Signed)
BP today 135/60, recently restarted spironolactone and decrease amlodipine dosing. - BMP today - Continue spironolactone 25 mg daily - Continue amlodipine 5 mg daily

## 2023-05-24 ENCOUNTER — Encounter: Payer: Self-pay | Admitting: Family Medicine

## 2023-05-24 LAB — BASIC METABOLIC PANEL
BUN/Creatinine Ratio: 43 — ABNORMAL HIGH (ref 12–28)
BUN: 52 mg/dL — ABNORMAL HIGH (ref 8–27)
CO2: 23 mmol/L (ref 20–29)
Calcium: 9.2 mg/dL (ref 8.7–10.3)
Chloride: 102 mmol/L (ref 96–106)
Creatinine, Ser: 1.2 mg/dL — ABNORMAL HIGH (ref 0.57–1.00)
Glucose: 197 mg/dL — ABNORMAL HIGH (ref 70–99)
Potassium: 5.1 mmol/L (ref 3.5–5.2)
Sodium: 140 mmol/L (ref 134–144)
eGFR: 45 mL/min/{1.73_m2} — ABNORMAL LOW (ref >59)

## 2023-06-03 ENCOUNTER — Other Ambulatory Visit: Payer: Self-pay | Admitting: Family Medicine

## 2023-06-12 ENCOUNTER — Other Ambulatory Visit: Payer: Self-pay | Admitting: Family Medicine

## 2023-06-12 DIAGNOSIS — I10 Essential (primary) hypertension: Secondary | ICD-10-CM

## 2023-07-25 ENCOUNTER — Telehealth: Payer: Self-pay | Admitting: Pharmacist

## 2023-07-25 NOTE — Telephone Encounter (Signed)
Patient contacted for follow-up of pravastatin MAC adherence   I have seen this patient multiple times in the past   2024 patient reports hospitalization, transition to assisted living and sent time in rehab for ~ 3 weeks.  She continues on oxygen intermittently for her pneumonia at this time.   Pravastatin was not recognized as a current medication by patient.   Patient has transitioned her PCP to "[facility]" She states she is no longer a patient of [facility] after coming here for > 40 years.    Total time with patient call and documentation of interaction: 16 minutes.  F/U Phone call planned: None  Routing chart to FM admin to remove from assigned patient pool.

## 2023-07-27 NOTE — Telephone Encounter (Signed)
reviewed

## 2023-08-08 ENCOUNTER — Encounter: Payer: Self-pay | Admitting: Pharmacist

## 2023-08-08 ENCOUNTER — Ambulatory Visit (INDEPENDENT_AMBULATORY_CARE_PROVIDER_SITE_OTHER): Payer: Medicare HMO | Admitting: Podiatry

## 2023-08-08 DIAGNOSIS — M79674 Pain in right toe(s): Secondary | ICD-10-CM

## 2023-08-08 DIAGNOSIS — M79675 Pain in left toe(s): Secondary | ICD-10-CM | POA: Diagnosis not present

## 2023-08-08 DIAGNOSIS — B351 Tinea unguium: Secondary | ICD-10-CM

## 2023-08-08 NOTE — Progress Notes (Unsigned)
Subjective:  Patient ID: Rebecca Walter, female    DOB: 05/12/1941,  MRN: [id number]  Rebecca Walter presents to clinic today for: No chief complaint on file. . Patient notes nails are thick, discolored, elongated and painful in shoegear when trying to ambulate.  Patient uses a walker to assist with ambulation.  Past Medical History:  Diagnosis Date   Acute pain of both knees 04/04/2022   Anxiety    Arthritis    Basal cell carcinoma 03/02/2021   Candidiasis of skin 02/14/2020   CARCINOMA, BASAL CELL 05/26/2007   Qualifier: History of  By: Lula Olszewski MD, Rachel     Carpal tunnel syndrome of right wrist 11/26/2020   Cellulitis of left lower extremity 12/03/2022   Cervical spinal stenosis 05/11/2022   MRI Brain (05/11/22) shows moderate cervical spinal stenosis   Chronic diastolic CHF (congestive heart failure) (HCC)    Echocardiogram 01/2020: EF 70-75, no RWMA, Gr 1 DD, normal RVSF, RVSP 45.8, mild to mod LAE, trivial MR, mild PI   Coronavirus infection 02/16/2023   Diabetes mellitus    Diarrhea of presumed infectious origin 01/16/2021   DYSKINESIA, ESOPHAGUS 09/13/2007   Qualifier: Diagnosis of  By: Clelia Croft MD, Kimberlee     Early satiety 09/04/2020   Elevated TSH 11/21/2017   Epistaxis 02/04/2020   Generalized weakness 12/15/2018   GERD (gastroesophageal reflux disease)    Gout    History of melanoma 01/31/2021   History of nuclear stress test    Myoview 06/2020: EF 75, no ischemia or infarction, low risk   Hoarseness 11/27/2019   Hyperlipidemia    Hypertension    Hypertensive heart disease with CHF (congestive heart failure) (HCC)    Hyponatremia    Leg fatigue 01/03/2021   Mild aortic stenosis 06/2017   Mild cognitive impairment 09/03/2022   Mild mitral regurgitation 06/2017   Moderate pulmonary valve insufficiency 06/2017   OBESITY, NOS 02/16/2007   Qualifier: Diagnosis of  By: Haydee Salter     Osteopenia    Pneumonia of both lower lobes due to infectious  organism 02/11/2023   Sepsis due to Acinetobacter baumannii with acute hypoxic respiratory failure without septic shock (HCC) 02/16/2023   SOB (shortness of breath) 02/11/2023   Vertigo 08/29/2013    Allergies  Allergen Reactions   Baclofen Other (See Comments)    Caused leg weakness   Hydrochlorothiazide Other (See Comments)    Uric acid elevation and gout.      Liraglutide Other (See Comments)    Tongue Glossitus   Losartan Potassium     Other reaction(s): diarrhea   Lotensin [Benazepril] Other (See Comments)    Dry cough    Benazepril Hcl     Other reaction(s): dry cough   Metoprolol Succinate [Metoprolol]     Other reaction(s): coughing   Beta Adrenergic Blockers Other (See Comments)    Occurred with metoprolol  coughing    Review of Systems: Negative except as noted in the HPI.  Objective:  There were no vitals filed for this visit.  Rebecca Walter is a pleasant 82 y.o. female in NAD. AAO x 3.  Vascular Examination: Capillary refill time is 3-5 seconds to toes bilateral. Palpable pedal pulses b/l LE. Digital hair sparse b/l.  Skin temperature gradient WNL b/l. No varicosities b/l. No cyanosis noted b/l.  +1 pitting edema bilateral legs and ankles.  Dermatological Examination: Pedal skin with normal turgor, texture and tone b/l. No open wounds. No interdigital macerations b/l.  Toenails x10 are 3mm thick, discolored, dystrophic with subungual debris. There is pain with compression of the nail plates.  They are elongated x10     Latest Ref Rng & Units 02/15/2023    2:02 AM 01/06/2023   11:39 AM 09/30/2022    2:28 PM  Hemoglobin A1C  Hemoglobin-A1c 4.8 - 5.6 % 7.9  7.7  8.2    Assessment/Plan: 1. Pain due to onychomycosis of toenails of both feet     The mycotic toenails were sharply debrided x10 with sterile nail nippers and a power debriding burr to decrease bulk/thickness and length.    Patient notes she is already scheduled for next appointment with Dr.  Donzetta Matters in December.   Clerance Lav, DPM, FACFAS [facility]     [address] [place], [place] [address]                Office [phone]  Fax [phone]

## 2023-08-08 NOTE — Progress Notes (Signed)
Adherence check-in. Appears adherent to pravastatin on 08/02/23 for 90 day supply.  Not patient at [facility].

## 2023-08-31 ENCOUNTER — Other Ambulatory Visit: Payer: Self-pay | Admitting: Family Medicine

## 2023-09-08 ENCOUNTER — Other Ambulatory Visit: Payer: Self-pay | Admitting: Family Medicine

## 2023-09-08 DIAGNOSIS — G2581 Restless legs syndrome: Secondary | ICD-10-CM

## 2023-09-08 DIAGNOSIS — M5416 Radiculopathy, lumbar region: Secondary | ICD-10-CM

## 2023-09-16 ENCOUNTER — Other Ambulatory Visit: Payer: Self-pay

## 2023-09-22 ENCOUNTER — Other Ambulatory Visit: Payer: Self-pay | Admitting: Family Medicine

## 2023-09-22 DIAGNOSIS — G2581 Restless legs syndrome: Secondary | ICD-10-CM

## 2023-09-22 DIAGNOSIS — M5416 Radiculopathy, lumbar region: Secondary | ICD-10-CM

## 2023-10-24 ENCOUNTER — Other Ambulatory Visit: Payer: Self-pay | Admitting: Family Medicine

## 2023-10-24 DIAGNOSIS — Z794 Long term (current) use of insulin: Secondary | ICD-10-CM

## 2023-10-24 DIAGNOSIS — I10 Essential (primary) hypertension: Secondary | ICD-10-CM

## 2023-10-24 MED ORDER — INSULIN ASPART (W/NIACINAMIDE) 100 UNIT/ML ~~LOC~~ SOPN: PEN_INJECTOR | SUBCUTANEOUS | 0 refills | Status: DC

## 2023-11-08 ENCOUNTER — Ambulatory Visit: Payer: Medicare HMO | Admitting: Podiatry

## 2023-11-19 ENCOUNTER — Other Ambulatory Visit: Payer: Self-pay | Admitting: Family Medicine

## 2023-11-23 ENCOUNTER — Encounter: Payer: Self-pay | Admitting: Podiatry

## 2023-11-23 ENCOUNTER — Ambulatory Visit (INDEPENDENT_AMBULATORY_CARE_PROVIDER_SITE_OTHER): Payer: Medicare HMO | Admitting: Podiatry

## 2023-11-23 DIAGNOSIS — E1143 Type 2 diabetes mellitus with diabetic autonomic (poly)neuropathy: Secondary | ICD-10-CM | POA: Diagnosis not present

## 2023-11-23 DIAGNOSIS — M79674 Pain in right toe(s): Secondary | ICD-10-CM

## 2023-11-23 DIAGNOSIS — B351 Tinea unguium: Secondary | ICD-10-CM | POA: Diagnosis not present

## 2023-11-23 DIAGNOSIS — M79675 Pain in left toe(s): Secondary | ICD-10-CM | POA: Diagnosis not present

## 2023-11-23 DIAGNOSIS — Z794 Long term (current) use of insulin: Secondary | ICD-10-CM

## 2023-11-23 NOTE — Progress Notes (Signed)
This patient returns to my office for at risk foot care.  This patient requires this care by a professional since this patient will be at risk due to having diabetes.    This patient is unable to cut nails herself since the patient cannot reach her nails.These nails are painful walking and wearing shoes.  This patient presents for at risk foot care today.    General Appearance  Alert, conversant and in no acute stress.  Vascular  Dorsalis pedis and posterior tibial  pulses are palpable  bilaterally.  Capillary return is within normal limits  bilaterally. Temperature is within normal limits  bilaterally.  Neurologic  Senn-Weinstein monofilament wire test within normal limits  bilaterally. Muscle power within normal limits bilaterally.  Nails Thick disfigured discolored nails with subungual debris  from hallux to fifth toes bilaterally. No evidence of bacterial infection or drainage bilaterally.  Orthopedic  No limitations of motion  feet .  No crepitus or effusions noted.  No bony pathology or digital deformities noted.  HAV  B/L  Skin  normotropic skin with no porokeratosis noted bilaterally.  No signs of infections or ulcers noted.   Onychomycosis  Pain in right toes  Pain in left toes    Consent was obtained for treatment procedures.   Mechanical debridement of nails 1-5  bilaterally performed with a nail nipper.  Filed with dremel without incident.    Return office visit    3 months                 Told patient to return for periodic foot care and evaluation due to potential at risk complications.   Delbert Darley DPM  

## 2023-11-26 ENCOUNTER — Other Ambulatory Visit: Payer: Self-pay | Admitting: Student

## 2023-11-26 DIAGNOSIS — Z794 Long term (current) use of insulin: Secondary | ICD-10-CM

## 2023-11-26 DIAGNOSIS — E1143 Type 2 diabetes mellitus with diabetic autonomic (poly)neuropathy: Secondary | ICD-10-CM

## 2023-11-28 ENCOUNTER — Telehealth: Payer: Self-pay

## 2023-11-28 NOTE — Telephone Encounter (Signed)
Contacted the patient to schedule diabetes f/u however the patient stated that she has switched doctor offices and that she doesn't come to [facility] anymore.

## 2023-11-28 NOTE — Telephone Encounter (Signed)
-----   Message from [facility] sent at 11/28/2023  9:04 AM EST ----- Can you set up appt for patient to follow up diabetes

## 2023-12-03 ENCOUNTER — Other Ambulatory Visit: Payer: Self-pay | Admitting: Family Medicine

## 2023-12-03 DIAGNOSIS — F419 Anxiety disorder, unspecified: Secondary | ICD-10-CM

## 2023-12-09 ENCOUNTER — Other Ambulatory Visit: Payer: Self-pay | Admitting: Family Medicine

## 2023-12-19 ENCOUNTER — Other Ambulatory Visit (HOSPITAL_BASED_OUTPATIENT_CLINIC_OR_DEPARTMENT_OTHER): Payer: Self-pay | Admitting: Registered Nurse

## 2023-12-19 ENCOUNTER — Ambulatory Visit (HOSPITAL_BASED_OUTPATIENT_CLINIC_OR_DEPARTMENT_OTHER)
Admission: RE | Admit: 2023-12-19 | Discharge: 2023-12-19 | Disposition: A | Discharge status: Home / Self Care | Payer: Medicare HMO | Source: Ambulatory Visit | Attending: Registered Nurse | Admitting: Registered Nurse

## 2023-12-19 DIAGNOSIS — Z0000 Encounter for general adult medical examination without abnormal findings: Secondary | ICD-10-CM | POA: Insufficient documentation

## 2023-12-26 ENCOUNTER — Other Ambulatory Visit: Payer: Self-pay | Admitting: Family Medicine

## 2023-12-26 DIAGNOSIS — F419 Anxiety disorder, unspecified: Secondary | ICD-10-CM

## 2023-12-29 LAB — HM DIABETES EYE EXAM

## 2024-01-03 ENCOUNTER — Other Ambulatory Visit: Payer: Self-pay | Admitting: Family Medicine

## 2024-01-03 DIAGNOSIS — F419 Anxiety disorder, unspecified: Secondary | ICD-10-CM

## 2024-01-19 ENCOUNTER — Other Ambulatory Visit: Payer: Self-pay | Admitting: Family Medicine

## 2024-02-14 ENCOUNTER — Encounter: Payer: Self-pay | Admitting: Podiatry

## 2024-02-14 ENCOUNTER — Ambulatory Visit (INDEPENDENT_AMBULATORY_CARE_PROVIDER_SITE_OTHER): Payer: Medicare HMO | Admitting: Podiatry

## 2024-02-14 VITALS — Ht 60.0 in | Wt 193.4 lb

## 2024-02-14 DIAGNOSIS — M2041 Other hammer toe(s) (acquired), right foot: Secondary | ICD-10-CM

## 2024-02-14 DIAGNOSIS — M79674 Pain in right toe(s): Secondary | ICD-10-CM

## 2024-02-14 DIAGNOSIS — M2042 Other hammer toe(s) (acquired), left foot: Secondary | ICD-10-CM

## 2024-02-14 DIAGNOSIS — Z794 Long term (current) use of insulin: Secondary | ICD-10-CM

## 2024-02-14 DIAGNOSIS — E1143 Type 2 diabetes mellitus with diabetic autonomic (poly)neuropathy: Secondary | ICD-10-CM | POA: Diagnosis not present

## 2024-02-14 DIAGNOSIS — E119 Type 2 diabetes mellitus without complications: Secondary | ICD-10-CM

## 2024-02-14 DIAGNOSIS — M79675 Pain in left toe(s): Secondary | ICD-10-CM | POA: Diagnosis not present

## 2024-02-14 DIAGNOSIS — B351 Tinea unguium: Secondary | ICD-10-CM | POA: Diagnosis not present

## 2024-02-17 NOTE — Progress Notes (Signed)
 Subjective:  Patient ID: Rebecca Walter, female    DOB: August 30, 1941,  MRN: [id number]  83 y.o. female presents for annual diabetic foot examination, at risk foot care with history of diabetic neuropathy, and painful, elongated thickened toenails x 10 which are symptomatic when wearing enclosed shoe gear. This interferes with his/her daily activities.  Chief Complaint  Patient presents with   Nail Problem    Pt is here for [facility] last A1C was 7.5 PCP is Dr Laroy Apple and LOV was in [place].   New problem(s): None   PCP is Levin Erp, MD.  Allergies  Allergen Reactions   Baclofen Other (See Comments)    Caused leg weakness   Hydrochlorothiazide Other (See Comments)    Uric acid elevation and gout.      Liraglutide Other (See Comments)    Tongue Glossitus   Losartan Potassium     Other reaction(s): diarrhea   Lotensin [Benazepril] Other (See Comments)    Dry cough    Benazepril Hcl     Other reaction(s): dry cough   Metoprolol Succinate [Metoprolol]     Other reaction(s): coughing   Beta Adrenergic Blockers Other (See Comments)    Occurred with metoprolol  coughing    Review of Systems: Negative except as noted in the HPI.   Objective:  Rebecca Walter is a pleasant 83 y.o. female in NAD. AAO x 3.  Vascular Examination: Vascular status intact b/l with palpable pedal pulses. CFT immediate b/l. Pedal hair present. No edema. No pain with calf compression b/l. Skin temperature gradient WNL b/l. No varicosities noted. No cyanosis or clubbing noted.  Neurological Examination: Pt has subjective symptoms of neuropathy. Sensation grossly intact b/l with 10 gram monofilament. Vibratory sensation intact b/l.  Dermatological Examination: Pedal skin with normal turgor, texture and tone b/l. No open wounds nor interdigital macerations noted. Toenails 1-5 b/l thick, discolored, elongated with subungual debris and pain on dorsal palpation. No hyperkeratotic lesions noted b/l.    Musculoskeletal Examination: Muscle strength 5/5 to b/l LE.  No pain, crepitus noted b/l. Hammertoe(s) noted to the left second digit and right second digit. Utilizes rollator for ambulation assistance.  Radiographs: None  Last A1c:       No data to display           Assessment:   1. Pain due to onychomycosis of toenails of both feet   2. Acquired hammertoes of both feet   3. Type 2 diabetes mellitus with diabetic autonomic neuropathy, with long-term current use of insulin (HCC)   4. Encounter for diabetic foot exam (HCC)    Plan:  Diabetic foot examination performed today. All patient's and/or POA's questions/concerns addressed on today's visit. Toenails 1-5 debrided in length and girth without incident. Continue foot and shoe inspections daily. Monitor blood glucose per PCP/Endocrinologist's recommendations. Continue soft, supportive shoe gear daily. Report any pedal injuries to medical professional. Call office if there are any questions/concerns. -Patient/POA to call should there be question/concern in the interim.  Return in about 4 months (around 06/13/2024).  Freddie Breech, DPM      [facility] LOCATION: [address], [place] [address]  Office [phone]   [facility] LOCATION: [address] [place], [place] [address] Office [phone]

## 2024-02-18 ENCOUNTER — Other Ambulatory Visit: Payer: Self-pay | Admitting: Family Medicine

## 2024-02-18 DIAGNOSIS — I5032 Chronic diastolic (congestive) heart failure: Secondary | ICD-10-CM

## 2024-02-24 ENCOUNTER — Ambulatory Visit: Payer: Medicare HMO | Admitting: Podiatry

## 2024-03-06 ENCOUNTER — Other Ambulatory Visit: Payer: Self-pay | Admitting: Student

## 2024-03-06 DIAGNOSIS — I10 Essential (primary) hypertension: Secondary | ICD-10-CM

## 2024-03-07 ENCOUNTER — Other Ambulatory Visit: Payer: Self-pay | Admitting: Registered Nurse

## 2024-03-07 ENCOUNTER — Ambulatory Visit
Admission: RE | Admit: 2024-03-07 | Discharge: 2024-03-07 | Disposition: A | Discharge status: Home / Self Care | Source: Ambulatory Visit | Attending: Registered Nurse | Admitting: Registered Nurse

## 2024-03-07 DIAGNOSIS — M25512 Pain in left shoulder: Secondary | ICD-10-CM

## 2024-03-07 IMAGING — MR MR HEAD W/O CM
10 series · 48 of 48 positions shown · non-contrast
Comparison: Prior head CT examinations 04/08/2018 and earlier.
Brain MRI 09/02/2013

CLINICAL DATA: Provided history: Visual hallucination. Psychosis;
visual hallucinations in elderly patient.

EXAM:
MRI HEAD WITHOUT CONTRAST
TECHNIQUE: Multiplanar, multiecho pulse sequences of the brain and surrounding
structures were obtained without intravenous contrast.

[Series 2: T1 · sagittal · 5.0mm · 0.45mm/px · 2 of 23 slices shown (1 of 2)]
[im 1/23]
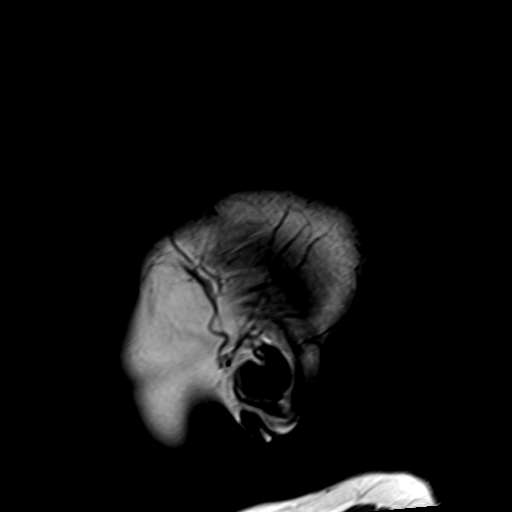
[im 23/23]
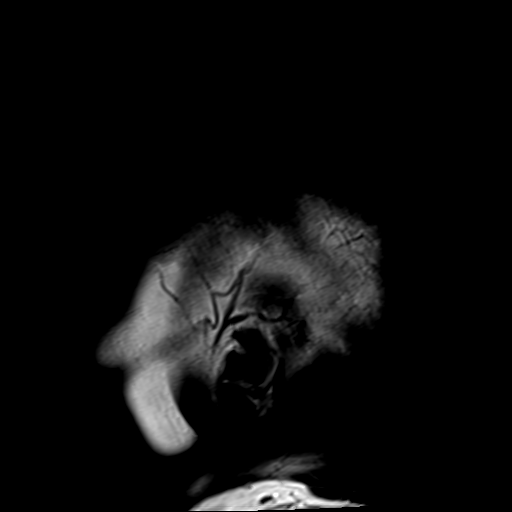

[Series 3: ax ep2d_diff_3 · axial · 3.0mm · 1.80mm/px · z∈[-35,+95]mm · 9 of 99 slices shown]
[im 1/99]
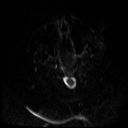
[im 13/99]
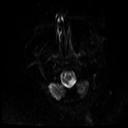
[im 25/99]
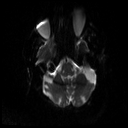
[im 37/99]
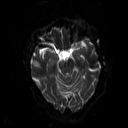
[im 50/99]
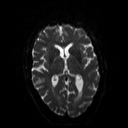
[im 62/99]
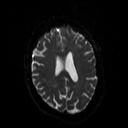
[im 74/99]
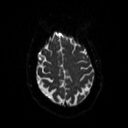
[im 86/99]
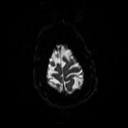
[im 99/99]
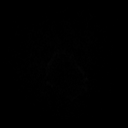

[Series 4: ax ep2d_diff_3_adc · axial · 3.0mm · 1.80mm/px · z∈[-35,+95]mm · 4 of 50 slices shown]
[im 1/50]
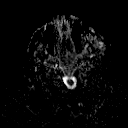
[im 17/50]
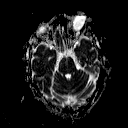
[im 33/50]
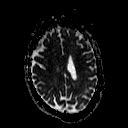
[im 50/50]
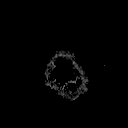

[Series 5: cor ep2d_diffusion · coronal · 5.0mm · 1.77mm/px · 5 of 54 slices shown]
[im 1/54]
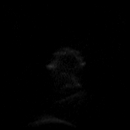
[im 14/54]
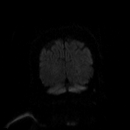
[im 27/54]
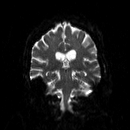
[im 40/54]
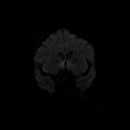
[im 54/54]
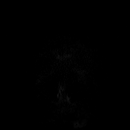

[Series 6: cor ep2d_diffusion_adc · coronal · 5.0mm · 1.77mm/px · 2 of 28 slices shown]
[im 1/28]
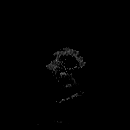
[im 28/28]
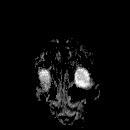

[Series 7: T2 · axial · 5.0mm · 0.51mm/px · z∈[-31,+91]mm · 2 of 24 slices shown (1 of 2)]
[im 1/24]
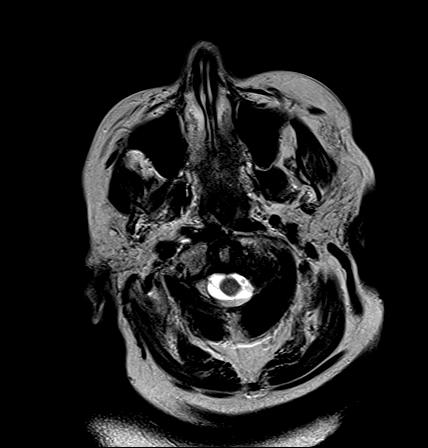
[im 24/24]
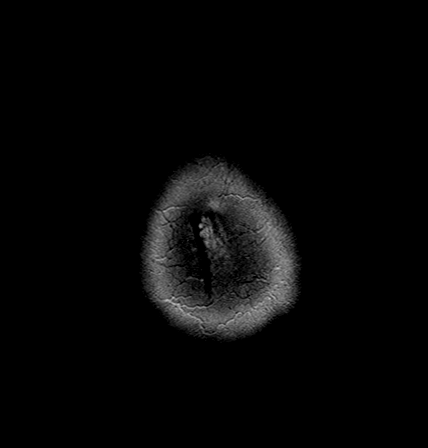

[Series 9: swi_images · axial · 3.0mm · 0.90mm/px · z∈[-38,+98]mm · 5 of 52 slices shown]
[im 1/52]
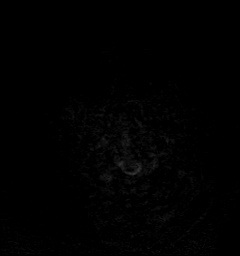
[im 13/52]
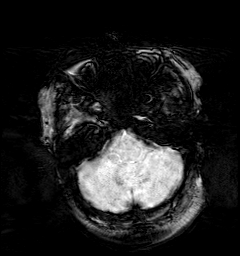
[im 26/52]
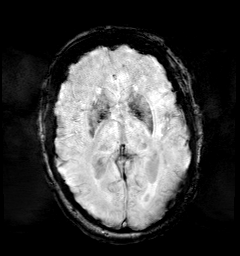
[im 39/52]
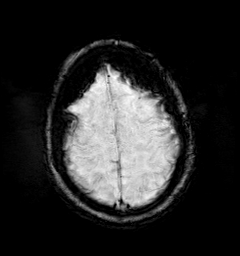
[im 52/52]
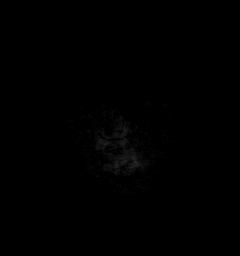

[Series 10: FLAIR · axial · 3.0mm · 0.43mm/px · z∈[-37,+93]mm · 3 of 38 slices shown]
[im 1/38]
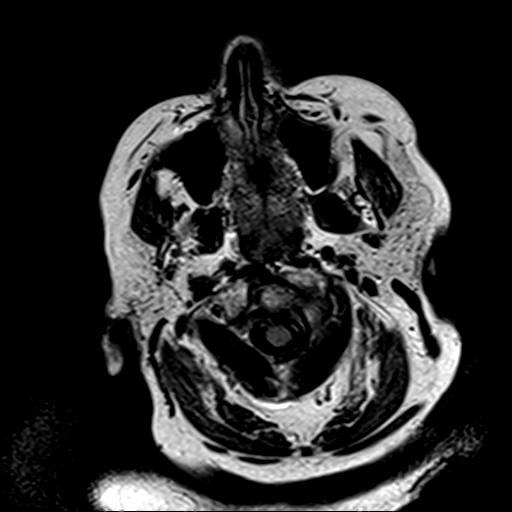
[im 19/38]
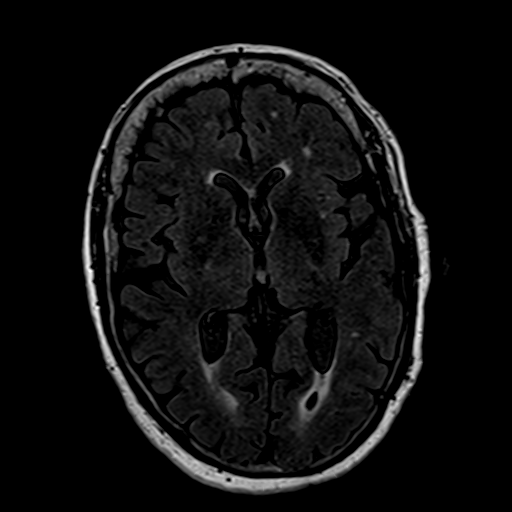
[im 38/38]
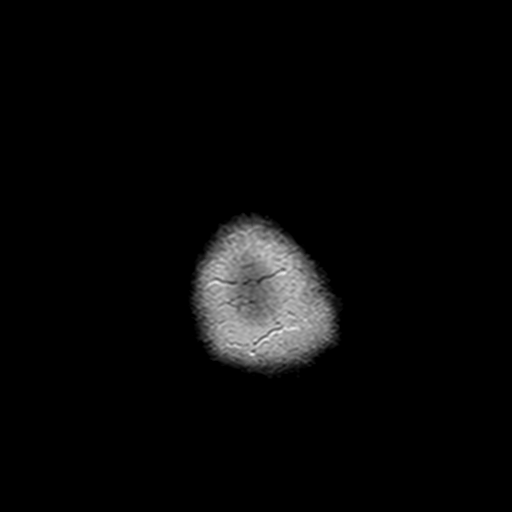

[Series 11: T1 · axial · 1.0mm · 0.72mm/px · z∈[-35,+92]mm · 13 of 144 slices shown (2 of 2)]
[im 1/144]
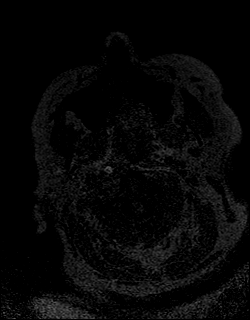
[im 12/144]
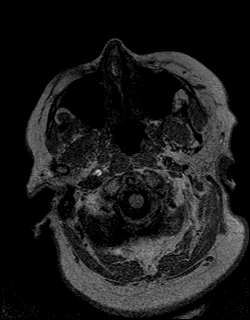
[im 24/144]
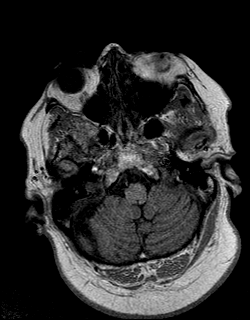
[im 36/144]
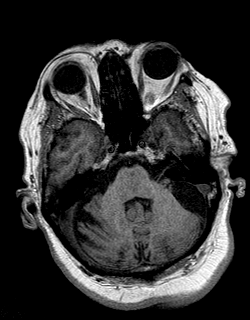
[im 48/144]
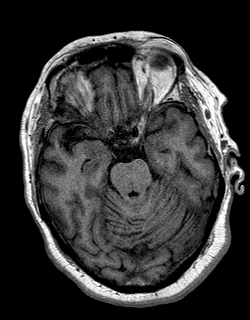
[im 60/144]
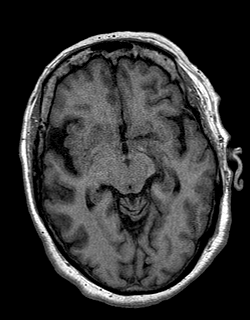
[im 72/144]
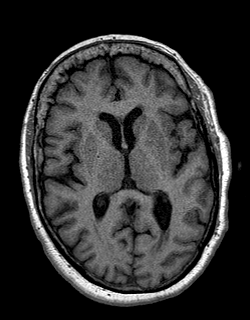
[im 84/144]
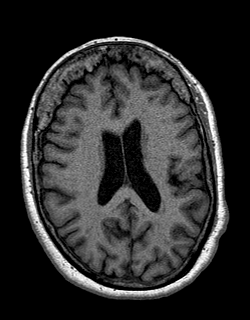
[im 96/144]
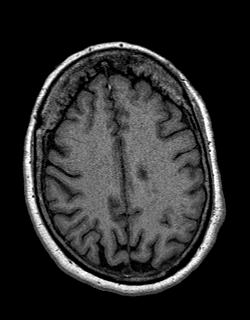
[im 108/144]
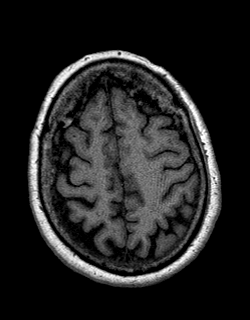
[im 120/144]
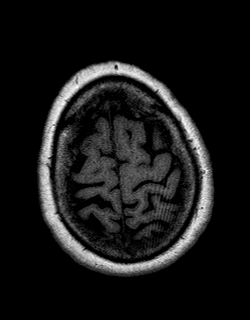
[im 132/144]
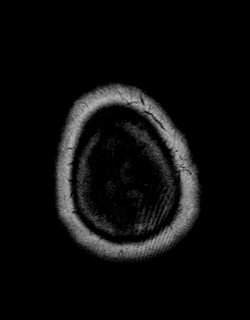
[im 144/144]
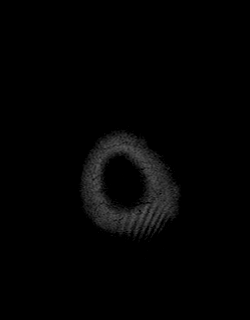

[Series 12: T2 · coronal · 5.0mm · 0.43mm/px · 3 of 30 slices shown (2 of 2)]
[im 1/30]
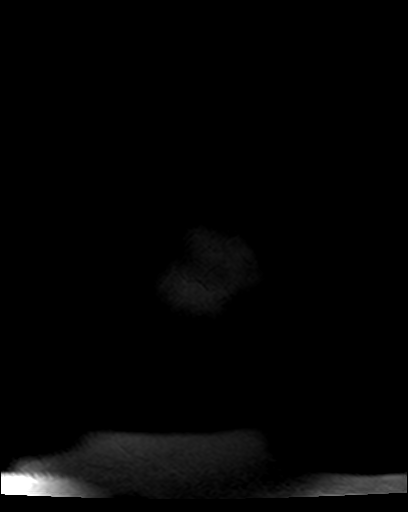
[im 15/30]
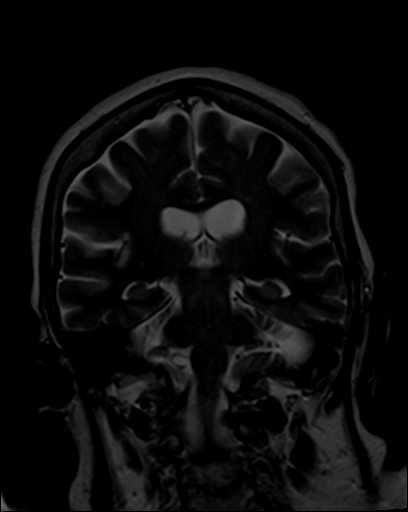
[im 30/30]
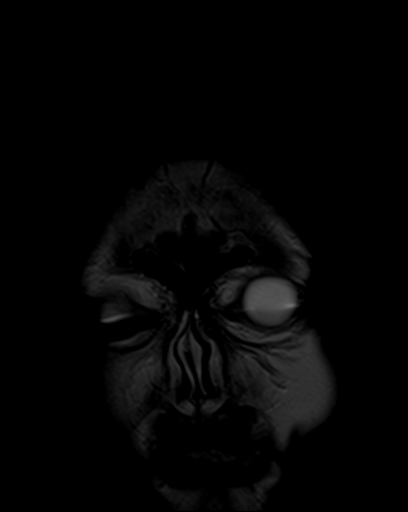

[48 of 48 positions shown; findings below may reference images not displayed]

FINDINGS: Brain:

Mild generalized cerebral atrophy.

Multifocal T2 FLAIR hyperintense signal abnormality within the
cerebral white matter, nonspecific but compatible with mild chronic
small vessel ischemic disease.

There is no acute infarct.

No evidence of an intracranial mass.

No chronic intracranial blood products.

No extra-axial fluid collection.

No midline shift.

Vascular: Maintained flow voids within the proximal large arterial
vessels.

Skull and upper cervical spine: No focal suspicious marrow lesion.
Incompletely assessed cervical spondylosis. Notably, a C3-C4
posterior disc osteophyte complex contributes to at least moderate
spinal canal stenosis.

Sinuses/Orbits: No mass or acute finding within the imaged orbits.
Prior bilateral ocular lens replacement. Trace mucosal thickening
within the bilateral ethmoid sinuses. Small mucous retention cyst
along the floor of the left maxillary sinus.
IMPRESSION: No evidence of acute intracranial abnormality.

Mild chronic small vessel ischemic changes within the cerebral white
matter, progressed from the prior MRI of 08/23/2013.

Mild generalized cerebral atrophy, also progressed.

Mild paranasal sinus disease, as described.

Incompletely assessed cervical spondylosis. Notably at C3-C4, a
posterior disc osteophyte complex contributes to at least moderate
spinal canal stenosis. Consider a cervical spine MRI for further
evaluation.

## 2024-03-09 ENCOUNTER — Other Ambulatory Visit: Payer: Self-pay | Admitting: Family Medicine

## 2024-03-14 NOTE — Telephone Encounter (Signed)
 Rx should be filled by PCP

## 2024-03-16 NOTE — Telephone Encounter (Signed)
 Spoke with patient, informed her that refills should come from her PCP

## 2024-04-12 ENCOUNTER — Other Ambulatory Visit: Payer: Self-pay

## 2024-04-12 DIAGNOSIS — Z794 Long term (current) use of insulin: Secondary | ICD-10-CM

## 2024-05-07 ENCOUNTER — Other Ambulatory Visit: Payer: Self-pay | Admitting: Student

## 2024-05-30 ENCOUNTER — Other Ambulatory Visit: Payer: Self-pay | Admitting: Student

## 2024-06-13 ENCOUNTER — Ambulatory Visit: Payer: Medicare HMO | Admitting: Podiatry

## 2024-06-13 ENCOUNTER — Encounter: Payer: Self-pay | Admitting: Podiatry

## 2024-06-13 DIAGNOSIS — B351 Tinea unguium: Secondary | ICD-10-CM

## 2024-06-13 DIAGNOSIS — M79674 Pain in right toe(s): Secondary | ICD-10-CM | POA: Diagnosis not present

## 2024-06-13 DIAGNOSIS — E1143 Type 2 diabetes mellitus with diabetic autonomic (poly)neuropathy: Secondary | ICD-10-CM | POA: Diagnosis not present

## 2024-06-13 DIAGNOSIS — E119 Type 2 diabetes mellitus without complications: Secondary | ICD-10-CM

## 2024-06-13 DIAGNOSIS — M79675 Pain in left toe(s): Secondary | ICD-10-CM | POA: Diagnosis not present

## 2024-06-13 DIAGNOSIS — L84 Corns and callosities: Secondary | ICD-10-CM | POA: Diagnosis not present

## 2024-06-13 DIAGNOSIS — M205X9 Other deformities of toe(s) (acquired), unspecified foot: Secondary | ICD-10-CM | POA: Diagnosis not present

## 2024-06-13 DIAGNOSIS — Z794 Long term (current) use of insulin: Secondary | ICD-10-CM

## 2024-06-14 NOTE — Progress Notes (Addendum)
 ANNUAL DIABETIC FOOT EXAM  Subjective: Rebecca Walter presents today for annual diabetic foot exam. Today, she complains of pain to L 5th toe. States it is very tender when walking with or without shoe gear.  Patient confirms h/o diabetes.  Patient has been diagnosed with neuropathy.  No primary care provider on file. is patient's PCP.  Past Medical History:  Diagnosis Date   Acute pain of both knees 04/04/2022   Anxiety    Arthritis    Basal cell carcinoma 03/02/2021   Candidiasis of skin 02/14/2020   CARCINOMA, BASAL CELL 05/26/2007   Qualifier: History of  By: Feliciana MD, Rachel     Carpal tunnel syndrome of right wrist 11/26/2020   Cellulitis of left lower extremity 12/03/2022   Cervical spinal stenosis 05/11/2022   MRI Brain (05/11/22) shows moderate cervical spinal stenosis   Chronic diastolic CHF (congestive heart failure) (HCC)    Echocardiogram 01/2020: EF 70-75, no RWMA, Gr 1 DD, normal RVSF, RVSP 45.8, mild to mod LAE, trivial MR, mild PI   Coronavirus infection 02/16/2023   Diabetes mellitus    Diarrhea of presumed infectious origin 01/16/2021   DYSKINESIA, ESOPHAGUS 09/13/2007   Qualifier: Diagnosis of  By: Loreli MD, Kimberlee     Early satiety 09/04/2020   Elevated TSH 11/21/2017   Epistaxis 02/04/2020   Generalized weakness 12/15/2018   GERD (gastroesophageal reflux disease)    Gout    History of melanoma 01/31/2021   History of nuclear stress test    Myoview  06/2020: EF 75, no ischemia or infarction, low risk   Hoarseness 11/27/2019   Hyperlipidemia    Hypertension    Hypertensive heart disease with CHF (congestive heart failure) (HCC)    Hyponatremia    Leg fatigue 01/03/2021   Mild aortic stenosis 06/2017   Mild cognitive impairment 09/03/2022   Mild mitral regurgitation 06/2017   Moderate pulmonary valve insufficiency 06/2017   OBESITY, NOS 02/16/2007   Qualifier: Diagnosis of  By: Damien Folks     Osteopenia    Pneumonia of both lower  lobes due to infectious organism 02/11/2023   Sepsis due to Acinetobacter baumannii with acute hypoxic respiratory failure without septic shock (HCC) 02/16/2023   SOB (shortness of breath) 02/11/2023   Vertigo 08/29/2013   Patient Active Problem List   Diagnosis Date Noted   HYPERTENSION, BENIGN SYSTEMIC 05/15/2023   Acute renal impairment 04/21/2023   Hyperkalemia 04/21/2023   Chronic anemia 04/21/2023   Leg swelling 04/01/2023   Urinary incontinence 04/01/2023   Acute hypoxemic respiratory failure (HCC) 02/10/2023   Healthcare maintenance 01/09/2023   Skin rash 12/07/2022   Mild cognitive impairment 09/03/2022   Mood disorder (HCC) 09/03/2022   BMI 39.0-39.9,adult 09/02/2022   Morbid obesity (HCC) 06/24/2022   Cerebral atrophy, mild (HCC) 06/24/2022   Cervical spinal stenosis 05/11/2022   Hypothyroidism 05/05/2022   Formed visual hallucinations 04/15/2022   CKD (chronic kidney disease) stage 3, GFR 30-59 ml/min (HCC) 01/13/2022   Esophageal dysphagia 03/27/2021   Need for shingles vaccine 01/31/2021   Abnormal MRI, spinal cord 01/03/2021   Bilateral leg numbness 12/31/2020   Laryngopharyngeal reflux (LPR) 06/19/2020   Frequent falls 04/17/2020   Restless leg syndrome 01/23/2020   Obstructive sleep apnea 12/04/2019   Diabetic autonomic neuropathy (HCC) 01/24/2019   Chronically dry eyes, right 12/15/2018   Hypertension associated with diabetes (HCC) 10/17/2018   HFpEF 70-75% 07/15/2017   Mild aortic stenosis 06/19/2017   Moderate pulmonary valve insufficiency 06/19/2017   Lumbar back  pain with radiculopathy affecting right lower extremity 10/29/2016   Allergic rhinitis 03/22/2011   INSOMNIA 09/16/2009   Gastroparesis 08/12/2009   Anxiety 10/18/2008   Diabetes mellitus, type II (HCC) 02/16/2007   Hyperlipidemia associated with type 2 diabetes mellitus (HCC) 02/16/2007   GASTROESOPHAGEAL REFLUX, NO ESOPHAGITIS 02/16/2007   Osteoarthritis of both knees 02/16/2007    Osteopenia after menopause 02/16/2007   Gout 02/16/2007   Past Surgical History:  Procedure Laterality Date   KIDNEY SURGERY     KNEE SURGERY     MENISECTOMY     OTHER SURGICAL HISTORY Left    Epiretinal Peel Dr Roz (Ophth)   WRIST SURGERY     Current Outpatient Medications on File Prior to Visit  Medication Sig Dispense Refill   acetaminophen  (TYLENOL ) 325 MG tablet Take 325 mg by mouth every 8 (eight) hours as needed (pain).     allopurinol  (ZYLOPRIM ) 100 MG tablet Take 1 tablet (100 mg total) by mouth daily. 90 tablet 3   amLODipine  (NORVASC ) 5 MG tablet TAKE 1 TABLET (5 MG TOTAL) BY MOUTH DAILY. 90 tablet 1   aspirin  81 MG chewable tablet Chew 1 tablet (81 mg total) by mouth daily. 30 tablet 5   baclofen  (LIORESAL ) 10 MG tablet TAKE 1 TABLET BY MOUTH TWICE A DAY AS NEEDED FOR MUSCLE SPASMS 180 tablet 0   busPIRone  (BUSPAR ) 15 MG tablet TAKE 1 TABLET BY MOUTH TWICE A DAY 180 tablet 1   furosemide  (LASIX ) 20 MG tablet TAKE 1 TABLET BY MOUTH DAILY AS NEEDED. TAKE ONE DOSE LASIX  IF YOU DEVELOP 3 POUND WEIGHT GAIN OR MORE, WORSENING LEG SWELLING, OR SHORTNESS OF BREATH 90 tablet 1   guaiFENesin  (MUCINEX ) 600 MG 12 hr tablet Take 2 tablets (1,200 mg total) by mouth 2 (two) times daily as needed.     Hydrocortisone , Perianal, 1 % CREA APPLY TO AFFECTED AREA TWICE A DAY 26 g 3   hydrOXYzine  (ATARAX ) 10 MG tablet Take 1 tablet (10 mg total) by mouth 3 (three) times daily as needed for anxiety. 30 tablet 0   insulin  aspart (FIASP  FLEXTOUCH) 100 UNIT/ML FlexTouch Pen INJECT 15 UNITS SUBCUTANEOUSLY DAILY WITH BREAKFAST, INJECT 9 UNITS WITH LUNCH AS NEEDED FOR CBG 180 OR MORE, AND INJECT 9 UNITS WITH SUPPER. NEEDS PCP APPOINTMENT FOR REPEAT A1C 3 mL 0   insulin  glargine (LANTUS  SOLOSTAR) 100 UNIT/ML Solostar Pen INJECT 64 UNITS INTO THE SKIN DAILY. 30 mL 11   Insulin  Pen Needle 32G X 6 MM MISC Use to dispense insulin  daily as instructed by physician. 100 each 3   levothyroxine  (SYNTHROID ) 50  MCG tablet Take 1 tablet (50 mcg total) by mouth every morning. 30 minutes before food 90 tablet 3   loperamide  (IMODIUM  A-D) 2 MG tablet Take 4 mg by mouth as needed for diarrhea or loose stools.     Multiple Vitamin (MULTI VITAMIN) TABS Take 1 tablet by mouth daily.     OneTouch Delica Lancets 33G MISC Please use to check blood sugar up to 4 times daily. E11.9 200 each 12   pantoprazole  (PROTONIX ) 40 MG tablet TAKE 1 TABLET BY MOUTH TWICE A DAY 180 tablet 0   polyethylene glycol powder (GLYCOLAX /MIRALAX ) 17 GM/SCOOP powder Take 17 g by mouth daily. (Patient taking differently: Take 17 g by mouth daily as needed for mild constipation or moderate constipation.) 3350 g 1   pravastatin  (PRAVACHOL ) 40 MG tablet TAKE 1 TABLET BY MOUTH EVERY DAY 90 tablet 2   sertraline  (ZOLOFT ) 100  MG tablet Take 1 tablet (100 mg total) by mouth daily. (Patient taking differently: Take 100 mg by mouth in the morning. Take along with 50 mg twice daily for a total daily dose of 200mg .) 90 tablet 4   sertraline  (ZOLOFT ) 50 MG tablet TAKE 1 TABLET IN THE AFTERNOON, ONE DOSE AT BEDTIME (Patient taking differently: Take 50 mg by mouth 2 (two) times daily. At lunch and at bedtime. Take along with 100 mg tablet for a total daily dose of 200 mg.) 180 tablet 4   spironolactone  (ALDACTONE ) 25 MG tablet TAKE ONE TABLET BY MOUTH 1 TIME A DAY 30 tablet 11   triamcinolone  ointment (KENALOG ) 0.5 % APPLY TO AFFECTED AREA TWICE A DAY 30 g 1   valsartan  (DIOVAN ) 320 MG tablet TAKE 1 TABLET BY MOUTH EVERY DAY (Patient taking differently: Take 320 mg by mouth daily.) 90 tablet 2   Vitamin E  400 units TABS Take 400 Units by mouth daily.     No current facility-administered medications on file prior to visit.    Allergies  Allergen Reactions   Baclofen  Other (See Comments)    Caused leg weakness   Hydrochlorothiazide  Other (See Comments)    Uric acid elevation and gout.      Liraglutide  Other (See Comments)    Tongue Glossitus    Losartan  Potassium     Other reaction(s): diarrhea   Lotensin  [Benazepril ] Other (See Comments)    Dry cough    Benazepril  Hcl     Other reaction(s): dry cough   Metoprolol Succinate [Metoprolol]     Other reaction(s): coughing   Beta Adrenergic Blockers Other (See Comments)    Occurred with metoprolol  coughing   Social History   Occupational History   Occupation: retired-office work  Tobacco Use   Smoking status: Never   Smokeless tobacco: Never  Vaping Use   Vaping status: Never Used  Substance and Sexual Activity   Alcohol  use: No   Drug use: No   Sexual activity: Not Currently   Family History  Problem Relation Age of Onset   Stroke Mother    Heart disease Father    Stroke Father    Hypertension Father    Cancer Sister    Cancer Brother    Heart disease Brother    Diabetes Maternal Uncle    Immunization History  Administered Date(s) Administered   Fluad Quad(high Dose 65+) 08/28/2021, 09/10/2022   Influenza Split 11/05/2011, 09/20/2012   Influenza Whole 11/15/2007, 10/24/2009, 09/25/2010   Influenza, High Dose Seasonal PF 09/07/2018   Influenza,inj,Quad PF,6+ Mos 10/08/2014, 10/10/2015, 09/26/2017, 12/03/2020   Influenza-Unspecified 10/19/2013, 10/03/2016, 10/26/2019   PFIZER Comirnaty(Gray Top)Covid-19 Tri-Sucrose Vaccine 06/09/2021   PFIZER(Purple Top)SARS-COV-2 Vaccination 02/22/2020, 03/25/2020, 10/09/2020   Pfizer Covid-19 Vaccine Bivalent Booster 4yrs & up 01/12/2022   Pfizer(Comirnaty)Fall Seasonal Vaccine 12 years and older 01/06/2023   Pneumococcal Conjugate-13 09/26/2017   Pneumococcal Polysaccharide-23 10/20/2004   Td 10/20/2002   Tdap 11/21/2017     Review of Systems: Negative except as noted in the HPI.   Objective: There were no vitals filed for this visit.  Rebecca Walter is a pleasant 83 y.o. female in NAD. AAO X 3.  Diabetic foot exam was performed with the following findings:   Vascular Examination: Capillary refill time  immediate b/l. Palpable pedal pulses. Pedal hair present b/l. No pain with calf compression b/l. Skin temperature gradient WNL b/l. No cyanosis or clubbing b/l. No ischemia or gangrene noted b/l. Dependent edema noted b/l LE.  Neurological Examination: Sensation grossly intact b/l with 10 gram monofilament. Vibratory sensation intact b/l. Pt has subjective symptoms of neuropathy.  Dermatological Examination: Pedal skin with normal turgor, texture and tone b/l.  No open wounds. No interdigital macerations.   Toenails 1-5 b/l thick, discolored, elongated with subungual debris and pain on dorsal palpation.   Hyperkeratotic lesion(s) distal lateral tip right 4th digit.  No erythema, no edema, no drainage, no fluctuance. Preulcerative lesion noted lateral DIPJ left 5th digit. There is visible subdermal hemorrhage. There is pain on palpation. There is no surrounding erythema, no edema, no drainage, no odor, no fluctuance.  Musculoskeletal Examination: Muscle strength 5/5 to all lower extremity muscle groups bilaterally. No pain, crepitus or joint limitation noted with ROM b/l LE. Adductovarus deformity bilateral 4th toes and bilateral 5th toes.. Patient ambulates with rollator assistance.  Radiographs: None     Lab Results  Component Value Date   HGBA1C 7.9 (H) 02/15/2023  ADA Risk Categorization: High Risk  Patient has one or more of the following: Loss of protective sensation Absent pedal pulses Severe Foot deformity History of foot ulcer  Assessment: 1. Pain due to onychomycosis of toenails of both feet   2. Pre-ulcerative corn or callous   3. Adductovarus rotation of toe, acquired, unspecified laterality   4. Type 2 diabetes mellitus with diabetic autonomic neuropathy, with long-term current use of insulin  (HCC)   5. Encounter for diabetic foot exam [facility])     Plan: -Patient was evaluated today. All questions/concerns addressed on today's visit. -Continue diabetic shoes  daily. -Toenails 1-5 b/l were debrided in length and girth with sterile nail nippers and dremel without iatrogenic bleeding.  -Corn(s) R 4th toe pared utilizing sterile scalpel blade without complication or incident. Total number debrided=1. -Preulcerative lesion pared L 5th toe utilizing sterile scalpel blade. Total number pared=1. Applied TAO and band-aid. Patient was instructed to apply Neosporin once daily for one week. Patient noted relief of pain symptoms post treatment today. -Patient/POA to call should there be question/concern in the interim. Return in about 3 months (around 09/13/2024).  Delon LITTIE Merlin, DPM      [place] LOCATION: [address] [place] [address]                   Office [phone]   [facility] LOCATION: [address] [place], [place] [address] Office [phone]

## 2024-09-26 ENCOUNTER — Encounter: Payer: Self-pay | Admitting: Podiatry

## 2024-09-26 ENCOUNTER — Ambulatory Visit: Admitting: Podiatry

## 2024-09-26 DIAGNOSIS — Z794 Long term (current) use of insulin: Secondary | ICD-10-CM

## 2024-09-26 DIAGNOSIS — E1143 Type 2 diabetes mellitus with diabetic autonomic (poly)neuropathy: Secondary | ICD-10-CM | POA: Diagnosis not present

## 2024-09-26 DIAGNOSIS — B351 Tinea unguium: Secondary | ICD-10-CM

## 2024-09-26 DIAGNOSIS — M79675 Pain in left toe(s): Secondary | ICD-10-CM | POA: Diagnosis not present

## 2024-09-26 DIAGNOSIS — M79674 Pain in right toe(s): Secondary | ICD-10-CM | POA: Diagnosis not present

## 2024-09-27 ENCOUNTER — Encounter (HOSPITAL_BASED_OUTPATIENT_CLINIC_OR_DEPARTMENT_OTHER): Attending: Internal Medicine | Admitting: Internal Medicine

## 2024-09-27 DIAGNOSIS — Z794 Long term (current) use of insulin: Secondary | ICD-10-CM | POA: Diagnosis not present

## 2024-09-27 DIAGNOSIS — L89323 Pressure ulcer of left buttock, stage 3: Secondary | ICD-10-CM | POA: Diagnosis present

## 2024-09-27 DIAGNOSIS — E1165 Type 2 diabetes mellitus with hyperglycemia: Secondary | ICD-10-CM | POA: Insufficient documentation

## 2024-09-27 DIAGNOSIS — E11622 Type 2 diabetes mellitus with other skin ulcer: Secondary | ICD-10-CM | POA: Insufficient documentation

## 2024-10-04 ENCOUNTER — Encounter: Payer: Self-pay | Admitting: Podiatry

## 2024-10-04 NOTE — Progress Notes (Signed)
 Subjective:  Patient ID: Rebecca Walter, female    DOB: [birth year],  MRN: [id number]  Rebecca Walter presents to clinic today for at risk foot care with history of diabetic neuropathy and painful, discolored, thick toenails which interfere with daily activities. She is accompanied by her son on today's visit. [facility] is going to [facility] for wound of buttock. She is seeing Dr. Rosan. Chief Complaint  Patient presents with   Diabetes    DFC IDDM A1C 7.9. LOV with PCP?   New problem(s): None.   PCP is Nguyen, Kim, NP.  Allergies  Allergen Reactions   Baclofen  Other (See Comments)    Caused leg weakness   Hydrochlorothiazide  Other (See Comments)    Uric acid elevation and gout.      Liraglutide  Other (See Comments)    Tongue Glossitus   Losartan  Potassium     Other reaction(s): diarrhea   Lotensin  [Benazepril ] Other (See Comments)    Dry cough    Benazepril  Hcl     Other reaction(s): dry cough   Metoprolol Succinate [Metoprolol]     Other reaction(s): coughing   Beta Adrenergic Blockers Other (See Comments)    Occurred with metoprolol  coughing    Review of Systems: Negative except as noted in the HPI.  Objective:  There were no vitals filed for this visit. Rebecca Walter is a pleasant 83 y.o. female obese in NAD. AAO x 3.  Vascular Examination: Capillary refill time immediate b/l. Palpable pedal pulses. Pedal hair present b/l. No pain with calf compression b/l. Skin temperature gradient WNL b/l. No cyanosis or clubbing b/l. No ischemia or gangrene noted b/l. Trace edema noted b/l LE.  Neurological Examination: Sensation grossly intact b/l with 10 gram monofilament. Vibratory sensation intact b/l. Pt has subjective symptoms of neuropathy.  Dermatological Examination: Pedal skin with normal turgor, texture and tone b/l.  No open wounds. No interdigital macerations.   Toenails 1-5 b/l thick, discolored, elongated with subungual debris and pain on dorsal  palpation.   Resolved hyperkeratotic lesion(s) distal lateral tip right 4th digit.  No erythema, no edema, no drainage, no fluctuance. Minimal HKT noted lateral DIPJ right 5th digit.  There is no surrounding erythema, no edema, no drainage, no odor, no fluctuance.  Musculoskeletal Examination: Muscle strength 5/5 to all lower extremity muscle groups bilaterally. No pain, crepitus or joint limitation noted with ROM b/l LE. Adductovarus deformity bilateral 4th toes and bilateral 5th toes.. Patient ambulates with rollator assistance.  Radiographs: None  Assessment/Plan: 1. Pain due to onychomycosis of toenails of both feet   2. Type 2 diabetes mellitus with diabetic autonomic neuropathy, with long-term current use of insulin  (HCC)     Consent given for treatment. Patient examined. All patient's and/or POA's questions/concerns addressed on today's visit. Mycotic toenails 1-5 debrided in length and girth without incident. Continue foot and shoe inspections daily. Monitor blood glucose per PCP/Endocrinologist's recommendations.Continue soft, supportive shoe gear daily. Report any pedal injuries to medical professional. Call office if there are any quesitons/concerns. -Patient/POA to call should there be question/concern in the interim.   Return in about 3 months (around 12/27/2024).  Delon LITTIE Merlin, DPM      [place] LOCATION: [address]  [place], [place] [address]                   Office [phone]   [facility] LOCATION: [address] [place], [place] [address] Office [phone]

## 2024-10-06 ENCOUNTER — Other Ambulatory Visit: Payer: Self-pay | Admitting: Family Medicine

## 2024-10-11 ENCOUNTER — Encounter (HOSPITAL_BASED_OUTPATIENT_CLINIC_OR_DEPARTMENT_OTHER): Admitting: Internal Medicine

## 2024-10-11 DIAGNOSIS — L89323 Pressure ulcer of left buttock, stage 3: Secondary | ICD-10-CM

## 2024-10-11 DIAGNOSIS — E11622 Type 2 diabetes mellitus with other skin ulcer: Secondary | ICD-10-CM | POA: Diagnosis not present

## 2025-01-08 ENCOUNTER — Observation Stay (HOSPITAL_COMMUNITY)

## 2025-01-08 ENCOUNTER — Emergency Department (HOSPITAL_COMMUNITY)

## 2025-01-08 ENCOUNTER — Observation Stay (HOSPITAL_COMMUNITY)
Admission: EM | Admit: 2025-01-08 | Discharge: 2025-01-18 | Disposition: A | Discharge status: Skilled Nursing Facility | Attending: Internal Medicine | Admitting: Internal Medicine

## 2025-01-08 ENCOUNTER — Encounter (HOSPITAL_COMMUNITY): Payer: Self-pay | Admitting: Emergency Medicine

## 2025-01-08 ENCOUNTER — Other Ambulatory Visit: Payer: Self-pay

## 2025-01-08 DIAGNOSIS — E1169 Type 2 diabetes mellitus with other specified complication: Secondary | ICD-10-CM | POA: Diagnosis not present

## 2025-01-08 DIAGNOSIS — E039 Hypothyroidism, unspecified: Secondary | ICD-10-CM | POA: Diagnosis present

## 2025-01-08 DIAGNOSIS — Z85828 Personal history of other malignant neoplasm of skin: Secondary | ICD-10-CM | POA: Insufficient documentation

## 2025-01-08 DIAGNOSIS — Z6833 Body mass index (BMI) 33.0-33.9, adult: Secondary | ICD-10-CM | POA: Insufficient documentation

## 2025-01-08 DIAGNOSIS — Z794 Long term (current) use of insulin: Secondary | ICD-10-CM | POA: Insufficient documentation

## 2025-01-08 DIAGNOSIS — I152 Hypertension secondary to endocrine disorders: Secondary | ICD-10-CM | POA: Diagnosis not present

## 2025-01-08 DIAGNOSIS — E785 Hyperlipidemia, unspecified: Secondary | ICD-10-CM | POA: Diagnosis not present

## 2025-01-08 DIAGNOSIS — Z8616 Personal history of COVID-19: Secondary | ICD-10-CM | POA: Diagnosis not present

## 2025-01-08 DIAGNOSIS — S22089A Unspecified fracture of T11-T12 vertebra, initial encounter for closed fracture: Secondary | ICD-10-CM | POA: Diagnosis present

## 2025-01-08 DIAGNOSIS — N390 Urinary tract infection, site not specified: Principal | ICD-10-CM

## 2025-01-08 DIAGNOSIS — E1159 Type 2 diabetes mellitus with other circulatory complications: Secondary | ICD-10-CM | POA: Diagnosis not present

## 2025-01-08 DIAGNOSIS — G4733 Obstructive sleep apnea (adult) (pediatric): Secondary | ICD-10-CM | POA: Diagnosis present

## 2025-01-08 DIAGNOSIS — B9620 Unspecified Escherichia coli [E. coli] as the cause of diseases classified elsewhere: Secondary | ICD-10-CM | POA: Insufficient documentation

## 2025-01-08 DIAGNOSIS — E1122 Type 2 diabetes mellitus with diabetic chronic kidney disease: Secondary | ICD-10-CM | POA: Insufficient documentation

## 2025-01-08 DIAGNOSIS — W19XXXA Unspecified fall, initial encounter: Secondary | ICD-10-CM | POA: Diagnosis not present

## 2025-01-08 DIAGNOSIS — N1831 Chronic kidney disease, stage 3a: Secondary | ICD-10-CM | POA: Diagnosis not present

## 2025-01-08 DIAGNOSIS — E1143 Type 2 diabetes mellitus with diabetic autonomic (poly)neuropathy: Secondary | ICD-10-CM

## 2025-01-08 DIAGNOSIS — E66811 Obesity, class 1: Secondary | ICD-10-CM | POA: Insufficient documentation

## 2025-01-08 DIAGNOSIS — I5032 Chronic diastolic (congestive) heart failure: Secondary | ICD-10-CM | POA: Diagnosis not present

## 2025-01-08 DIAGNOSIS — Z7982 Long term (current) use of aspirin: Secondary | ICD-10-CM | POA: Insufficient documentation

## 2025-01-08 DIAGNOSIS — M25551 Pain in right hip: Principal | ICD-10-CM

## 2025-01-08 DIAGNOSIS — N183 Chronic kidney disease, stage 3 unspecified: Secondary | ICD-10-CM | POA: Diagnosis present

## 2025-01-08 DIAGNOSIS — E119 Type 2 diabetes mellitus without complications: Secondary | ICD-10-CM

## 2025-01-08 DIAGNOSIS — Z79899 Other long term (current) drug therapy: Secondary | ICD-10-CM | POA: Insufficient documentation

## 2025-01-08 DIAGNOSIS — I130 Hypertensive heart and chronic kidney disease with heart failure and stage 1 through stage 4 chronic kidney disease, or unspecified chronic kidney disease: Secondary | ICD-10-CM | POA: Insufficient documentation

## 2025-01-08 DIAGNOSIS — M533 Sacrococcygeal disorders, not elsewhere classified: Secondary | ICD-10-CM | POA: Diagnosis present

## 2025-01-08 DIAGNOSIS — M898X8 Other specified disorders of bone, other site: Secondary | ICD-10-CM | POA: Diagnosis present

## 2025-01-08 DIAGNOSIS — Z6839 Body mass index (BMI) 39.0-39.9, adult: Secondary | ICD-10-CM

## 2025-01-08 LAB — I-STAT CHEM 8, ED
BUN: 40 mg/dL — ABNORMAL HIGH (ref 8–23)
Calcium, Ion: 1.24 mmol/L (ref 1.15–1.40)
Chloride: 107 mmol/L (ref 98–111)
Creatinine, Ser: 1.2 mg/dL — ABNORMAL HIGH (ref 0.44–1.00)
Glucose, Bld: 169 mg/dL — ABNORMAL HIGH (ref 70–99)
HCT: 36 % (ref 36.0–46.0)
Hemoglobin: 12.2 g/dL (ref 12.0–15.0)
Potassium: 4.2 mmol/L (ref 3.5–5.1)
Sodium: 139 mmol/L (ref 135–145)
TCO2: 19 mmol/L — ABNORMAL LOW (ref 22–32)

## 2025-01-08 LAB — COMPREHENSIVE METABOLIC PANEL WITH GFR
ALT: 14 U/L (ref 0–44)
AST: 20 U/L (ref 15–41)
Albumin: 3.9 g/dL (ref 3.5–5.0)
Alkaline Phosphatase: 77 U/L (ref 38–126)
Anion gap: 11 (ref 5–15)
BUN: 36 mg/dL — ABNORMAL HIGH (ref 8–23)
CO2: 20 mmol/L — ABNORMAL LOW (ref 22–32)
Calcium: 9.6 mg/dL (ref 8.9–10.3)
Chloride: 107 mmol/L (ref 98–111)
Creatinine, Ser: 1.09 mg/dL — ABNORMAL HIGH (ref 0.44–1.00)
GFR, Estimated: 50 mL/min — ABNORMAL LOW
Glucose, Bld: 175 mg/dL — ABNORMAL HIGH (ref 70–99)
Potassium: 4.3 mmol/L (ref 3.5–5.1)
Sodium: 138 mmol/L (ref 135–145)
Total Bilirubin: 0.3 mg/dL (ref 0.0–1.2)
Total Protein: 7.2 g/dL (ref 6.5–8.1)

## 2025-01-08 LAB — CBC WITH DIFFERENTIAL/PLATELET
Abs Immature Granulocytes: 0.12 K/uL — ABNORMAL HIGH (ref 0.00–0.07)
Basophils Absolute: 0.1 K/uL (ref 0.0–0.1)
Basophils Relative: 0 %
Eosinophils Absolute: 0.4 K/uL (ref 0.0–0.5)
Eosinophils Relative: 3 %
HCT: 35.6 % — ABNORMAL LOW (ref 36.0–46.0)
Hemoglobin: 12 g/dL (ref 12.0–15.0)
Immature Granulocytes: 1 %
Lymphocytes Relative: 14 %
Lymphs Abs: 1.7 K/uL (ref 0.7–4.0)
MCH: 30.3 pg (ref 26.0–34.0)
MCHC: 33.7 g/dL (ref 30.0–36.0)
MCV: 89.9 fL (ref 80.0–100.0)
Monocytes Absolute: 1.3 K/uL — ABNORMAL HIGH (ref 0.1–1.0)
Monocytes Relative: 10 %
Neutro Abs: 9.1 K/uL — ABNORMAL HIGH (ref 1.7–7.7)
Neutrophils Relative %: 72 %
Platelets: 217 K/uL (ref 150–400)
RBC: 3.96 MIL/uL (ref 3.87–5.11)
RDW: 13.2 % (ref 11.5–15.5)
WBC: 12.7 K/uL — ABNORMAL HIGH (ref 4.0–10.5)
nRBC: 0 % (ref 0.0–0.2)

## 2025-01-08 LAB — URINALYSIS, ROUTINE W REFLEX MICROSCOPIC
Bilirubin Urine: NEGATIVE
Glucose, UA: >=500 mg/dL — AB
Hgb urine dipstick: NEGATIVE
Ketones, ur: NEGATIVE mg/dL
Nitrite: NEGATIVE
Protein, ur: 30 mg/dL — AB
Specific Gravity, Urine: 1.02 (ref 1.005–1.030)
WBC, UA: >50 WBC/hpf (ref 0–5)
pH: 5 (ref 5.0–8.0)

## 2025-01-08 LAB — GLUCOSE, CAPILLARY: Glucose-Capillary: 129 mg/dL — ABNORMAL HIGH (ref 70–99)

## 2025-01-08 LAB — MAGNESIUM: Magnesium: 2.1 mg/dL (ref 1.7–2.4)

## 2025-01-08 LAB — CBG MONITORING, ED: Glucose-Capillary: 145 mg/dL — ABNORMAL HIGH (ref 70–99)

## 2025-01-08 LAB — PHOSPHORUS: Phosphorus: 4 mg/dL (ref 2.5–4.6)

## 2025-01-08 MED ORDER — SODIUM CHLORIDE 0.9 % IV SOLN: INTRAVENOUS | Status: AC

## 2025-01-08 MED ORDER — SERTRALINE HCL 50 MG PO TABS: 100.0000 mg | ORAL_TABLET | Freq: Every morning | ORAL | Status: DC

## 2025-01-08 MED ORDER — ACETAMINOPHEN 325 MG PO TABS: 650.0000 mg | ORAL_TABLET | Freq: Four times a day (QID) | ORAL | Status: DC | PRN

## 2025-01-08 MED ORDER — ONDANSETRON HCL 4 MG/2ML IJ SOLN
4.0000 mg | Freq: Four times a day (QID) | INTRAMUSCULAR | Status: DC | PRN
  Administered 2025-01-09: 4 mg via INTRAVENOUS
  Filled 2025-01-08: qty 2

## 2025-01-08 MED ORDER — SERTRALINE HCL 50 MG PO TABS
50.0000 mg | ORAL_TABLET | Freq: Two times a day (BID) | ORAL | Status: DC
  Administered 2025-01-08: 50 mg via ORAL
  Filled 2025-01-08: qty 1

## 2025-01-08 MED ORDER — FENTANYL CITRATE (PF) 50 MCG/ML IJ SOSY
50.0000 ug | PREFILLED_SYRINGE | Freq: Once | INTRAMUSCULAR | Status: AC
  Administered 2025-01-08: 50 ug via INTRAVENOUS
  Filled 2025-01-08: qty 1

## 2025-01-08 MED ORDER — INSULIN GLARGINE-YFGN 100 UNIT/ML ~~LOC~~ SOLN
30.0000 [IU] | Freq: Every day | SUBCUTANEOUS | Status: DC
  Administered 2025-01-09 – 2025-01-17 (×10): 30 [IU] via SUBCUTANEOUS
  Filled 2025-01-08 (×11): qty 0.3

## 2025-01-08 MED ORDER — ACETAMINOPHEN 650 MG RE SUPP: 650.0000 mg | Freq: Four times a day (QID) | RECTAL | Status: DC | PRN

## 2025-01-08 MED ORDER — PRAVASTATIN SODIUM 20 MG PO TABS: 40.0000 mg | ORAL_TABLET | Freq: Every day | ORAL | Status: DC

## 2025-01-08 MED ORDER — ONDANSETRON HCL 4 MG PO TABS: 4.0000 mg | ORAL_TABLET | Freq: Four times a day (QID) | ORAL | Status: DC | PRN

## 2025-01-08 MED ORDER — AMLODIPINE BESYLATE 5 MG PO TABS: 5.0000 mg | ORAL_TABLET | Freq: Every day | ORAL | Status: DC

## 2025-01-08 MED ORDER — PANTOPRAZOLE SODIUM 40 MG PO TBEC
40.0000 mg | DELAYED_RELEASE_TABLET | Freq: Two times a day (BID) | ORAL | Status: DC
  Administered 2025-01-08 – 2025-01-18 (×20): 40 mg via ORAL
  Filled 2025-01-08 (×20): qty 1

## 2025-01-08 MED ORDER — HYDROCODONE-ACETAMINOPHEN 5-325 MG PO TABS
1.0000 | ORAL_TABLET | ORAL | Status: DC | PRN
  Administered 2025-01-08 – 2025-01-15 (×8): 2 via ORAL
  Administered 2025-01-16 (×2): 1 via ORAL
  Filled 2025-01-08: qty 1
  Filled 2025-01-08 (×8): qty 2
  Filled 2025-01-08: qty 1

## 2025-01-08 MED ORDER — PRAVASTATIN SODIUM 40 MG PO TABS
40.0000 mg | ORAL_TABLET | Freq: Every day | ORAL | Status: DC
  Administered 2025-01-09 – 2025-01-18 (×10): 40 mg via ORAL
  Filled 2025-01-08 (×10): qty 1

## 2025-01-08 MED ORDER — INSULIN ASPART 100 UNIT/ML IJ SOLN
0.0000 [IU] | INTRAMUSCULAR | Status: DC
  Administered 2025-01-08: 1 [IU] via SUBCUTANEOUS
  Administered 2025-01-09: 2 [IU] via SUBCUTANEOUS
  Administered 2025-01-09 (×2): 1 [IU] via SUBCUTANEOUS
  Administered 2025-01-09: 2 [IU] via SUBCUTANEOUS
  Administered 2025-01-10 (×2): 1 [IU] via SUBCUTANEOUS
  Administered 2025-01-10 (×2): 2 [IU] via SUBCUTANEOUS
  Administered 2025-01-10: 1 [IU] via SUBCUTANEOUS
  Administered 2025-01-10: 2 [IU] via SUBCUTANEOUS
  Administered 2025-01-11 (×2): 1 [IU] via SUBCUTANEOUS
  Administered 2025-01-11 (×3): 2 [IU] via SUBCUTANEOUS
  Administered 2025-01-12: 1 [IU] via SUBCUTANEOUS
  Administered 2025-01-12: 2 [IU] via SUBCUTANEOUS
  Administered 2025-01-12 (×3): 1 [IU] via SUBCUTANEOUS
  Administered 2025-01-13: 2 [IU] via SUBCUTANEOUS
  Administered 2025-01-13 (×2): 1 [IU] via SUBCUTANEOUS
  Administered 2025-01-13 – 2025-01-14 (×2): 2 [IU] via SUBCUTANEOUS
  Filled 2025-01-08 (×2): qty 1
  Filled 2025-01-08 (×6): qty 2
  Filled 2025-01-08: qty 1
  Filled 2025-01-08: qty 2
  Filled 2025-01-08 (×3): qty 1
  Filled 2025-01-08 (×6): qty 2
  Filled 2025-01-08: qty 1
  Filled 2025-01-08: qty 2
  Filled 2025-01-08 (×2): qty 1

## 2025-01-08 MED ORDER — SERTRALINE HCL 50 MG PO TABS
50.0000 mg | ORAL_TABLET | Freq: Every day | ORAL | Status: DC
  Administered 2025-01-09 – 2025-01-18 (×10): 50 mg via ORAL
  Filled 2025-01-08 (×10): qty 1

## 2025-01-08 MED ORDER — AMLODIPINE BESYLATE 10 MG PO TABS
10.0000 mg | ORAL_TABLET | Freq: Every day | ORAL | Status: DC
  Administered 2025-01-09 – 2025-01-18 (×10): 10 mg via ORAL
  Filled 2025-01-08 (×10): qty 1

## 2025-01-08 MED ORDER — BUSPIRONE HCL 5 MG PO TABS
15.0000 mg | ORAL_TABLET | Freq: Two times a day (BID) | ORAL | Status: DC
  Administered 2025-01-08 – 2025-01-18 (×20): 15 mg via ORAL
  Filled 2025-01-08 (×14): qty 1
  Filled 2025-01-08: qty 2
  Filled 2025-01-08 (×5): qty 1

## 2025-01-08 MED ORDER — IOHEXOL 300 MG/ML  SOLN
100.0000 mL | Freq: Once | INTRAMUSCULAR | Status: AC | PRN
  Administered 2025-01-08: 100 mL via INTRAVENOUS

## 2025-01-08 MED ORDER — HYDROMORPHONE HCL 1 MG/ML IJ SOLN
0.5000 mg | INTRAMUSCULAR | Status: DC | PRN
  Administered 2025-01-09 (×3): 1 mg via INTRAVENOUS
  Filled 2025-01-08 (×3): qty 1

## 2025-01-08 NOTE — Assessment & Plan Note (Signed)
-   Check TSH continue home medications Synthroid at 50 mcg po q day

## 2025-01-08 NOTE — Assessment & Plan Note (Signed)
 After a fall on the right, significant pain  CT pelvis ordered

## 2025-01-08 NOTE — Assessment & Plan Note (Signed)
Continue Pravachol 40 mg po q day

## 2025-01-08 NOTE — ED Triage Notes (Signed)
 Patient BIB EMS from home with complaint of mechanical fall. Per EMS report, patient accidentally pressed the gas instead of the brake while operating her mobility scooter inside the home, causing her to fall forward and land on her right hip. Patient denies she hit her head, denies LOC, not taking blood thinners. Patient denies LOC. Total of 200mcg given by EMS. 10/10 Right hip pain.

## 2025-01-08 NOTE — ED Provider Notes (Signed)
" [place] [facility] AT [facility] Provider Note   CSN: [id number] Arrival date & time: 01/08/25  1544     Patient presents with: Fall   Rebecca Walter is a 84 y.o. female with past medical history of diabetes, hypertension, hyperlipidemia, GERD, osteoarthritis, who presents emergency department for evaluation of a fall today.  Patient states that she accidentally pressed the gas instead of the brake while offloading her mobility scooter inside her home, which caused her to fall forward and lean on her right side.  Today she is reporting right hip as well as right rib pain.  She denies hitting her head or LOC.  She is not on any blood thinners.  Patient given pain medicine by EMS, and she states it is helping.   Fall       Prior to Admission medications  Medication Sig Start Date End Date Taking? Authorizing Provider  acetaminophen  (TYLENOL ) 325 MG tablet Take 325 mg by mouth every 8 (eight) hours as needed (pain).    [provider]  allopurinol  (ZYLOPRIM ) 100 MG tablet Take 1 tablet (100 mg total) by mouth daily. 03/10/23   Macario Dorothyann HERO, MD  amLODipine  (NORVASC ) 5 MG tablet TAKE 1 TABLET (5 MG TOTAL) BY MOUTH DAILY. 10/24/23   Christia Budds, MD  aspirin  81 MG chewable tablet Chew 1 tablet (81 mg total) by mouth daily. 03/22/11   Feliciana Millman, MD  baclofen  (LIORESAL ) 10 MG tablet TAKE 1 TABLET BY MOUTH TWICE A DAY AS NEEDED FOR MUSCLE SPASMS 09/14/23   Christia Budds, MD  busPIRone  (BUSPAR ) 15 MG tablet TAKE 1 TABLET BY MOUTH TWICE A DAY 06/03/23   Macario Dorothyann HERO, MD  furosemide  (LASIX ) 20 MG tablet TAKE 1 TABLET BY MOUTH DAILY AS NEEDED. TAKE ONE DOSE LASIX  IF YOU DEVELOP 3 POUND WEIGHT GAIN OR MORE, WORSENING LEG SWELLING, OR SHORTNESS OF BREATH 05/09/23   Macario Dorothyann HERO, MD  guaiFENesin  (MUCINEX ) 600 MG 12 hr tablet Take 2 tablets (1,200 mg total) by mouth 2 (two) times daily as needed. 02/22/23   Ganta, Anupa, DO  Hydrocortisone ,  Perianal, 1 % CREA APPLY TO AFFECTED AREA TWICE A DAY 05/05/23   Macario Dorothyann HERO, MD  hydrOXYzine  (ATARAX ) 10 MG tablet Take 1 tablet (10 mg total) by mouth 3 (three) times daily as needed for anxiety. 05/05/23   Macario Dorothyann HERO, MD  insulin  aspart (FIASP  FLEXTOUCH) 100 UNIT/ML FlexTouch Pen INJECT 15 UNITS SUBCUTANEOUSLY DAILY WITH BREAKFAST, INJECT 9 UNITS WITH LUNCH AS NEEDED FOR CBG 180 OR MORE, AND INJECT 9 UNITS WITH SUPPER. NEEDS PCP APPOINTMENT FOR REPEAT A1C 11/28/23   Christia Budds, MD  insulin  glargine (LANTUS  SOLOSTAR) 100 UNIT/ML Solostar Pen INJECT 64 UNITS INTO THE SKIN DAILY. 05/05/23   Macario Dorothyann HERO, MD  Insulin  Pen Needle 32G X 6 MM MISC Use to dispense insulin  daily as instructed by physician. 05/05/23   Macario Dorothyann HERO, MD  levothyroxine  (SYNTHROID ) 50 MCG tablet Take 1 tablet (50 mcg total) by mouth every morning. 30 minutes before food 10/18/22   Macario Dorothyann HERO, MD  loperamide  (IMODIUM  A-D) 2 MG tablet Take 4 mg by mouth as needed for diarrhea or loose stools.    [provider]  Multiple Vitamin (MULTI VITAMIN) TABS Take 1 tablet by mouth daily.    [provider]  OneTouch Delica Lancets 33G MISC Please use to check blood sugar up to 4 times daily. E11.9 08/15/22   Macario Dorothyann HERO, MD  pantoprazole  (PROTONIX ) 40 MG tablet TAKE 1 TABLET BY MOUTH TWICE A DAY 09/02/23   Christia Budds, MD  polyethylene glycol powder (GLYCOLAX /MIRALAX ) 17 GM/SCOOP powder Take 17 g by mouth daily. Patient taking differently: Take 17 g by mouth daily as needed for mild constipation or moderate constipation. 12/03/22   Macario Dorothyann HERO, MD  pravastatin  (PRAVACHOL ) 40 MG tablet TAKE 1 TABLET BY MOUTH EVERY DAY 12/14/23   Joshua Domino, DO  sertraline  (ZOLOFT ) 100 MG tablet Take 1 tablet (100 mg total) by mouth daily. Patient taking differently: Take 100 mg by mouth in the morning. Take along with 50 mg twice daily for a total daily dose of 200mg . 11/22/22   Macario Dorothyann HERO, MD  sertraline  (ZOLOFT ) 50 MG tablet TAKE 1 TABLET IN THE AFTERNOON, ONE DOSE AT BEDTIME Patient taking differently: Take 50 mg by mouth 2 (two) times daily. At lunch and at bedtime. Take along with 100 mg tablet for a total daily dose of 200 mg. 11/22/22   Macario Dorothyann HERO, MD  spironolactone  (ALDACTONE ) 25 MG tablet TAKE ONE TABLET BY MOUTH 1 TIME A DAY 05/17/23   Macario Dorothyann HERO, MD  triamcinolone  ointment (KENALOG ) 0.5 % APPLY TO AFFECTED AREA TWICE A DAY 11/21/23   Christia Budds, MD  valsartan  (DIOVAN ) 320 MG tablet TAKE 1 TABLET BY MOUTH EVERY DAY Patient taking differently: Take 320 mg by mouth daily. 10/18/22   Macario Dorothyann HERO, MD  Vitamin E  400 units TABS Take 400 Units by mouth daily.    [provider]    Allergies: Baclofen , Hydrochlorothiazide , Liraglutide , Losartan  potassium, Lotensin  [benazepril ], Benazepril  hcl, Metoprolol succinate [metoprolol], and Beta adrenergic blockers    Review of Systems  Musculoskeletal:  Positive for arthralgias.    Updated Vital Signs BP (!) 160/58   Pulse 80   Temp 97.8 F (36.6 C)   Resp 16   LMP  (LMP Unknown)   SpO2 100%   Physical Exam Vitals and nursing note reviewed.  Constitutional:      Appearance: Normal appearance. She is not ill-appearing.  Eyes:     General: No scleral icterus. Pulmonary:     Effort: Pulmonary effort is normal. No respiratory distress.  Musculoskeletal:        General: Tenderness present. No deformity.     Comments: Patient with tenderness to palpation on right side of her ribs as well as right lower back over the area of the pelvis  Skin:    Coloration: Skin is not jaundiced.  Neurological:     General: No focal deficit present.     Mental Status: She is alert.  Psychiatric:        Mood and Affect: Mood normal.     (all labs ordered are listed, but only abnormal results are displayed) Labs Reviewed  I-STAT CHEM 8, ED    EKG: None  Radiology: DG Chest Portable 1  View Result Date: 01/08/2025 CLINICAL DATA:  Fall EXAM: PORTABLE CHEST 1 VIEW COMPARISON:  December 19, 2023 FINDINGS: Stable cardiomediastinal silhouette. Right lung is clear. Minimal left lingular subsegmental atelectasis or scarring is noted. Degenerative changes are seen involving both glenohumeral joints. IMPRESSION: Minimal left lingular subsegmental atelectasis or scarring. Electronically Signed   By: Lynwood Landy Raddle M.D.   On: 01/08/2025 16:54   DG Pelvis 1-2 Views Result Date: 01/08/2025 CLINICAL DATA:  Right hip pain after fall. EXAM: PELVIS - 1-2 VIEW COMPARISON:  October 27, 2016. FINDINGS: There is no evidence of pelvic fracture or  diastasis. No pelvic bone lesions are seen. Calcified uterine fibroid is again noted. IMPRESSION: No acute abnormality seen. Electronically Signed   By: Lynwood Landy Raddle M.D.   On: 01/08/2025 16:53     Procedures   Medications Ordered in the ED  fentaNYL  (SUBLIMAZE ) injection 50 mcg (50 mcg Intravenous Given 01/08/25 1733)                                 Medical Decision Making Amount and/or Complexity of Data Reviewed Radiology: ordered.   This patient presents to the ED for concern of hip pain after a fall, this involves an extensive number of treatment options, and is a complaint that carries with it a high risk of complications and morbidity.   Differential diagnosis includes: Hip fracture, hip dislocation, UTI, pyelonephritis, septic arthritis, hip contusion   Co morbidities:  see above  Lab Tests:  I Ordered, and personally interpreted labs.  The pertinent results include: No acute abnormalities  Imaging Studies:  I ordered imaging studies including chest x-ray, pelvic x-ray, CT abdomen pelvis I independently visualized and interpreted imaging which showed x-rays are negative for fracture.  CT has been ordered.  Not yet completed. I agree with the radiologist interpretation  Cardiac Monitoring/ECG:  The patient was maintained on a  cardiac monitor.  I personally viewed and interpreted the cardiac monitored which showed an underlying rhythm of: sinus rhythm  Medicines ordered and prescription drug management:  I ordered medication including  Medications  fentaNYL  (SUBLIMAZE ) injection 50 mcg (50 mcg Intravenous Given 01/08/25 1733)   for pain management Reevaluation of the patient after these medicines showed that the patient worsened I have reviewed the patients home medicines and have made adjustments as needed  Test Considered:   none  Critical Interventions:   none  Consultations Obtained: Hospitalist  Problem List / ED Course:     ICD-10-CM   1. Right hip pain  M25.551     2. Fall, initial encounter  [id number]       MDM: 84 year old female presents emergency department for evaluation of a mechanical fall.  No blood thinners.  Patient did not hit her head or lose consciousness.  CT head not indicated.  I did order an x-ray of patient's chest to evaluate for possible rib fractures as well as pelvic x-ray to evaluate for hip or pelvic fracture.  On reassessment, patient reports significant pain with sudden movements.  I did give her another dose of IV fentanyl , but patient reports significant pain with any movement.  I did not order a urinalysis as patient did not have any pain prior to her fall.  Patient does not feel like she is safe to go home at this time given her level of pain at this time.  She also states that she lives alone.  I will place a consult to the hospitalist for admission secondary to pain control.  Patient will be accepted by medicine.  They are requesting a CT of her pelvis to rule out occult fracture.  CT abdomen pelvis ordered.   Dispostion:  After consideration of the diagnostic results and the patients response to treatment, I feel that the patient would benefit from hospital admission.    Final diagnoses:  Right hip pain  Fall, initial encounter    ED Discharge Orders      None          Christinea Brizuela, Marry RAMAN, PA-C  01/08/25 1830 ° °"

## 2025-01-08 NOTE — Assessment & Plan Note (Signed)
-  No longer on CPAP

## 2025-01-08 NOTE — Subjective & Objective (Signed)
 Patient had a fall out of her motorized scooter today falling on her right side. No head injury, no LOC. She does not walk on her own power at baseline. Lives alone  Has not been able to tolerate much movement since. She was found on the floor by her son within an hour In ER plain imaged showing no fractures Her pain is not controlled despite repeated doses of fentanyl . No recent fever, she feels cold all the time  No cough, no CP no COUgh

## 2025-01-08 NOTE — Assessment & Plan Note (Signed)
 Order ssi  Lantus  30 units for tonight and monitor BG

## 2025-01-08 NOTE — Assessment & Plan Note (Signed)
-  chronic avoid nephrotoxic medications such as NSAIDs, Vanco Zosyn combo,  avoid hypotension, continue to follow renal function

## 2025-01-08 NOTE — Assessment & Plan Note (Signed)
 Contributing to comorbidity and complicating medical management     Nutritional follow up as an out pt would be recommended

## 2025-01-08 NOTE — H&P (Addendum)
 "    Linet C Mccleery FMW:[id number] DOB: April 03, 1941 DOA: 01/08/2025     PCP: Leontine Cramp, NP   Outpatient Specialists:   CARDS:   Dr. Vina Gull, MD    Patient arrived to ER on 01/08/25 at 1544 Referred by Attending Armenta Canning, MD   Patient coming from:    home Lives alone,     Chief Complaint:   Chief Complaint  Patient presents with   Fall    HPI: Rebecca Walter is a 84 y.o. female with medical history significant of CKD, DM2, HTN, diastolic CHF, hypothyrodism, HLD, kidney stones    Presented with  mechanical fall Patient had a fall out of her motorized scooter today falling on her right side. No head injury, no LOC. She does not walk on her own power at baseline. Lives alone  Has not been able to tolerate much movement since. She was found on the floor by her son within an hour In ER plain imaged showing no fractures Her pain is not controlled despite repeated doses of fentanyl . No recent fever, she feels cold all the time  No cough, no CP no COUgh      She dos take aspirin  81 mg po q day  Denies significant ETOH intake   Does not smoke     Regarding pertinent Chronic problems:    Hyperlipidemia - on statins Pravachol  (pravastatin )  Lipid Panel     Component Value Date/Time   CHOL 157 08/04/2022 1320   TRIG 230 (H) 08/04/2022 1320   HDL 41 08/04/2022 1320   CHOLHDL 3.8 08/04/2022 1320   CHOLHDL 4.2 07/16/2017 0402   VLDL 50 (H) 07/16/2017 0402   LDLCALC 78 08/04/2022 1320   LDLDIRECT 87 09/10/2021 1059   LDLDIRECT 103 (H) 03/11/2010 2127   LABVLDL 38 08/04/2022 1320     HTN on NOrvasc , Valsartan    chronic CHF diastolic - last echo  Recent Results (from the past 56199 hours)  ECHOCARDIOGRAM COMPLETE   Collection Time: 02/11/23  2:24 PM  Result Value   Weight 3,347.46   Height 60   BP 159/148   S' Lateral 3.40   AR max vel 3.23   AV Area VTI 3.07   AV Mean grad 7.0   AV Peak grad 13.5   Ao pk vel 1.84   Area-P 1/2 5.42   AV Area mean vel  3.16   Est EF 70 - 75%   Narrative      ECHOCARDIOGRAM REPORT        1. Difficult study due to poor echo windows.  2. Left ventricular ejection fraction, by estimation, is 70 to 75%. The left ventricle has hyperdynamic function. The left ventricle has no regional wall motion abnormalities. There is mild concentric left ventricular hypertrophy. Left ventricular  diastolic parameters are consistent with Grade II diastolic dysfunction (pseudonormalization).  3. Right ventricular systolic function is normal. The right ventricular size is mildly enlarged.  4. Left atrial size was mildly dilated.  5. Right atrial size was mildly dilated.  6. The mitral valve is grossly normal. Mild mitral valve regurgitation. Moderate mitral annular calcification.  7. The aortic valve is tricuspid. There is mild calcification of the aortic valve. There is mild thickening of the aortic valve. Aortic valve regurgitation is trivial. Aortic valve sclerosis/calcification is present, without any evidence of aortic  stenosis.             DM 2 -  Lab Results  Component Value Date  HGBA1C 7.9 (H) 02/15/2023   on insulin  Lantus  64 units ,    Hypothyroidism:   Lab Results  Component Value Date   TSH 5.180 (H) 04/21/2023   on synthroid     obesity-   BMI Readings from Last 1 Encounters:  02/14/24 37.77 kg/m      CKD stage IIIa baseline Cr 1.1 CrCl cannot be calculated (Patient's most recent lab result is older than the maximum 21 days allowed.).  Lab Results  Component Value Date   CREATININE 1.20 (H) 05/23/2023   CREATININE 0.96 04/21/2023   CREATININE 1.14 (H) 04/15/2023   Lab Results  Component Value Date   NA 140 05/23/2023   CL 102 05/23/2023   K 5.1 05/23/2023   CO2 23 05/23/2023   BUN 52 (H) 05/23/2023   CREATININE 1.20 (H) 05/23/2023   EGFR 45 (L) 05/23/2023   CALCIUM 9.2 05/23/2023   ALBUMIN 4.1 04/21/2023   GLUCOSE 197 (H) 05/23/2023    Cancer:    While in ER:         Lab  Orders         I-stat chem 8, ED (not at [facility], [facility] or [facility])     Pelvis - No acute abnormality seen.    CXR - Minimal left lingular subsegmental atelectasis or scarring.   CTabd/pelvis -thickened appearance of the left renal urothelium may be sequela of chronic infection or represent ascending UTI.    Following Medications were ordered in ER: Medications  fentaNYL  (SUBLIMAZE ) injection 50 mcg (50 mcg Intravenous Given 01/08/25 1733)    _______________    ED Triage Vitals [01/08/25 1556]  Encounter Vitals Group     BP (!) 160/58     Girls Systolic BP Percentile      Girls Diastolic BP Percentile      Boys Systolic BP Percentile      Boys Diastolic BP Percentile      Pulse Rate 80     Resp 16     Temp 97.8 F (36.6 C)     Temp src      SpO2 100 %     Weight      Height      Head Circumference      Peak Flow      Pain Score      Pain Loc      Pain Education      Exclude from Growth Chart   UFJK(75)@     _________________________________________ Significant initial  Findings: Abnormal Labs Reviewed - No abnormal labs to display      ECG: Ordered   The recent clinical data is shown below. Vitals:   01/08/25 1556  BP: (!) 160/58  Pulse: 80  Resp: 16  Temp: 97.8 F (36.6 C)  SpO2: 100%     WBC     Component Value Date/Time   WBC 10.3 04/21/2023 1718   WBC 11.1 (H) 02/21/2023 0215   LYMPHSABS 1.6 04/21/2023 1718   MONOABS 1.0 02/18/2023 0548   EOSABS 0.4 04/21/2023 1718   BASOSABS 0.1 04/21/2023 1718     __________________________________________________________ Recent Labs  Lab 01/08/25 1838 01/08/25 1844  NA 139 138  K 4.2 4.3  CO2  --  20*  GLUCOSE 169* 175*  BUN 40* 36*  CREATININE 1.20* 1.09*  CALCIUM  --  9.6    Cr   stable,   Lab Results  Component Value Date   CREATININE 1.20 (H) 05/23/2023   CREATININE 0.96 04/21/2023  CREATININE 1.14 (H) 04/15/2023    Recent Labs  Lab 01/08/25 1844  AST 20  ALT 14  ALKPHOS 77  BILITOT 0.3   PROT 7.2  ALBUMIN 3.9   Lab Results  Component Value Date   CALCIUM 9.2 05/23/2023    Plt: Lab Results  Component Value Date   PLT 207 04/21/2023         Recent Labs  Lab 01/08/25 1838 01/08/25 1844  WBC  --  12.7*  NEUTROABS  --  9.1*  HGB 12.2 12.0  HCT 36.0 35.6*  MCV  --  89.9  PLT  --  217    HG/HCT  stable,       Component Value Date/Time   HGB 11.3 04/21/2023 1718   HCT 36.1 04/21/2023 1718   MCV 91 04/21/2023 1718       _______________________________________________ Hospitalist was called for admission for   Right hip pain  Fall, initial encounter    The following Work up has been ordered so far:  Orders Placed This Encounter  Procedures   DG Pelvis 1-2 Views   DG Chest Portable 1 View   CT ABDOMEN PELVIS W CONTRAST   Consult to hospitalist   I-stat chem 8, ED (not at [facility], [facility] or [facility])     OTHER Significant initial  Findings:  labs showing:     DM  labs:  HbA1C: No results for input(s): HGBA1C in the last 8760 hours.     CBG (last 3)  No results for input(s): GLUCAP in the last 72 hours.        Cultures:    Component Value Date/Time   SDES BLOOD LEFT HAND 02/11/2023 0425   SPECREQUEST  02/11/2023 0425    BOTTLES DRAWN AEROBIC AND ANAEROBIC Blood Culture adequate volume   CULT  02/11/2023 0425    NO GROWTH 6 DAYS Performed at [facility], [address] [place], [place] [address]    REPTSTATUS 02/17/2023 FINAL 02/11/2023 0425     Radiological Exams on Admission: DG Chest Portable 1 View Result Date: 01/08/2025 CLINICAL DATA:  Fall EXAM: PORTABLE CHEST 1 VIEW COMPARISON:  December 19, 2023 FINDINGS: Stable cardiomediastinal silhouette. Right lung is clear. Minimal left lingular subsegmental atelectasis or scarring is noted. Degenerative changes are seen involving both glenohumeral joints. IMPRESSION: Minimal left lingular subsegmental atelectasis or scarring. Electronically Signed   By: Lynwood Landy Raddle M.D.   On:  01/08/2025 16:54   DG Pelvis 1-2 Views Result Date: 01/08/2025 CLINICAL DATA:  Right hip pain after fall. EXAM: PELVIS - 1-2 VIEW COMPARISON:  October 27, 2016. FINDINGS: There is no evidence of pelvic fracture or diastasis. No pelvic bone lesions are seen. Calcified uterine fibroid is again noted. IMPRESSION: No acute abnormality seen. Electronically Signed   By: Lynwood Landy Raddle M.D.   On: 01/08/2025 16:53   _______________________________________________________________________________________________________ Latest  Blood pressure (!) 160/58, pulse 80, temperature 97.8 F (36.6 C), resp. rate 16, SpO2 100%.   Vitals  labs and radiology finding personally reviewed  Review of Systems:    Pertinent positives include:  back pain   Constitutional:  No weight loss, night sweats, Fevers, chills, fatigue, weight loss  HEENT:  No headaches, Difficulty swallowing,Tooth/dental problems,Sore throat,  No sneezing, itching, ear ache, nasal congestion, post nasal drip,  Cardio-vascular:  No chest pain, Orthopnea, PND, anasarca, dizziness, palpitations.no Bilateral lower extremity swelling  GI:  No heartburn, indigestion, abdominal pain, nausea, vomiting, diarrhea, change in bowel habits, loss of  appetite, melena, blood in stool, hematemesis Resp:  no shortness of breath at rest. No dyspnea on exertion, No excess mucus, no productive cough, No non-productive cough, No coughing up of blood.No change in color of mucus.No wheezing. Skin:  no rash or lesions. No jaundice GU:  no dysuria, change in color of urine, no urgency or frequency. No straining to urinate.  No flank pain.  Musculoskeletal:  No joint pain or no joint swelling. No decreased range of motion. No back pain.  Psych:  No change in mood or affect. No depression or anxiety. No memory loss.  Neuro: no localizing neurological complaints, no tingling, no weakness, no double vision, no gait abnormality, no slurred speech, no  confusion  All systems reviewed and apart from HOPI all are negative _______________________________________________________________________________________________ Past Medical History:   Past Medical History:  Diagnosis Date   Acute pain of both knees 04/04/2022   Anxiety    Arthritis    Basal cell carcinoma 03/02/2021   Candidiasis of skin 02/14/2020   CARCINOMA, BASAL CELL 05/26/2007   Qualifier: History of  By: Feliciana MD, Rachel     Carpal tunnel syndrome of right wrist 11/26/2020   Cellulitis of left lower extremity 12/03/2022   Cervical spinal stenosis 05/11/2022   MRI Brain (05/11/22) shows moderate cervical spinal stenosis   Chronic diastolic CHF (congestive heart failure) (HCC)    Echocardiogram 01/2020: EF 70-75, no RWMA, Gr 1 DD, normal RVSF, RVSP 45.8, mild to mod LAE, trivial MR, mild PI   Coronavirus infection 02/16/2023   Diabetes mellitus    Diarrhea of presumed infectious origin 01/16/2021   DYSKINESIA, ESOPHAGUS 09/13/2007   Qualifier: Diagnosis of  By: Loreli MD, Kimberlee     Early satiety 09/04/2020   Elevated TSH 11/21/2017   Epistaxis 02/04/2020   Generalized weakness 12/15/2018   GERD (gastroesophageal reflux disease)    Gout    History of melanoma 01/31/2021   History of nuclear stress test    Myoview  06/2020: EF 75, no ischemia or infarction, low risk   Hoarseness 11/27/2019   Hyperlipidemia    Hypertension    Hypertensive heart disease with CHF (congestive heart failure) (HCC)    Hyponatremia    Leg fatigue 01/03/2021   Mild aortic stenosis 06/2017   Mild cognitive impairment 09/03/2022   Mild mitral regurgitation 06/2017   Moderate pulmonary valve insufficiency 06/2017   OBESITY, NOS 02/16/2007   Qualifier: Diagnosis of  By: Damien Folks     Osteopenia    Pneumonia of both lower lobes due to infectious organism 02/11/2023   Sepsis due to Acinetobacter baumannii with acute hypoxic respiratory failure without septic shock (HCC) 02/16/2023    SOB (shortness of breath) 02/11/2023   Vertigo 08/29/2013      Past Surgical History:  Procedure Laterality Date   KIDNEY SURGERY     KNEE SURGERY     MENISECTOMY     OTHER SURGICAL HISTORY Left    Epiretinal Peel Dr Roz (Ophth)   WRIST SURGERY      Social History:  Ambulatory  wheelchair bound,      reports that she has never smoked. She has never used smokeless tobacco. She reports that she does not drink alcohol  and does not use drugs.     Family History:   Family History  Problem Relation Age of Onset   Stroke Mother    Heart disease Father    Stroke Father    Hypertension Father    Cancer Sister  Cancer Brother    Heart disease Brother    Diabetes Maternal Uncle    ______________________________________________________________________________________________ Allergies: Allergies[1]   Prior to Admission medications  Medication Sig Start Date End Date Taking? Authorizing Provider  acetaminophen  (TYLENOL ) 325 MG tablet Take 325 mg by mouth every 8 (eight) hours as needed (pain).    [provider]  allopurinol  (ZYLOPRIM ) 100 MG tablet Take 1 tablet (100 mg total) by mouth daily. 03/10/23   Macario Dorothyann HERO, MD  amLODipine  (NORVASC ) 5 MG tablet TAKE 1 TABLET (5 MG TOTAL) BY MOUTH DAILY. 10/24/23   Christia Budds, MD  aspirin  81 MG chewable tablet Chew 1 tablet (81 mg total) by mouth daily. 03/22/11   Feliciana Millman, MD  baclofen  (LIORESAL ) 10 MG tablet TAKE 1 TABLET BY MOUTH TWICE A DAY AS NEEDED FOR MUSCLE SPASMS 09/14/23   Christia Budds, MD  busPIRone  (BUSPAR ) 15 MG tablet TAKE 1 TABLET BY MOUTH TWICE A DAY 06/03/23   Macario Dorothyann HERO, MD  furosemide  (LASIX ) 20 MG tablet TAKE 1 TABLET BY MOUTH DAILY AS NEEDED. TAKE ONE DOSE LASIX  IF YOU DEVELOP 3 POUND WEIGHT GAIN OR MORE, WORSENING LEG SWELLING, OR SHORTNESS OF BREATH 05/09/23   Macario Dorothyann HERO, MD  guaiFENesin  (MUCINEX ) 600 MG 12 hr tablet Take 2 tablets (1,200 mg total) by mouth 2  (two) times daily as needed. 02/22/23   Ganta, Anupa, DO  Hydrocortisone , Perianal, 1 % CREA APPLY TO AFFECTED AREA TWICE A DAY 05/05/23   Macario Dorothyann HERO, MD  hydrOXYzine  (ATARAX ) 10 MG tablet Take 1 tablet (10 mg total) by mouth 3 (three) times daily as needed for anxiety. 05/05/23   Macario Dorothyann HERO, MD  insulin  aspart (FIASP  FLEXTOUCH) 100 UNIT/ML FlexTouch Pen INJECT 15 UNITS SUBCUTANEOUSLY DAILY WITH BREAKFAST, INJECT 9 UNITS WITH LUNCH AS NEEDED FOR CBG 180 OR MORE, AND INJECT 9 UNITS WITH SUPPER. NEEDS PCP APPOINTMENT FOR REPEAT A1C 11/28/23   Christia Budds, MD  insulin  glargine (LANTUS  SOLOSTAR) 100 UNIT/ML Solostar Pen INJECT 64 UNITS INTO THE SKIN DAILY. 05/05/23   Macario Dorothyann HERO, MD  Insulin  Pen Needle 32G X 6 MM MISC Use to dispense insulin  daily as instructed by physician. 05/05/23   Macario Dorothyann HERO, MD  levothyroxine  (SYNTHROID ) 50 MCG tablet Take 1 tablet (50 mcg total) by mouth every morning. 30 minutes before food 10/18/22   Macario Dorothyann HERO, MD  loperamide  (IMODIUM  A-D) 2 MG tablet Take 4 mg by mouth as needed for diarrhea or loose stools.    [provider]  Multiple Vitamin (MULTI VITAMIN) TABS Take 1 tablet by mouth daily.    [provider]  OneTouch Delica Lancets 33G MISC Please use to check blood sugar up to 4 times daily. E11.9 08/15/22   Macario Dorothyann HERO, MD  pantoprazole  (PROTONIX ) 40 MG tablet TAKE 1 TABLET BY MOUTH TWICE A DAY 09/02/23   Christia Budds, MD  polyethylene glycol powder (GLYCOLAX /MIRALAX ) 17 GM/SCOOP powder Take 17 g by mouth daily. Patient taking differently: Take 17 g by mouth daily as needed for mild constipation or moderate constipation. 12/03/22   Macario Dorothyann HERO, MD  pravastatin  (PRAVACHOL ) 40 MG tablet TAKE 1 TABLET BY MOUTH EVERY DAY 12/14/23   Joshua Domino, DO  sertraline  (ZOLOFT ) 100 MG tablet Take 1 tablet (100 mg total) by mouth daily. Patient taking differently: Take 100 mg by mouth in the morning. Take along with 50  mg twice daily for a total daily dose of 200mg . 11/22/22   Macario,  Dorothyann HERO, MD  sertraline  (ZOLOFT ) 50 MG tablet TAKE 1 TABLET IN THE AFTERNOON, ONE DOSE AT BEDTIME Patient taking differently: Take 50 mg by mouth 2 (two) times daily. At lunch and at bedtime. Take along with 100 mg tablet for a total daily dose of 200 mg. 11/22/22   Macario Dorothyann HERO, MD  spironolactone  (ALDACTONE ) 25 MG tablet TAKE ONE TABLET BY MOUTH 1 TIME A DAY 05/17/23   Macario Dorothyann HERO, MD  triamcinolone  ointment (KENALOG ) 0.5 % APPLY TO AFFECTED AREA TWICE A DAY 11/21/23   Christia Budds, MD  valsartan  (DIOVAN ) 320 MG tablet TAKE 1 TABLET BY MOUTH EVERY DAY Patient taking differently: Take 320 mg by mouth daily. 10/18/22   Macario Dorothyann HERO, MD  Vitamin E  400 units TABS Take 400 Units by mouth daily.    [provider]    ___________________________________________________________________________________________________ Physical Exam:    01/08/2025    3:56 PM 02/14/2024    2:56 PM 05/23/2023    2:21 PM  Vitals with BMI  Height  5' 0   Weight  193 lbs 6 oz   BMI  37.77   Systolic 160  135  Diastolic 58  60  Pulse 80       1. General:  in No  Acute distress   Chronically ill   -appearing 2. Psychological: Alert and   Oriented 3. Head/ENT:   Moist  Mucous Membranes                          Head Non traumatic, neck supple                        Poor Dentition 4. SKIN: normal   Skin turgor,  Skin clean Dry and intact no rash    5. Heart: Regular rate and rhythm no  Murmur, no Rub or gallop 6. Lungs:  , no wheezes or crackles   7. Abdomen: Soft,  non-tender, Non distended   obese  bowel sounds present 8. Lower extremities: no clubbing, cyanosis, no  edema 9. Neurologically Grossly intact, moving all 4 extremities equally   10. MSK: Normal range of motion    Chart has been reviewed  ______________________________________________________________________________________________  Assessment/Plan   84 y.o. female with medical history significant of CKD, DM2, HTN, diastolic CHF, hypothyrodism, HLD, kidney stones   Admitted for   back pain Fall, initial encounter    Present on Admission:  Sacral pain  CKD (chronic kidney disease) stage 3, GFR 30-59 ml/min (HCC)  Hyperlipidemia associated with type 2 diabetes mellitus (HCC)  Hypertension associated with diabetes (HCC)  Hypothyroidism  Obstructive sleep apnea  Iliac crest bone pain  T11 vertebral fracture (HCC)     BMI 39.0-39.9,adult Contributing to comorbidity and complicating medical management      Nutritional follow up as an out pt would be recommended   CKD (chronic kidney disease) stage 3, GFR 30-59 ml/min (HCC)  -chronic avoid nephrotoxic medications such as NSAIDs, Vanco Zosyn combo,  avoid hypotension, continue to follow renal function   Hyperlipidemia associated with type 2 diabetes mellitus (HCC) Continue Pravachol  40 mg po q day  Hypertension associated with diabetes (HCC) Continue Norvasc  5 mg po q day  Hypothyroidism - Check TSH continue home medications Synthroid  at 50 mcg po q day   Obstructive sleep apnea No longer on CPAP  Iliac crest bone pain After a fall on the right, significant pain  CT  pelvis ordered  DM2 (diabetes mellitus, type 2) (HCC) Order ssi  Lantus  30 units for tonight and monitor BG  T11 vertebral fracture (HCC) CT showed T11 fracture that appears subacute or chronic and does not seem to correlate directly to the pain that patient is describing  CT showing a possible ascending urinary tract infection obtain UA patient not endorsing fevers or chills but she states she is incontinent of urine  Other plan as per orders.  DVT prophylaxis:  SCD       Code Status:    Code Status: Prior FULL CODE   as per patient   I had personally discussed CODE STATUS with patient    ACP   none    Family Communication:   Family not at  Bedside    Diet diabetic diet   Disposition Plan:      likely will need placement for rehabilitation                             Following barriers for discharge:                                                         Electrolytes corrected                                                          Pain controlled with PO medications                                      Consult Orders  (From admission, onward)           Start     Ordered   01/08/25 1751  Consult to hospitalist  Once       Provider:  (Not yet assigned)  Question Answer Comment  Place call to: [facility]   Reason for Consult Admit      01/08/25 1750                               Would benefit from PT/OT eval prior to DC  Ordered            Consults called:    NONE   Admission status:  ED Disposition     ED Disposition  Admit   Condition  --   Comment  Hospital Area: [facility] [[id number]]  Level of Care: Telemetry [5]  Admit to tele based on following criteria: Other see comments  Comments: need for iv narcs  May place patient in observation at [facility] or [facility] if equivalent level of care is available:: No  Diagnosis: Sacral pain [301853]  Admitting Physician: Oluwatosin Higginson [[id number]]  Attending Physician: Brenlyn Beshara [[id number]]           Obs     Level of care     tele  For 12H 2   Vic Esco 01/08/2025, 8:40 PM    [facility]  after 2 AM please page floor coverage   If 7AM-7PM, please contact the day team taking care of the patient using [email or web address]        [1]  Allergies Allergen Reactions   Baclofen  Other (See Comments)    Caused leg weakness   Hydrochlorothiazide  Other (See Comments)    Uric acid elevation and gout.      Liraglutide  Other (See Comments)    Tongue Glossitus   Losartan  Potassium     Other reaction(s): diarrhea   Lotensin  [Benazepril ] Other (See Comments)    Dry cough    Benazepril  Hcl     Other reaction(s): dry cough   Metoprolol Succinate  [Metoprolol]     Other reaction(s): coughing   Beta Adrenergic Blockers Other (See Comments)    Occurred with metoprolol  coughing   "

## 2025-01-08 NOTE — Assessment & Plan Note (Signed)
 CT showed T11 fracture that appears subacute or chronic and does not seem to correlate directly to the pain that patient is describing

## 2025-01-08 NOTE — ED Notes (Addendum)
 Patient had been changed an repositioned.

## 2025-01-08 NOTE — Assessment & Plan Note (Signed)
Continue Norvasc 5 mg po q day

## 2025-01-08 NOTE — ED Notes (Signed)
 Patient has been changed, cleaned and repositioned.

## 2025-01-09 ENCOUNTER — Ambulatory Visit: Admitting: Podiatry

## 2025-01-09 DIAGNOSIS — M533 Sacrococcygeal disorders, not elsewhere classified: Secondary | ICD-10-CM | POA: Diagnosis not present

## 2025-01-09 LAB — COMPREHENSIVE METABOLIC PANEL WITH GFR
ALT: 11 U/L (ref 0–44)
AST: 17 U/L (ref 15–41)
Albumin: 3.5 g/dL (ref 3.5–5.0)
Alkaline Phosphatase: 68 U/L (ref 38–126)
Anion gap: 9 (ref 5–15)
BUN: 29 mg/dL — ABNORMAL HIGH (ref 8–23)
CO2: 22 mmol/L (ref 22–32)
Calcium: 9.4 mg/dL (ref 8.9–10.3)
Chloride: 110 mmol/L (ref 98–111)
Creatinine, Ser: 0.98 mg/dL (ref 0.44–1.00)
GFR, Estimated: 56 mL/min — ABNORMAL LOW
Glucose, Bld: 110 mg/dL — ABNORMAL HIGH (ref 70–99)
Potassium: 4.5 mmol/L (ref 3.5–5.1)
Sodium: 140 mmol/L (ref 135–145)
Total Bilirubin: 0.2 mg/dL (ref 0.0–1.2)
Total Protein: 6.6 g/dL (ref 6.5–8.1)

## 2025-01-09 LAB — GLUCOSE, CAPILLARY
Glucose-Capillary: 111 mg/dL — ABNORMAL HIGH (ref 70–99)
Glucose-Capillary: 112 mg/dL — ABNORMAL HIGH (ref 70–99)
Glucose-Capillary: 145 mg/dL — ABNORMAL HIGH (ref 70–99)
Glucose-Capillary: 153 mg/dL — ABNORMAL HIGH (ref 70–99)
Glucose-Capillary: 165 mg/dL — ABNORMAL HIGH (ref 70–99)
Glucose-Capillary: 170 mg/dL — ABNORMAL HIGH (ref 70–99)

## 2025-01-09 LAB — CBC
HCT: 34.5 % — ABNORMAL LOW (ref 36.0–46.0)
Hemoglobin: 11.2 g/dL — ABNORMAL LOW (ref 12.0–15.0)
MCH: 29.6 pg (ref 26.0–34.0)
MCHC: 32.5 g/dL (ref 30.0–36.0)
MCV: 91 fL (ref 80.0–100.0)
Platelets: 199 K/uL (ref 150–400)
RBC: 3.79 MIL/uL — ABNORMAL LOW (ref 3.87–5.11)
RDW: 13.3 % (ref 11.5–15.5)
WBC: 10.2 K/uL (ref 4.0–10.5)
nRBC: 0 % (ref 0.0–0.2)

## 2025-01-09 LAB — MAGNESIUM: Magnesium: 2.2 mg/dL (ref 1.7–2.4)

## 2025-01-09 LAB — HEMOGLOBIN A1C
Hgb A1c MFr Bld: 9.1 % — ABNORMAL HIGH (ref 4.8–5.6)
Mean Plasma Glucose: 214.47 mg/dL

## 2025-01-09 LAB — PHOSPHORUS: Phosphorus: 4.2 mg/dL (ref 2.5–4.6)

## 2025-01-09 MED ORDER — CARMEX CLASSIC LIP BALM EX OINT
1.0000 | TOPICAL_OINTMENT | CUTANEOUS | Status: DC | PRN
  Administered 2025-01-09: 1 via TOPICAL
  Filled 2025-01-09: qty 10

## 2025-01-09 MED ORDER — SODIUM CHLORIDE 0.9 % IV SOLN
1.0000 g | INTRAVENOUS | Status: DC
  Administered 2025-01-09 – 2025-01-12 (×4): 1 g via INTRAVENOUS
  Filled 2025-01-09 (×4): qty 10

## 2025-01-09 MED ORDER — MUSCLE RUB 10-15 % EX CREA
1.0000 | TOPICAL_CREAM | CUTANEOUS | Status: DC | PRN
  Administered 2025-01-12 – 2025-01-17 (×6): 1 via TOPICAL
  Filled 2025-01-09: qty 85

## 2025-01-09 MED ORDER — INFLUENZA VAC SPLIT HIGH-DOSE 0.5 ML IM SUSY
0.5000 mL | PREFILLED_SYRINGE | INTRAMUSCULAR | Status: DC
  Filled 2025-01-09: qty 0.5

## 2025-01-09 MED ORDER — ACETAMINOPHEN 500 MG PO TABS
1000.0000 mg | ORAL_TABLET | Freq: Three times a day (TID) | ORAL | Status: DC
  Administered 2025-01-09 – 2025-01-16 (×17): 1000 mg via ORAL
  Administered 2025-01-16: 500 mg via ORAL
  Administered 2025-01-17 – 2025-01-18 (×5): 1000 mg via ORAL
  Filled 2025-01-09 (×26): qty 2

## 2025-01-09 MED ORDER — ALLOPURINOL 100 MG PO TABS
100.0000 mg | ORAL_TABLET | Freq: Every day | ORAL | Status: DC
  Administered 2025-01-09 – 2025-01-18 (×10): 100 mg via ORAL
  Filled 2025-01-09 (×10): qty 1

## 2025-01-09 MED ORDER — POLYETHYLENE GLYCOL 3350 17 G PO PACK: 17.0000 g | PACK | Freq: Every day | ORAL | Status: DC | PRN

## 2025-01-09 MED ORDER — BISACODYL 5 MG PO TBEC
5.0000 mg | DELAYED_RELEASE_TABLET | Freq: Every day | ORAL | Status: DC | PRN
  Administered 2025-01-09 – 2025-01-12 (×3): 5 mg via ORAL
  Filled 2025-01-09 (×3): qty 1

## 2025-01-09 NOTE — Progress Notes (Signed)
 " PROGRESS NOTE    Rebecca Walter  FMW:[id number] DOB: 11-11-1941 DOA: 01/08/2025 PCP: Leontine Cramp, NP  Brief Narrative: 84-year-old female with type 2 diabetes CKD stage IIIa diastolic heart failure hyperlipidemia hypothyroidism admitted after mechanical fall where she was leaning on her motorized scooter and fell on her right side.  She lives alone and was unable to ambulate in the ED and admitted to observation.  She did not lose consciousness or hit her head she is on aspirin  daily.  She accidentally pressed the gas instead of the brake while operating her mobile scooter inside the home causing her to fall forward and land on her right hip.  Assessment & Plan:   Principal Problem:   Sacral pain Active Problems:   Hyperlipidemia associated with type 2 diabetes mellitus (HCC)   Hypertension associated with diabetes (HCC)   CKD (chronic kidney disease) stage 3, GFR 30-59 ml/min (HCC)   BMI 39.0-39.9,adult   Obstructive sleep apnea   Hypothyroidism   DM2 (diabetes mellitus, type 2) (HCC)   Iliac crest bone pain   T11 vertebral fracture (HCC)   #1 status post mechanical fall with T11 fracture subacute to chronic with intractable sacral pain admitted for pain control PT and OT.  Seen by PT recommending a lot of help with walking and transfers and ADLs cooking and housework and transportation and help with stairs and ramp excetra.  Consult TOC for SNF. CT lumbar spine fracture involving T11 would be subacute or chronic in nature advanced DJD of the lumbar spine and multilevel canal stenosis and foraminal narrowing Pain control with Tylenol  and Oxy   #2 type 2 diabetes with hyperglycemia uncontrolled A1c 9.1 from this admission continue Semglee  and sliding scale insulin  CBG (last 3)  Recent Labs    01/09/25 0402 01/09/25 0743 01/09/25 1219  GLUCAP 111* 112* 145*    #3 CKD stage IIIa avoid nephrotoxins monitor closely  #4 hypertension continue Norvasc   #5 diastolic heart failure  stable  #6 hypothyroidism continue Synthroid   #7 hyperlipidemia continue Pravachol   #8 UTI UA consistent with that on Rocephin  follow-up culture   Estimated body mass index is 33.97 kg/m as calculated from the following:   Height as of this encounter: 5' (1.524 m).   Weight as of this encounter: 78.9 kg.  DVT prophylaxis: Lovenox   code Status: Full full  family Communication: None none  disposition Plan:  Status is: Observation   Consultants:  None  Procedures: None  Antimicrobials: Rocephin   Subjective: Resting in bed when I saw her she had not gotten out of bed but she was trying to order breakfast she had a rough night due to severe pain in her right side right leg and right lower extremity  Objective: Vitals:   01/08/25 1853 01/09/25 0208 01/09/25 0306 01/09/25 1009  BP: (!) 144/63 (!) 141/69  (!) 130/59  Pulse: 61 63  (!) 59  Resp: 18 18    Temp: 97.8 F (36.6 C) 98 F (36.7 C)  98.2 F (36.8 C)  TempSrc:  Oral  Oral  SpO2: 95% 95%  90%  Weight:   78.9 kg   Height:   5' (1.524 m)     Intake/Output Summary (Last 24 hours) at 01/09/2025 1349 Last data filed at 01/09/2025 0930 Gross per 24 hour  Intake 1120.02 ml  Output 925 ml  Net 195.02 ml   Filed Weights   01/09/25 0306  Weight: 78.9 kg    Examination:  General exam: Appears in mild  distress due to pain  respiratory system: Clear to auscultation. Respiratory effort normal. Cardiovascular system: reg trace bilateral edema Gastrointestinal system: Abdomen is nondistended, soft and nontender. No organomegaly or masses felt. Normal bowel sounds heard. Central nervous system: Alert and oriented. No focal neurological deficits. Extremities: Trace bilateral edema  Data Reviewed: I have personally reviewed following labs and imaging studies  CBC: Recent Labs  Lab 01/08/25 1838 01/08/25 1844 01/09/25 0544  WBC  --  12.7* 10.2  NEUTROABS  --  9.1*  --   HGB 12.2 12.0 11.2*  HCT 36.0 35.6* 34.5*   MCV  --  89.9 91.0  PLT  --  217 199   Basic Metabolic Panel: Recent Labs  Lab 01/08/25 1838 01/08/25 1844 01/08/25 2121 01/09/25 0544  NA 139 138  --  140  K 4.2 4.3  --  4.5  CL 107 107  --  110  CO2  --  20*  --  22  GLUCOSE 169* 175*  --  110*  BUN 40* 36*  --  29*  CREATININE 1.20* 1.09*  --  0.98  CALCIUM  --  9.6  --  9.4  MG  --   --  2.1 2.2  PHOS  --   --  4.0 4.2   GFR: Estimated Creatinine Clearance: 39 mL/min (by C-G formula based on SCr of 0.98 mg/dL). Liver Function Tests: Recent Labs  Lab 01/08/25 1844 01/09/25 0544  AST 20 17  ALT 14 11  ALKPHOS 77 68  BILITOT 0.3 0.2  PROT 7.2 6.6  ALBUMIN 3.9 3.5   No results for input(s): LIPASE, AMYLASE in the last 168 hours. No results for input(s): AMMONIA in the last 168 hours. Coagulation Profile: No results for input(s): INR, PROTIME in the last 168 hours. Cardiac Enzymes: No results for input(s): CKTOTAL, CKMB, CKMBINDEX, TROPONINI in the last 168 hours. BNP (last 3 results) No results for input(s): PROBNP in the last 8760 hours. HbA1C: Recent Labs    01/08/25 2121  HGBA1C 9.1*   CBG: Recent Labs  Lab 01/08/25 2033 01/08/25 2346 01/09/25 0402 01/09/25 0743 01/09/25 1219  GLUCAP 145* 129* 111* 112* 145*   Lipid Profile: No results for input(s): CHOL, HDL, LDLCALC, TRIG, CHOLHDL, LDLDIRECT in the last 72 hours. Thyroid  Function Tests: No results for input(s): TSH, T4TOTAL, FREET4, T3FREE, THYROIDAB in the last 72 hours. Anemia Panel: No results for input(s): VITAMINB12, FOLATE, FERRITIN, TIBC, IRON, RETICCTPCT in the last 72 hours. Sepsis Labs: No results for input(s): PROCALCITON, LATICACIDVEN in the last 168 hours.  No results found for this or any previous visit (from the past 240 hours).       Radiology Studies: CT L-SPINE NO CHARGE Result Date: 01/08/2025 CLINICAL DATA:  Recent fall EXAM: CT Lumbar Spine without  contrast TECHNIQUE: Technique: Multiplanar CT images of the lumbar spine were reconstructed from contemporary CT of the Abdomen and Pelvis. RADIATION DOSE REDUCTION: This exam was performed according to the departmental dose-optimization program which includes automated exposure control, adjustment of the mA and/or kV according to patient size and/or use of iterative reconstruction technique. CONTRAST:  None or No additional COMPARISON:  CT 09/12/2020, MRI 11/10/2016 FINDINGS: Segmentation: 5 lumbar type vertebrae. Alignment: Mild levoscoliosis. Grade 1 anterolisthesis L5 on S1. Trace retrolisthesis L2 on L3 and L3 on L4 Vertebrae: Chronic appearing superior endplate deformity at L4 with Schmorl's node. Remaining vertebra demonstrate normal stature. Fracture involving anterior syndesmophyte at T11, series 606, image 48 as well as the vertebral  body, 606 image 43. Margins are very sclerotic. No associated paravertebral soft tissue swelling. No lucency involving the posterior elements at this level. No other lucencies are visualized. Paraspinal and other soft tissues: No acute finding. See separately dictated abdominopelvic CT Disc levels: Chronic calcific density measuring 5 mm within the left posterior canal at T12. At T12-L1, no high-grade canal stenosis. Mild facet degenerative changes. Patent foramen. Small calcified posterior disc osteophyte complex At L1-L2, disc space narrowing and osteophyte. Disc bulge. No high-grade canal stenosis. Thickened ligamentum flavum and facet degenerative changes. No high-grade foraminal narrowing. At L2-L3, disc space narrowing and vacuum discs. Ligamentum flavum thickening and moderate facet degenerative changes. Suspect at least mild canal stenosis. Suspect left foraminal narrowing. At L3-L4, disc space narrowing and vacuum disc. Disc bulge eccentric to the left. Marked ligamentum flavum hypertrophy and hypertrophic facet degenerative changes with suspected moderate canal  stenosis. At L4-L5, disc space narrowing and vacuum discs. Diffuse disc bulge, ligamentum flavum thickening and hypertrophic facet degenerative changes with severe spinal stenosis. Suspected right foraminal narrowing. At L5-S1, disc space narrowing. Disc bulge, ligamentum flavum thickening and advanced hypertrophic facet degenerative changes with suspected moderate canal stenosis IMPRESSION: 1. Fracture involving anterior syndesmophyte at T11 as well as the anterior vertebral body. Margins are very sclerotic and there is no associated paravertebral soft tissue swelling. Suspect that this could be due to subacute or chronic fracture, correlate for point tenderness. No fracture lucency involving the posterior elements at this level. 2. Chronic appearing superior endplate deformity at L4 with Schmorl's node. 3. Advanced multilevel degenerative changes of the lumbar spine with multilevel canal stenosis and foraminal narrowing as described above. Electronically Signed   By: Luke Bun M.D.   On: 01/08/2025 21:26   CT ABDOMEN PELVIS W CONTRAST Result Date: 01/08/2025 CLINICAL DATA:  Rule out fracture.  Fall. EXAM: CT ABDOMEN AND PELVIS WITH CONTRAST TECHNIQUE: Multidetector CT imaging of the abdomen and pelvis was performed using the standard protocol following bolus administration of intravenous contrast. RADIATION DOSE REDUCTION: This exam was performed according to the departmental dose-optimization program which includes automated exposure control, adjustment of the mA and/or kV according to patient size and/or use of iterative reconstruction technique. CONTRAST:  OMNIPAQUE  IOHEXOL  300 MG/ML  SOLN COMPARISON:  None Available. FINDINGS: Lower chest: Minimal left lung base linear atelectasis/scarring. There is coronary vascular calcification. No intra-abdominal free air or free fluid. Hepatobiliary: The liver is unremarkable. No biliary dilatation. The gallbladder is unremarkable. Pancreas: Unremarkable. No  pancreatic ductal dilatation or surrounding inflammatory changes. Spleen: Normal in size without focal abnormality. Adrenals/Urinary Tract: The adrenal glands unremarkable. Left renal cortical scarring with areas of parenchymal atrophy and infarct sequela prior pyelonephritis. Mild thickened appearance of the urothelium of the left renal collecting system and ureter which may be chronic or represent ascending UTI. Correlation with urinalysis recommended. There is no hydronephrosis on either side. The urinary bladder is distended and grossly unremarkable. Stomach/Bowel: There is sigmoid diverticulosis. There is moderate stool throughout the colon. There is no bowel obstruction. The appendix is not visualized with certainty. No inflammatory changes identified in the right lower quadrant. Vascular/Lymphatic: Moderate aortoiliac atherosclerotic disease. The IVC is unremarkable. No portal venous gas. There is no adenopathy. Reproductive: Calcified uterine fibroids. No suspicious adnexal masses. Other: None Musculoskeletal: Osteopenia with degenerative changes of spine. No acute osseous pathology. IMPRESSION: 1. Mild thickened appearance of the left renal urothelium may be sequela of chronic infection or represent ascending UTI. Correlation with urinalysis recommended. 2. Sigmoid  diverticulosis. No bowel obstruction. 3.  Aortic Atherosclerosis (ICD10-I70.0). Electronically Signed   By: Vanetta Chou M.D.   On: 01/08/2025 19:54   DG Chest Portable 1 View Result Date: 01/08/2025 CLINICAL DATA:  Fall EXAM: PORTABLE CHEST 1 VIEW COMPARISON:  December 19, 2023 FINDINGS: Stable cardiomediastinal silhouette. Right lung is clear. Minimal left lingular subsegmental atelectasis or scarring is noted. Degenerative changes are seen involving both glenohumeral joints. IMPRESSION: Minimal left lingular subsegmental atelectasis or scarring. Electronically Signed   By: Lynwood Landy Raddle M.D.   On: 01/08/2025 16:54   DG Pelvis 1-2  Views Result Date: 01/08/2025 CLINICAL DATA:  Right hip pain after fall. EXAM: PELVIS - 1-2 VIEW COMPARISON:  October 27, 2016. FINDINGS: There is no evidence of pelvic fracture or diastasis. No pelvic bone lesions are seen. Calcified uterine fibroid is again noted. IMPRESSION: No acute abnormality seen. Electronically Signed   By: Lynwood Landy Raddle M.D.   On: 01/08/2025 16:53    Scheduled Meds:  amLODipine   10 mg Oral Daily   busPIRone   15 mg Oral BID   insulin  aspart  0-9 Units Subcutaneous Q4H   insulin  glargine-yfgn  30 Units Subcutaneous QHS   pantoprazole   40 mg Oral BID   pravastatin   40 mg Oral Daily   sertraline   50 mg Oral Daily   Continuous Infusions:  cefTRIAXone  (ROCEPHIN )  IV Stopped (01/09/25 0208)     LOS: 0 days    Rebecca KANDICE Hoots, Rebecca Walter 01/09/2025, 1:49 PM   "

## 2025-01-09 NOTE — Plan of Care (Signed)
" °  Problem: Education: Goal: Ability to describe self-care measures that may prevent or decrease complications (Diabetes Survival Skills Education) will improve Outcome: Progressing   Problem: Fluid Volume: Goal: Ability to maintain a balanced intake and output will improve Outcome: Progressing   Problem: Health Behavior/Discharge Planning: Goal: Ability to identify and utilize available resources and services will improve Outcome: Progressing Goal: Ability to manage health-related needs will improve Outcome: Progressing   Problem: Metabolic: Goal: Ability to maintain appropriate glucose levels will improve Outcome: Progressing   Problem: Nutritional: Goal: Maintenance of adequate nutrition will improve Outcome: Progressing   Problem: Skin Integrity: Goal: Risk for impaired skin integrity will decrease Outcome: Progressing   Problem: Pain Managment: Goal: General experience of comfort will improve and/or be controlled Outcome: Progressing   Problem: Safety: Goal: Ability to remain free from injury will improve Outcome: Progressing   Problem: Skin Integrity: Goal: Risk for impaired skin integrity will decrease Outcome: Progressing   "

## 2025-01-09 NOTE — Evaluation (Signed)
 Physical Therapy Evaluation Patient Details Name: Rebecca Walter MRN: [id number] DOB: 14-Jan-1941 Today's Date: 01/09/2025  History of Present Illness  84 y.o. female admitted with fall out of scooter. Imaging showed subacute/chronic T11 fracture. Dx of UTI.   Pt with medical history significant of CKD, DM2, HTN, diastolic CHF, hypothyrodism, HLD, kidney stones.  Clinical Impression  Pt admitted with above diagnosis. +2 mod assist supine to sit. +2 min assist sit to stand and to take a few pivotal steps from bed to recliner with RW. Activity tolerance limited by R shoulder and hip pain. Patient will benefit from continued inpatient follow up therapy, <3 hours/day.  Pt currently with functional limitations due to the deficits listed below (see PT Problem List). Pt will benefit from acute skilled PT to increase their independence and safety with mobility to allow discharge.           If plan is discharge home, recommend the following: A lot of help with walking and/or transfers;A lot of help with bathing/dressing/bathroom;Assistance with cooking/housework;Assist for transportation;Help with stairs or ramp for entrance   Can travel by private vehicle   No    Equipment Recommendations None recommended by PT  Recommendations for Other Services       Functional Status Assessment Patient has had a recent decline in their functional status and demonstrates the ability to make significant improvements in function in a reasonable and predictable amount of time.     Precautions / Restrictions Precautions Precautions: Fall Recall of Precautions/Restrictions: Intact Precaution/Restrictions Comments: pt denies h/o falls in past 6 months other than the one just prior to this admission Restrictions Weight Bearing Restrictions Per Provider Order: No      Mobility  Bed Mobility Overal bed mobility: Needs Assistance Bed Mobility: Supine to Sit     Supine to sit: Mod assist, +2 for physical  assistance, +2 for safety/equipment, HOB elevated     General bed mobility comments: assist to pivot hips to EOB and raise trunk, increased time, pt limited by R shoulder and hip pain    Transfers Overall transfer level: Needs assistance Equipment used: Rolling walker (2 wheels) Transfers: Sit to/from Stand, Bed to chair/wheelchair/BSC Sit to Stand: +2 physical assistance, +2 safety/equipment, Min assist   Step pivot transfers: Min assist, +2 safety/equipment       General transfer comment: assist to power up; pt able to take several pivotal steps from bed to recliner, no buckling nor loss of balance    Ambulation/Gait                  Stairs            Wheelchair Mobility     Tilt Bed    Modified Rankin (Stroke Patients Only)       Balance Overall balance assessment: Needs assistance, History of Falls Sitting-balance support: Feet supported, Single extremity supported Sitting balance-Leahy Scale: Poor Sitting balance - Comments: min A for posterior lean initially Postural control: Posterior lean Standing balance support: Bilateral upper extremity supported Standing balance-Leahy Scale: Poor                               Pertinent Vitals/Pain Pain Assessment Pain Score: 7  Pain Location: right shoulder (chronic) - down right shoulder Pain Descriptors / Indicators: Aching, Sore, Constant Pain Intervention(s): Limited activity within patient's tolerance, Monitored during session, Premedicated before session, Repositioned    Home Living Family/patient expects to be  discharged to:: Private residence Living Arrangements: Alone Available Help at Discharge: Personal care attendant;Available PRN/intermittently Type of Home: Apartment Home Access: Level entry       Home Layout: One level Home Equipment: Grab bars - toilet;Grab bars - tub/shower;Electric scooter;Wheelchair - Physiological Scientist (4 wheels) Additional Comments: Lives in senior  living apartment. M-W-F 8 hrs a week. Meals, cleans house, bathe, Family provides transportation    Prior Function Prior Level of Function : Needs assist       Physical Assist : ADLs (physical)   ADLs (physical): Bathing;IADLs Mobility Comments: walks with rollator, WC for going out; no other falls in past 6 months (other than one just PTA)       Extremity/Trunk Assessment   Upper Extremity Assessment Upper Extremity Assessment: Defer to OT evaluation    Lower Extremity Assessment Lower Extremity Assessment: Overall WFL for tasks assessed    Cervical / Trunk Assessment Cervical / Trunk Assessment: Normal  Communication   Communication Communication: Impaired Factors Affecting Communication: Hearing impaired    Cognition Arousal: Alert Behavior During Therapy: WFL for tasks assessed/performed                             Following commands: Intact       Cueing Cueing Techniques: Verbal cues     General Comments      Exercises     Assessment/Plan    PT Assessment Patient needs continued PT services  PT Problem List Decreased activity tolerance;Decreased mobility;Pain;Decreased balance       PT Treatment Interventions Gait training;Therapeutic exercise;Functional mobility training;Therapeutic activities;Patient/family education;Balance training    PT Goals (Current goals can be found in the Care Plan section)  Acute Rehab PT Goals Patient Stated Goal: to get stronger PT Goal Formulation: With patient/family Time For Goal Achievement: 01/23/25 Potential to Achieve Goals: Good    Frequency Min 2X/week     Co-evaluation PT/OT/SLP Co-Evaluation/Treatment: Yes Reason for Co-Treatment: To address functional/ADL transfers PT goals addressed during session: Mobility/safety with mobility;Balance;Proper use of DME         AM-PAC PT 6 Clicks Mobility  Outcome Measure Help needed turning from your back to your side while in a flat bed without  using bedrails?: A Little Help needed moving from lying on your back to sitting on the side of a flat bed without using bedrails?: A Lot Help needed moving to and from a bed to a chair (including a wheelchair)?: A Little Help needed standing up from a chair using your arms (e.g., wheelchair or bedside chair)?: A Lot Help needed to walk in hospital room?: A Lot Help needed climbing 3-5 steps with a railing? : Total 6 Click Score: 13    End of Session Equipment Utilized During Treatment: Gait belt Activity Tolerance: Patient limited by pain;Patient limited by fatigue Patient left: in chair;with chair alarm set;with call bell/phone within reach Nurse Communication: Mobility status PT Visit Diagnosis: Difficulty in walking, not elsewhere classified (R26.2)    Time: 8852-8791 PT Time Calculation (min) (ACUTE ONLY): 21 min   Charges:   PT Evaluation $PT Eval Moderate Complexity: 1 Mod   PT General Charges $$ ACUTE PT VISIT: 1 Visit         Sylvan Delon Copp PT 01/09/2025  Acute Rehabilitation Services  Office [phone]

## 2025-01-09 NOTE — Evaluation (Signed)
 Occupational Therapy Evaluation Patient Details Name: Rebecca Walter MRN: [id number] DOB: [birth year] Today's Date: 01/09/2025   History of Present Illness   84 y.o. female admitted with fall from scooter that knocked her down while trying to plug in to charge. Imaging showed subacute/chronic T11 fracture. Dx of UTI.   Pt with medical history significant of CKD, DM2, HTN, diastolic CHF, hypothyrodism, HLD, kidney stones.     Clinical Impressions Pt admitted with the above concerns. Pt currently with functional limitations due to the deficits listed below (see OT Problem List). Prior to admit, pt was Mod I for ADL's, received assistance for IADL's, and utilized Rolator inside the home and manual WC for in the community. Pt will benefit from acute skilled OT to increase their safety and independence with ADL and functional mobility for ADL to facilitate discharge. Patient will benefit from continued inpatient follow up therapy, <3 hours/day.       If plan is discharge home, recommend the following:   Two people to help with walking and/or transfers;A lot of help with bathing/dressing/bathroom     Functional Status Assessment   Patient has had a recent decline in their functional status and demonstrates the ability to make significant improvements in function in a reasonable and predictable amount of time.     Equipment Recommendations   None recommended by OT      Precautions/Restrictions   Precautions Precautions: Fall Recall of Precautions/Restrictions: Intact Precaution/Restrictions Comments: pt denies h/o falls in past 6 months other than the one just prior to this admission Restrictions Weight Bearing Restrictions Per Provider Order: No     Mobility Bed Mobility Overal bed mobility: Needs Assistance Bed Mobility: Supine to Sit     Supine to sit: Mod assist, +2 for physical assistance, +2 for safety/equipment, HOB elevated     General bed mobility comments:  assist to pivot hips to EOB and raise trunk, increased time, pt limited by R shoulder and hip pain. Posterior lean when initially seated EOB. Pt with fear of falling off bed and pushed back into extention. Able to self correct with max cueing for education.    Transfers Overall transfer level: Needs assistance Equipment used: Rolling walker (2 wheels) Transfers: Sit to/from Stand, Bed to chair/wheelchair/BSC Sit to Stand: +2 physical assistance, +2 safety/equipment, Min assist     Step pivot transfers: Min assist, +2 safety/equipment     General transfer comment: assist to power up using bed pad; pt able to take several pivotal steps from bed to recliner, no buckling or loss of balance      Balance Overall balance assessment: Needs assistance, History of Falls Sitting-balance support: Feet supported, Single extremity supported Sitting balance-Leahy Scale: Poor Sitting balance - Comments: min A for posterior lean initially Postural control: Posterior lean Standing balance support: Bilateral upper extremity supported, Reliant on assistive device for balance, During functional activity Standing balance-Leahy Scale: Poor        ADL either performed or assessed with clinical judgement   ADL       Grooming: Minimal assistance;Bed level   Upper Body Bathing: Maximal assistance;Bed level   Lower Body Bathing: Total assistance;Bed level   Upper Body Dressing : Maximal assistance;Sitting   Lower Body Dressing: Total assistance;Bed level   Toilet Transfer: Minimal assistance;+2 for physical assistance;Rolling walker (2 wheels)   Toileting- Clothing Manipulation and Hygiene: Total assistance;Sitting/lateral lean               Vision Baseline Vision/History: 4 Cataracts Ability  to See in Adequate Light: 0 Adequate Patient Visual Report: Other (comment) (light sensitivity. Prefers to have eyes closed during eval although will open when requested. Son present and reports that  pt wears sunglasses at home.) Vision Assessment?: Wears glasses for reading     Perception Perception: Not tested       Praxis Praxis: Not tested       Pertinent Vitals/Pain Pain Assessment Pain Assessment: 0-10 Pain Score: 8  Pain Location: right shoulder (chronic) down entire right side Pain Descriptors / Indicators: Aching, Sore, Constant Pain Intervention(s): Limited activity within patient's tolerance, Monitored during session, RN gave pain meds during session     Extremity/Trunk Assessment Upper Extremity Assessment Upper Extremity Assessment: Right hand dominant;RUE deficits/detail RUE Deficits / Details: Chronic Right shoulder pain although more painful since her fall. Limited ROM demonstrated d/t to pain. Unable to fully assess at eval RUE: Shoulder pain with ROM;Unable to fully assess due to pain   Lower Extremity Assessment Lower Extremity Assessment: RLE deficits/detail RLE Deficits / Details: Right hip pain although when medicated able to demonstrate A/ROM knee flexion/extension and complete MMT with PT while seated EOB.   Cervical / Trunk Assessment Cervical / Trunk Assessment: Other exceptions Cervical / Trunk Exceptions: body habitus   Communication Communication Communication: Impaired Factors Affecting Communication: Hearing impaired   Cognition Arousal: Alert Behavior During Therapy: WFL for tasks assessed/performed, Anxious Cognition: No apparent impairments     Following commands: Intact       Cueing  General Comments   Cueing Techniques: Verbal cues  Son present and supportive during session. Tele box battery dead during evaluation.           Home Living Family/patient expects to be discharged to:: Private residence Living Arrangements: Alone Available Help at Discharge: Personal care attendant;Available PRN/intermittently Type of Home: Apartment Home Access: Level entry     Home Layout: One level     Bathroom Shower/Tub:  Tub/shower unit;Sponge bathes at baseline   [employer]: Standard     Home Equipment: Grab bars - toilet;Grab bars - tub/shower;Electric scooter;Wheelchair - Physiological Scientist (4 wheels)   Additional Comments: Lives in senior living apartment. M-W-F 8 hrs a week. Meals, cleans house, bathe, Family provides transportation      Prior Functioning/Environment Prior Level of Function : Needs assist       Physical Assist : ADLs (physical)   ADLs (physical): Bathing;IADLs Mobility Comments: walks with rollator, WC for going out; no other falls in past 6 months (other than one just PTA)      OT Problem List: Decreased strength;Pain;Decreased activity tolerance;Decreased knowledge of use of DME or AE;Impaired balance (sitting and/or standing);Decreased safety awareness;Impaired UE functional use   OT Treatment/Interventions: Self-care/ADL training;Therapeutic activities;Therapeutic exercise;DME and/or AE instruction;Patient/family education;Balance training;Manual therapy      OT Goals(Current goals can be found in the care plan section)   Acute Rehab OT Goals Patient Stated Goal: pain medication OT Goal Formulation: With patient Time For Goal Achievement: 01/23/25 Potential to Achieve Goals: Good   OT Frequency:  Min 2X/week    Co-evaluation PT/OT/SLP Co-Evaluation/Treatment: Yes Reason for Co-Treatment: To address functional/ADL transfers   OT goals addressed during session: ADL's and self-care;Strengthening/ROM      AM-PAC OT 6 Clicks Daily Activity     Outcome Measure Help from another person eating meals?: A Little Help from another person taking care of personal grooming?: A Little Help from another person toileting, which includes using toliet, bedpan, or urinal?: Total Help from another  person bathing (including washing, rinsing, drying)?: Total Help from another person to put on and taking off regular upper body clothing?: A Lot Help from another person to put  on and taking off regular lower body clothing?: Total 6 Click Score: 11   End of Session Equipment Utilized During Treatment: Gait belt;Rolling walker (2 wheels) Nurse Communication: Mobility status;Patient requests pain meds  Activity Tolerance: Patient tolerated treatment well;Patient limited by pain Patient left: in chair;with chair alarm set;with family/visitor present  OT Visit Diagnosis: Unsteadiness on feet (R26.81);Muscle weakness (generalized) (M62.81);History of falling (Z91.81);Pain Pain - Right/Left: Right Pain - part of body: Shoulder;Arm;Hip                Time: 1128-1208 OT Time Calculation (min): 40 min Charges:  OT General Charges $OT Visit: 1 Visit OT Evaluation $OT Eval Moderate Complexity: 1 Mod OT Treatments $Self Care/Home Management : 8-22 mins  Leita Howell, OTR/L,CBIS  Supplemental OT - [facility] and [facility] Secure Chat Preferred    Chantrell Apsey, Leita BIRCH 01/09/2025, 2:05 PM

## 2025-01-10 DIAGNOSIS — M533 Sacrococcygeal disorders, not elsewhere classified: Secondary | ICD-10-CM | POA: Diagnosis not present

## 2025-01-10 LAB — VITAMIN D 25 HYDROXY (VIT D DEFICIENCY, FRACTURES): Vit D, 25-Hydroxy: 30.2 ng/mL (ref 30–100)

## 2025-01-10 LAB — GLUCOSE, CAPILLARY
Glucose-Capillary: 121 mg/dL — ABNORMAL HIGH (ref 70–99)
Glucose-Capillary: 144 mg/dL — ABNORMAL HIGH (ref 70–99)
Glucose-Capillary: 165 mg/dL — ABNORMAL HIGH (ref 70–99)
Glucose-Capillary: 126 mg/dL — ABNORMAL HIGH (ref 70–99)
Glucose-Capillary: 165 mg/dL — ABNORMAL HIGH (ref 70–99)

## 2025-01-10 MED ORDER — POLYETHYLENE GLYCOL 3350 17 G PO PACK: 17.0000 g | PACK | Freq: Every day | ORAL | Status: DC

## 2025-01-10 MED ORDER — LIDOCAINE 5 % EX PTCH
1.0000 | MEDICATED_PATCH | CUTANEOUS | Status: DC
  Administered 2025-01-10: 1 via TRANSDERMAL
  Filled 2025-01-10: qty 1

## 2025-01-10 MED ORDER — POLYETHYLENE GLYCOL 3350 17 G PO PACK
17.0000 g | PACK | Freq: Two times a day (BID) | ORAL | Status: DC
  Administered 2025-01-10 – 2025-01-18 (×11): 17 g via ORAL
  Filled 2025-01-10 (×12): qty 1

## 2025-01-10 NOTE — Care Management Important Message (Deleted)
 Important Message  Patient Details  Name: Rebecca Walter MRN: [id number] Date of Birth: 1941-04-24   Important Message Given:        Sonda Manuella Quill, RN 01/10/2025, 9:46 AM

## 2025-01-10 NOTE — TOC Initial Note (Signed)
 Transition of Care [facility]) - Initial/Assessment Note    Patient Details  Name: Rebecca Walter MRN: [id number] Date of Birth: November 24, 1941  Transition of [facility]) CM/SW Contact:    Sonda Manuella Quill, RN Phone Number: 01/10/2025, 5:17 PM  Clinical Narrative:                 Recc for SNF; spoke w/ pt in room; pt said she lives at home; she agreed to recc from PT; pt identified POC son Reyes Daring [phone]); transport by ambulance; insurance/PCP verified; she denied SDOH risks; pt has walker, wheelchair, scooter, Rollator; HH aide from [employer]; she does not have home oxygen ; pt has glasses, and upper dentures; pt said she is continent of bowel and bladder; she needs assistance with feedings; pt denied skin issues; explained SNF process and ins auth needed; pt said her facility of preference is [facility]; she would like bed search limited to [facility]; unable to obtain [employer] # due to disruption in [employer]; FL2 completed and sent for co-sign; faxed out for SNF bed; waiting offers; ins auth needed; passing on to oncoming IP CM for PASRR follow up.  Expected Discharge Plan: Skilled Nursing Facility Barriers to Discharge: Continued Medical Work up   Patient Goals and CMS Choice Patient states their goals for this hospitalization and ongoing recovery are:: short-term rehab     [employer] ownership interest in [facility].provided to:: Patient    Expected Discharge Plan and Services   Discharge Planning Services: CM Consult   Living arrangements for the past 2 months: Apartment                 DME Arranged: N/A DME Agency: NA       HH Arranged: NA HH Agency: NA        Prior Living Arrangements/Services Living arrangements for the past 2 months: Apartment Lives with:: Self Patient language and need for interpreter reviewed:: Yes Do you feel safe going back to the place where you live?: Yes      Need for Family Participation in Patient Care: Yes  (Comment) Care giver support system in place?: Yes (comment) Current home services: DME (walker, wheelchair, scooter, Rollator; HH aide from [employer]) Criminal Activity/Legal Involvement Pertinent to Current Situation/Hospitalization: No - Comment as needed  Activities of Daily Living   ADL Screening (condition at time of admission) Independently performs ADLs?: No Does the patient have a NEW difficulty with bathing/dressing/toileting/self-feeding that is expected to last >3 days?: Yes (Initiates electronic notice to provider for possible OT consult) Does the patient have a NEW difficulty with getting in/out of bed, walking, or climbing stairs that is expected to last >3 days?: Yes (Initiates electronic notice to provider for possible PT consult) Does the patient have a NEW difficulty with communication that is expected to last >3 days?: No Is the patient deaf or have difficulty hearing?: No Does the patient have difficulty seeing, even when wearing glasses/contacts?: No Does the patient have difficulty concentrating, remembering, or making decisions?: No  Permission Sought/Granted Permission sought to share information with : Case Manager Permission granted to share information with : Yes, Verbal Permission Granted  Share Information with NAME: Case Manager     Permission granted to share info w Relationship: Reyes Daring (son) [phone]     Emotional Assessment Appearance:: Appears stated age Attitude/Demeanor/Rapport: Gracious Affect (typically observed): Accepting Orientation: : Oriented to Self, Oriented to Place, Oriented to  Time, Oriented to Situation Alcohol  / Substance Use: Not  Applicable Psych Involvement: No (comment)  Admission diagnosis:  Right hip pain [M25.551] Sacral pain [M53.3] Fall, initial encounter [W19.XXXA] Patient Active Problem List   Diagnosis Date Noted   Sacral pain 01/08/2025   Iliac crest bone pain 01/08/2025   T11 vertebral fracture (HCC)  01/08/2025   HYPERTENSION, BENIGN SYSTEMIC 05/15/2023   Acute renal impairment 04/21/2023   Hyperkalemia 04/21/2023   Chronic anemia 04/21/2023   Leg swelling 04/01/2023   Urinary incontinence 04/01/2023   Acute hypoxemic respiratory failure (HCC) 02/10/2023   Healthcare maintenance 01/09/2023   Skin rash 12/07/2022   Mild cognitive impairment 09/03/2022   Mood disorder 09/03/2022   BMI 39.0-39.9,adult 09/02/2022   Morbid obesity (HCC) 06/24/2022   Cerebral atrophy, mild 06/24/2022   Cervical spinal stenosis 05/11/2022   Hypothyroidism 05/05/2022   Formed visual hallucinations 04/15/2022   CKD (chronic kidney disease) stage 3, GFR 30-59 ml/min (HCC) 01/13/2022   Esophageal dysphagia 03/27/2021   Need for shingles vaccine 01/31/2021   Abnormal MRI, spinal cord 01/03/2021   Bilateral leg numbness 12/31/2020   Laryngopharyngeal reflux (LPR) 06/19/2020   Frequent falls 04/17/2020   Restless leg syndrome 01/23/2020   Obstructive sleep apnea 12/04/2019   Diabetic autonomic neuropathy (HCC) 01/24/2019   Chronically dry eyes, right 12/15/2018   Hypertension associated with diabetes (HCC) 10/17/2018   HFpEF 70-75% 07/15/2017   Mild aortic stenosis 06/19/2017   Moderate pulmonary valve insufficiency 06/19/2017   Lumbar back pain with radiculopathy affecting right lower extremity 10/29/2016   Allergic rhinitis 03/22/2011   INSOMNIA 09/16/2009   Gastroparesis 08/12/2009   Anxiety 10/18/2008   DM2 (diabetes mellitus, type 2) (HCC) 02/16/2007   Hyperlipidemia associated with type 2 diabetes mellitus (HCC) 02/16/2007   GASTROESOPHAGEAL REFLUX, NO ESOPHAGITIS 02/16/2007   Osteoarthritis of both knees 02/16/2007   Osteopenia after menopause 02/16/2007   Gout 02/16/2007   PCP:  Leontine Cramp, NP Pharmacy:   [facility] [phone] - [place] - [address] AT [facility] [address] [place] [address] Phone: [phone] Fax:  [phone]     Social Drivers of Health (SDOH) Social History: SDOH Screenings   Food Insecurity: No Food Insecurity (01/10/2025)  Housing: Low Risk (01/10/2025)  Transportation Needs: No Transportation Needs (01/10/2025)  Utilities: Not At Risk (01/10/2025)  Alcohol  Screen: Low Risk (05/08/2023)  Depression (PHQ2-9): Low Risk (05/23/2023)  Financial Resource Strain: Low Risk (05/08/2023)  Physical Activity: Inactive (05/08/2023)  Social Connections: Moderately Integrated (01/09/2025)  Stress: No Stress Concern Present (05/08/2023)  Tobacco Use: Low Risk (01/08/2025)   SDOH Interventions: Food Insecurity Interventions: Intervention Not Indicated, Inpatient TOC Housing Interventions: Intervention Not Indicated, Inpatient TOC Transportation Interventions: Intervention Not Indicated, Inpatient TOC Utilities Interventions: Intervention Not Indicated, Inpatient TOC   Readmission Risk Interventions     No data to display

## 2025-01-10 NOTE — Plan of Care (Signed)
   Problem: Coping: Goal: Ability to adjust to condition or change in health will improve Outcome: Progressing

## 2025-01-10 NOTE — [place] FL2 (Signed)
 " [place]  MEDICAID [id number] LEVEL OF CARE FORM     IDENTIFICATION  Patient Name: Rebecca Walter Birthdate: [birth year] Sex: female Admission Date (Current Location): 01/08/2025  [facility] and [place] Number:      Facility and Address:  [facility],  [address], [place] [address]      Provider Number: [id number]  Attending Physician Name and Address:  Will Almarie MATSU, MD  Relative Name and Phone Number:  Reyes Daring (son) [phone]    Current Level of Care: Hospital Recommended Level of Care: Skilled Nursing Facility Prior Approval Number:    Date Approved/Denied:   PASRR Number: pending  Discharge Plan: SNF    Current Diagnoses: Patient Active Problem List   Diagnosis Date Noted   Sacral pain 01/08/2025   Iliac crest bone pain 01/08/2025   T11 vertebral fracture (HCC) 01/08/2025   HYPERTENSION, BENIGN SYSTEMIC 05/15/2023   Acute renal impairment 04/21/2023   Hyperkalemia 04/21/2023   Chronic anemia 04/21/2023   Leg swelling 04/01/2023   Urinary incontinence 04/01/2023   Acute hypoxemic respiratory failure (HCC) 02/10/2023   Healthcare maintenance 01/09/2023   Skin rash 12/07/2022   Mild cognitive impairment 09/03/2022   Mood disorder 09/03/2022   BMI 39.0-39.9,adult 09/02/2022   Morbid obesity (HCC) 06/24/2022   Cerebral atrophy, mild 06/24/2022   Cervical spinal stenosis 05/11/2022   Hypothyroidism 05/05/2022   Formed visual hallucinations 04/15/2022   CKD (chronic kidney disease) stage 3, GFR 30-59 ml/min (HCC) 01/13/2022   Esophageal dysphagia 03/27/2021   Need for shingles vaccine 01/31/2021   Abnormal MRI, spinal cord 01/03/2021   Bilateral leg numbness 12/31/2020   Laryngopharyngeal reflux (LPR) 06/19/2020   Frequent falls 04/17/2020   Restless leg syndrome 01/23/2020   Obstructive sleep apnea 12/04/2019   Diabetic autonomic neuropathy (HCC) 01/24/2019   Chronically dry eyes, right 12/15/2018   Hypertension associated with  diabetes (HCC) 10/17/2018   HFpEF 70-75% 07/15/2017   Mild aortic stenosis 06/19/2017   Moderate pulmonary valve insufficiency 06/19/2017   Lumbar back pain with radiculopathy affecting right lower extremity 10/29/2016   Allergic rhinitis 03/22/2011   INSOMNIA 09/16/2009   Gastroparesis 08/12/2009   Anxiety 10/18/2008   DM2 (diabetes mellitus, type 2) (HCC) 02/16/2007   Hyperlipidemia associated with type 2 diabetes mellitus (HCC) 02/16/2007   GASTROESOPHAGEAL REFLUX, NO ESOPHAGITIS 02/16/2007   Osteoarthritis of both knees 02/16/2007   Osteopenia after menopause 02/16/2007   Gout 02/16/2007    Orientation RESPIRATION BLADDER Height & Weight     Self, Time, Situation  Normal External catheter Weight: 78.9 kg Height:  5' (152.4 cm)  BEHAVIORAL SYMPTOMS/MOOD NEUROLOGICAL BOWEL NUTRITION STATUS      Continent Diet (heart healthy/carb modified)  AMBULATORY STATUS COMMUNICATION OF NEEDS Skin   Limited Assist Verbally Other (Comment) (erythema/redness bilateral buttocks)                       Personal Care Assistance Level of Assistance  Bathing, Feeding, Dressing Bathing Assistance: Limited assistance Feeding assistance: Limited assistance Dressing Assistance: Limited assistance     Functional Limitations Info  Sight, Hearing, Speech Sight Info: Impaired (glasses) Hearing Info: Adequate Speech Info: Adequate    SPECIAL CARE FACTORS FREQUENCY  PT (By licensed PT), OT (By licensed OT)     PT Frequency: 5x/week OT Frequency: 5x/week            Contractures Contractures Info: Not present    Additional Factors Info  Code Status, Allergies, Insulin  Sliding Scale Code Status  Info: Full Code Allergies Info: Baclofen , Hydrochlorothiazide , Liraglutide , Losartan  Potassium, Lotensin  (Benazepril ), Benazepril  Hcl, Metoprolol Succinate (Metoprolol), Beta Adrenergic Blockers   Insulin  Sliding Scale Info: See MAR       Current Medications (01/10/2025):  This is the  current hospital active medication list Current Facility-Administered Medications  Medication Dose Route Frequency Provider Last Rate Last Admin   acetaminophen  (TYLENOL ) tablet 650 mg  650 mg Oral Q6H PRN Doutova, Anastassia, MD       Or   acetaminophen  (TYLENOL ) suppository 650 mg  650 mg Rectal Q6H PRN Doutova, Anastassia, MD       acetaminophen  (TYLENOL ) tablet 1,000 mg  1,000 mg Oral TID Will Almarie MATSU, MD   1,000 mg at 01/09/25 2118   allopurinol  (ZYLOPRIM ) tablet 100 mg  100 mg Oral Daily Will Almarie MATSU, MD   100 mg at 01/10/25 9089   amLODipine  (NORVASC ) tablet 10 mg  10 mg Oral Daily Doutova, Anastassia, MD   10 mg at 01/10/25 0910   bisacodyl  (DULCOLAX) EC tablet 5 mg  5 mg Oral Daily PRN Chavez, Abigail, NP   5 mg at 01/09/25 2117   busPIRone  (BUSPAR ) tablet 15 mg  15 mg Oral BID Doutova, Anastassia, MD   15 mg at 01/10/25 0910   cefTRIAXone  (ROCEPHIN ) 1 g in sodium chloride  0.9 % 100 mL IVPB  1 g Intravenous Q24H Doutova, Anastassia, MD 200 mL/hr at 01/10/25 0434 1 g at 01/10/25 0434   HYDROcodone -acetaminophen  (NORCO/VICODIN) 5-325 MG per tablet 1-2 tablet  1-2 tablet Oral Q4H PRN Doutova, Anastassia, MD   2 tablet at 01/10/25 0910   HYDROmorphone  (DILAUDID ) injection 0.5-1 mg  0.5-1 mg Intravenous Q3H PRN Doutova, Anastassia, MD   1 mg at 01/09/25 2100   Influenza vac split trivalent PF (FLUZONE  HIGH-DOSE) injection 0.5 mL  0.5 mL Intramuscular Tomorrow-1000 Will Almarie MATSU, MD       insulin  aspart (novoLOG ) injection 0-9 Units  0-9 Units Subcutaneous Q4H Doutova, Anastassia, MD   1 Units at 01/10/25 1623   insulin  glargine-yfgn (SEMGLEE ) injection 30 Units  30 Units Subcutaneous QHS Doutova, Anastassia, MD   30 Units at 01/09/25 2118   lidocaine  (LIDODERM ) 5 % 1 patch  1 patch Transdermal Q24H Will Almarie MATSU, MD   1 patch at 01/10/25 1207   lip balm (CARMEX) ointment 1 Application  1 Application Topical PRN Will Almarie MATSU, MD   1 Application at 01/09/25  2115   Muscle Rub CREA 1 Application  1 Application Topical PRN Will Almarie MATSU, MD       ondansetron  (ZOFRAN ) tablet 4 mg  4 mg Oral Q6H PRN Doutova, Anastassia, MD       Or   ondansetron  (ZOFRAN ) injection 4 mg  4 mg Intravenous Q6H PRN Doutova, Anastassia, MD   4 mg at 01/09/25 1523   pantoprazole  (PROTONIX ) EC tablet 40 mg  40 mg Oral BID Doutova, Anastassia, MD   40 mg at 01/10/25 0910   polyethylene glycol (MIRALAX  / GLYCOLAX ) packet 17 g  17 g Oral BID Will Almarie MATSU, MD       pravastatin  (PRAVACHOL ) tablet 40 mg  40 mg Oral Daily Doutova, Anastassia, MD   40 mg at 01/10/25 9089   sertraline  (ZOLOFT ) tablet 50 mg  50 mg Oral Daily Doutova, Anastassia, MD   50 mg at 01/10/25 9089     Discharge Medications: Please see discharge summary for a list of discharge medications.  Relevant Imaging Results:  Relevant Lab Results:  Additional Information    Sonda Manuella Quill, RN     "

## 2025-01-10 NOTE — Care Management Obs Status (Signed)
 MEDICARE OBSERVATION STATUS NOTIFICATION   Patient Details  Name: Rebecca Walter MRN: [id number] Date of Birth: 1940/12/23   Medicare Observation Status Notification Given:  Yes    Sonda Manuella Quill, RN 01/10/2025, 9:45 AM

## 2025-01-10 NOTE — Progress Notes (Signed)
 " PROGRESS NOTE    Rebecca Walter  FMW:[id number] DOB: 1941-11-22 DOA: 01/08/2025 PCP: Leontine Cramp, NP  Brief Narrative: 84-year-old female with type 2 diabetes CKD stage IIIa diastolic heart failure hyperlipidemia hypothyroidism admitted after mechanical fall where she was leaning on her motorized scooter and fell on her right side.  She lives alone and was unable to ambulate in the ED and admitted to observation.  She did not lose consciousness or hit her head she is on aspirin  daily.  She accidentally pressed the gas instead of the brake while operating her mobile scooter inside the home causing her to fall forward and land on her right hip.  Assessment & Plan:   Principal Problem:   Sacral pain Active Problems:   Hyperlipidemia associated with type 2 diabetes mellitus (HCC)   Hypertension associated with diabetes (HCC)   CKD (chronic kidney disease) stage 3, GFR 30-59 ml/min (HCC)   BMI 39.0-39.9,adult   Obstructive sleep apnea   Hypothyroidism   DM2 (diabetes mellitus, type 2) (HCC)   Iliac crest bone pain   T11 vertebral fracture (HCC)   #1 status post mechanical fall with T11 fracture subacute to chronic with intractable sacral pain admitted for pain control PT and OT.  Seen by PT recommending SNF to obtain prior level of functioning.  Consult TOC for SNF. CT lumbar spine fracture involving T11 would be subacute or chronic in nature advanced DJD of the lumbar spine and multilevel canal stenosis and foraminal narrowing Pain control with Tylenol  and Oxy   #2 type 2 diabetes with hyperglycemia uncontrolled A1c 9.1 from this admission continue Semglee  and sliding scale insulin  CBG (last 3)  Recent Labs    01/09/25 2338 01/10/25 0419 01/10/25 0718  GLUCAP 153* 121* 144*    #3 CKD stage IIIa avoid nephrotoxins monitor closely  #4 hypertension continue Norvasc   #5 diastolic heart failure stable  #6 hypothyroidism continue Synthroid   #7 hyperlipidemia continue Pravachol   #8  UTI UA consistent with uti  Culture ecoli Sens pending On rocephin   #9 constipation miralax   Senna    Estimated body mass index is 33.97 kg/m as calculated from the following:   Height as of this encounter: 5' (1.524 m).   Weight as of this encounter: 78.9 kg.  DVT prophylaxis: Lovenox   code Status: Full full  family Communication: None none  disposition Plan:  Status is: Observation   Consultants:  None  Procedures: None  Antimicrobials: Rocephin   Subjective:   C/o constipation  Objective: Vitals:   01/09/25 1424 01/09/25 1529 01/09/25 1941 01/10/25 0437  BP: (!) 183/70 (!) 149/61 (!) 123/47 (!) 156/53  Pulse: (!) 58 64 62 64  Resp:   18 15  Temp: 98.3 F (36.8 C)  98.3 F (36.8 C) 97.9 F (36.6 C)  TempSrc: Oral     SpO2: 92%  (!) 89% 92%  Weight:      Height:        Intake/Output Summary (Last 24 hours) at 01/10/2025 1127 Last data filed at 01/10/2025 9071 Gross per 24 hour  Intake 720 ml  Output 350 ml  Net 370 ml   Filed Weights   01/09/25 0306  Weight: 78.9 kg    Examination:  General exam: Appears in mild distress due to pain  respiratory system: Clear to auscultation. Respiratory effort normal. Cardiovascular system: reg trace bilateral edema Gastrointestinal system: Abdomen is nondistended, soft and nontender. No organomegaly or masses felt. Normal bowel sounds heard. Central nervous system: Alert and oriented.  No focal neurological deficits. Extremities: Trace bilateral edema  Data Reviewed: I have personally reviewed following labs and imaging studies  CBC: Recent Labs  Lab 01/08/25 1838 01/08/25 1844 01/09/25 0544  WBC  --  12.7* 10.2  NEUTROABS  --  9.1*  --   HGB 12.2 12.0 11.2*  HCT 36.0 35.6* 34.5*  MCV  --  89.9 91.0  PLT  --  217 199   Basic Metabolic Panel: Recent Labs  Lab 01/08/25 1838 01/08/25 1844 01/08/25 2121 01/09/25 0544  NA 139 138  --  140  K 4.2 4.3  --  4.5  CL 107 107  --  110  CO2  --  20*  --   22  GLUCOSE 169* 175*  --  110*  BUN 40* 36*  --  29*  CREATININE 1.20* 1.09*  --  0.98  CALCIUM  --  9.6  --  9.4  MG  --   --  2.1 2.2  PHOS  --   --  4.0 4.2   GFR: Estimated Creatinine Clearance: 39 mL/min (by C-G formula based on SCr of 0.98 mg/dL). Liver Function Tests: Recent Labs  Lab 01/08/25 1844 01/09/25 0544  AST 20 17  ALT 14 11  ALKPHOS 77 68  BILITOT 0.3 0.2  PROT 7.2 6.6  ALBUMIN 3.9 3.5   No results for input(s): LIPASE, AMYLASE in the last 168 hours. No results for input(s): AMMONIA in the last 168 hours. Coagulation Profile: No results for input(s): INR, PROTIME in the last 168 hours. Cardiac Enzymes: No results for input(s): CKTOTAL, CKMB, CKMBINDEX, TROPONINI in the last 168 hours. BNP (last 3 results) No results for input(s): PROBNP in the last 8760 hours. HbA1C: Recent Labs    01/08/25 2121  HGBA1C 9.1*   CBG: Recent Labs  Lab 01/09/25 1628 01/09/25 1957 01/09/25 2338 01/10/25 0419 01/10/25 0718  GLUCAP 170* 165* 153* 121* 144*   Lipid Profile: No results for input(s): CHOL, HDL, LDLCALC, TRIG, CHOLHDL, LDLDIRECT in the last 72 hours. Thyroid  Function Tests: No results for input(s): TSH, T4TOTAL, FREET4, T3FREE, THYROIDAB in the last 72 hours. Anemia Panel: No results for input(s): VITAMINB12, FOLATE, FERRITIN, TIBC, IRON, RETICCTPCT in the last 72 hours. Sepsis Labs: No results for input(s): PROCALCITON, LATICACIDVEN in the last 168 hours.  Recent Results (from the past 240 hours)  Urine Culture (for pregnant, neutropenic or urologic patients or patients with an indwelling urinary catheter)     Status: Abnormal (Preliminary result)   Collection Time: 01/08/25  9:08 PM   Specimen: Urine, Clean Catch  Result Value Ref Range Status   Specimen Description   Final    URINE, CLEAN CATCH Performed at [facility], [address] [place], [place] [address]     Special Requests   Final    NONE Performed at [facility], [address], [place], [place] [address]    Culture >=100,000 COLONIES/mL ESCHERICHIA COLI (A)  Final   Report Status PENDING  Incomplete         Radiology Studies: CT L-SPINE NO CHARGE Result Date: 01/08/2025 CLINICAL DATA:  Recent fall EXAM: CT Lumbar Spine without contrast TECHNIQUE: Technique: Multiplanar CT images of the lumbar spine were reconstructed from contemporary CT of the Abdomen and Pelvis. RADIATION DOSE REDUCTION: This exam was performed according to the departmental dose-optimization program which includes automated exposure control, adjustment of the mA and/or kV according to patient size and/or use of iterative reconstruction technique. CONTRAST:  None or No additional COMPARISON:  CT 09/12/2020, MRI 11/10/2016 FINDINGS: Segmentation: 5 lumbar type vertebrae. Alignment: Mild levoscoliosis. Grade 1 anterolisthesis L5 on S1. Trace retrolisthesis L2 on L3 and L3 on L4 Vertebrae: Chronic appearing superior endplate deformity at L4 with Schmorl's node. Remaining vertebra demonstrate normal stature. Fracture involving anterior syndesmophyte at T11, series 606, image 48 as well as the vertebral body, 606 image 43. Margins are very sclerotic. No associated paravertebral soft tissue swelling. No lucency involving the posterior elements at this level. No other lucencies are visualized. Paraspinal and other soft tissues: No acute finding. See separately dictated abdominopelvic CT Disc levels: Chronic calcific density measuring 5 mm within the left posterior canal at T12. At T12-L1, no high-grade canal stenosis. Mild facet degenerative changes. Patent foramen. Small calcified posterior disc osteophyte complex At L1-L2, disc space narrowing and osteophyte. Disc bulge. No high-grade canal stenosis. Thickened ligamentum flavum and facet degenerative changes. No high-grade foraminal narrowing. At L2-L3, disc space  narrowing and vacuum discs. Ligamentum flavum thickening and moderate facet degenerative changes. Suspect at least mild canal stenosis. Suspect left foraminal narrowing. At L3-L4, disc space narrowing and vacuum disc. Disc bulge eccentric to the left. Marked ligamentum flavum hypertrophy and hypertrophic facet degenerative changes with suspected moderate canal stenosis. At L4-L5, disc space narrowing and vacuum discs. Diffuse disc bulge, ligamentum flavum thickening and hypertrophic facet degenerative changes with severe spinal stenosis. Suspected right foraminal narrowing. At L5-S1, disc space narrowing. Disc bulge, ligamentum flavum thickening and advanced hypertrophic facet degenerative changes with suspected moderate canal stenosis IMPRESSION: 1. Fracture involving anterior syndesmophyte at T11 as well as the anterior vertebral body. Margins are very sclerotic and there is no associated paravertebral soft tissue swelling. Suspect that this could be due to subacute or chronic fracture, correlate for point tenderness. No fracture lucency involving the posterior elements at this level. 2. Chronic appearing superior endplate deformity at L4 with Schmorl's node. 3. Advanced multilevel degenerative changes of the lumbar spine with multilevel canal stenosis and foraminal narrowing as described above. Electronically Signed   By: Luke Bun M.D.   On: 01/08/2025 21:26   CT ABDOMEN PELVIS W CONTRAST Result Date: 01/08/2025 CLINICAL DATA:  Rule out fracture.  Fall. EXAM: CT ABDOMEN AND PELVIS WITH CONTRAST TECHNIQUE: Multidetector CT imaging of the abdomen and pelvis was performed using the standard protocol following bolus administration of intravenous contrast. RADIATION DOSE REDUCTION: This exam was performed according to the departmental dose-optimization program which includes automated exposure control, adjustment of the mA and/or kV according to patient size and/or use of iterative reconstruction technique.  CONTRAST:  100mL OMNIPAQUE  IOHEXOL  300 MG/ML  SOLN COMPARISON:  None Available. FINDINGS: Lower chest: Minimal left lung base linear atelectasis/scarring. There is coronary vascular calcification. No intra-abdominal free air or free fluid. Hepatobiliary: The liver is unremarkable. No biliary dilatation. The gallbladder is unremarkable. Pancreas: Unremarkable. No pancreatic ductal dilatation or surrounding inflammatory changes. Spleen: Normal in size without focal abnormality. Adrenals/Urinary Tract: The adrenal glands unremarkable. Left renal cortical scarring with areas of parenchymal atrophy and infarct sequela prior pyelonephritis. Mild thickened appearance of the urothelium of the left renal collecting system and ureter which may be chronic or represent ascending UTI. Correlation with urinalysis recommended. There is no hydronephrosis on either side. The urinary bladder is distended and grossly unremarkable. Stomach/Bowel: There is sigmoid diverticulosis. There is moderate stool throughout the colon. There is no bowel obstruction. The appendix is not visualized with certainty. No inflammatory changes identified in the right lower  quadrant. Vascular/Lymphatic: Moderate aortoiliac atherosclerotic disease. The IVC is unremarkable. No portal venous gas. There is no adenopathy. Reproductive: Calcified uterine fibroids. No suspicious adnexal masses. Other: None Musculoskeletal: Osteopenia with degenerative changes of spine. No acute osseous pathology. IMPRESSION: 1. Mild thickened appearance of the left renal urothelium may be sequela of chronic infection or represent ascending UTI. Correlation with urinalysis recommended. 2. Sigmoid diverticulosis. No bowel obstruction. 3.  Aortic Atherosclerosis (ICD10-I70.0). Electronically Signed   By: Vanetta Chou M.D.   On: 01/08/2025 19:54   DG Chest Portable 1 View Result Date: 01/08/2025 CLINICAL DATA:  Fall EXAM: PORTABLE CHEST 1 VIEW COMPARISON:  December 19, 2023  FINDINGS: Stable cardiomediastinal silhouette. Right lung is clear. Minimal left lingular subsegmental atelectasis or scarring is noted. Degenerative changes are seen involving both glenohumeral joints. IMPRESSION: Minimal left lingular subsegmental atelectasis or scarring. Electronically Signed   By: Lynwood Landy Raddle M.D.   On: 01/08/2025 16:54   DG Pelvis 1-2 Views Result Date: 01/08/2025 CLINICAL DATA:  Right hip pain after fall. EXAM: PELVIS - 1-2 VIEW COMPARISON:  October 27, 2016. FINDINGS: There is no evidence of pelvic fracture or diastasis. No pelvic bone lesions are seen. Calcified uterine fibroid is again noted. IMPRESSION: No acute abnormality seen. Electronically Signed   By: Lynwood Landy Raddle M.D.   On: 01/08/2025 16:53    Scheduled Meds:  acetaminophen   1,000 mg Oral TID   allopurinol   100 mg Oral Daily   amLODipine   10 mg Oral Daily   busPIRone   15 mg Oral BID   Influenza vac split trivalent PF  0.5 mL Intramuscular Tomorrow-1000   insulin  aspart  0-9 Units Subcutaneous Q4H   insulin  glargine-yfgn  30 Units Subcutaneous QHS   pantoprazole   40 mg Oral BID   pravastatin   40 mg Oral Daily   sertraline   50 mg Oral Daily   Continuous Infusions:  cefTRIAXone  (ROCEPHIN )  IV 1 g (01/10/25 0434)     LOS: 0 days    Almarie KANDICE Hoots, MD 01/10/2025, 11:27 AM   "

## 2025-01-10 NOTE — Plan of Care (Signed)
" °  Problem: Education: Goal: Ability to describe self-care measures that may prevent or decrease complications (Diabetes Survival Skills Education) will improve Outcome: Progressing   Problem: Skin Integrity: Goal: Risk for impaired skin integrity will decrease Outcome: Progressing   Problem: Tissue Perfusion: Goal: Adequacy of tissue perfusion will improve Outcome: Progressing   Problem: Education: Goal: Knowledge of General Education information will improve Description: Including pain rating scale, medication(s)/side effects and non-pharmacologic comfort measures Outcome: Progressing   Problem: Pain Managment: Goal: General experience of comfort will improve and/or be controlled Outcome: Progressing   Problem: Safety: Goal: Ability to remain free from injury will improve Outcome: Progressing   Problem: Skin Integrity: Goal: Risk for impaired skin integrity will decrease Outcome: Progressing   "

## 2025-01-11 DIAGNOSIS — M533 Sacrococcygeal disorders, not elsewhere classified: Secondary | ICD-10-CM | POA: Diagnosis not present

## 2025-01-11 LAB — GLUCOSE, CAPILLARY
Glucose-Capillary: 114 mg/dL — ABNORMAL HIGH (ref 70–99)
Glucose-Capillary: 123 mg/dL — ABNORMAL HIGH (ref 70–99)
Glucose-Capillary: 144 mg/dL — ABNORMAL HIGH (ref 70–99)
Glucose-Capillary: 153 mg/dL — ABNORMAL HIGH (ref 70–99)
Glucose-Capillary: 158 mg/dL — ABNORMAL HIGH (ref 70–99)
Glucose-Capillary: 179 mg/dL — ABNORMAL HIGH (ref 70–99)

## 2025-01-11 MED ORDER — CYCLOBENZAPRINE HCL 5 MG PO TABS
5.0000 mg | ORAL_TABLET | Freq: Three times a day (TID) | ORAL | Status: DC | PRN
  Administered 2025-01-12 – 2025-01-16 (×4): 5 mg via ORAL
  Filled 2025-01-11 (×4): qty 1

## 2025-01-11 MED ORDER — LIDOCAINE 5 % EX PTCH
3.0000 | MEDICATED_PATCH | CUTANEOUS | Status: DC
  Administered 2025-01-11 – 2025-01-18 (×8): 3 via TRANSDERMAL
  Filled 2025-01-11 (×8): qty 3

## 2025-01-11 MED ORDER — BISACODYL 10 MG RE SUPP
10.0000 mg | Freq: Once | RECTAL | Status: AC
  Administered 2025-01-11: 10 mg via RECTAL
  Filled 2025-01-11: qty 1

## 2025-01-11 NOTE — Progress Notes (Signed)
 " PROGRESS NOTE    Rebecca Walter  FMW:[id number] DOB: February 13, 1941 DOA: 01/08/2025 PCP: Leontine Cramp, NP  Brief Narrative: 84-year-old female with type 2 diabetes CKD stage IIIa diastolic heart failure hyperlipidemia hypothyroidism admitted after mechanical fall where she was leaning on her motorized scooter and fell on her right side.  She lives alone and was unable to ambulate in the ED and admitted to observation.  She did not lose consciousness or hit her head she is on aspirin  daily.  She accidentally pressed the gas instead of the brake while operating her mobile scooter inside the home causing her to fall forward and land on her right hip.  Assessment & Plan:   Principal Problem:   Sacral pain Active Problems:   Hyperlipidemia associated with type 2 diabetes mellitus (HCC)   Hypertension associated with diabetes (HCC)   CKD (chronic kidney disease) stage 3, GFR 30-59 ml/min (HCC)   BMI 39.0-39.9,adult   Obstructive sleep apnea   Hypothyroidism   DM2 (diabetes mellitus, type 2) (HCC)   Iliac crest bone pain   T11 vertebral fracture (HCC)   #1 status post mechanical fall with T11 fracture subacute to chronic with intractable sacral pain admitted for pain control PT and OT.  Seen by PT recommending SNF to obtain prior level of functioning.  Consult TOC for SNF. CT lumbar spine fracture involving T11 would be subacute or chronic in nature advanced DJD of the lumbar spine and multilevel canal stenosis and foraminal narrowing Pain control with Tylenol  and Oxy Continue lidocaine  patch Added Flexeril    #2 type 2 diabetes with hyperglycemia uncontrolled A1c 9.1 from this admission continue Semglee  and sliding scale insulin  CBG (last 3)  Recent Labs    01/11/25 0408 01/11/25 0746 01/11/25 1146  GLUCAP 114* 123* 144*    #3 CKD stage IIIa avoid nephrotoxins monitor closely  #4 hypertension continue Norvasc   #5 diastolic heart failure stable  #6 hypothyroidism continue  Synthroid   #7 hyperlipidemia continue Pravachol   #8 UTI UA consistent with uti urine culture showing E. coli sensitivity pending continue Rocephin .  #9 constipation miralax   Senna Collect suppository    Estimated body mass index is 33.97 kg/m as calculated from the following:   Height as of this encounter: 5' (1.524 m).   Weight as of this encounter: 78.9 kg.  DVT prophylaxis: Lovenox   code Status: Full full  family Communication: None none  disposition Plan:  Status is: Observation   Consultants:  None  Procedures: None  Antimicrobials: Rocephin   Subjective:  Still complaining of constipation has not had a BM, with a lot of spasm and pain in her back and left shoulder Xray left shoulder is pending  Objective: Vitals:   01/10/25 0437 01/10/25 1129 01/11/25 0336 01/11/25 1029  BP: (!) 156/53 (!) 151/79 (!) 136/54 (!) 140/63  Pulse: 64 61 (!) 58 (!) 58  Resp: 15 18 16 17   Temp: 97.9 F (36.6 C) 97.8 F (36.6 C) 99 F (37.2 C) 98.4 F (36.9 C)  TempSrc:   Oral   SpO2: 92% 92% 92% 92%  Weight:      Height:       No intake or output data in the 24 hours ending 01/11/25 1317  Filed Weights   01/09/25 0306  Weight: 78.9 kg    Examination:  General exam: Appears in mild distress due to pain  respiratory system: Clear to auscultation. Respiratory effort normal. Cardiovascular system: reg trace bilateral edema Gastrointestinal system: Abdomen is nondistended, soft and  nontender. No organomegaly or masses felt. Normal bowel sounds heard. Central nervous system: Alert and oriented. No focal neurological deficits. Extremities: Trace bilateral edema  Data Reviewed: I have personally reviewed following labs and imaging studies  CBC: Recent Labs  Lab 01/08/25 1838 01/08/25 1844 01/09/25 0544  WBC  --  12.7* 10.2  NEUTROABS  --  9.1*  --   HGB 12.2 12.0 11.2*  HCT 36.0 35.6* 34.5*  MCV  --  89.9 91.0  PLT  --  217 199   Basic Metabolic Panel: Recent  Labs  Lab 01/08/25 1838 01/08/25 1844 01/08/25 2121 01/09/25 0544  NA 139 138  --  140  K 4.2 4.3  --  4.5  CL 107 107  --  110  CO2  --  20*  --  22  GLUCOSE 169* 175*  --  110*  BUN 40* 36*  --  29*  CREATININE 1.20* 1.09*  --  0.98  CALCIUM  --  9.6  --  9.4  MG  --   --  2.1 2.2  PHOS  --   --  4.0 4.2   GFR: Estimated Creatinine Clearance: 39 mL/min (by C-G formula based on SCr of 0.98 mg/dL). Liver Function Tests: Recent Labs  Lab 01/08/25 1844 01/09/25 0544  AST 20 17  ALT 14 11  ALKPHOS 77 68  BILITOT 0.3 0.2  PROT 7.2 6.6  ALBUMIN 3.9 3.5   No results for input(s): LIPASE, AMYLASE in the last 168 hours. No results for input(s): AMMONIA in the last 168 hours. Coagulation Profile: No results for input(s): INR, PROTIME in the last 168 hours. Cardiac Enzymes: No results for input(s): CKTOTAL, CKMB, CKMBINDEX, TROPONINI in the last 168 hours. BNP (last 3 results) No results for input(s): PROBNP in the last 8760 hours. HbA1C: Recent Labs    01/08/25 2121  HGBA1C 9.1*   CBG: Recent Labs  Lab 01/10/25 2004 01/11/25 0033 01/11/25 0408 01/11/25 0746 01/11/25 1146  GLUCAP 165* 153* 114* 123* 144*   Lipid Profile: No results for input(s): CHOL, HDL, LDLCALC, TRIG, CHOLHDL, LDLDIRECT in the last 72 hours. Thyroid  Function Tests: No results for input(s): TSH, T4TOTAL, FREET4, T3FREE, THYROIDAB in the last 72 hours. Anemia Panel: No results for input(s): VITAMINB12, FOLATE, FERRITIN, TIBC, IRON, RETICCTPCT in the last 72 hours. Sepsis Labs: No results for input(s): PROCALCITON, LATICACIDVEN in the last 168 hours.  Recent Results (from the past 240 hours)  Urine Culture (for pregnant, neutropenic or urologic patients or patients with an indwelling urinary catheter)     Status: Abnormal (Preliminary result)   Collection Time: 01/08/25  9:08 PM   Specimen: Urine, Clean Catch  Result Value Ref Range  Status   Specimen Description   Final    URINE, CLEAN CATCH Performed at [facility], [address] [place], [place] [address]    Special Requests   Final    NONE Performed at [facility], [address], [place], [place] [address]    Culture (A)  Final    >=100,000 COLONIES/mL ESCHERICHIA COLI SUSCEPTIBILITIES TO FOLLOW Performed at [facility], [address] [place], [place] [address]    Report Status PENDING  Incomplete         Radiology Studies: No results found.   Scheduled Meds:  acetaminophen   1,000 mg Oral TID   allopurinol   100 mg Oral Daily   amLODipine   10 mg Oral Daily   bisacodyl   10 mg  Rectal Once   busPIRone   15 mg Oral BID   Influenza vac split trivalent PF  0.5 mL Intramuscular Tomorrow-1000   insulin  aspart  0-9 Units Subcutaneous Q4H   insulin  glargine-yfgn  30 Units Subcutaneous QHS   lidocaine   3 patch Transdermal Q24H   pantoprazole   40 mg Oral BID   polyethylene glycol  17 g Oral BID   pravastatin   40 mg Oral Daily   sertraline   50 mg Oral Daily   Continuous Infusions:  cefTRIAXone  (ROCEPHIN )  IV 1 g (01/11/25 0419)     LOS: 0 days    Almarie KANDICE Hoots, MD 01/11/2025, 1:17 PM   "
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
## 2025-01-11 NOTE — TOC Progression Note (Signed)
 Transition of Care [facility]) - Progression Note    Patient Details  Name: Rebecca Walter MRN: [id number] Date of Birth: 04-Jun-1941  [facility]) CM/SW Contact  Tawni CHRISTELLA Eva, LCSW Phone Number: 01/11/2025, 4:00 PM  Clinical Narrative:     Pt was offered a bed at preferred facility, [facility]. Pts PASRR is pending at this time. Once PASRR is assigned and the pt is close to being medically stable, ICM will initiate insurance authorization. ICM to follow.   Expected Discharge Plan: Skilled Nursing Facility Barriers to Discharge: Continued Medical Work up               Expected Discharge Plan and Services   Discharge Planning Services: CM Consult   Living arrangements for the past 2 months: Apartment                 DME Arranged: N/A DME Agency: NA       HH Arranged: NA HH Agency: NA         Social Drivers of Health (SDOH) Interventions SDOH Screenings   Food Insecurity: No Food Insecurity (01/10/2025)  Housing: Low Risk (01/10/2025)  Transportation Needs: No Transportation Needs (01/10/2025)  Utilities: Not At Risk (01/10/2025)  Alcohol  Screen: Low Risk (05/08/2023)  Depression (PHQ2-9): Low Risk (05/23/2023)  Financial Resource Strain: Low Risk (05/08/2023)  Physical Activity: Inactive (05/08/2023)  Social Connections: Moderately Integrated (01/09/2025)  Stress: No Stress Concern Present (05/08/2023)  Tobacco Use: Low Risk (01/08/2025)    Readmission Risk Interventions     No data to display

## 2025-01-11 NOTE — Progress Notes (Signed)
 30 Day PASRR Note   Patient Details  Name: Rebecca Walter Date of Birth: May 11, 1941   Transition of Care [facility]) CM/SW Contact:    Tawni CHRISTELLA Eva, LCSW Phone Number: 01/11/2025, 3:59 PM  To Whom It May Concern:  Please be advised that this patient will require a short-term nursing home stay - anticipated 30 days or less for rehabilitation and strengthening.   The plan is for return home.

## 2025-01-11 NOTE — Plan of Care (Signed)
" °  Problem: Education: Goal: Ability to describe self-care measures that may prevent or decrease complications (Diabetes Survival Skills Education) will improve Outcome: Progressing   Problem: Coping: Goal: Ability to adjust to condition or change in health will improve Outcome: Progressing   Problem: Fluid Volume: Goal: Ability to maintain a balanced intake and output will improve Outcome: Progressing   Problem: Metabolic: Goal: Ability to maintain appropriate glucose levels will improve Outcome: Progressing   Problem: Skin Integrity: Goal: Risk for impaired skin integrity will decrease Outcome: Progressing   Problem: Tissue Perfusion: Goal: Adequacy of tissue perfusion will improve Outcome: Progressing   Problem: Nutrition: Goal: Adequate nutrition will be maintained Outcome: Progressing   Problem: Safety: Goal: Ability to remain free from injury will improve Outcome: Progressing   Problem: Skin Integrity: Goal: Risk for impaired skin integrity will decrease Outcome: Progressing   "
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
## 2025-01-12 ENCOUNTER — Observation Stay (HOSPITAL_COMMUNITY)

## 2025-01-12 DIAGNOSIS — M533 Sacrococcygeal disorders, not elsewhere classified: Secondary | ICD-10-CM | POA: Diagnosis not present

## 2025-01-12 LAB — GLUCOSE, CAPILLARY
Glucose-Capillary: 114 mg/dL — ABNORMAL HIGH (ref 70–99)
Glucose-Capillary: 132 mg/dL — ABNORMAL HIGH (ref 70–99)
Glucose-Capillary: 137 mg/dL — ABNORMAL HIGH (ref 70–99)
Glucose-Capillary: 139 mg/dL — ABNORMAL HIGH (ref 70–99)
Glucose-Capillary: 139 mg/dL — ABNORMAL HIGH (ref 70–99)
Glucose-Capillary: 150 mg/dL — ABNORMAL HIGH (ref 70–99)
Glucose-Capillary: 162 mg/dL — ABNORMAL HIGH (ref 70–99)

## 2025-01-12 LAB — URINE CULTURE: Culture: >=100000 — AB

## 2025-01-12 MED ORDER — CIPROFLOXACIN HCL 500 MG PO TABS
250.0000 mg | ORAL_TABLET | Freq: Two times a day (BID) | ORAL | Status: DC
  Administered 2025-01-13 – 2025-01-14 (×3): 250 mg via ORAL
  Filled 2025-01-12 (×3): qty 1

## 2025-01-12 NOTE — Progress Notes (Addendum)
 Mobility Specialist - Progress Note:   01/12/25 1437  Mobility  Activity  (Chair Exercises)  Level of Assistance Contact guard assist, steadying assist  Assistive Device None;Front wheel walker  Range of Motion/Exercises Active  Activity Response Tolerated well  Mobility Referral Yes  Mobility visit 1 Mobility  Mobility Specialist Start Time (ACUTE ONLY) 1215  Mobility Specialist Stop Time (ACUTE ONLY) 1230  Mobility Specialist Time Calculation (min) (ACUTE ONLY) 15 min   Pt was received in recliner and agreed to exercises: Seated BLE Exercises: 10 reps each  1) Ankle Pumps  2) Knee Extension  3) Marching    4) Hip Adduction (pillow squeezes)   5) (Standing): x2 Sit to stands - 10 second hold  Pt stated slight pain in RLE during session but manageable. Returned to recliner with all needs met. Call bell in reach.  [employer] - Mobility Specialist - Acute Rehabilitation Can be reached via [employer]
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
## 2025-01-12 NOTE — Plan of Care (Signed)
" °  Problem: Fluid Volume: Goal: Ability to maintain a balanced intake and output will improve Outcome: Progressing   Problem: Metabolic: Goal: Ability to maintain appropriate glucose levels will improve Outcome: Progressing   Problem: Nutritional: Goal: Maintenance of adequate nutrition will improve Outcome: Progressing   Problem: Skin Integrity: Goal: Risk for impaired skin integrity will decrease Outcome: Progressing   Problem: Coping: Goal: Level of anxiety will decrease Outcome: Progressing   Problem: Elimination: Goal: Will not experience complications related to bowel motility Outcome: Progressing Goal: Will not experience complications related to urinary retention Outcome: Progressing   Problem: Pain Managment: Goal: General experience of comfort will improve and/or be controlled Outcome: Progressing   Problem: Safety: Goal: Ability to remain free from injury will improve Outcome: Progressing   "

## 2025-01-13 DIAGNOSIS — M533 Sacrococcygeal disorders, not elsewhere classified: Secondary | ICD-10-CM | POA: Diagnosis not present

## 2025-01-13 LAB — GLUCOSE, CAPILLARY
Glucose-Capillary: 115 mg/dL — ABNORMAL HIGH (ref 70–99)
Glucose-Capillary: 106 mg/dL — ABNORMAL HIGH (ref 70–99)
Glucose-Capillary: 154 mg/dL — ABNORMAL HIGH (ref 70–99)
Glucose-Capillary: 164 mg/dL — ABNORMAL HIGH (ref 70–99)
Glucose-Capillary: 148 mg/dL — ABNORMAL HIGH (ref 70–99)

## 2025-01-13 LAB — COMPREHENSIVE METABOLIC PANEL WITH GFR
ALT: 8 U/L (ref 0–44)
AST: 19 U/L (ref 15–41)
Albumin: 3.3 g/dL — ABNORMAL LOW (ref 3.5–5.0)
Alkaline Phosphatase: 81 U/L (ref 38–126)
Anion gap: 11 (ref 5–15)
BUN: 27 mg/dL — ABNORMAL HIGH (ref 8–23)
CO2: 17 mmol/L — ABNORMAL LOW (ref 22–32)
Calcium: 9.7 mg/dL (ref 8.9–10.3)
Chloride: 107 mmol/L (ref 98–111)
Creatinine, Ser: 0.93 mg/dL (ref 0.44–1.00)
GFR, Estimated: >60 mL/min
Glucose, Bld: 114 mg/dL — ABNORMAL HIGH (ref 70–99)
Potassium: 4.2 mmol/L (ref 3.5–5.1)
Sodium: 136 mmol/L (ref 135–145)
Total Bilirubin: 0.3 mg/dL (ref 0.0–1.2)
Total Protein: 6.8 g/dL (ref 6.5–8.1)

## 2025-01-13 LAB — CBC
HCT: 34.3 % — ABNORMAL LOW (ref 36.0–46.0)
Hemoglobin: 11.7 g/dL — ABNORMAL LOW (ref 12.0–15.0)
MCH: 30.2 pg (ref 26.0–34.0)
MCHC: 34.1 g/dL (ref 30.0–36.0)
MCV: 88.6 fL (ref 80.0–100.0)
Platelets: 207 10*3/uL (ref 150–400)
RBC: 3.87 MIL/uL (ref 3.87–5.11)
RDW: 13.2 % (ref 11.5–15.5)
WBC: 7.4 10*3/uL (ref 4.0–10.5)
nRBC: 0 % (ref 0.0–0.2)

## 2025-01-13 NOTE — Progress Notes (Signed)
 " PROGRESS NOTE    Rodnisha C Haskin  FMW:[id number] DOB: [birth year] DOA: 01/08/2025 PCP: Leontine Cramp, NP  Brief Narrative: 84-year-old female with type 2 diabetes CKD stage IIIa diastolic heart failure hyperlipidemia hypothyroidism admitted after mechanical fall where she was leaning on her motorized scooter and fell on her right side.  She lives alone and was unable to ambulate in the ED and admitted to observation.  She did not lose consciousness or hit her head she is on aspirin  daily.  She accidentally pressed the gas instead of the brake while operating her mobile scooter inside the home causing her to fall forward and land on her right hip.  Assessment & Plan:   Principal Problem:   Sacral pain Active Problems:   Hyperlipidemia associated with type 2 diabetes mellitus (HCC)   Hypertension associated with diabetes (HCC)   CKD (chronic kidney disease) stage 3, GFR 30-59 ml/min (HCC)   BMI 39.0-39.9,adult   Obstructive sleep apnea   Hypothyroidism   DM2 (diabetes mellitus, type 2) (HCC)   Iliac crest bone pain   T11 vertebral fracture (HCC)   #1 status post mechanical fall with T11 fracture subacute to chronic with intractable sacral pain admitted for pain control PT and OT.  Seen by PT recommending SNF to obtain prior level of functioning.  Consult TOC for SNF. CT lumbar spine fracture involving T11 would be subacute or chronic in nature advanced DJD of the lumbar spine and multilevel canal stenosis and foraminal narrowing. Left shoulder Severe glenohumeral joint space narrowing with marginal osteophytes.  Mild degenerative changes of the acromioclavicular joint. Right shoulder-Moderate to severe glenohumeral osteoarthritis with moderate inferior humeral head-neck junction and mild posterior glenoid degenerative osteophytes.  Continue pain control with Tylenol  oxycodone lidocaine  patch and Flexeril .    #2 type 2 diabetes with hyperglycemia uncontrolled A1c 9.1 from this admission  continue Semglee  and sliding scale insulin  CBG (last 3)  Recent Labs    01/12/25 2357 01/13/25 0413 01/13/25 0807  GLUCAP 139* 115* 106*    #3 CKD stage IIIa avoid nephrotoxins monitor closely  #4 hypertension continue Norvasc  stable  #5 diastolic heart failure stable Not on diuresis  #6 hypothyroidism continue Synthroid   #7 hyperlipidemia continue Pravachol   #8 UTI UA consistent with uti urine culture showing E. coli sensitivity pending continue Rocephin .  #9 constipation -resolved with MiraLAX  and Dulcolax suppository   Estimated body mass index is 33.97 kg/m as calculated from the following:   Height as of this encounter: 5' (1.524 m).   Weight as of this encounter: 78.9 kg.  DVT prophylaxis: Lovenox   code Status: Full full  family Communication: None none  disposition Plan:  Status is: Observation   Consultants:  None  Procedures: None  Antimicrobials: Rocephin   Subjective:  Constipation is resolved pain is controlled with current medications  Waiting for SNF bed  objective: Vitals:   01/12/25 1737 01/12/25 2031 01/13/25 0444 01/13/25 1010  BP: (!) 151/56 (!) 146/67 (!) 145/59 (!) 140/62  Pulse: (!) 57 (!) 59 (!) 55 (!) 58  Resp: 16  20 14   Temp:  98 F (36.7 C) 97.8 F (36.6 C) 98.5 F (36.9 C)  TempSrc:   Oral   SpO2:  93% 95% 93%  Weight:      Height:        Intake/Output Summary (Last 24 hours) at 01/13/2025 1039 Last data filed at 01/13/2025 0855 Gross per 24 hour  Intake 790 ml  Output 2885 ml  Net -2095 ml  Filed Weights   01/09/25 0306  Weight: 78.9 kg    Examination:  General exam: Appears in mild distress due to pain  respiratory system: Clear to auscultation. Respiratory effort normal. Cardiovascular system: reg trace bilateral edema Gastrointestinal system: Abdomen is nondistended, soft and nontender. No organomegaly or masses felt. Normal bowel sounds heard. Central nervous system: Alert and oriented. No focal  neurological deficits. Extremities: Trace bilateral edema  Data Reviewed: I have personally reviewed following labs and imaging studies  CBC: Recent Labs  Lab 01/08/25 1838 01/08/25 1844 01/09/25 0544 01/13/25 0548  WBC  --  12.7* 10.2 7.4  NEUTROABS  --  9.1*  --   --   HGB 12.2 12.0 11.2* 11.7*  HCT 36.0 35.6* 34.5* 34.3*  MCV  --  89.9 91.0 88.6  PLT  --  217 199 207   Basic Metabolic Panel: Recent Labs  Lab 01/08/25 1838 01/08/25 1844 01/08/25 2121 01/09/25 0544 01/13/25 0548  NA 139 138  --  140 136  K 4.2 4.3  --  4.5 4.2  CL 107 107  --  110 107  CO2  --  20*  --  22 17*  GLUCOSE 169* 175*  --  110* 114*  BUN 40* 36*  --  29* 27*  CREATININE 1.20* 1.09*  --  0.98 0.93  CALCIUM  --  9.6  --  9.4 9.7  MG  --   --  2.1 2.2  --   PHOS  --   --  4.0 4.2  --    GFR: Estimated Creatinine Clearance: 41.1 mL/min (by C-G formula based on SCr of 0.93 mg/dL). Liver Function Tests: Recent Labs  Lab 01/08/25 1844 01/09/25 0544 01/13/25 0548  AST 20 17 19   ALT 14 11 8   ALKPHOS 77 68 81  BILITOT 0.3 0.2 0.3  PROT 7.2 6.6 6.8  ALBUMIN 3.9 3.5 3.3*   No results for input(s): LIPASE, AMYLASE in the last 168 hours. No results for input(s): AMMONIA in the last 168 hours. Coagulation Profile: No results for input(s): INR, PROTIME in the last 168 hours. Cardiac Enzymes: No results for input(s): CKTOTAL, CKMB, CKMBINDEX, TROPONINI in the last 168 hours. BNP (last 3 results) No results for input(s): PROBNP in the last 8760 hours. HbA1C: No results for input(s): HGBA1C in the last 72 hours.  CBG: Recent Labs  Lab 01/12/25 1604 01/12/25 2132 01/12/25 2357 01/13/25 0413 01/13/25 0807  GLUCAP 150* 139* 139* 115* 106*   Lipid Profile: No results for input(s): CHOL, HDL, LDLCALC, TRIG, CHOLHDL, LDLDIRECT in the last 72 hours. Thyroid  Function Tests: No results for input(s): TSH, T4TOTAL, FREET4, T3FREE, THYROIDAB in the  last 72 hours. Anemia Panel: No results for input(s): VITAMINB12, FOLATE, FERRITIN, TIBC, IRON, RETICCTPCT in the last 72 hours. Sepsis Labs: No results for input(s): PROCALCITON, LATICACIDVEN in the last 168 hours.  Recent Results (from the past 240 hours)  Urine Culture (for pregnant, neutropenic or urologic patients or patients with an indwelling urinary catheter)     Status: Abnormal   Collection Time: 01/08/25  9:08 PM   Specimen: Urine, Clean Catch  Result Value Ref Range Status   Specimen Description   Final    URINE, CLEAN CATCH Performed at [facility], [address], [place], [place] [address]    Special Requests   Final    NONE Performed at [facility], [address] [place], [place] [address]    Culture >=100,000 COLONIES/mL ESCHERICHIA  COLI (A)  Final   Report Status 01/12/2025 FINAL  Final   Organism ID, Bacteria ESCHERICHIA COLI (A)  Final      Susceptibility   Escherichia coli - MIC*    AMPICILLIN  >=32 RESISTANT Resistant     CEFEPIME <=0.12 SENSITIVE Sensitive     ERTAPENEM <=0.12 SENSITIVE Sensitive     CEFTRIAXONE  1 SENSITIVE Sensitive     CIPROFLOXACIN  <=0.06 SENSITIVE Sensitive     GENTAMICIN <=1 SENSITIVE Sensitive     NITROFURANTOIN 32 SENSITIVE Sensitive     TRIMETH /SULFA  <=20 SENSITIVE Sensitive     AMPICILLIN /SULBACTAM >=32 RESISTANT Resistant     PIP/TAZO Value in next row Intermediate      64 INTERMEDIATEThis is a modified FDA-approved test that has been validated and its performance characteristics determined by the reporting laboratory.  This laboratory is certified under the Clinical Laboratory Improvement Amendments CLIA as qualified to perform high complexity clinical laboratory testing.    MEROPENEM Value in next row Sensitive      64 INTERMEDIATEThis is a modified FDA-approved test that has been validated and its performance characteristics determined by the reporting laboratory.  This  laboratory is certified under the Clinical Laboratory Improvement Amendments CLIA as qualified to perform high complexity clinical laboratory testing.    * >=100,000 COLONIES/mL ESCHERICHIA COLI         Radiology Studies: DG Shoulder Left Result Date: 01/12/2025 EXAM: 1 VIEW(S) XRAY OF THE LEFT SHOULDER 01/12/2025 11:00:00 AM COMPARISON: 03/07/2024 CLINICAL HISTORY: Pain. FINDINGS: BONES AND JOINTS: Severe glenohumeral joint space narrowing with marginal osteophytes. No acute fracture. No malalignment. Mild degenerative changes of the acromioclavicular joint. SOFT TISSUES: No abnormal calcifications. Visualized lung is unremarkable. IMPRESSION: 1. Severe glenohumeral joint space narrowing with marginal osteophytes. 2. Mild degenerative changes of the acromioclavicular joint. Electronically signed by: Oneil Devonshire MD 01/12/2025 05:20 PM EST RP Workstation: [id number]   DG Shoulder Right Result Date: 01/12/2025 EXAM: 1 VIEW(S) XRAY OF THE SHOULDER 01/12/2025 11:01:00 AM COMPARISON: None available. CLINICAL HISTORY: Pain. FINDINGS: BONES AND JOINTS: Moderate to severe glenohumeral joint space narrowing. Moderate inferior humeral head-neck junction degenerative osteophytes. Mild posterior glenoid degenerative osteophytes. No acute fracture. No malalignment. The [facility] joint is unremarkable. Moderate multilevel degenerative disc and endplate changes of the thoracic spine. SOFT TISSUES: Moderate atherosclerotic calcifications within the aortic arch. Visualized lung is unremarkable. IMPRESSION: 1. Moderate to severe glenohumeral osteoarthritis with moderate inferior humeral head-neck junction and mild posterior glenoid degenerative osteophytes. Electronically signed by: Elsie Gravely MD 01/12/2025 05:16 PM EST RP Workstation: [id number]     Scheduled Meds:  acetaminophen   1,000 mg Oral TID   allopurinol   100 mg Oral Daily   amLODipine   10 mg Oral Daily   busPIRone   15 mg Oral BID   ciprofloxacin   250 mg  Oral BID   Influenza vac split trivalent PF  0.5 mL Intramuscular Tomorrow-1000   insulin  aspart  0-9 Units Subcutaneous Q4H   insulin  glargine-yfgn  30 Units Subcutaneous QHS   lidocaine   3 patch Transdermal Q24H   pantoprazole   40 mg Oral BID   polyethylene glycol  17 g Oral BID   pravastatin   40 mg Oral Daily   sertraline   50 mg Oral Daily   Continuous Infusions:     LOS: 0 days    Almarie KANDICE Hoots, MD 01/13/2025, 10:39 AM   "

## 2025-01-13 NOTE — Plan of Care (Signed)

## 2025-01-14 DIAGNOSIS — M533 Sacrococcygeal disorders, not elsewhere classified: Secondary | ICD-10-CM | POA: Diagnosis not present

## 2025-01-14 LAB — GLUCOSE, CAPILLARY
Glucose-Capillary: 108 mg/dL — ABNORMAL HIGH (ref 70–99)
Glucose-Capillary: 111 mg/dL — ABNORMAL HIGH (ref 70–99)
Glucose-Capillary: 112 mg/dL — ABNORMAL HIGH (ref 70–99)
Glucose-Capillary: 134 mg/dL — ABNORMAL HIGH (ref 70–99)
Glucose-Capillary: 158 mg/dL — ABNORMAL HIGH (ref 70–99)
Glucose-Capillary: 95 mg/dL (ref 70–99)

## 2025-01-14 MED ORDER — INSULIN ASPART 100 UNIT/ML IJ SOLN
0.0000 [IU] | Freq: Three times a day (TID) | INTRAMUSCULAR | Status: DC
  Administered 2025-01-15: 2 [IU] via SUBCUTANEOUS
  Administered 2025-01-15 (×2): 1 [IU] via SUBCUTANEOUS
  Administered 2025-01-15: 2 [IU] via SUBCUTANEOUS
  Administered 2025-01-16: 3 [IU] via SUBCUTANEOUS
  Administered 2025-01-16 (×2): 2 [IU] via SUBCUTANEOUS
  Administered 2025-01-16 – 2025-01-17 (×2): 1 [IU] via SUBCUTANEOUS
  Administered 2025-01-17: 3 [IU] via SUBCUTANEOUS
  Administered 2025-01-17: 1 [IU] via SUBCUTANEOUS
  Administered 2025-01-18 (×2): 2 [IU] via SUBCUTANEOUS
  Administered 2025-01-18: 1 [IU] via SUBCUTANEOUS
  Filled 2025-01-14: qty 1
  Filled 2025-01-14 (×3): qty 2
  Filled 2025-01-14: qty 1
  Filled 2025-01-14: qty 2
  Filled 2025-01-14 (×2): qty 1
  Filled 2025-01-14 (×2): qty 2
  Filled 2025-01-14: qty 3
  Filled 2025-01-14: qty 1
  Filled 2025-01-14: qty 2
  Filled 2025-01-14: qty 1

## 2025-01-14 NOTE — Plan of Care (Signed)

## 2025-01-14 NOTE — Progress Notes (Signed)
 Physical Therapy Treatment Patient Details Name: Rebecca Walter MRN: [id number] DOB: 07-06-1941 Today's Date: 01/14/2025   History of Present Illness 84 y.o. female admitted with fall from scooter that knocked her down while trying to plug in to charge on 01/08/25. Imaging showed subacute/chronic T11 fracture. Dx of UTI.   Pt with medical history significant of CKD, DM2, HTN, diastolic CHF, hypothyrodism, HLD, kidney stones.    PT Comments  Pt very cooperative but progressing slowly with mobility and continues to fatigue easily with activity.  Patient will benefit from continued inpatient follow up therapy, <3 hours/day to maximize IND and safety prior to return home with limited assist.     If plan is discharge home, recommend the following: A lot of help with walking and/or transfers;A lot of help with bathing/dressing/bathroom;Assistance with cooking/housework;Assist for transportation;Help with stairs or ramp for entrance   Can travel by private vehicle     No  Equipment Recommendations  None recommended by PT    Recommendations for Other Services       Precautions / Restrictions Precautions Precautions: Fall Recall of Precautions/Restrictions: Intact Precaution/Restrictions Comments: pt denies h/o falls in past 6 months other than the one just prior to this admission Restrictions Weight Bearing Restrictions Per Provider Order: No     Mobility  Bed Mobility               General bed mobility comments: Pt up in chair and returns to same    Transfers Overall transfer level: Needs assistance Equipment used: Rolling walker (2 wheels) Transfers: Sit to/from Stand Sit to Stand: Min assist           General transfer comment: Increased time, cues to scoot forward, use of momentum and min A to stand    Ambulation/Gait Ambulation/Gait assistance: Min assist Gait Distance (Feet): 13 Feet Assistive device: Rolling walker (2 wheels) Gait Pattern/deviations: Step-to  pattern, Decreased stride length, Shuffle, Trunk flexed Gait velocity: decreased     General Gait Details: Assist for balance and turning RW   Stairs             Wheelchair Mobility     Tilt Bed    Modified Rankin (Stroke Patients Only)       Balance Overall balance assessment: Needs assistance Sitting-balance support: Feet supported, No upper extremity supported Sitting balance-Leahy Scale: Good     Standing balance support: Bilateral upper extremity supported, Reliant on assistive device for balance Standing balance-Leahy Scale: Poor Standing balance comment: Min assist with RW                            Communication Communication Communication: Impaired Factors Affecting Communication: Hearing impaired  Cognition Arousal: Alert Behavior During Therapy: WFL for tasks assessed/performed   PT - Cognitive impairments: No apparent impairments                         Following commands: Intact      Cueing Cueing Techniques: Verbal cues  Exercises General Exercises - Lower Extremity Ankle Circles/Pumps: AROM, Both, 15 reps, Supine Long Arc Quad: AROM, Both, 15 reps, Seated Hip Flexion/Marching: AROM, Both, 10 reps, Supine    General Comments        Pertinent Vitals/Pain Pain Assessment Pain Assessment: 0-10 Pain Score: 3  Pain Location: right shoulder (chronic), back Pain Descriptors / Indicators: Aching, Sore, Constant, Discomfort, Guarding Pain Intervention(s): Limited activity within patient's tolerance, Monitored  during session, Premedicated before session    Home Living                          Prior Function            PT Goals (current goals can now be found in the care plan section) Acute Rehab PT Goals Patient Stated Goal: to get stronger PT Goal Formulation: With patient/family Time For Goal Achievement: 01/23/25 Potential to Achieve Goals: Good Progress towards PT goals: Progressing toward  goals    Frequency    Min 2X/week      PT Plan      Co-evaluation              AM-PAC PT 6 Clicks Mobility   Outcome Measure  Help needed turning from your back to your side while in a flat bed without using bedrails?: A Little Help needed moving from lying on your back to sitting on the side of a flat bed without using bedrails?: A Lot Help needed moving to and from a bed to a chair (including a wheelchair)?: A Little Help needed standing up from a chair using your arms (e.g., wheelchair or bedside chair)?: A Little Help needed to walk in hospital room?: A Lot Help needed climbing 3-5 steps with a railing? : Total 6 Click Score: 14    End of Session Equipment Utilized During Treatment: Gait belt Activity Tolerance: Patient tolerated treatment well;Patient limited by fatigue;Patient limited by pain Patient left: in chair;with chair alarm set;with call bell/phone within reach Nurse Communication: Mobility status PT Visit Diagnosis: Difficulty in walking, not elsewhere classified (R26.2)     Time: 1020-1047 PT Time Calculation (min) (ACUTE ONLY): 27 min  Charges:    $Gait Training: 8-22 mins $Therapeutic Exercise: 8-22 mins PT General Charges $$ ACUTE PT VISIT: 1 Visit                     [facility] PT Acute Rehabilitation Services Office [phone]    Lokelani Lutes 01/14/2025, 1:08 PM

## 2025-01-14 NOTE — Progress Notes (Signed)
 " PROGRESS NOTE    Rebecca Walter  FMW:[id number] DOB: 09-03-1941 DOA: 01/08/2025 PCP: Leontine Cramp, NP  Brief Narrative: 84-year-old female with type 2 diabetes CKD stage IIIa diastolic heart failure hyperlipidemia hypothyroidism admitted after mechanical fall where she was leaning on her motorized scooter and fell on her right side.  She lives alone and was unable to ambulate in the ED and admitted to observation.  She did not lose consciousness or hit her head she is on aspirin  daily.  She accidentally pressed the gas instead of the brake while operating her mobile scooter inside the home causing her to fall forward and land on her right hip.  Assessment & Plan:   Principal Problem:   Sacral pain Active Problems:   Hyperlipidemia associated with type 2 diabetes mellitus (HCC)   Hypertension associated with diabetes (HCC)   CKD (chronic kidney disease) stage 3, GFR 30-59 ml/min (HCC)   BMI 39.0-39.9,adult   Obstructive sleep apnea   Hypothyroidism   DM2 (diabetes mellitus, type 2) (HCC)   Iliac crest bone pain   T11 vertebral fracture (HCC)   #1 status post mechanical fall with T11 fracture subacute to chronic with intractable sacral pain admitted for pain control PT and OT.  Seen by PT recommending SNF to obtain prior level of functioning.  Consult TOC for SNF. CT lumbar spine fracture involving T11 would be subacute or chronic in nature advanced DJD of the lumbar spine and multilevel canal stenosis and foraminal narrowing. Left shoulder Severe glenohumeral joint space narrowing with marginal osteophytes.  Mild degenerative changes of the acromioclavicular joint. Right shoulder-Moderate to severe glenohumeral osteoarthritis with moderate inferior humeral head-neck junction and mild posterior glenoid degenerative osteophytes.  Continue pain control with Tylenol  oxycodone lidocaine  patch and Flexeril .    #2 type 2 diabetes with hyperglycemia uncontrolled A1c 9.1 from this admission  continue Semglee  and sliding scale insulin  CBG (last 3)  Recent Labs    01/14/25 0046 01/14/25 0339 01/14/25 0731  GLUCAP 111* 112* 95    #3 CKD stage IIIa avoid nephrotoxins monitor closely  #4 hypertension continue Norvasc  stable  #5 diastolic heart failure stable Not on diuresis  #6 hypothyroidism continue Synthroid   #7 hyperlipidemia continue Pravachol   #8 UTI UA consistent with uti urine culture showing E. coli sensitivity pending continue Rocephin .  #9 constipation -resolved with MiraLAX  and Dulcolax suppository   Estimated body mass index is 33.97 kg/m as calculated from the following:   Height as of this encounter: 5' (1.524 m).   Weight as of this encounter: 78.9 kg.  DVT prophylaxis: Lovenox   code Status: Full full  family Communication: None  disposition Plan:  Status is: Observation   Consultants:  None  Procedures: None  Antimicrobials: Rocephin   Subjective:  Having bm ok  Pain controlled with meds  objective: Vitals:   01/13/25 0444 01/13/25 1010 01/13/25 1159 01/14/25 0913  BP: (!) 145/59 (!) 140/62 (!) 148/64 (!) 148/64  Pulse: (!) 55 (!) 58 (!) 56   Resp: 20 14 20    Temp: 97.8 F (36.6 C) 98.5 F (36.9 C) 98 F (36.7 C)   TempSrc: Oral  Oral   SpO2: 95% 93% 97%   Weight:      Height:        Intake/Output Summary (Last 24 hours) at 01/14/2025 1016 Last data filed at 01/14/2025 0924 Gross per 24 hour  Intake 856 ml  Output 1400 ml  Net -544 ml    Filed Weights   01/09/25 0306  Weight: 78.9 kg    Examination:  General exam: Appears in mild distress due to pain  respiratory system: Clear to auscultation. Respiratory effort normal. Cardiovascular system: reg trace bilateral edema Gastrointestinal system: Abdomen is nondistended, soft and nontender. No organomegaly or masses felt. Normal bowel sounds heard. Central nervous system: Alert and oriented. No focal neurological deficits. Extremities: Trace bilateral edema  Data  Reviewed: I have personally reviewed following labs and imaging studies  CBC: Recent Labs  Lab 01/08/25 1838 01/08/25 1844 01/09/25 0544 01/13/25 0548  WBC  --  12.7* 10.2 7.4  NEUTROABS  --  9.1*  --   --   HGB 12.2 12.0 11.2* 11.7*  HCT 36.0 35.6* 34.5* 34.3*  MCV  --  89.9 91.0 88.6  PLT  --  217 199 207   Basic Metabolic Panel: Recent Labs  Lab 01/08/25 1838 01/08/25 1844 01/08/25 2121 01/09/25 0544 01/13/25 0548  NA 139 138  --  140 136  K 4.2 4.3  --  4.5 4.2  CL 107 107  --  110 107  CO2  --  20*  --  22 17*  GLUCOSE 169* 175*  --  110* 114*  BUN 40* 36*  --  29* 27*  CREATININE 1.20* 1.09*  --  0.98 0.93  CALCIUM  --  9.6  --  9.4 9.7  MG  --   --  2.1 2.2  --   PHOS  --   --  4.0 4.2  --    GFR: Estimated Creatinine Clearance: 41.1 mL/min (by C-G formula based on SCr of 0.93 mg/dL). Liver Function Tests: Recent Labs  Lab 01/08/25 1844 01/09/25 0544 01/13/25 0548  AST 20 17 19   ALT 14 11 8   ALKPHOS 77 68 81  BILITOT 0.3 0.2 0.3  PROT 7.2 6.6 6.8  ALBUMIN 3.9 3.5 3.3*   No results for input(s): LIPASE, AMYLASE in the last 168 hours. No results for input(s): AMMONIA in the last 168 hours. Coagulation Profile: No results for input(s): INR, PROTIME in the last 168 hours. Cardiac Enzymes: No results for input(s): CKTOTAL, CKMB, CKMBINDEX, TROPONINI in the last 168 hours. BNP (last 3 results) No results for input(s): PROBNP in the last 8760 hours. HbA1C: No results for input(s): HGBA1C in the last 72 hours.  CBG: Recent Labs  Lab 01/13/25 1613 01/13/25 2121 01/14/25 0046 01/14/25 0339 01/14/25 0731  GLUCAP 164* 148* 111* 112* 95   Lipid Profile: No results for input(s): CHOL, HDL, LDLCALC, TRIG, CHOLHDL, LDLDIRECT in the last 72 hours. Thyroid  Function Tests: No results for input(s): TSH, T4TOTAL, FREET4, T3FREE, THYROIDAB in the last 72 hours. Anemia Panel: No results for input(s): VITAMINB12,  FOLATE, FERRITIN, TIBC, IRON, RETICCTPCT in the last 72 hours. Sepsis Labs: No results for input(s): PROCALCITON, LATICACIDVEN in the last 168 hours.  Recent Results (from the past 240 hours)  Urine Culture (for pregnant, neutropenic or urologic patients or patients with an indwelling urinary catheter)     Status: Abnormal   Collection Time: 01/08/25  9:08 PM   Specimen: Urine, Clean Catch  Result Value Ref Range Status   Specimen Description   Final    URINE, CLEAN CATCH Performed at [facility], [address] [place], [place] [address]    Special Requests   Final    NONE Performed at [facility], [address], [place], [place] [address]    Culture >=100,000 COLONIES/mL ESCHERICHIA COLI (A)  Final   Report  Status 01/12/2025 FINAL  Final   Organism ID, Bacteria ESCHERICHIA COLI (A)  Final      Susceptibility   Escherichia coli - MIC*    AMPICILLIN  >=32 RESISTANT Resistant     CEFEPIME <=0.12 SENSITIVE Sensitive     ERTAPENEM <=0.12 SENSITIVE Sensitive     CEFTRIAXONE  1 SENSITIVE Sensitive     CIPROFLOXACIN  <=0.06 SENSITIVE Sensitive     GENTAMICIN <=1 SENSITIVE Sensitive     NITROFURANTOIN 32 SENSITIVE Sensitive     TRIMETH /SULFA  <=20 SENSITIVE Sensitive     AMPICILLIN /SULBACTAM >=32 RESISTANT Resistant     PIP/TAZO Value in next row Intermediate      64 INTERMEDIATEThis is a modified FDA-approved test that has been validated and its performance characteristics determined by the reporting laboratory.  This laboratory is certified under the Clinical Laboratory Improvement Amendments CLIA as qualified to perform high complexity clinical laboratory testing.    MEROPENEM Value in next row Sensitive      64 INTERMEDIATEThis is a modified FDA-approved test that has been validated and its performance characteristics determined by the reporting laboratory.  This laboratory is certified under the Clinical Laboratory Improvement  Amendments CLIA as qualified to perform high complexity clinical laboratory testing.    * >=100,000 COLONIES/mL ESCHERICHIA COLI         Radiology Studies: DG Shoulder Left Result Date: 01/12/2025 EXAM: 1 VIEW(S) XRAY OF THE LEFT SHOULDER 01/12/2025 11:00:00 AM COMPARISON: 03/07/2024 CLINICAL HISTORY: Pain. FINDINGS: BONES AND JOINTS: Severe glenohumeral joint space narrowing with marginal osteophytes. No acute fracture. No malalignment. Mild degenerative changes of the acromioclavicular joint. SOFT TISSUES: No abnormal calcifications. Visualized lung is unremarkable. IMPRESSION: 1. Severe glenohumeral joint space narrowing with marginal osteophytes. 2. Mild degenerative changes of the acromioclavicular joint. Electronically signed by: Oneil Devonshire MD 01/12/2025 05:20 PM EST RP Workstation: [id number]   DG Shoulder Right Result Date: 01/12/2025 EXAM: 1 VIEW(S) XRAY OF THE SHOULDER 01/12/2025 11:01:00 AM COMPARISON: None available. CLINICAL HISTORY: Pain. FINDINGS: BONES AND JOINTS: Moderate to severe glenohumeral joint space narrowing. Moderate inferior humeral head-neck junction degenerative osteophytes. Mild posterior glenoid degenerative osteophytes. No acute fracture. No malalignment. The [facility] joint is unremarkable. Moderate multilevel degenerative disc and endplate changes of the thoracic spine. SOFT TISSUES: Moderate atherosclerotic calcifications within the aortic arch. Visualized lung is unremarkable. IMPRESSION: 1. Moderate to severe glenohumeral osteoarthritis with moderate inferior humeral head-neck junction and mild posterior glenoid degenerative osteophytes. Electronically signed by: Elsie Gravely MD 01/12/2025 05:16 PM EST RP Workstation: [id number]    Scheduled Meds:  acetaminophen   1,000 mg Oral TID   allopurinol   100 mg Oral Daily   amLODipine   10 mg Oral Daily   busPIRone   15 mg Oral BID   ciprofloxacin   250 mg Oral BID   Influenza vac split trivalent PF  0.5 mL Intramuscular  Tomorrow-1000   insulin  aspart  0-9 Units Subcutaneous Q4H   insulin  glargine-yfgn  30 Units Subcutaneous QHS   lidocaine   3 patch Transdermal Q24H   pantoprazole   40 mg Oral BID   polyethylene glycol  17 g Oral BID   pravastatin   40 mg Oral Daily   sertraline   50 mg Oral Daily   Continuous Infusions:   LOS: 0 days    Almarie KANDICE Hoots, MD 01/14/2025, 10:16 AM   "

## 2025-01-14 NOTE — Plan of Care (Signed)
" °  Problem: Coping: Goal: Ability to adjust to condition or change in health will improve Outcome: Progressing   Problem: Nutritional: Goal: Maintenance of adequate nutrition will improve Outcome: Progressing   Problem: Skin Integrity: Goal: Risk for impaired skin integrity will decrease Outcome: Progressing   Problem: Activity: Goal: Risk for activity intolerance will decrease Outcome: Progressing   Problem: Nutrition: Goal: Adequate nutrition will be maintained Outcome: Progressing   Problem: Coping: Goal: Level of anxiety will decrease Outcome: Progressing   Problem: Elimination: Goal: Will not experience complications related to bowel motility Outcome: Progressing   Problem: Pain Managment: Goal: General experience of comfort will improve and/or be controlled Outcome: Progressing   Problem: Safety: Goal: Ability to remain free from injury will improve Outcome: Progressing   "

## 2025-01-14 NOTE — TOC Progression Note (Addendum)
 Transition of Care [facility]) - Progression Note    Patient Details  Name: Rebecca Walter MRN: [id number] Date of Birth: 1941/11/19  Transition of Care [facility]) CM/SW Contact  Doneta Glenys DASEN, RN Phone Number: 01/14/2025, 10:45 AM  Clinical Narrative:   12:25 PM CM called [employer], was instructed to run an eligibility. Than completed the auth.  Transaction ID: [id number] Customer ID: [id number] Transaction Date: 2025-01-14 10:45 AM CM submitted Level 2 PASRR documents - pending [place] Must ID [id number].   Expected Discharge Plan: Skilled Nursing Facility Barriers to Discharge: Continued Medical Work up               Expected Discharge Plan and Services   Discharge Planning Services: CM Consult   Living arrangements for the past 2 months: Apartment                 DME Arranged: N/A DME Agency: NA       HH Arranged: NA HH Agency: NA         Social Drivers of Health (SDOH) Interventions SDOH Screenings   Food Insecurity: No Food Insecurity (01/10/2025)  Housing: Low Risk (01/10/2025)  Transportation Needs: No Transportation Needs (01/10/2025)  Utilities: Not At Risk (01/10/2025)  Alcohol  Screen: Low Risk (05/08/2023)  Depression (PHQ2-9): Low Risk (05/23/2023)  Financial Resource Strain: Low Risk (05/08/2023)  Physical Activity: Inactive (05/08/2023)  Social Connections: Moderately Integrated (01/09/2025)  Stress: No Stress Concern Present (05/08/2023)  Tobacco Use: Low Risk (01/08/2025)    Readmission Risk Interventions     No data to display

## 2025-01-15 DIAGNOSIS — M533 Sacrococcygeal disorders, not elsewhere classified: Secondary | ICD-10-CM | POA: Diagnosis not present

## 2025-01-15 LAB — GLUCOSE, CAPILLARY
Glucose-Capillary: 149 mg/dL — ABNORMAL HIGH (ref 70–99)
Glucose-Capillary: 151 mg/dL — ABNORMAL HIGH (ref 70–99)
Glucose-Capillary: 146 mg/dL — ABNORMAL HIGH (ref 70–99)
Glucose-Capillary: 151 mg/dL — ABNORMAL HIGH (ref 70–99)

## 2025-01-15 MED ORDER — BISACODYL 5 MG PO TBEC: 5.0000 mg | DELAYED_RELEASE_TABLET | Freq: Every day | ORAL | 0 refills | Status: AC | PRN

## 2025-01-15 MED ORDER — LIDOCAINE 5 % EX PTCH: 3.0000 | MEDICATED_PATCH | CUTANEOUS | 0 refills | Status: AC

## 2025-01-15 MED ORDER — HYDROCODONE-ACETAMINOPHEN 5-325 MG PO TABS: 1.0000 | ORAL_TABLET | ORAL | 0 refills | Status: DC | PRN

## 2025-01-15 MED ORDER — CYCLOBENZAPRINE HCL 5 MG PO TABS: 5.0000 mg | ORAL_TABLET | Freq: Three times a day (TID) | ORAL | 0 refills | Status: AC | PRN

## 2025-01-15 MED ORDER — ONDANSETRON HCL 4 MG PO TABS: 4.0000 mg | ORAL_TABLET | Freq: Four times a day (QID) | ORAL | 0 refills | Status: AC | PRN

## 2025-01-15 MED ORDER — ACETAMINOPHEN 500 MG PO TABS: 1000.0000 mg | ORAL_TABLET | Freq: Three times a day (TID) | ORAL | 0 refills | Status: AC

## 2025-01-15 NOTE — Plan of Care (Signed)
" °  Problem: Coping: Goal: Ability to adjust to condition or change in health will improve Outcome: Progressing   Problem: Skin Integrity: Goal: Risk for impaired skin integrity will decrease Outcome: Progressing   Problem: Activity: Goal: Risk for activity intolerance will decrease Outcome: Progressing   Problem: Nutrition: Goal: Adequate nutrition will be maintained Outcome: Progressing   Problem: Elimination: Goal: Will not experience complications related to bowel motility Outcome: Progressing   Problem: Pain Managment: Goal: General experience of comfort will improve and/or be controlled Outcome: Progressing   Problem: Safety: Goal: Ability to remain free from injury will improve Outcome: Progressing   "

## 2025-01-15 NOTE — Progress Notes (Signed)
 " PROGRESS NOTE    Rebecca Walter  FMW:[id number] DOB: [birth year] DOA: 01/08/2025 PCP: Leontine Cramp, NP  Brief Narrative: 84-year-old female with type 2 diabetes CKD stage IIIa diastolic heart failure hyperlipidemia hypothyroidism admitted after mechanical fall where she was leaning on her motorized scooter and fell on her right side.  She lives alone and was unable to ambulate in the ED and admitted to observation.  She did not lose consciousness or hit her head she is on aspirin  daily.  She accidentally pressed the gas instead of the brake while operating her mobile scooter inside the home causing her to fall forward and land on her right hip.  Assessment & Plan:   Principal Problem:   Sacral pain Active Problems:   Hyperlipidemia associated with type 2 diabetes mellitus (HCC)   Hypertension associated with diabetes (HCC)   CKD (chronic kidney disease) stage 3, GFR 30-59 ml/min (HCC)   BMI 39.0-39.9,adult   Obstructive sleep apnea   Hypothyroidism   DM2 (diabetes mellitus, type 2) (HCC)   Iliac crest bone pain   T11 vertebral fracture (HCC)   #1 status post mechanical fall with T11 fracture subacute to chronic with intractable sacral pain admitted for pain control PT and OT.  Seen by PT recommending SNF to obtain prior level of functioning. Insurance denied SNF 1/27. CT lumbar spine fracture involving T11 would be subacute or chronic in nature advanced DJD of the lumbar spine and multilevel canal stenosis and foraminal narrowing. Left shoulder Severe glenohumeral joint space narrowing with marginal osteophytes.  Mild degenerative changes of the acromioclavicular joint. Right shoulder-Moderate to severe glenohumeral osteoarthritis with moderate inferior humeral head-neck junction and mild posterior glenoid degenerative osteophytes.  Continue pain control with Tylenol  oxycodone lidocaine  patch and Flexeril .    #2 type 2 diabetes with hyperglycemia uncontrolled A1c 9.1 from this admission  continue Semglee  and sliding scale insulin  CBG (last 3)  Recent Labs    01/14/25 2101 01/15/25 0740 01/15/25 1117  GLUCAP 134* 149* 151*    #3 CKD stage IIIa avoid nephrotoxins monitor closely  #4 hypertension continue Norvasc  stable  #5 diastolic heart failure stable Takes prn lasix   #6 hypothyroidism continue Synthroid   #7 hyperlipidemia continue Pravachol   #8 UTI UA consistent with uti urine culture showing E. coli  Finished a course of Rocephin   #9 constipation -resolved with MiraLAX  and Dulcolax suppository   Estimated body mass index is 33.97 kg/m as calculated from the following:   Height as of this encounter: 5' (1.524 m).   Weight as of this encounter: 78.9 kg.  DVT prophylaxis: Lovenox   code Status: Full full  family Communication: None  disposition Plan:  Status is: Observation   Consultants:  None  Procedures: None  Antimicrobials: Rocephin   Subjective:  No new c/o  objective: Vitals:   01/14/25 2007 01/15/25 0518 01/15/25 0909 01/15/25 1118  BP: (!) 135/55 (!) 147/64 (!) 147/64 (!) 144/63  Pulse: (!) 57 (!) 54  (!) 55  Resp: 14 20  18   Temp: 98.3 F (36.8 C) 98.4 F (36.9 C)  98.4 F (36.9 C)  TempSrc: Oral   Oral  SpO2:  95%    Weight:      Height:        Intake/Output Summary (Last 24 hours) at 01/15/2025 1238 Last data filed at 01/15/2025 1023 Gross per 24 hour  Intake 440 ml  Output --  Net 440 ml    Filed Weights   01/09/25 0306  Weight: 78.9 kg  Examination:  General exam: Appears in mild distress due to pain  respiratory system: Clear to auscultation. Respiratory effort normal. Cardiovascular system: reg trace bilateral edema Gastrointestinal system: Abdomen is nondistended, soft and nontender. No organomegaly or masses felt. Normal bowel sounds heard. Central nervous system: Alert and oriented. No focal neurological deficits. Extremities: Trace bilateral edema  Data Reviewed: I have personally reviewed  following labs and imaging studies  CBC: Recent Labs  Lab 01/08/25 1838 01/08/25 1844 01/09/25 0544 01/13/25 0548  WBC  --  12.7* 10.2 7.4  NEUTROABS  --  9.1*  --   --   HGB 12.2 12.0 11.2* 11.7*  HCT 36.0 35.6* 34.5* 34.3*  MCV  --  89.9 91.0 88.6  PLT  --  217 199 207   Basic Metabolic Panel: Recent Labs  Lab 01/08/25 1838 01/08/25 1844 01/08/25 2121 01/09/25 0544 01/13/25 0548  NA 139 138  --  140 136  K 4.2 4.3  --  4.5 4.2  CL 107 107  --  110 107  CO2  --  20*  --  22 17*  GLUCOSE 169* 175*  --  110* 114*  BUN 40* 36*  --  29* 27*  CREATININE 1.20* 1.09*  --  0.98 0.93  CALCIUM  --  9.6  --  9.4 9.7  MG  --   --  2.1 2.2  --   PHOS  --   --  4.0 4.2  --    GFR: Estimated Creatinine Clearance: 41.1 mL/min (by C-G formula based on SCr of 0.93 mg/dL). Liver Function Tests: Recent Labs  Lab 01/08/25 1844 01/09/25 0544 01/13/25 0548  AST 20 17 19   ALT 14 11 8   ALKPHOS 77 68 81  BILITOT 0.3 0.2 0.3  PROT 7.2 6.6 6.8  ALBUMIN 3.9 3.5 3.3*   No results for input(s): LIPASE, AMYLASE in the last 168 hours. No results for input(s): AMMONIA in the last 168 hours. Coagulation Profile: No results for input(s): INR, PROTIME in the last 168 hours. Cardiac Enzymes: No results for input(s): CKTOTAL, CKMB, CKMBINDEX, TROPONINI in the last 168 hours. BNP (last 3 results) No results for input(s): PROBNP in the last 8760 hours. HbA1C: No results for input(s): HGBA1C in the last 72 hours.  CBG: Recent Labs  Lab 01/14/25 1130 01/14/25 1633 01/14/25 2101 01/15/25 0740 01/15/25 1117  GLUCAP 158* 108* 134* 149* 151*   Lipid Profile: No results for input(s): CHOL, HDL, LDLCALC, TRIG, CHOLHDL, LDLDIRECT in the last 72 hours. Thyroid  Function Tests: No results for input(s): TSH, T4TOTAL, FREET4, T3FREE, THYROIDAB in the last 72 hours. Anemia Panel: No results for input(s): VITAMINB12, FOLATE, FERRITIN, TIBC,  IRON, RETICCTPCT in the last 72 hours. Sepsis Labs: No results for input(s): PROCALCITON, LATICACIDVEN in the last 168 hours.  Recent Results (from the past 240 hours)  Urine Culture (for pregnant, neutropenic or urologic patients or patients with an indwelling urinary catheter)     Status: Abnormal   Collection Time: 01/08/25  9:08 PM   Specimen: Urine, Clean Catch  Result Value Ref Range Status   Specimen Description   Final    URINE, CLEAN CATCH Performed at [facility], [address] [place], [place] [address]    Special Requests   Final    NONE Performed at [facility], [address], [place], [place] [address]    Culture >=100,000 COLONIES/mL ESCHERICHIA COLI (A)  Final   Report Status 01/12/2025 FINAL  Final  Organism ID, Bacteria ESCHERICHIA COLI (A)  Final      Susceptibility   Escherichia coli - MIC*    AMPICILLIN  >=32 RESISTANT Resistant     CEFEPIME <=0.12 SENSITIVE Sensitive     ERTAPENEM <=0.12 SENSITIVE Sensitive     CEFTRIAXONE  1 SENSITIVE Sensitive     CIPROFLOXACIN  <=0.06 SENSITIVE Sensitive     GENTAMICIN <=1 SENSITIVE Sensitive     NITROFURANTOIN 32 SENSITIVE Sensitive     TRIMETH /SULFA  <=20 SENSITIVE Sensitive     AMPICILLIN /SULBACTAM >=32 RESISTANT Resistant     PIP/TAZO Value in next row Intermediate      64 INTERMEDIATEThis is a modified FDA-approved test that has been validated and its performance characteristics determined by the reporting laboratory.  This laboratory is certified under the Clinical Laboratory Improvement Amendments CLIA as qualified to perform high complexity clinical laboratory testing.    MEROPENEM Value in next row Sensitive      64 INTERMEDIATEThis is a modified FDA-approved test that has been validated and its performance characteristics determined by the reporting laboratory.  This laboratory is certified under the Clinical Laboratory Improvement Amendments CLIA as qualified to  perform high complexity clinical laboratory testing.    * >=100,000 COLONIES/mL ESCHERICHIA COLI         Radiology Studies: No results found.   Scheduled Meds:  acetaminophen   1,000 mg Oral TID   allopurinol   100 mg Oral Daily   amLODipine   10 mg Oral Daily   busPIRone   15 mg Oral BID   Influenza vac split trivalent PF  0.5 mL Intramuscular Tomorrow-1000   insulin  aspart  0-9 Units Subcutaneous TID AC & HS   insulin  glargine-yfgn  30 Units Subcutaneous QHS   lidocaine   3 patch Transdermal Q24H   pantoprazole   40 mg Oral BID   polyethylene glycol  17 g Oral BID   pravastatin   40 mg Oral Daily   sertraline   50 mg Oral Daily   Continuous Infusions:   LOS: 0 days    Almarie KANDICE Hoots, MD 01/15/2025, 12:38 PM   "

## 2025-01-15 NOTE — TOC Progression Note (Addendum)
 Transition of Care [facility]) - Progression Note    Patient Details  Name: Rebecca Walter MRN: [id number] Date of Birth: 12-27-1940  [facility]) CM/SW Contact  Heather DELENA Saltness, LCSW Phone Number: 01/15/2025, 2:24 PM  Clinical Narrative:     ADDENDUM  CSW spoke with Tammy at [facility] who advised facility has new NPI number and Medical Director. CSW resubmitted insurance request with new facility NPI: [id number]; and Medical Director: Cassondra Dollar, MD - NPI: [id number]. New authorization request was denied immediately, stating no certification number. CSW advised Tammy at [facility]. Tammy reports she will see if facility can submit insurance authorization on their end.  CSW saw pt's insurance authorization request has been denied on [employer] portal. No authorization/certification number listed. CSW attempted to speak with [place] Rep, [place] [phone], via phone call to inquire about certification number. No answer, voicemail left.   Expected Discharge Plan: Skilled Nursing Facility Barriers to Discharge: Continued Medical Work up   Expected Discharge Plan and Services   Discharge Planning Services: CM Consult   Living arrangements for the past 2 months: Apartment                 DME Arranged: N/A DME Agency: NA       HH Arranged: NA HH Agency: NA         Social Drivers of Health (SDOH) Interventions SDOH Screenings   Food Insecurity: No Food Insecurity (01/10/2025)  Housing: Low Risk (01/10/2025)  Transportation Needs: No Transportation Needs (01/10/2025)  Utilities: Not At Risk (01/10/2025)  Alcohol  Screen: Low Risk (05/08/2023)  Depression (PHQ2-9): Low Risk (05/23/2023)  Financial Resource Strain: Low Risk (05/08/2023)  Physical Activity: Inactive (05/08/2023)  Social Connections: Moderately Integrated (01/09/2025)  Stress: No Stress Concern Present (05/08/2023)  Tobacco Use: Low Risk (01/08/2025)    Readmission Risk Interventions     No data  to display         Signed: Heather Saltness, MSW, LCSW Clinical Social Worker Inpatient Care Management 01/15/2025 4:05 PM

## 2025-01-15 NOTE — Progress Notes (Signed)
 Occupational Therapy Treatment Patient Details Name: Rebecca Walter MRN: [id number] DOB: 1941/01/06 Today's Date: 01/15/2025   History of present illness 84 y.o. female admitted with fall from scooter that knocked her down while trying to plug in to charge on 01/08/25. Imaging showed subacute/chronic T11 fracture. Dx of UTI.   Pt with medical history significant of CKD, DM2, HTN, diastolic CHF, hypothyrodism, HLD, kidney stones.   OT comments  Pt in bed upon therapy arrival and agreeable to participate in OT treatment session focusing on bed mobility, functional mobility and transfers and standing balance/tolerance while completing self care tasks. Pt demonstrates improvement with bed mobility and functional mobility overall requiring less physical assist versus initial OT evaluation. Increased assistance was provided during ADL d/t to urine incontinence. Patient will benefit from continued inpatient follow up therapy, <3 hours/day.       If plan is discharge home, recommend the following:  Assistance with cooking/housework;Help with stairs or ramp for entrance;Assist for transportation;A little help with walking and/or transfers;A lot of help with bathing/dressing/bathroom   Equipment Recommendations  None recommended by OT       Precautions / Restrictions Precautions Precautions: Fall Recall of Precautions/Restrictions: Intact Restrictions Weight Bearing Restrictions Per Provider Order: No       Mobility Bed Mobility Overal bed mobility: Needs Assistance Bed Mobility: Supine to Sit     Supine to sit: Min assist, HOB elevated, Used rails     General bed mobility comments: Pt prefered occassional hand held assist to pull her trunk forward and hold onto while scooting hips towards EOB.    Transfers Overall transfer level: Needs assistance Equipment used: Rolling walker (2 wheels) Transfers: Sit to/from Stand, Bed to chair/wheelchair/BSC Sit to Stand: Contact guard assist      Step pivot transfers: Contact guard assist     General transfer comment: Due to limited shoulder ROM and pain, pt placed one hand on horizontal bar of RW versus handle. Able to carry over education regarding hand placement from previous session.     Balance Overall balance assessment: Needs assistance Sitting-balance support: Feet supported, No upper extremity supported Sitting balance-Leahy Scale: Good     Standing balance support: Bilateral upper extremity supported, Reliant on assistive device for balance Standing balance-Leahy Scale: Poor          ADL either performed or assessed with clinical judgement   ADL       Grooming: Oral care;Standing;Supervision/safety;Set up       Lower Body Bathing: Total assistance;Sit to/from stand       Lower Body Dressing: Maximal assistance;Sit to/from stand Lower Body Dressing Details (indicate cue type and reason): assist with donning/doffing shoes and socks. Pt able to stand and step into each leg hole of mesh undies while using RW for balance.    Functional mobility during ADLs: Contact guard assist;Rolling walker (2 wheels)                Communication Communication Communication: Impaired Factors Affecting Communication: Hearing impaired   Cognition Arousal: Alert Behavior During Therapy: WFL for tasks assessed/performed Cognition: No apparent impairments      Following commands: Intact        Cueing   Cueing Techniques: Verbal cues        General Comments Incontinent of urine upon standing with purewick dislodged. Provided assist to clean up urine and provide new purewick and mesh undies.    Pertinent Vitals/ Pain       Pain Assessment Pain Assessment: Faces  Faces Pain Scale: Hurts even more Pain Location: bilateral shoulders Pain Descriptors / Indicators: Aching, Constant, Grimacing, Discomfort Pain Intervention(s): Monitored during session, Limited activity within patient's tolerance, Other (comment)  (pain cream applied)         Frequency  Min 2X/week        Progress Toward Goals  OT Goals(current goals can now be found in the care plan section)  Progress towards OT goals: Progressing toward goals            AM-PAC OT 6 Clicks Daily Activity     Outcome Measure   Help from another person eating meals?: None Help from another person taking care of personal grooming?: None Help from another person toileting, which includes using toliet, bedpan, or urinal?: A Lot Help from another person bathing (including washing, rinsing, drying)?: A Lot Help from another person to put on and taking off regular upper body clothing?: A Little Help from another person to put on and taking off regular lower body clothing?: A Lot 6 Click Score: 17    End of Session Equipment Utilized During Treatment: Gait belt;Rolling walker (2 wheels)  OT Visit Diagnosis: Unsteadiness on feet (R26.81);Muscle weakness (generalized) (M62.81);History of falling (Z91.81);Pain Pain - Right/Left:  (bilateral) Pain - part of body: Shoulder   Activity Tolerance Patient tolerated treatment well   Patient Left in chair;with call bell/phone within reach;with chair alarm set   Nurse Communication Mobility status        Time: 1449-1531 OT Time Calculation (min): 42 min  Charges: OT General Charges $OT Visit: 1 Visit OT Treatments $Self Care/Home Management : 38-52 mins  Leita Howell, OTR/L,CBIS  Supplemental OT - [facility] and [facility] Secure Chat Preferred    Marketia Stallsmith, Leita BIRCH 01/15/2025, 4:12 PM

## 2025-01-16 DIAGNOSIS — N3000 Acute cystitis without hematuria: Secondary | ICD-10-CM | POA: Diagnosis not present

## 2025-01-16 DIAGNOSIS — M533 Sacrococcygeal disorders, not elsewhere classified: Secondary | ICD-10-CM | POA: Diagnosis not present

## 2025-01-16 LAB — GLUCOSE, CAPILLARY
Glucose-Capillary: 128 mg/dL — ABNORMAL HIGH (ref 70–99)
Glucose-Capillary: 172 mg/dL — ABNORMAL HIGH (ref 70–99)
Glucose-Capillary: 187 mg/dL — ABNORMAL HIGH (ref 70–99)
Glucose-Capillary: 178 mg/dL — ABNORMAL HIGH (ref 70–99)

## 2025-01-16 NOTE — Progress Notes (Signed)
 Physical Therapy Treatment Patient Details Name: Rebecca Walter MRN: [id number] DOB: [birth year] Today's Date: 01/16/2025   History of Present Illness 84 y.o. female admitted with fall from scooter that knocked her down while trying to plug in to charge on 01/08/25. Imaging showed subacute/chronic T11 fracture. Dx of UTI. Pt with bil shoulder pain with imaging revealing joint space narrowing, OA, osteophytes.  Pt with medical history significant of CKD, DM2, HTN, diastolic CHF, hypothyrodism, HLD, kidney stones.    PT Comments  In chair at arrival and was able to ambulate short distance with assist but remains fall risk and easily fatigued.  Bil shoulders (R worse than L) - painful today (chronic) and RN gave meds during session.  Received request from MD to evaluate for vestibular needs.  Pt did report some dizziness with return to supine and [facility] for R posterior canal vertigo - tolerated Epley maneuver well with use of bed features to assist with mobility.   Pt continues to require assist with transfers, fall risk, lives alone - continue to recommend Patient will benefit from continued inpatient follow up therapy, <3 hours/day at d/c.  May additionally need outpt vestibular PT f/u after SNF if still having vertigo.    If plan is discharge home, recommend the following: A lot of help with walking and/or transfers;A lot of help with bathing/dressing/bathroom;Assistance with cooking/housework;Assist for transportation;Help with stairs or ramp for entrance   Can travel by private vehicle     Yes  Equipment Recommendations  None recommended by PT    Recommendations for Other Services       Precautions / Restrictions Precautions Precautions: Fall     Mobility  Bed Mobility Overal bed mobility: Needs Assistance Bed Mobility: Sidelying to Sit, Sit to Sidelying   Sidelying to sit: Mod assist     Sit to sidelying: Min assist General bed mobility comments: Increased time, cues for  log roll technique for transitioning to EOB.    Transfers Overall transfer level: Needs assistance Equipment used: Rolling walker (2 wheels) Transfers: Sit to/from Stand, Bed to chair/wheelchair/BSC Sit to Stand: Min assist   Step pivot transfers: Min assist       General transfer comment: STS x 3 during session with cues for hand placement and min A to rise.  Min A for balance and RW with pivot    Ambulation/Gait Ambulation/Gait assistance: Min assist Gait Distance (Feet): 16 Feet Assistive device: Rolling walker (2 wheels) Gait Pattern/deviations: Step-to pattern, Decreased stride length, Shuffle, Trunk flexed Gait velocity: decreased     General Gait Details: Fatigued easily , chair follow , min A for balance and RW navigation, encouragement to do more if able   Stairs             Wheelchair Mobility     Tilt Bed    Modified Rankin (Stroke Patients Only)       Balance Overall balance assessment: Needs assistance Sitting-balance support: Feet supported, No upper extremity supported Sitting balance-Leahy Scale: Good     Standing balance support: Bilateral upper extremity supported, Reliant on assistive device for balance Standing balance-Leahy Scale: Poor Standing balance comment: Min assist with RW                            Communication Communication Factors Affecting Communication: Hearing impaired  Cognition Arousal: Alert Behavior During Therapy: WFL for tasks assessed/performed   PT - Cognitive impairments: No apparent impairments  Cueing    Exercises Other Exercises Other Exercises: Pt demonstrating LAQ and ankle pumps Other Exercises: R shoulder very painful with any movement - attempted to have pt work on ROM/stiffness via pendulums but too painful    General Comments   Vestibular Assessment:  Hx: MD request PT to evaluate for vestibular needs.  Nursing completed orthostatic  blood pressures earlier today and were negative.  Pt reports only dizziness she has is when she returns to supine has spinning that last a few seconds and she has to close her eyes.  She sleeps on her L side.  Reports this has been going on for a long time (months).    Symptoms consistent with BPPV did not assess oculormotor exam.  Shona Grebe Dix L: negative [address] [place] R: Positive with rotational nystagmus but pt closing eyes, symptoms lasted <10 seconds.  Treated with Epley maneuver.  Pt tolerated well in regards to dizziness, but some difficulty with positioning requiring use of bed features to assist pt into and out of positioning for BPPV testing.    Educated on BPPV (pt aware and reports treated in past but could not recall details).  Provided BPPV handout and discussed would continue to follow for therapy.      Pertinent Vitals/Pain Pain Assessment Pain Assessment: Faces Faces Pain Scale: Hurts whole lot Pain Location: bilateral shoulders with movement Pain Descriptors / Indicators: Aching, Constant, Grimacing, Discomfort Pain Intervention(s): Limited activity within patient's tolerance, Monitored during session, Patient requesting pain meds-RN notified, Premedicated before session (lidoncaine patch in place)    Home Living                          Prior Function            PT Goals (current goals can now be found in the care plan section) Progress towards PT goals: Progressing toward goals    Frequency    Min 2X/week      PT Plan      Co-evaluation              AM-PAC PT 6 Clicks Mobility   Outcome Measure  Help needed turning from your back to your side while in a flat bed without using bedrails?: A Little Help needed moving from lying on your back to sitting on the side of a flat bed without using bedrails?: A Lot Help needed moving to and from a bed to a chair (including a wheelchair)?: A Little Help needed standing up from a chair using your  arms (e.g., wheelchair or bedside chair)?: A Little Help needed to walk in hospital room?: Total (<20') Help needed climbing 3-5 steps with a railing? : Total 6 Click Score: 13    End of Session Equipment Utilized During Treatment: Gait belt Activity Tolerance: Patient tolerated treatment well;Patient limited by pain (Shoulder limited by pain otherwise tolerated) Patient left: in chair;with chair alarm set;with call bell/phone within reach Nurse Communication: Mobility status PT Visit Diagnosis: Difficulty in walking, not elsewhere classified (R26.2);BPPV BPPV - Right/Left : Right     Time: 1355-1433 PT Time Calculation (min) (ACUTE ONLY): 38 min  Charges:    $Gait Training: 8-22 mins $Therapeutic Activity: 8-22 mins $Canalith Rep Proc: 8-22 mins PT General Charges $$ ACUTE PT VISIT: 1 Visit                     Benjiman, PT Acute Rehab [employer] [phone]  Benjiman DEL Ailyne Pawley 01/16/2025, 2:45 PM

## 2025-01-16 NOTE — TOC Progression Note (Signed)
 Transition of Care [facility]) - Progression Note    Patient Details  Name: Afomia C Mehlhaff MRN: [id number] Date of Birth: 02-05-1941  Transition of Care [facility]) CM/SW Contact  Tawni CHRISTELLA Eva, LCSW Phone Number: 01/16/2025, 11:09 AM  Clinical Narrative:     CSW spoke with Tammy to inquired about insurance auth. Insurance auth for SNF is pending. ICM to follow.    Expected Discharge Plan: Skilled Nursing Facility Barriers to Discharge: Continued Medical Work up               Expected Discharge Plan and Services   Discharge Planning Services: CM Consult   Living arrangements for the past 2 months: Apartment                 DME Arranged: N/A DME Agency: NA       HH Arranged: NA HH Agency: NA         Social Drivers of Health (SDOH) Interventions SDOH Screenings   Food Insecurity: No Food Insecurity (01/10/2025)  Housing: Low Risk (01/10/2025)  Transportation Needs: No Transportation Needs (01/10/2025)  Utilities: Not At Risk (01/10/2025)  Alcohol  Screen: Low Risk (05/08/2023)  Depression (PHQ2-9): Low Risk (05/23/2023)  Financial Resource Strain: Low Risk (05/08/2023)  Physical Activity: Inactive (05/08/2023)  Social Connections: Moderately Integrated (01/09/2025)  Stress: No Stress Concern Present (05/08/2023)  Tobacco Use: Low Risk (01/08/2025)    Readmission Risk Interventions     No data to display

## 2025-01-16 NOTE — Progress Notes (Signed)
 " [facility] Progress Note  Patient: Rebecca Walter     FMW:[id number]  DOA: 01/08/2025   PCP: Leontine Cramp, NP       Brief hospital course: 84 year old female with type 2 diabetes CKD stage IIIa diastolic heart failure hyperlipidemia hypothyroidism admitted after mechanical fall where she was leaning on her motorized scooter and fell on her right side. She lives alone and was unable to ambulate in the ED and admitted to observation. She did not lose consciousness or hit her head she is on aspirin  daily. She accidentally pressed the gas instead of the brake while operating her mobile scooter inside the home causing her to fall forward and land on her right hip.   Subjective:  Has pain in her left shoulder, left lower back when she tries to move and then her left leg  Assessment and Plan: Principal Problem:   Sacral pain/fall - PT recommending SNF-insurance has denied SNF-continue to follow for appropriate disposition  Active Problems:  E. coli UTI - UTI may have caused generalized weakness - Finish ceftriaxone   Bilateral shoulder pain - Unable to lift both arms above her head - X-ray images reviewed patient has osteoarthritis with osteophytes - Continue pain control  T11 fracture - Subacute to chronic  CKD stage IIIa - Follow  Diabetes mellitus type 2 - Continue sliding scale insulin  and Semglee  - Hemoglobin A1c is 9.1  Hypertension, chronic HFpEF - Continue amlodipine  - Uses Lasix  only as needed  Obesity stage I Body mass index is 33.97 kg/m.        Code Status: Full Code Total time on patient care: 35 minutes DVT prophylaxis:  SCDs Start: 01/08/25 2036     Objective:   Vitals:   01/15/25 1919 01/16/25 0541 01/16/25 0905 01/16/25 1156  BP: (!) 106/56 (!) 137/50 (!) 149/56 (!) 137/56  Pulse: (!) 55 (!) 54 (!) 58 (!) 53  Resp: 15 20 16    Temp: 98.3 F (36.8 C) 98.2 F (36.8 C) 98.5 F (36.9 C) 97.9 F (36.6 C)  TempSrc:  Oral Oral Oral  SpO2:     98%  Weight:      Height:       Filed Weights   01/09/25 0306  Weight: 78.9 kg   Exam: General exam: Appears comfortable  HEENT: oral mucosa moist Respiratory system: Clear to auscultation.  Cardiovascular system: S1 & S2 heard  Gastrointestinal system: Abdomen soft, non-tender, nondistended. Normal bowel sounds   Extremities: No cyanosis, clubbing or edema Psychiatry:  Mood & affect appropriate.    CBC: Recent Labs  Lab 01/13/25 0548  WBC 7.4  HGB 11.7*  HCT 34.3*  MCV 88.6  PLT 207   Basic Metabolic Panel: Recent Labs  Lab 01/13/25 0548  NA 136  K 4.2  CL 107  CO2 17*  GLUCOSE 114*  BUN 27*  CREATININE 0.93  CALCIUM 9.7     Scheduled Meds:  acetaminophen   1,000 mg Oral TID   allopurinol   100 mg Oral Daily   amLODipine   10 mg Oral Daily   busPIRone   15 mg Oral BID   Influenza vac split trivalent PF  0.5 mL Intramuscular Tomorrow-1000   insulin  aspart  0-9 Units Subcutaneous TID AC & HS   insulin  glargine-yfgn  30 Units Subcutaneous QHS   lidocaine   3 patch Transdermal Q24H   pantoprazole   40 mg Oral BID   polyethylene glycol  17 g Oral BID   pravastatin   40 mg Oral Daily  sertraline   50 mg Oral Daily    Imaging and lab data personally reviewed   Author: Kewanda Poland  01/16/2025 6:34 PM  To contact [facility]>   Check the care team in [facility] and look for the attending/consulting TRH provider listed  Log into [email or web address] and use [place]'s universal password   Go to> [facility]  and find provider  If you still have difficulty reaching the provider, please page the [facility] (Director on Call) for the Hospitalists listed on amion     "

## 2025-01-16 NOTE — Plan of Care (Signed)

## 2025-01-17 ENCOUNTER — Observation Stay (HOSPITAL_COMMUNITY)

## 2025-01-17 DIAGNOSIS — M533 Sacrococcygeal disorders, not elsewhere classified: Secondary | ICD-10-CM | POA: Diagnosis not present

## 2025-01-17 LAB — GLUCOSE, CAPILLARY
Glucose-Capillary: 140 mg/dL — ABNORMAL HIGH (ref 70–99)
Glucose-Capillary: 239 mg/dL — ABNORMAL HIGH (ref 70–99)
Glucose-Capillary: 126 mg/dL — ABNORMAL HIGH (ref 70–99)
Glucose-Capillary: 136 mg/dL — ABNORMAL HIGH (ref 70–99)

## 2025-01-17 MED ORDER — POLYVINYL ALCOHOL 1.4 % OP SOLN
1.0000 [drp] | Freq: Two times a day (BID) | OPHTHALMIC | Status: DC
  Administered 2025-01-17: 2 [drp] via OPHTHALMIC
  Administered 2025-01-18: 1 [drp] via OPHTHALMIC
  Filled 2025-01-17 (×2): qty 15

## 2025-01-17 NOTE — TOC Progression Note (Addendum)
 Transition of Care [facility]) - Progression Note    Patient Details  Name: Rebecca Walter MRN: [id number] Date of Birth: 01-04-1941  Transition of Care [facility]) CM/SW Contact  Sonda Manuella Quill, RN Phone Number: 01/17/2025, 9:47 AM  Clinical Narrative:    Beatris w/ Madelin Jacobus, Admissions at [facility]; she said ins shara is still pending; she will also call when updated on status of ins auth; for any questions r/t ins auth please call her [phone].  -1257- notified by Madelin at [facility], peer to peer requested by 4 pm; the number is [phone]; Dr Earley notified via secure chat.   -1321- spoke w/ Tammy at [facility]; she clarified  peer to peer must be SCHEDULED within the next 4 hours ; Dr Earley notified via secure chat; she said she will call number given for peer to peer now.  -1332- notified by Dr Earley peer to peer completed; she said given ins shara # Shara [id number]  start 01/15/25 end 01/23/25, next review 01/24/25; Tammy at facility notified; she will verify ins auth.   -1411- notified by Tammy ins auth verified; per Dr Earley anticipate pt will be ready for d/c tomorrow; Tammy notified.  Expected Discharge Plan: Skilled Nursing Facility Barriers to Discharge: Continued Medical Work up               Expected Discharge Plan and Services   Discharge Planning Services: CM Consult   Living arrangements for the past 2 months: Apartment                 DME Arranged: N/A DME Agency: NA       HH Arranged: NA HH Agency: NA         Social Drivers of Health (SDOH) Interventions SDOH Screenings   Food Insecurity: No Food Insecurity (01/10/2025)  Housing: Low Risk (01/10/2025)  Transportation Needs: No Transportation Needs (01/10/2025)  Utilities: Not At Risk (01/10/2025)  Alcohol  Screen: Low Risk (05/08/2023)  Depression (PHQ2-9): Low Risk (05/23/2023)  Financial Resource Strain: Low Risk (05/08/2023)  Physical Activity: Inactive (05/08/2023)  Social Connections:  Moderately Integrated (01/09/2025)  Stress: No Stress Concern Present (05/08/2023)  Tobacco Use: Low Risk (01/08/2025)    Readmission Risk Interventions     No data to display

## 2025-01-17 NOTE — Plan of Care (Signed)

## 2025-01-17 NOTE — Progress Notes (Signed)
 " [facility] Progress Note  Patient: Rebecca Walter     FMW:[id number]  DOA: 01/08/2025   PCP: Leontine Cramp, NP       Brief hospital course: 84 year old female with type 2 diabetes CKD stage IIIa diastolic heart failure hyperlipidemia hypothyroidism admitted after mechanical fall where she was leaning on her motorized scooter and fell on her right side. She lives alone and was unable to ambulate in the ED and admitted to observation. She did not lose consciousness or hit her head she is on aspirin  daily. She accidentally pressed the gas instead of the brake while operating her mobile scooter inside the home causing her to fall forward and land on her right hip.   Subjective:  Continued pain in bilateral shoulders with right being worse than left.   Assessment and Plan: Principal Problem:   Sacral pain/fall - PT recommending SNF-insurance has denied SNF-continue to follow for appropriate disposition  Active Problems:  E. coli UTI - UTI may have caused generalized weakness - Finish ceftriaxone   Bilateral shoulder pain - Unable to lift both arms above her head - X-ray images reviewed patient has osteoarthritis with osteophytes - continued pain - will obtain MRI - Continue pain control with Hydrocodone  and Flexeril    T11 fracture - Subacute to chronic  CKD stage IIIa - Follow  Diabetes mellitus type 2 - Continue sliding scale insulin  and Semglee  - Hemoglobin A1c is 9.1  Hypertension, chronic HFpEF - Continue amlodipine  - Uses Lasix  only as needed  Obesity stage I Body mass index is 33.97 kg/m.        Code Status: Full Code Total time on patient care: 35 minutes DVT prophylaxis:  SCDs Start: 01/08/25 2036     Objective:   Vitals:   01/16/25 0905 01/16/25 1156 01/16/25 2030 01/17/25 0416  BP: (!) 149/56 (!) 137/56 (!) 133/57 (!) 143/53  Pulse: (!) 58 (!) 53 (!) 52 (!) 56  Resp: 16  12 20   Temp: 98.5 F (36.9 C) 97.9 F (36.6 C) 98 F (36.7 C) 97.9 F  (36.6 C)  TempSrc: Oral Oral Oral Oral  SpO2:  98% 95% 95%  Weight:      Height:       Filed Weights   01/09/25 0306  Weight: 78.9 kg   Exam: General exam: Appears comfortable  HEENT: oral mucosa moist Respiratory system: Clear to auscultation.  Cardiovascular system: S1 & S2 heard  Gastrointestinal system: Abdomen soft, non-tender, nondistended. Normal bowel sounds   Extremities: No cyanosis, clubbing or edema- tenderness in bilateral shoulders- unable to lift arms above 90 degrees.  Psychiatry:  Mood & affect appropriate.    CBC: Recent Labs  Lab 01/13/25 0548  WBC 7.4  HGB 11.7*  HCT 34.3*  MCV 88.6  PLT 207   Basic Metabolic Panel: Recent Labs  Lab 01/13/25 0548  NA 136  K 4.2  CL 107  CO2 17*  GLUCOSE 114*  BUN 27*  CREATININE 0.93  CALCIUM 9.7     Scheduled Meds:  acetaminophen   1,000 mg Oral TID   allopurinol   100 mg Oral Daily   amLODipine   10 mg Oral Daily   busPIRone   15 mg Oral BID   Influenza vac split trivalent PF  0.5 mL Intramuscular Tomorrow-1000   insulin  aspart  0-9 Units Subcutaneous TID AC & HS   insulin  glargine-yfgn  30 Units Subcutaneous QHS   lidocaine   3 patch Transdermal Q24H   pantoprazole   40 mg Oral BID  polyethylene glycol  17 g Oral BID   pravastatin   40 mg Oral Daily   sertraline   50 mg Oral Daily    Imaging and lab data personally reviewed   Author: Higinio Grow  01/17/2025 1:23 PM  To contact [facility]>   Check the care team in [facility] and look for the attending/consulting TRH provider listed  Log into [email or web address] and use [place]'s universal password   Go to> [facility]  and find provider  If you still have difficulty reaching the provider, please page the [facility] (Director on Call) for the Hospitalists listed on amion     "

## 2025-01-17 NOTE — Progress Notes (Signed)
 Occupational Therapy Treatment Patient Details Name: Rebecca Walter MRN: [id number] DOB: 1941-04-27 Today's Date: 01/17/2025   History of present illness 84 yr old female admitted with fall from scooter that knocked her down while trying to plug in to charge on 01/08/25. Imaging showed subacute/chronic T11 fracture. Also found to have of UTI. Pt with bilateral shoulder pain with imaging revealing joint space narrowing, OA, osteophytes.  Pt with medical history significant of CKD, DM2, HTN, diastolic CHF, hypothyrodism, HLD, kidney stones.   OT comments  The pt progressed to performing toileting management at bathroom level and grooming in standing at the sink. She required mod assist for toileting management and CGA for grooming. She reported having chronic R shoulder ROM limitations and pain; as such, she needed to use her LUE to perform hair combing seated in the chair. She reported fatigue with activity. Continue OT plan of care. Patient will benefit from continued inpatient follow up therapy, <3 hours/day.       If plan is discharge home, recommend the following:  Assistance with cooking/housework;Help with stairs or ramp for entrance;Assist for transportation;A little help with walking and/or transfers;A lot of help with bathing/dressing/bathroom   Equipment Recommendations  None recommended by OT    Recommendations for Other Services      Precautions / Restrictions Precautions Precautions: Fall Restrictions Weight Bearing Restrictions Per Provider Order: No       Mobility Bed Mobility               General bed mobility comments: pt was received seated in the chair    Transfers Overall transfer level: Needs assistance Equipment used: Rolling walker (2 wheels) Transfers: Sit to/from Stand Sit to Stand: Min assist                 Balance       Sitting balance - Comments: static sitting-good. dynamic sitting-fair+       Standing balance comment: CGA to min  assist with RW            ADL either performed or assessed with clinical judgement   ADL Overall ADL's : Needs assistance/impaired     Grooming: Contact guard assist;Standing;Cueing for safety Grooming Details (indicate cue type and reason): The pt required light steadying assist to perform hand washing standing at the sink. She was instructed to step closer to the sink for added safety during standing tasks.             Lower Body Dressing: Moderate assistance;Sitting/lateral leans;Sit to/from stand Lower Body Dressing Details (indicate cue type and reason): The pt required assist to donn her underwear over her ankles and to her knees in sitting, then CGA for steadying while she pulled them up over her hips. Toilet Transfer: Minimal assistance;Rolling walker (2 wheels);Ambulation;Grab bars Toilet Transfer Details (indicate cue type and reason): The pt ambulated to & from the bathroom in her room using a RW. She required light cues for walker placement and use of grab bar as needed. Toileting- Clothing Manipulation and Hygiene: Moderate assistance;Minimal assistance;Sit to/from stand Toileting - Clothing Manipulation Details (indicate cue type and reason): She required steadying assist in standing, moderate assist for clothing management, and CGA in standing while she performed anterior peri-hygiene.             Communication Communication Factors Affecting Communication: Hearing impaired   Cognition Arousal: Alert Behavior During Therapy: WFL for tasks assessed/performed            Following commands:  Intact        Cueing   Cueing Techniques: Verbal cues             Pertinent Vitals/ Pain       Pain Assessment Pain Assessment: 0-10 Pain Score: 4  Pain Location: R shoulder Pain Intervention(s): Limited activity within patient's tolerance, Monitored during session   Frequency  Min 2X/week        Progress Toward Goals  OT Goals(current goals can now  be found in the care plan section)  Progress towards OT goals: Progressing toward goals  Acute Rehab OT Goals Patient Stated Goal: to get better & return home from rehab OT Goal Formulation: With patient Time For Goal Achievement: 01/23/25 Potential to Achieve Goals: Good  Plan         AM-PAC OT 6 Clicks Daily Activity     Outcome Measure   Help from another person eating meals?: None Help from another person taking care of personal grooming?: A Little Help from another person toileting, which includes using toliet, bedpan, or urinal?: A Lot Help from another person bathing (including washing, rinsing, drying)?: A Lot Help from another person to put on and taking off regular upper body clothing?: A Little Help from another person to put on and taking off regular lower body clothing?: A Lot 6 Click Score: 16    End of Session Equipment Utilized During Treatment: Gait belt;Rolling walker (2 wheels)  OT Visit Diagnosis: Unsteadiness on feet (R26.81);Muscle weakness (generalized) (M62.81);History of falling (Z91.81);Pain Pain - Right/Left: Right Pain - part of body: Shoulder   Activity Tolerance Patient limited by pain   Patient Left in chair;with call bell/phone within reach;with chair alarm set   Nurse Communication Mobility status        Time: 8541-8479 OT Time Calculation (min): 22 min  Charges: OT General Charges $OT Visit: 1 Visit OT Treatments $Self Care/Home Management : 8-22 mins    [name], OTR/L 01/17/2025, 4:57 PM

## 2025-01-18 DIAGNOSIS — N3000 Acute cystitis without hematuria: Secondary | ICD-10-CM

## 2025-01-18 DIAGNOSIS — N390 Urinary tract infection, site not specified: Secondary | ICD-10-CM

## 2025-01-18 LAB — GLUCOSE, CAPILLARY
Glucose-Capillary: 148 mg/dL — ABNORMAL HIGH (ref 70–99)
Glucose-Capillary: 153 mg/dL — ABNORMAL HIGH (ref 70–99)
Glucose-Capillary: 178 mg/dL — ABNORMAL HIGH (ref 70–99)

## 2025-01-18 MED ORDER — HYDROCODONE-ACETAMINOPHEN 5-325 MG PO TABS: 1.0000 | ORAL_TABLET | ORAL | 0 refills | Status: DC | PRN

## 2025-01-18 NOTE — TOC Progression Note (Signed)
 Transition of Care [facility]) - Progression Note    Patient Details  Name: Rebecca Walter MRN: [id number] Date of Birth: August 17, 1941  Transition of Care [facility] CM/SW Contact  Sonda Manuella Quill, RN Phone Number: 01/18/2025, 9:31 AM  Clinical Narrative:    Pt ready for d/c; notified Madelin Jacobus, Admissions at [facility]; awaiting d/c summary before room and call report #s given.   Expected Discharge Plan: Skilled Nursing Facility Barriers to Discharge: Continued Medical Work up               Expected Discharge Plan and Services   Discharge Planning Services: CM Consult   Living arrangements for the past 2 months: Apartment                 DME Arranged: N/A DME Agency: NA       HH Arranged: NA HH Agency: NA         Social Drivers of Health (SDOH) Interventions SDOH Screenings   Food Insecurity: No Food Insecurity (01/10/2025)  Housing: Low Risk (01/10/2025)  Transportation Needs: No Transportation Needs (01/10/2025)  Utilities: Not At Risk (01/10/2025)  Alcohol  Screen: Low Risk (05/08/2023)  Depression (PHQ2-9): Low Risk (05/23/2023)  Financial Resource Strain: Low Risk (05/08/2023)  Physical Activity: Inactive (05/08/2023)  Social Connections: Moderately Integrated (01/09/2025)  Stress: No Stress Concern Present (05/08/2023)  Tobacco Use: Low Risk (01/08/2025)    Readmission Risk Interventions     No data to display

## 2025-01-18 NOTE — Progress Notes (Signed)
 RN called [facility], report give to [facility] an [facility]. Patient awaiting [facility] for transport.

## 2025-01-18 NOTE — Plan of Care (Signed)
   Problem: Education: Goal: Ability to describe self-care measures that may prevent or decrease complications (Diabetes Survival Skills Education) will improve Outcome: Progressing Goal: Individualized Educational Video(s) Outcome: Progressing   Problem: Coping: Goal: Ability to adjust to condition or change in health will improve Outcome: Progressing

## 2025-01-18 NOTE — TOC Transition Note (Addendum)
 Transition of Care [facility]) - Discharge Note   Patient Details  Name: Rebecca Walter MRN: [id number] Date of Birth: 01/08/1941  [facility]) CM/SW Contact:  Sonda Manuella Quill, RN Phone Number: 01/18/2025, 2:08 PM   Clinical Narrative:    D/C orders received; spoke w/ Madelin Jacobus at [facility]; she gave RM # [phone] and call report # [phone]; transport by [facility]; pt and daughter-in-law Lonell Daring )on speaker phone) notified and agreed to d/c plan; [facility] called for transport at 1411; spoke w/ Dick; d/c summary and SNF transfer report sent via hub; no IP CM needs.   Final next level of care: Skilled Nursing Facility Barriers to Discharge: No Barriers Identified   Patient Goals and CMS Choice Patient states their goals for this hospitalization and ongoing recovery are:: short-term rehab     [place] ownership interest in [facility].provided to:: Patient    Discharge Placement              Patient chooses bed at: Other - please specify in the comment section below: [facility]) Patient to be transferred to facility by: PT[facility] Name of family member notified: Lonell Daring (daughter-in-law) on speaker phone Patient and family notified of of transfer: 01/18/25  Discharge Plan and Services Additional resources added to the After Visit Summary for     Discharge Planning Services: CM Consult            DME Arranged: N/A DME Agency: NA       HH Arranged: NA HH Agency: NA        Social Drivers of Health (SDOH) Interventions SDOH Screenings   Food Insecurity: No Food Insecurity (01/10/2025)  Housing: Low Risk (01/10/2025)  Transportation Needs: No Transportation Needs (01/10/2025)  Utilities: Not At Risk (01/10/2025)  Alcohol  Screen: Low Risk (05/08/2023)  Depression (PHQ2-9): Low Risk (05/23/2023)  Financial Resource Strain: Low Risk (05/08/2023)  Physical Activity: Inactive (05/08/2023)  Social Connections: Moderately Integrated (01/09/2025)  Stress:  No Stress Concern Present (05/08/2023)  Tobacco Use: Low Risk (01/08/2025)     Readmission Risk Interventions     No data to display

## 2025-02-07 ENCOUNTER — Ambulatory Visit: Admitting: Orthopaedic Surgery
